# Patient Record
Sex: Male | Born: 1950 | Race: White | Hispanic: No | Marital: Single | Smoking: Current every day smoker
Health system: Southern US, Community
[De-identification: ages and names within clinical notes are randomized; demographics above are authoritative.]

## PROBLEM LIST (undated history)

## (undated) DIAGNOSIS — J45909 Unspecified asthma, uncomplicated: Secondary | ICD-10-CM

## (undated) DIAGNOSIS — C189 Malignant neoplasm of colon, unspecified: Secondary | ICD-10-CM

## (undated) DIAGNOSIS — J449 Chronic obstructive pulmonary disease, unspecified: Secondary | ICD-10-CM

## (undated) DIAGNOSIS — E785 Hyperlipidemia, unspecified: Secondary | ICD-10-CM

## (undated) HISTORY — DX: Malignant neoplasm of colon, unspecified: C18.9

---

## 2010-06-17 ENCOUNTER — Ambulatory Visit: Payer: Self-pay | Admitting: Cardiovascular Disease

## 2012-08-18 ENCOUNTER — Emergency Department: Payer: Self-pay | Admitting: Emergency Medicine

## 2012-08-18 LAB — COMPREHENSIVE METABOLIC PANEL
Albumin: 4.1 g/dL (ref 3.4–5.0)
Alkaline Phosphatase: 88 U/L (ref 50–136)
Anion Gap: 8 (ref 7–16)
BUN: 18 mg/dL (ref 7–18)
Bilirubin,Total: 0.4 mg/dL (ref 0.2–1.0)
Calcium, Total: 9 mg/dL (ref 8.5–10.1)
Chloride: 104 mmol/L (ref 98–107)
Co2: 27 mmol/L (ref 21–32)
Creatinine: 0.82 mg/dL (ref 0.60–1.30)
EGFR (African American): >60
EGFR (Non-African Amer.): >60
Glucose: 68 mg/dL (ref 65–99)
Osmolality: 278 (ref 275–301)
Potassium: 3.7 mmol/L (ref 3.5–5.1)
SGOT(AST): 36 U/L (ref 15–37)
SGPT (ALT): 51 U/L (ref 12–78)
Sodium: 139 mmol/L (ref 136–145)
Total Protein: 8 g/dL (ref 6.4–8.2)

## 2012-08-18 LAB — CBC
HCT: 45.4 % (ref 40.0–52.0)
HGB: 16.1 g/dL (ref 13.0–18.0)
MCH: 31.3 pg (ref 26.0–34.0)
MCHC: 35.5 g/dL (ref 32.0–36.0)
MCV: 88 fL (ref 80–100)
Platelet: 192 10*3/uL (ref 150–440)
RBC: 5.15 10*6/uL (ref 4.40–5.90)
RDW: 14.4 % (ref 11.5–14.5)
WBC: 7.5 10*3/uL (ref 3.8–10.6)

## 2012-08-31 ENCOUNTER — Emergency Department: Payer: Self-pay | Admitting: Emergency Medicine

## 2012-09-14 ENCOUNTER — Ambulatory Visit: Payer: Self-pay | Admitting: Neurology

## 2013-05-24 ENCOUNTER — Emergency Department: Payer: Self-pay | Admitting: Internal Medicine

## 2015-12-02 ENCOUNTER — Emergency Department
Admission: EM | Admit: 2015-12-02 | Discharge: 2015-12-02 | Disposition: A | Discharge status: Home / Self Care | Payer: Medicare Other | Attending: Emergency Medicine | Admitting: Emergency Medicine

## 2015-12-02 ENCOUNTER — Encounter: Payer: Self-pay | Admitting: Emergency Medicine

## 2015-12-02 DIAGNOSIS — Z970 Presence of artificial eye: Secondary | ICD-10-CM | POA: Insufficient documentation

## 2015-12-02 DIAGNOSIS — R51 Headache: Secondary | ICD-10-CM | POA: Diagnosis present

## 2015-12-02 DIAGNOSIS — H5441 Blindness, right eye, normal vision left eye: Secondary | ICD-10-CM | POA: Insufficient documentation

## 2015-12-02 DIAGNOSIS — F17200 Nicotine dependence, unspecified, uncomplicated: Secondary | ICD-10-CM | POA: Diagnosis not present

## 2015-12-02 DIAGNOSIS — R519 Headache, unspecified: Secondary | ICD-10-CM

## 2015-12-02 HISTORY — DX: Hyperlipidemia, unspecified: E78.5

## 2015-12-02 LAB — SEDIMENTATION RATE: Sed Rate: 5 mm/hr (ref 0–20)

## 2015-12-02 LAB — ACETAMINOPHEN LEVEL: Acetaminophen (Tylenol), Serum: <10 ug/mL — ABNORMAL LOW (ref 10–30)

## 2015-12-02 MED ORDER — SODIUM CHLORIDE 0.9 % IV BOLUS (SEPSIS)
1000.0000 mL | Freq: Once | INTRAVENOUS | Status: AC
  Administered 2015-12-02: 1000 mL via INTRAVENOUS

## 2015-12-02 MED ORDER — PROCHLORPERAZINE EDISYLATE 5 MG/ML IJ SOLN
INTRAMUSCULAR | Status: AC
  Administered 2015-12-02: 10 mg via INTRAVENOUS
  Filled 2015-12-02: qty 2

## 2015-12-02 MED ORDER — PROCHLORPERAZINE EDISYLATE 5 MG/ML IJ SOLN
10.0000 mg | Freq: Once | INTRAMUSCULAR | Status: AC
  Administered 2015-12-02: 10 mg via INTRAVENOUS

## 2015-12-02 MED ORDER — KETOROLAC TROMETHAMINE 30 MG/ML IJ SOLN
30.0000 mg | Freq: Once | INTRAMUSCULAR | Status: AC
  Administered 2015-12-02: 30 mg via INTRAVENOUS

## 2015-12-02 MED ORDER — SUMATRIPTAN SUCCINATE 25 MG PO TABS: 25.0000 mg | ORAL_TABLET | ORAL | Status: DC | PRN

## 2015-12-02 MED ORDER — KETOROLAC TROMETHAMINE 30 MG/ML IJ SOLN
INTRAMUSCULAR | Status: AC
  Administered 2015-12-02: 30 mg via INTRAVENOUS
  Filled 2015-12-02: qty 1

## 2015-12-02 NOTE — ED Provider Notes (Signed)
[facility] Emergency Department Provider Note  ____________________________________________  Time seen: Approximately 4 PM  I have reviewed the triage vital signs and the nursing notes.   HISTORY  Chief Complaint Headache    HPI Eric Simon is a 64 y.o. male with a history of hyperlipidemia who is presenting today with a headache over the past 3 months. He says that the headache is a 10 out of 10 at this time and has been persistent. He says it is frontal and radiates to the back of his head. He said that he is already been worked up previously for this and his primary care doctors and had a CAT scan which she said was "normal." He denies any weakness or numbness. 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 He says that he also has some pain when he moves his jaw on both sides. Says that he smokes infrequently and his doctor thinks he may have a history of cluster headaches or migraines. He says his mother had a history of migraines that she developed later in life. There was no mention of a history of brain cancer. No nausea or vomiting. No pain along the temples.Says that the pain is sharp.   Past Medical History  Diagnosis Date  . Hyperlipidemia     There are no active problems to display for this patient.   History reviewed. No pertinent past surgical history.  No current outpatient prescriptions on file.  Allergies Review of patient's allergies indicates no known allergies.  History reviewed. No pertinent family history.  Social History Social History  Substance Use Topics  . Smoking status: Current Every Day Smoker  . Smokeless tobacco: None  . Alcohol Use: No    Review of Systems Constitutional: No fever/chills Eyes: No visual changes. ENT: No sore throat. Cardiovascular: Denies chest pain. Respiratory: Denies  shortness of breath. Gastrointestinal: No abdominal pain.  No nausea, no vomiting.  No diarrhea.  No constipation. Genitourinary: Negative for dysuria. Musculoskeletal: Negative for back pain. Skin: Negative for rash. Neurological: Negative for focal weakness or numbness.  10-point ROS otherwise negative.  ____________________________________________   PHYSICAL EXAM:  VITAL SIGNS: ED Triage Vitals  Enc Vitals Group     BP 12/02/15 1047 147/81 mmHg     Pulse Rate 12/02/15 1047 68     Resp 12/02/15 1047 20     Temp 12/02/15 1047 97.9 F (36.6 C)     Temp Source 12/02/15 1047 Oral     SpO2 12/02/15 1047 97 %     Weight 12/02/15 1047 161 lb (73.029 kg)     Height 12/02/15 1047 5\' 7"  (1.702 m)     Head Cir --      Peak Flow --      Pain Score 12/02/15 1047 10     Pain Loc --      Pain Edu? --      Excl. in [place]? --     Constitutional: Alert and oriented. Well appearing and in no acute distress. Eyes: Conjunctivae are normal. Right eye with prosthetic but the left eye with intact pupillary reflexes as well as extraocular muscles. Head: Atraumatic. Nose: No congestion/rhinnorhea. Mouth/Throat: Mucous membranes are moist.   Neck: No stridor.  Able to range freely without any meningismus. Cardiovascular: Normal rate, regular rhythm. Grossly normal heart sounds.  Good peripheral circulation. Respiratory: Normal  respiratory effort.  No retractions. Lungs CTAB. Gastrointestinal: Soft and nontender. No distention. No abdominal bruits. No CVA tenderness. Musculoskeletal: No lower extremity tenderness nor edema.  No joint effusions. Neurologic:  Normal speech and language. No gross focal neurologic deficits are appreciated. No gait instability. No tenderness to palpation or nodularity along the distribution of the temporal arteries. Able to range his jaw fully without any pain at this time. Skin:  Skin is warm, dry and intact. No rash noted. Psychiatric: Mood and affect are normal.  Speech and behavior are normal.  ____________________________________________   LABS (all labs ordered are listed, but only abnormal results are displayed)  Labs Reviewed  ACETAMINOPHEN LEVEL - Abnormal; Notable for the following:    Acetaminophen (Tylenol), Serum <10 (*)    All other components within normal limits  SEDIMENTATION RATE   ____________________________________________  EKG   ____________________________________________  RADIOLOGY   ____________________________________________   PROCEDURES   ____________________________________________   INITIAL IMPRESSION / ASSESSMENT AND PLAN / ED COURSE  Pertinent labs & imaging results that were available during my care of the patient were reviewed by me and considered in my medical decision making (see chart for details).  ----------------------------------------- 5:26 PM on 12/02/2015 -----------------------------------------  Patient has headache now that is only a 5 out of 10 and says he is feeling much more comfortable. He says the headache is only bad when he coughs at this point. Normal labs reassuring for temporal arteritis. Tylenol was checked because the patient wasn't using a lot of fears that at home without any relief. We'll discharge with Imitrex. Patient up with his primary care doctor. Given recent CAT scan with "normal result "decided not to scan the patient secondary to radiation exposure. Also low suspicion for internal hemorrhage at this time given the duration of the symptoms. Patient was well this time. Understands plan for follow-up and is one to comply. ____________________________________________   FINAL CLINICAL IMPRESSION(S) / ED DIAGNOSES  Acute headache.    Orbie Pyo, MD 12/02/15 [phone]

## 2015-12-02 NOTE — Discharge Instructions (Signed)

## 2015-12-02 NOTE — ED Notes (Signed)
Pt to ed with c/o headache x 2 months.  Pt states he has been seen by md for same but no relief.  Pt reports taking rx for same without relief. Pt reports he is unable to sleep due to the pain.  Denies n/v.  Pt is alert and oriented at this time.

## 2015-12-02 NOTE — ED Notes (Signed)
Pt reports headaches for then last 3 months, pt denies any other symptoms, pt reports pain is worse over the last week

## 2016-02-18 ENCOUNTER — Other Ambulatory Visit: Payer: Self-pay | Admitting: Neurology

## 2016-02-18 DIAGNOSIS — R519 Headache, unspecified: Secondary | ICD-10-CM

## 2016-02-18 DIAGNOSIS — R51 Headache: Principal | ICD-10-CM

## 2016-03-10 ENCOUNTER — Ambulatory Visit
Admission: RE | Admit: 2016-03-10 | Discharge: 2016-03-10 | Disposition: A | Discharge status: Home / Self Care | Payer: Medicare Other | Source: Ambulatory Visit | Attending: Neurology | Admitting: Neurology

## 2016-03-10 DIAGNOSIS — R51 Headache: Secondary | ICD-10-CM | POA: Diagnosis present

## 2016-03-10 DIAGNOSIS — R519 Headache, unspecified: Secondary | ICD-10-CM

## 2016-03-10 DIAGNOSIS — R9082 White matter disease, unspecified: Secondary | ICD-10-CM | POA: Insufficient documentation

## 2016-03-10 MED ORDER — GADOBENATE DIMEGLUMINE 529 MG/ML IV SOLN
15.0000 mL | Freq: Once | INTRAVENOUS | Status: AC | PRN
  Administered 2016-03-10: 15 mL via INTRAVENOUS

## 2016-12-14 ENCOUNTER — Encounter: Payer: Self-pay | Admitting: Emergency Medicine

## 2016-12-14 ENCOUNTER — Emergency Department
Admission: EM | Admit: 2016-12-14 | Discharge: 2016-12-14 | Disposition: A | Discharge status: Home / Self Care | Payer: Medicare Other | Attending: Emergency Medicine | Admitting: Emergency Medicine

## 2016-12-14 DIAGNOSIS — F17200 Nicotine dependence, unspecified, uncomplicated: Secondary | ICD-10-CM | POA: Diagnosis not present

## 2016-12-14 DIAGNOSIS — J449 Chronic obstructive pulmonary disease, unspecified: Secondary | ICD-10-CM | POA: Diagnosis not present

## 2016-12-14 DIAGNOSIS — J0100 Acute maxillary sinusitis, unspecified: Secondary | ICD-10-CM | POA: Diagnosis not present

## 2016-12-14 DIAGNOSIS — J301 Allergic rhinitis due to pollen: Secondary | ICD-10-CM

## 2016-12-14 DIAGNOSIS — R51 Headache: Secondary | ICD-10-CM | POA: Diagnosis present

## 2016-12-14 HISTORY — DX: Chronic obstructive pulmonary disease, unspecified: J44.9

## 2016-12-14 HISTORY — DX: Unspecified asthma, uncomplicated: J45.909

## 2016-12-14 MED ORDER — CETIRIZINE HCL 10 MG PO TABS: 10.0000 mg | ORAL_TABLET | Freq: Every day | ORAL | 0 refills | Status: DC

## 2016-12-14 MED ORDER — AMOXICILLIN 875 MG PO TABS: 875.0000 mg | ORAL_TABLET | Freq: Two times a day (BID) | ORAL | 0 refills | Status: DC

## 2016-12-14 NOTE — ED Triage Notes (Signed)
Pt to ed with c/o headache intermittent x 1 month.  Pt states worse with cough.  Reports congestion, and facial pain.

## 2016-12-14 NOTE — ED Notes (Signed)
See triage note  Having intermittent headaches for several weeks  But now having cough,congestion and pain to face area  Afebrile on arrival

## 2016-12-14 NOTE — ED Provider Notes (Signed)
[facility] Emergency Department Provider Note  ____________________________________________  Time seen: Approximately 11:13 AM  I have reviewed the triage vital signs and the nursing notes.   HISTORY  Chief Complaint Facial Pain    HPI Eric Simon is a 66 y.o. male , NAD, presents to emergency room with one-week history of sinus pain and pressure. States he has chronic issues with his sinuses and allergic rhinitis. Is taking Flonase and Allegra daily but he states he doesn't believe it is been helping. Has had sinus headache, clear nasal drainage and pressure for approximately one month that seemed to worsen over the last week and accompanied by increased thick nasal drainage. States that the headache is not the worst headache of his life and did not have a thunderclap presentation. Denies fevers, chills or body aches. Has had no visual changes, eye discharge, ear pain or drainage nor sore throat. Denies chest pain, shortness breath, cough or chest congestion. Has had no numbness, weakness, tingling.   Past Medical History:  Diagnosis Date  . Asthma   . COPD (chronic obstructive pulmonary disease) ([place])   . Hyperlipidemia     There are no active problems to display for this patient.   History reviewed. No pertinent surgical history.  Prior to Admission medications   Medication Sig Start Date End Date Taking? Authorizing Provider  amoxicillin (AMOXIL) 875 MG tablet Take 1 tablet (875 mg total) by mouth 2 (two) times daily. 12/14/16   Jami L Hagler, PA-C  cetirizine (ZYRTEC) 10 MG tablet Take 1 tablet (10 mg total) by mouth daily. 12/14/16   Jami L Hagler, PA-C  SUMAtriptan (IMITREX) 25 MG tablet Take 1 tablet (25 mg total) by mouth every 2 (two) hours as needed for migraine. May repeat in 2 hours if headache persists or recurs. 12/02/15   Orbie Pyo, MD    Allergies Patient has no known allergies.  History reviewed. No pertinent family  history.  Social History Social History  Substance Use Topics  . Smoking status: Current Every Day Smoker  . Smokeless tobacco: Never Used  . Alcohol use No     Review of Systems  Constitutional: No fever/chills, Fatigue Eyes: No visual changes. No discharge ENT: Positive sinus pressure, nasal congestion. No sore throat, Ear pain, ear drainage. Cardiovascular: No chest pain. Respiratory: No cough, Chest congestion. No shortness of breath. No wheezing.  Gastrointestinal: No abdominal pain.  No nausea, vomiting.   Musculoskeletal: Negative for General myalgias.  Skin: Negative for rash. Neurological: Positive for sinus headaches, but no focal weakness or numbness. 10-point ROS otherwise negative.  ____________________________________________   PHYSICAL EXAM:  VITAL SIGNS: ED Triage Vitals  Enc Vitals Group     BP 12/14/16 1013 (!) 143/81     Pulse Rate 12/14/16 1013 80     Resp 12/14/16 1013 20     Temp 12/14/16 1013 97.9 F (36.6 C)     Temp Source 12/14/16 1013 Oral     SpO2 12/14/16 1013 98 %     Weight 12/14/16 1014 161 lb (73 kg)     Height --      Head Circumference --      Peak Flow --      Pain Score 12/14/16 1014 7     Pain Loc --      Pain Edu? --      Excl. in [place]? --      Constitutional: Alert and oriented. Well appearing and in no acute distress.  Eyes: Conjunctivae are normal Without icterus or injection. Head: Atraumatic. Tenderness to percussion of bilateral maxillary sinuses. ENT:      Ears: TMs visualized bilaterally with mild bulging but no serous effusion, perforation or erythema.      Nose: Moderate congestion with clear rhinorrhea.      Mouth/Throat: Mucous membranes are moist. Pharynx without erythema, swelling, exudate. Uvula is midline. Airway is patent. White postnasal drip. Neck: Supple with full range of motion. Hematological/Lymphatic/Immunilogical: No cervical lymphadenopathy. Cardiovascular: Normal rate, regular rhythm. Normal S1  and S2.  Good peripheral circulation. Respiratory: Normal respiratory effort without tachypnea or retractions. Lungs CTAB with breath sounds noted in all lung fields. No wheeze, rhonchi, rales Neurologic:  Normal speech and language. No gross focal neurologic deficits are appreciated.  Skin:  Skin is warm, dry and intact. No rash noted. Psychiatric: Mood and affect are normal. Speech and behavior are normal. Patient exhibits appropriate insight and judgement.   ____________________________________________   LABS  None ____________________________________________  EKG  None ____________________________________________  RADIOLOGY  None ____________________________________________    PROCEDURES  Procedure(s) performed: None   Procedures   Medications - No data to display   ____________________________________________   INITIAL IMPRESSION / ASSESSMENT AND PLAN / ED COURSE  Pertinent labs & imaging results that were available during my care of the patient were reviewed by me and considered in my medical decision making (see chart for details).  Clinical Course     Patient's diagnosis is consistent with Acute recurrent maxillary sinusitis due to chronic seasonal allergic rhinitis. Patient will be discharged home with prescriptions for amoxicillin and Zyrtec to take as directed. Patient is to continue Flonase as previously prescribed. He may discontinue use of Allegra while on the Zyrtec. Patient is to follow up with his primary care provider if symptoms persist past this treatment course. Patient is given ED precautions to return to the ED for any worsening or new symptoms.    ____________________________________________  FINAL CLINICAL IMPRESSION(S) / ED DIAGNOSES  Final diagnoses:  Acute non-recurrent maxillary sinusitis  Chronic seasonal allergic rhinitis due to pollen      NEW MEDICATIONS STARTED DURING THIS VISIT:  Discharge Medication List as of  12/14/2016 11:48 AM    START taking these medications   Details  amoxicillin (AMOXIL) 875 MG tablet Take 1 tablet (875 mg total) by mouth 2 (two) times daily., Starting Tue 12/14/2016, Print    cetirizine (ZYRTEC) 10 MG tablet Take 1 tablet (10 mg total) by mouth daily., Starting Tue 12/14/2016, [place], PA-C 12/14/16 1245    Lavonia Drafts, MD 12/14/16 1531

## 2018-03-18 ENCOUNTER — Emergency Department: Payer: Medicare Other

## 2018-03-18 ENCOUNTER — Encounter: Payer: Self-pay | Admitting: Emergency Medicine

## 2018-03-18 ENCOUNTER — Emergency Department
Admission: EM | Admit: 2018-03-18 | Discharge: 2018-03-18 | Disposition: A | Discharge status: Home / Self Care | Payer: Medicare Other | Attending: Emergency Medicine | Admitting: Emergency Medicine

## 2018-03-18 DIAGNOSIS — Z79899 Other long term (current) drug therapy: Secondary | ICD-10-CM | POA: Insufficient documentation

## 2018-03-18 DIAGNOSIS — Y9301 Activity, walking, marching and hiking: Secondary | ICD-10-CM | POA: Diagnosis not present

## 2018-03-18 DIAGNOSIS — J45909 Unspecified asthma, uncomplicated: Secondary | ICD-10-CM | POA: Insufficient documentation

## 2018-03-18 DIAGNOSIS — Y929 Unspecified place or not applicable: Secondary | ICD-10-CM | POA: Insufficient documentation

## 2018-03-18 DIAGNOSIS — Y998 Other external cause status: Secondary | ICD-10-CM | POA: Diagnosis not present

## 2018-03-18 DIAGNOSIS — X509XXA Other and unspecified overexertion or strenuous movements or postures, initial encounter: Secondary | ICD-10-CM | POA: Diagnosis not present

## 2018-03-18 DIAGNOSIS — S99912A Unspecified injury of left ankle, initial encounter: Secondary | ICD-10-CM | POA: Diagnosis present

## 2018-03-18 DIAGNOSIS — S93402A Sprain of unspecified ligament of left ankle, initial encounter: Secondary | ICD-10-CM | POA: Diagnosis not present

## 2018-03-18 DIAGNOSIS — F17200 Nicotine dependence, unspecified, uncomplicated: Secondary | ICD-10-CM | POA: Diagnosis not present

## 2018-03-18 MED ORDER — NAPROXEN 375 MG PO TABS: 375.0000 mg | ORAL_TABLET | Freq: Two times a day (BID) | ORAL | 0 refills | Status: DC

## 2018-03-18 NOTE — ED Triage Notes (Signed)
Pt states injury to left foot a few days ago, site with swelling and bruising, denies fall.

## 2018-03-18 NOTE — ED Notes (Addendum)
Pt to ed with c/o left foot and ankle pain.  Pt states he was standing up 2 days ago and his leg "buckled" underneath him and then he had pain.  Severe bruising and swelling noted to left foot and ankle. +pulse, movement and sensation noted in left foot.

## 2018-03-18 NOTE — ED Provider Notes (Signed)
[facility] Emergency Department Provider Note   ____________________________________________   First MD Initiated Contact with Patient 03/18/18 1032     (approximate)  I have reviewed the triage vital signs and the nursing notes.   HISTORY  Chief Complaint Ankle Injury    HPI Eric Simon is a 67 y.o. male pain left foot pain secondary to "rolling" his foot 3 days ago.  Patient state increased pain and edema.  Patient state increased discomfort with ambulation.  Patient rates pain as a 4/10.  Patient described the pain is "aching".  Patient states pain control with Tylenol.  Past Medical History:  Diagnosis Date  . Asthma   . COPD (chronic obstructive pulmonary disease) ([place])   . Hyperlipidemia     There are no active problems to display for this patient.   History reviewed. No pertinent surgical history.  Prior to Admission medications   Medication Sig Start Date End Date Taking? Authorizing Provider  amoxicillin (AMOXIL) 875 MG tablet Take 1 tablet (875 mg total) by mouth 2 (two) times daily. 12/14/16   Hagler, Jami L, PA-C  cetirizine (ZYRTEC) 10 MG tablet Take 1 tablet (10 mg total) by mouth daily. 12/14/16   Hagler, Jami L, PA-C  naproxen (NAPROSYN) 375 MG tablet Take 1 tablet (375 mg total) by mouth 2 (two) times daily with a meal. 03/18/18   Sable Feil, PA-C  SUMAtriptan (IMITREX) 25 MG tablet Take 1 tablet (25 mg total) by mouth every 2 (two) hours as needed for migraine. May repeat in 2 hours if headache persists or recurs. 12/02/15   Schaevitz, Randall An, MD    Allergies Patient has no known allergies.  No family history on file.  Social History Social History   Tobacco Use  . Smoking status: Current Every Day Smoker  . Smokeless tobacco: Never Used  Substance Use Topics  . Alcohol use: No  . Drug use: No    Review of Systems Constitutional: No fever/chills Eyes: No visual changes. ENT: No sore  throat. Cardiovascular: Denies chest pain. Respiratory: Denies shortness of breath. Gastrointestinal: No abdominal pain.  No nausea, no vomiting.  No diarrhea.  No constipation. Genitourinary: Negative for dysuria. Musculoskeletal: Left ankle foot pain Skin: Negative for rash. Neurological: Negative for headaches, focal weakness or numbness. Endocrine:Hyperlipidemia  ____________________________________________   PHYSICAL EXAM:  VITAL SIGNS: ED Triage Vitals  Enc Vitals Group     BP 03/18/18 0937 (!) 149/72     Pulse Rate 03/18/18 0937 88     Resp 03/18/18 0937 16     Temp 03/18/18 0937 98.3 F (36.8 C)     Temp Source 03/18/18 0937 Oral     SpO2 03/18/18 0937 98 %     Weight 03/18/18 0936 162 lb (73.5 kg)     Height 03/18/18 0936 5\' 7"  (1.702 m)     Head Circumference --      Peak Flow --      Pain Score 03/18/18 0936 4     Pain Loc --      Pain Edu? --      Excl. in [place]? --    Constitutional: Alert and oriented. Well appearing and in no acute distress. Cardiovascular: Normal rate, regular rhythm. Grossly normal heart sounds.  Good peripheral circulation. Respiratory: Normal respiratory effort.  No retractions. Lungs CTAB. Musculoskeletal: No obvious deformity to the left foot.  Marked edema but no erythema. Neurologic:  Normal speech and language. No gross focal neurologic deficits  are appreciated. No gait instability. Skin:  Skin is warm, dry and intact. No rash noted. Psychiatric: Mood and affect are normal. Speech and behavior are normal.  ____________________________________________   LABS (all labs ordered are listed, but only abnormal results are displayed)  Labs Reviewed - No data to display ____________________________________________  EKG   ____________________________________________  RADIOLOGY  No acute findings x-ray of the left foot and ankle.  Official radiology report(s): Dg Foot Complete Left  Result Date: 03/18/2018 CLINICAL DATA:  Fall  2 days ago with lateral and dorsal pain of left foot. Bruising and swelling. Initial encounter. EXAM: LEFT FOOT - COMPLETE 3+ VIEW COMPARISON:  None. FINDINGS: There is no evidence of fracture or dislocation. There is no evidence of arthropathy or other focal bone abnormality. Soft tissues are unremarkable. IMPRESSION: Negative. Electronically Signed   By: Aletta Edouard M.D.   On: 03/18/2018 10:59    ____________________________________________   PROCEDURES  Procedure(s) performed: None  Procedures  Critical Care performed: No  ____________________________________________   INITIAL IMPRESSION / ASSESSMENT AND PLAN / ED COURSE  As part of my medical decision making, I reviewed the following data within the electronic MEDICAL RECORD NUMBER    Left foot and ankle pain secondary to sprain.  Discussed x-ray findings with patient.  Patient given discharge care instruction.  Patient placed in a stirrup splint and advised to follow-up PCP as needed.      ____________________________________________   FINAL CLINICAL IMPRESSION(S) / ED DIAGNOSES  Final diagnoses:  Sprain of left ankle, unspecified ligament, initial encounter     ED Discharge Orders        Ordered    naproxen (NAPROSYN) 375 MG tablet  2 times daily with meals     03/18/18 1147       Note:  This document was prepared using Dragon voice recognition software and may include unintentional dictation errors.    Sable Feil, PA-C 03/18/18 [place], [place], MD 03/18/18 [phone]

## 2018-03-18 NOTE — Discharge Instructions (Addendum)
Ankle splint for 3-5 days as needed.

## 2018-10-11 ENCOUNTER — Other Ambulatory Visit: Payer: Self-pay | Admitting: Physician Assistant

## 2018-10-11 DIAGNOSIS — M5412 Radiculopathy, cervical region: Secondary | ICD-10-CM

## 2018-10-12 ENCOUNTER — Other Ambulatory Visit: Payer: Self-pay | Admitting: Physician Assistant

## 2018-10-12 DIAGNOSIS — M5412 Radiculopathy, cervical region: Secondary | ICD-10-CM

## 2018-10-27 ENCOUNTER — Encounter (INDEPENDENT_AMBULATORY_CARE_PROVIDER_SITE_OTHER): Payer: Self-pay

## 2018-10-27 ENCOUNTER — Ambulatory Visit
Admission: RE | Admit: 2018-10-27 | Discharge: 2018-10-27 | Disposition: A | Discharge status: Home / Self Care | Payer: Medicare Other | Source: Ambulatory Visit | Attending: Physician Assistant | Admitting: Physician Assistant

## 2018-10-27 DIAGNOSIS — M2578 Osteophyte, vertebrae: Secondary | ICD-10-CM | POA: Insufficient documentation

## 2018-10-27 DIAGNOSIS — M5412 Radiculopathy, cervical region: Secondary | ICD-10-CM | POA: Diagnosis present

## 2018-10-27 DIAGNOSIS — M4802 Spinal stenosis, cervical region: Secondary | ICD-10-CM | POA: Diagnosis not present

## 2018-10-27 DIAGNOSIS — M50321 Other cervical disc degeneration at C4-C5 level: Secondary | ICD-10-CM | POA: Diagnosis not present

## 2019-05-24 ENCOUNTER — Emergency Department: Payer: Medicare HMO

## 2019-05-24 ENCOUNTER — Emergency Department
Admission: EM | Admit: 2019-05-24 | Discharge: 2019-05-24 | Disposition: A | Discharge status: Home / Self Care | Payer: Medicare HMO | Attending: Emergency Medicine | Admitting: Emergency Medicine

## 2019-05-24 ENCOUNTER — Other Ambulatory Visit: Payer: Self-pay

## 2019-05-24 ENCOUNTER — Encounter: Payer: Self-pay | Admitting: Emergency Medicine

## 2019-05-24 DIAGNOSIS — R591 Generalized enlarged lymph nodes: Secondary | ICD-10-CM

## 2019-05-24 DIAGNOSIS — R0789 Other chest pain: Secondary | ICD-10-CM | POA: Insufficient documentation

## 2019-05-24 DIAGNOSIS — R0602 Shortness of breath: Secondary | ICD-10-CM | POA: Diagnosis not present

## 2019-05-24 DIAGNOSIS — J449 Chronic obstructive pulmonary disease, unspecified: Secondary | ICD-10-CM | POA: Diagnosis not present

## 2019-05-24 DIAGNOSIS — F17200 Nicotine dependence, unspecified, uncomplicated: Secondary | ICD-10-CM | POA: Diagnosis not present

## 2019-05-24 LAB — CBC
HCT: 44.2 % (ref 39.0–52.0)
Hemoglobin: 14.9 g/dL (ref 13.0–17.0)
MCH: 29.7 pg (ref 26.0–34.0)
MCHC: 33.7 g/dL (ref 30.0–36.0)
MCV: 88.2 fL (ref 80.0–100.0)
Platelets: 206 10*3/uL (ref 150–400)
RBC: 5.01 MIL/uL (ref 4.22–5.81)
RDW: 14 % (ref 11.5–15.5)
WBC: 10.3 10*3/uL (ref 4.0–10.5)
nRBC: 0 % (ref 0.0–0.2)

## 2019-05-24 LAB — BASIC METABOLIC PANEL
Anion gap: 9 (ref 5–15)
BUN: 26 mg/dL — ABNORMAL HIGH (ref 8–23)
CO2: 24 mmol/L (ref 22–32)
Calcium: 9 mg/dL (ref 8.9–10.3)
Chloride: 103 mmol/L (ref 98–111)
Creatinine, Ser: 0.9 mg/dL (ref 0.61–1.24)
GFR calc Af Amer: >60 mL/min (ref >60)
GFR calc non Af Amer: >60 mL/min (ref >60)
Glucose, Bld: 104 mg/dL — ABNORMAL HIGH (ref 70–99)
Potassium: 3.9 mmol/L (ref 3.5–5.1)
Sodium: 136 mmol/L (ref 135–145)

## 2019-05-24 LAB — FIBRIN DERIVATIVES D-DIMER (ARMC ONLY): Fibrin derivatives D-dimer (AMRC): 1165.34 ng/mL (FEU) — ABNORMAL HIGH (ref 0.00–499.00)

## 2019-05-24 LAB — TROPONIN I: Troponin I: <0.03 ng/mL (ref <0.03)

## 2019-05-24 MED ORDER — IOHEXOL 350 MG/ML SOLN
75.0000 mL | Freq: Once | INTRAVENOUS | Status: AC | PRN
  Administered 2019-05-24: 75 mL via INTRAVENOUS

## 2019-05-24 MED ORDER — CYCLOBENZAPRINE HCL 10 MG PO TABS
5.0000 mg | ORAL_TABLET | Freq: Once | ORAL | Status: AC
  Administered 2019-05-24: 5 mg via ORAL
  Filled 2019-05-24: qty 1

## 2019-05-24 MED ORDER — CYCLOBENZAPRINE HCL 5 MG PO TABS: 5.0000 mg | ORAL_TABLET | Freq: Three times a day (TID) | ORAL | 0 refills | Status: DC | PRN

## 2019-05-24 NOTE — ED Triage Notes (Signed)
Pt reports last week started with CP and it radiates into his left arm which is numb sometimes. Pt denies injuries but states the pain feels like a pulled muscle. Pt denies SOB or nausea.

## 2019-05-24 NOTE — ED Notes (Signed)
Patient transported to CT 

## 2019-05-24 NOTE — Discharge Instructions (Signed)
As we discussed it is very important that you follow up with oncology to further investigate the findings in your chest CT scan. Please seek medical attention for any high fevers, chest pain, shortness of breath, change in behavior, persistent vomiting, bloody stool or any other new or concerning symptoms.

## 2019-05-24 NOTE — ED Provider Notes (Signed)
[facility] Emergency Department Provider Note   ____________________________________________   I have reviewed the triage vital signs and the nursing notes.   HISTORY  Chief Complaint Chest Pain   History limited by: Not Limited   HPI Eric Simon is a 68 y.o. male who presents to the emergency department today because of concern for left upper chest pain. The pain started roughly 1 week ago. Has gradually been getting worse. The patient states that the pain radiates around his shoulder and into his back. He has not been able to sleep because of the pain. It is worse when he moves positions. He has had some shortness of breath. Denies any fevers.    Records reviewed. Per medical record review patient has a history of COPD, HLD.   Past Medical History:  Diagnosis Date  . Asthma   . COPD (chronic obstructive pulmonary disease) ([place])   . Hyperlipidemia     There are no active problems to display for this patient.   History reviewed. No pertinent surgical history.  Prior to Admission medications   Medication Sig Start Date End Date Taking? Authorizing Provider  amoxicillin (AMOXIL) 875 MG tablet Take 1 tablet (875 mg total) by mouth 2 (two) times daily. 12/14/16   Hagler, Jami L, PA-C  cetirizine (ZYRTEC) 10 MG tablet Take 1 tablet (10 mg total) by mouth daily. 12/14/16   Hagler, Jami L, PA-C  naproxen (NAPROSYN) 375 MG tablet Take 1 tablet (375 mg total) by mouth 2 (two) times daily with a meal. 03/18/18   Sable Feil, PA-C  SUMAtriptan (IMITREX) 25 MG tablet Take 1 tablet (25 mg total) by mouth every 2 (two) hours as needed for migraine. May repeat in 2 hours if headache persists or recurs. 12/02/15   Schaevitz, Randall An, MD    Allergies Patient has no known allergies.  No family history on file.  Social History Social History   Tobacco Use  . Smoking status: Current Every Day Smoker  . Smokeless tobacco: Never Used  Substance Use  Topics  . Alcohol use: No  . Drug use: No    Review of Systems Constitutional: No fever/chills Eyes: No visual changes. ENT: No sore throat. Cardiovascular: Positive for chest pain. Respiratory: Positive for shortness of breath. Gastrointestinal: No abdominal pain.  No nausea, no vomiting.  No diarrhea.   Genitourinary: Negative for dysuria. Musculoskeletal: Negative for back pain. Skin: Negative for rash. Neurological: Negative for headaches, focal weakness or numbness.  ____________________________________________   PHYSICAL EXAM:  VITAL SIGNS: ED Triage Vitals [05/24/19 1244]  Enc Vitals Group     BP      Pulse      Resp      Temp      Temp src      SpO2      Weight 170 lb (77.1 kg)     Height 5\' 7"  (1.702 m)     Head Circumference      Peak Flow      Pain Score 6   Constitutional: Alert and oriented.  Eyes: Conjunctivae are normal.  ENT      Head: Normocephalic and atraumatic.      Nose: No congestion/rhinnorhea.      Mouth/Throat: Mucous membranes are moist.      Neck: No stridor. Hematological/Lymphatic/Immunilogical: No cervical lymphadenopathy. Cardiovascular: Normal rate, regular rhythm.  No murmurs, rubs, or gallops.  Respiratory: Normal respiratory effort without tachypnea nor retractions. Breath sounds are clear and equal bilaterally.  No wheezes/rales/rhonchi. Gastrointestinal: Soft and non tender. No rebound. No guarding.  Genitourinary: Deferred Musculoskeletal: Normal range of motion in all extremities. No lower extremity edema. Neurologic:  Normal speech and language. No gross focal neurologic deficits are appreciated.  Skin:  Skin is warm, dry and intact. No rash noted. Psychiatric: Mood and affect are normal. Speech and behavior are normal. Patient exhibits appropriate insight and judgment.  ____________________________________________    LABS (pertinent positives/negatives)  Trop <0.03 CBC wbc 10.3, hgb 14.9, plt 206 BMP wnl except glu  104, BUN 26 D-dimer 1165 ____________________________________________   EKG  I, Nance Pear, attending physician, personally viewed and interpreted this EKG  EKG Time: 1238 Rate: 87 Rhythm: normal sinus rhythm Axis: left axis deviation Intervals: qtc 457 QRS: narrow ST changes: no st elevation Impression: abnormal ekg   ____________________________________________    RADIOLOGY  CXR Questions small left sided effusion  CT angio PE No PE. Lymphadenopathy.  ____________________________________________   PROCEDURES  Procedures  ____________________________________________   INITIAL IMPRESSION / ASSESSMENT AND PLAN / ED COURSE  Pertinent labs & imaging results that were available during my care of the patient were reviewed by me and considered in my medical decision making (see chart for details).   Patient presented to the emergency department today with left upper chest and some back pain.  Differential would be broad including pneumonia, pneumothorax, PE, dissection, ACS, musculoskeletal pain amongst other etiologies.  Patient's troponin was negative.  I would expect some elevation if the pain is signified ACS given the length of the patient's symptoms.  D-dimer was checked and this was elevated which was then followed by CT angios.  This did not show any PE but did have concerns for possible cancer.  I discussed this finding with the patient.  Discussed importance of following up with oncology.  Terms of the patient's pain do wonder if muscle skeletal cause is the issue.  Will give patient prescription for muscle relaxers.  Discussed with patient/family results of testing/physical exam, differential plan and return precautions.  ____________________________________________   FINAL CLINICAL IMPRESSION(S) / ED DIAGNOSES  Final diagnoses:  Chest wall pain  Lymphadenopathy     Note: This dictation was prepared with Dragon dictation. Any transcriptional errors  that result from this process are unintentional     Nance Pear, MD 05/24/19 2042

## 2019-05-25 ENCOUNTER — Other Ambulatory Visit: Payer: Self-pay

## 2019-05-25 ENCOUNTER — Inpatient Hospital Stay: Payer: Medicare HMO | Attending: Oncology | Admitting: Oncology

## 2019-05-25 ENCOUNTER — Encounter: Payer: Self-pay | Admitting: Oncology

## 2019-05-25 VITALS — BP 109/71 | HR 76 | Temp 98.3°F | Resp 18 | Ht 67.0 in | Wt 149.3 lb

## 2019-05-25 DIAGNOSIS — R918 Other nonspecific abnormal finding of lung field: Secondary | ICD-10-CM | POA: Diagnosis present

## 2019-05-25 DIAGNOSIS — R590 Localized enlarged lymph nodes: Secondary | ICD-10-CM

## 2019-05-25 DIAGNOSIS — R9389 Abnormal findings on diagnostic imaging of other specified body structures: Secondary | ICD-10-CM

## 2019-05-25 DIAGNOSIS — R591 Generalized enlarged lymph nodes: Secondary | ICD-10-CM

## 2019-05-25 NOTE — Progress Notes (Signed)
Pt says that for the last month and half that food does not have a good taste. He states his usual wt. For years is 160 and when he went to ER he was 150 lbs.  He went to ER because he felt he had a pulled muscle and it was hurting him in left chest and left arm. Then the staff worked him up for heart attack and PE and found enlarged lymph nodes

## 2019-05-28 ENCOUNTER — Encounter: Payer: Self-pay | Admitting: Oncology

## 2019-05-28 NOTE — Progress Notes (Signed)
Hematology/Oncology Consult note [facility] [phone] [phone]  Patient Care Team: Jodi Marble, MD as PCP - General (Internal Medicine)   Name of the patient: Eric Simon  [id number]  Nov 27, 1951    Reason for referral- Dr. Archie Balboa   Referring physician- abnormal CT chest  Date of visit: 05/28/19   History of presenting illness- Patient is a 68 yr old male with >40 pack year history of smoking. He currently smokes 0.5ppd. he presented to the ER with symptoms of left sided chest pain and left arm pain. Troponin was negative ekg was unremarkable. Ct chest showed no PE. He was incidentally noted to have mediastinal and retrocrural adenopathy and a 2.1X2.3X4.4 cm retroaortic soft tissue lesion in the posterior left chest. He has been referred for further work up  Currently patient still feels left chest wall is sore and it hurts when he moves his arm. Denies any sob on exerttion  ECOG PS- 0  Pain scale- 5   Review of systems- Review of Systems  Constitutional: Negative for chills, fever, malaise/fatigue and weight loss.  HENT: Negative for congestion, ear discharge and nosebleeds.   Eyes: Negative for blurred vision.  Respiratory: Negative for cough, hemoptysis, sputum production, shortness of breath and wheezing.   Cardiovascular: Negative for chest pain, palpitations, orthopnea and claudication.       Left chest wall soreness  Gastrointestinal: Negative for abdominal pain, blood in stool, constipation, diarrhea, heartburn, melena, nausea and vomiting.  Genitourinary: Negative for dysuria, flank pain, frequency, hematuria and urgency.  Musculoskeletal: Negative for back pain, joint pain and myalgias.  Skin: Negative for rash.  Neurological: Negative for dizziness, tingling, focal weakness, seizures, weakness and headaches.  Endo/Heme/Allergies: Does not bruise/bleed easily.  Psychiatric/Behavioral: Negative for depression  and suicidal ideas. The patient does not have insomnia.     No Known Allergies  There are no active problems to display for this patient.    Past Medical History:  Diagnosis Date  . COPD (chronic obstructive pulmonary disease) ([place])   . Hyperlipidemia      History reviewed. No pertinent surgical history.  Social History   Socioeconomic History  . Marital status: Single    Spouse name: Not on file  . Number of children: Not on file  . Years of education: Not on file  . Highest education level: Not on file  Occupational History  . Not on file  Social Needs  . Financial resource strain: Not on file  . Food insecurity    Worry: Not on file    Inability: Not on file  . Transportation needs    Medical: Not on file    Non-medical: Not on file  Tobacco Use  . Smoking status: Current Every Day Smoker    Packs/day: 0.50  . Smokeless tobacco: Never Used  . Tobacco comment: smoking 40 years  Substance and Sexual Activity  . Alcohol use: No  . Drug use: No  . Sexual activity: Not Currently  Lifestyle  . Physical activity    Days per week: Not on file    Minutes per session: Not on file  . Stress: Not on file  Relationships  . Social Herbalist on phone: Not on file    Gets together: Not on file    Attends religious service: Not on file    Active member of club or organization: Not on file    Attends meetings of clubs or organizations: Not on file  Relationship status: Not on file  . Intimate partner violence    Fear of current or ex partner: Not on file    Emotionally abused: Not on file    Physically abused: Not on file    Forced sexual activity: Not on file  Other Topics Concern  . Not on file  Social History Narrative  . Not on file     Family History  Problem Relation Age of Onset  . Brain cancer Father      Current Outpatient Medications:  .  aspirin EC 81 MG tablet, Take 81 mg by mouth daily., Disp: , Rfl:  .  cetirizine (ZYRTEC) 10 MG  tablet, Take 1 tablet (10 mg total) by mouth daily., Disp: 30 tablet, Rfl: 0 .  simvastatin (ZOCOR) 20 MG tablet, Take 20 mg by mouth daily., Disp: , Rfl:  .  cyclobenzaprine (FLEXERIL) 5 MG tablet, Take 1 tablet (5 mg total) by mouth 3 (three) times daily as needed for muscle spasms. (Patient not taking: Reported on 05/25/2019), Disp: 10 tablet, Rfl: 0 .  naproxen (NAPROSYN) 375 MG tablet, Take 1 tablet (375 mg total) by mouth 2 (two) times daily with a meal. (Patient not taking: Reported on 05/25/2019), Disp: 10 tablet, Rfl: 0   Physical exam:  Vitals:   05/25/19 1514  BP: 109/71  Pulse: 76  Resp: 18  Temp: 98.3 F (36.8 C)  TempSrc: Oral  Weight: 149 lb 4.8 oz (67.7 kg)  Height: 5\' 7"  (1.702 m)   Physical Exam HENT:     Head: Normocephalic and atraumatic.  Eyes:     Pupils: Pupils are equal, round, and reactive to light.  Neck:     Musculoskeletal: Normal range of motion.  Cardiovascular:     Rate and Rhythm: Normal rate and regular rhythm.     Heart sounds: Normal heart sounds.  Pulmonary:     Effort: Pulmonary effort is normal.     Breath sounds: Normal breath sounds.  Abdominal:     General: Bowel sounds are normal.     Palpations: Abdomen is soft.  Musculoskeletal:     Right lower leg: No edema.     Left lower leg: No edema.     Comments: thers is mild TTP over left chest wall and pain on left arm movement  Lymphadenopathy:     Comments: No palpable cervical, supraclavicular, axillary or inguinal adenopathy   Skin:    General: Skin is warm and dry.  Neurological:     Mental Status: He is alert and oriented to person, place, and time.        CMP Latest Ref Rng & Units 05/24/2019  Glucose 70 - 99 mg/dL 104(H)  BUN 8 - 23 mg/dL 26(H)  Creatinine 0.61 - 1.24 mg/dL 0.90  Sodium 135 - 145 mmol/L 136  Potassium 3.5 - 5.1 mmol/L 3.9  Chloride 98 - 111 mmol/L 103  CO2 22 - 32 mmol/L 24  Calcium 8.9 - 10.3 mg/dL 9.0  Total Protein 6.4 - 8.2 g/dL -  Total  Bilirubin 0.2 - 1.0 mg/dL -  Alkaline Phos 50 - 136 Unit/L -  AST 15 - 37 Unit/L -  ALT 12 - 78 U/L -   CBC Latest Ref Rng & Units 05/24/2019  WBC 4.0 - 10.5 K/uL 10.3  Hemoglobin 13.0 - 17.0 g/dL 14.9  Hematocrit 39.0 - 52.0 % 44.2  Platelets 150 - 400 K/uL 206    No images are attached to the encounter.  Dg  Chest 2 View  Result Date: 05/24/2019 CLINICAL DATA:  Chest pain. EXAM: CHEST - 2 VIEW COMPARISON:  None. FINDINGS: Lungs are clear without airspace disease or pulmonary edema. Lung markings are mildly coarse and could represent chronic changes. Mild blunting at the left costophrenic angle. Heart and mediastinum are within normal limits. Bone structures are unremarkable. IMPRESSION: Mild blunting at the left costophrenic angle. Findings could represent scarring versus tiny effusion. Otherwise, no acute lung findings. Electronically Signed   By: Markus Daft M.D.   On: 05/24/2019 13:55   Ct Angio Chest Pe W And/or Wo Contrast  Result Date: 05/24/2019 CLINICAL DATA:  Chest pain radiates to left arm. EXAM: CT ANGIOGRAPHY CHEST WITH CONTRAST TECHNIQUE: Multidetector CT imaging of the chest was performed using the standard protocol during bolus administration of intravenous contrast. Multiplanar CT image reconstructions and MIPs were obtained to evaluate the vascular anatomy. CONTRAST:  29mL OMNIPAQUE IOHEXOL 350 MG/ML SOLN COMPARISON:  None. FINDINGS: Cardiovascular: The heart size is normal. No substantial pericardial effusion. Coronary artery calcification is evident. Atherosclerotic calcification is noted in the wall of the thoracic aorta. No filling defect within the opacified pulmonary arteries to suggest the presence of an acute pulmonary embolus. Mediastinum/Nodes: 11 mm short axis AP window lymph node visible on 37/4. 12 mm short axis subcarinal lymph node evident. 8 mm short axis left hilar lymph node visible on 49/4. A retroaortic ill-defined soft tissue lesion is identified in the left  paraspinal chest measuring 2.1 x 2.3 x 4.4 cm (image 40/4). This is contiguous with the descending thoracic aorta. A 12 mm short axis low paraesophageal node is visible on 83/4 with small retrocrural lymph nodes associated. Lungs/Pleura: Centrilobular and paraseptal emphysema evident. Probable dependent atelectasis in the lower lungs bilaterally. Dependent collapse/consolidation noted posterior left lower lobe with tiny left pleural effusion. Upper Abdomen: Unremarkable. Musculoskeletal: No worrisome lytic or sclerotic osseous abnormality. Review of the MIP images confirms the above findings. IMPRESSION: 1. No CT evidence for acute pulmonary embolus. 2. Borderline to mild mediastinal and retrocrural lymphadenopathy associated with a 2.1 x 2.3 x 4.4 cm ill-defined retroaortic soft tissue lesion in the posterior left chest. Metastatic disease and lymphoma cannot be excluded. PET-CT likely warranted to further evaluate. 3. Dependent collapse/consolidation in the posterior left lower lobe with tiny left pleural effusion. Electronically Signed   By: Misty Stanley M.D.   On: 05/24/2019 18:58    Assessment and plan- Patient is a 68 y.o. male referred by ER for mediastinal adenopathy and retroaortic soft tissue mass concerning for malignancy  I have reviewed ct  chest images independently and discussed findings with patient. Patient noted to have mediastinal and retrocrural adenopathy as well as retroaortic soft tissue mass concerning for malignancy. Given his h/o smoking, lung cancer remains a possibility.   I will proceed with pet ct scan at this time and after the results are back, discuss with IR if CT guided biopsy would be possible from a posterior approach.   I will tentively see him back in 2 weeks time   Thank you for this kind referral and the opportunity to participate in the care of this  Patient   Visit Diagnosis 1. Lymphadenopathy   2. Abnormal CT of the chest     Dr. Randa Evens, MD, MPH  [facility] at [facility] [phone] 05/28/2019  11:43 AM

## 2019-05-30 ENCOUNTER — Ambulatory Visit: Payer: Medicare HMO

## 2019-05-31 ENCOUNTER — Telehealth: Payer: Self-pay | Admitting: *Deleted

## 2019-05-31 NOTE — Telephone Encounter (Signed)
Called patient after I got a message that the pet scan was still pending the approval from insurance and now the hospital is requiring 96 hours prior to scan if insurance has not approved the scan it has to be r/s.  The only date next week is wed., and if I put him on then in the morning would start 96 hours and the pet scan is not working on Friday so I had to [facility] at [facility] and Nothing to eat or drink 6 hours prior to scan. I have also changed the md follow up because of pet scan being r/s.  It is now 7/7 at 2;15.  I have given pt. My direct number to call me if he has questions.

## 2019-06-01 ENCOUNTER — Telehealth: Payer: Self-pay | Admitting: *Deleted

## 2019-06-01 NOTE — Telephone Encounter (Signed)
Called pt. And let him know that pet has been approved and I had it moved up if ok with pt. And he is agreeable. The appt is 6/24 at 12:30. Nothing to eat or drink for 6 hours prior . Can have water if he wants. [facility]. I have changed the appt to see md 7/2 1:45 pm. He is agreeable to the above and has instructions for pet above.

## 2019-06-04 ENCOUNTER — Ambulatory Visit: Payer: Medicare HMO

## 2019-06-05 ENCOUNTER — Inpatient Hospital Stay: Payer: Medicare HMO | Admitting: Oncology

## 2019-06-06 ENCOUNTER — Ambulatory Visit
Admission: RE | Admit: 2019-06-06 | Discharge: 2019-06-06 | Disposition: A | Discharge status: Home / Self Care | Payer: Medicare HMO | Source: Ambulatory Visit | Attending: Oncology | Admitting: Oncology

## 2019-06-06 ENCOUNTER — Other Ambulatory Visit: Payer: Self-pay

## 2019-06-06 DIAGNOSIS — R9389 Abnormal findings on diagnostic imaging of other specified body structures: Secondary | ICD-10-CM | POA: Insufficient documentation

## 2019-06-06 DIAGNOSIS — I700 Atherosclerosis of aorta: Secondary | ICD-10-CM | POA: Insufficient documentation

## 2019-06-06 DIAGNOSIS — R591 Generalized enlarged lymph nodes: Secondary | ICD-10-CM

## 2019-06-06 DIAGNOSIS — F17200 Nicotine dependence, unspecified, uncomplicated: Secondary | ICD-10-CM | POA: Diagnosis not present

## 2019-06-06 DIAGNOSIS — J439 Emphysema, unspecified: Secondary | ICD-10-CM | POA: Insufficient documentation

## 2019-06-06 LAB — GLUCOSE, CAPILLARY: Glucose-Capillary: 79 mg/dL (ref 70–99)

## 2019-06-06 MED ORDER — FLUDEOXYGLUCOSE F - 18 (FDG) INJECTION
7.7000 | Freq: Once | INTRAVENOUS | Status: AC | PRN
  Administered 2019-06-06: 8.2 via INTRAVENOUS

## 2019-06-07 ENCOUNTER — Other Ambulatory Visit: Payer: Self-pay | Admitting: *Deleted

## 2019-06-07 DIAGNOSIS — R591 Generalized enlarged lymph nodes: Secondary | ICD-10-CM

## 2019-06-07 DIAGNOSIS — R948 Abnormal results of function studies of other organs and systems: Secondary | ICD-10-CM

## 2019-06-08 ENCOUNTER — Telehealth: Payer: Self-pay | Admitting: *Deleted

## 2019-06-08 NOTE — Telephone Encounter (Signed)
Patient called reporting that he got a call from the hospital about a biopsy appointment and he is asking why no one from the office called him about a biopsy

## 2019-06-08 NOTE — Telephone Encounter (Signed)
Called patient and explained everything.

## 2019-06-11 ENCOUNTER — Other Ambulatory Visit: Payer: Self-pay | Admitting: Student

## 2019-06-11 ENCOUNTER — Ambulatory Visit: Payer: Medicare HMO

## 2019-06-12 ENCOUNTER — Ambulatory Visit
Admission: RE | Admit: 2019-06-12 | Discharge: 2019-06-12 | Disposition: A | Discharge status: Home / Self Care | Payer: Medicare HMO | Source: Ambulatory Visit | Attending: Oncology | Admitting: Oncology

## 2019-06-12 ENCOUNTER — Other Ambulatory Visit: Payer: Self-pay

## 2019-06-12 DIAGNOSIS — R948 Abnormal results of function studies of other organs and systems: Secondary | ICD-10-CM | POA: Diagnosis not present

## 2019-06-12 DIAGNOSIS — Z79899 Other long term (current) drug therapy: Secondary | ICD-10-CM | POA: Insufficient documentation

## 2019-06-12 DIAGNOSIS — Z7982 Long term (current) use of aspirin: Secondary | ICD-10-CM | POA: Diagnosis not present

## 2019-06-12 DIAGNOSIS — E785 Hyperlipidemia, unspecified: Secondary | ICD-10-CM | POA: Insufficient documentation

## 2019-06-12 DIAGNOSIS — F17210 Nicotine dependence, cigarettes, uncomplicated: Secondary | ICD-10-CM | POA: Diagnosis not present

## 2019-06-12 DIAGNOSIS — R591 Generalized enlarged lymph nodes: Secondary | ICD-10-CM | POA: Diagnosis present

## 2019-06-12 DIAGNOSIS — J449 Chronic obstructive pulmonary disease, unspecified: Secondary | ICD-10-CM | POA: Insufficient documentation

## 2019-06-12 LAB — CBC
HCT: 44.4 % (ref 39.0–52.0)
Hemoglobin: 14.8 g/dL (ref 13.0–17.0)
MCH: 29.8 pg (ref 26.0–34.0)
MCHC: 33.3 g/dL (ref 30.0–36.0)
MCV: 89.5 fL (ref 80.0–100.0)
Platelets: 275 10*3/uL (ref 150–400)
RBC: 4.96 MIL/uL (ref 4.22–5.81)
RDW: 13.5 % (ref 11.5–15.5)
WBC: 8.4 10*3/uL (ref 4.0–10.5)
nRBC: 0 % (ref 0.0–0.2)

## 2019-06-12 LAB — PROTIME-INR
INR: 0.9 (ref 0.8–1.2)
Prothrombin Time: 12.3 seconds (ref 11.4–15.2)

## 2019-06-12 MED ORDER — FENTANYL CITRATE (PF) 100 MCG/2ML IJ SOLN
INTRAMUSCULAR | Status: AC | PRN
  Administered 2019-06-12 (×2): 50 ug via INTRAVENOUS

## 2019-06-12 MED ORDER — FENTANYL CITRATE (PF) 100 MCG/2ML IJ SOLN
INTRAMUSCULAR | Status: AC
  Filled 2019-06-12: qty 2

## 2019-06-12 MED ORDER — MIDAZOLAM HCL 2 MG/2ML IJ SOLN
INTRAMUSCULAR | Status: AC | PRN
  Administered 2019-06-12: 1 mg via INTRAVENOUS
  Administered 2019-06-12: 2 mg via INTRAVENOUS

## 2019-06-12 MED ORDER — SODIUM CHLORIDE 0.9 % IV SOLN
INTRAVENOUS | Status: DC
  Administered 2019-06-12: 08:00:00 via INTRAVENOUS

## 2019-06-12 MED ORDER — MIDAZOLAM HCL 5 MG/5ML IJ SOLN
INTRAMUSCULAR | Status: AC
  Filled 2019-06-12: qty 5

## 2019-06-12 NOTE — Progress Notes (Signed)
Patient  Clinically stable post Retroperitoneal biopsy per Dr Register, tolerated well. Vitals stable. Dr Register out to speak with patient with questions answered. Denies complaints. Taking po;'s without difficulty.

## 2019-06-12 NOTE — H&P (Signed)
Chief Complaint: Patient was seen in consultation today for No chief complaint on file.  at the request of Cecil C  Referring Physician(s): Rao,Archana C  Supervising Physician: {Tom Register Patient Status: [facility] - Out-pt  History of Present Illness: Eric Simon is a 68 y.o. male in for retroperitoneal bx.  Past Medical History:  Diagnosis Date   COPD (chronic obstructive pulmonary disease) ([place])    Hyperlipidemia     History reviewed. No pertinent surgical history.  Allergies: Patient has no known allergies.  Medications: Prior to Admission medications   Medication Sig Start Date End Date Taking? Authorizing Provider  aspirin EC 81 MG tablet Take 81 mg by mouth daily.   Yes [provider]  simvastatin (ZOCOR) 20 MG tablet Take 20 mg by mouth daily.   Yes [provider]  cetirizine (ZYRTEC) 10 MG tablet Take 1 tablet (10 mg total) by mouth daily. Patient not taking: Reported on 06/12/2019 12/14/16   Hagler, Jami L, PA-C  cyclobenzaprine (FLEXERIL) 5 MG tablet Take 1 tablet (5 mg total) by mouth 3 (three) times daily as needed for muscle spasms. Patient not taking: Reported on 05/25/2019 05/24/19   Nance Pear, MD  naproxen (NAPROSYN) 375 MG tablet Take 1 tablet (375 mg total) by mouth 2 (two) times daily with a meal. Patient not taking: Reported on 05/25/2019 03/18/18   Sable Feil, PA-C     Family History  Problem Relation Age of Onset   Brain cancer Father     Social History   Socioeconomic History   Marital status: Single    Spouse name: Not on file   Number of children: Not on file   Years of education: Not on file   Highest education level: Not on file  Occupational History   Not on file  Social Needs   Financial resource strain: Not hard at all   Food insecurity    Worry: Never true    Inability: Never true   Transportation needs    Medical: No    Non-medical: No  Tobacco Use   Smoking status:  Current Every Day Smoker    Packs/day: 0.50   Smokeless tobacco: Never Used   Tobacco comment: smoking 40 years  Substance and Sexual Activity   Alcohol use: No   Drug use: No   Sexual activity: Not Currently  Lifestyle   Physical activity    Days per week: Not on file    Minutes per session: Not on file   Stress: Not at all  Relationships   Social connections    Talks on phone: More than three times a week    Gets together: Not on file    Attends religious service: Not on file    Active member of club or organization: Not on file    Attends meetings of clubs or organizations: Not on file    Relationship status: Not on file  Other Topics Concern   Not on file  Social History Narrative   Not on file      Review of Systems: A 12 point ROS discussed and pertinent positives are indicated in the HPI above.  All other systems are negative.  Review of Systems  Vital Signs: BP 136/83    Pulse 75    Temp 98 F (36.7 C) (Oral)    Ht 5\' 7"  (1.702 m)    Wt 67.6 kg    SpO2 99%    BMI 23.34 kg/m   Physical Exam  Imaging: Dg Chest 2 View  Result Date: 05/24/2019 CLINICAL DATA:  Chest pain. EXAM: CHEST - 2 VIEW COMPARISON:  None. FINDINGS: Lungs are clear without airspace disease or pulmonary edema. Lung markings are mildly coarse and could represent chronic changes. Mild blunting at the left costophrenic angle. Heart and mediastinum are within normal limits. Bone structures are unremarkable. IMPRESSION: Mild blunting at the left costophrenic angle. Findings could represent scarring versus tiny effusion. Otherwise, no acute lung findings. Electronically Signed   By: Markus Daft M.D.   On: 05/24/2019 13:55   Ct Angio Chest Pe W And/or Wo Contrast  Result Date: 05/24/2019 CLINICAL DATA:  Chest pain radiates to left arm. EXAM: CT ANGIOGRAPHY CHEST WITH CONTRAST TECHNIQUE: Multidetector CT imaging of the chest was performed using the standard protocol during bolus administration  of intravenous contrast. Multiplanar CT image reconstructions and MIPs were obtained to evaluate the vascular anatomy. CONTRAST:  18mL OMNIPAQUE IOHEXOL 350 MG/ML SOLN COMPARISON:  None. FINDINGS: Cardiovascular: The heart size is normal. No substantial pericardial effusion. Coronary artery calcification is evident. Atherosclerotic calcification is noted in the wall of the thoracic aorta. No filling defect within the opacified pulmonary arteries to suggest the presence of an acute pulmonary embolus. Mediastinum/Nodes: 11 mm short axis AP window lymph node visible on 37/4. 12 mm short axis subcarinal lymph node evident. 8 mm short axis left hilar lymph node visible on 49/4. A retroaortic ill-defined soft tissue lesion is identified in the left paraspinal chest measuring 2.1 x 2.3 x 4.4 cm (image 40/4). This is contiguous with the descending thoracic aorta. A 12 mm short axis low paraesophageal node is visible on 83/4 with small retrocrural lymph nodes associated. Lungs/Pleura: Centrilobular and paraseptal emphysema evident. Probable dependent atelectasis in the lower lungs bilaterally. Dependent collapse/consolidation noted posterior left lower lobe with tiny left pleural effusion. Upper Abdomen: Unremarkable. Musculoskeletal: No worrisome lytic or sclerotic osseous abnormality. Review of the MIP images confirms the above findings. IMPRESSION: 1. No CT evidence for acute pulmonary embolus. 2. Borderline to mild mediastinal and retrocrural lymphadenopathy associated with a 2.1 x 2.3 x 4.4 cm ill-defined retroaortic soft tissue lesion in the posterior left chest. Metastatic disease and lymphoma cannot be excluded. PET-CT likely warranted to further evaluate. 3. Dependent collapse/consolidation in the posterior left lower lobe with tiny left pleural effusion. Electronically Signed   By: Misty Stanley M.D.   On: 05/24/2019 18:58   Nm Pet Image Initial (pi) Skull Base To Thigh  Result Date: 06/06/2019 CLINICAL DATA:   Initial treatment strategy for thoracic and upper abdominal adenopathy. EXAM: NUCLEAR MEDICINE PET SKULL BASE TO THIGH TECHNIQUE: 8.2 mCi F-18 FDG was injected intravenously. Full-ring PET imaging was performed from the skull base to thigh after the radiotracer. CT data was obtained and used for attenuation correction and anatomic localization. Fasting blood glucose: 79 mg/dl COMPARISON:  Chest CT 05/24/2019 FINDINGS: Mediastinal blood pool activity: SUV max 2.2 Liver activity: SUV max 3.7 NECK: No significant abnormal hypermetabolic activity in this region. Incidental CT findings: Prosthetic right globe. Mild right common carotid artery atherosclerotic calcification. CHEST: Mildly hypermetabolic left supraclavicular, upper mediastinal prevascular, and periaortic adenopathy is identified. Left supraclavicular node 0.7 cm in short axis on image 66/3, maximum SUV 3.1 (Deauville 3). Prevascular node in the upper mediastinum measures 0.9 cm in short axis the indistinctly marginated left posterior periaortic lesion measuring 1.2 by 1.6 cm has maximum SUV of 3.4, Deauville 3. Faintly accentuated left hilar activity observed, maximum SUV 3.4, Deauville 3. Just  posterior to the esophagus as it enters the hiatus, a lower thoracic para aortic lymph node measures 1.3 cm in short axis on image 139/3, maximum SUV 6.7, Deauville 4. There is also a right retro diaphragmatic lymph node along the pleural margin measuring 0.7 cm in short axis on image 137/3 with maximum SUV 3.5, Deauville 3. Incidental CT findings: Centrilobular emphysema. ABDOMEN/PELVIS: Hypermetabolic retroperitoneal, bilateral common iliac, and left external iliac adenopathy with multiple enlarged and hypermetabolic lymph nodes observed. An index aortocaval node measuring 1.9 cm in short axis on image 173/3 has a maximum SUV of 9.4. A left common iliac node at about the level of the bifurcation measures 1.6 cm in short axis 8.3. A left external iliac node  measuring 2.0 cm in short axis on image 231/3 has a maximum SUV of 7.8. These nodes are essentially borderline between Deauville 4 and Deauville 5. Vague activity in the right lower quadrant does not have a CT correlate and probably represents a physiologic bowel activity, less likely to be some occult mesenteric nodal activity. There is subtle activity along the genu of the left adrenal gland with maximum SUV 4.6, possibly due to an adjacent small hypermetabolic lymph node but technically nonspecific. Incidental CT findings: Aortoiliac atherosclerotic vascular disease. No splenomegaly or splenic hypermetabolic activity. SKELETON: Focally accentuated activity in the left lateral fifth rib (maximum SUV 4.7) and left sixth rib (maximum SUV 4.6) associated with nondisplaced rib fractures. Incidental CT findings: none IMPRESSION: 1. Pathologic retroperitoneal, pelvic, and thoracic adenopathy favoring malignancy such as lymphoma. Thoracic adenopathy is primarily Deauville 3 activity, with the larger lymph nodes in the abdomen and pelvis along the borderline between Deauville 4 and Deauville 5 activity. 2. Low-grade activity focally in the left lateral fifth and sixth ribs associated with nondisplaced rib fractures, likely reflecting healing response rather than malignancy. 3. Aortic Atherosclerosis (ICD10-I70.0) and Emphysema (ICD10-J43.9). Electronically Signed   By: Van Clines M.D.   On: 06/06/2019 16:58    Labs:  CBC: Recent Labs    05/24/19 1246 06/12/19 0804  WBC 10.3 8.4  HGB 14.9 14.8  HCT 44.2 44.4  PLT 206 275    COAGS: No results for input(s): INR, APTT in the last 8760 hours.  BMP: Recent Labs    05/24/19 1246  NA 136  K 3.9  CL 103  CO2 24  GLUCOSE 104*  BUN 26*  CALCIUM 9.0  CREATININE 0.90  GFRNONAA >60  GFRAA >60    LIVER FUNCTION TESTS: No results for input(s): BILITOT, AST, ALT, ALKPHOS, PROT, ALBUMIN in the last 8760 hours.  TUMOR MARKERS: No results for  input(s): AFPTM, CEA, CA199, CHROMGRNA in the last 8760 hours.  Assessment and Plan:  CT Bx retroperitoneal lymph node.  Thank you for this interesting consult.  I greatly enjoyed meeting Eric Simon and look forward to participating in their care.  A copy of this report was sent to the requesting provider on this date.  Electronically Signed: Register, Melanie Crazier, MD 06/12/2019, 8:37 AM   I spent a total of  15 Minutes   in face to face in clinical consultation, greater than 50% of which was counseling/coordinating care for the pt.

## 2019-06-12 NOTE — Discharge Instructions (Signed)
Moderate Conscious Sedation, Adult, Care After °These instructions provide you with information about caring for yourself after your procedure. Your health care provider may also give you more specific instructions. Your treatment has been planned according to current medical practices, but problems sometimes occur. Call your health care provider if you have any problems or questions after your procedure. °What can I expect after the procedure? °After your procedure, it is common: °· To feel sleepy for several hours. °· To feel clumsy and have poor balance for several hours. °· To have poor judgment for several hours. °· To vomit if you eat too soon. °Follow these instructions at home: °For at least 24 hours after the procedure: ° °· Do not: °? Participate in activities where you could fall or become injured. °? Drive. °? Use heavy machinery. °? Drink alcohol. °? Take sleeping pills or medicines that cause drowsiness. °? Make important decisions or sign legal documents. °? Take care of children on your own. °· Rest. °Eating and drinking °· Follow the diet recommended by your health care provider. °· If you vomit: °? Drink water, juice, or soup when you can drink without vomiting. °? Make sure you have little or no nausea before eating solid foods. °General instructions °· Have a responsible adult stay with you until you are awake and alert. °· Take over-the-counter and prescription medicines only as told by your health care provider. °· If you smoke, do not smoke without supervision. °· Keep all follow-up visits as told by your health care provider. This is important. °Contact a health care provider if: °· You keep feeling nauseous or you keep vomiting. °· You feel light-headed. °· You develop a rash. °· You have a fever. °Get help right away if: °· You have trouble breathing. °This information is not intended to replace advice given to you by your health care provider. Make sure you discuss any questions you have  with your health care provider. °Document Released: 09/19/2013 Document Revised: 11/11/2017 Document Reviewed: 03/20/2016 °Elsevier Patient Education © 2020 [employer] °Needle Biopsy, Care After °These instructions tell you how to care for yourself after your procedure. Your doctor may also give you more specific instructions. Call your doctor if you have any problems or questions. °What can I expect after the procedure? °After the procedure, it is common to have: °· Soreness. °· Bruising. °· Mild pain. °Follow these instructions at home: ° °· Return to your normal activities as told by your doctor. Ask your doctor what activities are safe for you. °· Take over-the-counter and prescription medicines only as told by your doctor. °· Wash your hands with soap and water before you change your bandage (dressing). If you cannot use soap and water, use hand sanitizer. °· Follow instructions from your doctor about: °? How to take care of your puncture site. °? When and how to change your bandage. °? When to remove your bandage. °· Check your puncture site every day for signs of infection. Watch for: °? Redness, swelling, or pain. °? Fluid or blood.  °? Pus or a bad smell. °? Warmth. °· Do not take baths, swim, or use a hot tub until your doctor approves. Ask your doctor if you may take showers. You may only be allowed to take sponge baths. °· Keep all follow-up visits as told by your doctor. This is important. °Contact a doctor if you have: °· A fever. °· Redness, swelling, or pain at the puncture site, and it lasts longer than a few   days. °· Fluid, blood, or pus coming from the puncture site. °· Warmth coming from the puncture site. °Get help right away if: °· You have a lot of bleeding from the puncture site. °Summary °· After the procedure, it is common to have soreness, bruising, or mild pain at the puncture site. °· Check your puncture site every day for signs of infection, such as redness, swelling, or pain. °· Get  help right away if you have severe bleeding from your puncture site. °This information is not intended to replace advice given to you by your health care provider. Make sure you discuss any questions you have with your health care provider. °Document Released: 11/11/2008 Document Revised: 12/12/2017 Document Reviewed: 12/12/2017 °Elsevier Patient Education © 2020 [employer] ° °

## 2019-06-12 NOTE — Progress Notes (Signed)
Pt stable after bx.VSS Back stable.D/C instructions given.F/U with his MD.

## 2019-06-14 ENCOUNTER — Other Ambulatory Visit: Payer: Self-pay | Admitting: Pathology

## 2019-06-14 ENCOUNTER — Ambulatory Visit: Payer: Medicare HMO | Admitting: Oncology

## 2019-06-19 ENCOUNTER — Other Ambulatory Visit: Payer: Self-pay

## 2019-06-19 ENCOUNTER — Inpatient Hospital Stay: Payer: Medicare HMO | Attending: Oncology | Admitting: Oncology

## 2019-06-19 ENCOUNTER — Encounter: Payer: Self-pay | Admitting: Oncology

## 2019-06-19 ENCOUNTER — Telehealth: Payer: Self-pay | Admitting: *Deleted

## 2019-06-19 ENCOUNTER — Telehealth: Payer: Self-pay

## 2019-06-19 ENCOUNTER — Ambulatory Visit: Payer: Medicare HMO | Admitting: Oncology

## 2019-06-19 ENCOUNTER — Ambulatory Visit: Payer: Medicare HMO

## 2019-06-19 VITALS — BP 129/72 | HR 89 | Temp 97.9°F | Resp 18 | Ht 67.0 in | Wt 148.7 lb

## 2019-06-19 DIAGNOSIS — C772 Secondary and unspecified malignant neoplasm of intra-abdominal lymph nodes: Secondary | ICD-10-CM | POA: Diagnosis not present

## 2019-06-19 DIAGNOSIS — C786 Secondary malignant neoplasm of retroperitoneum and peritoneum: Secondary | ICD-10-CM | POA: Diagnosis not present

## 2019-06-19 DIAGNOSIS — Z452 Encounter for adjustment and management of vascular access device: Secondary | ICD-10-CM | POA: Insufficient documentation

## 2019-06-19 DIAGNOSIS — C189 Malignant neoplasm of colon, unspecified: Secondary | ICD-10-CM | POA: Diagnosis present

## 2019-06-19 DIAGNOSIS — Z5112 Encounter for antineoplastic immunotherapy: Secondary | ICD-10-CM | POA: Insufficient documentation

## 2019-06-19 DIAGNOSIS — Z5111 Encounter for antineoplastic chemotherapy: Secondary | ICD-10-CM | POA: Diagnosis present

## 2019-06-19 DIAGNOSIS — Z7189 Other specified counseling: Secondary | ICD-10-CM

## 2019-06-19 DIAGNOSIS — R591 Generalized enlarged lymph nodes: Secondary | ICD-10-CM

## 2019-06-19 LAB — CBC WITH DIFFERENTIAL/PLATELET
Abs Immature Granulocytes: 0.03 10*3/uL (ref 0.00–0.07)
Basophils Absolute: 0.1 10*3/uL (ref 0.0–0.1)
Basophils Relative: 1 %
Eosinophils Absolute: 0.2 10*3/uL (ref 0.0–0.5)
Eosinophils Relative: 3 %
HCT: 42.6 % (ref 39.0–52.0)
Hemoglobin: 14 g/dL (ref 13.0–17.0)
Immature Granulocytes: 0 %
Lymphocytes Relative: 24 %
Lymphs Abs: 1.9 10*3/uL (ref 0.7–4.0)
MCH: 29.1 pg (ref 26.0–34.0)
MCHC: 32.9 g/dL (ref 30.0–36.0)
MCV: 88.6 fL (ref 80.0–100.0)
Monocytes Absolute: 0.7 10*3/uL (ref 0.1–1.0)
Monocytes Relative: 9 %
Neutro Abs: 4.9 10*3/uL (ref 1.7–7.7)
Neutrophils Relative %: 63 %
Platelets: 261 10*3/uL (ref 150–400)
RBC: 4.81 MIL/uL (ref 4.22–5.81)
RDW: 13.8 % (ref 11.5–15.5)
WBC: 7.9 10*3/uL (ref 4.0–10.5)
nRBC: 0 % (ref 0.0–0.2)

## 2019-06-19 LAB — COMPREHENSIVE METABOLIC PANEL
ALT: 24 U/L (ref 0–44)
AST: 24 U/L (ref 15–41)
Albumin: 3.8 g/dL (ref 3.5–5.0)
Alkaline Phosphatase: 109 U/L (ref 38–126)
Anion gap: 7 (ref 5–15)
BUN: 24 mg/dL — ABNORMAL HIGH (ref 8–23)
CO2: 27 mmol/L (ref 22–32)
Calcium: 9 mg/dL (ref 8.9–10.3)
Chloride: 105 mmol/L (ref 98–111)
Creatinine, Ser: 0.79 mg/dL (ref 0.61–1.24)
GFR calc Af Amer: >60 mL/min (ref >60)
GFR calc non Af Amer: >60 mL/min (ref >60)
Glucose, Bld: 99 mg/dL (ref 70–99)
Potassium: 3.8 mmol/L (ref 3.5–5.1)
Sodium: 139 mmol/L (ref 135–145)
Total Bilirubin: 0.4 mg/dL (ref 0.3–1.2)
Total Protein: 7.5 g/dL (ref 6.5–8.1)

## 2019-06-19 NOTE — Telephone Encounter (Signed)
Port placement orders was faxed over to Dr Leotis Pain officetoday. Fax conformation has been received. The patient has been informed

## 2019-06-19 NOTE — Telephone Encounter (Signed)
Left vm to schedule pt for a colonoscopy.

## 2019-06-19 NOTE — Progress Notes (Signed)
No new changes noted today 

## 2019-06-20 ENCOUNTER — Telehealth (INDEPENDENT_AMBULATORY_CARE_PROVIDER_SITE_OTHER): Payer: Self-pay

## 2019-06-20 ENCOUNTER — Other Ambulatory Visit: Payer: Self-pay

## 2019-06-20 ENCOUNTER — Other Ambulatory Visit (INDEPENDENT_AMBULATORY_CARE_PROVIDER_SITE_OTHER): Payer: Self-pay | Admitting: Nurse Practitioner

## 2019-06-20 DIAGNOSIS — C189 Malignant neoplasm of colon, unspecified: Secondary | ICD-10-CM | POA: Insufficient documentation

## 2019-06-20 DIAGNOSIS — Z7189 Other specified counseling: Secondary | ICD-10-CM | POA: Insufficient documentation

## 2019-06-20 DIAGNOSIS — C772 Secondary and unspecified malignant neoplasm of intra-abdominal lymph nodes: Secondary | ICD-10-CM | POA: Insufficient documentation

## 2019-06-20 DIAGNOSIS — R591 Generalized enlarged lymph nodes: Secondary | ICD-10-CM

## 2019-06-20 LAB — CEA: CEA: 6.4 ng/mL — ABNORMAL HIGH (ref 0.0–4.7)

## 2019-06-20 MED ORDER — DEXAMETHASONE 4 MG PO TABS: 8.0000 mg | ORAL_TABLET | Freq: Every day | ORAL | 1 refills | Status: DC

## 2019-06-20 MED ORDER — SUPREP BOWEL PREP KIT 17.5-3.13-1.6 GM/177ML PO SOLN: 1.0000 | ORAL | 0 refills | Status: DC

## 2019-06-20 MED ORDER — LIDOCAINE-PRILOCAINE 2.5-2.5 % EX CREA: TOPICAL_CREAM | CUTANEOUS | 3 refills | Status: DC

## 2019-06-20 MED ORDER — PROCHLORPERAZINE MALEATE 10 MG PO TABS: 10.0000 mg | ORAL_TABLET | Freq: Four times a day (QID) | ORAL | 1 refills | Status: DC | PRN

## 2019-06-20 MED ORDER — ONDANSETRON HCL 8 MG PO TABS: 8.0000 mg | ORAL_TABLET | Freq: Two times a day (BID) | ORAL | 1 refills | Status: DC | PRN

## 2019-06-20 NOTE — Telephone Encounter (Signed)
I attempted to contact the patient earlier and a message was left for a return call. Patient returned my call and is scheduled with Dr. Lucky Cowboy for 06/25/2019 with a 6:45 am arrival time to the [facility]. Patient will have Covid testing on 06/21/2019 between 12:30-2:30pm at the [facility]. Patient understood and stated he was writing the information down.

## 2019-06-20 NOTE — Progress Notes (Signed)
Hematology/Oncology Consult note [facility]  Telephone:([phone] Fax:([phone]  Patient Care Team: Jodi Marble, MD as PCP - General (Internal Medicine)   Name of the patient: Eric Simon  [id number]  12/25/1950   Date of visit: 06/20/19  Diagnosis- metastatic colon cancer  Chief complaint/ Reason for visit- discuss biopsy results and further management  Heme/Onc history: Patient is a 68 yr old male with >40 pack year history of smoking. He currently smokes 0.5ppd. he presented to the ER with symptoms of left sided chest pain and left arm pain. Troponin was negative ekg was unremarkable. Ct chest showed no PE. He was incidentally noted to have mediastinal and retrocrural adenopathy and a 2.1X2.3X4.4 cm retroaortic soft tissue lesion in the posterior left chest. 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  Low-grade activity in the left lateral fifth and sixth ribs associated with nondisplaced fractures likely reflecting healing response problem malignancy.  Patient underwent CT-guided biopsy of the retroperitoneal lymph node pathology showed metastatic adenocarcinoma compatible with colorectal origin.  CK7 negative.  CK20 positive.  CDX 2+.  TTF-1 negative.  PSA negative.  This pattern of immunoreactivity supports the above diagnosis.  Interval history-patient states that he has lost 15 pounds of weight over the last 3 to 4 months.  Denies any blood in stool or urine.  Denies any dark melanotic stools.  Denies any pain at this time.  Reports that his last colonoscopy was over 5 years ago  ECOG PS- 1 Pain scale- 0   Review of systems- Review of Systems  Constitutional: Positive for malaise/fatigue and weight loss. Negative for chills and fever.  HENT: Negative for congestion, ear discharge and nosebleeds.   Eyes: Negative  for blurred vision.  Respiratory: Negative for cough, hemoptysis, sputum production, shortness of breath and wheezing.   Cardiovascular: Negative for chest pain, palpitations, orthopnea and claudication.  Gastrointestinal: Negative for abdominal pain, blood in stool, constipation, diarrhea, heartburn, melena, nausea and vomiting.  Genitourinary: Negative for dysuria, flank pain, frequency, hematuria and urgency.  Musculoskeletal: Negative for back pain, joint pain and myalgias.  Skin: Negative for rash.  Neurological: Negative for dizziness, tingling, focal weakness, seizures, weakness and headaches.  Endo/Heme/Allergies: Does not bruise/bleed easily.  Psychiatric/Behavioral: Negative for depression and suicidal ideas. The patient does not have insomnia.       No Known Allergies   Past Medical History:  Diagnosis Date   COPD (chronic obstructive pulmonary disease) (HCC)    Hyperlipidemia      History reviewed. No pertinent surgical history.  Social History   Socioeconomic History   Marital status: Single    Spouse name: Not on file   Number of children: Not on file   Years of education: Not on file   Highest education level: Not on file  Occupational History   Not on file  Social Needs   Financial resource strain: Not hard at all   Food insecurity    Worry: Never true    Inability: Never true   Transportation needs    Medical: No    Non-medical: No  Tobacco Use   Smoking status: Current Every Day Smoker    Packs/day: 0.50   Smokeless tobacco: Never Used   Tobacco comment: smoking 40 years  Substance and Sexual Activity   Alcohol use: No   Drug use: No   Sexual activity: Not Currently  Lifestyle   Physical activity    Days per week: Not on file    Minutes per session: Not on file   Stress: Not at all  Relationships   Social connections    Talks on phone: More than three times a week    Gets together: Not on file    Attends religious  service: Not on file    Active member of club or organization: Not on file    Attends meetings of clubs or organizations: Not on file    Relationship status: Not on file   Intimate partner violence    Fear of current or ex partner: No    Emotionally abused: No    Physically abused: No    Forced sexual activity: No  Other Topics Concern   Not on file  Social History Narrative   Not on file    Family History  Problem Relation Age of Onset   Brain cancer Father      Current Outpatient Medications:    aspirin EC 81 MG tablet, Take 81 mg by mouth daily., Disp: , Rfl:    simvastatin (ZOCOR) 20 MG tablet, Take 20 mg by mouth daily., Disp: , Rfl:    cetirizine (ZYRTEC) 10 MG tablet, Take 1 tablet (10 mg total) by mouth daily. (Patient not taking: Reported on 06/12/2019), Disp: 30 tablet, Rfl: 0   cyclobenzaprine (FLEXERIL) 5 MG tablet, Take 1 tablet (5 mg total) by mouth 3 (three) times daily as needed for muscle spasms. (Patient not taking: Reported on 05/25/2019), Disp: 10 tablet, Rfl: 0   naproxen (NAPROSYN) 375 MG tablet, Take 1 tablet (375 mg total) by mouth 2 (two) times daily with a meal. (Patient not taking: Reported on 05/25/2019), Disp: 10 tablet, Rfl: 0  Physical exam:  Vitals:   06/19/19 1302  BP: 129/72  Pulse: 89  Resp: 18  Temp: 97.9 F (36.6 C)  TempSrc: Tympanic  SpO2: 99%  Weight: 148 lb 11.2 oz (67.4 kg)  Height: 5\' 7"  (1.702 m)   Physical Exam Constitutional:      General: He is not in acute distress. HENT:     Head: Normocephalic and atraumatic.  Eyes:     Comments: Right eye is prosthetic  Neck:     Musculoskeletal: Normal range of motion.  Cardiovascular:     Rate and Rhythm: Normal rate and regular rhythm.     Heart sounds: Normal heart sounds.  Pulmonary:     Effort: Pulmonary effort is normal.     Breath sounds: Normal breath sounds.  Abdominal:     General: Bowel sounds are normal.     Palpations: Abdomen is soft.  Skin:    General:  Skin is warm and dry.  Neurological:     Mental Status: He is alert and oriented to person, place, and time.      CMP Latest Ref Rng & Units 06/19/2019  Glucose 70 - 99 mg/dL 99  BUN 8 - 23 mg/dL 24(H)  Creatinine 0.61 - 1.24 mg/dL 0.79  Sodium 135 - 145 mmol/L 139  Potassium 3.5 - 5.1 mmol/L 3.8  Chloride 98 - 111 mmol/L 105  CO2 22 - 32 mmol/L 27  Calcium 8.9 - 10.3 mg/dL 9.0  Total Protein 6.5 - 8.1 g/dL 7.5  Total Bilirubin 0.3 - 1.2 mg/dL 0.4  Alkaline Phos 38 - 126 U/L 109  AST 15 - 41 U/L 24  ALT 0 - 44 U/L 24   CBC Latest Ref Rng &  Units 06/19/2019  WBC 4.0 - 10.5 K/uL 7.9  Hemoglobin 13.0 - 17.0 g/dL 14.0  Hematocrit 39.0 - 52.0 % 42.6  Platelets 150 - 400 K/uL 261    No images are attached to the encounter.  Dg Chest 2 View  Result Date: 05/24/2019 CLINICAL DATA:  Chest pain. EXAM: CHEST - 2 VIEW COMPARISON:  None. FINDINGS: Lungs are clear without airspace disease or pulmonary edema. Lung markings are mildly coarse and could represent chronic changes. Mild blunting at the left costophrenic angle. Heart and mediastinum are within normal limits. Bone structures are unremarkable. IMPRESSION: Mild blunting at the left costophrenic angle. Findings could represent scarring versus tiny effusion. Otherwise, no acute lung findings. Electronically Signed   By: Markus Daft M.D.   On: 05/24/2019 13:55   Ct Angio Chest Pe W And/or Wo Contrast  Result Date: 05/24/2019 CLINICAL DATA:  Chest pain radiates to left arm. EXAM: CT ANGIOGRAPHY CHEST WITH CONTRAST TECHNIQUE: Multidetector CT imaging of the chest was performed using the standard protocol during bolus administration of intravenous contrast. Multiplanar CT image reconstructions and MIPs were obtained to evaluate the vascular anatomy. CONTRAST:  64mL OMNIPAQUE IOHEXOL 350 MG/ML SOLN COMPARISON:  None. FINDINGS: Cardiovascular: The heart size is normal. No substantial pericardial effusion. Coronary artery calcification is evident.  Atherosclerotic calcification is noted in the wall of the thoracic aorta. No filling defect within the opacified pulmonary arteries to suggest the presence of an acute pulmonary embolus. Mediastinum/Nodes: 11 mm short axis AP window lymph node visible on 37/4. 12 mm short axis subcarinal lymph node evident. 8 mm short axis left hilar lymph node visible on 49/4. A retroaortic ill-defined soft tissue lesion is identified in the left paraspinal chest measuring 2.1 x 2.3 x 4.4 cm (image 40/4). This is contiguous with the descending thoracic aorta. A 12 mm short axis low paraesophageal node is visible on 83/4 with small retrocrural lymph nodes associated. Lungs/Pleura: Centrilobular and paraseptal emphysema evident. Probable dependent atelectasis in the lower lungs bilaterally. Dependent collapse/consolidation noted posterior left lower lobe with tiny left pleural effusion. Upper Abdomen: Unremarkable. Musculoskeletal: No worrisome lytic or sclerotic osseous abnormality. Review of the MIP images confirms the above findings. IMPRESSION: 1. No CT evidence for acute pulmonary embolus. 2. Borderline to mild mediastinal and retrocrural lymphadenopathy associated with a 2.1 x 2.3 x 4.4 cm ill-defined retroaortic soft tissue lesion in the posterior left chest. Metastatic disease and lymphoma cannot be excluded. PET-CT likely warranted to further evaluate. 3. Dependent collapse/consolidation in the posterior left lower lobe with tiny left pleural effusion. Electronically Signed   By: Misty Stanley M.D.   On: 05/24/2019 18:58   Nm Pet Image Initial (pi) Skull Base To Thigh  Result Date: 06/06/2019 CLINICAL DATA:  Initial treatment strategy for thoracic and upper abdominal adenopathy. EXAM: NUCLEAR MEDICINE PET SKULL BASE TO THIGH TECHNIQUE: 8.2 mCi F-18 FDG was injected intravenously. Full-ring PET imaging was performed from the skull base to thigh after the radiotracer. CT data was obtained and used for attenuation  correction and anatomic localization. Fasting blood glucose: 79 mg/dl COMPARISON:  Chest CT 05/24/2019 FINDINGS: Mediastinal blood pool activity: SUV max 2.2 Liver activity: SUV max 3.7 NECK: No significant abnormal hypermetabolic activity in this region. Incidental CT findings: Prosthetic right globe. Mild right common carotid artery atherosclerotic calcification. CHEST: Mildly hypermetabolic left supraclavicular, upper mediastinal prevascular, and periaortic adenopathy is identified. Left supraclavicular node 0.7 cm in short axis on image 66/3, maximum SUV 3.1 (Deauville 3). Prevascular node in  the upper mediastinum measures 0.9 cm in short axis the indistinctly marginated left posterior periaortic lesion measuring 1.2 by 1.6 cm has maximum SUV of 3.4, Deauville 3. Faintly accentuated left hilar activity observed, maximum SUV 3.4, Deauville 3. Just posterior to the esophagus as it enters the hiatus, a lower thoracic para aortic lymph node measures 1.3 cm in short axis on image 139/3, maximum SUV 6.7, Deauville 4. There is also a right retro diaphragmatic lymph node along the pleural margin measuring 0.7 cm in short axis on image 137/3 with maximum SUV 3.5, Deauville 3. Incidental CT findings: Centrilobular emphysema. ABDOMEN/PELVIS: Hypermetabolic retroperitoneal, bilateral common iliac, and left external iliac adenopathy with multiple enlarged and hypermetabolic lymph nodes observed. An index aortocaval node measuring 1.9 cm in short axis on image 173/3 has a maximum SUV of 9.4. A left common iliac node at about the level of the bifurcation measures 1.6 cm in short axis 8.3. A left external iliac node measuring 2.0 cm in short axis on image 231/3 has a maximum SUV of 7.8. These nodes are essentially borderline between Deauville 4 and Deauville 5. Vague activity in the right lower quadrant does not have a CT correlate and probably represents a physiologic bowel activity, less likely to be some occult mesenteric  nodal activity. There is subtle activity along the genu of the left adrenal gland with maximum SUV 4.6, possibly due to an adjacent small hypermetabolic lymph node but technically nonspecific. Incidental CT findings: Aortoiliac atherosclerotic vascular disease. No splenomegaly or splenic hypermetabolic activity. SKELETON: Focally accentuated activity in the left lateral fifth rib (maximum SUV 4.7) and left sixth rib (maximum SUV 4.6) associated with nondisplaced rib fractures. Incidental CT findings: none IMPRESSION: 1. Pathologic retroperitoneal, pelvic, and thoracic adenopathy favoring malignancy such as lymphoma. Thoracic adenopathy is primarily Deauville 3 activity, with the larger lymph nodes in the abdomen and pelvis along the borderline between Deauville 4 and Deauville 5 activity. 2. Low-grade activity focally in the left lateral fifth and sixth ribs associated with nondisplaced rib fractures, likely reflecting healing response rather than malignancy. 3. Aortic Atherosclerosis (ICD10-I70.0) and Emphysema (ICD10-J43.9). Electronically Signed   By: Van Clines M.D.   On: 06/06/2019 16:58   Ct Biopsy  Result Date: 06/12/2019 INDICATION: Retroperitoneal lymphadenopathy. EXAM: CT-DIRECTED RETROPERITONEAL LYMPH NODE BIOPSY MEDICATIONS: IV conscious sedation as below.  1% local lidocaine. ANESTHESIA/SEDATION: Moderate (conscious) sedation was employed during this procedure. A total of Versed 3 mg and Fentanyl 100 mcg was administered intravenously. Moderate Sedation Time: 20 minutes. The patient's level of consciousness and vital signs were monitored continuously by radiology nursing throughout the procedure under my direct supervision. FLUOROSCOPY TIME:  None. COMPLICATIONS: None immediate. PROCEDURE: After discussing the risks and benefits of this procedure the patient informed consent was obtained. Back was sterilely prepped and draped. Following local anesthesia 1% lidocaine and IV conscious sedation  18 gauge core biopsy needle system was advanced into a retroperitoneal lymph node and 2 core samples obtained. Samples sent to pathology in saline and formalin. Post biopsy CT revealed no complications. IMPRESSION: Successful CT-directed core biopsies of retroperitoneal left noted. Electronically Signed   By: Marcello Moores  Register   On: 06/12/2019 11:35     Assessment and plan- Patient is a 68 y.o. male with incidentally found diffuse intra-abdominal and intrathoracic adenopathy.  Biopsy consistent with colon cancer  1. Patient found to have supraclavicular as well as mediastinal adenopathy along with bulky retroperitoneal common iliac and external iliac adenopathy.  I have reviewed PET/CT scan images  independently and discussed findings with the patient.  Retroperitoneal biopsy was consistent with metastatic adenocarcinoma of colorectal origin given it was positive for CK20 and CDX2.  Patient's last colonoscopy was about 5 years ago and we did [facility] to [facility] to [facility] out who had done his colonoscopy and it appears that the provider was currently not working with that practice anymore.  I will therefore refer the patient to [facility] to get an urgent colonoscopy to see if there is a primary lesion that would confirm the above clinical diagnosis.  I discussed with the patient that given that he has metastatic adenopathy has stage IV disease and therefore there would be no role for primary surgery at this time.  Radiation treatment is also not indicated.  I would recommend palliative chemotherapy with FOLFOX and Avastin every 2 weeks until progression or toxicity.  I may consider dropping oxaliplatin if he develops peripheral neuropathy after 3 to 6 months.  Discussed risks and benefits of chemotherapy including all but not limited to nausea, vomiting, low blood counts, risk of infections and hospitalization.  Risk of peripheral neuropathy associated with oxaliplatin.  Risk of  thromboembolism, leg swelling and proteinuria associated with a Avastin.  Patient understands and agrees to proceed as planned.  Treatment will be given with a palliative intent.  Patient will need chemotherapy teach as well as port placement for chemotherapy.  I will tentatively see him back in 2 weeks time to start his first cycle of FOLFOX Avastin chemotherapy.  I will check CBC with differential, CMP and CEA today  Cancer Staging 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 [facility]) Staging form: Colon and Rectum, AJCC 8th Edition - Clinical stage from 06/19/2019: Stage Unknown (cTX, cNX, pM1) - Signed by Sindy Guadeloupe, MD on 06/20/2019      Visit Diagnosis 1. Lymphadenopathy   2. 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 ([place])   3. Goals of care, counseling/discussion      Dr. Randa Evens, MD, MPH [facility] [phone] 06/20/2019 8:24 AM

## 2019-06-20 NOTE — Progress Notes (Signed)
START ON PATHWAY REGIMEN - Colorectal     A cycle is every 14 days:     Bevacizumab-xxxx      Oxaliplatin      Leucovorin      Fluorouracil      Fluorouracil   **Always confirm dose/schedule in your pharmacy ordering system**  Patient Characteristics: Distant Metastases, Nonsurgical Candidate, KRAS/NRAS Mutation Positive/Unknown (BRAF V600 Wild-Type/Unknown), Standard Cytotoxic Therapy, First Line Standard Cytotoxic Therapy, Bevacizumab Eligible, PS = 0,1 Tumor Location: Colon Therapeutic Status: Distant Metastases Microsatellite/Mismatch Repair Status: Unknown BRAF Mutation Status: Awaiting Test Results KRAS/NRAS Mutation Status: Awaiting Test Results Standard Cytotoxic Line of Therapy: First Line Standard Cytotoxic Therapy ECOG Performance Status: 1 Bevacizumab Eligibility: Eligible Intent of Therapy: Non-Curative / Palliative Intent, Discussed with Patient 

## 2019-06-21 ENCOUNTER — Other Ambulatory Visit: Payer: Medicare HMO

## 2019-06-21 ENCOUNTER — Other Ambulatory Visit: Payer: Self-pay

## 2019-06-21 ENCOUNTER — Other Ambulatory Visit
Admission: RE | Admit: 2019-06-21 | Discharge: 2019-06-21 | Disposition: A | Discharge status: Home / Self Care | Payer: Medicare HMO | Source: Ambulatory Visit | Attending: Vascular Surgery | Admitting: Vascular Surgery

## 2019-06-21 DIAGNOSIS — Z01812 Encounter for preprocedural laboratory examination: Secondary | ICD-10-CM | POA: Insufficient documentation

## 2019-06-21 DIAGNOSIS — Z1159 Encounter for screening for other viral diseases: Secondary | ICD-10-CM | POA: Diagnosis not present

## 2019-06-21 NOTE — Progress Notes (Addendum)
Tumor Board Documentation  Eric Simon was presented by Dr [name] at our Tumor Board on 06/21/2019, which included representatives from medical oncology, radiation oncology, surgical, radiology, pathology, navigation, internal medicine, palliative care, research.  Eric Simon currently presents as a new patient, for new positive pathology, for discussion, for [place] with history of the following treatments: active survellience, surgical intervention(s).  Additionally, we reviewed previous medical and familial history, history of present illness, and recent lab results along with all available histopathologic and imaging studies. The tumor board considered available treatment options and made the following recommendations: Palliative chemotherapy, colonoscopy    The following procedures/referrals were also placed: No orders of the defined types were placed in this encounter.   Clinical Trial Status: not discussed   Staging used: AJCC Stage Group  AJCC Staging: T: x N: x M: 1 Group: Stage 4 Metastatic Adenocarcinoma colon   National site-specific guidelines NCCN were discussed with respect to the case.  Tumor board is a meeting of clinicians from various specialty areas who evaluate and discuss patients for whom a multidisciplinary approach is being considered. Final determinations in the plan of care are those of the provider(s). The responsibility for follow up of recommendations given during tumor board is that of the provider.   Today's extended care, comprehensive team conference, Eric Simon was not present for the discussion and was not examined.   Multidisciplinary Tumor Board is a multidisciplinary case peer review process.  Decisions discussed in the Multidisciplinary Tumor Board reflect the opinions of the specialists present at the conference without having examined the patient.  Ultimately, treatment and diagnostic decisions rest with the primary provider(s) and the patient.

## 2019-06-22 ENCOUNTER — Telehealth: Payer: Self-pay | Admitting: *Deleted

## 2019-06-22 LAB — SARS CORONAVIRUS 2 (TAT 6-24 HRS): SARS Coronavirus 2: NEGATIVE

## 2019-06-22 NOTE — Telephone Encounter (Addendum)
Patient called reporting that he is having severe back pain and that he is unable to keep any food down for past 2 days. Please [name]

## 2019-06-22 NOTE — Telephone Encounter (Signed)
Call returned to patient and I left message on his voice mail that he should go to the ER and to call back for any problems

## 2019-06-22 NOTE — Telephone Encounter (Signed)
Spoke with patient and informed him of chemo class appt at [facility] on wed July 15th at the cancer center.  Pt verbalized understanding.

## 2019-06-22 NOTE — Telephone Encounter (Signed)
He should go to ER

## 2019-06-24 MED ORDER — CEFAZOLIN SODIUM-DEXTROSE 2-4 GM/100ML-% IV SOLN
2.0000 g | Freq: Once | INTRAVENOUS | Status: AC
  Administered 2019-06-25: 2 g via INTRAVENOUS

## 2019-06-25 ENCOUNTER — Other Ambulatory Visit: Payer: Self-pay

## 2019-06-25 ENCOUNTER — Ambulatory Visit
Admission: RE | Admit: 2019-06-25 | Discharge: 2019-06-25 | Disposition: A | Discharge status: Home / Self Care | Payer: Medicare HMO | Attending: Vascular Surgery | Admitting: Vascular Surgery

## 2019-06-25 ENCOUNTER — Encounter
Admission: RE | Disposition: A | Discharge status: Home / Self Care | Payer: Self-pay | Source: Home / Self Care | Attending: Vascular Surgery

## 2019-06-25 DIAGNOSIS — Z7982 Long term (current) use of aspirin: Secondary | ICD-10-CM | POA: Diagnosis not present

## 2019-06-25 DIAGNOSIS — Z79899 Other long term (current) drug therapy: Secondary | ICD-10-CM | POA: Insufficient documentation

## 2019-06-25 DIAGNOSIS — C19 Malignant neoplasm of rectosigmoid junction: Secondary | ICD-10-CM | POA: Insufficient documentation

## 2019-06-25 DIAGNOSIS — F17210 Nicotine dependence, cigarettes, uncomplicated: Secondary | ICD-10-CM | POA: Diagnosis not present

## 2019-06-25 DIAGNOSIS — R591 Generalized enlarged lymph nodes: Secondary | ICD-10-CM | POA: Diagnosis not present

## 2019-06-25 DIAGNOSIS — E785 Hyperlipidemia, unspecified: Secondary | ICD-10-CM | POA: Diagnosis not present

## 2019-06-25 DIAGNOSIS — J449 Chronic obstructive pulmonary disease, unspecified: Secondary | ICD-10-CM | POA: Diagnosis not present

## 2019-06-25 HISTORY — PX: PORTA CATH INSERTION: CATH118285

## 2019-06-25 SURGERY — PORTA CATH INSERTION
Anesthesia: Moderate Sedation

## 2019-06-25 MED ORDER — HEPARIN (PORCINE) IN NACL 1000-0.9 UT/500ML-% IV SOLN
INTRAVENOUS | Status: AC
  Filled 2019-06-25: qty 500

## 2019-06-25 MED ORDER — CEFAZOLIN SODIUM-DEXTROSE 2-4 GM/100ML-% IV SOLN
INTRAVENOUS | Status: AC
  Administered 2019-06-25: 2 g via INTRAVENOUS
  Filled 2019-06-25: qty 100

## 2019-06-25 MED ORDER — MIDAZOLAM HCL 2 MG/2ML IJ SOLN
INTRAMUSCULAR | Status: DC | PRN
  Administered 2019-06-25: 2 mg via INTRAVENOUS

## 2019-06-25 MED ORDER — METHYLPREDNISOLONE SODIUM SUCC 125 MG IJ SOLR: 125.0000 mg | Freq: Once | INTRAMUSCULAR | Status: DC | PRN

## 2019-06-25 MED ORDER — MIDAZOLAM HCL 2 MG/ML PO SYRP: 8.0000 mg | ORAL_SOLUTION | Freq: Once | ORAL | Status: DC | PRN

## 2019-06-25 MED ORDER — ONDANSETRON HCL 4 MG/2ML IJ SOLN: 4.0000 mg | Freq: Four times a day (QID) | INTRAMUSCULAR | Status: DC | PRN

## 2019-06-25 MED ORDER — FAMOTIDINE 20 MG PO TABS: 40.0000 mg | ORAL_TABLET | Freq: Once | ORAL | Status: DC | PRN

## 2019-06-25 MED ORDER — FENTANYL CITRATE (PF) 100 MCG/2ML IJ SOLN
INTRAMUSCULAR | Status: DC | PRN
  Administered 2019-06-25: 50 ug via INTRAVENOUS

## 2019-06-25 MED ORDER — MIDAZOLAM HCL 5 MG/5ML IJ SOLN
INTRAMUSCULAR | Status: AC
  Filled 2019-06-25: qty 5

## 2019-06-25 MED ORDER — SODIUM CHLORIDE 0.9 % IV SOLN
Freq: Once | INTRAVENOUS | Status: AC
  Administered 2019-06-25: 09:00:00
  Filled 2019-06-25: qty 80

## 2019-06-25 MED ORDER — DIPHENHYDRAMINE HCL 50 MG/ML IJ SOLN: 50.0000 mg | Freq: Once | INTRAMUSCULAR | Status: DC | PRN

## 2019-06-25 MED ORDER — HYDROMORPHONE HCL 1 MG/ML IJ SOLN: 1.0000 mg | Freq: Once | INTRAMUSCULAR | Status: DC | PRN

## 2019-06-25 MED ORDER — LIDOCAINE-EPINEPHRINE (PF) 1 %-1:200000 IJ SOLN
INTRAMUSCULAR | Status: AC
  Filled 2019-06-25: qty 30

## 2019-06-25 MED ORDER — SODIUM CHLORIDE 0.9 % IV SOLN
INTRAVENOUS | Status: DC
  Administered 2019-06-25: 08:00:00 via INTRAVENOUS

## 2019-06-25 MED ORDER — FENTANYL CITRATE (PF) 100 MCG/2ML IJ SOLN
INTRAMUSCULAR | Status: AC
  Filled 2019-06-25: qty 2

## 2019-06-25 SURGICAL SUPPLY — 9 items
DERMABOND ADVANCED (GAUZE/BANDAGES/DRESSINGS) ×1
DERMABOND ADVANCED .7 DNX12 (GAUZE/BANDAGES/DRESSINGS) ×1 IMPLANT
KIT PORT POWER 8FR ISP CVUE (Port) ×2 IMPLANT
PACK ANGIOGRAPHY (CUSTOM PROCEDURE TRAY) ×2 IMPLANT
PAD GROUND ADULT SPLIT (MISCELLANEOUS) ×2 IMPLANT
PENCIL ELECTRO HAND CTR (MISCELLANEOUS) ×2 IMPLANT
SUT MNCRL AB 4-0 PS2 18 (SUTURE) ×2 IMPLANT
SUT PROLENE 0 CT 1 30 (SUTURE) ×2 IMPLANT
SUT VICRYL+ 3-0 36IN CT-1 (SUTURE) ×2 IMPLANT

## 2019-06-25 NOTE — Op Note (Signed)
      [facility]       Operative Note  Date: 06/25/2019  Preoperative diagnosis:  1.  Colorectal cancer  Postoperative diagnosis:  Same as above  Procedures: #1. Ultrasound guidance for vascular access to the right internal jugular vein. #2. Fluoroscopic guidance for placement of catheter. #3. Placement of CT compatible Port-A-Cath, right internal jugular vein.  Surgeon: Leotis Pain, MD.   Anesthesia: Local with moderate conscious sedation for approximately 20 minutes using 2 mg of Versed and 50 Mcg of Fentanyl  Fluoroscopy time: less than 1 minute  Contrast used: 0  Estimated blood loss: 3 cc  Indication for the procedure:  The patient is a 68 y.o.male with colorectal cancer.  The patient needs a Port-A-Cath for durable venous access, chemotherapy, lab draws, and CT scans. We are asked to place this. Risks and benefits were discussed and informed consent was obtained.  Description of procedure: The patient was brought to the vascular and interventional radiology suite.  Moderate conscious sedation was administered throughout the procedure during a face to face encounter with the patient with my supervision of the RN administering medicines and monitoring the patient's vital signs, pulse oximetry, telemetry and mental status throughout from the start of the procedure until the patient was taken to the recovery room. The right neck chest and shoulder were sterilely prepped and draped, and a sterile surgical field was created. Ultrasound was used to help visualize a patent right internal jugular vein. This was then accessed under direct ultrasound guidance without difficulty with the Seldinger needle and a permanent image was recorded. A J-wire was placed. After skin nick and dilatation, the peel-away sheath was then placed over the wire. I then anesthetized an area under the clavicle approximately 1-2 fingerbreadths. A transverse incision was created and an inferior  pocket was created with electrocautery and blunt dissection. The port was then brought onto the field, placed into the pocket and secured to the chest wall with 2 Prolene sutures. The catheter was connected to the port and tunneled from the subclavicular incision to the access site. Fluoroscopic guidance was then used to cut the catheter to an appropriate length. The catheter was then placed through the peel-away sheath and the peel-away sheath was removed. The catheter tip was parked in excellent location under fluorocoscopic guidance in the SVC just above the right atrium. The pocket was then irrigated with antibiotic impregnated saline and the wound was closed with a running 3-0 Vicryl and a 4-0 Monocryl. The access incision was closed with a single 4-0 Monocryl. The Huber needle was used to withdraw blood and flush the port with heparinized saline. Dermabond was then placed as a dressing. The patient tolerated the procedure well and was taken to the recovery room in stable condition.   Leotis Pain 06/25/2019 8:45 AM   This note was created with Dragon Medical transcription system. Any errors in dictation are purely unintentional.

## 2019-06-25 NOTE — H&P (Signed)
[facility] History & Physical Update  The patient was interviewed and re-examined.  The patient's previous History and Physical has been reviewed and is unchanged.  There is no change in the plan of care. We plan to proceed with the scheduled procedure.  Leotis Pain, MD  06/25/2019, 8:03 AM

## 2019-06-26 ENCOUNTER — Ambulatory Visit: Payer: Medicare HMO | Admitting: Certified Registered"

## 2019-06-26 ENCOUNTER — Ambulatory Visit
Admission: RE | Admit: 2019-06-26 | Discharge: 2019-06-26 | Disposition: A | Discharge status: Home / Self Care | Payer: Medicare HMO | Attending: Gastroenterology | Admitting: Gastroenterology

## 2019-06-26 ENCOUNTER — Other Ambulatory Visit: Payer: Self-pay

## 2019-06-26 ENCOUNTER — Encounter: Payer: Self-pay | Admitting: Anesthesiology

## 2019-06-26 ENCOUNTER — Encounter
Admission: RE | Disposition: A | Discharge status: Home / Self Care | Payer: Self-pay | Source: Home / Self Care | Attending: Gastroenterology

## 2019-06-26 DIAGNOSIS — D122 Benign neoplasm of ascending colon: Secondary | ICD-10-CM | POA: Insufficient documentation

## 2019-06-26 DIAGNOSIS — Z7982 Long term (current) use of aspirin: Secondary | ICD-10-CM | POA: Insufficient documentation

## 2019-06-26 DIAGNOSIS — J449 Chronic obstructive pulmonary disease, unspecified: Secondary | ICD-10-CM | POA: Insufficient documentation

## 2019-06-26 DIAGNOSIS — C187 Malignant neoplasm of sigmoid colon: Secondary | ICD-10-CM | POA: Diagnosis not present

## 2019-06-26 DIAGNOSIS — F17210 Nicotine dependence, cigarettes, uncomplicated: Secondary | ICD-10-CM | POA: Diagnosis not present

## 2019-06-26 DIAGNOSIS — K5730 Diverticulosis of large intestine without perforation or abscess without bleeding: Secondary | ICD-10-CM | POA: Diagnosis not present

## 2019-06-26 DIAGNOSIS — D490 Neoplasm of unspecified behavior of digestive system: Secondary | ICD-10-CM

## 2019-06-26 DIAGNOSIS — Z1211 Encounter for screening for malignant neoplasm of colon: Secondary | ICD-10-CM | POA: Insufficient documentation

## 2019-06-26 DIAGNOSIS — Z79899 Other long term (current) drug therapy: Secondary | ICD-10-CM | POA: Insufficient documentation

## 2019-06-26 DIAGNOSIS — R591 Generalized enlarged lymph nodes: Secondary | ICD-10-CM

## 2019-06-26 DIAGNOSIS — E785 Hyperlipidemia, unspecified: Secondary | ICD-10-CM | POA: Insufficient documentation

## 2019-06-26 HISTORY — PX: COLONOSCOPY WITH PROPOFOL: SHX5780

## 2019-06-26 SURGERY — COLONOSCOPY WITH PROPOFOL
Anesthesia: General

## 2019-06-26 MED ORDER — PROPOFOL 10 MG/ML IV BOLUS
INTRAVENOUS | Status: DC | PRN
  Administered 2019-06-26 (×3): 20 mg via INTRAVENOUS
  Administered 2019-06-26 (×2): 50 mg via INTRAVENOUS
  Administered 2019-06-26: 20 mg via INTRAVENOUS
  Administered 2019-06-26: 100 mg via INTRAVENOUS
  Administered 2019-06-26: 20 mg via INTRAVENOUS

## 2019-06-26 MED ORDER — SODIUM CHLORIDE 0.9 % IV SOLN
INTRAVENOUS | Status: DC
  Administered 2019-06-26: 1000 mL via INTRAVENOUS

## 2019-06-26 MED ORDER — PHENYLEPHRINE HCL (PRESSORS) 10 MG/ML IV SOLN
INTRAVENOUS | Status: DC | PRN
  Administered 2019-06-26 (×3): 100 ug via INTRAVENOUS

## 2019-06-26 NOTE — H&P (Signed)
Eric Bellows, MD [address] [place], [place], [place], [address] [address], [place], [place], [place], [address] Phone: [phone]  Fax: [phone]  Primary Care Physician:  Jodi Marble, MD   Pre-Procedure History & Physical: HPI:  Eric Simon is a 68 y.o. male is here for an colonoscopy.   Past Medical History:  Diagnosis Date  . COPD (chronic obstructive pulmonary disease) ([place])   . Hyperlipidemia     Past Surgical History:  Procedure Laterality Date  . PORTA CATH INSERTION N/A 06/25/2019   Procedure: PORTA CATH INSERTION;  Surgeon: Algernon Huxley, MD;  Location: [facility];  Service: Cardiovascular;  Laterality: N/A;    Prior to Admission medications   Medication Sig Start Date End Date Taking? Authorizing Provider  aspirin EC 81 MG tablet Take 81 mg by mouth daily.   Yes [provider]  cetirizine (ZYRTEC) 10 MG tablet Take 1 tablet (10 mg total) by mouth daily. 12/14/16  Yes Hagler, Jami L, PA-C  cyclobenzaprine (FLEXERIL) 5 MG tablet Take 1 tablet (5 mg total) by mouth 3 (three) times daily as needed for muscle spasms. 05/24/19  Yes Nance Pear, MD  dexamethasone (DECADRON) 4 MG tablet Take 2 tablets (8 mg total) by mouth daily. Start the day after chemotherapy for 2 days. Take with food. 06/20/19  Yes Sindy Guadeloupe, MD  lidocaine-prilocaine (EMLA) cream Apply to affected area once 06/20/19  Yes Sindy Guadeloupe, MD  Na Sulfate-K Sulfate-Mg Sulf (SUPREP BOWEL PREP KIT) 17.5-3.13-1.6 GM/177ML SOLN Take 1 kit by mouth as directed. 06/20/19  Yes Eric Bellows, MD  naproxen (NAPROSYN) 375 MG tablet Take 1 tablet (375 mg total) by mouth 2 (two) times daily with a meal. 03/18/18  Yes Sable Feil, PA-C  ondansetron (ZOFRAN) 8 MG tablet Take 1 tablet (8 mg total) by mouth 2 (two) times daily as needed for refractory nausea / vomiting. Start on day 3 after chemotherapy. 06/20/19  Yes Sindy Guadeloupe, MD  prochlorperazine (COMPAZINE) 10 MG tablet  Take 1 tablet (10 mg total) by mouth every 6 (six) hours as needed (Nausea or vomiting). 06/20/19  Yes Sindy Guadeloupe, MD  simvastatin (ZOCOR) 20 MG tablet Take 20 mg by mouth daily.   Yes [provider]    Allergies as of 06/20/2019  . (No Known Allergies)    Family History  Problem Relation Age of Onset  . Brain cancer Father     Social History   Socioeconomic History  . Marital status: Single    Spouse name: Not on file  . Number of children: Not on file  . Years of education: Not on file  . Highest education level: Not on file  Occupational History  . Not on file  Social Needs  . Financial resource strain: Not hard at all  . Food insecurity    Worry: Never true    Inability: Never true  . Transportation needs    Medical: No    Non-medical: No  Tobacco Use  . Smoking status: Current Every Day Smoker    Packs/day: 0.50  . Smokeless tobacco: Never Used  . Tobacco comment: smoking 40 years  Substance and Sexual Activity  . Alcohol use: No  . Drug use: No  . Sexual activity: Not Currently  Lifestyle  . Physical activity    Days per week: Not on file    Minutes per session: Not on file  . Stress: Not at  all  Relationships  . Social connections    Talks on phone: More than three times a week    Gets together: Not on file    Attends religious service: Not on file    Active member of club or organization: Not on file    Attends meetings of clubs or organizations: Not on file    Relationship status: Not on file  . Intimate partner violence    Fear of current or ex partner: No    Emotionally abused: No    Physically abused: No    Forced sexual activity: No  Other Topics Concern  . Not on file  Social History Narrative  . Not on file    Review of Systems: See HPI, otherwise negative ROS  Physical Exam: BP 118/67   Pulse 89   Temp 97.9 F (36.6 C) (Tympanic)   Resp 20   Ht '5\' 7"'  (1.702 m)   Wt 65.8 kg   SpO2 100%   BMI 22.71 kg/m  General:    Alert,  pleasant and cooperative in NAD Head:  Normocephalic and atraumatic. Neck:  Supple; no masses or thyromegaly. Lungs:  Clear throughout to auscultation, normal respiratory effort.    Heart:  +S1, +S2, Regular rate and rhythm, No edema. Abdomen:  Soft, nontender and nondistended. Normal bowel sounds, without guarding, and without rebound.   Neurologic:  Alert and  oriented x4;  grossly normal neurologically.  Impression/Plan: ALDEAN PIPE is here for an colonoscopy to be performed for Screening colonoscopy average risk   Risks, benefits, limitations, and alternatives regarding  colonoscopy have been reviewed with the patient.  Questions have been answered.  All parties agreeable.   Eric Bellows, MD  06/26/2019, 8:40 AM

## 2019-06-26 NOTE — Patient Instructions (Signed)
Bevacizumab injection What is this medicine? BEVACIZUMAB (be va SIZ yoo mab) is a monoclonal antibody. It is used to treat many types of cancer. This medicine may be used for other purposes; ask your health care provider or pharmacist if you have questions. COMMON BRAND NAME(S): Avastin, MVASI, Zirabev What should I tell my health care provider before I take this medicine? They need to know if you have any of these conditions:  diabetes  heart disease  high blood pressure  history of coughing up blood  prior anthracycline chemotherapy (e.g., doxorubicin, daunorubicin, epirubicin)  recent or ongoing radiation therapy  recent or planning to have surgery  stroke  an unusual or allergic reaction to bevacizumab, hamster proteins, mouse proteins, other medicines, foods, dyes, or preservatives  pregnant or trying to get pregnant  breast-feeding How should I use this medicine? This medicine is for infusion into a vein. It is given by a health care professional in a hospital or clinic setting. Talk to your pediatrician regarding the use of this medicine in children. Special care may be needed. Overdosage: If you think you have taken too much of this medicine contact a poison control center or emergency room at once. NOTE: This medicine is only for you. Do not share this medicine with others. What if I miss a dose? It is important not to miss your dose. Call your doctor or health care professional if you are unable to keep an appointment. What may interact with this medicine? Interactions are not expected. This list may not describe all possible interactions. Give your health care provider a list of all the medicines, herbs, non-prescription drugs, or dietary supplements you use. Also tell them if you smoke, drink alcohol, or use illegal drugs. Some items may interact with your medicine. What should I watch for while using this medicine? Your condition will be monitored carefully while  you are receiving this medicine. You will need important blood work and urine testing done while you are taking this medicine. This medicine may increase your risk to bruise or bleed. Call your doctor or health care professional if you notice any unusual bleeding. This medicine should be started at least 28 days following major surgery and the site of the surgery should be totally healed. Check with your doctor before scheduling dental work or surgery while you are receiving this treatment. Talk to your doctor if you have recently had surgery or if you have a wound that has not healed. Do not become pregnant while taking this medicine or for 6 months after stopping it. Women should inform their doctor if they wish to become pregnant or think they might be pregnant. There is a potential for serious side effects to an unborn child. Talk to your health care professional or pharmacist for more information. Do not breast-feed an infant while taking this medicine and for 6 months after the last dose. This medicine has caused ovarian failure in some women. This medicine may interfere with the ability to have a child. You should talk to your doctor or health care professional if you are concerned about your fertility. What side effects may I notice from receiving this medicine? Side effects that you should report to your doctor or health care professional as soon as possible:  allergic reactions like skin rash, itching or hives, swelling of the face, lips, or tongue  chest pain or chest tightness  chills  coughing up blood  high fever  seizures  severe constipation  signs and symptoms   of bleeding such as bloody or black, tarry stools; red or dark-brown urine; spitting up blood or brown material that looks like coffee grounds; red spots on the skin; unusual bruising or bleeding from the eye, gums, or nose  signs and symptoms of a blood clot such as breathing problems; chest pain; severe, sudden  headache; pain, swelling, warmth in the leg  signs and symptoms of a stroke like changes in vision; confusion; trouble speaking or understanding; severe headaches; sudden numbness or weakness of the face, arm or leg; trouble walking; dizziness; loss of balance or coordination  stomach pain  sweating  swelling of legs or ankles  vomiting  weight gain Side effects that usually do not require medical attention (report to your doctor or health care professional if they continue or are bothersome):  back pain  changes in taste  decreased appetite  dry skin  nausea  tiredness This list may not describe all possible side effects. Call your doctor for medical advice about side effects. You may report side effects to FDA at 1-800-FDA-1088. Where should I keep my medicine? This drug is given in a hospital or clinic and will not be stored at home. NOTE: This sheet is a summary. It may not cover all possible information. If you have questions about this medicine, talk to your doctor, pharmacist, or health care provider.  2020 Elsevier/Gold Standard (2016-11-26 14:33:29) Oxaliplatin Injection What is this medicine? OXALIPLATIN (ox AL i PLA tin) is a chemotherapy drug. It targets fast dividing cells, like cancer cells, and causes these cells to die. This medicine is used to treat cancers of the colon and rectum, and many other cancers. This medicine may be used for other purposes; ask your health care provider or pharmacist if you have questions. COMMON BRAND NAME(S): Eloxatin What should I tell my health care provider before I take this medicine? They need to know if you have any of these conditions:  kidney disease  an unusual or allergic reaction to oxaliplatin, other chemotherapy, other medicines, foods, dyes, or preservatives  pregnant or trying to get pregnant  breast-feeding How should I use this medicine? This drug is given as an infusion into a vein. It is administered in a  hospital or clinic by a specially trained health care professional. Talk to your pediatrician regarding the use of this medicine in children. Special care may be needed. Overdosage: If you think you have taken too much of this medicine contact a poison control center or emergency room at once. NOTE: This medicine is only for you. Do not share this medicine with others. What if I miss a dose? It is important not to miss a dose. Call your doctor or health care professional if you are unable to keep an appointment. What may interact with this medicine?  medicines to increase blood counts like filgrastim, pegfilgrastim, sargramostim  probenecid  some antibiotics like amikacin, gentamicin, neomycin, polymyxin B, streptomycin, tobramycin  zalcitabine Talk to your doctor or health care professional before taking any of these medicines:  acetaminophen  aspirin  ibuprofen  ketoprofen  naproxen This list may not describe all possible interactions. Give your health care provider a list of all the medicines, herbs, non-prescription drugs, or dietary supplements you use. Also tell them if you smoke, drink alcohol, or use illegal drugs. Some items may interact with your medicine. What should I watch for while using this medicine? Your condition will be monitored carefully while you are receiving this medicine. You will need important  blood work done while you are taking this medicine. This medicine can make you more sensitive to cold. Do not drink cold drinks or use ice. Cover exposed skin before coming in contact with cold temperatures or cold objects. When out in cold weather wear warm clothing and cover your mouth and nose to warm the air that goes into your lungs. Tell your doctor if you get sensitive to the cold. This drug may make you feel generally unwell. This is not uncommon, as chemotherapy can affect healthy cells as well as cancer cells. Report any side effects. Continue your course of  treatment even though you feel ill unless your doctor tells you to stop. In some cases, you may be given additional medicines to help with side effects. Follow all directions for their use. Call your doctor or health care professional for advice if you get a fever, chills or sore throat, or other symptoms of a cold or flu. Do not treat yourself. This drug decreases your body's ability to fight infections. Try to avoid being around people who are sick. This medicine may increase your risk to bruise or bleed. Call your doctor or health care professional if you notice any unusual bleeding. Be careful brushing and flossing your teeth or using a toothpick because you may get an infection or bleed more easily. If you have any dental work done, tell your dentist you are receiving this medicine. Avoid taking products that contain aspirin, acetaminophen, ibuprofen, naproxen, or ketoprofen unless instructed by your doctor. These medicines may hide a fever. Do not become pregnant while taking this medicine. Women should inform their doctor if they wish to become pregnant or think they might be pregnant. There is a potential for serious side effects to an unborn child. Talk to your health care professional or pharmacist for more information. Do not breast-feed an infant while taking this medicine. Call your doctor or health care professional if you get diarrhea. Do not treat yourself. What side effects may I notice from receiving this medicine? Side effects that you should report to your doctor or health care professional as soon as possible:  allergic reactions like skin rash, itching or hives, swelling of the face, lips, or tongue  low blood counts - This drug may decrease the number of white blood cells, red blood cells and platelets. You may be at increased risk for infections and bleeding.  signs of infection - fever or chills, cough, sore throat, pain or difficulty passing urine  signs of decreased  platelets or bleeding - bruising, pinpoint red spots on the skin, black, tarry stools, nosebleeds  signs of decreased red blood cells - unusually weak or tired, fainting spells, lightheadedness  breathing problems  chest pain, pressure  cough  diarrhea  jaw tightness  mouth sores  nausea and vomiting  pain, swelling, redness or irritation at the injection site  pain, tingling, numbness in the hands or feet  problems with balance, talking, walking  redness, blistering, peeling or loosening of the skin, including inside the mouth  trouble passing urine or change in the amount of urine Side effects that usually do not require medical attention (report to your doctor or health care professional if they continue or are bothersome):  changes in vision  constipation  hair loss  loss of appetite  metallic taste in the mouth or changes in taste  stomach pain This list may not describe all possible side effects. Call your doctor for medical advice about side effects. You  may report side effects to FDA at 1-800-FDA-1088. Where should I keep my medicine? This drug is given in a hospital or clinic and will not be stored at home. NOTE: This sheet is a summary. It may not cover all possible information. If you have questions about this medicine, talk to your doctor, pharmacist, or health care provider.  2020 Elsevier/Gold Standard (2008-06-25 17:22:47) Fluorouracil, 5-FU injection What is this medicine? FLUOROURACIL, 5-FU (flure oh YOOR a sil) is a chemotherapy drug. It slows the growth of cancer cells. This medicine is used to treat many types of cancer like breast cancer, colon or rectal cancer, pancreatic cancer, and stomach cancer. This medicine may be used for other purposes; ask your health care provider or pharmacist if you have questions. COMMON BRAND NAME(S): Adrucil What should I tell my health care provider before I take this medicine? They need to know if you have any  of these conditions:  blood disorders  dihydropyrimidine dehydrogenase (DPD) deficiency  infection (especially a virus infection such as chickenpox, cold sores, or herpes)  kidney disease  liver disease  malnourished, poor nutrition  recent or ongoing radiation therapy  an unusual or allergic reaction to fluorouracil, other chemotherapy, other medicines, foods, dyes, or preservatives  pregnant or trying to get pregnant  breast-feeding How should I use this medicine? This drug is given as an infusion or injection into a vein. It is administered in a hospital or clinic by a specially trained health care professional. Talk to your pediatrician regarding the use of this medicine in children. Special care may be needed. Overdosage: If you think you have taken too much of this medicine contact a poison control center or emergency room at once. NOTE: This medicine is only for you. Do not share this medicine with others. What if I miss a dose? It is important not to miss your dose. Call your doctor or health care professional if you are unable to keep an appointment. What may interact with this medicine?  allopurinol  cimetidine  dapsone  digoxin  hydroxyurea  leucovorin  levamisole  medicines for seizures like ethotoin, fosphenytoin, phenytoin  medicines to increase blood counts like filgrastim, pegfilgrastim, sargramostim  medicines that treat or prevent blood clots like warfarin, enoxaparin, and dalteparin  methotrexate  metronidazole  pyrimethamine  some other chemotherapy drugs like busulfan, cisplatin, estramustine, vinblastine  trimethoprim  trimetrexate  vaccines Talk to your doctor or health care professional before taking any of these medicines:  acetaminophen  aspirin  ibuprofen  ketoprofen  naproxen This list may not describe all possible interactions. Give your health care provider a list of all the medicines, herbs, non-prescription  drugs, or dietary supplements you use. Also tell them if you smoke, drink alcohol, or use illegal drugs. Some items may interact with your medicine. What should I watch for while using this medicine? Visit your doctor for checks on your progress. This drug may make you feel generally unwell. This is not uncommon, as chemotherapy can affect healthy cells as well as cancer cells. Report any side effects. Continue your course of treatment even though you feel ill unless your doctor tells you to stop. In some cases, you may be given additional medicines to help with side effects. Follow all directions for their use. Call your doctor or health care professional for advice if you get a fever, chills or sore throat, or other symptoms of a cold or flu. Do not treat yourself. This drug decreases your body's ability to fight  infections. Try to avoid being around people who are sick. This medicine may increase your risk to bruise or bleed. Call your doctor or health care professional if you notice any unusual bleeding. Be careful brushing and flossing your teeth or using a toothpick because you may get an infection or bleed more easily. If you have any dental work done, tell your dentist you are receiving this medicine. Avoid taking products that contain aspirin, acetaminophen, ibuprofen, naproxen, or ketoprofen unless instructed by your doctor. These medicines may hide a fever. Do not become pregnant while taking this medicine. Women should inform their doctor if they wish to become pregnant or think they might be pregnant. There is a potential for serious side effects to an unborn child. Talk to your health care professional or pharmacist for more information. Do not breast-feed an infant while taking this medicine. Men should inform their doctor if they wish to father a child. This medicine may lower sperm counts. Do not treat diarrhea with over the counter products. Contact your doctor if you have diarrhea that  lasts more than 2 days or if it is severe and watery. This medicine can make you more sensitive to the sun. Keep out of the sun. If you cannot avoid being in the sun, wear protective clothing and use sunscreen. Do not use sun lamps or tanning beds/booths. What side effects may I notice from receiving this medicine? Side effects that you should report to your doctor or health care professional as soon as possible:  allergic reactions like skin rash, itching or hives, swelling of the face, lips, or tongue  low blood counts - this medicine may decrease the number of white blood cells, red blood cells and platelets. You may be at increased risk for infections and bleeding.  signs of infection - fever or chills, cough, sore throat, pain or difficulty passing urine  signs of decreased platelets or bleeding - bruising, pinpoint red spots on the skin, black, tarry stools, blood in the urine  signs of decreased red blood cells - unusually weak or tired, fainting spells, lightheadedness  breathing problems  changes in vision  chest pain  mouth sores  nausea and vomiting  pain, swelling, redness at site where injected  pain, tingling, numbness in the hands or feet  redness, swelling, or sores on hands or feet  stomach pain  unusual bleeding Side effects that usually do not require medical attention (report to your doctor or health care professional if they continue or are bothersome):  changes in finger or toe nails  diarrhea  dry or itchy skin  hair loss  headache  loss of appetite  sensitivity of eyes to the light  stomach upset  unusually teary eyes This list may not describe all possible side effects. Call your doctor for medical advice about side effects. You may report side effects to FDA at 1-800-FDA-1088. Where should I keep my medicine? This drug is given in a hospital or clinic and will not be stored at home. NOTE: This sheet is a summary. It may not cover all  possible information. If you have questions about this medicine, talk to your doctor, pharmacist, or health care provider.  2020 Elsevier/Gold Standard (2008-04-03 13:53:16) Leucovorin injection What is this medicine? LEUCOVORIN (loo koe VOR in) is used to prevent or treat the harmful effects of some medicines. This medicine is used to treat anemia caused by a low amount of folic acid in the body. It is also used with 5-fluorouracil (  5-FU) to treat colon cancer. This medicine may be used for other purposes; ask your health care provider or pharmacist if you have questions. What should I tell my health care provider before I take this medicine? They need to know if you have any of these conditions:  anemia from low levels of vitamin B-12 in the blood  an unusual or allergic reaction to leucovorin, folic acid, other medicines, foods, dyes, or preservatives  pregnant or trying to get pregnant  breast-feeding How should I use this medicine? This medicine is for injection into a muscle or into a vein. It is given by a health care professional in a hospital or clinic setting. Talk to your pediatrician regarding the use of this medicine in children. Special care may be needed. Overdosage: If you think you have taken too much of this medicine contact a poison control center or emergency room at once. NOTE: This medicine is only for you. Do not share this medicine with others. What if I miss a dose? This does not apply. What may interact with this medicine?  capecitabine  fluorouracil  phenobarbital  phenytoin  primidone  trimethoprim-sulfamethoxazole This list may not describe all possible interactions. Give your health care provider a list of all the medicines, herbs, non-prescription drugs, or dietary supplements you use. Also tell them if you smoke, drink alcohol, or use illegal drugs. Some items may interact with your medicine. What should I watch for while using this  medicine? Your condition will be monitored carefully while you are receiving this medicine. This medicine may increase the side effects of 5-fluorouracil, 5-FU. Tell your doctor or health care professional if you have diarrhea or mouth sores that do not get better or that get worse. What side effects may I notice from receiving this medicine? Side effects that you should report to your doctor or health care professional as soon as possible:  allergic reactions like skin rash, itching or hives, swelling of the face, lips, or tongue  breathing problems  fever, infection  mouth sores  unusual bleeding or bruising  unusually weak or tired Side effects that usually do not require medical attention (report to your doctor or health care professional if they continue or are bothersome):  constipation or diarrhea  loss of appetite  nausea, vomiting This list may not describe all possible side effects. Call your doctor for medical advice about side effects. You may report side effects to FDA at 1-800-FDA-1088. Where should I keep my medicine? This drug is given in a hospital or clinic and will not be stored at home. NOTE: This sheet is a summary. It may not cover all possible information. If you have questions about this medicine, talk to your doctor, pharmacist, or health care provider.  2020 Elsevier/Gold Standard (2008-06-04 16:50:29)

## 2019-06-26 NOTE — Op Note (Signed)
[facility] Gastroenterology Patient Name: Margarita Bobrowski Procedure Date: 06/26/2019 8:46 AM MRN: [id number] Account #: [id number] Date of Birth: November 04, 1951 Admit Type: Outpatient Age: 68 Room: [facility] ENDO ROOM 1 Gender: Male Note Status: Finalized Procedure:            Colonoscopy Indications:          Screening for colorectal malignant neoplasm Providers:            [name] MD, MD Referring MD:         Venetia Maxon. Elijio Miles, MD (Referring MD) Medicines:            Monitored Anesthesia Care Complications:        No immediate complications. Procedure:            Pre-Anesthesia Assessment:                       - Prior to the procedure, a History and Physical was                        performed, and patient medications, allergies and                        sensitivities were reviewed. The patient's tolerance of                        previous anesthesia was reviewed.                       - The risks and benefits of the procedure and the                        sedation options and risks were discussed with the                        patient. All questions were answered and informed                        consent was obtained.                       - ASA Grade Assessment: III - A patient with severe                        systemic disease.                       After obtaining informed consent, the colonoscope was                        passed under direct vision. Throughout the procedure,                        the patient's blood pressure, pulse, and oxygen                        saturations were monitored continuously. The                        Colonoscope was introduced through the anus and  advanced to the the cecum, identified by the                        appendiceal orifice, IC valve and transillumination.                        The colonoscopy was performed with ease. The patient                        tolerated the procedure well.  The quality of the bowel                        preparation was fair. Findings:      The perianal and digital rectal examinations were normal.      Two sessile polyps were found in the ascending colon. The polyps were 5       to 8 mm in size. These polyps were removed with a cold snare. Resection       and retrieval were complete.      Multiple small-mouthed diverticula were found in the sigmoid colon.      A sessile non-obstructing medium-sized mass was found in the distal       sigmoid colon and at 20 cm proximal to the anus. The mass was partially       circumferential (involving one-third of the lumen circumference). The       mass measured two cm in length. In addition, its diameter measured       twenty-five mm. No bleeding was present. This was biopsied with a cold       forceps for histology.      The exam was otherwise without abnormality. Impression:           - Preparation of the colon was fair.                       - Two 5 to 8 mm polyps in the ascending colon, removed                        with a cold snare. Resected and retrieved.                       - Diverticulosis in the sigmoid colon.                       - Rule out malignancy, tumor in the distal sigmoid                        colon and at 20 cm proximal to the anus. Biopsied.                       - The examination was otherwise normal. Recommendation:       - Discharge patient to home (with escort).                       - Resume previous diet.                       - Continue present medications.                       - Await pathology  results. Procedure Code(s):    --- Professional ---                       [id number], Colonoscopy, flexible; with removal of tumor(s),                        polyp(s), or other lesion(s) by snare technique                       45380, 15, Colonoscopy, flexible; with biopsy, single                        or multiple Diagnosis Code(s):    --- Professional ---                        Z12.11, Encounter for screening for malignant neoplasm                        of colon                       K63.5, Polyp of colon                       D49.0, Neoplasm of unspecified behavior of digestive                        system                       K57.30, Diverticulosis of large intestine without                        perforation or abscess without bleeding CPT copyright 2019 [employer]. All rights reserved. The codes documented in this report are preliminary and upon coder review may  be revised to meet current compliance requirements. [name], MD [name] MD, MD 06/26/2019 9:19:26 AM This report has been signed electronically. Number of Addenda: 0 Note Initiated On: 06/26/2019 8:46 AM Scope Withdrawal Time: 0 hours 18 minutes 7 seconds  Total Procedure Duration: 0 hours 24 minutes 6 seconds  Estimated Blood Loss: Estimated blood loss: none.      [facility]

## 2019-06-26 NOTE — Anesthesia Post-op Follow-up Note (Signed)
Anesthesia QCDR form completed.        

## 2019-06-26 NOTE — Transfer of Care (Signed)
Immediate Anesthesia Transfer of Care Note  Patient: Eric Simon  Procedure(s) Performed: COLONOSCOPY WITH PROPOFOL (N/A )  Patient Location: Endoscopy Unit  Anesthesia Type:General  Level of Consciousness: awake, alert , oriented and patient cooperative  Airway & Oxygen Therapy: Patient Spontanous Breathing  Post-op Assessment: Report given to RN and Post -op Vital signs reviewed and stable  Post vital signs: Reviewed and stable  Last Vitals:  Vitals Value Taken Time  BP 105/54 06/26/19 0920  Temp    Pulse 78 06/26/19 0921  Resp 26 06/26/19 0921  SpO2 100 % 06/26/19 0921  Vitals shown include unvalidated device data.  Last Pain:  Vitals:   06/26/19 0748  TempSrc: Tympanic  PainSc: 5          Complications: No apparent anesthesia complications

## 2019-06-26 NOTE — Anesthesia Preprocedure Evaluation (Signed)
Anesthesia Evaluation  Patient identified by MRN, date of birth, ID band Patient awake    Reviewed: Allergy & Precautions, NPO status , Patient's Chart, lab work & pertinent test results  Airway Mallampati: II  TM Distance: >3 FB     Dental   Pulmonary COPD, Current Smoker,    Pulmonary exam normal        Cardiovascular negative cardio ROS Normal cardiovascular exam     Neuro/Psych negative neurological ROS  negative psych ROS   GI/Hepatic Neg liver ROS,   Endo/Other  negative endocrine ROS  Renal/GU negative Renal ROS  negative genitourinary   Musculoskeletal negative musculoskeletal ROS (+)   Abdominal Normal abdominal exam  (+)   Peds negative pediatric ROS (+)  Hematology negative hematology ROS (+)   Anesthesia Other Findings Past Medical History: No date: COPD (chronic obstructive pulmonary disease) (HCC) No date: Hyperlipidemia  Reproductive/Obstetrics                             Anesthesia Physical Anesthesia Plan  ASA: III  Anesthesia Plan: General   Post-op Pain Management:    Induction: Intravenous  PONV Risk Score and Plan:   Airway Management Planned: Nasal Cannula  Additional Equipment:   Intra-op Plan:   Post-operative Plan:   Informed Consent: I have reviewed the patients History and Physical, chart, labs and discussed the procedure including the risks, benefits and alternatives for the proposed anesthesia with the patient or authorized representative who has indicated his/her understanding and acceptance.     Dental advisory given  Plan Discussed with: CRNA and Surgeon  Anesthesia Plan Comments:         Anesthesia Quick Evaluation

## 2019-06-27 ENCOUNTER — Other Ambulatory Visit: Payer: Self-pay

## 2019-06-27 ENCOUNTER — Encounter: Payer: Self-pay | Admitting: Gastroenterology

## 2019-06-27 ENCOUNTER — Inpatient Hospital Stay: Payer: Medicare HMO

## 2019-06-27 ENCOUNTER — Encounter: Payer: Self-pay | Admitting: Oncology

## 2019-06-27 DIAGNOSIS — C189 Malignant neoplasm of colon, unspecified: Secondary | ICD-10-CM

## 2019-06-27 DIAGNOSIS — C772 Secondary and unspecified malignant neoplasm of intra-abdominal lymph nodes: Secondary | ICD-10-CM

## 2019-06-27 LAB — SURGICAL PATHOLOGY

## 2019-06-27 NOTE — Research (Signed)
Consent for [employer] [facility] [employer] protocol briefly reviewed with patient via telephone today. He is interested in participating in the study. Copy of the consent / hipaa, clinical trial brochure and research RN business cards mailed to his home for review prior to office visit on Monday. He is agreeable to having blood drawn with his labs prior to starting his treatment. Yolande Jolly, RN, MHA, OCN will see him to sign consent on Monday and give him his gift card for his participation. The patient knows to call our office for any questions he may have.

## 2019-06-27 NOTE — Anesthesia Postprocedure Evaluation (Signed)
Anesthesia Post Note  Patient: Eric Simon  Procedure(s) Performed: COLONOSCOPY WITH PROPOFOL (N/A )  Patient location during evaluation: Endoscopy Anesthesia Type: General Level of consciousness: awake and alert and oriented Pain management: pain level controlled Vital Signs Assessment: post-procedure vital signs reviewed and stable Respiratory status: spontaneous breathing Cardiovascular status: blood pressure returned to baseline Anesthetic complications: no     Last Vitals:  Vitals:   06/26/19 0940 06/26/19 0950  BP: (!) 141/84 (!) 146/86  Pulse: 75 80  Resp: 19 (!) 22  Temp:    SpO2: 100% 98%    Last Pain:  Vitals:   06/26/19 0748  TempSrc: Tympanic  PainSc: 5                  Nicoli Nardozzi

## 2019-06-28 ENCOUNTER — Encounter: Payer: Self-pay | Admitting: Oncology

## 2019-06-28 LAB — SURGICAL PATHOLOGY

## 2019-06-29 ENCOUNTER — Encounter: Payer: Self-pay | Admitting: Oncology

## 2019-07-02 ENCOUNTER — Inpatient Hospital Stay: Payer: Medicare HMO

## 2019-07-02 ENCOUNTER — Other Ambulatory Visit: Payer: Self-pay

## 2019-07-02 ENCOUNTER — Encounter: Payer: Self-pay | Admitting: *Deleted

## 2019-07-02 ENCOUNTER — Inpatient Hospital Stay (HOSPITAL_BASED_OUTPATIENT_CLINIC_OR_DEPARTMENT_OTHER): Payer: Medicare HMO | Admitting: Oncology

## 2019-07-02 VITALS — BP 109/66 | HR 94 | Temp 99.3°F | Resp 16 | Wt 145.2 lb

## 2019-07-02 DIAGNOSIS — C786 Secondary malignant neoplasm of retroperitoneum and peritoneum: Secondary | ICD-10-CM

## 2019-07-02 DIAGNOSIS — C772 Secondary and unspecified malignant neoplasm of intra-abdominal lymph nodes: Secondary | ICD-10-CM

## 2019-07-02 DIAGNOSIS — C189 Malignant neoplasm of colon, unspecified: Secondary | ICD-10-CM

## 2019-07-02 DIAGNOSIS — Z5112 Encounter for antineoplastic immunotherapy: Secondary | ICD-10-CM | POA: Diagnosis not present

## 2019-07-02 DIAGNOSIS — Z5111 Encounter for antineoplastic chemotherapy: Secondary | ICD-10-CM

## 2019-07-02 DIAGNOSIS — Z95828 Presence of other vascular implants and grafts: Secondary | ICD-10-CM

## 2019-07-02 LAB — CBC WITH DIFFERENTIAL/PLATELET
Abs Immature Granulocytes: 0.09 10*3/uL — ABNORMAL HIGH (ref 0.00–0.07)
Basophils Absolute: 0.1 10*3/uL (ref 0.0–0.1)
Basophils Relative: 1 %
Eosinophils Absolute: 0.2 10*3/uL (ref 0.0–0.5)
Eosinophils Relative: 2 %
HCT: 42.5 % (ref 39.0–52.0)
Hemoglobin: 14.4 g/dL (ref 13.0–17.0)
Immature Granulocytes: 1 %
Lymphocytes Relative: 18 %
Lymphs Abs: 1.9 10*3/uL (ref 0.7–4.0)
MCH: 29.6 pg (ref 26.0–34.0)
MCHC: 33.9 g/dL (ref 30.0–36.0)
MCV: 87.4 fL (ref 80.0–100.0)
Monocytes Absolute: 1 10*3/uL (ref 0.1–1.0)
Monocytes Relative: 10 %
Neutro Abs: 7.4 10*3/uL (ref 1.7–7.7)
Neutrophils Relative %: 68 %
Platelets: 351 10*3/uL (ref 150–400)
RBC: 4.86 MIL/uL (ref 4.22–5.81)
RDW: 13.5 % (ref 11.5–15.5)
WBC: 10.6 10*3/uL — ABNORMAL HIGH (ref 4.0–10.5)
nRBC: 0 % (ref 0.0–0.2)

## 2019-07-02 LAB — COMPREHENSIVE METABOLIC PANEL
ALT: 58 U/L — ABNORMAL HIGH (ref 0–44)
AST: 37 U/L (ref 15–41)
Albumin: 3.4 g/dL — ABNORMAL LOW (ref 3.5–5.0)
Alkaline Phosphatase: 104 U/L (ref 38–126)
Anion gap: 11 (ref 5–15)
BUN: 18 mg/dL (ref 8–23)
CO2: 24 mmol/L (ref 22–32)
Calcium: 9.3 mg/dL (ref 8.9–10.3)
Chloride: 102 mmol/L (ref 98–111)
Creatinine, Ser: 0.74 mg/dL (ref 0.61–1.24)
GFR calc Af Amer: >60 mL/min (ref >60)
GFR calc non Af Amer: >60 mL/min (ref >60)
Glucose, Bld: 166 mg/dL — ABNORMAL HIGH (ref 70–99)
Potassium: 3.7 mmol/L (ref 3.5–5.1)
Sodium: 137 mmol/L (ref 135–145)
Total Bilirubin: 0.5 mg/dL (ref 0.3–1.2)
Total Protein: 7.4 g/dL (ref 6.5–8.1)

## 2019-07-02 LAB — PROTEIN, URINE, RANDOM: Total Protein, Urine: 17 mg/dL

## 2019-07-02 MED ORDER — OXALIPLATIN CHEMO INJECTION 100 MG/20ML
85.0000 mg/m2 | Freq: Once | INTRAVENOUS | Status: AC
  Administered 2019-07-02: 150 mg via INTRAVENOUS
  Filled 2019-07-02: qty 20

## 2019-07-02 MED ORDER — SODIUM CHLORIDE 0.9 % IV SOLN
10.0000 mg | Freq: Once | INTRAVENOUS | Status: DC
  Filled 2019-07-02: qty 1

## 2019-07-02 MED ORDER — DEXAMETHASONE SODIUM PHOSPHATE 10 MG/ML IJ SOLN
10.0000 mg | Freq: Once | INTRAMUSCULAR | Status: AC
  Administered 2019-07-02: 10 mg via INTRAVENOUS
  Filled 2019-07-02: qty 1

## 2019-07-02 MED ORDER — FLUOROURACIL CHEMO INJECTION 2.5 GM/50ML
400.0000 mg/m2 | Freq: Once | INTRAVENOUS | Status: AC
  Administered 2019-07-02: 700 mg via INTRAVENOUS
  Filled 2019-07-02: qty 14

## 2019-07-02 MED ORDER — SODIUM CHLORIDE 0.9 % IV SOLN
2400.0000 mg/m2 | INTRAVENOUS | Status: DC
  Administered 2019-07-02: 4300 mg via INTRAVENOUS
  Filled 2019-07-02: qty 86

## 2019-07-02 MED ORDER — LEUCOVORIN CALCIUM INJECTION 350 MG
391.0000 mg/m2 | Freq: Once | INTRAVENOUS | Status: AC
  Administered 2019-07-02: 700 mg via INTRAVENOUS
  Filled 2019-07-02: qty 10

## 2019-07-02 MED ORDER — DEXTROSE 5 % IV SOLN
Freq: Once | INTRAVENOUS | Status: AC
  Administered 2019-07-02: 13:00:00 via INTRAVENOUS
  Filled 2019-07-02: qty 250

## 2019-07-02 MED ORDER — PALONOSETRON HCL INJECTION 0.25 MG/5ML
0.2500 mg | Freq: Once | INTRAVENOUS | Status: AC
  Administered 2019-07-02: 0.25 mg via INTRAVENOUS
  Filled 2019-07-02: qty 5

## 2019-07-02 MED ORDER — SODIUM CHLORIDE 0.9% FLUSH
10.0000 mL | Freq: Once | INTRAVENOUS | Status: AC
  Administered 2019-07-02: 10 mL via INTRAVENOUS
  Filled 2019-07-02: qty 10

## 2019-07-02 MED ORDER — SODIUM CHLORIDE 0.9 % IV SOLN
5.0000 mg/kg | Freq: Once | INTRAVENOUS | Status: AC
  Administered 2019-07-02: 350 mg via INTRAVENOUS
  Filled 2019-07-02: qty 14

## 2019-07-02 NOTE — Research (Signed)
[name] presented to clinic this morning for his first chemotherapy infusion with [employer] for his newly diagnosed Colon cancer. He has been mailed a copy of the ICF for the [facility] study and has previously agreed to participate. Asked patient if he has any questions about the consent form and he denies having any further questions. He is aware that participation is strictly voluntary and he is free to withdraw at any time. He also understands that we will collect 3 additional tubes of blood along with his regular labs this morning for the study, and that he will receive a $50 gift card for participating. Informed consent form approval date 06/30/2019 originally signed by patient. Discovered later that this version had expired and patient was asked to re-consent for the specimen collection under to current version approved on 06/05/2019. Correlating HIPAA authorization form approved 06/05/2019 also signed by patient and he was given copies of all consents signed. Patient had study labs collected by Tamala Fothergill, RN in the lab and patient was provided a $50 [employer] gift card, for which he signed as having received. Patient was thanked for participating in research. He was also provided a copy of the signature sheet stating that he received the gift card. Yolande Jolly, BSN, MHA, OCN 07/02/2019  9:20 AM   [facility] called and they have picked up lab specimens and will transport to [facility]. Yolande Jolly, BSN, MHA, OCN 07/02/2019 11:30 AM

## 2019-07-02 NOTE — Progress Notes (Signed)
Spoke with Dr. [name], she is ok with continuing prior to having the urine protein results back. Will verify.  Larene Beach, PharmD

## 2019-07-03 ENCOUNTER — Telehealth: Payer: Self-pay

## 2019-07-03 ENCOUNTER — Encounter: Payer: Self-pay | Admitting: Oncology

## 2019-07-03 NOTE — Progress Notes (Signed)
Hematology/Oncology Consult note [facility]  [phone] [phone]  Patient Care Team: Jodi Marble, MD as PCP - General (Internal Medicine)   Name of the patient: Eric Simon  [id number]  12/02/1951   Date of visit: 07/03/19  Diagnosis-metastatic colon cancer with lymph node metastases K-ras/BRAF wild-type  Chief complaint/ Reason for visit-on treatment assessment prior to cycle 1 of FOLFOX Avastin chemotherapy  Heme/Onc history: Patient is a 68 yr old male with >40 pack year history of smoking. He currently smokes 0.5ppd. he presented to the ER with symptoms of left sided chest pain and left arm pain. Troponin was negative ekg was unremarkable. Ct chest showed no PE. He was incidentally noted to have mediastinal and retrocrural adenopathy and a 2.1X2.3X4.4 cm retroaortic soft tissue lesion in the posterior left chest. 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  Low-grade activity in the left lateral fifth and sixth ribs associated with nondisplaced fractures likely reflecting healing response problem malignancy.  Patient underwent CT-guided biopsy of the retroperitoneal lymph node pathology showed metastatic adenocarcinoma compatible with colorectal origin.  CK7 negative.  CK20 positive.  CDX 2+.  TTF-1 negative.  PSA negative.  This pattern of immunoreactivity supports the above diagnosis. Patient underwent colonoscopy which showed sigmoid mass that was consistent with adenocarcinoma.  Rast panel testing showed that he was wild-type for both K-ras and BRAF  Interval history-currently he reports feeling well overall.  He does have some mild fatigue.  He did have some weight loss but feels that his appetite is getting better.  ECOG PS- 1 Pain scale- 0   Review of systems- Review of Systems  Constitutional:  Positive for malaise/fatigue. Negative for chills, fever and weight loss.  HENT: Negative for congestion, ear discharge and nosebleeds.   Eyes: Negative for blurred vision.  Respiratory: Negative for cough, hemoptysis, sputum production, shortness of breath and wheezing.   Cardiovascular: Negative for chest pain, palpitations, orthopnea and claudication.  Gastrointestinal: Negative for abdominal pain, blood in stool, constipation, diarrhea, heartburn, melena, nausea and vomiting.  Genitourinary: Negative for dysuria, flank pain, frequency, hematuria and urgency.  Musculoskeletal: Negative for back pain, joint pain and myalgias.  Skin: Negative for rash.  Neurological: Negative for dizziness, tingling, focal weakness, seizures, weakness and headaches.  Endo/Heme/Allergies: Does not bruise/bleed easily.  Psychiatric/Behavioral: Negative for depression and suicidal ideas. The patient does not have insomnia.       No Known Allergies   Past Medical History:  Diagnosis Date   COPD (chronic obstructive pulmonary disease) ([place])    Hyperlipidemia      Past Surgical History:  Procedure Laterality Date   COLONOSCOPY WITH PROPOFOL N/A 06/26/2019   Procedure: COLONOSCOPY WITH PROPOFOL;  Surgeon: [name], MD;  Location: [facility] ENDOSCOPY;  Service: Gastroenterology;  Laterality: N/A;   PORTA CATH INSERTION N/A 06/25/2019   Procedure: PORTA CATH INSERTION;  Surgeon: Algernon Huxley, MD;  Location: [facility];  Service: Cardiovascular;  Laterality: N/A;    Social History   Socioeconomic History   Marital status: Single    Spouse name: Not on file   Number of children: Not on file   Years of education: Not on file   Highest education level: Not on file  Occupational History   Not on file  Social Needs   Financial resource strain: Not hard at all   Food insecurity  Worry: Never true    Inability: Never true   Transportation needs    Medical: No    Non-medical: No    Tobacco Use   Smoking status: Current Every Day Smoker    Packs/day: 0.50   Smokeless tobacco: Never Used   Tobacco comment: smoking 40 years  Substance and Sexual Activity   Alcohol use: No   Drug use: No   Sexual activity: Not Currently  Lifestyle   Physical activity    Days per week: Not on file    Minutes per session: Not on file   Stress: Not at all  Relationships   Social connections    Talks on phone: More than three times a week    Gets together: Not on file    Attends religious service: Not on file    Active member of club or organization: Not on file    Attends meetings of clubs or organizations: Not on file    Relationship status: Not on file   Intimate partner violence    Fear of current or ex partner: No    Emotionally abused: No    Physically abused: No    Forced sexual activity: No  Other Topics Concern   Not on file  Social History Narrative   Not on file    Family History  Problem Relation Age of Onset   Brain cancer Father      Current Outpatient Medications:    aspirin EC 81 MG tablet, Take 81 mg by mouth daily., Disp: , Rfl:    Budesonide-Formoterol Fumarate (SYMBICORT IN), Inhale into the lungs., Disp: , Rfl:    Butalbital-Acetaminophen (BUTALBITAL-APAP PO), Take by mouth., Disp: , Rfl:    cetirizine (ZYRTEC) 10 MG tablet, Take 1 tablet (10 mg total) by mouth daily., Disp: 30 tablet, Rfl: 0   dexamethasone (DECADRON) 4 MG tablet, Take 2 tablets (8 mg total) by mouth daily. Start the day after chemotherapy for 2 days. Take with food., Disp: 30 tablet, Rfl: 1   fluticasone (VERAMYST) 27.5 MCG/SPRAY nasal spray, Place 2 sprays into the nose daily., Disp: , Rfl:    lidocaine-prilocaine (EMLA) cream, Apply to affected area once, Disp: 30 g, Rfl: 3   methylPREDNISolone (MEDROL DOSEPAK) 4 MG TBPK tablet, FOLLOW DIRECTIONS ON PACKAGED AS DIRECTED PER MD, Disp: , Rfl:    montelukast (SINGULAIR) 10 MG tablet, Take 10 mg by mouth at  bedtime., Disp: , Rfl:    ondansetron (ZOFRAN) 8 MG tablet, Take 1 tablet (8 mg total) by mouth 2 (two) times daily as needed for refractory nausea / vomiting. Start on day 3 after chemotherapy., Disp: 30 tablet, Rfl: 1   pantoprazole (PROTONIX) 40 MG tablet, Take 40 mg by mouth daily., Disp: , Rfl:    prochlorperazine (COMPAZINE) 10 MG tablet, Take 1 tablet (10 mg total) by mouth every 6 (six) hours as needed (Nausea or vomiting)., Disp: 30 tablet, Rfl: 1   simvastatin (ZOCOR) 20 MG tablet, Take 20 mg by mouth daily., Disp: , Rfl:    tamsulosin (FLOMAX) 0.4 MG CAPS capsule, Take 0.4 mg by mouth., Disp: , Rfl:    cyclobenzaprine (FLEXERIL) 5 MG tablet, Take 1 tablet (5 mg total) by mouth 3 (three) times daily as needed for muscle spasms. (Patient not taking: Reported on 07/02/2019), Disp: 10 tablet, Rfl: 0   Na Sulfate-K Sulfate-Mg Sulf (SUPREP BOWEL PREP KIT) 17.5-3.13-1.6 GM/177ML SOLN, Take 1 kit by mouth as directed. (Patient not taking: Reported on 07/02/2019), Disp: 177 mL,  Rfl: 0   naproxen (NAPROSYN) 375 MG tablet, Take 1 tablet (375 mg total) by mouth 2 (two) times daily with a meal. (Patient not taking: Reported on 07/02/2019), Disp: 10 tablet, Rfl: 0   traMADol-acetaminophen (ULTRACET) 37.5-325 MG tablet, Take 1 tablet by mouth every 6 (six) hours., Disp: , Rfl:   Physical exam:  Vitals:   07/02/19 0947  BP: 109/66  Pulse: 94  Resp: 16  Temp: 99.3 F (37.4 C)  TempSrc: Tympanic  Weight: 145 lb 3.2 oz (65.9 kg)   Physical Exam Constitutional:      Comments: Thin man in no acute distress  HENT:     Head: Normocephalic and atraumatic.  Eyes:     Pupils: Pupils are equal, round, and reactive to light.  Neck:     Musculoskeletal: Normal range of motion.  Cardiovascular:     Rate and Rhythm: Normal rate and regular rhythm.     Heart sounds: Normal heart sounds.  Pulmonary:     Effort: Pulmonary effort is normal.     Breath sounds: Normal breath sounds.  Abdominal:      General: Bowel sounds are normal.     Palpations: Abdomen is soft.  Skin:    General: Skin is warm and dry.  Neurological:     Mental Status: He is alert and oriented to person, place, and time.      CMP Latest Ref Rng & Units 07/02/2019  Glucose 70 - 99 mg/dL 166(H)  BUN 8 - 23 mg/dL 18  Creatinine 0.61 - 1.24 mg/dL 0.74  Sodium 135 - 145 mmol/L 137  Potassium 3.5 - 5.1 mmol/L 3.7  Chloride 98 - 111 mmol/L 102  CO2 22 - 32 mmol/L 24  Calcium 8.9 - 10.3 mg/dL 9.3  Total Protein 6.5 - 8.1 g/dL 7.4  Total Bilirubin 0.3 - 1.2 mg/dL 0.5  Alkaline Phos 38 - 126 U/L 104  AST 15 - 41 U/L 37  ALT 0 - 44 U/L 58(H)   CBC Latest Ref Rng & Units 07/02/2019  WBC 4.0 - 10.5 K/uL 10.6(H)  Hemoglobin 13.0 - 17.0 g/dL 14.4  Hematocrit 39.0 - 52.0 % 42.5  Platelets 150 - 400 K/uL 351    No images are attached to the encounter.  Nm Pet Image Initial (pi) Skull Base To Thigh  Result Date: 06/06/2019 CLINICAL DATA:  Initial treatment strategy for thoracic and upper abdominal adenopathy. EXAM: NUCLEAR MEDICINE PET SKULL BASE TO THIGH TECHNIQUE: 8.2 mCi F-18 FDG was injected intravenously. Full-ring PET imaging was performed from the skull base to thigh after the radiotracer. CT data was obtained and used for attenuation correction and anatomic localization. Fasting blood glucose: 79 mg/dl COMPARISON:  Chest CT 05/24/2019 FINDINGS: Mediastinal blood pool activity: SUV max 2.2 Liver activity: SUV max 3.7 NECK: No significant abnormal hypermetabolic activity in this region. Incidental CT findings: Prosthetic right globe. Mild right common carotid artery atherosclerotic calcification. CHEST: Mildly hypermetabolic left supraclavicular, upper mediastinal prevascular, and periaortic adenopathy is identified. Left supraclavicular node 0.7 cm in short axis on image 66/3, maximum SUV 3.1 (Deauville 3). Prevascular node in the upper mediastinum measures 0.9 cm in short axis the indistinctly marginated left posterior  periaortic lesion measuring 1.2 by 1.6 cm has maximum SUV of 3.4, Deauville 3. Faintly accentuated left hilar activity observed, maximum SUV 3.4, Deauville 3. Just posterior to the esophagus as it enters the hiatus, a lower thoracic para aortic lymph node measures 1.3 cm in short axis on image 139/3, maximum  SUV 6.7, Deauville 4. There is also a right retro diaphragmatic lymph node along the pleural margin measuring 0.7 cm in short axis on image 137/3 with maximum SUV 3.5, Deauville 3. Incidental CT findings: Centrilobular emphysema. ABDOMEN/PELVIS: Hypermetabolic retroperitoneal, bilateral common iliac, and left external iliac adenopathy with multiple enlarged and hypermetabolic lymph nodes observed. An index aortocaval node measuring 1.9 cm in short axis on image 173/3 has a maximum SUV of 9.4. A left common iliac node at about the level of the bifurcation measures 1.6 cm in short axis 8.3. A left external iliac node measuring 2.0 cm in short axis on image 231/3 has a maximum SUV of 7.8. These nodes are essentially borderline between Deauville 4 and Deauville 5. Vague activity in the right lower quadrant does not have a CT correlate and probably represents a physiologic bowel activity, less likely to be some occult mesenteric nodal activity. There is subtle activity along the genu of the left adrenal gland with maximum SUV 4.6, possibly due to an adjacent small hypermetabolic lymph node but technically nonspecific. Incidental CT findings: Aortoiliac atherosclerotic vascular disease. No splenomegaly or splenic hypermetabolic activity. SKELETON: Focally accentuated activity in the left lateral fifth rib (maximum SUV 4.7) and left sixth rib (maximum SUV 4.6) associated with nondisplaced rib fractures. Incidental CT findings: none IMPRESSION: 1. Pathologic retroperitoneal, pelvic, and thoracic adenopathy favoring malignancy such as lymphoma. Thoracic adenopathy is primarily Deauville 3 activity, with the larger lymph  nodes in the abdomen and pelvis along the borderline between Deauville 4 and Deauville 5 activity. 2. Low-grade activity focally in the left lateral fifth and sixth ribs associated with nondisplaced rib fractures, likely reflecting healing response rather than malignancy. 3. Aortic Atherosclerosis (ICD10-I70.0) and Emphysema (ICD10-J43.9). Electronically Signed   By: Van Clines M.D.   On: 06/06/2019 16:58   Ct Biopsy  Result Date: 06/12/2019 INDICATION: Retroperitoneal lymphadenopathy. EXAM: CT-DIRECTED RETROPERITONEAL LYMPH NODE BIOPSY MEDICATIONS: IV conscious sedation as below.  1% local lidocaine. ANESTHESIA/SEDATION: Moderate (conscious) sedation was employed during this procedure. A total of Versed 3 mg and Fentanyl 100 mcg was administered intravenously. Moderate Sedation Time: 20 minutes. The patient's level of consciousness and vital signs were monitored continuously by radiology nursing throughout the procedure under my direct supervision. FLUOROSCOPY TIME:  None. COMPLICATIONS: None immediate. PROCEDURE: After discussing the risks and benefits of this procedure the patient informed consent was obtained. Back was sterilely prepped and draped. Following local anesthesia 1% lidocaine and IV conscious sedation 18 gauge core biopsy needle system was advanced into a retroperitoneal lymph node and 2 core samples obtained. Samples sent to pathology in saline and formalin. Post biopsy CT revealed no complications. IMPRESSION: Successful CT-directed core biopsies of retroperitoneal left noted. Electronically Signed   By: [place]   On: 06/12/2019 11:35     Assessment and plan- Patient is a 68 y.o. male with metastatic colon cancer TX NX M1 with generalized lymphadenopathy K-ras/beta wild type.  From treatment assessment prior to cycle 1 of FOLFOX Avastin chemotherapy  Counts okay to proceed with cycle 1 of FOLFOX Avastin chemotherapy today.  His blood pressure is normal and urine protein  is trace.  He will come back on day 3 for pump disconnect.  I will see him back in 2 weeks time with CBC with differential, CMP for cycle 2.  Plan is to continue chemotherapy until progression or toxicity.  Scans to be repeated in 3 months.  Patient knows to call us if he has any questions over the  next 2 weeks.  He does have PRN nausea medications to take at home.   Visit Diagnosis 1. 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 ([place])   2. Encounter for antineoplastic chemotherapy   3. Encounter for monoclonal antibody treatment for malignancy      Dr. Randa Evens, MD, MPH [facility] at [facility] [phone] 07/03/2019 8:09 AM

## 2019-07-03 NOTE — Telephone Encounter (Signed)
Telephone call made to patient for follow up after first chemotherapy.  Patient had first chemo yesterday and states he is having no problems today.  Did report some nausea earlier but encouraged patient to take anti-nausea meds if needed.  Patient verbalized he would take it if he felt nauseated again.  Patient states he will return tomorrow to have pump removed.

## 2019-07-04 ENCOUNTER — Inpatient Hospital Stay: Payer: Medicare HMO

## 2019-07-04 ENCOUNTER — Other Ambulatory Visit: Payer: Self-pay

## 2019-07-04 DIAGNOSIS — C772 Secondary and unspecified malignant neoplasm of intra-abdominal lymph nodes: Secondary | ICD-10-CM

## 2019-07-04 DIAGNOSIS — C189 Malignant neoplasm of colon, unspecified: Secondary | ICD-10-CM

## 2019-07-04 DIAGNOSIS — Z5112 Encounter for antineoplastic immunotherapy: Secondary | ICD-10-CM | POA: Diagnosis not present

## 2019-07-04 MED ORDER — SODIUM CHLORIDE 0.9% FLUSH
10.0000 mL | INTRAVENOUS | Status: DC | PRN
  Administered 2019-07-04: 10 mL via INTRAVENOUS
  Filled 2019-07-04: qty 10

## 2019-07-04 MED ORDER — HEPARIN SOD (PORK) LOCK FLUSH 100 UNIT/ML IV SOLN
INTRAVENOUS | Status: AC
  Filled 2019-07-04: qty 5

## 2019-07-04 MED ORDER — HEPARIN SOD (PORK) LOCK FLUSH 100 UNIT/ML IV SOLN
500.0000 [IU] | Freq: Once | INTRAVENOUS | Status: AC
  Administered 2019-07-04: 500 [IU] via INTRAVENOUS

## 2019-07-06 ENCOUNTER — Telehealth: Payer: Self-pay | Admitting: Internal Medicine

## 2019-07-06 NOTE — Telephone Encounter (Signed)
Received a call from the patient- 7/22 evening that after he had his pump taken off noted to have some chest discomfort/dyspepsia.  Recommend going the emergency room.  Patient plans to take PPI/Tums-if not improved then go to the ER.

## 2019-07-08 ENCOUNTER — Encounter: Payer: Self-pay | Admitting: Gastroenterology

## 2019-07-16 ENCOUNTER — Encounter: Payer: Self-pay | Admitting: Nurse Practitioner

## 2019-07-16 ENCOUNTER — Inpatient Hospital Stay (HOSPITAL_BASED_OUTPATIENT_CLINIC_OR_DEPARTMENT_OTHER): Payer: Medicare HMO | Admitting: Nurse Practitioner

## 2019-07-16 ENCOUNTER — Inpatient Hospital Stay: Payer: Medicare HMO | Admitting: Oncology

## 2019-07-16 ENCOUNTER — Ambulatory Visit: Payer: Medicare HMO | Admitting: Oncology

## 2019-07-16 ENCOUNTER — Other Ambulatory Visit: Payer: Medicare HMO

## 2019-07-16 ENCOUNTER — Encounter: Payer: Medicare HMO | Admitting: Hospice and Palliative Medicine

## 2019-07-16 ENCOUNTER — Ambulatory Visit: Payer: Medicare HMO

## 2019-07-16 ENCOUNTER — Inpatient Hospital Stay (HOSPITAL_BASED_OUTPATIENT_CLINIC_OR_DEPARTMENT_OTHER): Payer: Medicare HMO | Admitting: Oncology

## 2019-07-16 ENCOUNTER — Other Ambulatory Visit: Payer: Self-pay | Admitting: Oncology

## 2019-07-16 ENCOUNTER — Inpatient Hospital Stay: Payer: Medicare HMO

## 2019-07-16 ENCOUNTER — Inpatient Hospital Stay (HOSPITAL_BASED_OUTPATIENT_CLINIC_OR_DEPARTMENT_OTHER): Payer: Medicare HMO | Admitting: Hospice and Palliative Medicine

## 2019-07-16 ENCOUNTER — Other Ambulatory Visit: Payer: Self-pay

## 2019-07-16 ENCOUNTER — Inpatient Hospital Stay: Payer: Medicare HMO | Attending: Oncology

## 2019-07-16 VITALS — BP 146/77 | HR 67 | Temp 97.7°F | Resp 18 | Wt 147.6 lb

## 2019-07-16 DIAGNOSIS — C772 Secondary and unspecified malignant neoplasm of intra-abdominal lymph nodes: Secondary | ICD-10-CM

## 2019-07-16 DIAGNOSIS — Z515 Encounter for palliative care: Secondary | ICD-10-CM | POA: Diagnosis not present

## 2019-07-16 DIAGNOSIS — C189 Malignant neoplasm of colon, unspecified: Secondary | ICD-10-CM

## 2019-07-16 DIAGNOSIS — Z5111 Encounter for antineoplastic chemotherapy: Secondary | ICD-10-CM | POA: Diagnosis present

## 2019-07-16 DIAGNOSIS — C187 Malignant neoplasm of sigmoid colon: Secondary | ICD-10-CM | POA: Diagnosis present

## 2019-07-16 DIAGNOSIS — K5900 Constipation, unspecified: Secondary | ICD-10-CM | POA: Diagnosis not present

## 2019-07-16 DIAGNOSIS — R070 Pain in throat: Secondary | ICD-10-CM | POA: Diagnosis not present

## 2019-07-16 DIAGNOSIS — Z452 Encounter for adjustment and management of vascular access device: Secondary | ICD-10-CM | POA: Insufficient documentation

## 2019-07-16 DIAGNOSIS — K1231 Oral mucositis (ulcerative) due to antineoplastic therapy: Secondary | ICD-10-CM | POA: Insufficient documentation

## 2019-07-16 DIAGNOSIS — G629 Polyneuropathy, unspecified: Secondary | ICD-10-CM | POA: Diagnosis not present

## 2019-07-16 DIAGNOSIS — R0981 Nasal congestion: Secondary | ICD-10-CM | POA: Insufficient documentation

## 2019-07-16 DIAGNOSIS — Z5112 Encounter for antineoplastic immunotherapy: Secondary | ICD-10-CM | POA: Diagnosis present

## 2019-07-16 DIAGNOSIS — E785 Hyperlipidemia, unspecified: Secondary | ICD-10-CM | POA: Diagnosis not present

## 2019-07-16 DIAGNOSIS — E876 Hypokalemia: Secondary | ICD-10-CM | POA: Insufficient documentation

## 2019-07-16 DIAGNOSIS — J449 Chronic obstructive pulmonary disease, unspecified: Secondary | ICD-10-CM | POA: Insufficient documentation

## 2019-07-16 LAB — CBC WITH DIFFERENTIAL/PLATELET
Abs Immature Granulocytes: 0.02 10*3/uL (ref 0.00–0.07)
Basophils Absolute: 0.1 10*3/uL (ref 0.0–0.1)
Basophils Relative: 1 %
Eosinophils Absolute: 0.5 10*3/uL (ref 0.0–0.5)
Eosinophils Relative: 8 %
HCT: 37.8 % — ABNORMAL LOW (ref 39.0–52.0)
Hemoglobin: 12.9 g/dL — ABNORMAL LOW (ref 13.0–17.0)
Immature Granulocytes: 0 %
Lymphocytes Relative: 29 %
Lymphs Abs: 1.9 10*3/uL (ref 0.7–4.0)
MCH: 29.7 pg (ref 26.0–34.0)
MCHC: 34.1 g/dL (ref 30.0–36.0)
MCV: 87.1 fL (ref 80.0–100.0)
Monocytes Absolute: 0.6 10*3/uL (ref 0.1–1.0)
Monocytes Relative: 9 %
Neutro Abs: 3.4 10*3/uL (ref 1.7–7.7)
Neutrophils Relative %: 53 %
Platelets: 163 10*3/uL (ref 150–400)
RBC: 4.34 MIL/uL (ref 4.22–5.81)
RDW: 14.2 % (ref 11.5–15.5)
WBC: 6.4 10*3/uL (ref 4.0–10.5)
nRBC: 0 % (ref 0.0–0.2)

## 2019-07-16 LAB — COMPREHENSIVE METABOLIC PANEL
ALT: 38 U/L (ref 0–44)
AST: 33 U/L (ref 15–41)
Albumin: 3.5 g/dL (ref 3.5–5.0)
Alkaline Phosphatase: 104 U/L (ref 38–126)
Anion gap: 8 (ref 5–15)
BUN: 21 mg/dL (ref 8–23)
CO2: 22 mmol/L (ref 22–32)
Calcium: 8.8 mg/dL — ABNORMAL LOW (ref 8.9–10.3)
Chloride: 107 mmol/L (ref 98–111)
Creatinine, Ser: 0.77 mg/dL (ref 0.61–1.24)
GFR calc Af Amer: >60 mL/min (ref >60)
GFR calc non Af Amer: >60 mL/min (ref >60)
Glucose, Bld: 119 mg/dL — ABNORMAL HIGH (ref 70–99)
Potassium: 4 mmol/L (ref 3.5–5.1)
Sodium: 137 mmol/L (ref 135–145)
Total Bilirubin: 0.6 mg/dL (ref 0.3–1.2)
Total Protein: 6.5 g/dL (ref 6.5–8.1)

## 2019-07-16 LAB — PROTEIN, URINE, RANDOM: Total Protein, Urine: 8 mg/dL

## 2019-07-16 MED ORDER — DEXTROSE 5 % IV SOLN
Freq: Once | INTRAVENOUS | Status: AC
  Administered 2019-07-16: 11:00:00 via INTRAVENOUS
  Filled 2019-07-16: qty 250

## 2019-07-16 MED ORDER — PALONOSETRON HCL INJECTION 0.25 MG/5ML
0.2500 mg | Freq: Once | INTRAVENOUS | Status: AC
  Administered 2019-07-16: 0.25 mg via INTRAVENOUS
  Filled 2019-07-16: qty 5

## 2019-07-16 MED ORDER — SODIUM CHLORIDE 0.9 % IV SOLN
5.0000 mg/kg | Freq: Once | INTRAVENOUS | Status: AC
  Administered 2019-07-16: 350 mg via INTRAVENOUS
  Filled 2019-07-16: qty 14

## 2019-07-16 MED ORDER — SODIUM CHLORIDE 0.9% FLUSH
10.0000 mL | Freq: Once | INTRAVENOUS | Status: AC
  Administered 2019-07-16: 10 mL via INTRAVENOUS
  Filled 2019-07-16: qty 10

## 2019-07-16 MED ORDER — OXALIPLATIN CHEMO INJECTION 100 MG/20ML
85.0000 mg/m2 | Freq: Once | INTRAVENOUS | Status: AC
  Administered 2019-07-16: 150 mg via INTRAVENOUS
  Filled 2019-07-16: qty 20

## 2019-07-16 MED ORDER — DEXAMETHASONE SODIUM PHOSPHATE 10 MG/ML IJ SOLN
10.0000 mg | Freq: Once | INTRAMUSCULAR | Status: AC
  Administered 2019-07-16: 10 mg via INTRAVENOUS
  Filled 2019-07-16: qty 1

## 2019-07-16 MED ORDER — FLUOROURACIL CHEMO INJECTION 2.5 GM/50ML
400.0000 mg/m2 | Freq: Once | INTRAVENOUS | Status: AC
  Administered 2019-07-16: 700 mg via INTRAVENOUS
  Filled 2019-07-16: qty 14

## 2019-07-16 MED ORDER — SODIUM CHLORIDE 0.9 % IV SOLN
2400.0000 mg/m2 | INTRAVENOUS | Status: DC
  Administered 2019-07-16: 4300 mg via INTRAVENOUS
  Filled 2019-07-16: qty 86

## 2019-07-16 MED ORDER — LEUCOVORIN CALCIUM INJECTION 350 MG
700.0000 mg | Freq: Once | INTRAVENOUS | Status: AC
  Administered 2019-07-16: 700 mg via INTRAVENOUS
  Filled 2019-07-16: qty 35

## 2019-07-16 MED ORDER — LEUCOVORIN CALCIUM INJECTION 350 MG: 400.0000 mg/m2 | Freq: Once | INTRAVENOUS | Status: DC

## 2019-07-16 NOTE — Progress Notes (Signed)
Hematology/Oncology Progress Note [facility]  Telephone:([phone] [phone]  Patient Care Team: Jodi Marble, MD as PCP - General (Internal Medicine)   Name of the patient: Eric Simon  [id number]  12-22-1950   Date of visit: 07/16/19  Diagnosis-metastatic colon cancer with lymph node metastases K-ras/BRAF wild-type  Chief complaint/ Reason for visit-on treatment assessment prior to cycle 2 of FOLFOX Avastin chemotherapy  Heme/Onc history: Patient is a 68 yr old male with >40 pack year history of smoking. He currently smokes 0.5ppd. he presented to the ER with symptoms of left sided chest pain and left arm pain. Troponin was negative ekg was unremarkable. Ct chest showed no PE. He was incidentally noted to have mediastinal and retrocrural adenopathy and a 2.1X2.3X4.4 cm retroaortic soft tissue lesion in the posterior left chest. 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  Low-grade activity in the left lateral fifth and sixth ribs associated with nondisplaced fractures likely reflecting healing response problem malignancy.  Patient underwent CT-guided biopsy of the retroperitoneal lymph node pathology showed metastatic adenocarcinoma compatible with colorectal origin.  CK7 negative.  CK20 positive.  CDX 2+.  TTF-1 negative.  PSA negative.  This pattern of immunoreactivity supports the above diagnosis. Patient underwent colonoscopy which showed sigmoid mass that was consistent with adenocarcinoma.  Rast panel testing showed that he was wild-type for both K-ras and BRAF  Interval history- He reports feeling well today and tolerated cycle 1 without significant side effects. His appetite is good and he has gained some weight. He denies any nausea or diarrhea. He did not have to take any anti-emetics and continues to be active. He  denies specific complaints today.   ECOG PS- 0 Pain scale- 0   Review of systems- Review of Systems  Constitutional: Positive for malaise/fatigue. Negative for chills, fever and weight loss.  HENT: Negative for congestion, ear discharge and nosebleeds.   Eyes: Negative for blurred vision.  Respiratory: Negative for cough, hemoptysis, sputum production, shortness of breath and wheezing.   Cardiovascular: Negative for chest pain, palpitations, orthopnea and claudication.  Gastrointestinal: Negative for abdominal pain, blood in stool, constipation, diarrhea, heartburn, melena, nausea and vomiting.  Genitourinary: Negative for dysuria, flank pain, frequency, hematuria and urgency.  Musculoskeletal: Negative for back pain, joint pain and myalgias.  Skin: Negative for rash.  Neurological: Negative for dizziness, tingling, focal weakness, seizures, weakness and headaches.  Endo/Heme/Allergies: Does not bruise/bleed easily.  Psychiatric/Behavioral: Negative for depression and suicidal ideas. The patient does not have insomnia.      No Known Allergies   Past Medical History:  Diagnosis Date  . COPD (chronic obstructive pulmonary disease) ([place])   . Hyperlipidemia     Past Surgical History:  Procedure Laterality Date  . COLONOSCOPY WITH PROPOFOL N/A 06/26/2019   Procedure: COLONOSCOPY WITH PROPOFOL;  Surgeon: [name], MD;  Location: [facility] ENDOSCOPY;  Service: Gastroenterology;  Laterality: N/A;  . PORTA CATH INSERTION N/A 06/25/2019   Procedure: PORTA CATH INSERTION;  Surgeon: Algernon Huxley, MD;  Location: [place] [facility] LAB;  Service: Cardiovascular;  Laterality: N/A;    Social History   Socioeconomic History  . Marital status: Single    Spouse name: Not on file  . Number of children: Not on file  . Years of education: Not on file  . Highest education level: Not on file  Occupational History  . Not on file  Social Needs  . Financial resource strain: Not hard at all  . Food  insecurity    Worry: Never true    Inability: Never true  . Transportation needs    Medical: No    Non-medical: No  Tobacco Use  . Smoking status: Current Every Day Smoker    Packs/day: 0.50  . Smokeless tobacco: Never Used  . Tobacco comment: smoking 40 years  Substance and Sexual Activity  . Alcohol use: No  . Drug use: No  . Sexual activity: Not Currently  Lifestyle  . Physical activity    Days per week: Not on file    Minutes per session: Not on file  . Stress: Not at all  Relationships  . Social connections    Talks on phone: More than three times a week    Gets together: Not on file    Attends religious service: Not on file    Active member of club or organization: Not on file    Attends meetings of clubs or organizations: Not on file    Relationship status: Not on file  . Intimate partner violence    Fear of current or ex partner: No    Emotionally abused: No    Physically abused: No    Forced sexual activity: No  Other Topics Concern  . Not on file  Social History Narrative  . Not on file    Family History  Problem Relation Age of Onset  . Brain cancer Father     Current Outpatient Medications:  .  aspirin EC 81 MG tablet, Take 81 mg by mouth daily., Disp: , Rfl:  .  Budesonide-Formoterol Fumarate (SYMBICORT IN), Inhale into the lungs., Disp: , Rfl:  .  Butalbital-Acetaminophen (BUTALBITAL-APAP PO), Take by mouth., Disp: , Rfl:  .  cetirizine (ZYRTEC) 10 MG tablet, Take 1 tablet (10 mg total) by mouth daily., Disp: 30 tablet, Rfl: 0 .  cyclobenzaprine (FLEXERIL) 5 MG tablet, Take 1 tablet (5 mg total) by mouth 3 (three) times daily as needed for muscle spasms., Disp: 10 tablet, Rfl: 0 .  dexamethasone (DECADRON) 4 MG tablet, Take 2 tablets (8 mg total) by mouth daily. Start the day after chemotherapy for 2 days. Take with food., Disp: 30 tablet, Rfl: 1 .  fluticasone (VERAMYST) 27.5 MCG/SPRAY nasal spray, Place 2 sprays into the nose daily., Disp: , Rfl:  .   lidocaine-prilocaine (EMLA) cream, Apply to affected area once, Disp: 30 g, Rfl: 3 .  montelukast (SINGULAIR) 10 MG tablet, Take 10 mg by mouth at bedtime., Disp: , Rfl:  .  naproxen (NAPROSYN) 375 MG tablet, Take 1 tablet (375 mg total) by mouth 2 (two) times daily with a meal., Disp: 10 tablet, Rfl: 0 .  ondansetron (ZOFRAN) 8 MG tablet, Take 1 tablet (8 mg total) by mouth 2 (two) times daily as needed for refractory nausea / vomiting. Start on day 3 after chemotherapy., Disp: 30 tablet, Rfl: 1 .  pantoprazole (PROTONIX) 40 MG tablet, Take 40 mg by mouth daily., Disp: , Rfl:  .  prochlorperazine (COMPAZINE) 10 MG tablet, Take 1 tablet (10 mg total) by mouth every 6 (six) hours as needed (Nausea or vomiting)., Disp: 30 tablet, Rfl: 1 .  simvastatin (ZOCOR) 20 MG tablet, Take 20 mg by mouth daily., Disp: , Rfl:  .  tamsulosin (FLOMAX) 0.4 MG CAPS capsule, Take 0.4 mg by mouth., Disp: , Rfl:  .  traMADol-acetaminophen (ULTRACET) 37.5-325 MG tablet, Take 1 tablet by mouth every  6 (six) hours., Disp: , Rfl:  No current facility-administered medications for this visit.   Facility-Administered Medications Ordered in Other Visits:  .  fluorouracil (ADRUCIL) 4,300 mg in sodium chloride 0.9 % 64 mL chemo infusion, 2,400 mg/m2 (Treatment Plan Recorded), Intravenous, 1 day or 1 dose, Finnegan, Kathlene November, MD .  fluorouracil (ADRUCIL) chemo injection 700 mg, 400 mg/m2 (Treatment Plan Recorded), Intravenous, Once, Lloyd Huger, MD .  leucovorin 700 mg in dextrose 5 % 250 mL infusion, 700 mg, Intravenous, Once, Sindy Guadeloupe, MD, Last Rate: 143 mL/hr at 07/16/19 1125, 700 mg at 07/16/19 1125 .  oxaliplatin (ELOXATIN) 150 mg in dextrose 5 % 500 mL chemo infusion, 85 mg/m2 (Treatment Plan Recorded), Intravenous, Once, Lloyd Huger, MD, Last Rate: 265 mL/hr at 07/16/19 1126, 150 mg at 07/16/19 1126  Physical exam:  Vitals:   07/16/19 0941  BP: (!) 146/77  Pulse: 67  Resp: 18  Temp: 97.7 F  (36.5 C)  TempSrc: Tympanic  Weight: 147 lb 9.6 oz (67 kg)   Physical Exam Constitutional:      Comments: Thin man in no acute distress  HENT:     Head: Normocephalic and atraumatic.  Eyes:     Pupils: Pupils are equal, round, and reactive to light.  Neck:     Musculoskeletal: Normal range of motion.  Cardiovascular:     Rate and Rhythm: Normal rate and regular rhythm.     Heart sounds: Normal heart sounds.  Pulmonary:     Effort: Pulmonary effort is normal.     Breath sounds: Normal breath sounds.  Abdominal:     General: Bowel sounds are normal.     Palpations: Abdomen is soft.  Skin:    General: Skin is warm and dry.  Neurological:     Mental Status: He is alert and oriented to person, place, and time.      CMP Latest Ref Rng & Units 07/16/2019  Glucose 70 - 99 mg/dL 119(H)  BUN 8 - 23 mg/dL 21  Creatinine 0.61 - 1.24 mg/dL 0.77  Sodium 135 - 145 mmol/L 137  Potassium 3.5 - 5.1 mmol/L 4.0  Chloride 98 - 111 mmol/L 107  CO2 22 - 32 mmol/L 22  Calcium 8.9 - 10.3 mg/dL 8.8(L)  Total Protein 6.5 - 8.1 g/dL 6.5  Total Bilirubin 0.3 - 1.2 mg/dL 0.6  Alkaline Phos 38 - 126 U/L 104  AST 15 - 41 U/L 33  ALT 0 - 44 U/L 38   CBC Latest Ref Rng & Units 07/16/2019  WBC 4.0 - 10.5 K/uL 6.4  Hemoglobin 13.0 - 17.0 g/dL 12.9(L)  Hematocrit 39.0 - 52.0 % 37.8(L)  Platelets 150 - 400 K/uL 163    No images are attached to the encounter.  No results found.   Assessment and plan- Patient is a 68 y.o. male with metastatic colon cancer TX NX M1 with generalized lymphadenopathy, K-ras/beta wild type, who returns to clinic for treatment assessment prior to cycle 2 of FOLFOX Avastin chemotherapy.   He is tolerating treatment well. Blood pressure slightly elevated but normal. Trace urine protein. Labs reviewed and counts acceptable to proceed with cycle 2 of FOLFOX Avastin chemotherapy today. He will return to clinic to have pump removed on day 3. Treatment are given with palliative  intent and plan is to continue chemotherapy until progression or toxicity which he understands. We again discussed that Dr. [name] would plan to repeat scan in 3 months or roughly early October 2020  to evaluate. Continue prn anti-emetics as needed. Encouraged good nutrition and activity. He will call us if he has any questions or concerns but is in agreement with plan today.   He will meet with [facility] today and initiate care with outpatient [facility] today as well.   RTC to clinic for labs (cbc with differential, CMP, urine protein) and evaluation prior to cycle 3 of FOLFOX Avastin chemotherapy with Dr. [name].   Visit Diagnosis 1. 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 ([place])   2. Encounter for antineoplastic chemotherapy     Beckey Rutter, DNP, AGNP-C [place] at [facility] [phone] (work cell) [phone] (office)  CC: Dr. [name]

## 2019-07-16 NOTE — Progress Notes (Signed)
Pt here for follow up and treatment. Offers no complaints. States is feeling well.

## 2019-07-16 NOTE — Progress Notes (Signed)
Appointment rescheduled.

## 2019-07-16 NOTE — Progress Notes (Signed)
[name]  [phone] [phone]   Name: Eric Simon Date: 07/16/2019 MRN: [id number]  DOB: September 25, 1951  Patient Care Team: Jodi Marble, MD as PCP - General (Internal Medicine)    REASON FOR CONSULTATION: Palliative Care consult requested for this 68 y.o. 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  Patient initiated treatment with FOLFOX Avastin chemotherapy.  He was referred to palliative care to discuss goals and manage ongoing symptoms.   SOCIAL HISTORY:     reports that he has been smoking. He has been smoking about 0.50 packs per day. He has never used smokeless tobacco. He reports that he does not drink alcohol or use drugs.   Patient is divorced.  He has no children.  He lives at home and rents a room to a friend.  Patient is originally from [place] but has lived here for 30 years.  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  ADVANCE DIRECTIVES:  Living will but not on file  CODE STATUS:   PAST MEDICAL HISTORY: Past Medical History:  Diagnosis Date  . COPD (chronic obstructive pulmonary disease) ([place])   . Hyperlipidemia     PAST SURGICAL HISTORY:  Past Surgical History:  Procedure Laterality Date  . COLONOSCOPY WITH PROPOFOL N/A 06/26/2019   Procedure: COLONOSCOPY WITH PROPOFOL;  Surgeon: [name], MD;  Location: [facility] ENDOSCOPY;  Service: Gastroenterology;  Laterality: N/A;  . PORTA CATH INSERTION N/A 06/25/2019   Procedure: PORTA CATH INSERTION;  Surgeon: Algernon Huxley, MD;  Location: [place] LAB;  Service: Cardiovascular;  Laterality: N/A;    HEMATOLOGY/ONCOLOGY HISTORY:  Oncology History  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 ([place])  06/19/2019 Cancer Staging   Staging form: Colon and Rectum,  AJCC 8th Edition - Clinical stage from 06/19/2019: Stage Unknown (cTX, cNX, pM1) - Signed by Sindy Guadeloupe, MD on 06/20/2019   06/20/2019 Initial Diagnosis   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 ([place])   07/02/2019 -  Chemotherapy   The patient had palonosetron (ALOXI) injection 0.25 mg, 0.25 mg, Intravenous,  Once, 2 of 4 cycles Administration: 0.25 mg (07/02/2019) bevacizumab (AVASTIN) 350 mg in sodium chloride 0.9 % 100 mL chemo infusion, 5 mg/kg = 350 mg, Intravenous,  Once, 2 of 4 cycles Administration: 350 mg (07/02/2019) leucovorin 700 mg in dextrose 5 % 250 mL infusion, 391 mg/m2 = 716 mg, Intravenous,  Once, 2 of 4 cycles Administration: 700 mg (07/02/2019) oxaliplatin (ELOXATIN) 150 mg in dextrose 5 % 500 mL chemo infusion, 85 mg/m2 = 150 mg, Intravenous,  Once, 2 of 4 cycles Administration: 150 mg (07/02/2019) fluorouracil (ADRUCIL) chemo injection 700 mg, 400 mg/m2 = 700 mg, Intravenous,  Once, 2 of 4 cycles Administration: 700 mg (07/02/2019) fluorouracil (ADRUCIL) 4,300 mg in sodium chloride 0.9 % 64 mL chemo infusion, 2,400 mg/m2 = 4,300 mg, Intravenous, 1 Day/Dose, 2 of 4 cycles Administration: 4,300 mg (07/02/2019)  for chemotherapy treatment.      ALLERGIES:  has No Known Allergies.  MEDICATIONS:  Current Outpatient Medications  Medication Sig Dispense Refill  . aspirin EC 81 MG tablet Take 81 mg by mouth daily.    . Budesonide-Formoterol Fumarate (SYMBICORT IN) Inhale into the lungs.    . Butalbital-Acetaminophen (BUTALBITAL-APAP PO) Take by mouth.    . cetirizine (ZYRTEC) 10 MG tablet Take 1 tablet (  10 mg total) by mouth daily. 30 tablet 0  . cyclobenzaprine (FLEXERIL) 5 MG tablet Take 1 tablet (5 mg total) by mouth 3 (three) times daily as needed for muscle spasms. 10 tablet 0  . dexamethasone (DECADRON) 4 MG tablet Take 2 tablets (8 mg total) by mouth daily. Start the day after chemotherapy for 2 days. Take with food. 30 tablet 1  . fluticasone (VERAMYST)  27.5 MCG/SPRAY nasal spray Place 2 sprays into the nose daily.    [name] lidocaine-prilocaine (EMLA) cream Apply to affected area once 30 g 3  . montelukast (SINGULAIR) 10 MG tablet Take 10 mg by mouth at bedtime.    . naproxen (NAPROSYN) 375 MG tablet Take 1 tablet (375 mg total) by mouth 2 (two) times daily with a meal. 10 tablet 0  . ondansetron (ZOFRAN) 8 MG tablet Take 1 tablet (8 mg total) by mouth 2 (two) times daily as needed for refractory nausea / vomiting. Start on day 3 after chemotherapy. 30 tablet 1  . pantoprazole (PROTONIX) 40 MG tablet Take 40 mg by mouth daily.    . prochlorperazine (COMPAZINE) 10 MG tablet Take 1 tablet (10 mg total) by mouth every 6 (six) hours as needed (Nausea or vomiting). 30 tablet 1  . simvastatin (ZOCOR) 20 MG tablet Take 20 mg by mouth daily.    . tamsulosin (FLOMAX) 0.4 MG CAPS capsule Take 0.4 mg by mouth.    . traMADol-acetaminophen (ULTRACET) 37.5-325 MG tablet Take 1 tablet by mouth every 6 (six) hours.     No current facility-administered medications for this visit.    Facility-Administered Medications Ordered in Other Visits  Medication Dose Route Frequency Provider Last Rate Last Dose  . fluorouracil (ADRUCIL) 4,300 mg in sodium chloride 0.9 % 64 mL chemo infusion  2,400 mg/m2 (Treatment Plan Recorded) Intravenous 1 day or 1 dose Lloyd Huger, MD      . fluorouracil (ADRUCIL) chemo injection 700 mg  400 mg/m2 (Treatment Plan Recorded) Intravenous Once Lloyd Huger, MD      . leucovorin 700 mg in dextrose 5 % 250 mL infusion  700 mg Intravenous Once Sindy Guadeloupe, MD 143 mL/hr at 07/16/19 1125 700 mg at 07/16/19 1125  . oxaliplatin (ELOXATIN) 150 mg in dextrose 5 % 500 mL chemo infusion  85 mg/m2 (Treatment Plan Recorded) Intravenous Once Lloyd Huger, MD 265 mL/hr at 07/16/19 1126 150 mg at 07/16/19 1126    VITAL SIGNS: There were no vitals taken for this visit. There were no vitals filed for this visit.  Estimated body mass  index is 23.12 kg/m as calculated from the following:   Height as of 06/26/19: '5\' 7"'  (1.702 m).   Weight as of an earlier encounter on 07/16/19: 147 lb 9.6 oz (67 kg).  LABS: CBC:    Component Value Date/Time   WBC 6.4 07/16/2019 0913   HGB 12.9 (L) 07/16/2019 0913   HGB 16.1 08/18/2012 1321   HCT 37.8 (L) 07/16/2019 0913   HCT 45.4 08/18/2012 1321   PLT 163 07/16/2019 0913   PLT 192 08/18/2012 1321   MCV 87.1 07/16/2019 0913   MCV 88 08/18/2012 1321   NEUTROABS 3.4 07/16/2019 0913   LYMPHSABS 1.9 07/16/2019 0913   MONOABS 0.6 07/16/2019 0913   EOSABS 0.5 07/16/2019 0913   BASOSABS 0.1 07/16/2019 0913   Comprehensive Metabolic Panel:    Component Value Date/Time   NA 137 07/16/2019 0913   NA 139 08/18/2012 1321   K 4.0  07/16/2019 0913   K 3.7 08/18/2012 1321   CL 107 07/16/2019 0913   CL 104 08/18/2012 1321   CO2 22 07/16/2019 0913   CO2 27 08/18/2012 1321   BUN 21 07/16/2019 0913   BUN 18 08/18/2012 1321   CREATININE 0.77 07/16/2019 0913   CREATININE 0.82 08/18/2012 1321   GLUCOSE 119 (H) 07/16/2019 0913   GLUCOSE 68 08/18/2012 1321   CALCIUM 8.8 (L) 07/16/2019 0913   CALCIUM 9.0 08/18/2012 1321   AST 33 07/16/2019 0913   AST 36 08/18/2012 1321   ALT 38 07/16/2019 0913   ALT 51 08/18/2012 1321   ALKPHOS 104 07/16/2019 0913   ALKPHOS 88 08/18/2012 1321   BILITOT 0.6 07/16/2019 0913   BILITOT 0.4 08/18/2012 1321   PROT 6.5 07/16/2019 0913   PROT 8.0 08/18/2012 1321   ALBUMIN 3.5 07/16/2019 0913   ALBUMIN 4.1 08/18/2012 1321    RADIOGRAPHIC STUDIES: No results found.  PERFORMANCE STATUS (ECOG) : 0 - Asymptomatic  Review of Systems Unless otherwise noted, a complete review of systems is negative.  Physical Exam General: NAD, frail appearing, thin Pulmonary: Unlabored Extremities: no edema Skin: no rashes Neurological: Weakness but otherwise nonfocal  IMPRESSION: I met with patient today while he was receiving treatment in the infusion area.   Introduced palliative care services and attempted to establish therapeutic rapport.  Patient reports doing well.  He denies any distressing symptoms or concerns today.  Patient says he is eating well.  He says he is functionally independent with all of his care and still drives.  Patient says he is coping well.  Patient says that he knows cancer is unpredictable and he hopes that treatment will be effective at prolonging his life but recognizes the treatment is with palliative intent.  Patient says that he has several friends that he can rely on for assistance if needed.  He has completed a living will but it does not appear that we have it on file.  We discussed establishing a healthcare power of attorney but he wants time to think about it.  Also reviewed with him a MOST Form, which he took home to think about.  PLAN: -Continue current scope of treatment -Patient has completed living will but not HC POA -Discussed MOST Form and will complete during future visit -RTC 3 to 4 weeks   Patient expressed understanding and was in agreement with this plan. He also understands that He can call the clinic at any time with any questions, concerns, or complaints.     Time Total: 15 minutes  Visit consisted of counseling and education dealing with the complex and emotionally intense issues of symptom management and palliative care in the setting of serious and potentially life-threatening illness.Greater than 50%  of this time was spent counseling and coordinating care related to the above assessment and plan.  Signed by: Altha Harm, PhD, NP-C [phone] (Work Cell)

## 2019-07-18 ENCOUNTER — Inpatient Hospital Stay (HOSPITAL_BASED_OUTPATIENT_CLINIC_OR_DEPARTMENT_OTHER): Payer: Medicare HMO | Admitting: Oncology

## 2019-07-18 ENCOUNTER — Inpatient Hospital Stay: Payer: Medicare HMO

## 2019-07-18 ENCOUNTER — Other Ambulatory Visit: Payer: Self-pay

## 2019-07-18 DIAGNOSIS — C772 Secondary and unspecified malignant neoplasm of intra-abdominal lymph nodes: Secondary | ICD-10-CM

## 2019-07-18 DIAGNOSIS — J449 Chronic obstructive pulmonary disease, unspecified: Secondary | ICD-10-CM | POA: Diagnosis not present

## 2019-07-18 DIAGNOSIS — C189 Malignant neoplasm of colon, unspecified: Secondary | ICD-10-CM

## 2019-07-18 DIAGNOSIS — Z5112 Encounter for antineoplastic immunotherapy: Secondary | ICD-10-CM | POA: Diagnosis not present

## 2019-07-18 MED ORDER — HEPARIN SOD (PORK) LOCK FLUSH 100 UNIT/ML IV SOLN
500.0000 [IU] | Freq: Once | INTRAVENOUS | Status: AC | PRN
  Administered 2019-07-18: 500 [IU]

## 2019-07-18 MED ORDER — HEPARIN SOD (PORK) LOCK FLUSH 100 UNIT/ML IV SOLN
INTRAVENOUS | Status: AC
  Filled 2019-07-18: qty 5

## 2019-07-18 MED ORDER — SODIUM CHLORIDE 0.9% FLUSH
10.0000 mL | INTRAVENOUS | Status: DC | PRN
  Administered 2019-07-18: 10 mL
  Filled 2019-07-18: qty 10

## 2019-07-18 NOTE — Progress Notes (Signed)
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 ([place])  06/19/2019 Cancer Staging   Staging form: Colon and Rectum, AJCC 8th Edition - Clinical stage from 06/19/2019: Stage Unknown (cTX, cNX, pM1) - Signed by Sindy Guadeloupe, MD on 06/20/2019   06/20/2019 Initial Diagnosis   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 ([place])   07/02/2019 -  Chemotherapy   The patient had palonosetron (ALOXI) injection 0.25 mg, 0.25 mg, Intravenous,  Once, 2 of 4 cycles Administration: 0.25 mg (07/02/2019), 0.25 mg (07/16/2019) bevacizumab (AVASTIN) 350 mg in sodium chloride 0.9 % 100 mL chemo infusion, 5 mg/kg = 350 mg, Intravenous,  Once, 2 of 4 cycles Administration: 350 mg (07/02/2019), 350 mg (07/16/2019) leucovorin 700 mg in dextrose 5 % 250 mL infusion, 391 mg/m2 = 716 mg, Intravenous,  Once, 2 of 4 cycles Administration: 700 mg (07/02/2019) oxaliplatin (ELOXATIN) 150 mg in dextrose 5 % 500 mL chemo infusion, 85 mg/m2 = 150 mg, Intravenous,  Once, 2 of 4 cycles Administration: 150 mg (07/02/2019), 150 mg (07/16/2019) fluorouracil (ADRUCIL) chemo injection 700 mg, 400 mg/m2 = 700 mg, Intravenous,  Once, 2 of 4 cycles Administration: 700 mg (07/02/2019), 700 mg (07/16/2019) fluorouracil (ADRUCIL) 4,300 mg in sodium chloride 0.9 % 64 mL chemo infusion, 2,400 mg/m2 = 4,300 mg, Intravenous, 1 Day/Dose, 2 of 4 cycles Administration: 4,300 mg (07/02/2019), 4,300 mg (07/16/2019)  for chemotherapy  treatment.      Interval history-Mr. Muscarella is a 68 year old male who presents to chemo care clinic today for initial meeting in preparation for starting chemotherapy. I introduced the chemo care clinic and we discussed that the role of the clinic is to assist those who are at an increased risk of emergency room visits and/or complications during the course of chemotherapy treatment. We discussed that the increased risk takes into account factors such as age, performance status, and co-morbidities. We also discussed that for some, this might include barriers to care such as not having a primary care provider, lack of insurance/transportation, or not being able to afford medications. We discussed that the goal of the program is to help prevent unplanned ER visits and help reduce complications during chemotherapy. We do this by discussing specific risk factors to each individual and identifying ways that we can help improve these risk factors and reduce barriers to care.   ECOG FS:1 - Symptomatic but completely ambulatory  Review of systems- Review of Systems  Constitutional: Positive for malaise/fatigue. Negative for chills, fever and weight loss.  HENT: Negative for congestion, ear pain and tinnitus.   Eyes: Negative.  Negative for blurred vision and double vision.  Respiratory: Negative.  Negative for cough, sputum production and shortness of breath.   Cardiovascular: Negative.  Negative for chest pain, palpitations and leg swelling.  Gastrointestinal: Negative.  Negative for abdominal pain, constipation, diarrhea, nausea and vomiting.  Genitourinary: Negative for dysuria, frequency and urgency.  Musculoskeletal: Negative for back pain and falls.  Skin: Negative.  Negative for rash.  Neurological: Negative.  Negative for weakness and headaches.  Endo/Heme/Allergies: Negative.  Does not bruise/bleed easily.  Psychiatric/Behavioral: Negative.  Negative for depression. The patient is not  nervous/anxious and does not have insomnia.      Current treatment- s/p cycle 2 FOLFOX Avastin chemotherapy.  Last given on 07/16/2019  No Known Allergies  Past Medical History:  Diagnosis Date  . COPD (chronic obstructive pulmonary disease) ([place])   . Hyperlipidemia     Past Surgical History:  Procedure Laterality Date  . COLONOSCOPY WITH PROPOFOL N/A 06/26/2019   Procedure: COLONOSCOPY WITH PROPOFOL;  Surgeon: [name], MD;  Location[facility] ENDOSCOPY;  Service: Gastroenterology;  Laterality: N/A;  . PORTA CATH INSERTION N/A 06/25/2019   Procedure: PORTA CATH INSERTION;  Surgeon: Algernon Huxley, MD;  Location: [place] CV LAB;  Service: Cardiovascular;  Laterality: N/A;    Social History   Socioeconomic History  . Marital status: Single    Spouse name: Not on file  . Number of children: Not on file  . Years of education: Not on file  . Highest education level: Not on file  Occupational History  . Not on file  Social Needs  . Financial resource strain: Not hard at all  . Food insecurity    Worry: Never true    Inability: Never true  . Transportation needs    Medical: No    Non-medical: No  Tobacco Use  . Smoking status: Current Every Day Smoker    Packs/day: 0.50  . Smokeless tobacco: Never Used  . Tobacco comment: smoking 40 years  Substance and Sexual Activity  . Alcohol use: No  . Drug use: No  . Sexual activity: Not Currently  Lifestyle  . Physical activity    Days per week: Not on file    Minutes per session: Not on file  . Stress: Not at all  Relationships  . Social connections    Talks on phone: More than three times a week    Gets together: Not on file    Attends religious service: Not on file    Active member of club or organization: Not on file    Attends meetings of clubs or organizations: Not on file    Relationship status: Not on file  . Intimate partner violence    Fear of current or ex partner: No    Emotionally abused: No    Physically  abused: No    Forced sexual activity: No  Other Topics Concern  . Not on file  Social History Narrative  . Not on file    Family History  Problem Relation Age of Onset  . Brain cancer Father      Current Outpatient Medications:  .  aspirin EC 81 MG tablet, Take 81 mg by mouth daily., Disp: , Rfl:  .  Budesonide-Formoterol Fumarate (SYMBICORT IN), Inhale into the lungs., Disp: , Rfl:  .  Butalbital-Acetaminophen (BUTALBITAL-APAP PO), Take by mouth., Disp: , Rfl:  .  cetirizine (ZYRTEC) 10 MG tablet, Take 1 tablet (10 mg total) by mouth daily., Disp: 30 tablet, Rfl: 0 .  cyclobenzaprine (FLEXERIL) 5 MG tablet, Take 1 tablet (5 mg total) by mouth 3 (three) times daily as needed for muscle spasms., Disp: 10 tablet, Rfl: 0 .  dexamethasone (DECADRON) 4 MG tablet, Take 2 tablets (8 mg total) by mouth daily. Start the day after chemotherapy for 2 days. Take with food., Disp: 30 tablet, Rfl: 1 .  fluticasone (VERAMYST) 27.5 MCG/SPRAY nasal spray, Place 2 sprays into the nose daily., Disp: , Rfl:  .  lidocaine-prilocaine (EMLA) cream, Apply to affected area once, Disp: 30 g, Rfl: 3 .  montelukast (SINGULAIR) 10 MG tablet, Take 10 mg by mouth at bedtime., Disp: , Rfl:  .  naproxen (NAPROSYN) 375 MG tablet, Take 1 tablet (375 mg total) by mouth 2 (two) times daily with a meal., Disp: 10 tablet, Rfl: 0 .  ondansetron (ZOFRAN) 8 MG tablet, Take 1 tablet (8 mg total) by mouth 2 (two) times daily as needed for refractory nausea / vomiting. Start on day 3 after chemotherapy., Disp: 30 tablet, Rfl: 1 .  pantoprazole (PROTONIX) 40 MG tablet, Take 40 mg by mouth daily., Disp: , Rfl:  .  prochlorperazine (COMPAZINE) 10 MG tablet, Take 1 tablet (10 mg total) by mouth every 6 (six) hours as needed (Nausea or vomiting)., Disp: 30 tablet, Rfl: 1 .  simvastatin (ZOCOR) 20 MG tablet, Take 20 mg by mouth daily., Disp: , Rfl:  .  tamsulosin (FLOMAX) 0.4 MG CAPS capsule, Take 0.4 mg by mouth., Disp: , Rfl:  .   traMADol-acetaminophen (ULTRACET) 37.5-325 MG tablet, Take 1 tablet by mouth every 6 (six) hours., Disp: , Rfl:   Current Facility-Administered Medications:  .  sodium chloride flush (NS) 0.9 % injection 10 mL, 10 mL, Intracatheter, PRN, Lloyd Huger, MD, 10 mL at 07/18/19 1326  Physical exam: There were no vitals filed for this visit. Physical Exam Constitutional:      Appearance: Normal appearance.  HENT:     Head: Normocephalic and atraumatic.  Eyes:     Pupils: Pupils are equal, round, and reactive to light.  Neck:     Musculoskeletal: Normal range of motion.  Cardiovascular:     Rate and Rhythm: Normal rate and regular rhythm.     Heart sounds: Normal heart sounds. No murmur.  Pulmonary:     Effort: Pulmonary effort is normal.     Breath sounds: Normal breath sounds. No wheezing.  Abdominal:     General: Bowel sounds are normal. There is no distension.     Palpations: Abdomen is soft.     Tenderness: There is no abdominal tenderness.  Musculoskeletal: Normal range of motion.  Skin:    General: Skin is warm and dry.     Findings: No rash.  Neurological:     Mental Status: He is alert and oriented to person, place, and time.  Psychiatric:        Judgment: Judgment normal.      CMP Latest Ref Rng & Units 07/16/2019  Glucose 70 - 99 mg/dL 119(H)  BUN 8 - 23 mg/dL 21  Creatinine 0.61 - 1.24 mg/dL 0.77  Sodium 135 - 145 mmol/L 137  Potassium 3.5 - 5.1 mmol/L 4.0  Chloride 98 - 111 mmol/L 107  CO2 22 - 32 mmol/L 22  Calcium 8.9 - 10.3 mg/dL 8.8(L)  Total Protein 6.5 - 8.1 g/dL 6.5  Total Bilirubin 0.3 - 1.2 mg/dL 0.6  Alkaline Phos 38 - 126 U/L 104  AST 15 - 41 U/L 33  ALT 0 - 44 U/L 38   CBC Latest Ref Rng & Units 07/16/2019  WBC 4.0 - 10.5 K/uL 6.4  Hemoglobin 13.0 - 17.0 g/dL 12.9(L)  Hematocrit 39.0 - 52.0 % 37.8(L)  Platelets 150 - 400 K/uL 163    No images are attached to the encounter.  No results found.   Assessment and plan- Patient is a 68  y.o. male who presents to [facility] for initial meeting in preparation for starting chemotherapy  for the treatment of metastatic colon cancer. S/p cycle 2 FOLFOX Avastin.  Scheduled for pump removal today.   1. Cancer-metastatic colon cancer.  He initially presented to the emergency room with symptoms of left-sided chest and arm pain.  Troponin was negative EKG was unremarkable.  CT chest showed no PE.  Found to have mediastinal and retrocrural adenopathy.  PET scan showed retroperitoneal pelvic and thoracic adenopathy favoring lymphoma.  CT-guided biopsy revealed metastatic adenocarcinoma compatible with colorectal origin.  Underwent colonoscopy which showed sigmoid mass was consistent with adenocarcinoma.  2. Chemo Care Clinic/High Risk for ER/Hospitalization during chemotherapy- We discussed the role of the chemo care clinic and identified patient specific risk factors. I discussed that patient was identified as high risk primarily based on: Comorbidities and stage of cancer.  Patient lives at home with a roommate.  He drives his own car.  He is not married.  He does not have any children.  He has siblings that do not live close by.  He has friends that visit often and bring him food.  He has been evaluated in the emergency room on several occasions for myriad of complaints.  He has a PCP on file; Dr. Elijio Miles.  After reviewing medical records, it appears he was seen by Dr. Deloria Lair at [facility] for chest discomfort where he was sent to the emergency room. He has [employer].  His past medical history consist of asthma, COPD and hyperlipidemia.   3. Social Determinants of Health- we discussed that social determinants of health may have significant impacts on health and outcomes for cancer patients.  Today we discussed specific social determinants of performance status, alcohol use, depression, financial needs, food insecurity, housing, interpersonal violence, social connections,  stress, tobacco use, and transportation.  After lengthy discussion the following were identified as areas of need: Possible financial insecurity and food insecurity.  Patient does not work and is on a fixed income.  At this time he does not require any DME.  He feels fatigued but is still able to perform all ADLs.  He denies any symptoms of depression.  He cooks at home with his roommate.  Denies any interpersonal violence or alcohol use at home.  He may need future need help obtaining fresh fruits and vegetables.  I will get him in contact with [facility].  He denied concerns with health and affording rent/mortgage.  He does have a roommate who helps him.  He denies lack of social connections because he has many friends that call and check on him and stop by often.  He denies transportation issues and is able to drive himself to and from clinic.  He denied any transportation needs at this time.   4. Co-morbidities Complicating Care: Has history of COPD, hyperlipidemia and asthma.  He denies any exacerbations.  He is managed with current medications.  He is not need any additional medications at this time.  I do not see a pulmonologist on file.  5. Palliative Care- based on stage of cancer and/or identified needs today, I will refer patient to palliative care for goals of care and advanced care planning.  We also discussed the role of the Symptom Management Clinic at [facility] for acute issues and methods of contacting clinic/provider. He denies needing specific assistance at this time and He will be followed by , RN (Nurse Navigator).   Disposition: RTC as scheduled on 07/30/2019 for labs and assessment prior to cycle 3. Referral placed for pulmonology.  Visit Diagnosis  1. 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 He also understands that He can call clinic at any time with any questions, concerns, or complaints.    A total of (25) minutes of face-to-face time was spent with this patient with greater than 50% of that time in counseling and care-coordination.  Rulon Abide, NP, AGNP-C [place] [facility] [phone] (work cell) [phone] (office)  CC: Dr. [name]

## 2019-07-23 ENCOUNTER — Telehealth: Payer: Self-pay | Admitting: *Deleted

## 2019-07-23 ENCOUNTER — Other Ambulatory Visit: Payer: Self-pay

## 2019-07-23 ENCOUNTER — Inpatient Hospital Stay (HOSPITAL_BASED_OUTPATIENT_CLINIC_OR_DEPARTMENT_OTHER): Payer: Medicare HMO | Admitting: Nurse Practitioner

## 2019-07-23 ENCOUNTER — Encounter: Payer: Self-pay | Admitting: Nurse Practitioner

## 2019-07-23 VITALS — BP 133/78 | HR 88 | Temp 98.9°F | Resp 18 | Wt 144.0 lb

## 2019-07-23 DIAGNOSIS — C772 Secondary and unspecified malignant neoplasm of intra-abdominal lymph nodes: Secondary | ICD-10-CM

## 2019-07-23 DIAGNOSIS — C189 Malignant neoplasm of colon, unspecified: Secondary | ICD-10-CM

## 2019-07-23 DIAGNOSIS — K1231 Oral mucositis (ulcerative) due to antineoplastic therapy: Secondary | ICD-10-CM

## 2019-07-23 MED ORDER — DEXAMETHASONE 4 MG PO TABS: 8.0000 mg | ORAL_TABLET | Freq: Every day | ORAL | 1 refills | Status: DC

## 2019-07-23 MED ORDER — MAGIC MOUTHWASH W/LIDOCAINE: 5.0000 mL | Freq: Four times a day (QID) | ORAL | 3 refills | Status: DC | PRN

## 2019-07-23 NOTE — Telephone Encounter (Signed)
I will forward to Dr. [name]. We could see him in Symptom Management Clinic if she'd like or if she wants to see him.

## 2019-07-23 NOTE — Telephone Encounter (Signed)
Patient called back and wanted to be seen today and accepted appointment for 245

## 2019-07-23 NOTE — Progress Notes (Signed)
Symptom Management [place]  Telephone:(336) [phone] Fax:([phone]  Patient Care Team: Jodi Marble, MD as PCP - General (Internal Medicine) Clent Jacks, RN as Oncology Nurse Navigator   Name of the patient: Eric Simon  [id number]  [birth year]   Date of visit: 07/23/19  Diagnosis-metastatic colon cancer with lymph node metastases  Chief complaint/ Reason for visit-mouth sores and painful swallowing  Heme/Onc history:  Oncology History  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 ([place])  06/19/2019 Cancer Staging   Staging form: Colon and Rectum, AJCC 8th Edition - Clinical stage from 06/19/2019: Stage Unknown (cTX, cNX, pM1) - Signed by Sindy Guadeloupe, MD on 06/20/2019   06/20/2019 Initial Diagnosis   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 ([place])   07/02/2019 -  Chemotherapy   The patient had palonosetron (ALOXI) injection 0.25 mg, 0.25 mg, Intravenous,  Once, 2 of 4 cycles Administration: 0.25 mg (07/02/2019), 0.25 mg (07/16/2019) bevacizumab (AVASTIN) 350 mg in sodium chloride 0.9 % 100 mL chemo infusion, 5 mg/kg = 350 mg, Intravenous,  Once, 2 of 4 cycles Administration: 350 mg (07/02/2019), 350 mg (07/16/2019) leucovorin 700 mg in dextrose 5 % 250 mL infusion, 391 mg/m2 = 716 mg, Intravenous,  Once, 2 of 4 cycles Administration: 700 mg (07/02/2019) oxaliplatin (ELOXATIN) 150 mg in dextrose 5 % 500 mL chemo infusion, 85 mg/m2 = 150 mg, Intravenous,  Once, 2 of 4 cycles Administration: 150 mg (07/02/2019), 150 mg (07/16/2019) fluorouracil (ADRUCIL) chemo injection 700 mg, 400 mg/m2 = 700 mg, Intravenous,  Once, 2 of 4 cycles Administration: 700 mg (07/02/2019), 700 mg (07/16/2019) fluorouracil (ADRUCIL) 4,300 mg in sodium chloride 0.9 % 64 mL chemo infusion, 2,400 mg/m2 = 4,300 mg, Intravenous, 1 Day/Dose, 2 of 4 cycles Administration: 4,300 mg (07/02/2019), 4,300 mg (07/16/2019)  for chemotherapy treatment.       Interval history-Eric Simon, 68 year old male with above history of metastatic colon cancer, currently s/p cycle 2 of FOLFOX Avastin chemotherapy, presents to symptom management clinic for mouth and throat pain.  Symptoms started after his last chemotherapy and have persisted since that time.  He localizes to right upper and lower jaw.  Feels his face is swollen.  Describes as painful.  Nothing seems to make better or worse.  Eating and drinking okay.  He wears upper dentures and has not had to stop wearing due to pain.  He has not sought treatment for the symptoms previously.  He also says he has not been taking Decadron after his treatments and says he did not receive this medication.  ECOG FS:1 - Symptomatic but completely ambulatory  Review of systems- Review of Systems  Constitutional: Negative for chills, fever, malaise/fatigue and weight loss.  HENT: Positive for sore throat. Negative for congestion, ear pain, hearing loss, nosebleeds, sinus pain and tinnitus.   Respiratory: Negative for cough, shortness of breath and wheezing.   Cardiovascular: Negative for chest pain, palpitations and leg swelling.  Gastrointestinal: Negative for abdominal pain, diarrhea and nausea.  Genitourinary: Negative for dysuria and urgency.  Musculoskeletal: Negative for back pain, falls, joint pain and myalgias.  Skin: Negative for itching and rash.  Neurological: Negative for speech change and weakness.  Endo/Heme/Allergies: Negative for environmental allergies. Does not bruise/bleed easily.  Psychiatric/Behavioral: Negative for depression. The patient is not nervous/anxious.      Current treatment-FOLFOX Avastin  No Known Allergies  Past Medical History:  Diagnosis Date  . COPD (chronic obstructive pulmonary disease) ([place])   .  Hyperlipidemia     Past Surgical History:  Procedure Laterality Date  . COLONOSCOPY WITH PROPOFOL N/A 06/26/2019   Procedure: COLONOSCOPY WITH PROPOFOL;  Surgeon: [name], MD;  Location: [facility] ENDOSCOPY;  Service: Gastroenterology;  Laterality: N/A;  . PORTA CATH INSERTION N/A 06/25/2019   Procedure: PORTA CATH INSERTION;  Surgeon: Algernon Huxley, MD;  Location: [place] CV LAB;  Service: Cardiovascular;  Laterality: N/A;    Social History   Socioeconomic History  . Marital status: Single    Spouse name: Not on file  . Number of children: Not on file  . Years of education: Not on file  . Highest education level: Not on file  Occupational History  . Not on file  Social Needs  . Financial resource strain: Not hard at all  . Food insecurity    Worry: Never true    Inability: Never true  . Transportation needs    Medical: No    Non-medical: No  Tobacco Use  . Smoking status: Current Every Day Smoker    Packs/day: 0.25    Types: Cigarettes  . Smokeless tobacco: Never Used  . Tobacco comment: smoking 40 years  Substance and Sexual Activity  . Alcohol use: No  . Drug use: No  . Sexual activity: Not Currently  Lifestyle  . Physical activity    Days per week: Not on file    Minutes per session: Not on file  . Stress: Not at all  Relationships  . Social connections    Talks on phone: More than three times a week    Gets together: Not on file    Attends religious service: Not on file    Active member of club or organization: Not on file    Attends meetings of clubs or organizations: Not on file    Relationship status: Not on file  . Intimate partner violence    Fear of current or ex partner: No    Emotionally abused: No    Physically abused: No    Forced sexual activity: No  Other Topics Concern  . Not on file  Social History Narrative  . Not on file    Family History  Problem Relation Age of Onset  . Brain cancer Father      Current Outpatient Medications:  .  aspirin EC 81 MG tablet, Take 81 mg by mouth daily., Disp: , Rfl:  .  Budesonide-Formoterol Fumarate (SYMBICORT IN), Inhale into the lungs., Disp: , Rfl:  .   cetirizine (ZYRTEC) 10 MG tablet, Take 1 tablet (10 mg total) by mouth daily., Disp: 30 tablet, Rfl: 0 .  fluticasone (VERAMYST) 27.5 MCG/SPRAY nasal spray, Place 2 sprays into the nose daily., Disp: , Rfl:  .  lidocaine-prilocaine (EMLA) cream, Apply to affected area once, Disp: 30 g, Rfl: 3 .  ondansetron (ZOFRAN) 8 MG tablet, Take 1 tablet (8 mg total) by mouth 2 (two) times daily as needed for refractory nausea / vomiting. Start on day 3 after chemotherapy., Disp: 30 tablet, Rfl: 1 .  simvastatin (ZOCOR) 20 MG tablet, Take 20 mg by mouth daily., Disp: , Rfl:  .  Butalbital-Acetaminophen (BUTALBITAL-APAP PO), Take by mouth., Disp: , Rfl:  .  cyclobenzaprine (FLEXERIL) 5 MG tablet, Take 1 tablet (5 mg total) by mouth 3 (three) times daily as needed for muscle spasms. (Patient not taking: Reported on 07/23/2019), Disp: 10 tablet, Rfl: 0 .  dexamethasone (DECADRON) 4 MG tablet, Take 2 tablets (8 mg total) by mouth  daily. Start the day after chemotherapy for 2 days. Take with food. (Patient not taking: Reported on 07/23/2019), Disp: 30 tablet, Rfl: 1 .  magic mouthwash w/lidocaine SOLN, Take 5-10 mLs by mouth 4 (four) times daily as needed for mouth pain., Disp: 480 mL, Rfl: 3 .  montelukast (SINGULAIR) 10 MG tablet, Take 10 mg by mouth at bedtime., Disp: , Rfl:  .  naproxen (NAPROSYN) 375 MG tablet, Take 1 tablet (375 mg total) by mouth 2 (two) times daily with a meal. (Patient not taking: Reported on 07/23/2019), Disp: 10 tablet, Rfl: 0 .  pantoprazole (PROTONIX) 40 MG tablet, Take 40 mg by mouth daily., Disp: , Rfl:  .  prochlorperazine (COMPAZINE) 10 MG tablet, Take 1 tablet (10 mg total) by mouth every 6 (six) hours as needed (Nausea or vomiting). (Patient not taking: Reported on 07/23/2019), Disp: 30 tablet, Rfl: 1 .  tamsulosin (FLOMAX) 0.4 MG CAPS capsule, Take 0.4 mg by mouth., Disp: , Rfl:  .  traMADol-acetaminophen (ULTRACET) 37.5-325 MG tablet, Take 1 tablet by mouth every 6 (six) hours., Disp:  , Rfl:   Physical exam:  Vitals:   07/23/19 1502 07/23/19 1505  BP: 133/78 133/78  Pulse: 88   Resp: 18   Temp: 98.9 F (37.2 C)   TempSrc: Tympanic   Weight: 144 lb (65.3 kg)    Physical Exam Constitutional:      General: He is not in acute distress.    Comments: Thin built, unaccompanied  HENT:     Head: Normocephalic and atraumatic.     Right Ear: No drainage, swelling or tenderness.     Left Ear: No drainage, swelling or tenderness.     Nose: No congestion or rhinorrhea.     Mouth/Throat:     Mouth: Mucous membranes are moist. Oral lesions present.     Pharynx: Posterior oropharyngeal erythema present. No oropharyngeal exudate.     Tonsils: No tonsillar exudate.     Comments: Edentulous; upper dentures in place- removed for exam Eyes:     Conjunctiva/sclera: Conjunctivae normal.  Neck:     Musculoskeletal: Neck supple.  Cardiovascular:     Rate and Rhythm: Normal rate and regular rhythm.  Pulmonary:     Effort: Pulmonary effort is normal.     Breath sounds: Normal breath sounds.  Skin:    General: Skin is warm and dry.  Neurological:     Mental Status: He is alert and oriented to person, place, and time.  Psychiatric:        Mood and Affect: Mood normal.        Behavior: Behavior normal.      CMP Latest Ref Rng & Units 07/16/2019  Glucose 70 - 99 mg/dL 119(H)  BUN 8 - 23 mg/dL 21  Creatinine 0.61 - 1.24 mg/dL 0.77  Sodium 135 - 145 mmol/L 137  Potassium 3.5 - 5.1 mmol/L 4.0  Chloride 98 - 111 mmol/L 107  CO2 22 - 32 mmol/L 22  Calcium 8.9 - 10.3 mg/dL 8.8(L)  Total Protein 6.5 - 8.1 g/dL 6.5  Total Bilirubin 0.3 - 1.2 mg/dL 0.6  Alkaline Phos 38 - 126 U/L 104  AST 15 - 41 U/L 33  ALT 0 - 44 U/L 38   CBC Latest Ref Rng & Units 07/16/2019  WBC 4.0 - 10.5 K/uL 6.4  Hemoglobin 13.0 - 17.0 g/dL 12.9(L)  Hematocrit 39.0 - 52.0 % 37.8(L)  Platelets 150 - 400 K/uL 163    No images are attached to  the encounter.  No results found.  Assessment and plan-  Patient is a 68 y.o. male diagnosed with metastatic colon cancer currently s/p cycle 2 of FOLFOX Avastin chemotherapy who presents to symptom management clinic for mucositis.  1. Chemotherapy induced mucositis-grade 1-2 with secondary oral candidiasis.  Encouraged good prophylactic oral care including removal of dentures, and frequent cleanses. Start magic mouth wash 5-69ml swish and swallow four times a day as needed. If symptoms refractory consider fluconazole. Encourage oral rinses of salt and baking soda every 4 hours, frequent brushing with soft to pressure from swab.   2.  Metastatic colon cancer-s/p cycle 2 FOLFOX Avastin chemotherapy. He did not receive decadron prescription previously. Will re-send today.   Patient advised to notify the clinic if there is no improvement in symptoms or if symptoms worsen in next 3-4 days.  Otherwise, follow-up with Dr. [name] as scheduled.   Visit Diagnosis 1. Mucositis due to chemotherapy   2. 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 [facility])     Patient expressed understanding and was in agreement with this plan. He also understands that He can call clinic at any time with any questions, concerns, or complaints.   Thank you for allowing me to participate in the care of this very pleasant patient.   Beckey Rutter, DNP, AGNP-C [place] at [facility] [phone] (work cell) [phone] (office)  CC: Dr. [name]

## 2019-07-23 NOTE — Telephone Encounter (Signed)
Left message on voice mail regarding scheduling an appointment to be seen today by NP or tomorrow by Dr [name]. I will await his return call

## 2019-07-23 NOTE — Telephone Encounter (Signed)
He can be seen in [facility] today or I can see him tomorrow

## 2019-07-23 NOTE — Telephone Encounter (Signed)
Patient called reporting that since his chemotherapy treatment his mouth is sore and his jaw hurts. Please advise

## 2019-07-26 ENCOUNTER — Telehealth: Payer: Self-pay | Admitting: *Deleted

## 2019-07-26 NOTE — Telephone Encounter (Signed)
Patient called reporting that his mouth is still sore and painful. Themouthwash is not helping to eat.

## 2019-07-27 MED ORDER — NYSTATIN 100000 UNIT/ML MT SUSP: 5.0000 mL | Freq: Three times a day (TID) | OROMUCOSAL | 0 refills | Status: DC

## 2019-07-27 NOTE — Telephone Encounter (Signed)
Left message on patient voice mail that prescription was sent to [facility] and to swish and spit medicine three times a day and she will follow up with him at his appointment Monday

## 2019-07-27 NOTE — Telephone Encounter (Signed)
Do you think it is thrush/ do you want to start with nystatin? I will see him on Monday and see if nystatin is helping or not

## 2019-07-27 NOTE — Telephone Encounter (Signed)
Ok go ahead and start nystatin swish and spit TID and I will follow up on Monday.

## 2019-07-30 ENCOUNTER — Inpatient Hospital Stay: Payer: Medicare HMO

## 2019-07-30 ENCOUNTER — Other Ambulatory Visit: Payer: Self-pay

## 2019-07-30 ENCOUNTER — Inpatient Hospital Stay (HOSPITAL_BASED_OUTPATIENT_CLINIC_OR_DEPARTMENT_OTHER): Payer: Medicare HMO | Admitting: Oncology

## 2019-07-30 ENCOUNTER — Encounter: Payer: Self-pay | Admitting: Oncology

## 2019-07-30 VITALS — BP 134/75 | HR 80 | Temp 98.7°F | Resp 16 | Ht 67.0 in | Wt 144.3 lb

## 2019-07-30 DIAGNOSIS — C772 Secondary and unspecified malignant neoplasm of intra-abdominal lymph nodes: Secondary | ICD-10-CM

## 2019-07-30 DIAGNOSIS — E876 Hypokalemia: Secondary | ICD-10-CM

## 2019-07-30 DIAGNOSIS — Z5112 Encounter for antineoplastic immunotherapy: Secondary | ICD-10-CM | POA: Diagnosis not present

## 2019-07-30 DIAGNOSIS — C189 Malignant neoplasm of colon, unspecified: Secondary | ICD-10-CM

## 2019-07-30 DIAGNOSIS — Z5111 Encounter for antineoplastic chemotherapy: Secondary | ICD-10-CM | POA: Diagnosis not present

## 2019-07-30 LAB — COMPREHENSIVE METABOLIC PANEL
ALT: 42 U/L (ref 0–44)
AST: 32 U/L (ref 15–41)
Albumin: 3.4 g/dL — ABNORMAL LOW (ref 3.5–5.0)
Alkaline Phosphatase: 115 U/L (ref 38–126)
Anion gap: 9 (ref 5–15)
BUN: 22 mg/dL (ref 8–23)
CO2: 24 mmol/L (ref 22–32)
Calcium: 8.9 mg/dL (ref 8.9–10.3)
Chloride: 106 mmol/L (ref 98–111)
Creatinine, Ser: 0.72 mg/dL (ref 0.61–1.24)
GFR calc Af Amer: >60 mL/min (ref >60)
GFR calc non Af Amer: >60 mL/min (ref >60)
Glucose, Bld: 129 mg/dL — ABNORMAL HIGH (ref 70–99)
Potassium: 3.4 mmol/L — ABNORMAL LOW (ref 3.5–5.1)
Sodium: 139 mmol/L (ref 135–145)
Total Bilirubin: 0.6 mg/dL (ref 0.3–1.2)
Total Protein: 6.6 g/dL (ref 6.5–8.1)

## 2019-07-30 LAB — CBC WITH DIFFERENTIAL/PLATELET
Abs Immature Granulocytes: 0.04 10*3/uL (ref 0.00–0.07)
Basophils Absolute: 0.1 10*3/uL (ref 0.0–0.1)
Basophils Relative: 1 %
Eosinophils Absolute: 0.1 10*3/uL (ref 0.0–0.5)
Eosinophils Relative: 2 %
HCT: 40.1 % (ref 39.0–52.0)
Hemoglobin: 13.7 g/dL (ref 13.0–17.0)
Immature Granulocytes: 1 %
Lymphocytes Relative: 31 %
Lymphs Abs: 2.1 10*3/uL (ref 0.7–4.0)
MCH: 29.8 pg (ref 26.0–34.0)
MCHC: 34.2 g/dL (ref 30.0–36.0)
MCV: 87.2 fL (ref 80.0–100.0)
Monocytes Absolute: 0.8 10*3/uL (ref 0.1–1.0)
Monocytes Relative: 11 %
Neutro Abs: 3.7 10*3/uL (ref 1.7–7.7)
Neutrophils Relative %: 54 %
Platelets: 162 10*3/uL (ref 150–400)
RBC: 4.6 MIL/uL (ref 4.22–5.81)
RDW: 15.6 % — ABNORMAL HIGH (ref 11.5–15.5)
WBC: 6.7 10*3/uL (ref 4.0–10.5)
nRBC: 0 % (ref 0.0–0.2)

## 2019-07-30 LAB — PROTEIN, URINE, RANDOM: Total Protein, Urine: 17 mg/dL

## 2019-07-30 MED ORDER — POTASSIUM CHLORIDE CRYS ER 20 MEQ PO TBCR: 20.0000 meq | EXTENDED_RELEASE_TABLET | Freq: Every day | ORAL | 0 refills | Status: DC

## 2019-07-30 MED ORDER — SODIUM CHLORIDE 0.9 % IV SOLN
5.0000 mg/kg | Freq: Once | INTRAVENOUS | Status: AC
  Administered 2019-07-30: 350 mg via INTRAVENOUS
  Filled 2019-07-30: qty 14

## 2019-07-30 MED ORDER — DEXAMETHASONE SODIUM PHOSPHATE 10 MG/ML IJ SOLN
10.0000 mg | Freq: Once | INTRAMUSCULAR | Status: AC
  Administered 2019-07-30: 10 mg via INTRAVENOUS
  Filled 2019-07-30: qty 1

## 2019-07-30 MED ORDER — LEUCOVORIN CALCIUM INJECTION 350 MG
700.0000 mg | Freq: Once | INTRAVENOUS | Status: AC
  Administered 2019-07-30: 700 mg via INTRAVENOUS
  Filled 2019-07-30: qty 10

## 2019-07-30 MED ORDER — DEXTROSE 5 % IV SOLN
Freq: Once | INTRAVENOUS | Status: AC
  Administered 2019-07-30: 11:00:00 via INTRAVENOUS
  Filled 2019-07-30: qty 250

## 2019-07-30 MED ORDER — OXALIPLATIN CHEMO INJECTION 100 MG/20ML
65.0000 mg/m2 | Freq: Once | INTRAVENOUS | Status: AC
  Administered 2019-07-30: 115 mg via INTRAVENOUS
  Filled 2019-07-30: qty 20

## 2019-07-30 MED ORDER — PALONOSETRON HCL INJECTION 0.25 MG/5ML
0.2500 mg | Freq: Once | INTRAVENOUS | Status: AC
  Administered 2019-07-30: 0.25 mg via INTRAVENOUS
  Filled 2019-07-30: qty 5

## 2019-07-30 MED ORDER — FLUOROURACIL CHEMO INJECTION 2.5 GM/50ML
400.0000 mg/m2 | Freq: Once | INTRAVENOUS | Status: AC
  Administered 2019-07-30: 700 mg via INTRAVENOUS
  Filled 2019-07-30: qty 14

## 2019-07-30 MED ORDER — SODIUM CHLORIDE 0.9 % IV SOLN
2400.0000 mg/m2 | INTRAVENOUS | Status: DC
  Administered 2019-07-30: 4300 mg via INTRAVENOUS
  Filled 2019-07-30: qty 86

## 2019-07-30 NOTE — Progress Notes (Signed)
Hematology/Oncology Consult note [facility]  Telephone:([phone] Fax:([phone]  Patient Care Team: Jodi Marble, MD as PCP - General (Internal Medicine) Clent Jacks, RN as Oncology Nurse Navigator   Name of the patient: Eric Simon  [id number]  07/07/1951   Date of visit: 07/30/19  Diagnosis- metastatic colon cancer with lymph node metastases K-ras/BRAF wild-type   Chief complaint/ Reason for visit-on treatment assessment prior to cycle 3 of FOLFOX Avastin chemotherapy  Heme/Onc history: Patient is a 68 yr old male with >40 pack year history of smoking. He currently smokes 0.5ppd. he presented to the ER with symptoms of left sided chest pain and left arm pain. Troponin was negative ekg was unremarkable. Ct chest showed no PE. He was incidentally noted to have mediastinal and retrocrural adenopathy and a 2.1X2.3X4.4 cm retroaortic soft tissue lesion in the posterior left chest. 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 Low-grade activity in the left lateral fifth and sixth ribs associated with nondisplaced fractures likely reflecting healing response problem malignancy.  Patient underwent CT-guided biopsy of the retroperitoneal lymph node pathology showed metastatic adenocarcinoma compatible with colorectal origin. CK7 negative. CK20 positive. CDX 2+. TTF-1 negative. PSA negative. This pattern of immunoreactivity supports the above diagnosis. Patient underwent colonoscopy which showed sigmoid mass that was consistent with adenocarcinoma.  Rast panel testing showed that he was wild-type for both K-ras and BRAF   Interval history-patient was seen in symptom management clinic for symptoms of mild pain and inability to open his mouth completely after chemotherapy.  He was initially given Magic mouthwash and later  nystatin was added.  Today patient states that he is able to open his mouth much better.  He continues to have some discomfort over his maxilla.  He is able to eat and denies any mouth sores  ECOG PS- 0 Pain scale- 0 Opioid associated constipation- no  Review of systems- Review of Systems  Constitutional: Negative for chills, fever, malaise/fatigue and weight loss.  HENT: Negative for congestion, ear discharge and nosebleeds.        Pain over right posterior maxilla  Eyes: Negative for blurred vision.  Respiratory: Negative for cough, hemoptysis, sputum production, shortness of breath and wheezing.   Cardiovascular: Negative for chest pain, palpitations, orthopnea and claudication.  Gastrointestinal: Negative for abdominal pain, blood in stool, constipation, diarrhea, heartburn, melena, nausea and vomiting.  Genitourinary: Negative for dysuria, flank pain, frequency, hematuria and urgency.  Musculoskeletal: Negative for back pain, joint pain and myalgias.  Skin: Negative for rash.  Neurological: Negative for dizziness, tingling, focal weakness, seizures, weakness and headaches.  Endo/Heme/Allergies: Does not bruise/bleed easily.  Psychiatric/Behavioral: Negative for depression and suicidal ideas. The patient does not have insomnia.        No Known Allergies   Past Medical History:  Diagnosis Date  . Colon cancer ([place])   . COPD (chronic obstructive pulmonary disease) ([place])   . Hyperlipidemia      Past Surgical History:  Procedure Laterality Date  . COLONOSCOPY WITH PROPOFOL N/A 06/26/2019   Procedure: COLONOSCOPY WITH PROPOFOL;  Surgeon: [name], MD;  Location: [facility] ENDOSCOPY;  Service: Gastroenterology;  Laterality: N/A;  . PORTA CATH INSERTION N/A 06/25/2019   Procedure: PORTA CATH INSERTION;  Surgeon: Algernon Huxley, MD;  Location: [place] CV LAB;  Service: Cardiovascular;  Laterality: N/A;    Social History   Socioeconomic History  .  Marital status: Single     Spouse name: Not on file  . Number of children: Not on file  . Years of education: Not on file  . Highest education level: Not on file  Occupational History  . Not on file  Social Needs  . Financial resource strain: Not hard at all  . Food insecurity    Worry: Never true    Inability: Never true  . Transportation needs    Medical: No    Non-medical: No  Tobacco Use  . Smoking status: Current Every Day Smoker    Packs/day: 0.25    Types: Cigarettes  . Smokeless tobacco: Never Used  . Tobacco comment: smoking 40 years  Substance and Sexual Activity  . Alcohol use: No  . Drug use: No  . Sexual activity: Not Currently  Lifestyle  . Physical activity    Days per week: Not on file    Minutes per session: Not on file  . Stress: Not at all  Relationships  . Social connections    Talks on phone: More than three times a week    Gets together: Not on file    Attends religious service: Not on file    Active member of club or organization: Not on file    Attends meetings of clubs or organizations: Not on file    Relationship status: Not on file  . Intimate partner violence    Fear of current or ex partner: No    Emotionally abused: No    Physically abused: No    Forced sexual activity: No  Other Topics Concern  . Not on file  Social History Narrative  . Not on file    Family History  Problem Relation Age of Onset  . Brain cancer Father      Current Outpatient Medications:  .  aspirin EC 81 MG tablet, Take 81 mg by mouth daily., Disp: , Rfl:  .  cetirizine (ZYRTEC) 10 MG tablet, Take 1 tablet (10 mg total) by mouth daily. (Patient taking differently: Take 10 mg by mouth daily as needed. ), Disp: 30 tablet, Rfl: 0 .  dexamethasone (DECADRON) 4 MG tablet, Take 2 tablets (8 mg total) by mouth daily. Start the day after chemotherapy for 2 days. Take with food., Disp: 30 tablet, Rfl: 1 .  fluticasone (VERAMYST) 27.5 MCG/SPRAY nasal spray, Place 2 sprays into the nose daily.,  Disp: , Rfl:  .  lidocaine-prilocaine (EMLA) cream, Apply to affected area once, Disp: 30 g, Rfl: 3 .  nystatin (MYCOSTATIN) 100000 UNIT/ML suspension, Take 5 mLs (500,000 Units total) by mouth 3 (three) times daily. Swish and Spit, Disp: 60 mL, Rfl: 0 .  pantoprazole (PROTONIX) 40 MG tablet, Take 40 mg by mouth daily., Disp: , Rfl:  .  simvastatin (ZOCOR) 20 MG tablet, Take 20 mg by mouth daily., Disp: , Rfl:  .  Budesonide-Formoterol Fumarate (SYMBICORT IN), Inhale 1 puff into the lungs daily as needed. , Disp: , Rfl:  .  cyclobenzaprine (FLEXERIL) 5 MG tablet, Take 1 tablet (5 mg total) by mouth 3 (three) times daily as needed for muscle spasms. (Patient not taking: Reported on 07/23/2019), Disp: 10 tablet, Rfl: 0 .  magic mouthwash w/lidocaine SOLN, Take 5-10 mLs by mouth 4 (four) times daily as needed for mouth pain. (Patient not taking: Reported on 07/30/2019), Disp: 480 mL, Rfl: 3 .  montelukast (SINGULAIR) 10 MG tablet, Take 10 mg by mouth at bedtime., Disp: , Rfl:  .  ondansetron (  ZOFRAN) 8 MG tablet, Take 1 tablet (8 mg total) by mouth 2 (two) times daily as needed for refractory nausea / vomiting. Start on day 3 after chemotherapy. (Patient not taking: Reported on 07/30/2019), Disp: 30 tablet, Rfl: 1 .  prochlorperazine (COMPAZINE) 10 MG tablet, Take 1 tablet (10 mg total) by mouth every 6 (six) hours as needed (Nausea or vomiting). (Patient not taking: Reported on 07/23/2019), Disp: 30 tablet, Rfl: 1 .  tamsulosin (FLOMAX) 0.4 MG CAPS capsule, Take 0.4 mg by mouth., Disp: , Rfl:  .  traMADol-acetaminophen (ULTRACET) 37.5-325 MG tablet, Take 1 tablet by mouth every 6 (six) hours., Disp: , Rfl:   Physical exam:  Vitals:   07/30/19 0900  BP: 134/75  Pulse: 80  Resp: 16  Temp: 98.7 F (37.1 C)  TempSrc: Tympanic  Weight: 144 lb 4.8 oz (65.5 kg)  Height: '5\' 7"'  (1.702 m)   Physical Exam HENT:     Head: Normocephalic and atraumatic.     Mouth/Throat:     Mouth: Mucous membranes are  moist.     Pharynx: Oropharynx is clear.     Comments: No dental caries/ abscess in the area of concern Eyes:     Pupils: Pupils are equal, round, and reactive to light.  Neck:     Musculoskeletal: Normal range of motion.  Cardiovascular:     Rate and Rhythm: Normal rate and regular rhythm.     Heart sounds: Normal heart sounds.  Pulmonary:     Effort: Pulmonary effort is normal.     Breath sounds: Normal breath sounds.  Abdominal:     General: Bowel sounds are normal.     Palpations: Abdomen is soft.  Skin:    General: Skin is warm and dry.  Neurological:     Mental Status: He is alert and oriented to person, place, and time.      CMP Latest Ref Rng & Units 07/30/2019  Glucose 70 - 99 mg/dL 129(H)  BUN 8 - 23 mg/dL 22  Creatinine 0.61 - 1.24 mg/dL 0.72  Sodium 135 - 145 mmol/L 139  Potassium 3.5 - 5.1 mmol/L 3.4(L)  Chloride 98 - 111 mmol/L 106  CO2 22 - 32 mmol/L 24  Calcium 8.9 - 10.3 mg/dL 8.9  Total Protein 6.5 - 8.1 g/dL 6.6  Total Bilirubin 0.3 - 1.2 mg/dL 0.6  Alkaline Phos 38 - 126 U/L 115  AST 15 - 41 U/L 32  ALT 0 - 44 U/L 42   CBC Latest Ref Rng & Units 07/30/2019  WBC 4.0 - 10.5 K/uL 6.7  Hemoglobin 13.0 - 17.0 g/dL 13.7  Hematocrit 39.0 - 52.0 % 40.1  Platelets 150 - 400 K/uL 162     Assessment and plan- Patient is a 68 y.o. male with metastatic colon cancer TX NX M1 with generalized lymphadenopathy K-ras/beta wild type.    He is here for on treatment assessment prior to cycle 3 of FOLFOX Avastin chemotherapy  Counts okay to proceed with cycle 3 of FOLFOX Avastin chemotherapy today.  Given that he needs FOLFOX Avastin chemotherapy until progression or toxicity, I will reduce his oxaliplatin dose to 65 milligrams per metered square to lower neuropathy complications especially given his age.  I will see him back in 2 weeks time for cycle 4 of FOLFOX Avastin chemotherapy.  Plan to repeat scans after 6 treatments.  Urine protein is currently pending and his  blood pressure is well controlled  Mucositis: Currently I do not see any evidence of  mucositis or thrush.  I have asked him to stop his nystatin at this time.  He can continue PRN Magic mouthwash  Hypokalemia: We will renew his oral potassium for 2 more weeks.   Visit Diagnosis 1. 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 ([place])   2. Encounter for antineoplastic chemotherapy   3. Encounter for monoclonal antibody treatment for malignancy   4. Hypokalemia      Dr. Randa Evens, MD, MPH [facility] [phone] 07/30/2019 10:16 AM

## 2019-07-30 NOTE — Progress Notes (Signed)
Pt doing well, he still feels a little sore in his mouth-like tender but not any actual sores he cna feel in his mouth. He still eats spicy food because of his culture.

## 2019-08-01 ENCOUNTER — Other Ambulatory Visit: Payer: Self-pay

## 2019-08-01 ENCOUNTER — Inpatient Hospital Stay: Payer: Medicare HMO

## 2019-08-01 VITALS — BP 143/77 | HR 69 | Temp 98.0°F | Resp 18

## 2019-08-01 DIAGNOSIS — C189 Malignant neoplasm of colon, unspecified: Secondary | ICD-10-CM

## 2019-08-01 DIAGNOSIS — C772 Secondary and unspecified malignant neoplasm of intra-abdominal lymph nodes: Secondary | ICD-10-CM

## 2019-08-01 DIAGNOSIS — Z5112 Encounter for antineoplastic immunotherapy: Secondary | ICD-10-CM | POA: Diagnosis not present

## 2019-08-01 MED ORDER — SODIUM CHLORIDE 0.9% FLUSH
10.0000 mL | INTRAVENOUS | Status: DC | PRN
  Administered 2019-08-01: 10 mL
  Filled 2019-08-01: qty 10

## 2019-08-01 MED ORDER — HEPARIN SOD (PORK) LOCK FLUSH 100 UNIT/ML IV SOLN
500.0000 [IU] | Freq: Once | INTRAVENOUS | Status: AC | PRN
  Administered 2019-08-01: 500 [IU]
  Filled 2019-08-01: qty 5

## 2019-08-07 ENCOUNTER — Telehealth: Payer: Self-pay | Admitting: Pulmonary Disease

## 2019-08-07 NOTE — Telephone Encounter (Signed)

## 2019-08-08 ENCOUNTER — Ambulatory Visit: Payer: Medicare HMO | Admitting: Pulmonary Disease

## 2019-08-08 ENCOUNTER — Other Ambulatory Visit: Payer: Self-pay

## 2019-08-08 ENCOUNTER — Encounter: Payer: Self-pay | Admitting: Pulmonary Disease

## 2019-08-08 VITALS — BP 122/74 | HR 99 | Temp 97.5°F | Ht 67.0 in | Wt 141.6 lb

## 2019-08-08 DIAGNOSIS — R0602 Shortness of breath: Secondary | ICD-10-CM

## 2019-08-08 NOTE — Patient Instructions (Signed)
1.  We will schedule breathing tests.  2.  We will see him in follow-up in 6 to 8 weeks time.

## 2019-08-13 ENCOUNTER — Other Ambulatory Visit: Payer: Self-pay

## 2019-08-13 ENCOUNTER — Encounter: Payer: Self-pay | Admitting: Oncology

## 2019-08-13 ENCOUNTER — Inpatient Hospital Stay (HOSPITAL_BASED_OUTPATIENT_CLINIC_OR_DEPARTMENT_OTHER): Payer: Medicare HMO | Admitting: Hospice and Palliative Medicine

## 2019-08-13 ENCOUNTER — Inpatient Hospital Stay (HOSPITAL_BASED_OUTPATIENT_CLINIC_OR_DEPARTMENT_OTHER): Payer: Medicare HMO | Admitting: Oncology

## 2019-08-13 ENCOUNTER — Inpatient Hospital Stay: Payer: Medicare HMO

## 2019-08-13 VITALS — BP 138/90 | HR 78 | Temp 98.8°F | Ht 67.0 in | Wt 144.0 lb

## 2019-08-13 DIAGNOSIS — C772 Secondary and unspecified malignant neoplasm of intra-abdominal lymph nodes: Secondary | ICD-10-CM

## 2019-08-13 DIAGNOSIS — C189 Malignant neoplasm of colon, unspecified: Secondary | ICD-10-CM

## 2019-08-13 DIAGNOSIS — Z5111 Encounter for antineoplastic chemotherapy: Secondary | ICD-10-CM

## 2019-08-13 DIAGNOSIS — Z515 Encounter for palliative care: Secondary | ICD-10-CM | POA: Diagnosis not present

## 2019-08-13 DIAGNOSIS — Z5112 Encounter for antineoplastic immunotherapy: Secondary | ICD-10-CM | POA: Diagnosis not present

## 2019-08-13 LAB — CBC WITH DIFFERENTIAL/PLATELET
Abs Immature Granulocytes: 0.02 K/uL (ref 0.00–0.07)
Basophils Absolute: 0.1 K/uL (ref 0.0–0.1)
Basophils Relative: 1 %
Eosinophils Absolute: 0.1 K/uL (ref 0.0–0.5)
Eosinophils Relative: 2 %
HCT: 40.1 % (ref 39.0–52.0)
Hemoglobin: 13.7 g/dL (ref 13.0–17.0)
Immature Granulocytes: 0 %
Lymphocytes Relative: 35 %
Lymphs Abs: 2.2 K/uL (ref 0.7–4.0)
MCH: 30.1 pg (ref 26.0–34.0)
MCHC: 34.2 g/dL (ref 30.0–36.0)
MCV: 88.1 fL (ref 80.0–100.0)
Monocytes Absolute: 0.7 K/uL (ref 0.1–1.0)
Monocytes Relative: 11 %
Neutro Abs: 3.2 K/uL (ref 1.7–7.7)
Neutrophils Relative %: 51 %
Platelets: 153 K/uL (ref 150–400)
RBC: 4.55 MIL/uL (ref 4.22–5.81)
RDW: 16.5 % — ABNORMAL HIGH (ref 11.5–15.5)
WBC: 6.2 K/uL (ref 4.0–10.5)
nRBC: 0 % (ref 0.0–0.2)

## 2019-08-13 LAB — COMPREHENSIVE METABOLIC PANEL
ALT: 32 U/L (ref 0–44)
AST: 29 U/L (ref 15–41)
Albumin: 3.5 g/dL (ref 3.5–5.0)
Alkaline Phosphatase: 111 U/L (ref 38–126)
Anion gap: 7 (ref 5–15)
BUN: 31 mg/dL — ABNORMAL HIGH (ref 8–23)
CO2: 25 mmol/L (ref 22–32)
Calcium: 9.3 mg/dL (ref 8.9–10.3)
Chloride: 107 mmol/L (ref 98–111)
Creatinine, Ser: 1.08 mg/dL (ref 0.61–1.24)
GFR calc Af Amer: >60 mL/min (ref >60)
GFR calc non Af Amer: >60 mL/min (ref >60)
Glucose, Bld: 107 mg/dL — ABNORMAL HIGH (ref 70–99)
Potassium: 4 mmol/L (ref 3.5–5.1)
Sodium: 139 mmol/L (ref 135–145)
Total Bilirubin: 0.5 mg/dL (ref 0.3–1.2)
Total Protein: 7.1 g/dL (ref 6.5–8.1)

## 2019-08-13 LAB — PROTEIN, URINE, RANDOM: Total Protein, Urine: 13 mg/dL

## 2019-08-13 MED ORDER — DEXAMETHASONE SODIUM PHOSPHATE 10 MG/ML IJ SOLN
10.0000 mg | Freq: Once | INTRAMUSCULAR | Status: AC
  Administered 2019-08-13: 10 mg via INTRAVENOUS
  Filled 2019-08-13: qty 1

## 2019-08-13 MED ORDER — LEUCOVORIN CALCIUM INJECTION 350 MG
700.0000 mg | Freq: Once | INTRAVENOUS | Status: AC
  Administered 2019-08-13: 700 mg via INTRAVENOUS
  Filled 2019-08-13: qty 25

## 2019-08-13 MED ORDER — POTASSIUM CHLORIDE CRYS ER 20 MEQ PO TBCR: 20.0000 meq | EXTENDED_RELEASE_TABLET | Freq: Every day | ORAL | 0 refills | Status: DC

## 2019-08-13 MED ORDER — SODIUM CHLORIDE 0.9 % IV SOLN
2400.0000 mg/m2 | INTRAVENOUS | Status: DC
  Administered 2019-08-13: 4300 mg via INTRAVENOUS
  Filled 2019-08-13: qty 86

## 2019-08-13 MED ORDER — FLUOROURACIL CHEMO INJECTION 2.5 GM/50ML
400.0000 mg/m2 | Freq: Once | INTRAVENOUS | Status: AC
  Administered 2019-08-13: 700 mg via INTRAVENOUS
  Filled 2019-08-13: qty 14

## 2019-08-13 MED ORDER — OXALIPLATIN CHEMO INJECTION 100 MG/20ML
65.0000 mg/m2 | Freq: Once | INTRAVENOUS | Status: AC
  Administered 2019-08-13: 115 mg via INTRAVENOUS
  Filled 2019-08-13: qty 20

## 2019-08-13 MED ORDER — SODIUM CHLORIDE 0.9 % IV SOLN
INTRAVENOUS | Status: DC
  Administered 2019-08-13: 10:00:00 via INTRAVENOUS
  Filled 2019-08-13: qty 250

## 2019-08-13 MED ORDER — DEXTROSE 5 % IV SOLN
Freq: Once | INTRAVENOUS | Status: AC
  Administered 2019-08-13: 11:00:00 via INTRAVENOUS
  Filled 2019-08-13: qty 250

## 2019-08-13 MED ORDER — SODIUM CHLORIDE 0.9% FLUSH
10.0000 mL | INTRAVENOUS | Status: DC | PRN
  Administered 2019-08-13: 10 mL via INTRAVENOUS
  Filled 2019-08-13: qty 10

## 2019-08-13 MED ORDER — PALONOSETRON HCL INJECTION 0.25 MG/5ML
0.2500 mg | Freq: Once | INTRAVENOUS | Status: AC
  Administered 2019-08-13: 0.25 mg via INTRAVENOUS
  Filled 2019-08-13: qty 5

## 2019-08-13 MED ORDER — HEPARIN SOD (PORK) LOCK FLUSH 100 UNIT/ML IV SOLN: 500.0000 [IU] | Freq: Once | INTRAVENOUS | Status: DC

## 2019-08-13 MED ORDER — SODIUM CHLORIDE 0.9 % IV SOLN
5.0000 mg/kg | Freq: Once | INTRAVENOUS | Status: AC
  Administered 2019-08-13: 350 mg via INTRAVENOUS
  Filled 2019-08-13: qty 14

## 2019-08-13 NOTE — Progress Notes (Signed)
[place]  [phone] [phone]   Name: Eric Simon Date: 08/13/2019 MRN: [id number]  DOB: 05-27-1951  Patient Care Team: Jodi Marble, MD as PCP - General (Internal Medicine) Clent Jacks, RN as Oncology Nurse Navigator    REASON FOR CONSULTATION: Palliative Care consult requested for this 68 y.o. 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  Patient initiated treatment with FOLFOX Avastin chemotherapy.  He was referred to palliative care to discuss goals and manage ongoing symptoms.   SOCIAL HISTORY:     reports that he has been smoking cigarettes. He has a 20.00 pack-year smoking history. He has never used smokeless tobacco. He reports that he does not drink alcohol or use drugs.   Patient is divorced.  He has no children.  He lives at home and rents a room to a friend.  Patient is originally from [place] but has lived here for 30 years.  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  ADVANCE DIRECTIVES:  Living will but not on file  CODE STATUS:   PAST MEDICAL HISTORY: Past Medical History:  Diagnosis Date  . Colon cancer ([place])   . COPD (chronic obstructive pulmonary disease) ([place])   . Hyperlipidemia     PAST SURGICAL HISTORY:  Past Surgical History:  Procedure Laterality Date  . COLONOSCOPY WITH PROPOFOL N/A 06/26/2019   Procedure: COLONOSCOPY WITH PROPOFOL;  Surgeon: [name], MD;  [facility] ENDOSCOPY;  Service: Gastroenterology;  Laterality: N/A;  . PORTA CATH INSERTION N/A 06/25/2019   Procedure: PORTA CATH INSERTION;  Surgeon: Algernon Huxley, MD;  Location: [place] [facility] LAB;  Service: Cardiovascular;  Laterality: N/A;    HEMATOLOGY/ONCOLOGY HISTORY:  Oncology History  Colon cancer metastasized to  intra-abdominal lymph node ([place])  06/19/2019 Cancer Staging   Staging form: Colon and Rectum, AJCC 8th Edition - Clinical stage from 06/19/2019: Stage Unknown (cTX, cNX, pM1) - Signed by Sindy Guadeloupe, MD on 06/20/2019   06/20/2019 Initial Diagnosis   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 ([place])   07/02/2019 -  Chemotherapy   The patient had palonosetron (ALOXI) injection 0.25 mg, 0.25 mg, Intravenous,  Once, 4 of 5 cycles Administration: 0.25 mg (07/02/2019), 0.25 mg (07/16/2019), 0.25 mg (07/30/2019) bevacizumab (AVASTIN) 350 mg in sodium chloride 0.9 % 100 mL chemo infusion, 5 mg/kg = 350 mg, Intravenous,  Once, 4 of 5 cycles Administration: 350 mg (07/02/2019), 350 mg (07/16/2019), 350 mg (07/30/2019) leucovorin 700 mg in dextrose 5 % 250 mL infusion, 391 mg/m2 = 716 mg, Intravenous,  Once, 4 of 5 cycles Administration: 700 mg (07/02/2019), 700 mg (07/30/2019) oxaliplatin (ELOXATIN) 150 mg in dextrose 5 % 500 mL chemo infusion, 85 mg/m2 = 150 mg, Intravenous,  Once, 4 of 5 cycles Dose modification: 65 mg/m2 (original dose 85 mg/m2, Cycle 3, Reason: Other (see comments), Comment: patient age and tolerance) Administration: 150 mg (07/02/2019), 150 mg (07/16/2019), 115 mg (07/30/2019) fluorouracil (ADRUCIL) chemo injection 700 mg, 400 mg/m2 = 700 mg, Intravenous,  Once, 4 of 5 cycles Administration: 700 mg (07/02/2019), 700 mg (07/16/2019), 700 mg (07/30/2019) fluorouracil (ADRUCIL) 4,300 mg in sodium chloride 0.9 % 64 mL chemo infusion, 2,400 mg/m2 = 4,300 mg, Intravenous, 1 Day/Dose, 4 of 5 cycles Administration: 4,300 mg (07/02/2019), 4,300 mg (07/16/2019), 4,300 mg (07/30/2019)  for chemotherapy treatment.  ALLERGIES:  has No Known Allergies.  MEDICATIONS:  Current Outpatient Medications  Medication Sig Dispense Refill  . aspirin EC 81 MG tablet Take 81 mg by mouth daily.    . cetirizine (ZYRTEC) 10 MG tablet Take 1 tablet (10 mg total) by mouth daily. (Patient taking differently: Take 10  mg by mouth daily as needed. ) 30 tablet 0  . dexamethasone (DECADRON) 4 MG tablet Take 2 tablets (8 mg total) by mouth daily. Start the day after chemotherapy for 2 days. Take with food. 30 tablet 1  . fluticasone (VERAMYST) 27.5 MCG/SPRAY nasal spray Place 2 sprays into the nose daily.    [name] lidocaine-prilocaine (EMLA) cream Apply to affected area once 30 g 3  . magic mouthwash w/lidocaine SOLN Take 5-10 mLs by mouth 4 (four) times daily as needed for mouth pain. 480 mL 3  . montelukast (SINGULAIR) 10 MG tablet Take 10 mg by mouth at bedtime.    [name] nystatin (MYCOSTATIN) 100000 UNIT/ML suspension Take 5 mLs (500,000 Units total) by mouth 3 (three) times daily. Swish and Spit 60 mL 0  . ondansetron (ZOFRAN) 8 MG tablet Take 1 tablet (8 mg total) by mouth 2 (two) times daily as needed for refractory nausea / vomiting. Start on day 3 after chemotherapy. 30 tablet 1  . pantoprazole (PROTONIX) 40 MG tablet Take 40 mg by mouth daily.    . potassium chloride SA (K-DUR) 20 MEQ tablet Take 1 tablet (20 mEq total) by mouth daily. 14 tablet 0  . prochlorperazine (COMPAZINE) 10 MG tablet Take 1 tablet (10 mg total) by mouth every 6 (six) hours as needed (Nausea or vomiting). 30 tablet 1  . simvastatin (ZOCOR) 20 MG tablet Take 20 mg by mouth daily.    . tamsulosin (FLOMAX) 0.4 MG CAPS capsule Take 0.4 mg by mouth.    . traMADol-acetaminophen (ULTRACET) 37.5-325 MG tablet Take 1 tablet by mouth every 6 (six) hours.     No current facility-administered medications for this visit.    Facility-Administered Medications Ordered in Other Visits  Medication Dose Route Frequency Provider Last Rate Last Dose  . 0.9 %  sodium chloride infusion   Intravenous Continuous Sindy Guadeloupe, MD 10 mL/hr at 08/13/19 [phone]    . fluorouracil (ADRUCIL) 4,300 mg in sodium chloride 0.9 % 64 mL chemo infusion  2,400 mg/m2 (Treatment Plan Recorded) Intravenous 1 day or 1 dose Sindy Guadeloupe, MD      . fluorouracil (ADRUCIL) chemo  injection 700 mg  400 mg/m2 (Treatment Plan Recorded) Intravenous Once Sindy Guadeloupe, MD      . heparin lock flush 100 unit/mL  500 Units Intravenous Once Sindy Guadeloupe, MD      . leucovorin 700 mg in dextrose 5 % 250 mL infusion  700 mg Intravenous Once Sindy Guadeloupe, MD 143 mL/hr at 08/13/19 1058 700 mg at 08/13/19 1058  . oxaliplatin (ELOXATIN) 115 mg in dextrose 5 % 500 mL chemo infusion  65 mg/m2 (Treatment Plan Recorded) Intravenous Once Sindy Guadeloupe, MD 262 mL/hr at 08/13/19 1100 115 mg at 08/13/19 1100  . sodium chloride flush (NS) 0.9 % injection 10 mL  10 mL Intravenous PRN Sindy Guadeloupe, MD   10 mL at 08/13/19 0841    VITAL SIGNS: There were no vitals taken for this visit. There were no vitals filed for this visit.  Estimated body mass index is 22.55 kg/m as calculated from the following:   Height as of an earlier  encounter on 08/13/19: 5\' 7"  (1.702 m).   Weight as of an earlier encounter on 08/13/19: 144 lb (65.3 kg).  LABS: CBC:    Component Value Date/Time   WBC 6.2 08/13/2019 0841   HGB 13.7 08/13/2019 0841   HGB 16.1 08/18/2012 1321   HCT 40.1 08/13/2019 0841   HCT 45.4 08/18/2012 1321   PLT 153 08/13/2019 0841   PLT 192 08/18/2012 1321   MCV 88.1 08/13/2019 0841   MCV 88 08/18/2012 1321   NEUTROABS 3.2 08/13/2019 0841   LYMPHSABS 2.2 08/13/2019 0841   MONOABS 0.7 08/13/2019 0841   EOSABS 0.1 08/13/2019 0841   BASOSABS 0.1 08/13/2019 0841   Comprehensive Metabolic Panel:    Component Value Date/Time   NA 139 08/13/2019 0841   NA 139 08/18/2012 1321   K 4.0 08/13/2019 0841   K 3.7 08/18/2012 1321   CL 107 08/13/2019 0841   CL 104 08/18/2012 1321   CO2 25 08/13/2019 0841   CO2 27 08/18/2012 1321   BUN 31 (H) 08/13/2019 0841   BUN 18 08/18/2012 1321   CREATININE 1.08 08/13/2019 0841   CREATININE 0.82 08/18/2012 1321   GLUCOSE 107 (H) 08/13/2019 0841   GLUCOSE 68 08/18/2012 1321   CALCIUM 9.3 08/13/2019 0841   CALCIUM 9.0 08/18/2012 1321   AST 29  08/13/2019 0841   AST 36 08/18/2012 1321   ALT 32 08/13/2019 0841   ALT 51 08/18/2012 1321   ALKPHOS 111 08/13/2019 0841   ALKPHOS 88 08/18/2012 1321   BILITOT 0.5 08/13/2019 0841   BILITOT 0.4 08/18/2012 1321   PROT 7.1 08/13/2019 0841   PROT 8.0 08/18/2012 1321   ALBUMIN 3.5 08/13/2019 0841   ALBUMIN 4.1 08/18/2012 1321    RADIOGRAPHIC STUDIES: No results found.  PERFORMANCE STATUS (ECOG) : 0 - Asymptomatic  Review of Systems Unless otherwise noted, a complete review of systems is negative.  Physical Exam General: NAD, frail appearing, thin Pulmonary: Unlabored Extremities: no edema Skin: no rashes Neurological: Weakness but otherwise nonfocal  IMPRESSION: Routine follow-up visit today.  Patient was seen in the infusion area.  Patient reports that he is doing well and denies any significant changes or concerns.  He denies any distressing symptoms.  He does have some fatigue but says that it is not impacting his ability to engage in activities he finds enjoyable.  Patient reports that he is coping well with his cancer.  He denies depression, anxiety, or insomnia.  He says he is trying to be optimistic.  He says that he is eating well.  His weight appears stable 144 pounds.  We again discussed advance care planning.  Patient says that he completed ACP documents but they do not appear yet to be on file.  He says that he appointed a friend, Eilene Ghazi, to be his healthcare power of attorney.  We again discussed completing a MOST Form but patient felt that those decisions were not necessary at this time.  He says that he would want to defer to doctors and his [facility] POA to make those decisions but I explained that it is always better for patients to have autonomy in their decision making.    PLAN: -Continue current scope of treatment -ACP documents to be brought in to be filed -Discussed MOST Form and will complete during future visit -RTC 3 to 4 weeks   Patient  expressed understanding and was in agreement with this plan. He also understands that He can call the clinic at any  time with any questions, concerns, or complaints.     Time Total: 15 minutes  Visit consisted of counseling and education dealing with the complex and emotionally intense issues of symptom management and palliative care in the setting of serious and potentially life-threatening illness.Greater than 50%  of this time was spent counseling and coordinating care related to the above assessment and plan.  Signed by: Altha Harm, PhD, NP-C [phone] (Work Cell)

## 2019-08-13 NOTE — Progress Notes (Signed)
Patient stated that he had been doing well. 

## 2019-08-13 NOTE — Progress Notes (Signed)
Hematology/Oncology Consult note [facility]  [phone] [phone]  Patient Care Team: Jodi Marble, MD as PCP - General (Internal Medicine) Clent Jacks, RN as Oncology Nurse Navigator   Name of the patient: Eric Simon  [id number]  1951/10/26   Date of visit: 08/13/19  Diagnosis- metastatic colon cancer with lymph node metastases K-ras/BRAF wild-type  Chief complaint/ Reason for visit-on treatment assessment prior to cycle 4 of FOLFOX Avastin chemotherapy  Heme/Onc history: Patient is a 68 yr old male with >40 pack year history of smoking. He currently smokes 0.5ppd. he presented to the ER with symptoms of left sided chest pain and left arm pain. Troponin was negative ekg was unremarkable. Ct chest showed no PE. He was incidentally noted to have mediastinal and retrocrural adenopathy and a 2.1X2.3X4.4 cm retroaortic soft tissue lesion in the posterior left chest. 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 Low-grade activity in the left lateral fifth and sixth ribs associated with nondisplaced fractures likely reflecting healing response problem malignancy.  Patient underwent CT-guided biopsy of the retroperitoneal lymph node pathology showed metastatic adenocarcinoma compatible with colorectal origin. CK7 negative. CK20 positive. CDX 2+. TTF-1 negative. PSA negative. This pattern of immunoreactivity supports the above diagnosis. Patient underwent colonoscopy which showed sigmoid mass that was consistent with adenocarcinoma. Rast panel testing showed that he was wild-type for both K-ras and BRAF   Interval history-reports having nasal congestion as well as congestion in his years.  He has been taking montelukast and is at present but it has not been helping him much.  Denies any nausea vomiting  fatigue or other complaints  ECOG PS- 1 Pain scale- 0   Review of systems- Review of Systems  Constitutional: Negative for chills, fever, malaise/fatigue and weight loss.  HENT: Positive for congestion. Negative for ear discharge and nosebleeds.   Eyes: Negative for blurred vision.  Respiratory: Negative for cough, hemoptysis, sputum production, shortness of breath and wheezing.   Cardiovascular: Negative for chest pain, palpitations, orthopnea and claudication.  Gastrointestinal: Negative for abdominal pain, blood in stool, constipation, diarrhea, heartburn, melena, nausea and vomiting.  Genitourinary: Negative for dysuria, flank pain, frequency, hematuria and urgency.  Musculoskeletal: Negative for back pain, joint pain and myalgias.  Skin: Negative for rash.  Neurological: Negative for dizziness, tingling, focal weakness, seizures, weakness and headaches.  Endo/Heme/Allergies: Does not bruise/bleed easily.  Psychiatric/Behavioral: Negative for depression and suicidal ideas. The patient does not have insomnia.       No Known Allergies   Past Medical History:  Diagnosis Date  . Colon cancer ([place])   . COPD (chronic obstructive pulmonary disease) ([place])   . Hyperlipidemia      Past Surgical History:  Procedure Laterality Date  . COLONOSCOPY WITH PROPOFOL N/A 06/26/2019   Procedure: COLONOSCOPY WITH PROPOFOL;  Surgeon: [name], MD;  Location: [facility] ENDOSCOPY;  Service: Gastroenterology;  Laterality: N/A;  . PORTA CATH INSERTION N/A 06/25/2019   Procedure: PORTA CATH INSERTION;  Surgeon: Algernon Huxley, MD;  Location: [place] [facility] LAB;  Service: Cardiovascular;  Laterality: N/A;    Social History   Socioeconomic History  . Marital status: Single    Spouse name: Not on file  . Number of children: Not on file  . Years of education: Not on file  . Highest education level: Not on file  Occupational History  . Not on file  Social Needs  . Financial resource strain: Not  hard at all  . Food insecurity    Worry: Never true    Inability: Never true  . Transportation needs    Medical: No    Non-medical: No  Tobacco Use  . Smoking status: Current Every Day Smoker    Packs/day: 0.50    Years: 40.00    Pack years: 20.00    Types: Cigarettes  . Smokeless tobacco: Never Used  . Tobacco comment: smoking 40 years  Substance and Sexual Activity  . Alcohol use: No  . Drug use: No  . Sexual activity: Not Currently  Lifestyle  . Physical activity    Days per week: Not on file    Minutes per session: Not on file  . Stress: Not at all  Relationships  . Social connections    Talks on phone: More than three times a week    Gets together: Not on file    Attends religious service: Not on file    Active member of club or organization: Not on file    Attends meetings of clubs or organizations: Not on file    Relationship status: Not on file  . Intimate partner violence    Fear of current or ex partner: No    Emotionally abused: No    Physically abused: No    Forced sexual activity: No  Other Topics Concern  . Not on file  Social History Narrative  . Not on file    Family History  Problem Relation Age of Onset  . Brain cancer Father      Current Outpatient Medications:  .  aspirin EC 81 MG tablet, Take 81 mg by mouth daily., Disp: , Rfl:  .  cetirizine (ZYRTEC) 10 MG tablet, Take 1 tablet (10 mg total) by mouth daily. (Patient taking differently: Take 10 mg by mouth daily as needed. ), Disp: 30 tablet, Rfl: 0 .  dexamethasone (DECADRON) 4 MG tablet, Take 2 tablets (8 mg total) by mouth daily. Start the day after chemotherapy for 2 days. Take with food., Disp: 30 tablet, Rfl: 1 .  fluticasone (VERAMYST) 27.5 MCG/SPRAY nasal spray, Place 2 sprays into the nose daily., Disp: , Rfl:  .  lidocaine-prilocaine (EMLA) cream, Apply to affected area once, Disp: 30 g, Rfl: 3 .  magic mouthwash w/lidocaine SOLN, Take 5-10 mLs by mouth 4 (four) times daily as  needed for mouth pain., Disp: 480 mL, Rfl: 3 .  montelukast (SINGULAIR) 10 MG tablet, Take 10 mg by mouth at bedtime., Disp: , Rfl:  .  nystatin (MYCOSTATIN) 100000 UNIT/ML suspension, Take 5 mLs (500,000 Units total) by mouth 3 (three) times daily. Swish and Spit, Disp: 60 mL, Rfl: 0 .  ondansetron (ZOFRAN) 8 MG tablet, Take 1 tablet (8 mg total) by mouth 2 (two) times daily as needed for refractory nausea / vomiting. Start on day 3 after chemotherapy., Disp: 30 tablet, Rfl: 1 .  pantoprazole (PROTONIX) 40 MG tablet, Take 40 mg by mouth daily., Disp: , Rfl:  .  potassium chloride SA (K-DUR) 20 MEQ tablet, Take 1 tablet (20 mEq total) by mouth daily., Disp: 14 tablet, Rfl: 0 .  prochlorperazine (COMPAZINE) 10 MG tablet, Take 1 tablet (10 mg total) by mouth every 6 (six) hours as needed (Nausea or vomiting)., Disp: 30 tablet, Rfl: 1 .  simvastatin (ZOCOR) 20 MG tablet, Take 20 mg by mouth daily., Disp: , Rfl:  .  tamsulosin (FLOMAX) 0.4 MG CAPS capsule,  Take 0.4 mg by mouth., Disp: , Rfl:  .  traMADol-acetaminophen (ULTRACET) 37.5-325 MG tablet, Take 1 tablet by mouth every 6 (six) hours., Disp: , Rfl:  No current facility-administered medications for this visit.   Facility-Administered Medications Ordered in Other Visits:  .  0.9 %  sodium chloride infusion, , Intravenous, Continuous, Sindy Guadeloupe, MD, Last Rate: 10 mL/hr at 08/13/19 [phone] .  bevacizumab (AVASTIN) 350 mg in sodium chloride 0.9 % 100 mL chemo infusion, 5 mg/kg (Treatment Plan Recorded), Intravenous, Once, Sindy Guadeloupe, MD .  dexamethasone (DECADRON) injection 10 mg, 10 mg, Intravenous, Once, Sindy Guadeloupe, MD .  dextrose 5 % solution, , Intravenous, Once, Sindy Guadeloupe, MD .  fluorouracil (ADRUCIL) 4,300 mg in sodium chloride 0.9 % 64 mL chemo infusion, 2,400 mg/m2 (Treatment Plan Recorded), Intravenous, 1 day or 1 dose, Sindy Guadeloupe, MD .  fluorouracil (ADRUCIL) chemo injection 700 mg, 400 mg/m2 (Treatment Plan Recorded),  Intravenous, Once, Sindy Guadeloupe, MD .  heparin lock flush 100 unit/mL, 500 Units, Intravenous, Once, Sindy Guadeloupe, MD .  leucovorin 700 mg in dextrose 5 % 250 mL infusion, 700 mg, Intravenous, Once, Sindy Guadeloupe, MD .  oxaliplatin (ELOXATIN) 115 mg in dextrose 5 % 500 mL chemo infusion, 65 mg/m2 (Treatment Plan Recorded), Intravenous, Once, Sindy Guadeloupe, MD .  palonosetron (ALOXI) injection 0.25 mg, 0.25 mg, Intravenous, Once, Sindy Guadeloupe, MD .  sodium chloride flush (NS) 0.9 % injection 10 mL, 10 mL, Intravenous, PRN, Sindy Guadeloupe, MD, 10 mL at 08/13/19 0841  Physical exam:  Vitals:   08/13/19 0853  BP: 138/90  Pulse: 78  Temp: 98.8 F (37.1 C)  TempSrc: Tympanic  Weight: 144 lb (65.3 kg)  Height: '5\' 7"'  (1.702 m)   Physical Exam Constitutional:      General: He is not in acute distress. HENT:     Head: Normocephalic and atraumatic.     Mouth/Throat:     Mouth: Mucous membranes are moist.     Pharynx: Oropharynx is clear.  Eyes:     Pupils: Pupils are equal, round, and reactive to light.  Neck:     Musculoskeletal: Normal range of motion.  Cardiovascular:     Rate and Rhythm: Normal rate and regular rhythm.     Heart sounds: Normal heart sounds.  Pulmonary:     Effort: Pulmonary effort is normal.     Breath sounds: Normal breath sounds.  Abdominal:     General: Bowel sounds are normal.     Palpations: Abdomen is soft.  Skin:    General: Skin is warm and dry.  Neurological:     Mental Status: He is alert and oriented to person, place, and time.      CMP Latest Ref Rng & Units 08/13/2019  Glucose 70 - 99 mg/dL 107(H)  BUN 8 - 23 mg/dL 31(H)  Creatinine 0.61 - 1.24 mg/dL 1.08  Sodium 135 - 145 mmol/L 139  Potassium 3.5 - 5.1 mmol/L 4.0  Chloride 98 - 111 mmol/L 107  CO2 22 - 32 mmol/L 25  Calcium 8.9 - 10.3 mg/dL 9.3  Total Protein 6.5 - 8.1 g/dL 7.1  Total Bilirubin 0.3 - 1.2 mg/dL 0.5  Alkaline Phos 38 - 126 U/L 111  AST 15 - 41 U/L 29  ALT 0 - 44  U/L 32   CBC Latest Ref Rng & Units 08/13/2019  WBC 4.0 - 10.5 K/uL 6.2  Hemoglobin 13.0 - 17.0 g/dL  13.7  Hematocrit 39.0 - 52.0 % 40.1  Platelets 150 - 400 K/uL 153      Assessment and plan- Patient is a 68 y.o. male with metastatic colon cancer TX NX M1 with generalized lymphadenopathy K-ras/beta wild type.  He is here for an treatment assessment prior to cycle 4 of FOLFOX Avastin chemotherapy  Constipated proceed with cycle 4 of FOLFOX Avastin chemotherapy today.  His blood pressure is stable.  Urine protein is currently pending.  I will see him back in 2 weeks time with CBC with differential, CMP for cycle 5.  Plan to get interim scans after 6 treatments.  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  Nasal congestion: Continue cetrizine and montelukast.  If symptoms persist I will consider giving him antibiotic   Visit Diagnosis 1. Encounter for antineoplastic chemotherapy   2. 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 ([place])      Dr. Randa Evens, MD, MPH [facility] [phone] 08/13/2019 9:42 AM

## 2019-08-14 LAB — CEA: CEA: 4.9 ng/mL — ABNORMAL HIGH (ref 0.0–4.7)

## 2019-08-15 ENCOUNTER — Inpatient Hospital Stay: Payer: Medicare HMO | Attending: Oncology

## 2019-08-15 ENCOUNTER — Other Ambulatory Visit: Payer: Self-pay

## 2019-08-15 DIAGNOSIS — F17210 Nicotine dependence, cigarettes, uncomplicated: Secondary | ICD-10-CM | POA: Insufficient documentation

## 2019-08-15 DIAGNOSIS — C778 Secondary and unspecified malignant neoplasm of lymph nodes of multiple regions: Secondary | ICD-10-CM | POA: Insufficient documentation

## 2019-08-15 DIAGNOSIS — C189 Malignant neoplasm of colon, unspecified: Secondary | ICD-10-CM | POA: Diagnosis present

## 2019-08-15 DIAGNOSIS — Z5112 Encounter for antineoplastic immunotherapy: Secondary | ICD-10-CM | POA: Diagnosis present

## 2019-08-15 DIAGNOSIS — Z452 Encounter for adjustment and management of vascular access device: Secondary | ICD-10-CM | POA: Diagnosis not present

## 2019-08-15 DIAGNOSIS — C772 Secondary and unspecified malignant neoplasm of intra-abdominal lymph nodes: Secondary | ICD-10-CM

## 2019-08-15 DIAGNOSIS — Z5111 Encounter for antineoplastic chemotherapy: Secondary | ICD-10-CM | POA: Diagnosis present

## 2019-08-15 MED ORDER — HEPARIN SOD (PORK) LOCK FLUSH 100 UNIT/ML IV SOLN
500.0000 [IU] | Freq: Once | INTRAVENOUS | Status: AC | PRN
  Administered 2019-08-15: 500 [IU]

## 2019-08-15 MED ORDER — HEPARIN SOD (PORK) LOCK FLUSH 100 UNIT/ML IV SOLN
INTRAVENOUS | Status: AC
  Filled 2019-08-15: qty 5

## 2019-08-15 MED ORDER — SODIUM CHLORIDE 0.9% FLUSH
10.0000 mL | INTRAVENOUS | Status: DC | PRN
  Administered 2019-08-15: 10 mL
  Filled 2019-08-15: qty 10

## 2019-08-23 ENCOUNTER — Telehealth: Payer: Self-pay | Admitting: *Deleted

## 2019-08-23 NOTE — Telephone Encounter (Signed)
Call returned to patient and he was advised that he can take sucralfate by leaving a message voice mail

## 2019-08-23 NOTE — Telephone Encounter (Signed)
It would not interfere with chemo. He can take it for heartburn if it helps him

## 2019-08-23 NOTE — Telephone Encounter (Signed)
Patient called reporting that his doc ordered Sucralfate and he is asking if it alright to take this with his chemotherapy. Please advise

## 2019-08-24 NOTE — Progress Notes (Signed)
Called patient no answer left message  

## 2019-08-27 ENCOUNTER — Inpatient Hospital Stay: Payer: Medicare HMO

## 2019-08-27 ENCOUNTER — Encounter: Payer: Self-pay | Admitting: Oncology

## 2019-08-27 ENCOUNTER — Inpatient Hospital Stay (HOSPITAL_BASED_OUTPATIENT_CLINIC_OR_DEPARTMENT_OTHER): Payer: Medicare HMO | Admitting: Oncology

## 2019-08-27 ENCOUNTER — Inpatient Hospital Stay (HOSPITAL_BASED_OUTPATIENT_CLINIC_OR_DEPARTMENT_OTHER): Payer: Medicare HMO | Admitting: Hospice and Palliative Medicine

## 2019-08-27 ENCOUNTER — Other Ambulatory Visit: Payer: Self-pay

## 2019-08-27 VITALS — BP 125/71 | HR 80 | Temp 97.9°F | Resp 16 | Ht 67.0 in | Wt 144.1 lb

## 2019-08-27 DIAGNOSIS — C772 Secondary and unspecified malignant neoplasm of intra-abdominal lymph nodes: Secondary | ICD-10-CM

## 2019-08-27 DIAGNOSIS — C189 Malignant neoplasm of colon, unspecified: Secondary | ICD-10-CM | POA: Diagnosis not present

## 2019-08-27 DIAGNOSIS — Z515 Encounter for palliative care: Secondary | ICD-10-CM

## 2019-08-27 DIAGNOSIS — Z5111 Encounter for antineoplastic chemotherapy: Secondary | ICD-10-CM | POA: Diagnosis not present

## 2019-08-27 DIAGNOSIS — Z5112 Encounter for antineoplastic immunotherapy: Secondary | ICD-10-CM | POA: Diagnosis not present

## 2019-08-27 DIAGNOSIS — Z95828 Presence of other vascular implants and grafts: Secondary | ICD-10-CM

## 2019-08-27 LAB — CBC WITH DIFFERENTIAL/PLATELET
Abs Immature Granulocytes: 0.02 10*3/uL (ref 0.00–0.07)
Basophils Absolute: 0 10*3/uL (ref 0.0–0.1)
Basophils Relative: 1 %
Eosinophils Absolute: 0.1 10*3/uL (ref 0.0–0.5)
Eosinophils Relative: 2 %
HCT: 38.6 % — ABNORMAL LOW (ref 39.0–52.0)
Hemoglobin: 13.1 g/dL (ref 13.0–17.0)
Immature Granulocytes: 0 %
Lymphocytes Relative: 29 %
Lymphs Abs: 1.9 10*3/uL (ref 0.7–4.0)
MCH: 30.3 pg (ref 26.0–34.0)
MCHC: 33.9 g/dL (ref 30.0–36.0)
MCV: 89.1 fL (ref 80.0–100.0)
Monocytes Absolute: 0.8 10*3/uL (ref 0.1–1.0)
Monocytes Relative: 13 %
Neutro Abs: 3.6 10*3/uL (ref 1.7–7.7)
Neutrophils Relative %: 55 %
Platelets: 153 10*3/uL (ref 150–400)
RBC: 4.33 MIL/uL (ref 4.22–5.81)
RDW: 17.5 % — ABNORMAL HIGH (ref 11.5–15.5)
WBC: 6.4 10*3/uL (ref 4.0–10.5)
nRBC: 0 % (ref 0.0–0.2)

## 2019-08-27 LAB — COMPREHENSIVE METABOLIC PANEL
ALT: 31 U/L (ref 0–44)
AST: 34 U/L (ref 15–41)
Albumin: 3.5 g/dL (ref 3.5–5.0)
Alkaline Phosphatase: 104 U/L (ref 38–126)
Anion gap: 8 (ref 5–15)
BUN: 20 mg/dL (ref 8–23)
CO2: 23 mmol/L (ref 22–32)
Calcium: 9.1 mg/dL (ref 8.9–10.3)
Chloride: 105 mmol/L (ref 98–111)
Creatinine, Ser: 0.76 mg/dL (ref 0.61–1.24)
GFR calc Af Amer: >60 mL/min (ref >60)
GFR calc non Af Amer: >60 mL/min (ref >60)
Glucose, Bld: 100 mg/dL — ABNORMAL HIGH (ref 70–99)
Potassium: 3.7 mmol/L (ref 3.5–5.1)
Sodium: 136 mmol/L (ref 135–145)
Total Bilirubin: 0.7 mg/dL (ref 0.3–1.2)
Total Protein: 6.9 g/dL (ref 6.5–8.1)

## 2019-08-27 LAB — PROTEIN, URINE, RANDOM: Total Protein, Urine: 8 mg/dL

## 2019-08-27 MED ORDER — PALONOSETRON HCL INJECTION 0.25 MG/5ML
0.2500 mg | Freq: Once | INTRAVENOUS | Status: AC
  Administered 2019-08-27: 0.25 mg via INTRAVENOUS

## 2019-08-27 MED ORDER — SODIUM CHLORIDE 0.9 % IV SOLN
Freq: Once | INTRAVENOUS | Status: AC
  Administered 2019-08-27: 10:00:00 via INTRAVENOUS
  Filled 2019-08-27: qty 250

## 2019-08-27 MED ORDER — LEUCOVORIN CALCIUM INJECTION 350 MG
700.0000 mg | Freq: Once | INTRAVENOUS | Status: AC
  Administered 2019-08-27: 700 mg via INTRAVENOUS
  Filled 2019-08-27: qty 25

## 2019-08-27 MED ORDER — HEPARIN SOD (PORK) LOCK FLUSH 100 UNIT/ML IV SOLN: 500.0000 [IU] | Freq: Once | INTRAVENOUS | Status: DC | PRN

## 2019-08-27 MED ORDER — FLUOROURACIL CHEMO INJECTION 2.5 GM/50ML
400.0000 mg/m2 | Freq: Once | INTRAVENOUS | Status: AC
  Administered 2019-08-27: 700 mg via INTRAVENOUS
  Filled 2019-08-27: qty 14

## 2019-08-27 MED ORDER — DEXAMETHASONE SODIUM PHOSPHATE 10 MG/ML IJ SOLN
10.0000 mg | Freq: Once | INTRAMUSCULAR | Status: AC
  Administered 2019-08-27: 10 mg via INTRAVENOUS

## 2019-08-27 MED ORDER — DEXAMETHASONE SODIUM PHOSPHATE 10 MG/ML IJ SOLN
INTRAMUSCULAR | Status: AC
  Filled 2019-08-27: qty 1

## 2019-08-27 MED ORDER — DEXTROSE 5 % IV SOLN
Freq: Once | INTRAVENOUS | Status: AC
  Administered 2019-08-27: 11:00:00 via INTRAVENOUS
  Filled 2019-08-27: qty 250

## 2019-08-27 MED ORDER — SODIUM CHLORIDE 0.9 % IV SOLN
2400.0000 mg/m2 | INTRAVENOUS | Status: DC
  Administered 2019-08-27: 4300 mg via INTRAVENOUS
  Filled 2019-08-27: qty 86

## 2019-08-27 MED ORDER — OXALIPLATIN CHEMO INJECTION 100 MG/20ML
65.0000 mg/m2 | Freq: Once | INTRAVENOUS | Status: AC
  Administered 2019-08-27: 115 mg via INTRAVENOUS
  Filled 2019-08-27: qty 20

## 2019-08-27 MED ORDER — SODIUM CHLORIDE 0.9 % IV SOLN
5.0000 mg/kg | Freq: Once | INTRAVENOUS | Status: AC
  Administered 2019-08-27: 350 mg via INTRAVENOUS
  Filled 2019-08-27: qty 14

## 2019-08-27 MED ORDER — SODIUM CHLORIDE 0.9% FLUSH
10.0000 mL | Freq: Once | INTRAVENOUS | Status: AC
  Administered 2019-08-27: 10 mL via INTRAVENOUS
  Filled 2019-08-27: qty 10

## 2019-08-27 NOTE — Progress Notes (Signed)
Hematology/Oncology Consult note [facility]  [phone] [phone]  Patient Care Team: Jodi Marble, MD as PCP - General (Internal Medicine) Clent Jacks, RN as Oncology Nurse Navigator   Name of the patient: Eric Simon  [id number]  [birth year]   Date of visit: 08/27/19  Diagnosis- metastatic colon cancer with lymph node metastases K-ras/BRAF wild-type  Chief complaint/ Reason for visit-on treatment assessment prior to cycle 5 of FOLFOX Avastin chemotherapy  Heme/Onc history: Patient is a 68 yr old male with >40 pack year history of smoking. He currently smokes 0.5ppd. he presented to the ER with symptoms of left sided chest pain and left arm pain. Troponin was negative ekg was unremarkable. Ct chest showed no PE. He was incidentally noted to have mediastinal and retrocrural adenopathy and a 2.1X2.3X4.4 cm retroaortic soft tissue lesion in the posterior left chest. 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 Low-grade activity in the left lateral fifth and sixth ribs associated with nondisplaced fractures likely reflecting healing response problem malignancy.  Patient underwent CT-guided biopsy of the retroperitoneal lymph node pathology showed metastatic adenocarcinoma compatible with colorectal origin. CK7 negative. CK20 positive. CDX 2+. TTF-1 negative. PSA negative. This pattern of immunoreactivity supports the above diagnosis. Patient underwent colonoscopy which showed sigmoid mass that was consistent with adenocarcinoma. Rast panel testing showed that he was wild-type for both K-ras and BRAF  Interval history-he still has symptoms of nasal congestion and has been trying some new medications prescribed by his primary care doctor.  Denies other complaints at this time   ECOG PS- 1 Pain scale- 0    Review of systems- Review of Systems  Constitutional: Negative for chills, fever, malaise/fatigue and weight loss.  HENT: Negative for congestion, ear discharge and nosebleeds.   Eyes: Negative for blurred vision.  Respiratory: Negative for cough, hemoptysis, sputum production, shortness of breath and wheezing.   Cardiovascular: Negative for chest pain, palpitations, orthopnea and claudication.  Gastrointestinal: Negative for abdominal pain, blood in stool, constipation, diarrhea, heartburn, melena, nausea and vomiting.  Genitourinary: Negative for dysuria, flank pain, frequency, hematuria and urgency.  Musculoskeletal: Negative for back pain, joint pain and myalgias.  Skin: Negative for rash.  Neurological: Negative for dizziness, tingling, focal weakness, seizures, weakness and headaches.  Endo/Heme/Allergies: Does not bruise/bleed easily.  Psychiatric/Behavioral: Negative for depression and suicidal ideas. The patient does not have insomnia.       No Known Allergies   Past Medical History:  Diagnosis Date  . Colon cancer ([place])   . COPD (chronic obstructive pulmonary disease) ([place])   . Hyperlipidemia      Past Surgical History:  Procedure Laterality Date  . COLONOSCOPY WITH PROPOFOL N/A 06/26/2019   Procedure: COLONOSCOPY WITH PROPOFOL;  Surgeon: [name], MD;  Location: [facility] ENDOSCOPY;  Service: Gastroenterology;  Laterality: N/A;  . PORTA CATH INSERTION N/A 06/25/2019   Procedure: PORTA CATH INSERTION;  Surgeon: Algernon Huxley, MD;  Location: [place] CV LAB;  Service: Cardiovascular;  Laterality: N/A;    Social History   Socioeconomic History  . Marital status: Single    Spouse name: Not on file  . Number of children: Not on file  . Years of education: Not on file  . Highest education level: Not on file  Occupational History  . Not on file  Social Needs  . Financial resource strain: Not hard at all  .  Food insecurity    Worry: Never true    Inability:  Never true  . Transportation needs    Medical: No    Non-medical: No  Tobacco Use  . Smoking status: Current Every Day Smoker    Packs/day: 0.50    Years: 40.00    Pack years: 20.00    Types: Cigarettes  . Smokeless tobacco: Never Used  . Tobacco comment: smoking 40 years  Substance and Sexual Activity  . Alcohol use: No  . Drug use: No  . Sexual activity: Not Currently  Lifestyle  . Physical activity    Days per week: Not on file    Minutes per session: Not on file  . Stress: Not at all  Relationships  . Social connections    Talks on phone: More than three times a week    Gets together: Not on file    Attends religious service: Not on file    Active member of club or organization: Not on file    Attends meetings of clubs or organizations: Not on file    Relationship status: Not on file  . Intimate partner violence    Fear of current or ex partner: No    Emotionally abused: No    Physically abused: No    Forced sexual activity: No  Other Topics Concern  . Not on file  Social History Narrative  . Not on file    Family History  Problem Relation Age of Onset  . Brain cancer Father      Current Outpatient Medications:  .  fluticasone (VERAMYST) 27.5 MCG/SPRAY nasal spray, Place 2 sprays into the nose daily., Disp: , Rfl:  .  lidocaine-prilocaine (EMLA) cream, Apply to affected area once, Disp: 30 g, Rfl: 3 .  magic mouthwash w/lidocaine SOLN, Take 5-10 mLs by mouth 4 (four) times daily as needed for mouth pain., Disp: 480 mL, Rfl: 3 .  montelukast (SINGULAIR) 10 MG tablet, Take 10 mg by mouth at bedtime., Disp: , Rfl:  .  nystatin (MYCOSTATIN) 100000 UNIT/ML suspension, Take 5 mLs (500,000 Units total) by mouth 3 (three) times daily. Swish and Spit, Disp: 60 mL, Rfl: 0 .  ondansetron (ZOFRAN) 8 MG tablet, Take 1 tablet (8 mg total) by mouth 2 (two) times daily as needed for refractory nausea / vomiting. Start on day 3 after chemotherapy., Disp: 30 tablet, Rfl: 1 .   pantoprazole (PROTONIX) 40 MG tablet, Take 40 mg by mouth daily., Disp: , Rfl:  .  potassium chloride SA (K-DUR) 20 MEQ tablet, Take 1 tablet (20 mEq total) by mouth daily., Disp: 14 tablet, Rfl: 0 .  prochlorperazine (COMPAZINE) 10 MG tablet, Take 1 tablet (10 mg total) by mouth every 6 (six) hours as needed (Nausea or vomiting)., Disp: 30 tablet, Rfl: 1 .  simvastatin (ZOCOR) 20 MG tablet, Take 20 mg by mouth daily., Disp: , Rfl:  .  tamsulosin (FLOMAX) 0.4 MG CAPS capsule, Take 0.4 mg by mouth., Disp: , Rfl:  .  traMADol-acetaminophen (ULTRACET) 37.5-325 MG tablet, Take 1 tablet by mouth every 6 (six) hours., Disp: , Rfl:  .  aspirin EC 81 MG tablet, Take 81 mg by mouth daily., Disp: , Rfl:  .  cetirizine (ZYRTEC) 10 MG tablet, Take 1 tablet (10 mg total) by mouth daily. (Patient taking differently: Take 10 mg by mouth daily as needed. ), Disp: 30 tablet, Rfl: 0 .  dexamethasone (DECADRON) 4 MG tablet, Take 2 tablets (8 mg total) by mouth daily.  Start the day after chemotherapy for 2 days. Take with food., Disp: 30 tablet, Rfl: 1 No current facility-administered medications for this visit.   Facility-Administered Medications Ordered in Other Visits:  .  fluorouracil (ADRUCIL) 4,300 mg in sodium chloride 0.9 % 64 mL chemo infusion, 2,400 mg/m2 (Treatment Plan Recorded), Intravenous, 1 day or 1 dose, Sindy Guadeloupe, MD, 4,300 mg at 08/27/19 1345 .  heparin lock flush 100 unit/mL, 500 Units, Intracatheter, Once PRN, Sindy Guadeloupe, MD .  sodium chloride flush (NS) 0.9 % injection 10 mL, 10 mL, Intracatheter, PRN, Sindy Guadeloupe, MD, 10 mL at 08/15/19 1405  Physical exam:  Vitals:   08/27/19 0903  BP: 125/71  Pulse: 80  Resp: 16  Temp: 97.9 F (36.6 C)  TempSrc: Tympanic  Weight: 144 lb 1.6 oz (65.4 kg)  Height: _0  (1.702 m)   Physical Exam Constitutional:      General: He is not in acute distress. HENT:     Head: Normocephalic and atraumatic.  Eyes:     Pupils: Pupils are equal,  round, and reactive to light.  Neck:     Musculoskeletal: Normal range of motion.  Cardiovascular:     Rate and Rhythm: Normal rate and regular rhythm.     Heart sounds: Normal heart sounds.  Pulmonary:     Effort: Pulmonary effort is normal.     Breath sounds: Normal breath sounds.  Abdominal:     General: Bowel sounds are normal.     Palpations: Abdomen is soft.  Skin:    General: Skin is warm and dry.  Neurological:     Mental Status: He is alert and oriented to person, place, and time.      CMP Latest Ref Rng & Units 08/27/2019  Glucose 70 - 99 mg/dL 100(H)  BUN 8 - 23 mg/dL 20  Creatinine 0.61 - 1.24 mg/dL 0.76  Sodium 135 - 145 mmol/L 136  Potassium 3.5 - 5.1 mmol/L 3.7  Chloride 98 - 111 mmol/L 105  CO2 22 - 32 mmol/L 23  Calcium 8.9 - 10.3 mg/dL 9.1  Total Protein 6.5 - 8.1 g/dL 6.9  Total Bilirubin 0.3 - 1.2 mg/dL 0.7  Alkaline Phos 38 - 126 U/L 104  AST 15 - 41 U/L 34  ALT 0 - 44 U/L 31   CBC Latest Ref Rng & Units 08/27/2019  WBC 4.0 - 10.5 K/uL 6.4  Hemoglobin 13.0 - 17.0 g/dL 13.1  Hematocrit 39.0 - 52.0 % 38.6(L)  Platelets 150 - 400 K/uL 153      Assessment and plan- Patient is a 68 y.o. male with metastatic colon cancer TX NX M1 with generalized lymphadenopathy K-ras/beta wild type.   He is here for on treatment assessment prior to cycle 5 of FOLFOX Avastin chemotherapy  Counts are okay to proceed with cycle 5 of FOLFOX Avastin chemotherapy today.  His blood pressure is under good control and urine protein is trace.  I will see him back in 2 weeks time with CBC with differential, CMP for cycle 6.  I will plan to obtain scans after 6 cycles.  Plan is to continue FOLFOX Avastin chemotherapy until progression or toxicity   Visit Diagnosis 1. 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 ([place])   2. Encounter for antineoplastic chemotherapy      Dr. Randa Evens, MD, MPH [facility] [phone] 08/27/2019 4:30 PM

## 2019-08-27 NOTE — Progress Notes (Signed)
Pt doing well, no c/o. 

## 2019-08-27 NOTE — Progress Notes (Signed)
[place]  [phone] [phone]   Name: Eric Simon Date: 08/27/2019 MRN: [id number]  DOB: Jun 20, 1951  Patient Care Team: Jodi Marble, MD as PCP - General (Internal Medicine) Clent Jacks, RN as Oncology Nurse Navigator    REASON FOR CONSULTATION: Palliative Care consult requested for this 68 y.o. 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  Patient initiated treatment with FOLFOX Avastin chemotherapy.  He was referred to palliative care to discuss goals and manage ongoing symptoms.   SOCIAL HISTORY:     reports that he has been smoking cigarettes. He has a 20.00 pack-year smoking history. He has never used smokeless tobacco. He reports that he does not drink alcohol or use drugs.   Patient is divorced.  He has no children.  He lives at home and rents a room to a friend.  Patient is originally from [place] but has lived here for 30 years.  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  ADVANCE DIRECTIVES:  Living will but not on file  CODE STATUS:   PAST MEDICAL HISTORY: Past Medical History:  Diagnosis Date  . Colon cancer ([place])   . COPD (chronic obstructive pulmonary disease) ([place])   . Hyperlipidemia     PAST SURGICAL HISTORY:  Past Surgical History:  Procedure Laterality Date  . COLONOSCOPY WITH PROPOFOL N/A 06/26/2019   Procedure: COLONOSCOPY WITH PROPOFOL;  Surgeon: [name], MD;  [facility] ENDOSCOPY;  Service: Gastroenterology;  Laterality: N/A;  . PORTA CATH INSERTION N/A 06/25/2019   Procedure: PORTA CATH INSERTION;  Surgeon: Algernon Huxley, MD;  Location: [place] CV LAB;  Service: Cardiovascular;  Laterality: N/A;    HEMATOLOGY/ONCOLOGY HISTORY:  Oncology History  Colon cancer metastasized to  intra-abdominal lymph node ([place])  06/19/2019 Cancer Staging   Staging form: Colon and Rectum, AJCC 8th Edition - Clinical stage from 06/19/2019: Stage Unknown (cTX, cNX, pM1) - Signed by Sindy Guadeloupe, MD on 06/20/2019   06/20/2019 Initial Diagnosis   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 ([place])   07/02/2019 -  Chemotherapy   The patient had palonosetron (ALOXI) injection 0.25 mg, 0.25 mg, Intravenous,  Once, 5 of 5 cycles Administration: 0.25 mg (07/02/2019), 0.25 mg (07/16/2019), 0.25 mg (07/30/2019), 0.25 mg (08/13/2019) bevacizumab (AVASTIN) 350 mg in sodium chloride 0.9 % 100 mL chemo infusion, 5 mg/kg = 350 mg, Intravenous,  Once, 5 of 5 cycles Administration: 350 mg (07/02/2019), 350 mg (07/16/2019), 350 mg (07/30/2019), 350 mg (08/13/2019) leucovorin 700 mg in dextrose 5 % 250 mL infusion, 391 mg/m2 = 716 mg, Intravenous,  Once, 5 of 5 cycles Administration: 700 mg (07/02/2019), 700 mg (07/30/2019), 700 mg (08/13/2019) oxaliplatin (ELOXATIN) 150 mg in dextrose 5 % 500 mL chemo infusion, 85 mg/m2 = 150 mg, Intravenous,  Once, 5 of 5 cycles Dose modification: 65 mg/m2 (original dose 85 mg/m2, Cycle 3, Reason: Other (see comments), Comment: patient age and tolerance) Administration: 150 mg (07/02/2019), 150 mg (07/16/2019), 115 mg (07/30/2019), 115 mg (08/13/2019) fluorouracil (ADRUCIL) chemo injection 700 mg, 400 mg/m2 = 700 mg, Intravenous,  Once, 5 of 5 cycles Administration: 700 mg (07/02/2019), 700 mg (07/16/2019), 700 mg (07/30/2019), 700 mg (08/13/2019) fluorouracil (ADRUCIL) 4,300 mg in sodium chloride 0.9 % 64 mL chemo infusion, 2,400 mg/m2 = 4,300 mg, Intravenous, 1 Day/Dose, 5 of 5 cycles Administration: 4,300 mg (  07/02/2019), 4,300 mg (07/16/2019), 4,300 mg (07/30/2019), 4,300 mg (08/13/2019)  for chemotherapy treatment.      ALLERGIES:  has No Known Allergies.  MEDICATIONS:  Current Outpatient Medications  Medication Sig Dispense Refill  . aspirin EC 81 MG tablet Take 81 mg by mouth  daily.    . cetirizine (ZYRTEC) 10 MG tablet Take 1 tablet (10 mg total) by mouth daily. (Patient taking differently: Take 10 mg by mouth daily as needed. ) 30 tablet 0  . dexamethasone (DECADRON) 4 MG tablet Take 2 tablets (8 mg total) by mouth daily. Start the day after chemotherapy for 2 days. Take with food. 30 tablet 1  . fluticasone (VERAMYST) 27.5 MCG/SPRAY nasal spray Place 2 sprays into the nose daily.    [name] lidocaine-prilocaine (EMLA) cream Apply to affected area once 30 g 3  . magic mouthwash w/lidocaine SOLN Take 5-10 mLs by mouth 4 (four) times daily as needed for mouth pain. 480 mL 3  . montelukast (SINGULAIR) 10 MG tablet Take 10 mg by mouth at bedtime.    [name] nystatin (MYCOSTATIN) 100000 UNIT/ML suspension Take 5 mLs (500,000 Units total) by mouth 3 (three) times daily. Swish and Spit 60 mL 0  . ondansetron (ZOFRAN) 8 MG tablet Take 1 tablet (8 mg total) by mouth 2 (two) times daily as needed for refractory nausea / vomiting. Start on day 3 after chemotherapy. 30 tablet 1  . pantoprazole (PROTONIX) 40 MG tablet Take 40 mg by mouth daily.    . potassium chloride SA (K-DUR) 20 MEQ tablet Take 1 tablet (20 mEq total) by mouth daily. 14 tablet 0  . prochlorperazine (COMPAZINE) 10 MG tablet Take 1 tablet (10 mg total) by mouth every 6 (six) hours as needed (Nausea or vomiting). 30 tablet 1  . simvastatin (ZOCOR) 20 MG tablet Take 20 mg by mouth daily.    . tamsulosin (FLOMAX) 0.4 MG CAPS capsule Take 0.4 mg by mouth.    . traMADol-acetaminophen (ULTRACET) 37.5-325 MG tablet Take 1 tablet by mouth every 6 (six) hours.     Current Facility-Administered Medications  Medication Dose Route Frequency Provider Last Rate Last Dose  . fluorouracil (ADRUCIL) 4,300 mg in sodium chloride 0.9 % 64 mL chemo infusion  2,400 mg/m2 (Treatment Plan Recorded) Intravenous 1 day or 1 dose Sindy Guadeloupe, MD      . fluorouracil (ADRUCIL) chemo injection 700 mg  400 mg/m2 (Treatment Plan Recorded) Intravenous  Once Sindy Guadeloupe, MD      . heparin lock flush 100 unit/mL  500 Units Intracatheter Once PRN Sindy Guadeloupe, MD       Facility-Administered Medications Ordered in Other Visits  Medication Dose Route Frequency Provider Last Rate Last Dose  . sodium chloride flush (NS) 0.9 % injection 10 mL  10 mL Intracatheter PRN Sindy Guadeloupe, MD   10 mL at 08/15/19 1405    VITAL SIGNS: There were no vitals taken for this visit. There were no vitals filed for this visit.  Estimated body mass index is 22.57 kg/m as calculated from the following:   Height as of an earlier encounter on 08/27/19: 5\' 7"  (1.702 m).   Weight as of an earlier encounter on 08/27/19: 144 lb 1.6 oz (65.4 kg).  LABS: CBC:    Component Value Date/Time   WBC 6.4 08/27/2019 0840   HGB 13.1 08/27/2019 0840   HGB 16.1 08/18/2012 1321   HCT 38.6 (L) 08/27/2019 0840   HCT 45.4 08/18/2012 1321  PLT 153 08/27/2019 0840   PLT 192 08/18/2012 1321   MCV 89.1 08/27/2019 0840   MCV 88 08/18/2012 1321   NEUTROABS 3.6 08/27/2019 0840   LYMPHSABS 1.9 08/27/2019 0840   MONOABS 0.8 08/27/2019 0840   EOSABS 0.1 08/27/2019 0840   BASOSABS 0.0 08/27/2019 0840   Comprehensive Metabolic Panel:    Component Value Date/Time   NA 136 08/27/2019 0840   NA 139 08/18/2012 1321   K 3.7 08/27/2019 0840   K 3.7 08/18/2012 1321   CL 105 08/27/2019 0840   CL 104 08/18/2012 1321   CO2 23 08/27/2019 0840   CO2 27 08/18/2012 1321   BUN 20 08/27/2019 0840   BUN 18 08/18/2012 1321   CREATININE 0.76 08/27/2019 0840   CREATININE 0.82 08/18/2012 1321   GLUCOSE 100 (H) 08/27/2019 0840   GLUCOSE 68 08/18/2012 1321   CALCIUM 9.1 08/27/2019 0840   CALCIUM 9.0 08/18/2012 1321   AST 34 08/27/2019 0840   AST 36 08/18/2012 1321   ALT 31 08/27/2019 0840   ALT 51 08/18/2012 1321   ALKPHOS 104 08/27/2019 0840   ALKPHOS 88 08/18/2012 1321   BILITOT 0.7 08/27/2019 0840   BILITOT 0.4 08/18/2012 1321   PROT 6.9 08/27/2019 0840   PROT 8.0 08/18/2012  1321   ALBUMIN 3.5 08/27/2019 0840   ALBUMIN 4.1 08/18/2012 1321    RADIOGRAPHIC STUDIES: No results found.  PERFORMANCE STATUS (ECOG) : 0 - Asymptomatic  Review of Systems Unless otherwise noted, a complete review of systems is negative.  Physical Exam General: NAD, frail appearing, thin Pulmonary: Unlabored Extremities: no edema Skin: no rashes Neurological: Weakness but otherwise nonfocal  IMPRESSION: Routine follow-up visit today.  Patient was seen in the infusion area.  Patient reports doing reasonably well without any significant changes or concerns.  He denies any distressing symptoms today.  He reports adequate oral intake intake.  Weight appears stable at 144 pounds.  We discussed his emotional coping and he reports doing well.  He denies depression or anxiety.  Denies insomnia.  I have previously discussed ACP documents and MOST Form with patient.  Will readdress during a future visit.   PLAN: -Continue current scope of treatment -ACP documents to be brought in to be filed -Discussed MOST Form and will complete during future visit -RTC 4 weeks   Patient expressed understanding and was in agreement with this plan. He also understands that He can call the clinic at any time with any questions, concerns, or complaints.     Time Total: 15 minutes  Visit consisted of counseling and education dealing with the complex and emotionally intense issues of symptom management and palliative care in the setting of serious and potentially life-threatening illness.Greater than 50%  of this time was spent counseling and coordinating care related to the above assessment and plan.  Signed by: Altha Harm, PhD, NP-C [phone] (Work Cell)

## 2019-08-29 ENCOUNTER — Inpatient Hospital Stay: Payer: Medicare HMO

## 2019-08-29 ENCOUNTER — Other Ambulatory Visit: Payer: Self-pay

## 2019-08-29 DIAGNOSIS — C189 Malignant neoplasm of colon, unspecified: Secondary | ICD-10-CM

## 2019-08-29 DIAGNOSIS — Z5112 Encounter for antineoplastic immunotherapy: Secondary | ICD-10-CM | POA: Diagnosis not present

## 2019-08-29 DIAGNOSIS — C772 Secondary and unspecified malignant neoplasm of intra-abdominal lymph nodes: Secondary | ICD-10-CM

## 2019-08-29 MED ORDER — SODIUM CHLORIDE 0.9% FLUSH
10.0000 mL | INTRAVENOUS | Status: DC | PRN
  Administered 2019-08-29: 10 mL
  Filled 2019-08-29: qty 10

## 2019-08-29 MED ORDER — HEPARIN SOD (PORK) LOCK FLUSH 100 UNIT/ML IV SOLN
500.0000 [IU] | Freq: Once | INTRAVENOUS | Status: AC | PRN
  Administered 2019-08-29: 500 [IU]

## 2019-08-29 MED ORDER — HEPARIN SOD (PORK) LOCK FLUSH 100 UNIT/ML IV SOLN
INTRAVENOUS | Status: AC
  Filled 2019-08-29: qty 5

## 2019-09-03 ENCOUNTER — Other Ambulatory Visit: Payer: Self-pay | Admitting: Oncology

## 2019-09-10 ENCOUNTER — Inpatient Hospital Stay: Payer: Medicare HMO

## 2019-09-10 ENCOUNTER — Encounter: Payer: Self-pay | Admitting: Oncology

## 2019-09-10 ENCOUNTER — Other Ambulatory Visit: Payer: Self-pay

## 2019-09-10 ENCOUNTER — Inpatient Hospital Stay (HOSPITAL_BASED_OUTPATIENT_CLINIC_OR_DEPARTMENT_OTHER): Payer: Medicare HMO | Admitting: Oncology

## 2019-09-10 VITALS — BP 141/74 | HR 66 | Temp 97.9°F | Resp 16 | Ht 67.0 in | Wt 145.0 lb

## 2019-09-10 DIAGNOSIS — C772 Secondary and unspecified malignant neoplasm of intra-abdominal lymph nodes: Secondary | ICD-10-CM | POA: Diagnosis not present

## 2019-09-10 DIAGNOSIS — C189 Malignant neoplasm of colon, unspecified: Secondary | ICD-10-CM

## 2019-09-10 DIAGNOSIS — Z5112 Encounter for antineoplastic immunotherapy: Secondary | ICD-10-CM | POA: Diagnosis not present

## 2019-09-10 DIAGNOSIS — Z5111 Encounter for antineoplastic chemotherapy: Secondary | ICD-10-CM

## 2019-09-10 DIAGNOSIS — Z95828 Presence of other vascular implants and grafts: Secondary | ICD-10-CM

## 2019-09-10 LAB — COMPREHENSIVE METABOLIC PANEL
ALT: 27 U/L (ref 0–44)
AST: 30 U/L (ref 15–41)
Albumin: 3.4 g/dL — ABNORMAL LOW (ref 3.5–5.0)
Alkaline Phosphatase: 113 U/L (ref 38–126)
Anion gap: 10 (ref 5–15)
BUN: 21 mg/dL (ref 8–23)
CO2: 21 mmol/L — ABNORMAL LOW (ref 22–32)
Calcium: 8.6 mg/dL — ABNORMAL LOW (ref 8.9–10.3)
Chloride: 105 mmol/L (ref 98–111)
Creatinine, Ser: 0.75 mg/dL (ref 0.61–1.24)
GFR calc Af Amer: >60 mL/min (ref >60)
GFR calc non Af Amer: >60 mL/min (ref >60)
Glucose, Bld: 95 mg/dL (ref 70–99)
Potassium: 3.9 mmol/L (ref 3.5–5.1)
Sodium: 136 mmol/L (ref 135–145)
Total Bilirubin: 0.5 mg/dL (ref 0.3–1.2)
Total Protein: 6.9 g/dL (ref 6.5–8.1)

## 2019-09-10 LAB — CBC WITH DIFFERENTIAL/PLATELET
Abs Immature Granulocytes: 0.02 10*3/uL (ref 0.00–0.07)
Basophils Absolute: 0 10*3/uL (ref 0.0–0.1)
Basophils Relative: 1 %
Eosinophils Absolute: 0.1 10*3/uL (ref 0.0–0.5)
Eosinophils Relative: 2 %
HCT: 36.9 % — ABNORMAL LOW (ref 39.0–52.0)
Hemoglobin: 12.4 g/dL — ABNORMAL LOW (ref 13.0–17.0)
Immature Granulocytes: 0 %
Lymphocytes Relative: 34 %
Lymphs Abs: 1.8 10*3/uL (ref 0.7–4.0)
MCH: 30.8 pg (ref 26.0–34.0)
MCHC: 33.6 g/dL (ref 30.0–36.0)
MCV: 91.6 fL (ref 80.0–100.0)
Monocytes Absolute: 0.8 10*3/uL (ref 0.1–1.0)
Monocytes Relative: 14 %
Neutro Abs: 2.7 10*3/uL (ref 1.7–7.7)
Neutrophils Relative %: 49 %
Platelets: 141 10*3/uL — ABNORMAL LOW (ref 150–400)
RBC: 4.03 MIL/uL — ABNORMAL LOW (ref 4.22–5.81)
RDW: 17.6 % — ABNORMAL HIGH (ref 11.5–15.5)
WBC: 5.4 10*3/uL (ref 4.0–10.5)
nRBC: 0 % (ref 0.0–0.2)

## 2019-09-10 LAB — PROTEIN, URINE, RANDOM: Total Protein, Urine: 21 mg/dL

## 2019-09-10 MED ORDER — SODIUM CHLORIDE 0.9 % IV SOLN
2400.0000 mg/m2 | INTRAVENOUS | Status: DC
  Administered 2019-09-10: 4300 mg via INTRAVENOUS
  Filled 2019-09-10: qty 86

## 2019-09-10 MED ORDER — LEUCOVORIN CALCIUM INJECTION 350 MG
700.0000 mg | Freq: Once | INTRAVENOUS | Status: AC
  Administered 2019-09-10: 700 mg via INTRAVENOUS
  Filled 2019-09-10: qty 35

## 2019-09-10 MED ORDER — SODIUM CHLORIDE 0.9 % IV SOLN
Freq: Once | INTRAVENOUS | Status: AC
  Administered 2019-09-10: 11:00:00 via INTRAVENOUS
  Filled 2019-09-10: qty 250

## 2019-09-10 MED ORDER — DEXTROSE 5 % IV SOLN
Freq: Once | INTRAVENOUS | Status: AC
  Administered 2019-09-10: 12:00:00 via INTRAVENOUS
  Filled 2019-09-10: qty 250

## 2019-09-10 MED ORDER — SODIUM CHLORIDE 0.9 % IV SOLN
5.0000 mg/kg | Freq: Once | INTRAVENOUS | Status: AC
  Administered 2019-09-10: 350 mg via INTRAVENOUS
  Filled 2019-09-10: qty 14

## 2019-09-10 MED ORDER — DEXAMETHASONE SODIUM PHOSPHATE 10 MG/ML IJ SOLN
10.0000 mg | Freq: Once | INTRAMUSCULAR | Status: AC
  Administered 2019-09-10: 10 mg via INTRAVENOUS
  Filled 2019-09-10: qty 1

## 2019-09-10 MED ORDER — PALONOSETRON HCL INJECTION 0.25 MG/5ML
0.2500 mg | Freq: Once | INTRAVENOUS | Status: AC
  Administered 2019-09-10: 0.25 mg via INTRAVENOUS
  Filled 2019-09-10: qty 5

## 2019-09-10 MED ORDER — SODIUM CHLORIDE 0.9% FLUSH
10.0000 mL | Freq: Once | INTRAVENOUS | Status: AC
  Administered 2019-09-10: 10 mL via INTRAVENOUS
  Filled 2019-09-10: qty 10

## 2019-09-10 MED ORDER — FLUOROURACIL CHEMO INJECTION 2.5 GM/50ML
400.0000 mg/m2 | Freq: Once | INTRAVENOUS | Status: AC
  Administered 2019-09-10: 700 mg via INTRAVENOUS
  Filled 2019-09-10: qty 14

## 2019-09-10 MED ORDER — OXALIPLATIN CHEMO INJECTION 100 MG/20ML
65.0000 mg/m2 | Freq: Once | INTRAVENOUS | Status: AC
  Administered 2019-09-10: 115 mg via INTRAVENOUS
  Filled 2019-09-10: qty 20

## 2019-09-10 NOTE — Progress Notes (Signed)
Pt having some constipation. Rec: taking colace over the counter to help with BM.

## 2019-09-10 NOTE — Progress Notes (Signed)
Today's total protein, urine result is still pending. Total protein, urine result: 8 on 08/27/2019. Also, today's Potassium and Sodium results pending. MD, Dr. [name], notified, and aware. Per MD order: Total protein, urine result from 08/27/2019 can be referenced for today's treatment. Proceed with scheduled MVASI, FOLFOX treatment at this time.

## 2019-09-10 NOTE — Progress Notes (Signed)
Hematology/Oncology Consult note [facility]  [phone] [phone]  Patient Care Team: Jodi Marble, MD as PCP - General (Internal Medicine) Clent Jacks, RN as Oncology Nurse Navigator   Name of the patient: Eric Simon  [id number]  [birth year]   Date of visit: 09/10/19  Diagnosis- metastatic colon cancer with lymph node metastases K-ras/BRAF wild-type  Chief complaint/ Reason for visit-on treatment assessment prior to cycle 6 of FOLFOX Avastin chemotherapy  Heme/Onc history: Patient is a 68 yr old male with >40 pack year history of smoking. He currently smokes 0.5ppd. he presented to the ER with symptoms of left sided chest pain and left arm pain. Troponin was negative ekg was unremarkable. Ct chest showed no PE. He was incidentally noted to have mediastinal and retrocrural adenopathy and a 2.1X2.3X4.4 cm retroaortic soft tissue lesion in the posterior left chest. 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 Low-grade activity in the left lateral fifth and sixth ribs associated with nondisplaced fractures likely reflecting healing response problem malignancy.  Patient underwent CT-guided biopsy of the retroperitoneal lymph node pathology showed metastatic adenocarcinoma compatible with colorectal origin. CK7 negative. CK20 positive. CDX 2+. TTF-1 negative. PSA negative. This pattern of immunoreactivity supports the above diagnosis. Patient underwent colonoscopy which showed sigmoid mass that was consistent with adenocarcinoma. Rast panel testing showed that he was wild-type for both K-ras and BRAF  Interval history-patient has symptoms of chronic nasal congestion for which she has tried multiple over-the-counter remedies which have not helped him significantly.  Also reports intermittent tingling  numbness over his fingertips which last for a few days after chemotherapy and have resolved  ECOG PS- 1 Pain scale- 0   Review of systems- Review of Systems  Constitutional: Negative for chills, fever, malaise/fatigue and weight loss.  HENT: Negative for congestion, ear discharge and nosebleeds.   Eyes: Negative for blurred vision.  Respiratory: Negative for cough, hemoptysis, sputum production, shortness of breath and wheezing.   Cardiovascular: Negative for chest pain, palpitations, orthopnea and claudication.  Gastrointestinal: Negative for abdominal pain, blood in stool, constipation, diarrhea, heartburn, melena, nausea and vomiting.  Genitourinary: Negative for dysuria, flank pain, frequency, hematuria and urgency.  Musculoskeletal: Negative for back pain, joint pain and myalgias.  Skin: Negative for rash.  Neurological: Negative for dizziness, tingling, focal weakness, seizures, weakness and headaches.  Endo/Heme/Allergies: Does not bruise/bleed easily.  Psychiatric/Behavioral: Negative for depression and suicidal ideas. The patient does not have insomnia.       No Known Allergies   Past Medical History:  Diagnosis Date   Colon cancer ([place])    COPD (chronic obstructive pulmonary disease) ([place])    Hyperlipidemia      Past Surgical History:  Procedure Laterality Date   COLONOSCOPY WITH PROPOFOL N/A 06/26/2019   Procedure: COLONOSCOPY WITH PROPOFOL;  Surgeon: [name], MD;  Location: [facility] ENDOSCOPY;  Service: Gastroenterology;  Laterality: N/A;   PORTA CATH INSERTION N/A 06/25/2019   Procedure: PORTA CATH INSERTION;  Surgeon: Algernon Huxley, MD;  Location: [facility];  Service: Cardiovascular;  Laterality: N/A;    Social History   Socioeconomic History   Marital status: Single    Spouse name: Not on file   Number of children: Not on file   Years of education: Not on file   Highest education level: Not on file  Occupational History   Not on file  Social Designer, fashion/clothing strain: Not hard at all   Food insecurity    Worry: Never true    Inability: Never true   Transportation needs    Medical: No    Non-medical: No  Tobacco Use   Smoking status: Current Every Day Smoker    Packs/day: 0.50    Years: 40.00    Pack years: 20.00    Types: Cigarettes   Smokeless tobacco: Never Used   Tobacco comment: smoking 40 years  Substance and Sexual Activity   Alcohol use: No   Drug use: No   Sexual activity: Not Currently  Lifestyle   Physical activity    Days per week: Not on file    Minutes per session: Not on file   Stress: Not at all  Relationships   Social connections    Talks on phone: More than three times a week    Gets together: Not on file    Attends religious service: Not on file    Active member of club or organization: Not on file    Attends meetings of clubs or organizations: Not on file    Relationship status: Not on file   Intimate partner violence    Fear of current or ex partner: No    Emotionally abused: No    Physically abused: No    Forced sexual activity: No  Other Topics Concern   Not on file  Social History Narrative   Not on file    Family History  Problem Relation Age of Onset   Brain cancer Father      Current Outpatient Medications:    aspirin EC 81 MG tablet, Take 81 mg by mouth daily., Disp: , Rfl:    cetirizine (ZYRTEC) 10 MG tablet, Take 1 tablet (10 mg total) by mouth daily. (Patient taking differently: Take 10 mg by mouth daily as needed. ), Disp: 30 tablet, Rfl: 0   dexamethasone (DECADRON) 4 MG tablet, Take 2 tablets (8 mg total) by mouth daily. Start the day after chemotherapy for 2 days. Take with food., Disp: 30 tablet, Rfl: 1   fluticasone (VERAMYST) 27.5 MCG/SPRAY nasal spray, Place 2 sprays into the nose daily., Disp: , Rfl:    lidocaine-prilocaine (EMLA) cream, Apply to affected area once, Disp: 30 g, Rfl: 3   magic mouthwash w/lidocaine SOLN,  Take 5-10 mLs by mouth 4 (four) times daily as needed for mouth pain., Disp: 480 mL, Rfl: 3   montelukast (SINGULAIR) 10 MG tablet, Take 10 mg by mouth at bedtime., Disp: , Rfl:    nystatin (MYCOSTATIN) 100000 UNIT/ML suspension, Take 5 mLs (500,000 Units total) by mouth 3 (three) times daily. Swish and Spit, Disp: 60 mL, Rfl: 0   ondansetron (ZOFRAN) 8 MG tablet, Take 1 tablet (8 mg total) by mouth 2 (two) times daily as needed for refractory nausea / vomiting. Start on day 3 after chemotherapy., Disp: 30 tablet, Rfl: 1   pantoprazole (PROTONIX) 40 MG tablet, Take 40 mg by mouth daily., Disp: , Rfl:    potassium chloride SA (K-DUR) 20 MEQ tablet, Take 1 tablet (20 mEq total) by mouth daily., Disp: 14 tablet, Rfl: 0   prochlorperazine (COMPAZINE) 10 MG tablet, Take 1 tablet (10 mg total) by mouth every 6 (six) hours as needed (Nausea or vomiting)., Disp: 30 tablet, Rfl: 1   simvastatin (ZOCOR) 20 MG tablet, Take 20 mg by mouth daily., Disp: , Rfl:    tamsulosin (FLOMAX) 0.4 MG CAPS capsule, Take  0.4 mg by mouth., Disp: , Rfl:    traMADol-acetaminophen (ULTRACET) 37.5-325 MG tablet, Take 1 tablet by mouth every 6 (six) hours., Disp: , Rfl:  No current facility-administered medications for this visit.   Facility-Administered Medications Ordered in Other Visits:    sodium chloride flush (NS) 0.9 % injection 10 mL, 10 mL, Intracatheter, PRN, Sindy Guadeloupe, MD, 10 mL at 08/15/19 1405  Physical exam:  Vitals:   09/10/19 0940  BP: (!) 141/74  Pulse: 66  Resp: 16  Temp: 97.9 F (36.6 C)  TempSrc: Tympanic  Weight: 145 lb (65.8 kg)  Height: '5\' 7"'  (1.702 m)   Physical Exam HENT:     Head: Normocephalic and atraumatic.  Eyes:     Pupils: Pupils are equal, round, and reactive to light.  Neck:     Musculoskeletal: Normal range of motion.  Cardiovascular:     Rate and Rhythm: Normal rate and regular rhythm.     Heart sounds: Normal heart sounds.  Pulmonary:     Effort: Pulmonary  effort is normal.     Breath sounds: Normal breath sounds.  Abdominal:     General: Bowel sounds are normal.     Palpations: Abdomen is soft.  Skin:    General: Skin is warm and dry.  Neurological:     Mental Status: He is alert and oriented to person, place, and time.      CMP Latest Ref Rng & Units 08/27/2019  Glucose 70 - 99 mg/dL 100(H)  BUN 8 - 23 mg/dL 20  Creatinine 0.61 - 1.24 mg/dL 0.76  Sodium 135 - 145 mmol/L 136  Potassium 3.5 - 5.1 mmol/L 3.7  Chloride 98 - 111 mmol/L 105  CO2 22 - 32 mmol/L 23  Calcium 8.9 - 10.3 mg/dL 9.1  Total Protein 6.5 - 8.1 g/dL 6.9  Total Bilirubin 0.3 - 1.2 mg/dL 0.7  Alkaline Phos 38 - 126 U/L 104  AST 15 - 41 U/L 34  ALT 0 - 44 U/L 31   CBC Latest Ref Rng & Units 08/27/2019  WBC 4.0 - 10.5 K/uL 6.4  Hemoglobin 13.0 - 17.0 g/dL 13.1  Hematocrit 39.0 - 52.0 % 38.6(L)  Platelets 150 - 400 K/uL 153     Assessment and plan- Patient is a 68 y.o. male with metastatic colon cancer TX NX M1 with generalized lymphadenopathy K-ras/beta wild type.  He is here for on treatment assessment prior to cycle 6 of FOLFOX Avastin chemotherapy  Counts okay to proceed with cycle 6 of FOLFOX and Mvasi chemotherapy today.  He is tolerating chemotherapy well without any significant side effects.  No significant peripheral neuropathy.  Blood pressure is stable and urine protein remains trace to continue with the Avastin.  I will see him back in 2 weeks time with CBC with differential, CMP for cycle 7.  He will need CT chest abdomen pelvis with contrast prior   Visit Diagnosis 1. 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 ([place])   2. Encounter for antineoplastic chemotherapy      Dr. Randa Evens, MD, MPH [facility] at [facility] [phone] 09/10/2019 10:10 AM

## 2019-09-11 LAB — CEA: CEA: 4.6 ng/mL (ref 0.0–4.7)

## 2019-09-12 ENCOUNTER — Inpatient Hospital Stay: Payer: Medicare HMO

## 2019-09-12 ENCOUNTER — Other Ambulatory Visit: Payer: Self-pay

## 2019-09-12 VITALS — BP 129/77 | HR 78 | Temp 98.8°F | Resp 17

## 2019-09-12 DIAGNOSIS — C772 Secondary and unspecified malignant neoplasm of intra-abdominal lymph nodes: Secondary | ICD-10-CM

## 2019-09-12 DIAGNOSIS — Z5112 Encounter for antineoplastic immunotherapy: Secondary | ICD-10-CM | POA: Diagnosis not present

## 2019-09-12 DIAGNOSIS — C189 Malignant neoplasm of colon, unspecified: Secondary | ICD-10-CM

## 2019-09-12 MED ORDER — HEPARIN SOD (PORK) LOCK FLUSH 100 UNIT/ML IV SOLN
500.0000 [IU] | Freq: Once | INTRAVENOUS | Status: AC | PRN
  Administered 2019-09-12: 500 [IU]
  Filled 2019-09-12: qty 5

## 2019-09-17 ENCOUNTER — Ambulatory Visit
Admission: RE | Admit: 2019-09-17 | Discharge: 2019-09-17 | Disposition: A | Discharge status: Home / Self Care | Payer: Medicare HMO | Source: Ambulatory Visit | Attending: Oncology | Admitting: Oncology

## 2019-09-17 ENCOUNTER — Other Ambulatory Visit: Payer: Self-pay

## 2019-09-17 DIAGNOSIS — C772 Secondary and unspecified malignant neoplasm of intra-abdominal lymph nodes: Secondary | ICD-10-CM | POA: Insufficient documentation

## 2019-09-17 DIAGNOSIS — C189 Malignant neoplasm of colon, unspecified: Secondary | ICD-10-CM | POA: Diagnosis present

## 2019-09-17 MED ORDER — IOHEXOL 300 MG/ML  SOLN
100.0000 mL | Freq: Once | INTRAMUSCULAR | Status: AC | PRN
  Administered 2019-09-17: 09:00:00 100 mL via INTRAVENOUS

## 2019-09-24 ENCOUNTER — Inpatient Hospital Stay: Payer: Medicare HMO

## 2019-09-24 ENCOUNTER — Inpatient Hospital Stay: Payer: Medicare HMO | Attending: Hospice and Palliative Medicine | Admitting: Hospice and Palliative Medicine

## 2019-09-24 ENCOUNTER — Other Ambulatory Visit: Payer: Self-pay

## 2019-09-24 ENCOUNTER — Encounter: Payer: Self-pay | Admitting: Oncology

## 2019-09-24 ENCOUNTER — Inpatient Hospital Stay (HOSPITAL_BASED_OUTPATIENT_CLINIC_OR_DEPARTMENT_OTHER): Payer: Medicare HMO | Admitting: Oncology

## 2019-09-24 VITALS — BP 161/79 | HR 79 | Temp 97.8°F | Ht 67.0 in | Wt 145.0 lb

## 2019-09-24 DIAGNOSIS — Z515 Encounter for palliative care: Secondary | ICD-10-CM | POA: Diagnosis not present

## 2019-09-24 DIAGNOSIS — C786 Secondary malignant neoplasm of retroperitoneum and peritoneum: Secondary | ICD-10-CM | POA: Insufficient documentation

## 2019-09-24 DIAGNOSIS — Z79899 Other long term (current) drug therapy: Secondary | ICD-10-CM | POA: Insufficient documentation

## 2019-09-24 DIAGNOSIS — C772 Secondary and unspecified malignant neoplasm of intra-abdominal lymph nodes: Secondary | ICD-10-CM

## 2019-09-24 DIAGNOSIS — Z5111 Encounter for antineoplastic chemotherapy: Secondary | ICD-10-CM | POA: Diagnosis not present

## 2019-09-24 DIAGNOSIS — C189 Malignant neoplasm of colon, unspecified: Secondary | ICD-10-CM

## 2019-09-24 DIAGNOSIS — Z95828 Presence of other vascular implants and grafts: Secondary | ICD-10-CM

## 2019-09-24 DIAGNOSIS — Z7189 Other specified counseling: Secondary | ICD-10-CM

## 2019-09-24 LAB — COMPREHENSIVE METABOLIC PANEL
ALT: 36 U/L (ref 0–44)
AST: 40 U/L (ref 15–41)
Albumin: 3.5 g/dL (ref 3.5–5.0)
Alkaline Phosphatase: 100 U/L (ref 38–126)
Anion gap: 7 (ref 5–15)
BUN: 28 mg/dL — ABNORMAL HIGH (ref 8–23)
CO2: 22 mmol/L (ref 22–32)
Calcium: 8.9 mg/dL (ref 8.9–10.3)
Chloride: 106 mmol/L (ref 98–111)
Creatinine, Ser: 0.63 mg/dL (ref 0.61–1.24)
GFR calc Af Amer: >60 mL/min (ref >60)
GFR calc non Af Amer: >60 mL/min (ref >60)
Glucose, Bld: 97 mg/dL (ref 70–99)
Potassium: 3.7 mmol/L (ref 3.5–5.1)
Sodium: 135 mmol/L (ref 135–145)
Total Bilirubin: 0.6 mg/dL (ref 0.3–1.2)
Total Protein: 6.9 g/dL (ref 6.5–8.1)

## 2019-09-24 LAB — CBC WITH DIFFERENTIAL/PLATELET
Abs Immature Granulocytes: 0.01 10*3/uL (ref 0.00–0.07)
Basophils Absolute: 0 10*3/uL (ref 0.0–0.1)
Basophils Relative: 1 %
Eosinophils Absolute: 0.1 10*3/uL (ref 0.0–0.5)
Eosinophils Relative: 2 %
HCT: 37.3 % — ABNORMAL LOW (ref 39.0–52.0)
Hemoglobin: 12.6 g/dL — ABNORMAL LOW (ref 13.0–17.0)
Immature Granulocytes: 0 %
Lymphocytes Relative: 33 %
Lymphs Abs: 1.9 10*3/uL (ref 0.7–4.0)
MCH: 31.4 pg (ref 26.0–34.0)
MCHC: 33.8 g/dL (ref 30.0–36.0)
MCV: 93 fL (ref 80.0–100.0)
Monocytes Absolute: 0.8 10*3/uL (ref 0.1–1.0)
Monocytes Relative: 14 %
Neutro Abs: 2.8 10*3/uL (ref 1.7–7.7)
Neutrophils Relative %: 50 %
Platelets: 154 10*3/uL (ref 150–400)
RBC: 4.01 MIL/uL — ABNORMAL LOW (ref 4.22–5.81)
RDW: 18.1 % — ABNORMAL HIGH (ref 11.5–15.5)
WBC: 5.7 10*3/uL (ref 4.0–10.5)
nRBC: 0 % (ref 0.0–0.2)

## 2019-09-24 LAB — PROTEIN, URINE, RANDOM: Total Protein, Urine: 25 mg/dL

## 2019-09-24 MED ORDER — SODIUM CHLORIDE 0.9 % IV SOLN
5.0000 mg/kg | Freq: Once | INTRAVENOUS | Status: DC
  Filled 2019-09-24: qty 14

## 2019-09-24 MED ORDER — DEXTROSE 5 % IV SOLN
Freq: Once | INTRAVENOUS | Status: AC
  Administered 2019-09-24: 10:00:00 via INTRAVENOUS
  Filled 2019-09-24: qty 250

## 2019-09-24 MED ORDER — FLUOROURACIL CHEMO INJECTION 2.5 GM/50ML
400.0000 mg/m2 | Freq: Once | INTRAVENOUS | Status: AC
  Administered 2019-09-24: 700 mg via INTRAVENOUS
  Filled 2019-09-24: qty 14

## 2019-09-24 MED ORDER — SODIUM CHLORIDE 0.9 % IV SOLN
2400.0000 mg/m2 | INTRAVENOUS | Status: DC
  Administered 2019-09-24: 4300 mg via INTRAVENOUS
  Filled 2019-09-24: qty 86

## 2019-09-24 MED ORDER — SODIUM CHLORIDE 0.9% FLUSH
10.0000 mL | Freq: Once | INTRAVENOUS | Status: AC
  Administered 2019-09-24: 09:00:00 10 mL via INTRAVENOUS
  Filled 2019-09-24: qty 10

## 2019-09-24 MED ORDER — OXALIPLATIN CHEMO INJECTION 100 MG/20ML
65.0000 mg/m2 | Freq: Once | INTRAVENOUS | Status: AC
  Administered 2019-09-24: 11:00:00 115 mg via INTRAVENOUS
  Filled 2019-09-24: qty 20

## 2019-09-24 MED ORDER — DEXAMETHASONE SODIUM PHOSPHATE 10 MG/ML IJ SOLN
10.0000 mg | Freq: Once | INTRAMUSCULAR | Status: AC
  Administered 2019-09-24: 10 mg via INTRAVENOUS
  Filled 2019-09-24: qty 1

## 2019-09-24 MED ORDER — LEUCOVORIN CALCIUM INJECTION 350 MG
700.0000 mg | Freq: Once | INTRAVENOUS | Status: AC
  Administered 2019-09-24: 700 mg via INTRAVENOUS
  Filled 2019-09-24: qty 25

## 2019-09-24 MED ORDER — PALONOSETRON HCL INJECTION 0.25 MG/5ML
0.2500 mg | Freq: Once | INTRAVENOUS | Status: AC
  Administered 2019-09-24: 0.25 mg via INTRAVENOUS
  Filled 2019-09-24: qty 5

## 2019-09-24 NOTE — Progress Notes (Signed)
[place]  [phone] [phone]   Name: Eric Simon Date: 09/24/2019 MRN: [id number]  DOB: 11-13-1951  Patient Care Team: Jodi Marble, MD as PCP - General (Internal Medicine) Clent Jacks, RN as Oncology Nurse Navigator    REASON FOR CONSULTATION: Palliative Care consult requested for this 68 y.o. 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  Patient initiated treatment with FOLFOX Avastin chemotherapy.  He was referred to palliative care to discuss goals and manage ongoing symptoms.   SOCIAL HISTORY:     reports that he has been smoking cigarettes. He has a 20.00 pack-year smoking history. He has never used smokeless tobacco. He reports that he does not drink alcohol or use drugs.   Patient is divorced.  He has no children.  He lives at home and rents a room to a friend.  Patient is originally from [place] but has lived here for 30 years.  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  ADVANCE DIRECTIVES:  Living will but not on file  CODE STATUS: DNR/DNI (MOST Form completed on 09/24/2019)  PAST MEDICAL HISTORY: Past Medical History:  Diagnosis Date   Colon cancer ([place])    COPD (chronic obstructive pulmonary disease) ([place])    Hyperlipidemia     PAST SURGICAL HISTORY:  Past Surgical History:  Procedure Laterality Date   COLONOSCOPY WITH PROPOFOL N/A 06/26/2019   Procedure: COLONOSCOPY WITH PROPOFOL;  Surgeon: [name], MD;  Location: [facility] ENDOSCOPY;  Service: Gastroenterology;  Laterality: N/A;   PORTA CATH INSERTION N/A 06/25/2019   Procedure: PORTA CATH INSERTION;  Surgeon: Algernon Huxley, MD;  Location: [facility];  Service: Cardiovascular;  Laterality: N/A;    HEMATOLOGY/ONCOLOGY HISTORY:  Oncology  History  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 ([place])  06/19/2019 Cancer Staging   Staging form: Colon and Rectum, AJCC 8th Edition - Clinical stage from 06/19/2019: Stage Unknown (cTX, cNX, pM1) - Signed by Sindy Guadeloupe, MD on 06/20/2019   06/20/2019 Initial Diagnosis   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 ([place])   07/02/2019 -  Chemotherapy   The patient had palonosetron (ALOXI) injection 0.25 mg, 0.25 mg, Intravenous,  Once, 7 of 8 cycles Administration: 0.25 mg (07/02/2019), 0.25 mg (07/16/2019), 0.25 mg (07/30/2019), 0.25 mg (08/13/2019), 0.25 mg (08/27/2019), 0.25 mg (09/10/2019) bevacizumab (AVASTIN) 350 mg in sodium chloride 0.9 % 100 mL chemo infusion, 5 mg/kg = 350 mg, Intravenous,  Once, 5 of 5 cycles Administration: 350 mg (07/02/2019), 350 mg (07/16/2019), 350 mg (07/30/2019), 350 mg (08/13/2019), 350 mg (08/27/2019) leucovorin 700 mg in dextrose 5 % 250 mL infusion, 391 mg/m2 = 716 mg, Intravenous,  Once, 7 of 8 cycles Administration: 700 mg (07/02/2019), 700 mg (07/30/2019), 700 mg (08/13/2019), 700 mg (08/27/2019), 700 mg (09/10/2019) oxaliplatin (ELOXATIN) 150 mg in dextrose 5 % 500 mL chemo infusion, 85 mg/m2 = 150 mg, Intravenous,  Once, 7 of 8 cycles Dose modification: 65 mg/m2 (original dose 85 mg/m2, Cycle 3, Reason: Other (see comments), Comment: patient age and tolerance) Administration: 150 mg (07/02/2019), 150 mg (07/16/2019), 115 mg (07/30/2019), 115 mg (08/13/2019), 115 mg (08/27/2019), 115 mg (09/10/2019) fluorouracil (ADRUCIL) chemo injection 700 mg, 400 mg/m2 = 700 mg, Intravenous,  Once, 7 of 8 cycles Administration: 700 mg (07/02/2019), 700 mg (07/16/2019), 700 mg (07/30/2019), 700 mg (08/13/2019), 700 mg (  08/27/2019), 700 mg (09/10/2019) fluorouracil (ADRUCIL) 4,300 mg in sodium chloride 0.9 % 64 mL chemo infusion, 2,400 mg/m2 = 4,300 mg, Intravenous, 1 Day/Dose, 7 of 8 cycles Administration: 4,300 mg (07/02/2019), 4,300 mg (07/16/2019), 4,300 mg (07/30/2019), 4,300 mg  (08/13/2019), 4,300 mg (08/27/2019), 4,300 mg (09/10/2019) bevacizumab-awwb (MVASI) 350 mg in sodium chloride 0.9 % 100 mL chemo infusion, 5 mg/kg = 350 mg (100 % of original dose 5 mg/kg), Intravenous,  Once, 2 of 3 cycles Dose modification: 5 mg/kg (original dose 5 mg/kg, Cycle 6) Administration: 350 mg (09/10/2019)  for chemotherapy treatment.      ALLERGIES:  has No Known Allergies.  MEDICATIONS:  Current Outpatient Medications  Medication Sig Dispense Refill   aspirin EC 81 MG tablet Take 81 mg by mouth daily.     azelastine (ASTELIN) 0.1 % nasal spray Place 1 spray into both nostrils 1 day or 1 dose.     cetirizine (ZYRTEC) 10 MG tablet Take 1 tablet (10 mg total) by mouth daily. (Patient taking differently: Take 10 mg by mouth daily as needed. ) 30 tablet 0   dexamethasone (DECADRON) 4 MG tablet Take 2 tablets (8 mg total) by mouth daily. Start the day after chemotherapy for 2 days. Take with food. 30 tablet 1   fluticasone (VERAMYST) 27.5 MCG/SPRAY nasal spray Place 2 sprays into the nose daily.     lidocaine-prilocaine (EMLA) cream Apply to affected area once 30 g 3   magic mouthwash w/lidocaine SOLN Take 5-10 mLs by mouth 4 (four) times daily as needed for mouth pain. 480 mL 3   montelukast (SINGULAIR) 10 MG tablet Take 10 mg by mouth at bedtime.     nicotine (NICODERM CQ - DOSED IN MG/24 HOURS) 21 mg/24hr patch Place 1 patch onto the skin 1 day or 1 dose.     nystatin (MYCOSTATIN) 100000 UNIT/ML suspension Take 5 mLs (500,000 Units total) by mouth 3 (three) times daily. Swish and Spit 60 mL 0   ondansetron (ZOFRAN) 8 MG tablet Take 1 tablet (8 mg total) by mouth 2 (two) times daily as needed for refractory nausea / vomiting. Start on day 3 after chemotherapy. 30 tablet 1   pantoprazole (PROTONIX) 40 MG tablet Take 40 mg by mouth daily.     potassium chloride SA (K-DUR) 20 MEQ tablet Take 1 tablet (20 mEq total) by mouth daily. 14 tablet 0   prochlorperazine (COMPAZINE)  10 MG tablet Take 1 tablet (10 mg total) by mouth every 6 (six) hours as needed (Nausea or vomiting). 30 tablet 1   simvastatin (ZOCOR) 20 MG tablet Take 20 mg by mouth daily.     sucralfate (CARAFATE) 1 g tablet Take 1 g by mouth 4 (four) times daily.     tamsulosin (FLOMAX) 0.4 MG CAPS capsule Take 0.4 mg by mouth.     traMADol-acetaminophen (ULTRACET) 37.5-325 MG tablet Take 1 tablet by mouth every 6 (six) hours.     No current facility-administered medications for this visit.    Facility-Administered Medications Ordered in Other Visits  Medication Dose Route Frequency Provider Last Rate Last Dose   bevacizumab-awwb (MVASI) 350 mg in sodium chloride 0.9 % 100 mL chemo infusion  5 mg/kg (Treatment Plan Recorded) Intravenous Once Sindy Guadeloupe, MD       fluorouracil (ADRUCIL) 4,300 mg in sodium chloride 0.9 % 64 mL chemo infusion  2,400 mg/m2 (Treatment Plan Recorded) Intravenous 1 day or 1 dose Sindy Guadeloupe, MD       fluorouracil (  ADRUCIL) chemo injection 700 mg  400 mg/m2 (Treatment Plan Recorded) Intravenous Once Sindy Guadeloupe, MD       sodium chloride flush (NS) 0.9 % injection 10 mL  10 mL Intracatheter PRN Sindy Guadeloupe, MD   10 mL at 08/15/19 1405    VITAL SIGNS: There were no vitals taken for this visit. There were no vitals filed for this visit.  Estimated body mass index is 22.71 kg/m as calculated from the following:   Height as of an earlier encounter on 09/24/19: 5\' 7"  (1.702 m).   Weight as of an earlier encounter on 09/24/19: 145 lb (65.8 kg).  LABS: CBC:    Component Value Date/Time   WBC 5.7 09/24/2019 0853   HGB 12.6 (L) 09/24/2019 0853   HGB 16.1 08/18/2012 1321   HCT 37.3 (L) 09/24/2019 0853   HCT 45.4 08/18/2012 1321   PLT 154 09/24/2019 0853   PLT 192 08/18/2012 1321   MCV 93.0 09/24/2019 0853   MCV 88 08/18/2012 1321   NEUTROABS 2.8 09/24/2019 0853   LYMPHSABS 1.9 09/24/2019 0853   MONOABS 0.8 09/24/2019 0853   EOSABS 0.1 09/24/2019 0853    BASOSABS 0.0 09/24/2019 0853   Comprehensive Metabolic Panel:    Component Value Date/Time   Eric 135 09/24/2019 0853   Eric 139 08/18/2012 1321   K 3.7 09/24/2019 0853   K 3.7 08/18/2012 1321   CL 106 09/24/2019 0853   CL 104 08/18/2012 1321   CO2 22 09/24/2019 0853   CO2 27 08/18/2012 1321   BUN 28 (H) 09/24/2019 0853   BUN 18 08/18/2012 1321   CREATININE 0.63 09/24/2019 0853   CREATININE 0.82 08/18/2012 1321   GLUCOSE 97 09/24/2019 0853   GLUCOSE 68 08/18/2012 1321   CALCIUM 8.9 09/24/2019 0853   CALCIUM 9.0 08/18/2012 1321   AST 40 09/24/2019 0853   AST 36 08/18/2012 1321   ALT 36 09/24/2019 0853   ALT 51 08/18/2012 1321   ALKPHOS 100 09/24/2019 0853   ALKPHOS 88 08/18/2012 1321   BILITOT 0.6 09/24/2019 0853   BILITOT 0.4 08/18/2012 1321   PROT 6.9 09/24/2019 0853   PROT 8.0 08/18/2012 1321   ALBUMIN 3.5 09/24/2019 0853   ALBUMIN 4.1 08/18/2012 1321    RADIOGRAPHIC STUDIES: Ct Chest W Contrast  Result Date: 09/17/2019 CLINICAL DATA:  Restaging colon cancer, diagnosed 4 months ago EXAM: CT CHEST, ABDOMEN, AND PELVIS WITH CONTRAST TECHNIQUE: Multidetector CT imaging of the chest, abdomen and pelvis was performed following the standard protocol during bolus administration of intravenous contrast. CONTRAST:  142mL OMNIPAQUE IOHEXOL 300 MG/ML  SOLN COMPARISON:  PET-CT dated 06/06/2019.  CTA chest dated 05/24/2019. FINDINGS: CT CHEST FINDINGS Cardiovascular: The heart is normal in size. No pericardial effusion. No evidence of thoracic aortic aneurysm. Atherosclerotic calcifications of the aortic arch. Mild coronary atherosclerosis of the LAD. Right chest port terminates the cavoatrial junction. Mediastinum/Nodes: 9 mm short axis subcarinal node, previously 12 mm. No suspicious mediastinal lymphadenopathy. Visualized thyroid is unremarkable. Lungs/Pleura: Mild to moderate centrilobular and paraseptal emphysematous changes, upper lung predominant. Dependent atelectasis in the bilateral  lower lobes. No focal consolidation. No pleural effusion or pneumothorax. Musculoskeletal: Old left lateral 5th and 6th rib fracture deformities. Thoracic spine is within normal limits. CT ABDOMEN PELVIS FINDINGS Hepatobiliary: Liver is within normal limits. Gallbladder is unremarkable. No intrahepatic or extrahepatic ductal dilatation. Pancreas: Within normal limits. Spleen: Spleen is normal in size. Adrenals/Urinary Tract: Adrenal glands are within normal limits. Kidneys are within normal  limits.  No hydronephrosis. Bladder is within normal limits. Stomach/Bowel: Stomach is within normal limits. No evidence of bowel obstruction. Normal appendix (series 2/image 71). No colonic mass is seen in this patient with known colon cancer. Vascular/Lymphatic: No evidence of abdominal aortic aneurysm. Atherosclerotic calcifications of the abdominal aorta and branch vessels. Small abdominopelvic nodes, improved from PET, including: --6 mm short axis right retrocrural node (series 2/image 51), previously 12 mm --8 mm short axis aortocaval node (series 2/image 31), previously 20 mm --9 mm short axis left common iliac node (series 2/image 86), previously 16 mm --10 mm short axis left pelvic sidewall node (series 2/image 105), previously 19 mm Reproductive: Mild prostatomegaly. Other: No abdominopelvic ascites. Musculoskeletal: Mild degenerative changes of the lower lumbar spine. IMPRESSION: Improving lymphadenopathy in the chest, abdomen, and pelvis, as above. No colonic mass is seen in this patient with known colon cancer. Additional ancillary findings as above. Electronically Signed   By: Julian Hy M.D.   On: 09/17/2019 09:37   Ct Abdomen Pelvis W Contrast  Result Date: 09/17/2019 CLINICAL DATA:  Restaging colon cancer, diagnosed 4 months ago EXAM: CT CHEST, ABDOMEN, AND PELVIS WITH CONTRAST TECHNIQUE: Multidetector CT imaging of the chest, abdomen and pelvis was performed following the standard protocol during  bolus administration of intravenous contrast. CONTRAST:  176mL OMNIPAQUE IOHEXOL 300 MG/ML  SOLN COMPARISON:  PET-CT dated 06/06/2019.  CTA chest dated 05/24/2019. FINDINGS: CT CHEST FINDINGS Cardiovascular: The heart is normal in size. No pericardial effusion. No evidence of thoracic aortic aneurysm. Atherosclerotic calcifications of the aortic arch. Mild coronary atherosclerosis of the LAD. Right chest port terminates the cavoatrial junction. Mediastinum/Nodes: 9 mm short axis subcarinal node, previously 12 mm. No suspicious mediastinal lymphadenopathy. Visualized thyroid is unremarkable. Lungs/Pleura: Mild to moderate centrilobular and paraseptal emphysematous changes, upper lung predominant. Dependent atelectasis in the bilateral lower lobes. No focal consolidation. No pleural effusion or pneumothorax. Musculoskeletal: Old left lateral 5th and 6th rib fracture deformities. Thoracic spine is within normal limits. CT ABDOMEN PELVIS FINDINGS Hepatobiliary: Liver is within normal limits. Gallbladder is unremarkable. No intrahepatic or extrahepatic ductal dilatation. Pancreas: Within normal limits. Spleen: Spleen is normal in size. Adrenals/Urinary Tract: Adrenal glands are within normal limits. Kidneys are within normal limits.  No hydronephrosis. Bladder is within normal limits. Stomach/Bowel: Stomach is within normal limits. No evidence of bowel obstruction. Normal appendix (series 2/image 71). No colonic mass is seen in this patient with known colon cancer. Vascular/Lymphatic: No evidence of abdominal aortic aneurysm. Atherosclerotic calcifications of the abdominal aorta and branch vessels. Small abdominopelvic nodes, improved from PET, including: --6 mm short axis right retrocrural node (series 2/image 51), previously 12 mm --8 mm short axis aortocaval node (series 2/image 31), previously 20 mm --9 mm short axis left common iliac node (series 2/image 86), previously 16 mm --10 mm short axis left pelvic sidewall  node (series 2/image 105), previously 19 mm Reproductive: Mild prostatomegaly. Other: No abdominopelvic ascites. Musculoskeletal: Mild degenerative changes of the lower lumbar spine. IMPRESSION: Improving lymphadenopathy in the chest, abdomen, and pelvis, as above. No colonic mass is seen in this patient with known colon cancer. Additional ancillary findings as above. Electronically Signed   By: Julian Hy M.D.   On: 09/17/2019 09:37    PERFORMANCE STATUS (ECOG) : 0 - Asymptomatic  Review of Systems Unless otherwise noted, a complete review of systems is negative.  Physical Exam General: NAD, frail appearing, thin Pulmonary: Unlabored Extremities: no edema Skin: no rashes Neurological: Weakness but otherwise nonfocal  IMPRESSION: Routine follow-up visit today.  Patient was seen in the infusion area.  Patient reports doing well.  He denies any acute changes or concerns.  He is asymptomatic today.  He denies any recent distressing symptoms.  He reports good oral intake.  Weight appears stable 145 pounds.  Feels that he is coping well emotionally with his illness.  He feels he is also tolerating treatment without difficulty.  We did discuss CODE STATUS.  He says that he would not want to be resuscitated nor have his life prolonged artificially on machines.  He would want to be a DNR/DNI.  I completed a MOST form today. The patient outlined their wishes for the following treatment decisions:  Cardiopulmonary Resuscitation: Do Not Attempt Resuscitation (DNR/No CPR)  Medical Interventions: Limited Additional Interventions: Use medical treatment, IV fluids and cardiac monitoring as indicated, DO NOT USE intubation or mechanical ventilation. May consider use of less invasive airway support such as BiPAP or CPAP. Also provide comfort measures. Transfer to the hospital if indicated. Avoid intensive care.   Antibiotics: Antibiotics if indicated  IV Fluids: IV fluids if indicated  Feeding  Tube: No feeding tube    PLAN: -Continue current scope of treatment -DNR/DNI -MOST Form completed -RTC 4 weeks   Patient expressed understanding and was in agreement with this plan. He also understands that He can call the clinic at any time with any questions, concerns, or complaints.     Time Total: 15 minutes  Visit consisted of counseling and education dealing with the complex and emotionally intense issues of symptom management and palliative care in the setting of serious and potentially life-threatening illness.Greater than 50%  of this time was spent counseling and coordinating care related to the above assessment and plan.  Signed by: Altha Harm, PhD, NP-C [phone] (Work Cell)

## 2019-09-24 NOTE — Progress Notes (Signed)
Hematology/Oncology Consult note [facility]  Telephone:([phone] [phone]  Patient Care Team: Jodi Marble, MD as PCP - General (Internal Medicine) Clent Jacks, RN as Oncology Nurse Navigator   Name of the patient: Eric Simon  [id number]  January 31, 1951   Date of visit: 09/24/19  Diagnosis- metastatic colon cancer with lymph node metastases K-ras/BRAF wild-type  Chief complaint/ Reason for visit- on treatment assessment prior to cycle 7 of FOLFOX avastin chemotherapy  Heme/Onc history: Patient is a 68 yr old male with >40 pack year history of smoking. He currently smokes 0.5ppd. he presented to the ER with symptoms of left sided chest pain and left arm pain. Troponin was negative ekg was unremarkable. Ct chest showed no PE. He was incidentally noted to have mediastinal and retrocrural adenopathy and a 2.1X2.3X4.4 cm retroaortic soft tissue lesion in the posterior left chest. 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 Low-grade activity in the left lateral fifth and sixth ribs associated with nondisplaced fractures likely reflecting healing response problem malignancy.  Patient underwent CT-guided biopsy of the retroperitoneal lymph node pathology showed metastatic adenocarcinoma compatible with colorectal origin. CK7 negative. CK20 positive. CDX 2+. TTF-1 negative. PSA negative. This pattern of immunoreactivity supports the above diagnosis. Patient underwent colonoscopy which showed sigmoid mass that was consistent with adenocarcinoma. Rast panel testing showed that he was wild-type for both K-ras and BRAF   Interval history- he reports having intermittent tingling numbness in his extremities. Overall stable since last visit. Has chronic nasal congestion  ECOG PS- 1 Pain scale- 0   Review of systems-  Review of Systems  Constitutional: Negative for chills, fever, malaise/fatigue and weight loss.  HENT: Negative for congestion, ear discharge and nosebleeds.   Eyes: Negative for blurred vision.  Respiratory: Negative for cough, hemoptysis, sputum production, shortness of breath and wheezing.   Cardiovascular: Negative for chest pain, palpitations, orthopnea and claudication.  Gastrointestinal: Negative for abdominal pain, blood in stool, constipation, diarrhea, heartburn, melena, nausea and vomiting.  Genitourinary: Negative for dysuria, flank pain, frequency, hematuria and urgency.  Musculoskeletal: Negative for back pain, joint pain and myalgias.  Skin: Negative for rash.  Neurological: Negative for dizziness, tingling, focal weakness, seizures, weakness and headaches.  Endo/Heme/Allergies: Does not bruise/bleed easily.  Psychiatric/Behavioral: Negative for depression and suicidal ideas. The patient does not have insomnia.       No Known Allergies   Past Medical History:  Diagnosis Date   Colon cancer ([place])    COPD (chronic obstructive pulmonary disease) ([place])    Hyperlipidemia      Past Surgical History:  Procedure Laterality Date   COLONOSCOPY WITH PROPOFOL N/A 06/26/2019   Procedure: COLONOSCOPY WITH PROPOFOL;  Surgeon: [name], MD;  Location[facility] ENDOSCOPY;  Service: Gastroenterology;  Laterality: N/A;   PORTA CATH INSERTION N/A 06/25/2019   Procedure: PORTA CATH INSERTION;  Surgeon: Algernon Huxley, MD;  Location: [place] CV LAB;  Service: Cardiovascular;  Laterality: N/A;    Social History   Socioeconomic History   Marital status: Single    Spouse name: Not on file   Number of children: Not on file   Years of education: Not on file   Highest education level: Not on file  Occupational History   Not on file  Social Needs   Financial resource strain: Not hard at all   Food insecurity    Worry:  Never true    Inability: Never true    Transportation needs    Medical: No    Non-medical: No  Tobacco Use   Smoking status: Current Every Day Smoker    Packs/day: 0.50    Years: 40.00    Pack years: 20.00    Types: Cigarettes   Smokeless tobacco: Never Used   Tobacco comment: smoking 40 years  Substance and Sexual Activity   Alcohol use: No   Drug use: No   Sexual activity: Not Currently  Lifestyle   Physical activity    Days per week: Not on file    Minutes per session: Not on file   Stress: Not at all  Relationships   Social connections    Talks on phone: More than three times a week    Gets together: Not on file    Attends religious service: Not on file    Active member of club or organization: Not on file    Attends meetings of clubs or organizations: Not on file    Relationship status: Not on file   Intimate partner violence    Fear of current or ex partner: No    Emotionally abused: No    Physically abused: No    Forced sexual activity: No  Other Topics Concern   Not on file  Social History Narrative   Not on file    Family History  Problem Relation Age of Onset   Brain cancer Father      Current Outpatient Medications:    aspirin EC 81 MG tablet, Take 81 mg by mouth daily., Disp: , Rfl:    cetirizine (ZYRTEC) 10 MG tablet, Take 1 tablet (10 mg total) by mouth daily. (Patient taking differently: Take 10 mg by mouth daily as needed. ), Disp: 30 tablet, Rfl: 0   dexamethasone (DECADRON) 4 MG tablet, Take 2 tablets (8 mg total) by mouth daily. Start the day after chemotherapy for 2 days. Take with food., Disp: 30 tablet, Rfl: 1   fluticasone (VERAMYST) 27.5 MCG/SPRAY nasal spray, Place 2 sprays into the nose daily., Disp: , Rfl:    lidocaine-prilocaine (EMLA) cream, Apply to affected area once, Disp: 30 g, Rfl: 3   magic mouthwash w/lidocaine SOLN, Take 5-10 mLs by mouth 4 (four) times daily as needed for mouth pain., Disp: 480 mL, Rfl: 3   montelukast (SINGULAIR) 10 MG tablet,  Take 10 mg by mouth at bedtime., Disp: , Rfl:    nystatin (MYCOSTATIN) 100000 UNIT/ML suspension, Take 5 mLs (500,000 Units total) by mouth 3 (three) times daily. Swish and Spit, Disp: 60 mL, Rfl: 0   ondansetron (ZOFRAN) 8 MG tablet, Take 1 tablet (8 mg total) by mouth 2 (two) times daily as needed for refractory nausea / vomiting. Start on day 3 after chemotherapy., Disp: 30 tablet, Rfl: 1   pantoprazole (PROTONIX) 40 MG tablet, Take 40 mg by mouth daily., Disp: , Rfl:    potassium chloride SA (K-DUR) 20 MEQ tablet, Take 1 tablet (20 mEq total) by mouth daily., Disp: 14 tablet, Rfl: 0   prochlorperazine (COMPAZINE) 10 MG tablet, Take 1 tablet (10 mg total) by mouth every 6 (six) hours as needed (Nausea or vomiting)., Disp: 30 tablet, Rfl: 1   simvastatin (ZOCOR) 20 MG tablet, Take 20 mg by mouth daily., Disp: , Rfl:    tamsulosin (FLOMAX) 0.4 MG CAPS capsule, Take 0.4 mg by mouth., Disp: , Rfl:    traMADol-acetaminophen (ULTRACET) 37.5-325 MG tablet, Take 1 tablet  by mouth every 6 (six) hours., Disp: , Rfl:  No current facility-administered medications for this visit.   Facility-Administered Medications Ordered in Other Visits:    sodium chloride flush (NS) 0.9 % injection 10 mL, 10 mL, Intracatheter, PRN, Sindy Guadeloupe, MD, 10 mL at 08/15/19 1405  Physical exam:  Vitals:   09/24/19 0930  BP: (!) 161/79  Pulse: 79  Temp: 97.8 F (36.6 C)  TempSrc: Tympanic  Weight: 145 lb (65.8 kg)  Height: _0  (1.702 m)   Physical Exam HENT:     Head: Normocephalic and atraumatic.  Eyes:     Pupils: Pupils are equal, round, and reactive to light.  Neck:     Musculoskeletal: Normal range of motion.  Cardiovascular:     Rate and Rhythm: Normal rate and regular rhythm.     Heart sounds: Normal heart sounds.  Pulmonary:     Effort: Pulmonary effort is normal.     Breath sounds: Normal breath sounds.  Abdominal:     General: Bowel sounds are normal.     Palpations: Abdomen is soft.    Skin:    General: Skin is warm and dry.  Neurological:     Mental Status: He is alert and oriented to person, place, and time.      CMP Latest Ref Rng & Units 09/10/2019  Glucose 70 - 99 mg/dL 95  BUN 8 - 23 mg/dL 21  Creatinine 0.61 - 1.24 mg/dL 0.75  Sodium 135 - 145 mmol/L 136  Potassium 3.5 - 5.1 mmol/L 3.9  Chloride 98 - 111 mmol/L 105  CO2 22 - 32 mmol/L 21(L)  Calcium 8.9 - 10.3 mg/dL 8.6(L)  Total Protein 6.5 - 8.1 g/dL 6.9  Total Bilirubin 0.3 - 1.2 mg/dL 0.5  Alkaline Phos 38 - 126 U/L 113  AST 15 - 41 U/L 30  ALT 0 - 44 U/L 27   CBC Latest Ref Rng & Units 09/10/2019  WBC 4.0 - 10.5 K/uL 5.4  Hemoglobin 13.0 - 17.0 g/dL 12.4(L)  Hematocrit 39.0 - 52.0 % 36.9(L)  Platelets 150 - 400 K/uL 141(L)    No images are attached to the encounter.  Ct Chest W Contrast  Result Date: 09/17/2019 CLINICAL DATA:  Restaging colon cancer, diagnosed 4 months ago EXAM: CT CHEST, ABDOMEN, AND PELVIS WITH CONTRAST TECHNIQUE: Multidetector CT imaging of the chest, abdomen and pelvis was performed following the standard protocol during bolus administration of intravenous contrast. CONTRAST:  194m OMNIPAQUE IOHEXOL 300 MG/ML  SOLN COMPARISON:  PET-CT dated 06/06/2019.  CTA chest dated 05/24/2019. FINDINGS: CT CHEST FINDINGS Cardiovascular: The heart is normal in size. No pericardial effusion. No evidence of thoracic aortic aneurysm. Atherosclerotic calcifications of the aortic arch. Mild coronary atherosclerosis of the LAD. Right chest port terminates the cavoatrial junction. Mediastinum/Nodes: 9 mm short axis subcarinal node, previously 12 mm. No suspicious mediastinal lymphadenopathy. Visualized thyroid is unremarkable. Lungs/Pleura: Mild to moderate centrilobular and paraseptal emphysematous changes, upper lung predominant. Dependent atelectasis in the bilateral lower lobes. No focal consolidation. No pleural effusion or pneumothorax. Musculoskeletal: Old left lateral 5th and 6th rib fracture  deformities. Thoracic spine is within normal limits. CT ABDOMEN PELVIS FINDINGS Hepatobiliary: Liver is within normal limits. Gallbladder is unremarkable. No intrahepatic or extrahepatic ductal dilatation. Pancreas: Within normal limits. Spleen: Spleen is normal in size. Adrenals/Urinary Tract: Adrenal glands are within normal limits. Kidneys are within normal limits.  No hydronephrosis. Bladder is within normal limits. Stomach/Bowel: Stomach is within normal limits. No evidence of  bowel obstruction. Normal appendix (series 2/image 71). No colonic mass is seen in this patient with known colon cancer. Vascular/Lymphatic: No evidence of abdominal aortic aneurysm. Atherosclerotic calcifications of the abdominal aorta and branch vessels. Small abdominopelvic nodes, improved from PET, including: --6 mm short axis right retrocrural node (series 2/image 51), previously 12 mm --8 mm short axis aortocaval node (series 2/image 31), previously 20 mm --9 mm short axis left common iliac node (series 2/image 86), previously 16 mm --10 mm short axis left pelvic sidewall node (series 2/image 105), previously 19 mm Reproductive: Mild prostatomegaly. Other: No abdominopelvic ascites. Musculoskeletal: Mild degenerative changes of the lower lumbar spine. IMPRESSION: Improving lymphadenopathy in the chest, abdomen, and pelvis, as above. No colonic mass is seen in this patient with known colon cancer. Additional ancillary findings as above. Electronically Signed   By: Julian Hy M.D.   On: 09/17/2019 09:37   Ct Abdomen Pelvis W Contrast  Result Date: 09/17/2019 CLINICAL DATA:  Restaging colon cancer, diagnosed 4 months ago EXAM: CT CHEST, ABDOMEN, AND PELVIS WITH CONTRAST TECHNIQUE: Multidetector CT imaging of the chest, abdomen and pelvis was performed following the standard protocol during bolus administration of intravenous contrast. CONTRAST:  131m OMNIPAQUE IOHEXOL 300 MG/ML  SOLN COMPARISON:  PET-CT dated 06/06/2019.   CTA chest dated 05/24/2019. FINDINGS: CT CHEST FINDINGS Cardiovascular: The heart is normal in size. No pericardial effusion. No evidence of thoracic aortic aneurysm. Atherosclerotic calcifications of the aortic arch. Mild coronary atherosclerosis of the LAD. Right chest port terminates the cavoatrial junction. Mediastinum/Nodes: 9 mm short axis subcarinal node, previously 12 mm. No suspicious mediastinal lymphadenopathy. Visualized thyroid is unremarkable. Lungs/Pleura: Mild to moderate centrilobular and paraseptal emphysematous changes, upper lung predominant. Dependent atelectasis in the bilateral lower lobes. No focal consolidation. No pleural effusion or pneumothorax. Musculoskeletal: Old left lateral 5th and 6th rib fracture deformities. Thoracic spine is within normal limits. CT ABDOMEN PELVIS FINDINGS Hepatobiliary: Liver is within normal limits. Gallbladder is unremarkable. No intrahepatic or extrahepatic ductal dilatation. Pancreas: Within normal limits. Spleen: Spleen is normal in size. Adrenals/Urinary Tract: Adrenal glands are within normal limits. Kidneys are within normal limits.  No hydronephrosis. Bladder is within normal limits. Stomach/Bowel: Stomach is within normal limits. No evidence of bowel obstruction. Normal appendix (series 2/image 71). No colonic mass is seen in this patient with known colon cancer. Vascular/Lymphatic: No evidence of abdominal aortic aneurysm. Atherosclerotic calcifications of the abdominal aorta and branch vessels. Small abdominopelvic nodes, improved from PET, including: --6 mm short axis right retrocrural node (series 2/image 51), previously 12 mm --8 mm short axis aortocaval node (series 2/image 31), previously 20 mm --9 mm short axis left common iliac node (series 2/image 86), previously 16 mm --10 mm short axis left pelvic sidewall node (series 2/image 105), previously 19 mm Reproductive: Mild prostatomegaly. Other: No abdominopelvic ascites. Musculoskeletal: Mild  degenerative changes of the lower lumbar spine. IMPRESSION: Improving lymphadenopathy in the chest, abdomen, and pelvis, as above. No colonic mass is seen in this patient with known colon cancer. Additional ancillary findings as above. Electronically Signed   By: SJulian HyM.D.   On: 09/17/2019 09:37     Assessment and plan- Patient is a 68y.o. male with metastatic colon cancer TX NX M1 with generalized lymphadenopathy K-ras/beta wild type. He is here for on treatment assessment prior to cycle 7 of FOLFOX avastin chemotherapy.   Counts are ok to proceed with cycle 7 of FOLFOX chemotherapy today with pump d/c on day 3.  His BP is elevated at 161/79 today. I will touch base with Dr. Elijio Miles if he can be started on anti hypertensive meds since mvasi can cause HTN. If his repeat BP is <140/90- he can receive mvasi today.   I have reviewed ct chest abdomen pelvis images independently and discussed findings with the patient. Overall there has been a decrease in the size of lymphadenopathy which is his main site of disease. He will need chemo until progression or toxicity.  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 Will have a low threshold to stop oxaliplatin if it continues to worsen.   I will see him back in 2 weeks with cbc cmp cea and urine protein for cycle 8 of folfox mvasi chemotherapy   Visit Diagnosis 1. 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 ([place])   2. Encounter for antineoplastic chemotherapy      Dr. Randa Evens, MD, MPH [facility] [phone] 09/24/2019 12:14 PM

## 2019-09-24 NOTE — Progress Notes (Signed)
Per Dr [name], infuse Folfox first and if BP parameters are WDL, proceed with Mvasi. Will continue with tx and reassess BP after Folfox. After Folfox, BP 172/89, per Dr [name] hold Mvasi today, pharmacy and pt notified.

## 2019-09-24 NOTE — Progress Notes (Signed)
Patient stated that he had been doing well with no complaints. 

## 2019-09-26 ENCOUNTER — Inpatient Hospital Stay: Payer: Medicare HMO

## 2019-09-26 ENCOUNTER — Other Ambulatory Visit: Payer: Self-pay

## 2019-09-26 DIAGNOSIS — C801 Malignant (primary) neoplasm, unspecified: Secondary | ICD-10-CM

## 2019-09-26 DIAGNOSIS — Z5111 Encounter for antineoplastic chemotherapy: Secondary | ICD-10-CM | POA: Diagnosis not present

## 2019-09-26 MED ORDER — HEPARIN SOD (PORK) LOCK FLUSH 100 UNIT/ML IV SOLN
500.0000 [IU] | Freq: Once | INTRAVENOUS | Status: AC
  Administered 2019-09-26: 14:00:00 500 [IU] via INTRAVENOUS

## 2019-09-26 MED ORDER — SODIUM CHLORIDE 0.9% FLUSH
10.0000 mL | INTRAVENOUS | Status: DC | PRN
  Administered 2019-09-26: 10 mL via INTRAVENOUS
  Filled 2019-09-26: qty 10

## 2019-09-26 MED ORDER — HEPARIN SOD (PORK) LOCK FLUSH 100 UNIT/ML IV SOLN
INTRAVENOUS | Status: AC
  Filled 2019-09-26: qty 5

## 2019-10-03 ENCOUNTER — Ambulatory Visit: Payer: Medicare HMO

## 2019-10-08 ENCOUNTER — Ambulatory Visit: Payer: Medicare HMO | Admitting: Oncology

## 2019-10-08 ENCOUNTER — Ambulatory Visit: Payer: Medicare HMO

## 2019-10-08 ENCOUNTER — Other Ambulatory Visit: Payer: Medicare HMO

## 2019-10-12 ENCOUNTER — Encounter: Payer: Self-pay | Admitting: Oncology

## 2019-10-12 ENCOUNTER — Telehealth: Payer: Self-pay | Admitting: Pulmonary Disease

## 2019-10-12 ENCOUNTER — Other Ambulatory Visit: Payer: Self-pay

## 2019-10-12 NOTE — Progress Notes (Signed)
Patient stated that he had been doing well. Patient wanted to know if he could have surgery now that his cancer had shrunk.

## 2019-10-12 NOTE — Telephone Encounter (Signed)
Spoke to pt, who stated that he will not be able to do PFT on 10/16/2019, as he will be having chemo on 10/15/2019. Pt would like to r/s.   Rhonda, please advise. Thanks

## 2019-10-12 NOTE — Telephone Encounter (Signed)
Spoke with patient and he has been R/S for PFT on Thurs 11/22/2019 to arrive at 2:00 to [facility].  Appointment information and instructions mailed to patient.  Pt is aware of appointment. Nothing else needed at this time. Rhonda J Cobb

## 2019-10-15 ENCOUNTER — Inpatient Hospital Stay: Payer: Medicare HMO | Attending: Oncology

## 2019-10-15 ENCOUNTER — Other Ambulatory Visit: Payer: Self-pay

## 2019-10-15 ENCOUNTER — Inpatient Hospital Stay (HOSPITAL_BASED_OUTPATIENT_CLINIC_OR_DEPARTMENT_OTHER): Payer: Medicare HMO | Admitting: Oncology

## 2019-10-15 ENCOUNTER — Inpatient Hospital Stay: Payer: Medicare HMO

## 2019-10-15 VITALS — BP 129/83 | HR 86 | Temp 98.9°F | Resp 18 | Wt 143.9 lb

## 2019-10-15 VITALS — BP 124/64 | HR 66

## 2019-10-15 DIAGNOSIS — C772 Secondary and unspecified malignant neoplasm of intra-abdominal lymph nodes: Secondary | ICD-10-CM

## 2019-10-15 DIAGNOSIS — C189 Malignant neoplasm of colon, unspecified: Secondary | ICD-10-CM | POA: Insufficient documentation

## 2019-10-15 DIAGNOSIS — G620 Drug-induced polyneuropathy: Secondary | ICD-10-CM | POA: Diagnosis not present

## 2019-10-15 DIAGNOSIS — E876 Hypokalemia: Secondary | ICD-10-CM | POA: Insufficient documentation

## 2019-10-15 DIAGNOSIS — Z5111 Encounter for antineoplastic chemotherapy: Secondary | ICD-10-CM | POA: Insufficient documentation

## 2019-10-15 DIAGNOSIS — Z452 Encounter for adjustment and management of vascular access device: Secondary | ICD-10-CM | POA: Insufficient documentation

## 2019-10-15 DIAGNOSIS — Z5112 Encounter for antineoplastic immunotherapy: Secondary | ICD-10-CM | POA: Insufficient documentation

## 2019-10-15 DIAGNOSIS — F17210 Nicotine dependence, cigarettes, uncomplicated: Secondary | ICD-10-CM | POA: Insufficient documentation

## 2019-10-15 DIAGNOSIS — C786 Secondary malignant neoplasm of retroperitoneum and peritoneum: Secondary | ICD-10-CM | POA: Diagnosis not present

## 2019-10-15 DIAGNOSIS — D6959 Other secondary thrombocytopenia: Secondary | ICD-10-CM | POA: Insufficient documentation

## 2019-10-15 DIAGNOSIS — T451X5A Adverse effect of antineoplastic and immunosuppressive drugs, initial encounter: Secondary | ICD-10-CM

## 2019-10-15 LAB — COMPREHENSIVE METABOLIC PANEL
ALT: 39 U/L (ref 0–44)
AST: 42 U/L — ABNORMAL HIGH (ref 15–41)
Albumin: 3.6 g/dL (ref 3.5–5.0)
Alkaline Phosphatase: 113 U/L (ref 38–126)
Anion gap: 10 (ref 5–15)
BUN: 26 mg/dL — ABNORMAL HIGH (ref 8–23)
CO2: 22 mmol/L (ref 22–32)
Calcium: 8.9 mg/dL (ref 8.9–10.3)
Chloride: 106 mmol/L (ref 98–111)
Creatinine, Ser: 0.96 mg/dL (ref 0.61–1.24)
GFR calc Af Amer: >60 mL/min (ref >60)
GFR calc non Af Amer: >60 mL/min (ref >60)
Glucose, Bld: 95 mg/dL (ref 70–99)
Potassium: 3.9 mmol/L (ref 3.5–5.1)
Sodium: 138 mmol/L (ref 135–145)
Total Bilirubin: 0.6 mg/dL (ref 0.3–1.2)
Total Protein: 7 g/dL (ref 6.5–8.1)

## 2019-10-15 LAB — CBC WITH DIFFERENTIAL/PLATELET
Abs Immature Granulocytes: 0.02 10*3/uL (ref 0.00–0.07)
Basophils Absolute: 0 10*3/uL (ref 0.0–0.1)
Basophils Relative: 1 %
Eosinophils Absolute: 0.1 10*3/uL (ref 0.0–0.5)
Eosinophils Relative: 2 %
HCT: 40.3 % (ref 39.0–52.0)
Hemoglobin: 13.6 g/dL (ref 13.0–17.0)
Immature Granulocytes: 0 %
Lymphocytes Relative: 39 %
Lymphs Abs: 2.1 10*3/uL (ref 0.7–4.0)
MCH: 32.2 pg (ref 26.0–34.0)
MCHC: 33.7 g/dL (ref 30.0–36.0)
MCV: 95.5 fL (ref 80.0–100.0)
Monocytes Absolute: 0.9 10*3/uL (ref 0.1–1.0)
Monocytes Relative: 18 %
Neutro Abs: 2.1 10*3/uL (ref 1.7–7.7)
Neutrophils Relative %: 40 %
Platelets: 179 10*3/uL (ref 150–400)
RBC: 4.22 MIL/uL (ref 4.22–5.81)
RDW: 16.7 % — ABNORMAL HIGH (ref 11.5–15.5)
WBC: 5.2 10*3/uL (ref 4.0–10.5)
nRBC: 0 % (ref 0.0–0.2)

## 2019-10-15 LAB — PROTEIN, URINE, RANDOM: Total Protein, Urine: 22 mg/dL

## 2019-10-15 MED ORDER — PALONOSETRON HCL INJECTION 0.25 MG/5ML
0.2500 mg | Freq: Once | INTRAVENOUS | Status: AC
  Administered 2019-10-15: 11:00:00 0.25 mg via INTRAVENOUS
  Filled 2019-10-15: qty 5

## 2019-10-15 MED ORDER — SODIUM CHLORIDE 0.9 % IV SOLN
2400.0000 mg/m2 | INTRAVENOUS | Status: DC
  Administered 2019-10-15: 4300 mg via INTRAVENOUS
  Filled 2019-10-15: qty 86

## 2019-10-15 MED ORDER — DEXAMETHASONE SODIUM PHOSPHATE 10 MG/ML IJ SOLN
10.0000 mg | Freq: Once | INTRAMUSCULAR | Status: AC
  Administered 2019-10-15: 10 mg via INTRAVENOUS
  Filled 2019-10-15: qty 1

## 2019-10-15 MED ORDER — SODIUM CHLORIDE 0.9 % IV SOLN
5.0000 mg/kg | Freq: Once | INTRAVENOUS | Status: AC
  Administered 2019-10-15: 350 mg via INTRAVENOUS
  Filled 2019-10-15: qty 14

## 2019-10-15 MED ORDER — DEXTROSE 5 % IV SOLN
Freq: Once | INTRAVENOUS | Status: AC
  Administered 2019-10-15: 12:00:00 via INTRAVENOUS
  Filled 2019-10-15: qty 250

## 2019-10-15 MED ORDER — OXALIPLATIN CHEMO INJECTION 100 MG/20ML
65.0000 mg/m2 | Freq: Once | INTRAVENOUS | Status: AC
  Administered 2019-10-15: 115 mg via INTRAVENOUS
  Filled 2019-10-15: qty 20

## 2019-10-15 MED ORDER — LEUCOVORIN CALCIUM INJECTION 350 MG
700.0000 mg | Freq: Once | INTRAVENOUS | Status: AC
  Administered 2019-10-15: 700 mg via INTRAVENOUS
  Filled 2019-10-15: qty 35

## 2019-10-15 MED ORDER — FLUOROURACIL CHEMO INJECTION 2.5 GM/50ML
400.0000 mg/m2 | Freq: Once | INTRAVENOUS | Status: AC
  Administered 2019-10-15: 700 mg via INTRAVENOUS
  Filled 2019-10-15: qty 14

## 2019-10-15 MED ORDER — SODIUM CHLORIDE 0.9 % IV SOLN
Freq: Once | INTRAVENOUS | Status: AC
  Administered 2019-10-15: 11:00:00 via INTRAVENOUS
  Filled 2019-10-15: qty 250

## 2019-10-16 ENCOUNTER — Ambulatory Visit: Payer: Medicare HMO

## 2019-10-16 LAB — CEA: CEA: 5 ng/mL — ABNORMAL HIGH (ref 0.0–4.7)

## 2019-10-16 NOTE — Progress Notes (Signed)
Hematology/Oncology Consult note [facility]  Telephone:([phone] [phone]  Patient Care Team: Jodi Marble, MD as PCP - General (Internal Medicine) Clent Jacks, RN as Oncology Nurse Navigator   Name of the patient: Eric Simon  [id number]  1951-10-19   Date of visit: 10/16/19  Diagnosis- metastatic colon cancer with lymph node metastases K-ras/BRAF wild-type   Chief complaint/ Reason for visit-on treatment assessment prior to cycle 8 of FOLFOX Avastin chemotherapy  Heme/Onc history:  Patient is a 68 yr old male with >40 pack year history of smoking. He currently smokes 0.5ppd. he presented to the ER with symptoms of left sided chest pain and left arm pain. Troponin was negative ekg was unremarkable. Ct chest showed no PE. He was incidentally noted to have mediastinal and retrocrural adenopathy and a 2.1X2.3X4.4 cm retroaortic soft tissue lesion in the posterior left chest. 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 Low-grade activity in the left lateral fifth and sixth ribs associated with nondisplaced fractures likely reflecting healing response problem malignancy.  Patient underwent CT-guided biopsy of the retroperitoneal lymph node pathology showed metastatic adenocarcinoma compatible with colorectal origin. CK7 negative. CK20 positive. CDX 2+. TTF-1 negative. PSA negative. This pattern of immunoreactivity supports the above diagnosis. Patient underwent colonoscopy which showed sigmoid mass that was consistent with adenocarcinoma. Rast panel testing showed that he was wild-type for both K-ras and BRAF   Interval history-reports chronic nasal congestion but otherwise denies other complaints.  Energy levels are good and he denies any tingling numbness in his extremities.  ECOG PS- 1 Pain scale-  0 Opioid associated constipation- no  Review of systems- Review of Systems  Constitutional: Negative for chills, fever, malaise/fatigue and weight loss.  HENT: Positive for congestion. Negative for ear discharge and nosebleeds.   Eyes: Negative for blurred vision.  Respiratory: Negative for cough, hemoptysis, sputum production, shortness of breath and wheezing.   Cardiovascular: Negative for chest pain, palpitations, orthopnea and claudication.  Gastrointestinal: Negative for abdominal pain, blood in stool, constipation, diarrhea, heartburn, melena, nausea and vomiting.  Genitourinary: Negative for dysuria, flank pain, frequency, hematuria and urgency.  Musculoskeletal: Negative for back pain, joint pain and myalgias.  Skin: Negative for rash.  Neurological: Negative for dizziness, tingling, focal weakness, seizures, weakness and headaches.  Endo/Heme/Allergies: Does not bruise/bleed easily.  Psychiatric/Behavioral: Negative for depression and suicidal ideas. The patient does not have insomnia.       No Known Allergies   Past Medical History:  Diagnosis Date   Colon cancer ([place])    COPD (chronic obstructive pulmonary disease) ([place])    Hyperlipidemia      Past Surgical History:  Procedure Laterality Date   COLONOSCOPY WITH PROPOFOL N/A 06/26/2019   Procedure: COLONOSCOPY WITH PROPOFOL;  Surgeon: [name], MD;  Location: [facility] ENDOSCOPY;  Service: Gastroenterology;  Laterality: N/A;   PORTA CATH INSERTION N/A 06/25/2019   Procedure: PORTA CATH INSERTION;  Surgeon: Algernon Huxley, MD;  Location: [facility];  Service: Cardiovascular;  Laterality: N/A;    Social History   Socioeconomic History   Marital status: Single    Spouse name: Not on file   Number of children: Not on file   Years of education: Not on file   Highest education level: Not on file  Occupational History   Not on file  Social Needs   Financial resource strain: Not hard  at all   Food  insecurity    Worry: Never true    Inability: Never true   Transportation needs    Medical: No    Non-medical: No  Tobacco Use   Smoking status: Current Every Day Smoker    Packs/day: 0.50    Years: 40.00    Pack years: 20.00    Types: Cigarettes   Smokeless tobacco: Never Used   Tobacco comment: smoking 40 years  Substance and Sexual Activity   Alcohol use: No   Drug use: No   Sexual activity: Not Currently  Lifestyle   Physical activity    Days per week: Not on file    Minutes per session: Not on file   Stress: Not at all  Relationships   Social connections    Talks on phone: More than three times a week    Gets together: Not on file    Attends religious service: Not on file    Active member of club or organization: Not on file    Attends meetings of clubs or organizations: Not on file    Relationship status: Not on file   Intimate partner violence    Fear of current or ex partner: No    Emotionally abused: No    Physically abused: No    Forced sexual activity: No  Other Topics Concern   Not on file  Social History Narrative   Not on file    Family History  Problem Relation Age of Onset   Brain cancer Father      Current Outpatient Medications:    aspirin EC 81 MG tablet, Take 81 mg by mouth daily., Disp: , Rfl:    azelastine (ASTELIN) 0.1 % nasal spray, Place 1 spray into both nostrils 1 day or 1 dose., Disp: , Rfl:    cetirizine (ZYRTEC) 10 MG tablet, Take 1 tablet (10 mg total) by mouth daily. (Patient taking differently: Take 10 mg by mouth daily as needed. ), Disp: 30 tablet, Rfl: 0   dexamethasone (DECADRON) 4 MG tablet, Take 2 tablets (8 mg total) by mouth daily. Start the day after chemotherapy for 2 days. Take with food., Disp: 30 tablet, Rfl: 1   fluticasone (VERAMYST) 27.5 MCG/SPRAY nasal spray, Place 2 sprays into the nose daily., Disp: , Rfl:    lidocaine-prilocaine (EMLA) cream, Apply to affected area once, Disp: 30 g, Rfl:  3   magic mouthwash w/lidocaine SOLN, Take 5-10 mLs by mouth 4 (four) times daily as needed for mouth pain., Disp: 480 mL, Rfl: 3   montelukast (SINGULAIR) 10 MG tablet, Take 10 mg by mouth at bedtime., Disp: , Rfl:    nicotine (NICODERM CQ - DOSED IN MG/24 HOURS) 21 mg/24hr patch, Place 1 patch onto the skin 1 day or 1 dose., Disp: , Rfl:    nystatin (MYCOSTATIN) 100000 UNIT/ML suspension, Take 5 mLs (500,000 Units total) by mouth 3 (three) times daily. Swish and Spit, Disp: 60 mL, Rfl: 0   ondansetron (ZOFRAN) 8 MG tablet, Take 1 tablet (8 mg total) by mouth 2 (two) times daily as needed for refractory nausea / vomiting. Start on day 3 after chemotherapy., Disp: 30 tablet, Rfl: 1   pantoprazole (PROTONIX) 40 MG tablet, Take 40 mg by mouth daily., Disp: , Rfl:    potassium chloride SA (K-DUR) 20 MEQ tablet, Take 1 tablet (20 mEq total) by mouth daily., Disp: 14 tablet, Rfl: 0   prochlorperazine (COMPAZINE) 10 MG tablet, Take 1 tablet (10 mg  total) by mouth every 6 (six) hours as needed (Nausea or vomiting)., Disp: 30 tablet, Rfl: 1   simvastatin (ZOCOR) 20 MG tablet, Take 20 mg by mouth daily., Disp: , Rfl:    sucralfate (CARAFATE) 1 g tablet, Take 1 g by mouth 4 (four) times daily., Disp: , Rfl:    tamsulosin (FLOMAX) 0.4 MG CAPS capsule, Take 0.4 mg by mouth., Disp: , Rfl:    traMADol-acetaminophen (ULTRACET) 37.5-325 MG tablet, Take 1 tablet by mouth every 6 (six) hours., Disp: , Rfl:  No current facility-administered medications for this visit.   Facility-Administered Medications Ordered in Other Visits:    sodium chloride flush (NS) 0.9 % injection 10 mL, 10 mL, Intracatheter, PRN, Sindy Guadeloupe, MD, 10 mL at 08/15/19 1405  Physical exam:  Vitals:   10/15/19 0924  BP: 129/83  Pulse: 86  Resp: 18  Temp: 98.9 F (37.2 C)  Weight: 143 lb 14.4 oz (65.3 kg)   Physical Exam Constitutional:      General: He is not in acute distress. HENT:     Head: Normocephalic and  atraumatic.  Eyes:     Pupils: Pupils are equal, round, and reactive to light.  Neck:     Musculoskeletal: Normal range of motion.  Cardiovascular:     Rate and Rhythm: Normal rate and regular rhythm.     Heart sounds: Normal heart sounds.  Pulmonary:     Effort: Pulmonary effort is normal.     Breath sounds: Normal breath sounds.  Abdominal:     General: Bowel sounds are normal.     Palpations: Abdomen is soft.  Skin:    General: Skin is warm and dry.  Neurological:     Mental Status: He is alert and oriented to person, place, and time.      CMP Latest Ref Rng & Units 10/15/2019  Glucose 70 - 99 mg/dL 95  BUN 8 - 23 mg/dL 26(H)  Creatinine 0.61 - 1.24 mg/dL 0.96  Sodium 135 - 145 mmol/L 138  Potassium 3.5 - 5.1 mmol/L 3.9  Chloride 98 - 111 mmol/L 106  CO2 22 - 32 mmol/L 22  Calcium 8.9 - 10.3 mg/dL 8.9  Total Protein 6.5 - 8.1 g/dL 7.0  Total Bilirubin 0.3 - 1.2 mg/dL 0.6  Alkaline Phos 38 - 126 U/L 113  AST 15 - 41 U/L 42(H)  ALT 0 - 44 U/L 39   CBC Latest Ref Rng & Units 10/15/2019  WBC 4.0 - 10.5 K/uL 5.2  Hemoglobin 13.0 - 17.0 g/dL 13.6  Hematocrit 39.0 - 52.0 % 40.3  Platelets 150 - 400 K/uL 179    No images are attached to the encounter.  Ct Chest W Contrast  Result Date: 09/17/2019 CLINICAL DATA:  Restaging colon cancer, diagnosed 4 months ago EXAM: CT CHEST, ABDOMEN, AND PELVIS WITH CONTRAST TECHNIQUE: Multidetector CT imaging of the chest, abdomen and pelvis was performed following the standard protocol during bolus administration of intravenous contrast. CONTRAST:  121m OMNIPAQUE IOHEXOL 300 MG/ML  SOLN COMPARISON:  PET-CT dated 06/06/2019.  CTA chest dated 05/24/2019. FINDINGS: CT CHEST FINDINGS Cardiovascular: The heart is normal in size. No pericardial effusion. No evidence of thoracic aortic aneurysm. Atherosclerotic calcifications of the aortic arch. Mild coronary atherosclerosis of the LAD. Right chest port terminates the cavoatrial junction.  Mediastinum/Nodes: 9 mm short axis subcarinal node, previously 12 mm. No suspicious mediastinal lymphadenopathy. Visualized thyroid is unremarkable. Lungs/Pleura: Mild to moderate centrilobular and paraseptal emphysematous changes, upper lung predominant. Dependent atelectasis  in the bilateral lower lobes. No focal consolidation. No pleural effusion or pneumothorax. Musculoskeletal: Old left lateral 5th and 6th rib fracture deformities. Thoracic spine is within normal limits. CT ABDOMEN PELVIS FINDINGS Hepatobiliary: Liver is within normal limits. Gallbladder is unremarkable. No intrahepatic or extrahepatic ductal dilatation. Pancreas: Within normal limits. Spleen: Spleen is normal in size. Adrenals/Urinary Tract: Adrenal glands are within normal limits. Kidneys are within normal limits.  No hydronephrosis. Bladder is within normal limits. Stomach/Bowel: Stomach is within normal limits. No evidence of bowel obstruction. Normal appendix (series 2/image 71). No colonic mass is seen in this patient with known colon cancer. Vascular/Lymphatic: No evidence of abdominal aortic aneurysm. Atherosclerotic calcifications of the abdominal aorta and branch vessels. Small abdominopelvic nodes, improved from PET, including: --6 mm short axis right retrocrural node (series 2/image 51), previously 12 mm --8 mm short axis aortocaval node (series 2/image 31), previously 20 mm --9 mm short axis left common iliac node (series 2/image 86), previously 16 mm --10 mm short axis left pelvic sidewall node (series 2/image 105), previously 19 mm Reproductive: Mild prostatomegaly. Other: No abdominopelvic ascites. Musculoskeletal: Mild degenerative changes of the lower lumbar spine. IMPRESSION: Improving lymphadenopathy in the chest, abdomen, and pelvis, as above. No colonic mass is seen in this patient with known colon cancer. Additional ancillary findings as above. Electronically Signed   By: Julian Hy M.D.   On: 09/17/2019 09:37    Ct Abdomen Pelvis W Contrast  Result Date: 09/17/2019 CLINICAL DATA:  Restaging colon cancer, diagnosed 4 months ago EXAM: CT CHEST, ABDOMEN, AND PELVIS WITH CONTRAST TECHNIQUE: Multidetector CT imaging of the chest, abdomen and pelvis was performed following the standard protocol during bolus administration of intravenous contrast. CONTRAST:  153m OMNIPAQUE IOHEXOL 300 MG/ML  SOLN COMPARISON:  PET-CT dated 06/06/2019.  CTA chest dated 05/24/2019. FINDINGS: CT CHEST FINDINGS Cardiovascular: The heart is normal in size. No pericardial effusion. No evidence of thoracic aortic aneurysm. Atherosclerotic calcifications of the aortic arch. Mild coronary atherosclerosis of the LAD. Right chest port terminates the cavoatrial junction. Mediastinum/Nodes: 9 mm short axis subcarinal node, previously 12 mm. No suspicious mediastinal lymphadenopathy. Visualized thyroid is unremarkable. Lungs/Pleura: Mild to moderate centrilobular and paraseptal emphysematous changes, upper lung predominant. Dependent atelectasis in the bilateral lower lobes. No focal consolidation. No pleural effusion or pneumothorax. Musculoskeletal: Old left lateral 5th and 6th rib fracture deformities. Thoracic spine is within normal limits. CT ABDOMEN PELVIS FINDINGS Hepatobiliary: Liver is within normal limits. Gallbladder is unremarkable. No intrahepatic or extrahepatic ductal dilatation. Pancreas: Within normal limits. Spleen: Spleen is normal in size. Adrenals/Urinary Tract: Adrenal glands are within normal limits. Kidneys are within normal limits.  No hydronephrosis. Bladder is within normal limits. Stomach/Bowel: Stomach is within normal limits. No evidence of bowel obstruction. Normal appendix (series 2/image 71). No colonic mass is seen in this patient with known colon cancer. Vascular/Lymphatic: No evidence of abdominal aortic aneurysm. Atherosclerotic calcifications of the abdominal aorta and branch vessels. Small abdominopelvic nodes,  improved from PET, including: --6 mm short axis right retrocrural node (series 2/image 51), previously 12 mm --8 mm short axis aortocaval node (series 2/image 31), previously 20 mm --9 mm short axis left common iliac node (series 2/image 86), previously 16 mm --10 mm short axis left pelvic sidewall node (series 2/image 105), previously 19 mm Reproductive: Mild prostatomegaly. Other: No abdominopelvic ascites. Musculoskeletal: Mild degenerative changes of the lower lumbar spine. IMPRESSION: Improving lymphadenopathy in the chest, abdomen, and pelvis, as above. No colonic mass is seen in  this patient with known colon cancer. Additional ancillary findings as above. Electronically Signed   By: Julian Hy M.D.   On: 09/17/2019 09:37     Assessment and plan- Patient is a 68 y.o. male  with metastatic colon cancer TX NX M1 with generalized lymphadenopathy K-ras/beta wild type.  He is here for on treatment assessment prior to cycle 8 of FOLFOX Avastin chemotherapy  Counts okay to proceed with cycle 8 of FOLFOX Avastin chemotherapy today.  Pump DC on day 3.  His blood pressure is better controlled today and his urine protein remains trace.  I will see him back in 2 weeks time with CBC with differential, CMP for cycle 9  Chemo-induced peripheral neuropathy: Reports that he gets occasional tingling in his fingers which last for a few days after chemotherapy but does not persist.  Continue to monitor.  I would like to give him oxaliplatin for 12 cycles if possible before holding it   Visit Diagnosis 1. Encounter for antineoplastic chemotherapy   2. 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 ([place])      Dr. Randa Evens, MD, MPH [facility] [phone] 10/16/2019 9:02 AM

## 2019-10-17 ENCOUNTER — Other Ambulatory Visit: Payer: Self-pay

## 2019-10-17 ENCOUNTER — Inpatient Hospital Stay: Payer: Medicare HMO

## 2019-10-17 DIAGNOSIS — C772 Secondary and unspecified malignant neoplasm of intra-abdominal lymph nodes: Secondary | ICD-10-CM

## 2019-10-17 DIAGNOSIS — C189 Malignant neoplasm of colon, unspecified: Secondary | ICD-10-CM

## 2019-10-17 DIAGNOSIS — Z5112 Encounter for antineoplastic immunotherapy: Secondary | ICD-10-CM | POA: Diagnosis not present

## 2019-10-17 MED ORDER — SODIUM CHLORIDE 0.9% FLUSH
10.0000 mL | INTRAVENOUS | Status: DC | PRN
  Administered 2019-10-17: 10 mL via INTRAVENOUS
  Filled 2019-10-17: qty 10

## 2019-10-17 MED ORDER — HEPARIN SOD (PORK) LOCK FLUSH 100 UNIT/ML IV SOLN
500.0000 [IU] | Freq: Once | INTRAVENOUS | Status: AC
  Administered 2019-10-17: 500 [IU] via INTRAVENOUS

## 2019-10-17 MED ORDER — HEPARIN SOD (PORK) LOCK FLUSH 100 UNIT/ML IV SOLN
INTRAVENOUS | Status: AC
  Filled 2019-10-17: qty 5

## 2019-10-22 ENCOUNTER — Inpatient Hospital Stay: Payer: Medicare HMO | Admitting: Hospice and Palliative Medicine

## 2019-10-24 ENCOUNTER — Other Ambulatory Visit: Payer: Self-pay | Admitting: Oncology

## 2019-10-28 ENCOUNTER — Other Ambulatory Visit: Payer: Self-pay | Admitting: *Deleted

## 2019-10-28 DIAGNOSIS — C772 Secondary and unspecified malignant neoplasm of intra-abdominal lymph nodes: Secondary | ICD-10-CM

## 2019-10-28 DIAGNOSIS — C189 Malignant neoplasm of colon, unspecified: Secondary | ICD-10-CM

## 2019-10-29 ENCOUNTER — Inpatient Hospital Stay: Payer: Medicare HMO

## 2019-10-29 ENCOUNTER — Inpatient Hospital Stay (HOSPITAL_BASED_OUTPATIENT_CLINIC_OR_DEPARTMENT_OTHER): Payer: Medicare HMO | Admitting: Hospice and Palliative Medicine

## 2019-10-29 ENCOUNTER — Other Ambulatory Visit: Payer: Self-pay

## 2019-10-29 ENCOUNTER — Inpatient Hospital Stay (HOSPITAL_BASED_OUTPATIENT_CLINIC_OR_DEPARTMENT_OTHER): Payer: Medicare HMO | Admitting: Oncology

## 2019-10-29 ENCOUNTER — Encounter: Payer: Self-pay | Admitting: Oncology

## 2019-10-29 VITALS — BP 127/71 | HR 83 | Temp 97.1°F | Wt 145.0 lb

## 2019-10-29 VITALS — BP 123/65 | HR 62

## 2019-10-29 DIAGNOSIS — Z5112 Encounter for antineoplastic immunotherapy: Secondary | ICD-10-CM | POA: Diagnosis not present

## 2019-10-29 DIAGNOSIS — D6959 Other secondary thrombocytopenia: Secondary | ICD-10-CM | POA: Diagnosis not present

## 2019-10-29 DIAGNOSIS — E876 Hypokalemia: Secondary | ICD-10-CM | POA: Diagnosis not present

## 2019-10-29 DIAGNOSIS — C772 Secondary and unspecified malignant neoplasm of intra-abdominal lymph nodes: Secondary | ICD-10-CM

## 2019-10-29 DIAGNOSIS — Z515 Encounter for palliative care: Secondary | ICD-10-CM | POA: Diagnosis not present

## 2019-10-29 DIAGNOSIS — C189 Malignant neoplasm of colon, unspecified: Secondary | ICD-10-CM

## 2019-10-29 DIAGNOSIS — T451X5A Adverse effect of antineoplastic and immunosuppressive drugs, initial encounter: Secondary | ICD-10-CM

## 2019-10-29 DIAGNOSIS — Z5111 Encounter for antineoplastic chemotherapy: Secondary | ICD-10-CM

## 2019-10-29 LAB — COMPREHENSIVE METABOLIC PANEL
ALT: 32 U/L (ref 0–44)
AST: 41 U/L (ref 15–41)
Albumin: 3.4 g/dL — ABNORMAL LOW (ref 3.5–5.0)
Alkaline Phosphatase: 104 U/L (ref 38–126)
Anion gap: 7 (ref 5–15)
BUN: 20 mg/dL (ref 8–23)
CO2: 21 mmol/L — ABNORMAL LOW (ref 22–32)
Calcium: 8.5 mg/dL — ABNORMAL LOW (ref 8.9–10.3)
Chloride: 106 mmol/L (ref 98–111)
Creatinine, Ser: 0.61 mg/dL (ref 0.61–1.24)
GFR calc Af Amer: >60 mL/min (ref >60)
GFR calc non Af Amer: >60 mL/min (ref >60)
Glucose, Bld: 154 mg/dL — ABNORMAL HIGH (ref 70–99)
Potassium: 3.3 mmol/L — ABNORMAL LOW (ref 3.5–5.1)
Sodium: 134 mmol/L — ABNORMAL LOW (ref 135–145)
Total Bilirubin: 0.7 mg/dL (ref 0.3–1.2)
Total Protein: 6.7 g/dL (ref 6.5–8.1)

## 2019-10-29 LAB — CBC WITH DIFFERENTIAL/PLATELET
Abs Immature Granulocytes: 0.01 10*3/uL (ref 0.00–0.07)
Basophils Absolute: 0 10*3/uL (ref 0.0–0.1)
Basophils Relative: 1 %
Eosinophils Absolute: 0.1 10*3/uL (ref 0.0–0.5)
Eosinophils Relative: 2 %
HCT: 38.7 % — ABNORMAL LOW (ref 39.0–52.0)
Hemoglobin: 12.9 g/dL — ABNORMAL LOW (ref 13.0–17.0)
Immature Granulocytes: 0 %
Lymphocytes Relative: 37 %
Lymphs Abs: 1.8 10*3/uL (ref 0.7–4.0)
MCH: 31.9 pg (ref 26.0–34.0)
MCHC: 33.3 g/dL (ref 30.0–36.0)
MCV: 95.8 fL (ref 80.0–100.0)
Monocytes Absolute: 0.4 10*3/uL (ref 0.1–1.0)
Monocytes Relative: 9 %
Neutro Abs: 2.4 10*3/uL (ref 1.7–7.7)
Neutrophils Relative %: 51 %
Platelets: 135 10*3/uL — ABNORMAL LOW (ref 150–400)
RBC: 4.04 MIL/uL — ABNORMAL LOW (ref 4.22–5.81)
RDW: 15.3 % (ref 11.5–15.5)
WBC: 4.7 10*3/uL (ref 4.0–10.5)
nRBC: 0 % (ref 0.0–0.2)

## 2019-10-29 LAB — PROTEIN, URINE, RANDOM: Total Protein, Urine: 9 mg/dL

## 2019-10-29 MED ORDER — SODIUM CHLORIDE 0.9% FLUSH
10.0000 mL | Freq: Once | INTRAVENOUS | Status: AC
  Administered 2019-10-29: 10 mL via INTRAVENOUS
  Filled 2019-10-29: qty 10

## 2019-10-29 MED ORDER — FLUOROURACIL CHEMO INJECTION 2.5 GM/50ML
400.0000 mg/m2 | Freq: Once | INTRAVENOUS | Status: AC
  Administered 2019-10-29: 700 mg via INTRAVENOUS
  Filled 2019-10-29: qty 14

## 2019-10-29 MED ORDER — SODIUM CHLORIDE 0.9 % IV SOLN
2400.0000 mg/m2 | INTRAVENOUS | Status: DC
  Administered 2019-10-29: 4300 mg via INTRAVENOUS
  Filled 2019-10-29: qty 86

## 2019-10-29 MED ORDER — SODIUM CHLORIDE 0.9 % IV SOLN
5.0000 mg/kg | Freq: Once | INTRAVENOUS | Status: AC
  Administered 2019-10-29: 350 mg via INTRAVENOUS
  Filled 2019-10-29: qty 14

## 2019-10-29 MED ORDER — DEXAMETHASONE SODIUM PHOSPHATE 10 MG/ML IJ SOLN
10.0000 mg | Freq: Once | INTRAMUSCULAR | Status: AC
  Administered 2019-10-29: 10 mg via INTRAVENOUS
  Filled 2019-10-29: qty 1

## 2019-10-29 MED ORDER — DEXTROSE 5 % IV SOLN
Freq: Once | INTRAVENOUS | Status: AC
  Administered 2019-10-29: 11:00:00 via INTRAVENOUS
  Filled 2019-10-29: qty 250

## 2019-10-29 MED ORDER — OXALIPLATIN CHEMO INJECTION 100 MG/20ML
65.0000 mg/m2 | Freq: Once | INTRAVENOUS | Status: AC
  Administered 2019-10-29: 115 mg via INTRAVENOUS
  Filled 2019-10-29: qty 20

## 2019-10-29 MED ORDER — SODIUM CHLORIDE 0.9 % IV SOLN
Freq: Once | INTRAVENOUS | Status: AC
  Administered 2019-10-29: 10:00:00 via INTRAVENOUS
  Filled 2019-10-29: qty 250

## 2019-10-29 MED ORDER — PALONOSETRON HCL INJECTION 0.25 MG/5ML
0.2500 mg | Freq: Once | INTRAVENOUS | Status: AC
  Administered 2019-10-29: 0.25 mg via INTRAVENOUS
  Filled 2019-10-29: qty 5

## 2019-10-29 MED ORDER — HEPARIN SOD (PORK) LOCK FLUSH 100 UNIT/ML IV SOLN
500.0000 [IU] | Freq: Once | INTRAVENOUS | Status: DC
  Filled 2019-10-29: qty 5

## 2019-10-29 MED ORDER — LEUCOVORIN CALCIUM INJECTION 350 MG
700.0000 mg | Freq: Once | INTRAVENOUS | Status: AC
  Administered 2019-10-29: 700 mg via INTRAVENOUS
  Filled 2019-10-29: qty 35

## 2019-10-29 NOTE — Progress Notes (Signed)
[place]  Telephone:([phone] [phone]   Name: Eric Simon Date: 10/29/2019 MRN: [id number]  DOB: 09-14-1951  Patient Care Team: Jodi Marble, MD as PCP - General (Internal Medicine) Clent Jacks, RN as Oncology Nurse Navigator    REASON FOR CONSULTATION: Palliative Care consult requested for this 68 y.o. 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  Patient initiated treatment with FOLFOX Avastin chemotherapy.  He was referred to palliative care to discuss goals and manage ongoing symptoms.   SOCIAL HISTORY:     reports that he has been smoking cigarettes. He has a 20.00 pack-year smoking history. He has never used smokeless tobacco. He reports that he does not drink alcohol or use drugs.   Patient is divorced.  He has no children.  He lives at home and rents a room to a friend.  Patient is originally from [place] but has lived here for 30 years.  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  ADVANCE DIRECTIVES:  Living will but not on file  CODE STATUS: DNR/DNI (MOST Form completed on 09/24/2019)  PAST MEDICAL HISTORY: Past Medical History:  Diagnosis Date  . Colon cancer ([place])   . COPD (chronic obstructive pulmonary disease) ([place])   . Hyperlipidemia     PAST SURGICAL HISTORY:  Past Surgical History:  Procedure Laterality Date  . COLONOSCOPY WITH PROPOFOL N/A 06/26/2019   Procedure: COLONOSCOPY WITH PROPOFOL;  Surgeon: [name], MD;  Location: [facility];  Service: Gastroenterology;  Laterality: N/A;  . PORTA CATH INSERTION N/A 06/25/2019   Procedure: PORTA CATH INSERTION;  Surgeon: Algernon Huxley, MD;  Location: [facility];  Service: Cardiovascular;  Laterality: N/A;    HEMATOLOGY/ONCOLOGY HISTORY:  Oncology  History  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 ([place])  06/19/2019 Cancer Staging   Staging form: Colon and Rectum, AJCC 8th Edition - Clinical stage from 06/19/2019: Stage Unknown (cTX, cNX, pM1) - Signed by Sindy Guadeloupe, MD on 06/20/2019   06/20/2019 Initial Diagnosis   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 ([place])   07/02/2019 -  Chemotherapy   The patient had palonosetron (ALOXI) injection 0.25 mg, 0.25 mg, Intravenous,  Once, 9 of 15 cycles Administration: 0.25 mg (07/02/2019), 0.25 mg (07/16/2019), 0.25 mg (07/30/2019), 0.25 mg (08/13/2019), 0.25 mg (08/27/2019), 0.25 mg (09/10/2019), 0.25 mg (09/24/2019), 0.25 mg (10/15/2019), 0.25 mg (10/29/2019) bevacizumab (AVASTIN) 350 mg in sodium chloride 0.9 % 100 mL chemo infusion, 5 mg/kg = 350 mg, Intravenous,  Once, 5 of 5 cycles Administration: 350 mg (07/02/2019), 350 mg (07/16/2019), 350 mg (07/30/2019), 350 mg (08/13/2019), 350 mg (08/27/2019) leucovorin 700 mg in dextrose 5 % 250 mL infusion, 391 mg/m2 = 716 mg, Intravenous,  Once, 9 of 15 cycles Administration: 700 mg (07/02/2019), 700 mg (07/30/2019), 700 mg (08/13/2019), 700 mg (08/27/2019), 700 mg (09/10/2019), 700 mg (09/24/2019), 700 mg (10/15/2019), 700 mg (10/29/2019) oxaliplatin (ELOXATIN) 150 mg in dextrose 5 % 500 mL chemo infusion, 85 mg/m2 = 150 mg, Intravenous,  Once, 9 of 15 cycles Dose modification: 65 mg/m2 (original dose 85 mg/m2, Cycle 3, Reason: Other (see comments), Comment: patient age and tolerance) Administration: 150 mg (07/02/2019), 150 mg (07/16/2019), 115 mg (07/30/2019), 115 mg (08/13/2019), 115 mg (08/27/2019), 115 mg (09/10/2019), 115 mg (09/24/2019), 115 mg (10/15/2019), 115 mg (10/29/2019) fluorouracil (ADRUCIL) chemo injection 700 mg,  400 mg/m2 = 700 mg, Intravenous,  Once, 9 of 15 cycles Administration: 700 mg (07/02/2019), 700 mg (07/16/2019), 700 mg (07/30/2019), 700 mg (08/13/2019), 700 mg (08/27/2019), 700 mg (09/10/2019), 700 mg (09/24/2019), 700 mg  (10/15/2019), 700 mg (10/29/2019) fluorouracil (ADRUCIL) 4,300 mg in sodium chloride 0.9 % 64 mL chemo infusion, 2,400 mg/m2 = 4,300 mg, Intravenous, 1 Day/Dose, 9 of 15 cycles Administration: 4,300 mg (07/02/2019), 4,300 mg (07/16/2019), 4,300 mg (07/30/2019), 4,300 mg (08/13/2019), 4,300 mg (08/27/2019), 4,300 mg (09/10/2019), 4,300 mg (09/24/2019), 4,300 mg (10/15/2019), 4,300 mg (10/29/2019) bevacizumab-awwb (MVASI) 350 mg in sodium chloride 0.9 % 100 mL chemo infusion, 5 mg/kg = 350 mg (100 % of original dose 5 mg/kg), Intravenous,  Once, 4 of 10 cycles Dose modification: 5 mg/kg (original dose 5 mg/kg, Cycle 6) Administration: 350 mg (09/10/2019), 350 mg (10/15/2019), 350 mg (10/29/2019)  for chemotherapy treatment.      ALLERGIES:  has No Known Allergies.  MEDICATIONS:  Current Outpatient Medications  Medication Sig Dispense Refill  . aspirin EC 81 MG tablet Take 81 mg by mouth daily.    [name] azelastine (ASTELIN) 0.1 % nasal spray Place 1 spray into both nostrils 1 day or 1 dose.    . cetirizine (ZYRTEC) 10 MG tablet Take 1 tablet (10 mg total) by mouth daily. (Patient taking differently: Take 10 mg by mouth daily as needed. ) 30 tablet 0  . dexamethasone (DECADRON) 4 MG tablet Take 2 tablets (8 mg total) by mouth daily. Start the day after chemotherapy for 2 days. Take with food. 30 tablet 1  . fluticasone (VERAMYST) 27.5 MCG/SPRAY nasal spray Place 2 sprays into the nose daily.    [name] lidocaine-prilocaine (EMLA) cream Apply to affected area once 30 g 3  . magic mouthwash w/lidocaine SOLN Take 5-10 mLs by mouth 4 (four) times daily as needed for mouth pain. 480 mL 3  . montelukast (SINGULAIR) 10 MG tablet Take 10 mg by mouth at bedtime.    . nicotine (NICODERM CQ - DOSED IN MG/24 HOURS) 21 mg/24hr patch Place 1 patch onto the skin 1 day or 1 dose.    . nystatin (MYCOSTATIN) 100000 UNIT/ML suspension Take 5 mLs (500,000 Units total) by mouth 3 (three) times daily. Swish and Spit 60 mL 0  .  ondansetron (ZOFRAN) 8 MG tablet Take 1 tablet (8 mg total) by mouth 2 (two) times daily as needed for refractory nausea / vomiting. Start on day 3 after chemotherapy. 30 tablet 1  . pantoprazole (PROTONIX) 40 MG tablet Take 40 mg by mouth daily.    . potassium chloride SA (K-DUR) 20 MEQ tablet Take 1 tablet (20 mEq total) by mouth daily. 14 tablet 0  . prochlorperazine (COMPAZINE) 10 MG tablet Take 1 tablet (10 mg total) by mouth every 6 (six) hours as needed (Nausea or vomiting). 30 tablet 1  . simvastatin (ZOCOR) 20 MG tablet Take 20 mg by mouth daily.    . sucralfate (CARAFATE) 1 g tablet Take 1 g by mouth 4 (four) times daily.    . tamsulosin (FLOMAX) 0.4 MG CAPS capsule Take 0.4 mg by mouth.    . traMADol-acetaminophen (ULTRACET) 37.5-325 MG tablet Take 1 tablet by mouth every 6 (six) hours.     No current facility-administered medications for this visit.    Facility-Administered Medications Ordered in Other Visits  Medication Dose Route Frequency Provider Last Rate Last Dose  . sodium chloride flush (NS) 0.9 % injection 10 mL  10 mL Intracatheter PRN Randa Evens  C, MD   10 mL at 08/15/19 1405    VITAL SIGNS: There were no vitals taken for this visit. There were no vitals filed for this visit.  Estimated body mass index is 22.71 kg/m as calculated from the following:   Height as of 09/24/19: 5\' 7"  (1.702 m).   Weight as of an earlier encounter on 10/29/19: 145 lb (65.8 kg).  LABS: CBC:    Component Value Date/Time   WBC 4.7 10/29/2019 0841   HGB 12.9 (L) 10/29/2019 0841   HGB 16.1 08/18/2012 1321   HCT 38.7 (L) 10/29/2019 0841   HCT 45.4 08/18/2012 1321   PLT 135 (L) 10/29/2019 0841   PLT 192 08/18/2012 1321   MCV 95.8 10/29/2019 0841   MCV 88 08/18/2012 1321   NEUTROABS 2.4 10/29/2019 0841   LYMPHSABS 1.8 10/29/2019 0841   MONOABS 0.4 10/29/2019 0841   EOSABS 0.1 10/29/2019 0841   BASOSABS 0.0 10/29/2019 0841   Comprehensive Metabolic Panel:    Component Value  Date/Time   NA 134 (L) 10/29/2019 0841   NA 139 08/18/2012 1321   K 3.3 (L) 10/29/2019 0841   K 3.7 08/18/2012 1321   CL 106 10/29/2019 0841   CL 104 08/18/2012 1321   CO2 21 (L) 10/29/2019 0841   CO2 27 08/18/2012 1321   BUN 20 10/29/2019 0841   BUN 18 08/18/2012 1321   CREATININE 0.61 10/29/2019 0841   CREATININE 0.82 08/18/2012 1321   GLUCOSE 154 (H) 10/29/2019 0841   GLUCOSE 68 08/18/2012 1321   CALCIUM 8.5 (L) 10/29/2019 0841   CALCIUM 9.0 08/18/2012 1321   AST 41 10/29/2019 0841   AST 36 08/18/2012 1321   ALT 32 10/29/2019 0841   ALT 51 08/18/2012 1321   ALKPHOS 104 10/29/2019 0841   ALKPHOS 88 08/18/2012 1321   BILITOT 0.7 10/29/2019 0841   BILITOT 0.4 08/18/2012 1321   PROT 6.7 10/29/2019 0841   PROT 8.0 08/18/2012 1321   ALBUMIN 3.4 (L) 10/29/2019 0841   ALBUMIN 4.1 08/18/2012 1321    RADIOGRAPHIC STUDIES: No results found.  PERFORMANCE STATUS (ECOG) : 0 - Asymptomatic  Review of Systems Unless otherwise noted, a complete review of systems is negative.  Physical Exam General: NAD, frail appearing, thin Pulmonary: Unlabored Extremities: no edema Skin: no rashes Neurological: Weakness but otherwise nonfocal  IMPRESSION: Routine follow-up visit today.  Patient was seen in the infusion area.  Patient says he is doing well.  He denies any complaints or concerns today.  Patient says he has no pain, nausea, or other distressing symptoms.  He reports that his oral intake is good.  Weight is stable 145 pounds.  Patient says that he is sleeping well and coping well emotionally.  PLAN: -Continue current scope of treatment -Follow-up telephone visit in 4 weeks   Patient expressed understanding and was in agreement with this plan. He also understands that He can call the clinic at any time with any questions, concerns, or complaints.     Time Total: 15 minutes  Visit consisted of counseling and education dealing with the complex and emotionally intense issues  of symptom management and palliative care in the setting of serious and potentially life-threatening illness.Greater than 50%  of this time was spent counseling and coordinating care related to the above assessment and plan.  Signed by: Altha Harm, PhD, NP-C [phone] (Work Cell)

## 2019-10-29 NOTE — Progress Notes (Signed)
Patient stated he had been doing well with no complaints.

## 2019-10-29 NOTE — Progress Notes (Signed)
Hematology/Oncology Consult note [facility]  Telephone:([phone] Fax:([phone]  Patient Care Team: Jodi Marble, MD as PCP - General (Internal Medicine) Clent Jacks, RN as Oncology Nurse Navigator   Name of the patient: Eric Simon  [id number]  13-Dec-1951   Date of visit: 10/29/19  Diagnosis- metastatic colon cancer with lymph node metastases K-ras/BRAF wild-type  Chief complaint/ Reason for visit-on treatment assessment prior to cycle 9 of FOLFOX Avastin chemotherapy  Heme/Onc history: Patient is a 68 yr old male with >40 pack year history of smoking. He currently smokes 0.5ppd. he presented to the ER with symptoms of left sided chest pain and left arm pain. Troponin was negative ekg was unremarkable. Ct chest showed no PE. He was incidentally noted to have mediastinal and retrocrural adenopathy and a 2.1X2.3X4.4 cm retroaortic soft tissue lesion in the posterior left chest. 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 Low-grade activity in the left lateral fifth and sixth ribs associated with nondisplaced fractures likely reflecting healing response problem malignancy.  Patient underwent CT-guided biopsy of the retroperitoneal lymph node pathology showed metastatic adenocarcinoma compatible with colorectal origin. CK7 negative. CK20 positive. CDX 2+. TTF-1 negative. PSA negative. This pattern of immunoreactivity supports the above diagnosis. Patient underwent colonoscopy which showed sigmoid mass that was consistent with adenocarcinoma. Rast panel testing showed that he was wild-type for both K-ras and BRAF   Interval history-reports tolerating chemotherapy well at this time.  Denies any nausea or vomiting.  Denies any tingling numbness in his extremities  ECOG PS- 1 Pain scale- 0 Opioid associated  constipation- 0  Review of systems- Review of Systems  Constitutional: Positive for malaise/fatigue. Negative for chills, fever and weight loss.  HENT: Negative for congestion, ear discharge and nosebleeds.   Eyes: Negative for blurred vision.  Respiratory: Negative for cough, hemoptysis, sputum production, shortness of breath and wheezing.   Cardiovascular: Negative for chest pain, palpitations, orthopnea and claudication.  Gastrointestinal: Negative for abdominal pain, blood in stool, constipation, diarrhea, heartburn, melena, nausea and vomiting.  Genitourinary: Negative for dysuria, flank pain, frequency, hematuria and urgency.  Musculoskeletal: Negative for back pain, joint pain and myalgias.  Skin: Negative for rash.  Neurological: Negative for dizziness, tingling, focal weakness, seizures, weakness and headaches.  Endo/Heme/Allergies: Does not bruise/bleed easily.  Psychiatric/Behavioral: Negative for depression and suicidal ideas. The patient does not have insomnia.       No Known Allergies   Past Medical History:  Diagnosis Date  . Colon cancer ([place])   . COPD (chronic obstructive pulmonary disease) ([place])   . Hyperlipidemia      Past Surgical History:  Procedure Laterality Date  . COLONOSCOPY WITH PROPOFOL N/A 06/26/2019   Procedure: COLONOSCOPY WITH PROPOFOL;  Surgeon: [name], MD;  Location: [facility] END[facility]COPY;  Service: Gastroenterology;  Laterality: N/A;  . PORTA CATH INSERTION N/A 06/25/2019   Procedure: PORTA CATH INSERTION;  Surgeon: Algernon Huxley, MD;  Location: [place] [facility];  Service: Cardiovascular;  Laterality: N/A;    Social History   Socioeconomic History  . Marital status: Single    Spouse name: Not on file  . Number of children: Not on file  . Years of education: Not on file  . Highest education level: Not on file  Occupational History  . Not on file  Social Needs  . Financial resource strain: Not hard at all  .  Food insecurity    Worry:  Never true    Inability: Never true  . Transportation needs    Medical: No    Non-medical: No  Tobacco Use  . Smoking status: Current Every Day Smoker    Packs/day: 0.50    Years: 40.00    Pack years: 20.00    Types: Cigarettes  . Smokeless tobacco: Never Used  . Tobacco comment: smoking 40 years  Substance and Sexual Activity  . Alcohol use: No  . Drug use: No  . Sexual activity: Not Currently  Lifestyle  . Physical activity    Days per week: Not on file    Minutes per session: Not on file  . Stress: Not at all  Relationships  . Social connections    Talks on phone: More than three times a week    Gets together: Not on file    Attends religious service: Not on file    Active member of club or organization: Not on file    Attends meetings of clubs or organizations: Not on file    Relationship status: Not on file  . Intimate partner violence    Fear of current or ex partner: No    Emotionally abused: No    Physically abused: No    Forced sexual activity: No  Other Topics Concern  . Not on file  Social History Narrative  . Not on file    Family History  Problem Relation Age of Onset  . Brain cancer Father      Current Outpatient Medications:  .  aspirin EC 81 MG tablet, Take 81 mg by mouth daily., Disp: , Rfl:  .  azelastine (ASTELIN) 0.1 % nasal spray, Place 1 spray into both nostrils 1 day or 1 dose., Disp: , Rfl:  .  cetirizine (ZYRTEC) 10 MG tablet, Take 1 tablet (10 mg total) by mouth daily. (Patient taking differently: Take 10 mg by mouth daily as needed. ), Disp: 30 tablet, Rfl: 0 .  dexamethasone (DECADRON) 4 MG tablet, Take 2 tablets (8 mg total) by mouth daily. Start the day after chemotherapy for 2 days. Take with food., Disp: 30 tablet, Rfl: 1 .  fluticasone (VERAMYST) 27.5 MCG/SPRAY nasal spray, Place 2 sprays into the nose daily., Disp: , Rfl:  .  lidocaine-prilocaine (EMLA) cream, Apply to affected area once, Disp: 30 g, Rfl: 3 .  magic mouthwash  w/lidocaine SOLN, Take 5-10 mLs by mouth 4 (four) times daily as needed for mouth pain., Disp: 480 mL, Rfl: 3 .  montelukast (SINGULAIR) 10 MG tablet, Take 10 mg by mouth at bedtime., Disp: , Rfl:  .  nicotine (NICODERM CQ - DOSED IN MG/24 HOURS) 21 mg/24hr patch, Place 1 patch onto the skin 1 day or 1 dose., Disp: , Rfl:  .  nystatin (MYCOSTATIN) 100000 UNIT/ML suspension, Take 5 mLs (500,000 Units total) by mouth 3 (three) times daily. Swish and Spit, Disp: 60 mL, Rfl: 0 .  ondansetron (ZOFRAN) 8 MG tablet, Take 1 tablet (8 mg total) by mouth 2 (two) times daily as needed for refractory nausea / vomiting. Start on day 3 after chemotherapy., Disp: 30 tablet, Rfl: 1 .  pantoprazole (PROTONIX) 40 MG tablet, Take 40 mg by mouth daily., Disp: , Rfl:  .  potassium chloride SA (K-DUR) 20 MEQ tablet, Take 1 tablet (20 mEq total) by mouth daily., Disp: 14 tablet, Rfl: 0 .  prochlorperazine (COMPAZINE) 10 MG tablet, Take 1 tablet (10 mg total) by mouth every  6 (six) hours as needed (Nausea or vomiting)., Disp: 30 tablet, Rfl: 1 .  simvastatin (ZOCOR) 20 MG tablet, Take 20 mg by mouth daily., Disp: , Rfl:  .  sucralfate (CARAFATE) 1 g tablet, Take 1 g by mouth 4 (four) times daily., Disp: , Rfl:  .  tamsulosin (FLOMAX) 0.4 MG CAPS capsule, Take 0.4 mg by mouth., Disp: , Rfl:  .  traMADol-acetaminophen (ULTRACET) 37.5-325 MG tablet, Take 1 tablet by mouth every 6 (six) hours., Disp: , Rfl:  No current facility-administered medications for this visit.   Facility-Administered Medications Ordered in Other Visits:  .  dextrose 5 % solution, , Intravenous, Once, Sindy Guadeloupe, MD .  fluorouracil (ADRUCIL) 4,300 mg in sodium chloride 0.9 % 64 mL chemo infusion, 2,400 mg/m2 (Treatment Plan Recorded), Intravenous, 1 day or 1 dose, Sindy Guadeloupe, MD .  fluorouracil (ADRUCIL) chemo injection 700 mg, 400 mg/m2 (Treatment Plan Recorded), Intravenous, Once, Sindy Guadeloupe, MD .  heparin lock flush 100 unit/mL, 500  Units, Intravenous, Once, Sindy Guadeloupe, MD .  leucovorin 700 mg in dextrose 5 % 250 mL infusion, 700 mg, Intravenous, Once, Sindy Guadeloupe, MD .  oxaliplatin (ELOXATIN) 115 mg in dextrose 5 % 500 mL chemo infusion, 65 mg/m2 (Treatment Plan Recorded), Intravenous, Once, Sindy Guadeloupe, MD .  sodium chloride flush (NS) 0.9 % injection 10 mL, 10 mL, Intracatheter, PRN, Sindy Guadeloupe, MD, 10 mL at 08/15/19 1405  Physical exam:  Vitals:   10/29/19 0855  BP: 127/71  Pulse: 83  Temp: (!) 97.1 F (36.2 C)  TempSrc: Tympanic  Weight: 145 lb (65.8 kg)   Physical Exam Constitutional:      General: He is not in acute distress. HENT:     Head: Normocephalic and atraumatic.  Eyes:     Pupils: Pupils are equal, round, and reactive to light.  Neck:     Musculoskeletal: Normal range of motion.  Cardiovascular:     Rate and Rhythm: Normal rate and regular rhythm.     Heart sounds: Normal heart sounds.  Pulmonary:     Effort: Pulmonary effort is normal.     Breath sounds: Normal breath sounds.  Abdominal:     General: Bowel sounds are normal.     Palpations: Abdomen is soft.  Skin:    General: Skin is warm and dry.  Neurological:     Mental Status: He is alert and oriented to person, place, and time.      CMP Latest Ref Rng & Units 10/29/2019  Glucose 70 - 99 mg/dL 154(H)  BUN 8 - 23 mg/dL 20  Creatinine 0.61 - 1.24 mg/dL 0.61  Sodium 135 - 145 mmol/L 134(L)  Potassium 3.5 - 5.1 mmol/L 3.3(L)  Chloride 98 - 111 mmol/L 106  CO2 22 - 32 mmol/L 21(L)  Calcium 8.9 - 10.3 mg/dL 8.5(L)  Total Protein 6.5 - 8.1 g/dL 6.7  Total Bilirubin 0.3 - 1.2 mg/dL 0.7  Alkaline Phos 38 - 126 U/L 104  AST 15 - 41 U/L 41  ALT 0 - 44 U/L 32   CBC Latest Ref Rng & Units 10/29/2019  WBC 4.0 - 10.5 K/uL 4.7  Hemoglobin 13.0 - 17.0 g/dL 12.9(L)  Hematocrit 39.0 - 52.0 % 38.7(L)  Platelets 150 - 400 K/uL 135(L)     Assessment and plan- Patient is a 68 y.o. male with metastatic colon cancer [place] NX  M1 with generalized lymphadenopathy K-ras/beta wild type.  He is here for on  treatment assessment prior to cycle 9 of FOLFOX Avastin chemotherapy  Counts okay to proceed with cycle 9 of FOLFOX Avastin chemotherapy today.  We will get from DC on day 3.  Urine protein is trace of 22 which we will continue to monitor.  Blood pressure is stable patient would like to receive chemotherapy every 3 weeks instead of every 2 weeks to allow for better tolerance and to maintain quality of life.  Patient has  low volume disease and it would be okay to switch him to every 3 weeks.  I will see him back in 3 weeks time with CBC with differential, CMP and CEA for cycle 10 of FOLFOX [place] chemotherapy  Hypokalemia: We will give him prescription for 20 mEq of oral potassium daily for 2 weeks.  Mild thrombocytopenia: Possibly secondary to oxaliplatin.  Continue to monitor Visit Diagnosis 1. Encounter for antineoplastic chemotherapy   2. 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 ([place])   3. Encounter for monoclonal antibody treatment for malignancy   4. Hypokalemia   5. Chemotherapy-induced thrombocytopenia      Dr. Randa Evens, MD, MPH [facility] [phone] 10/29/2019 10:59 AM

## 2019-10-29 NOTE — Progress Notes (Signed)
0943: Per Dr, [name] okay to proceed with 10/15/2019 urine protein of 22. Pt unable to give urine sample at this time.

## 2019-10-30 ENCOUNTER — Other Ambulatory Visit: Payer: Medicare HMO

## 2019-10-30 ENCOUNTER — Ambulatory Visit: Payer: Medicare HMO | Admitting: Oncology

## 2019-10-30 ENCOUNTER — Ambulatory Visit: Payer: Medicare HMO

## 2019-10-31 ENCOUNTER — Inpatient Hospital Stay: Payer: Medicare HMO

## 2019-10-31 ENCOUNTER — Other Ambulatory Visit: Payer: Self-pay

## 2019-10-31 VITALS — BP 129/74 | HR 89 | Temp 96.9°F | Resp 20

## 2019-10-31 DIAGNOSIS — C772 Secondary and unspecified malignant neoplasm of intra-abdominal lymph nodes: Secondary | ICD-10-CM

## 2019-10-31 DIAGNOSIS — C189 Malignant neoplasm of colon, unspecified: Secondary | ICD-10-CM

## 2019-10-31 DIAGNOSIS — Z5112 Encounter for antineoplastic immunotherapy: Secondary | ICD-10-CM | POA: Diagnosis not present

## 2019-10-31 MED ORDER — HEPARIN SOD (PORK) LOCK FLUSH 100 UNIT/ML IV SOLN
500.0000 [IU] | Freq: Once | INTRAVENOUS | Status: AC | PRN
  Administered 2019-10-31: 500 [IU]
  Filled 2019-10-31: qty 5

## 2019-10-31 MED ORDER — SODIUM CHLORIDE 0.9% FLUSH
10.0000 mL | INTRAVENOUS | Status: DC | PRN
  Filled 2019-10-31: qty 10

## 2019-11-06 ENCOUNTER — Ambulatory Visit: Payer: Medicare HMO

## 2019-11-19 ENCOUNTER — Inpatient Hospital Stay (HOSPITAL_BASED_OUTPATIENT_CLINIC_OR_DEPARTMENT_OTHER): Payer: Medicare HMO | Admitting: Oncology

## 2019-11-19 ENCOUNTER — Telehealth: Payer: Self-pay | Admitting: Pulmonary Disease

## 2019-11-19 ENCOUNTER — Encounter: Payer: Self-pay | Admitting: Oncology

## 2019-11-19 ENCOUNTER — Inpatient Hospital Stay: Payer: Medicare HMO | Attending: Oncology

## 2019-11-19 ENCOUNTER — Other Ambulatory Visit: Payer: Self-pay

## 2019-11-19 ENCOUNTER — Inpatient Hospital Stay: Payer: Medicare HMO

## 2019-11-19 VITALS — BP 116/65 | HR 87 | Temp 98.7°F | Wt 139.5 lb

## 2019-11-19 DIAGNOSIS — Z5112 Encounter for antineoplastic immunotherapy: Secondary | ICD-10-CM

## 2019-11-19 DIAGNOSIS — R110 Nausea: Secondary | ICD-10-CM | POA: Insufficient documentation

## 2019-11-19 DIAGNOSIS — C187 Malignant neoplasm of sigmoid colon: Secondary | ICD-10-CM | POA: Insufficient documentation

## 2019-11-19 DIAGNOSIS — Z452 Encounter for adjustment and management of vascular access device: Secondary | ICD-10-CM | POA: Diagnosis not present

## 2019-11-19 DIAGNOSIS — C772 Secondary and unspecified malignant neoplasm of intra-abdominal lymph nodes: Secondary | ICD-10-CM | POA: Diagnosis not present

## 2019-11-19 DIAGNOSIS — C786 Secondary malignant neoplasm of retroperitoneum and peritoneum: Secondary | ICD-10-CM | POA: Diagnosis not present

## 2019-11-19 DIAGNOSIS — C189 Malignant neoplasm of colon, unspecified: Secondary | ICD-10-CM

## 2019-11-19 DIAGNOSIS — R140 Abdominal distension (gaseous): Secondary | ICD-10-CM | POA: Diagnosis not present

## 2019-11-19 DIAGNOSIS — Z5111 Encounter for antineoplastic chemotherapy: Secondary | ICD-10-CM | POA: Diagnosis present

## 2019-11-19 DIAGNOSIS — R945 Abnormal results of liver function studies: Secondary | ICD-10-CM | POA: Insufficient documentation

## 2019-11-19 LAB — COMPREHENSIVE METABOLIC PANEL
ALT: 43 U/L (ref 0–44)
AST: 49 U/L — ABNORMAL HIGH (ref 15–41)
Albumin: 3.9 g/dL (ref 3.5–5.0)
Alkaline Phosphatase: 107 U/L (ref 38–126)
Anion gap: 8 (ref 5–15)
BUN: 26 mg/dL — ABNORMAL HIGH (ref 8–23)
CO2: 23 mmol/L (ref 22–32)
Calcium: 9.3 mg/dL (ref 8.9–10.3)
Chloride: 104 mmol/L (ref 98–111)
Creatinine, Ser: 0.83 mg/dL (ref 0.61–1.24)
GFR calc Af Amer: >60 mL/min (ref >60)
GFR calc non Af Amer: >60 mL/min (ref >60)
Glucose, Bld: 102 mg/dL — ABNORMAL HIGH (ref 70–99)
Potassium: 3.7 mmol/L (ref 3.5–5.1)
Sodium: 135 mmol/L (ref 135–145)
Total Bilirubin: 0.6 mg/dL (ref 0.3–1.2)
Total Protein: 7.6 g/dL (ref 6.5–8.1)

## 2019-11-19 LAB — CBC WITH DIFFERENTIAL/PLATELET
Abs Immature Granulocytes: 0.03 10*3/uL (ref 0.00–0.07)
Basophils Absolute: 0.1 10*3/uL (ref 0.0–0.1)
Basophils Relative: 1 %
Eosinophils Absolute: 0.1 10*3/uL (ref 0.0–0.5)
Eosinophils Relative: 2 %
HCT: 44.8 % (ref 39.0–52.0)
Hemoglobin: 15 g/dL (ref 13.0–17.0)
Immature Granulocytes: 1 %
Lymphocytes Relative: 41 %
Lymphs Abs: 2.2 10*3/uL (ref 0.7–4.0)
MCH: 32.2 pg (ref 26.0–34.0)
MCHC: 33.5 g/dL (ref 30.0–36.0)
MCV: 96.1 fL (ref 80.0–100.0)
Monocytes Absolute: 1 10*3/uL (ref 0.1–1.0)
Monocytes Relative: 19 %
Neutro Abs: 1.9 10*3/uL (ref 1.7–7.7)
Neutrophils Relative %: 36 %
Platelets: 197 10*3/uL (ref 150–400)
RBC: 4.66 MIL/uL (ref 4.22–5.81)
RDW: 15 % (ref 11.5–15.5)
WBC: 5.2 10*3/uL (ref 4.0–10.5)
nRBC: 0 % (ref 0.0–0.2)

## 2019-11-19 MED ORDER — PALONOSETRON HCL INJECTION 0.25 MG/5ML
0.2500 mg | Freq: Once | INTRAVENOUS | Status: AC
  Administered 2019-11-19: 0.25 mg via INTRAVENOUS
  Filled 2019-11-19: qty 5

## 2019-11-19 MED ORDER — OXALIPLATIN CHEMO INJECTION 100 MG/20ML
65.0000 mg/m2 | Freq: Once | INTRAVENOUS | Status: AC
  Administered 2019-11-19: 115 mg via INTRAVENOUS
  Filled 2019-11-19: qty 20

## 2019-11-19 MED ORDER — SODIUM CHLORIDE 0.9 % IV SOLN
2400.0000 mg/m2 | INTRAVENOUS | Status: DC
  Administered 2019-11-19: 4300 mg via INTRAVENOUS
  Filled 2019-11-19: qty 86

## 2019-11-19 MED ORDER — DEXAMETHASONE SODIUM PHOSPHATE 10 MG/ML IJ SOLN
10.0000 mg | Freq: Once | INTRAMUSCULAR | Status: AC
  Administered 2019-11-19: 10 mg via INTRAVENOUS
  Filled 2019-11-19: qty 1

## 2019-11-19 MED ORDER — SODIUM CHLORIDE 0.9 % IV SOLN
5.0000 mg/kg | Freq: Once | INTRAVENOUS | Status: AC
  Administered 2019-11-19: 350 mg via INTRAVENOUS
  Filled 2019-11-19: qty 14

## 2019-11-19 MED ORDER — FLUOROURACIL CHEMO INJECTION 2.5 GM/50ML
400.0000 mg/m2 | Freq: Once | INTRAVENOUS | Status: AC
  Administered 2019-11-19: 12:00:00 700 mg via INTRAVENOUS
  Filled 2019-11-19: qty 14

## 2019-11-19 MED ORDER — SODIUM CHLORIDE 0.9 % IV SOLN
INTRAVENOUS | Status: DC
  Administered 2019-11-19: 09:00:00 via INTRAVENOUS
  Filled 2019-11-19: qty 250

## 2019-11-19 MED ORDER — LEUCOVORIN CALCIUM INJECTION 350 MG
700.0000 mg | Freq: Once | INTRAVENOUS | Status: AC
  Administered 2019-11-19: 700 mg via INTRAVENOUS
  Filled 2019-11-19: qty 10

## 2019-11-19 MED ORDER — DEXTROSE 5 % IV SOLN
Freq: Once | INTRAVENOUS | Status: AC
  Administered 2019-11-19: 10:00:00 via INTRAVENOUS
  Filled 2019-11-19: qty 250

## 2019-11-19 NOTE — Progress Notes (Signed)
Pt states that he has indigestion all the time and it lasts for several hours. He gets nauseated after eating and he feels like that why he has lost wt. He was told by PCP that he had ulcer and that is why he was put on PPI. He does not know what the name is but it starts with C but he does not take it. When he did it always made him dizzy so he stopped taking it

## 2019-11-19 NOTE — Telephone Encounter (Signed)
Left message for pt to relay date/time of covid test. 11/21/2019 prior to 11:00 [facility]

## 2019-11-20 ENCOUNTER — Other Ambulatory Visit: Payer: Self-pay

## 2019-11-20 LAB — CEA: CEA: 5.2 ng/mL — ABNORMAL HIGH (ref 0.0–4.7)

## 2019-11-20 NOTE — Progress Notes (Signed)
Hematology/Oncology Consult note [facility]  [phone] [phone]  Patient Care Team: Jodi Marble, MD as PCP - General (Internal Medicine) Clent Jacks, RN as Oncology Nurse Navigator   Name of the patient: Eric Simon  [id number]  1951-06-23   Date of visit: 11/20/19  Diagnosis- metastatic colon cancer with lymph node metastases K-ras/BRAF wild-type  Chief complaint/ Reason for visit-on treatment assessment prior to cycle 10 of FOLFOX Mvasi chemotherapy  Heme/Onc history: Patient is a 68 yr old male with >40 pack year history of smoking. He currently smokes 0.5ppd. he presented to the ER with symptoms of left sided chest pain and left arm pain. Troponin was negative ekg was unremarkable. Ct chest showed no PE. He was incidentally noted to have mediastinal and retrocrural adenopathy and a 2.1X2.3X4.4 cm retroaortic soft tissue lesion in the posterior left chest. 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 Low-grade activity in the left lateral fifth and sixth ribs associated with nondisplaced fractures likely reflecting healing response problem malignancy.  Patient underwent CT-guided biopsy of the retroperitoneal lymph node pathology showed metastatic adenocarcinoma compatible with colorectal origin. CK7 negative. CK20 positive. CDX 2+. TTF-1 negative. PSA negative. This pattern of immunoreactivity supports the above diagnosis. Patient underwent colonoscopy which showed sigmoid mass that was consistent with adenocarcinoma. Rast panel testing showed that he was wild-type for both K-ras and BRAF   Interval history-reports having constant burping and bloating sensation over the last few weeks which sometimes wakes him up from his sleep.  Reports nausea right after eating his food.  He has tried both  Compazine and Zofran but says it does not help.  He has lost 6 pounds since his last visit.  ECOG PS- 1 Pain scale- 0   Review of systems- Review of Systems  Constitutional: Positive for malaise/fatigue. Negative for chills, fever and weight loss.  HENT: Negative for congestion, ear discharge and nosebleeds.   Eyes: Negative for blurred vision.  Respiratory: Negative for cough, hemoptysis, sputum production, shortness of breath and wheezing.   Cardiovascular: Negative for chest pain, palpitations, orthopnea and claudication.  Gastrointestinal: Positive for nausea. Negative for abdominal pain, blood in stool, constipation, diarrhea, heartburn, melena and vomiting.       Bloating  Genitourinary: Negative for dysuria, flank pain, frequency, hematuria and urgency.  Musculoskeletal: Negative for back pain, joint pain and myalgias.  Skin: Negative for rash.  Neurological: Negative for dizziness, tingling, focal weakness, seizures, weakness and headaches.  Endo/Heme/Allergies: Does not bruise/bleed easily.  Psychiatric/Behavioral: Negative for depression and suicidal ideas. The patient does not have insomnia.       No Known Allergies   Past Medical History:  Diagnosis Date  . Colon cancer ([place])   . COPD (chronic obstructive pulmonary disease) ([place])   . Hyperlipidemia      Past Surgical History:  Procedure Laterality Date  . COLONOSCOPY WITH PROPOFOL N/A 06/26/2019   Procedure: COLONOSCOPY WITH PROPOFOL;  Surgeon: [name], MD;  Location: [facility] ENDOSCOPY;  Service: Gastroenterology;  Laterality: N/A;  . PORTA CATH INSERTION N/A 06/25/2019   Procedure: PORTA CATH INSERTION;  Surgeon: Algernon Huxley, MD;  Location: [facility];  Service: Cardiovascular;  Laterality: N/A;    Social History   Socioeconomic History  . Marital status: Single    Spouse name: Not on file  . Number of children: Not on file  .  Years of education: Not on file  . Highest education level: Not on  file  Occupational History  . Not on file  Social Needs  . Financial resource strain: Not hard at all  . Food insecurity    Worry: Never true    Inability: Never true  . Transportation needs    Medical: No    Non-medical: No  Tobacco Use  . Smoking status: Current Every Day Smoker    Packs/day: 0.50    Years: 40.00    Pack years: 20.00    Types: Cigarettes  . Smokeless tobacco: Never Used  . Tobacco comment: smoking 40 years  Substance and Sexual Activity  . Alcohol use: No  . Drug use: No  . Sexual activity: Not Currently  Lifestyle  . Physical activity    Days per week: Not on file    Minutes per session: Not on file  . Stress: Not at all  Relationships  . Social connections    Talks on phone: More than three times a week    Gets together: Not on file    Attends religious service: Not on file    Active member of club or organization: Not on file    Attends meetings of clubs or organizations: Not on file    Relationship status: Not on file  . Intimate partner violence    Fear of current or ex partner: No    Emotionally abused: No    Physically abused: No    Forced sexual activity: No  Other Topics Concern  . Not on file  Social History Narrative  . Not on file    Family History  Problem Relation Age of Onset  . Brain cancer Father      Current Outpatient Medications:  .  aspirin EC 81 MG tablet, Take 81 mg by mouth daily., Disp: , Rfl:  .  azelastine (ASTELIN) 0.1 % nasal spray, Place 1 spray into both nostrils 1 day or 1 dose., Disp: , Rfl:  .  cetirizine (ZYRTEC) 10 MG tablet, Take 1 tablet (10 mg total) by mouth daily. (Patient taking differently: Take 10 mg by mouth daily as needed. ), Disp: 30 tablet, Rfl: 0 .  dexamethasone (DECADRON) 4 MG tablet, Take 2 tablets (8 mg total) by mouth daily. Start the day after chemotherapy for 2 days. Take with food., Disp: 30 tablet, Rfl: 1 .  lidocaine-prilocaine (EMLA) cream, Apply to affected area once, Disp: 30 g,  Rfl: 3 .  montelukast (SINGULAIR) 10 MG tablet, Take 10 mg by mouth at bedtime., Disp: , Rfl:  .  ondansetron (ZOFRAN) 8 MG tablet, Take 1 tablet (8 mg total) by mouth 2 (two) times daily as needed for refractory nausea / vomiting. Start on day 3 after chemotherapy., Disp: 30 tablet, Rfl: 1 .  pantoprazole (PROTONIX) 40 MG tablet, Take 40 mg by mouth daily., Disp: , Rfl:  .  potassium chloride SA (K-DUR) 20 MEQ tablet, Take 1 tablet (20 mEq total) by mouth daily., Disp: 14 tablet, Rfl: 0 .  prochlorperazine (COMPAZINE) 10 MG tablet, Take 1 tablet (10 mg total) by mouth every 6 (six) hours as needed (Nausea or vomiting)., Disp: 30 tablet, Rfl: 1 .  simvastatin (ZOCOR) 20 MG tablet, Take 20 mg by mouth daily., Disp: , Rfl:  .  sucralfate (CARAFATE) 1 g tablet, Take 1 g by mouth 4 (four) times daily., Disp: , Rfl:  .  tamsulosin (FLOMAX) 0.4 MG CAPS capsule, Take 0.4 mg by mouth.,  Disp: , Rfl:  .  fluticasone (VERAMYST) 27.5 MCG/SPRAY nasal spray, Place 2 sprays into the nose daily., Disp: , Rfl:  .  magic mouthwash w/lidocaine SOLN, Take 5-10 mLs by mouth 4 (four) times daily as needed for mouth pain. (Patient not taking: Reported on 11/19/2019), Disp: 480 mL, Rfl: 3 .  nicotine (NICODERM CQ - DOSED IN MG/24 HOURS) 21 mg/24hr patch, Place 1 patch onto the skin 1 day or 1 dose., Disp: , Rfl:  .  traMADol-acetaminophen (ULTRACET) 37.5-325 MG tablet, Take 1 tablet by mouth every 6 (six) hours., Disp: , Rfl:  No current facility-administered medications for this visit.   Facility-Administered Medications Ordered in Other Visits:  .  sodium chloride flush (NS) 0.9 % injection 10 mL, 10 mL, Intracatheter, PRN, Sindy Guadeloupe, MD, 10 mL at 08/15/19 1405  Physical exam:  Vitals:   11/19/19 0829  BP: 116/65  Pulse: 87  Temp: 98.7 F (37.1 C)  TempSrc: Tympanic  Weight: 139 lb 8 oz (63.3 kg)   Physical Exam HENT:     Head: Normocephalic and atraumatic.  Eyes:     Pupils: Pupils are equal, round,  and reactive to light.  Neck:     Musculoskeletal: Normal range of motion.  Cardiovascular:     Rate and Rhythm: Normal rate and regular rhythm.     Heart sounds: Normal heart sounds.  Pulmonary:     Effort: Pulmonary effort is normal.     Breath sounds: Normal breath sounds.  Abdominal:     General: Bowel sounds are normal. There is no distension.     Palpations: Abdomen is soft.     Tenderness: There is no abdominal tenderness.  Skin:    General: Skin is warm and dry.  Neurological:     Mental Status: He is alert and oriented to person, place, and time.      CMP Latest Ref Rng & Units 11/19/2019  Glucose 70 - 99 mg/dL 102(H)  BUN 8 - 23 mg/dL 26(H)  Creatinine 0.61 - 1.24 mg/dL 0.83  Sodium 135 - 145 mmol/L 135  Potassium 3.5 - 5.1 mmol/L 3.7  Chloride 98 - 111 mmol/L 104  CO2 22 - 32 mmol/L 23  Calcium 8.9 - 10.3 mg/dL 9.3  Total Protein 6.5 - 8.1 g/dL 7.6  Total Bilirubin 0.3 - 1.2 mg/dL 0.6  Alkaline Phos 38 - 126 U/L 107  AST 15 - 41 U/L 49(H)  ALT 0 - 44 U/L 43   CBC Latest Ref Rng & Units 11/19/2019  WBC 4.0 - 10.5 K/uL 5.2  Hemoglobin 13.0 - 17.0 g/dL 15.0  Hematocrit 39.0 - 52.0 % 44.8  Platelets 150 - 400 K/uL 197     Assessment and plan- Patient is a 68 y.o. male with metastatic colon cancer TX NX M1 with generalized lymphadenopathy K-ras/beta wild type. He is here for on treatment assessment prior to cycle 10 of FOLFOX Mvasi chemotherapy  Counts okay to proceed with cycle 10 of FOLFOX Mvasi chemotherapy today.  He wishes to get treatment every 3 weeks instead of every 2 weeks for better tolerance.  He will come back in 2 days for pump disconnect.  I will see him back in 3 weeks with CBC with differential, CMP and CEA for cycle 11.  Plan to repeat scans after 11 cycles.  Abdominal bloating/nausea: Compazine and Zofran have not helped.  I could potentially add Zyprexa for his nausea but given his additional symptoms including constant burping and bloating I  would like for him to see GI.  I did touch base with Dr. Orvil Feil regarding this   Visit Diagnosis 1. Encounter for antineoplastic chemotherapy   2. 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 Abdominal bloating      Dr. Randa Evens, MD, MPH [facility] [phone] 11/20/2019 8:49 AM

## 2019-11-20 NOTE — Telephone Encounter (Signed)
Pt is aware of date/time of covid test.   

## 2019-11-21 ENCOUNTER — Inpatient Hospital Stay: Payer: Medicare HMO

## 2019-11-21 ENCOUNTER — Other Ambulatory Visit: Admission: RE | Admit: 2019-11-21 | Payer: Medicare HMO | Source: Ambulatory Visit

## 2019-11-21 ENCOUNTER — Other Ambulatory Visit: Payer: Self-pay

## 2019-11-21 VITALS — BP 133/84 | HR 84 | Temp 97.7°F | Resp 20

## 2019-11-21 DIAGNOSIS — C189 Malignant neoplasm of colon, unspecified: Secondary | ICD-10-CM

## 2019-11-21 DIAGNOSIS — Z5112 Encounter for antineoplastic immunotherapy: Secondary | ICD-10-CM | POA: Diagnosis not present

## 2019-11-21 DIAGNOSIS — C772 Secondary and unspecified malignant neoplasm of intra-abdominal lymph nodes: Secondary | ICD-10-CM

## 2019-11-21 MED ORDER — SODIUM CHLORIDE 0.9% FLUSH
10.0000 mL | INTRAVENOUS | Status: DC | PRN
  Administered 2019-11-21: 10 mL
  Filled 2019-11-21: qty 10

## 2019-11-21 MED ORDER — HEPARIN SOD (PORK) LOCK FLUSH 100 UNIT/ML IV SOLN
500.0000 [IU] | Freq: Once | INTRAVENOUS | Status: AC | PRN
  Administered 2019-11-21: 500 [IU]
  Filled 2019-11-21: qty 5

## 2019-11-21 NOTE — Telephone Encounter (Signed)
Per pre admit testing, pt did not show for covid test.  Lm to verify this with pt and to ensure he did not go to incorrect testing test.   Will route to [facility] to make aware.

## 2019-11-21 NOTE — Telephone Encounter (Signed)
Called and spoke with [name] and canceled PFT for 11/22/2019.  Did not R/S at this time. Eric Simon

## 2019-11-22 ENCOUNTER — Ambulatory Visit: Payer: Medicare HMO

## 2019-11-26 ENCOUNTER — Inpatient Hospital Stay (HOSPITAL_BASED_OUTPATIENT_CLINIC_OR_DEPARTMENT_OTHER): Payer: Medicare HMO | Admitting: Hospice and Palliative Medicine

## 2019-11-26 DIAGNOSIS — Z515 Encounter for palliative care: Secondary | ICD-10-CM

## 2019-11-27 NOTE — Progress Notes (Signed)
I was unable to reach patient at time of scheduled visit.  Will reschedule.

## 2019-11-30 ENCOUNTER — Ambulatory Visit: Payer: Medicare HMO | Admitting: Gastroenterology

## 2019-12-10 ENCOUNTER — Other Ambulatory Visit: Payer: Self-pay

## 2019-12-10 ENCOUNTER — Encounter: Payer: Self-pay | Admitting: Oncology

## 2019-12-10 ENCOUNTER — Inpatient Hospital Stay: Payer: Medicare HMO

## 2019-12-10 ENCOUNTER — Inpatient Hospital Stay (HOSPITAL_BASED_OUTPATIENT_CLINIC_OR_DEPARTMENT_OTHER): Payer: Medicare HMO | Admitting: Oncology

## 2019-12-10 ENCOUNTER — Ambulatory Visit (INDEPENDENT_AMBULATORY_CARE_PROVIDER_SITE_OTHER): Payer: Medicare HMO | Admitting: Gastroenterology

## 2019-12-10 VITALS — BP 125/67 | HR 71

## 2019-12-10 VITALS — BP 119/82 | HR 86 | Temp 98.1°F | Wt 139.0 lb

## 2019-12-10 VITALS — BP 112/73 | HR 85 | Temp 97.7°F | Ht 67.0 in | Wt 141.6 lb

## 2019-12-10 DIAGNOSIS — R140 Abdominal distension (gaseous): Secondary | ICD-10-CM | POA: Diagnosis not present

## 2019-12-10 DIAGNOSIS — C189 Malignant neoplasm of colon, unspecified: Secondary | ICD-10-CM | POA: Diagnosis not present

## 2019-12-10 DIAGNOSIS — C772 Secondary and unspecified malignant neoplasm of intra-abdominal lymph nodes: Secondary | ICD-10-CM

## 2019-12-10 DIAGNOSIS — T451X5A Adverse effect of antineoplastic and immunosuppressive drugs, initial encounter: Secondary | ICD-10-CM

## 2019-12-10 DIAGNOSIS — R110 Nausea: Secondary | ICD-10-CM

## 2019-12-10 DIAGNOSIS — Z5111 Encounter for antineoplastic chemotherapy: Secondary | ICD-10-CM | POA: Diagnosis not present

## 2019-12-10 DIAGNOSIS — Z5112 Encounter for antineoplastic immunotherapy: Secondary | ICD-10-CM | POA: Diagnosis not present

## 2019-12-10 LAB — CBC WITH DIFFERENTIAL/PLATELET
Abs Immature Granulocytes: 0.03 10*3/uL (ref 0.00–0.07)
Basophils Absolute: 0 10*3/uL (ref 0.0–0.1)
Basophils Relative: 1 %
Eosinophils Absolute: 0.1 10*3/uL (ref 0.0–0.5)
Eosinophils Relative: 2 %
HCT: 42.9 % (ref 39.0–52.0)
Hemoglobin: 14.4 g/dL (ref 13.0–17.0)
Immature Granulocytes: 1 %
Lymphocytes Relative: 41 %
Lymphs Abs: 1.9 10*3/uL (ref 0.7–4.0)
MCH: 32.1 pg (ref 26.0–34.0)
MCHC: 33.6 g/dL (ref 30.0–36.0)
MCV: 95.5 fL (ref 80.0–100.0)
Monocytes Absolute: 1 10*3/uL (ref 0.1–1.0)
Monocytes Relative: 21 %
Neutro Abs: 1.6 10*3/uL — ABNORMAL LOW (ref 1.7–7.7)
Neutrophils Relative %: 34 %
Platelets: 183 10*3/uL (ref 150–400)
RBC: 4.49 MIL/uL (ref 4.22–5.81)
RDW: 14.6 % (ref 11.5–15.5)
WBC: 4.7 10*3/uL (ref 4.0–10.5)
nRBC: 0 % (ref 0.0–0.2)

## 2019-12-10 LAB — COMPREHENSIVE METABOLIC PANEL
ALT: 52 U/L — ABNORMAL HIGH (ref 0–44)
AST: 58 U/L — ABNORMAL HIGH (ref 15–41)
Albumin: 3.6 g/dL (ref 3.5–5.0)
Alkaline Phosphatase: 126 U/L (ref 38–126)
Anion gap: 9 (ref 5–15)
BUN: 33 mg/dL — ABNORMAL HIGH (ref 8–23)
CO2: 24 mmol/L (ref 22–32)
Calcium: 9.1 mg/dL (ref 8.9–10.3)
Chloride: 103 mmol/L (ref 98–111)
Creatinine, Ser: 0.84 mg/dL (ref 0.61–1.24)
GFR calc Af Amer: >60 mL/min (ref >60)
GFR calc non Af Amer: >60 mL/min (ref >60)
Glucose, Bld: 99 mg/dL (ref 70–99)
Potassium: 3.9 mmol/L (ref 3.5–5.1)
Sodium: 136 mmol/L (ref 135–145)
Total Bilirubin: 0.7 mg/dL (ref 0.3–1.2)
Total Protein: 7.4 g/dL (ref 6.5–8.1)

## 2019-12-10 LAB — PROTEIN, URINE, RANDOM: Total Protein, Urine: 16 mg/dL

## 2019-12-10 MED ORDER — DEXAMETHASONE SODIUM PHOSPHATE 10 MG/ML IJ SOLN
10.0000 mg | Freq: Once | INTRAMUSCULAR | Status: AC
  Administered 2019-12-10: 10 mg via INTRAVENOUS
  Filled 2019-12-10: qty 1

## 2019-12-10 MED ORDER — FLUOROURACIL CHEMO INJECTION 2.5 GM/50ML
400.0000 mg/m2 | Freq: Once | INTRAVENOUS | Status: AC
  Administered 2019-12-10: 700 mg via INTRAVENOUS
  Filled 2019-12-10: qty 14

## 2019-12-10 MED ORDER — SODIUM CHLORIDE 0.9 % IV SOLN
2400.0000 mg/m2 | INTRAVENOUS | Status: DC
  Administered 2019-12-10: 4300 mg via INTRAVENOUS
  Filled 2019-12-10: qty 86

## 2019-12-10 MED ORDER — SODIUM CHLORIDE 0.9 % IV SOLN
5.0000 mg/kg | Freq: Once | INTRAVENOUS | Status: AC
  Administered 2019-12-10: 325 mg via INTRAVENOUS
  Filled 2019-12-10: qty 13

## 2019-12-10 MED ORDER — DEXTROSE 5 % IV SOLN
Freq: Once | INTRAVENOUS | Status: AC
  Filled 2019-12-10: qty 250

## 2019-12-10 MED ORDER — LEUCOVORIN CALCIUM INJECTION 350 MG
700.0000 mg | Freq: Once | INTRAVENOUS | Status: AC
  Administered 2019-12-10: 700 mg via INTRAVENOUS
  Filled 2019-12-10: qty 35

## 2019-12-10 MED ORDER — SODIUM CHLORIDE 0.9 % IV SOLN
INTRAVENOUS | Status: DC
  Filled 2019-12-10: qty 250

## 2019-12-10 MED ORDER — SODIUM CHLORIDE 0.9 % IV SOLN: 5.0000 mg/kg | Freq: Once | INTRAVENOUS | Status: DC

## 2019-12-10 MED ORDER — SODIUM CHLORIDE 0.9% FLUSH
10.0000 mL | INTRAVENOUS | Status: DC | PRN
  Administered 2019-12-10: 10 mL via INTRAVENOUS
  Filled 2019-12-10: qty 10

## 2019-12-10 MED ORDER — PALONOSETRON HCL INJECTION 0.25 MG/5ML
0.2500 mg | Freq: Once | INTRAVENOUS | Status: AC
  Administered 2019-12-10: 0.25 mg via INTRAVENOUS
  Filled 2019-12-10: qty 5

## 2019-12-10 MED ORDER — OXALIPLATIN CHEMO INJECTION 100 MG/20ML
65.0000 mg/m2 | Freq: Once | INTRAVENOUS | Status: AC
  Administered 2019-12-10: 115 mg via INTRAVENOUS
  Filled 2019-12-10: qty 20

## 2019-12-10 NOTE — Progress Notes (Signed)
Eric Bellows MD, MRCP(U.K) [address]  [place]  [place], [place] [address]  Main: [phone]  Fax: [phone]   Gastroenterology Consultation  Referring Provider:    Dr Eric Simon Primary Care Physician:  Eric Marble, MD Primary Gastroenterologist:  Dr. Jonathon Simon  Reason for Consultation:     Nausea and indigestion        HPI:   Eric Simon is a 67 y.o. y/o male referred for nausea and indigestion.  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  I have performed a colonoscopy which showed sigmoid mass consistent with adenocarcinoma.  No obstructive lesion seen on colonoscopy in July 2020.  Undergoing treatment with oncology.  CT scan of the abdomen in October 2020 demonstrated improving lymphadenopathy in chest abdomen and pelvis.  No colon mass seen in the patient.  He has a 40-pack-year of smoking.  12/10/2019: Hemoglobin 14.4 g, mild elevated AST and ALT of 58 and 52.  She has been commenced on Dexilant for nausea.  Presently undergoing chemotherapy.   Main complaint today is bloating belching.  Has improved significantly after starting on Dexilant.  He is a smoker.  He suffers from possible constipation as he says he has a hard bowel movement daily.  Not taking any medications for the constipation.  Past Medical History:  Diagnosis Date  . Colon cancer ([place])   . COPD (chronic obstructive pulmonary disease) ([place])   . Hyperlipidemia     Past Surgical History:  Procedure Laterality Date  . COLONOSCOPY WITH PROPOFOL N/A 06/26/2019   Procedure: COLONOSCOPY WITH PROPOFOL;  Surgeon: Eric Bellows, MD;  Location: [facility] ENDOSCOPY;  Service: Gastroenterology;  Laterality: N/A;  . PORTA CATH INSERTION N/A 06/25/2019   Procedure: PORTA CATH INSERTION;  Surgeon: Eric Huxley, MD;  Location: [facility];  Service: Cardiovascular;  Laterality: N/A;    Prior to Admission medications   Medication Sig Start Date End Date Taking? Authorizing  Provider  aspirin EC 81 MG tablet Take 81 mg by mouth daily.    [provider]  azelastine (ASTELIN) 0.1 % nasal spray Place 1 spray into both nostrils 1 day or 1 dose. 08/23/19   [provider]  cetirizine (ZYRTEC) 10 MG tablet Take 1 tablet (10 mg total) by mouth daily. Patient taking differently: Take 10 mg by mouth daily as needed.  12/14/16   Eric Simon, Eric L, PA-C  chlorthalidone (HYGROTON) 25 MG tablet Take 1 tablet by mouth daily. 11/21/19   [provider]  dexamethasone (DECADRON) 4 MG tablet Take 2 tablets (8 mg total) by mouth daily. Start the day after chemotherapy for 2 days. Take with food. 07/23/19   Eric Au, Eric Simon  DEXILANT 60 MG capsule Take 1 capsule by mouth 1 day or 1 dose. 11/21/19   [provider]  fluticasone (FLONASE) 50 MCG/ACT nasal spray Place 2 sprays into both nostrils 1 day or 1 dose. 11/21/19   [provider]  fluticasone (VERAMYST) 27.5 MCG/SPRAY nasal spray Place 2 sprays into the nose daily.    [provider]  lidocaine-prilocaine (EMLA) cream Apply to affected area once 06/20/19   Eric Guadeloupe, MD  magic mouthwash w/lidocaine SOLN Take 5-10 mLs by mouth 4 (four) times daily as needed for mouth pain. 07/23/19   Eric Au, Eric Simon  montelukast (SINGULAIR) 10 MG tablet Take 10 mg by mouth at bedtime.    [provider]  nicotine (NICODERM CQ -  DOSED IN MG/24 HOURS) 21 mg/24hr patch Place 1 patch onto the skin 1 day or 1 dose. 08/23/19   [provider]  ondansetron (ZOFRAN) 8 MG tablet Take 1 tablet (8 mg total) by mouth 2 (two) times daily as needed for refractory nausea / vomiting. Start on day 3 after chemotherapy. 06/20/19   Eric Guadeloupe, MD  pantoprazole (PROTONIX) 40 MG tablet Take 40 mg by mouth daily.    [provider]  potassium chloride SA (K-DUR) 20 MEQ tablet Take 1 tablet (20 mEq total) by mouth daily. 08/13/19   Eric Guadeloupe, MD  prochlorperazine (COMPAZINE) 10 MG tablet  Take 1 tablet (10 mg total) by mouth every 6 (six) hours as needed (Nausea or vomiting). 06/20/19   Eric Guadeloupe, MD  simvastatin (ZOCOR) 20 MG tablet Take 20 mg by mouth daily.    [provider]  sucralfate (CARAFATE) 1 g tablet Take 1 g by mouth 4 (four) times daily. 08/22/19   [provider]  SYMBICORT 80-4.5 MCG/ACT inhaler Inhale 2 puffs into the lungs 1 day or 1 dose. 11/21/19   [provider]  tamsulosin (FLOMAX) 0.4 MG CAPS capsule Take 0.4 mg by mouth.    [provider]  traMADol-acetaminophen (ULTRACET) 37.5-325 MG tablet Take 1 tablet by mouth every 6 (six) hours. 06/27/19   [provider]    Family History  Problem Relation Age of Onset  . Brain cancer Father      Social History   Tobacco Use  . Smoking status: Current Every Day Smoker    Packs/day: 0.50    Years: 40.00    Pack years: 20.00    Types: Cigarettes  . Smokeless tobacco: Never Used  . Tobacco comment: smoking 40 years  Substance Use Topics  . Alcohol use: No  . Drug use: No    Allergies as of 12/10/2019  . (No Known Allergies)    Review of Systems:    All systems reviewed and negative except where noted in HPI.   Physical Exam:  There were no vitals taken for this visit. No LMP for male patient. Psych:  Alert and cooperative. Normal mood and affect. General:   Alert,  Well-developed, well-nourished, pleasant and cooperative in NAD Head:  Normocephalic and atraumatic. Eyes:  Sclera clear, no icterus.   Conjunctiva pink. Abdomen:  Normal bowel sounds.  No bruits.  Soft, non-tender and non-distended without masses, hepatosplenomegaly or hernias noted.  No guarding or rebound tenderness.    Neurologic:  Alert and oriented x3;  grossly normal neurologically. Skin:  Intact without significant lesions or rashes. No jaundice. Lymph Nodes:  No significant cervical adenopathy. Psych:  Alert and cooperative. Normal mood and affect.  Imaging Studies: No results  found.  Assessment and Plan:   Eric Simon is a 68 y.o. y/o male has been referred for bloating, belching.  He has a history of smoking and aerophagia may be contributing to the same.  Has improved with Dexilant.  He does suffer from constipation and it may also be causing him to retain gas and distention. Plan 1.  Commence on MiraLAX 1 capful daily.  Samples provided. 2.  Commence on activated charcoal tablets up to 3 times a day.  I have warned him that his stool may turn black in color. 3.  I have informed him to call my office if he feels no better in about 10 to 14 days time.  Follow up in 4 weeks virtual  visit  Dr Eric Bellows MD,MRCP(U.K)

## 2019-12-10 NOTE — Progress Notes (Signed)
Patient stated that he had been doing well with no complaints. 

## 2019-12-10 NOTE — Progress Notes (Signed)
Hematology/Oncology Consult note [facility]  Telephone:([phone] [phone]  Patient Care Team: Jodi Marble, MD as PCP - General (Internal Medicine) Clent Jacks, RN as Oncology Nurse Navigator   Name of the patient: Eric Simon  [id number]  05-Oct-1951   Date of visit: 12/10/19  Diagnosis- metastatic colon cancer with lymph node metastases K-ras/BRAF wild-type  Chief complaint/ Reason for visit-on treatment assessment prior to cycle 11 of FOLFOX Mvasi chemotherapy  Heme/Onc history: Patient is a 68 yr old male with >40 pack year history of smoking. He currently smokes 0.5ppd. he presented to the ER with symptoms of left sided chest pain and left arm pain. Troponin was negative ekg was unremarkable. Ct chest showed no PE. He was incidentally noted to have mediastinal and retrocrural adenopathy and a 2.1X2.3X4.4 cm retroaortic soft tissue lesion in the posterior left chest. 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 Low-grade activity in the left lateral fifth and sixth ribs associated with nondisplaced fractures likely reflecting healing response problem malignancy.  Patient underwent CT-guided biopsy of the retroperitoneal lymph node pathology showed metastatic adenocarcinoma compatible with colorectal origin. CK7 negative. CK20 positive. CDX 2+. TTF-1 negative. PSA negative. This pattern of immunoreactivity supports the above diagnosis. Patient underwent colonoscopy which showed sigmoid mass that was consistent with adenocarcinoma. Rast panel testing showed that he was wild-type for both K-ras and BRAF  Interval history-still reports occasional nausea.  Reports that his heartburn medication was changed to Dexilant and he has been feeling a little better since then  ECOG PS- 1 Pain scale- 0 Opioid  associated constipation- no  Review of systems- Review of Systems  Constitutional: Negative for chills, fever, malaise/fatigue and weight loss.  HENT: Negative for congestion, ear discharge and nosebleeds.   Eyes: Negative for blurred vision.  Respiratory: Negative for cough, hemoptysis, sputum production, shortness of breath and wheezing.   Cardiovascular: Negative for chest pain, palpitations, orthopnea and claudication.  Gastrointestinal: Positive for nausea. Negative for abdominal pain, blood in stool, constipation, diarrhea, heartburn, melena and vomiting.  Genitourinary: Negative for dysuria, flank pain, frequency, hematuria and urgency.  Musculoskeletal: Negative for back pain, joint pain and myalgias.  Skin: Negative for rash.  Neurological: Negative for dizziness, tingling, focal weakness, seizures, weakness and headaches.  Endo/Heme/Allergies: Does not bruise/bleed easily.  Psychiatric/Behavioral: Negative for depression and suicidal ideas. The patient does not have insomnia.      No Known Allergies   Past Medical History:  Diagnosis Date  . Colon cancer ([facility])   . COPD (chronic obstructive pulmonary disease) ([place])   . Hyperlipidemia      Past Surgical History:  Procedure Laterality Date  . COLONOSCOPY WITH PROPOFOL N/A 06/26/2019   Procedure: COLONOSCOPY WITH PROPOFOL;  Surgeon: [name], MD;  Location[facility] ENDOSCOPY;  Service: Gastroenterology;  Laterality: N/A;  . PORTA CATH INSERTION N/A 06/25/2019   Procedure: PORTA CATH INSERTION;  Surgeon: Algernon Huxley, MD;  Location: [place] CV LAB;  Service: Cardiovascular;  Laterality: N/A;    Social History   Socioeconomic History  . Marital status: Single    Spouse name: Not on file  . Number of children: Not on file  . Years of education: Not on file  . Highest education level: Not on file  Occupational History  . Not on file  Tobacco Use  . Smoking status: Current Every Day Smoker  Packs/day: 0.50     Years: 40.00    Pack years: 20.00    Types: Cigarettes  . Smokeless tobacco: Never Used  . Tobacco comment: smoking 40 years  Substance and Sexual Activity  . Alcohol use: No  . Drug use: No  . Sexual activity: Not Currently  Other Topics Concern  . Not on file  Social History Narrative  . Not on file   Social Determinants of Health   Financial Resource Strain: Low Risk   . Difficulty of Paying Living Expenses: Not hard at all  Food Insecurity: No Food Insecurity  . Worried About Charity fundraiser in the Last Year: Never true  . Ran Out of Food in the Last Year: Never true  Transportation Needs: No Transportation Needs  . Lack of Transportation (Medical): No  . Lack of Transportation (Non-Medical): No  Physical Activity:   . Days of Exercise per Week: Not on file  . Minutes of Exercise per Session: Not on file  Stress: No Stress Concern Present  . Feeling of Stress : Not at all  Social Connections: Unknown  . Frequency of Communication with Friends and Family: More than three times a week  . Frequency of Social Gatherings with Friends and Family: Not on file  . Attends Religious Services: Not on file  . Active Member of Clubs or Organizations: Not on file  . Attends Archivist Meetings: Not on file  . Marital Status: Not on file  Intimate Partner Violence: Not At Risk  . Fear of Current or Ex-Partner: No  . Emotionally Abused: No  . Physically Abused: No  . Sexually Abused: No    Family History  Problem Relation Age of Onset  . Brain cancer Father      Current Outpatient Medications:  .  aspirin EC 81 MG tablet, Take 81 mg by mouth daily., Disp: , Rfl:  .  azelastine (ASTELIN) 0.1 % nasal spray, Place 1 spray into both nostrils 1 day or 1 dose., Disp: , Rfl:  .  cetirizine (ZYRTEC) 10 MG tablet, Take 1 tablet (10 mg total) by mouth daily. (Patient taking differently: Take 10 mg by mouth daily as needed. ), Disp: 30 tablet, Rfl: 0 .  dexamethasone  (DECADRON) 4 MG tablet, Take 2 tablets (8 mg total) by mouth daily. Start the day after chemotherapy for 2 days. Take with food., Disp: 30 tablet, Rfl: 1 .  fluticasone (VERAMYST) 27.5 MCG/SPRAY nasal spray, Place 2 sprays into the nose daily., Disp: , Rfl:  .  lidocaine-prilocaine (EMLA) cream, Apply to affected area once, Disp: 30 g, Rfl: 3 .  magic mouthwash w/lidocaine SOLN, Take 5-10 mLs by mouth 4 (four) times daily as needed for mouth pain. (Patient not taking: Reported on 11/19/2019), Disp: 480 mL, Rfl: 3 .  montelukast (SINGULAIR) 10 MG tablet, Take 10 mg by mouth at bedtime., Disp: , Rfl:  .  nicotine (NICODERM CQ - DOSED IN MG/24 HOURS) 21 mg/24hr patch, Place 1 patch onto the skin 1 day or 1 dose., Disp: , Rfl:  .  ondansetron (ZOFRAN) 8 MG tablet, Take 1 tablet (8 mg total) by mouth 2 (two) times daily as needed for refractory nausea / vomiting. Start on day 3 after chemotherapy., Disp: 30 tablet, Rfl: 1 .  pantoprazole (PROTONIX) 40 MG tablet, Take 40 mg by mouth daily., Disp: , Rfl:  .  potassium chloride SA (K-DUR) 20 MEQ tablet, Take 1 tablet (20 mEq total) by mouth daily., Disp:  14 tablet, Rfl: 0 .  prochlorperazine (COMPAZINE) 10 MG tablet, Take 1 tablet (10 mg total) by mouth every 6 (six) hours as needed (Nausea or vomiting)., Disp: 30 tablet, Rfl: 1 .  simvastatin (ZOCOR) 20 MG tablet, Take 20 mg by mouth daily., Disp: , Rfl:  .  sucralfate (CARAFATE) 1 g tablet, Take 1 g by mouth 4 (four) times daily., Disp: , Rfl:  .  tamsulosin (FLOMAX) 0.4 MG CAPS capsule, Take 0.4 mg by mouth., Disp: , Rfl:  .  traMADol-acetaminophen (ULTRACET) 37.5-325 MG tablet, Take 1 tablet by mouth every 6 (six) hours., Disp: , Rfl:  No current facility-administered medications for this visit.  Facility-Administered Medications Ordered in Other Visits:  .  sodium chloride flush (NS) 0.9 % injection 10 mL, 10 mL, Intracatheter, PRN, Sindy Guadeloupe, MD, 10 mL at 08/15/19 1405 .  sodium chloride flush  (NS) 0.9 % injection 10 mL, 10 mL, Intravenous, PRN, Sindy Guadeloupe, MD, 10 mL at 12/10/19 9597  Physical exam: There were no vitals filed for this visit. Physical Exam Constitutional:      General: He is not in acute distress. HENT:     Head: Normocephalic and atraumatic.  Eyes:     Pupils: Pupils are equal, round, and reactive to light.  Cardiovascular:     Rate and Rhythm: Normal rate and regular rhythm.     Heart sounds: Normal heart sounds.  Pulmonary:     Effort: Pulmonary effort is normal.     Breath sounds: Normal breath sounds.  Abdominal:     General: Bowel sounds are normal.     Palpations: Abdomen is soft.  Musculoskeletal:     Cervical back: Normal range of motion.  Skin:    General: Skin is warm and dry.  Neurological:     Mental Status: He is alert and oriented to person, place, and time.      CMP Latest Ref Rng & Units 12/10/2019  Glucose 70 - 99 mg/dL 99  BUN 8 - 23 mg/dL 33(H)  Creatinine 0.61 - 1.24 mg/dL 0.84  Sodium 135 - 145 mmol/L 136  Potassium 3.5 - 5.1 mmol/L 3.9  Chloride 98 - 111 mmol/L 103  CO2 22 - 32 mmol/L 24  Calcium 8.9 - 10.3 mg/dL 9.1  Total Protein 6.5 - 8.1 g/dL 7.4  Total Bilirubin 0.3 - 1.2 mg/dL 0.7  Alkaline Phos 38 - 126 U/L 126  AST 15 - 41 U/L 58(H)  ALT 0 - 44 U/L 52(H)   CBC Latest Ref Rng & Units 12/10/2019  WBC 4.0 - 10.5 K/uL 4.7  Hemoglobin 13.0 - 17.0 g/dL 14.4  Hematocrit 39.0 - 52.0 % 42.9  Platelets 150 - 400 K/uL 183    Assessment and plan- Patient is a 68 y.o. male with metastatic colon cancer TX NX M1 with generalized lymphadenopathy K-ras/beta wild type. He is here for on treatment assessment prior to cycle 11 of FOLFOX mvasi chemotherapy  Counts okay to proceed with cycle 11 of FOLFOX Mvasi chemotherapy today.  He will return to clinic in 3 weeks with CBC with differential, CMP and CEA for cycle 12.  Plan to repeat scans after 12 cycles.  Blood pressure is stable and urine protein is trace to receive Mvasi  today.  Mildly abnormal LFTs: Possibly sickDue to chemotherapy.  Continue to monitor  Nausea: Possibly secondary to chemotherapy and/or gastritis.  Reports symptoms are better since he started Dexilant.  He is also seeing GI today.  Visit Diagnosis 1. Encounter for antineoplastic chemotherapy   2. 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 ([place])   3. Chemotherapy-induced nausea      Dr. Randa Evens, MD, MPH [facility] [phone] 12/10/2019 1:34 PM

## 2019-12-10 NOTE — Progress Notes (Signed)
0915: Per Judeen Hammans RN per Dr. [name] pt needs to give urine sample and wait for results prior to giving Mvasi. Per Judeen Hammans RN per Dr. [name] proceed with Oxaliplatin and Leucovorin and give Mvasi at the end.

## 2019-12-11 LAB — CEA: CEA: 5.6 ng/mL — ABNORMAL HIGH (ref 0.0–4.7)

## 2019-12-12 ENCOUNTER — Inpatient Hospital Stay: Payer: Medicare HMO

## 2019-12-12 ENCOUNTER — Other Ambulatory Visit: Payer: Self-pay

## 2019-12-12 VITALS — BP 137/66 | HR 78 | Temp 97.6°F | Resp 20

## 2019-12-12 DIAGNOSIS — C189 Malignant neoplasm of colon, unspecified: Secondary | ICD-10-CM

## 2019-12-12 DIAGNOSIS — Z5112 Encounter for antineoplastic immunotherapy: Secondary | ICD-10-CM | POA: Diagnosis not present

## 2019-12-12 DIAGNOSIS — C772 Secondary and unspecified malignant neoplasm of intra-abdominal lymph nodes: Secondary | ICD-10-CM

## 2019-12-12 MED ORDER — SODIUM CHLORIDE 0.9% FLUSH
10.0000 mL | INTRAVENOUS | Status: DC | PRN
  Administered 2019-12-12: 10 mL
  Filled 2019-12-12: qty 10

## 2019-12-12 MED ORDER — HEPARIN SOD (PORK) LOCK FLUSH 100 UNIT/ML IV SOLN
INTRAVENOUS | Status: AC
  Filled 2019-12-12: qty 5

## 2019-12-12 MED ORDER — HEPARIN SOD (PORK) LOCK FLUSH 100 UNIT/ML IV SOLN
500.0000 [IU] | Freq: Once | INTRAVENOUS | Status: AC | PRN
  Administered 2019-12-12: 500 [IU]
  Filled 2019-12-12: qty 5

## 2019-12-13 ENCOUNTER — Inpatient Hospital Stay (HOSPITAL_BASED_OUTPATIENT_CLINIC_OR_DEPARTMENT_OTHER): Payer: Medicare HMO | Admitting: Hospice and Palliative Medicine

## 2019-12-13 DIAGNOSIS — Z515 Encounter for palliative care: Secondary | ICD-10-CM

## 2019-12-13 NOTE — Progress Notes (Signed)
I was unable to reach patient by phone. Will reschedule when patient is next in the clinic.

## 2019-12-31 ENCOUNTER — Inpatient Hospital Stay (HOSPITAL_BASED_OUTPATIENT_CLINIC_OR_DEPARTMENT_OTHER): Payer: Medicare HMO | Admitting: Oncology

## 2019-12-31 ENCOUNTER — Inpatient Hospital Stay: Payer: Medicare HMO | Attending: Oncology

## 2019-12-31 ENCOUNTER — Inpatient Hospital Stay: Payer: Medicare HMO | Attending: Hospice and Palliative Medicine | Admitting: Hospice and Palliative Medicine

## 2019-12-31 ENCOUNTER — Inpatient Hospital Stay: Payer: Medicare HMO

## 2019-12-31 ENCOUNTER — Other Ambulatory Visit: Payer: Self-pay

## 2019-12-31 ENCOUNTER — Encounter: Payer: Self-pay | Admitting: Oncology

## 2019-12-31 VITALS — BP 117/88 | HR 92 | Temp 98.3°F | Ht 67.0 in | Wt 139.9 lb

## 2019-12-31 DIAGNOSIS — Z5111 Encounter for antineoplastic chemotherapy: Secondary | ICD-10-CM

## 2019-12-31 DIAGNOSIS — C189 Malignant neoplasm of colon, unspecified: Secondary | ICD-10-CM | POA: Insufficient documentation

## 2019-12-31 DIAGNOSIS — C772 Secondary and unspecified malignant neoplasm of intra-abdominal lymph nodes: Secondary | ICD-10-CM | POA: Insufficient documentation

## 2019-12-31 DIAGNOSIS — R7989 Other specified abnormal findings of blood chemistry: Secondary | ICD-10-CM

## 2019-12-31 DIAGNOSIS — T451X5A Adverse effect of antineoplastic and immunosuppressive drugs, initial encounter: Secondary | ICD-10-CM | POA: Diagnosis not present

## 2019-12-31 DIAGNOSIS — G620 Drug-induced polyneuropathy: Secondary | ICD-10-CM | POA: Diagnosis not present

## 2019-12-31 DIAGNOSIS — Z452 Encounter for adjustment and management of vascular access device: Secondary | ICD-10-CM | POA: Diagnosis not present

## 2019-12-31 DIAGNOSIS — D701 Agranulocytosis secondary to cancer chemotherapy: Secondary | ICD-10-CM | POA: Insufficient documentation

## 2019-12-31 DIAGNOSIS — R202 Paresthesia of skin: Secondary | ICD-10-CM | POA: Insufficient documentation

## 2019-12-31 DIAGNOSIS — Z515 Encounter for palliative care: Secondary | ICD-10-CM

## 2019-12-31 DIAGNOSIS — R200 Anesthesia of skin: Secondary | ICD-10-CM | POA: Insufficient documentation

## 2019-12-31 DIAGNOSIS — R945 Abnormal results of liver function studies: Secondary | ICD-10-CM | POA: Diagnosis not present

## 2019-12-31 DIAGNOSIS — Z5112 Encounter for antineoplastic immunotherapy: Secondary | ICD-10-CM | POA: Diagnosis present

## 2019-12-31 LAB — COMPREHENSIVE METABOLIC PANEL
ALT: 64 U/L — ABNORMAL HIGH (ref 0–44)
AST: 74 U/L — ABNORMAL HIGH (ref 15–41)
Albumin: 3.5 g/dL (ref 3.5–5.0)
Alkaline Phosphatase: 134 U/L — ABNORMAL HIGH (ref 38–126)
Anion gap: 10 (ref 5–15)
BUN: 31 mg/dL — ABNORMAL HIGH (ref 8–23)
CO2: 22 mmol/L (ref 22–32)
Calcium: 8.8 mg/dL — ABNORMAL LOW (ref 8.9–10.3)
Chloride: 104 mmol/L (ref 98–111)
Creatinine, Ser: 0.85 mg/dL (ref 0.61–1.24)
GFR calc Af Amer: >60 mL/min (ref >60)
GFR calc non Af Amer: >60 mL/min (ref >60)
Glucose, Bld: 140 mg/dL — ABNORMAL HIGH (ref 70–99)
Potassium: 3.5 mmol/L (ref 3.5–5.1)
Sodium: 136 mmol/L (ref 135–145)
Total Bilirubin: 0.5 mg/dL (ref 0.3–1.2)
Total Protein: 7.3 g/dL (ref 6.5–8.1)

## 2019-12-31 LAB — CBC WITH DIFFERENTIAL/PLATELET
Abs Immature Granulocytes: 0.03 10*3/uL (ref 0.00–0.07)
Basophils Absolute: 0 10*3/uL (ref 0.0–0.1)
Basophils Relative: 1 %
Eosinophils Absolute: 0.1 10*3/uL (ref 0.0–0.5)
Eosinophils Relative: 2 %
HCT: 41 % (ref 39.0–52.0)
Hemoglobin: 13.5 g/dL (ref 13.0–17.0)
Immature Granulocytes: 1 %
Lymphocytes Relative: 47 %
Lymphs Abs: 1.9 10*3/uL (ref 0.7–4.0)
MCH: 31.9 pg (ref 26.0–34.0)
MCHC: 32.9 g/dL (ref 30.0–36.0)
MCV: 96.9 fL (ref 80.0–100.0)
Monocytes Absolute: 0.7 10*3/uL (ref 0.1–1.0)
Monocytes Relative: 17 %
Neutro Abs: 1.3 10*3/uL — ABNORMAL LOW (ref 1.7–7.7)
Neutrophils Relative %: 32 %
Platelets: 187 10*3/uL (ref 150–400)
RBC: 4.23 MIL/uL (ref 4.22–5.81)
RDW: 14.9 % (ref 11.5–15.5)
WBC: 4 10*3/uL (ref 4.0–10.5)
nRBC: 0 % (ref 0.0–0.2)

## 2019-12-31 LAB — PROTEIN, URINE, RANDOM: Total Protein, Urine: 17 mg/dL

## 2019-12-31 MED ORDER — SODIUM CHLORIDE 0.9 % IV SOLN
2400.0000 mg/m2 | INTRAVENOUS | Status: DC
  Administered 2019-12-31: 12:00:00 4300 mg via INTRAVENOUS
  Filled 2019-12-31: qty 86

## 2019-12-31 MED ORDER — FLUOROURACIL CHEMO INJECTION 2.5 GM/50ML
400.0000 mg/m2 | Freq: Once | INTRAVENOUS | Status: AC
  Administered 2019-12-31: 12:00:00 700 mg via INTRAVENOUS
  Filled 2019-12-31: qty 14

## 2019-12-31 MED ORDER — SODIUM CHLORIDE 0.9% FLUSH
10.0000 mL | Freq: Once | INTRAVENOUS | Status: AC
  Administered 2019-12-31: 09:00:00 10 mL via INTRAVENOUS
  Filled 2019-12-31: qty 10

## 2019-12-31 MED ORDER — PALONOSETRON HCL INJECTION 0.25 MG/5ML
0.2500 mg | Freq: Once | INTRAVENOUS | Status: AC
  Administered 2019-12-31: 11:00:00 0.25 mg via INTRAVENOUS
  Filled 2019-12-31: qty 5

## 2019-12-31 MED ORDER — DEXAMETHASONE SODIUM PHOSPHATE 10 MG/ML IJ SOLN
10.0000 mg | Freq: Once | INTRAMUSCULAR | Status: AC
  Administered 2019-12-31: 10:00:00 10 mg via INTRAVENOUS
  Filled 2019-12-31: qty 1

## 2019-12-31 MED ORDER — SODIUM CHLORIDE 0.9 % IV SOLN
325.0000 mg | Freq: Once | INTRAVENOUS | Status: AC
  Administered 2019-12-31: 325 mg via INTRAVENOUS
  Filled 2019-12-31: qty 13

## 2019-12-31 MED ORDER — LEUCOVORIN CALCIUM INJECTION 350 MG
700.0000 mg | Freq: Once | INTRAVENOUS | Status: AC
  Administered 2019-12-31: 700 mg via INTRAVENOUS
  Filled 2019-12-31: qty 35

## 2019-12-31 MED ORDER — SODIUM CHLORIDE 0.9 % IV SOLN
Freq: Once | INTRAVENOUS | Status: AC
  Filled 2019-12-31: qty 250

## 2019-12-31 NOTE — Progress Notes (Signed)
Patient stated that he had been doing well with no complaints. 

## 2019-12-31 NOTE — Progress Notes (Signed)
Per MD okay to proceed with treatment with ANC 1.3. Treatment team updated.   Eric Simon [employer]

## 2019-12-31 NOTE — Progress Notes (Signed)
Hematology/Oncology Consult note [facility]  Telephone:([phone] [phone]  Patient Care Team: Jodi Marble, MD as PCP - General (Internal Medicine) Clent Jacks, RN as Oncology Nurse Navigator   Name of the patient: Eric Simon  [id number]  [birth year]   Date of visit: 12/31/19  Diagnosis- metastatic colon cancer with lymph node metastases K-ras/BRAF wild-type  Chief complaint/ Reason for visit-on treatment assessment prior to cycle 12 of FOLFOX Mvasi chemotherapy  Heme/Onc history: Patient is a 69 yr old male with >40 pack year history of smoking. He currently smokes 0.5ppd. he presented to the ER with symptoms of left sided chest pain and left arm pain. Troponin was negative ekg was unremarkable. Ct chest showed no PE. He was incidentally noted to have mediastinal and retrocrural adenopathy and a 2.1X2.3X4.4 cm retroaortic soft tissue lesion in the posterior left chest. 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 Low-grade activity in the left lateral fifth and sixth ribs associated with nondisplaced fractures likely reflecting healing response problem malignancy.  Patient underwent CT-guided biopsy of the retroperitoneal lymph node pathology showed metastatic adenocarcinoma compatible with colorectal origin. CK7 negative. CK20 positive. CDX 2+. TTF-1 negative. PSA negative. This pattern of immunoreactivity supports the above diagnosis. Patient underwent colonoscopy which showed sigmoid mass that was consistent with adenocarcinoma. Rast panel testing showed that he was wild-type for both K-ras and BRAF  Interval history-reports that his nausea is better and he is eating better.  Reports some tingling numbness in his hands and feet.  ECOG PS- 1 Pain scale- 0 Opioid associated constipation- no  Review of  systems- Review of Systems  Constitutional: Positive for malaise/fatigue. Negative for chills, fever and weight loss.  HENT: Negative for congestion, ear discharge and nosebleeds.   Eyes: Negative for blurred vision.  Respiratory: Negative for cough, hemoptysis, sputum production, shortness of breath and wheezing.   Cardiovascular: Negative for chest pain, palpitations, orthopnea and claudication.  Gastrointestinal: Negative for abdominal pain, blood in stool, constipation, diarrhea, heartburn, melena, nausea and vomiting.  Genitourinary: Negative for dysuria, flank pain, frequency, hematuria and urgency.  Musculoskeletal: Negative for back pain, joint pain and myalgias.  Skin: Negative for rash.  Neurological: Positive for sensory change (Peripheral neuropathy). Negative for dizziness, tingling, focal weakness, seizures, weakness and headaches.  Endo/Heme/Allergies: Does not bruise/bleed easily.  Psychiatric/Behavioral: Negative for depression and suicidal ideas. The patient does not have insomnia.       No Known Allergies   Past Medical History:  Diagnosis Date  . Colon cancer ([place])   . COPD (chronic obstructive pulmonary disease) ([place])   . Hyperlipidemia      Past Surgical History:  Procedure Laterality Date  . COLONOSCOPY WITH PROPOFOL N/A 06/26/2019   Procedure: COLONOSCOPY WITH PROPOFOL;  Surgeon: [name], MD;  Location: [facility] ENDOSCOPY;  Service: Gastroenterology;  Laterality: N/A;  . PORTA CATH INSERTION N/A 06/25/2019   Procedure: PORTA CATH INSERTION;  Surgeon: Algernon Huxley, MD;  Location: [place] CV LAB;  Service: Cardiovascular;  Laterality: N/A;    Social History   Socioeconomic History  . Marital status: Single    Spouse name: Not on file  . Number of children: Not on file  . Years of education: Not on file  . Highest education level: Not on file  Occupational History  . Not on file  Tobacco Use  . Smoking status: Current Every  Day Smoker     Packs/day: 0.50    Years: 40.00    Pack years: 20.00    Types: Cigarettes  . Smokeless tobacco: Never Used  . Tobacco comment: smoking 40 years  Substance and Sexual Activity  . Alcohol use: No  . Drug use: No  . Sexual activity: Not Currently  Other Topics Concern  . Not on file  Social History Narrative  . Not on file   Social Determinants of Health   Financial Resource Strain: Low Risk   . Difficulty of Paying Living Expenses: Not hard at all  Food Insecurity: No Food Insecurity  . Worried About Charity fundraiser in the Last Year: Never true  . Ran Out of Food in the Last Year: Never true  Transportation Needs: No Transportation Needs  . Lack of Transportation (Medical): No  . Lack of Transportation (Non-Medical): No  Physical Activity:   . Days of Exercise per Week: Not on file  . Minutes of Exercise per Session: Not on file  Stress: No Stress Concern Present  . Feeling of Stress : Not at all  Social Connections: Unknown  . Frequency of Communication with Friends and Family: More than three times a week  . Frequency of Social Gatherings with Friends and Family: Not on file  . Attends Religious Services: Not on file  . Active Member of Clubs or Organizations: Not on file  . Attends Archivist Meetings: Not on file  . Marital Status: Not on file  Intimate Partner Violence: Not At Risk  . Fear of Current or Ex-Partner: No  . Emotionally Abused: No  . Physically Abused: No  . Sexually Abused: No    Family History  Problem Relation Age of Onset  . Brain cancer Father      Current Outpatient Medications:  .  aspirin EC 81 MG tablet, Take 81 mg by mouth daily., Disp: , Rfl:  .  azelastine (ASTELIN) 0.1 % nasal spray, Place 1 spray into both nostrils 1 day or 1 dose., Disp: , Rfl:  .  cetirizine (ZYRTEC) 10 MG tablet, Take 1 tablet (10 mg total) by mouth daily. (Patient taking differently: Take 10 mg by mouth daily as needed. ), Disp: 30 tablet, Rfl: 0 .   chlorthalidone (HYGROTON) 25 MG tablet, Take 1 tablet by mouth daily., Disp: , Rfl:  .  dexamethasone (DECADRON) 4 MG tablet, Take 2 tablets (8 mg total) by mouth daily. Start the day after chemotherapy for 2 days. Take with food., Disp: 30 tablet, Rfl: 1 .  DEXILANT 60 MG capsule, Take 1 capsule by mouth 1 day or 1 dose., Disp: , Rfl:  .  fluticasone (FLONASE) 50 MCG/ACT nasal spray, Place 2 sprays into both nostrils 1 day or 1 dose., Disp: , Rfl:  .  fluticasone (VERAMYST) 27.5 MCG/SPRAY nasal spray, Place 2 sprays into the nose daily., Disp: , Rfl:  .  lidocaine-prilocaine (EMLA) cream, Apply to affected area once, Disp: 30 g, Rfl: 3 .  magic mouthwash w/lidocaine SOLN, Take 5-10 mLs by mouth 4 (four) times daily as needed for mouth pain., Disp: 480 mL, Rfl: 3 .  montelukast (SINGULAIR) 10 MG tablet, Take 10 mg by mouth at bedtime., Disp: , Rfl:  .  nicotine (NICODERM CQ - DOSED IN MG/24 HOURS) 21 mg/24hr patch, Place 1 patch onto the skin 1 day or 1 dose., Disp: , Rfl:  .  ondansetron (ZOFRAN) 8 MG tablet, Take 1 tablet (8 mg total) by  mouth 2 (two) times daily as needed for refractory nausea / vomiting. Start on day 3 after chemotherapy., Disp: 30 tablet, Rfl: 1 .  pantoprazole (PROTONIX) 40 MG tablet, Take 40 mg by mouth daily., Disp: , Rfl:  .  potassium chloride SA (K-DUR) 20 MEQ tablet, Take 1 tablet (20 mEq total) by mouth daily., Disp: 14 tablet, Rfl: 0 .  prochlorperazine (COMPAZINE) 10 MG tablet, Take 1 tablet (10 mg total) by mouth every 6 (six) hours as needed (Nausea or vomiting)., Disp: 30 tablet, Rfl: 1 .  simvastatin (ZOCOR) 20 MG tablet, Take 20 mg by mouth daily., Disp: , Rfl:  .  sucralfate (CARAFATE) 1 g tablet, Take 1 g by mouth 4 (four) times daily., Disp: , Rfl:  .  SYMBICORT 80-4.5 MCG/ACT inhaler, Inhale 2 puffs into the lungs 1 day or 1 dose., Disp: , Rfl:  .  tamsulosin (FLOMAX) 0.4 MG CAPS capsule, Take 0.4 mg by mouth., Disp: , Rfl:  .  traMADol-acetaminophen  (ULTRACET) 37.5-325 MG tablet, Take 1 tablet by mouth every 6 (six) hours., Disp: , Rfl:  No current facility-administered medications for this visit.  Facility-Administered Medications Ordered in Other Visits:  .  fluorouracil (ADRUCIL) 4,300 mg in sodium chloride 0.9 % 64 mL chemo infusion, 2,400 mg/m2 (Treatment Plan Recorded), Intravenous, 1 day or 1 dose, Sindy Guadeloupe, MD .  fluorouracil (ADRUCIL) chemo injection 700 mg, 400 mg/m2 (Treatment Plan Recorded), Intravenous, Once, Sindy Guadeloupe, MD .  leucovorin 700 mg in dextrose 5 % 250 mL infusion, 700 mg, Intravenous, Once, Sindy Guadeloupe, MD, Last Rate: 570 mL/hr at 12/31/19 1142, 700 mg at 12/31/19 1142 .  sodium chloride flush (NS) 0.9 % injection 10 mL, 10 mL, Intracatheter, PRN, Sindy Guadeloupe, MD, 10 mL at 08/15/19 1405  Physical exam:  Vitals:   12/31/19 0910  BP: 117/88  Pulse: 92  Temp: 98.3 F (36.8 C)  TempSrc: Tympanic  Weight: 139 lb 14.4 oz (63.5 kg)  Height: _0  (1.702 m)   Physical Exam Constitutional:      General: He is not in acute distress. HENT:     Head: Normocephalic and atraumatic.  Eyes:     Pupils: Pupils are equal, round, and reactive to light.  Cardiovascular:     Rate and Rhythm: Normal rate and regular rhythm.     Heart sounds: Normal heart sounds.  Pulmonary:     Effort: Pulmonary effort is normal.     Breath sounds: Normal breath sounds.  Abdominal:     General: Bowel sounds are normal.     Palpations: Abdomen is soft.  Musculoskeletal:     Cervical back: Normal range of motion.  Skin:    General: Skin is warm and dry.  Neurological:     Mental Status: He is alert and oriented to person, place, and time.      CMP Latest Ref Rng & Units 12/31/2019  Glucose 70 - 99 mg/dL 140(H)  BUN 8 - 23 mg/dL 31(H)  Creatinine 0.61 - 1.24 mg/dL 0.85  Sodium 135 - 145 mmol/L 136  Potassium 3.5 - 5.1 mmol/L 3.5  Chloride 98 - 111 mmol/L 104  CO2 22 - 32 mmol/L 22  Calcium 8.9 - 10.3 mg/dL  8.8(L)  Total Protein 6.5 - 8.1 g/dL 7.3  Total Bilirubin 0.3 - 1.2 mg/dL 0.5  Alkaline Phos 38 - 126 U/L 134(H)  AST 15 - 41 U/L 74(H)  ALT 0 - 44 U/L 64(H)   CBC  Latest Ref Rng & Units 12/31/2019  WBC 4.0 - 10.5 K/uL 4.0  Hemoglobin 13.0 - 17.0 g/dL 13.5  Hematocrit 39.0 - 52.0 % 41.0  Platelets 150 - 400 K/uL 187     Assessment and plan- Patient is a 69 y.o. male with metastatic colon cancer TX NX M1 with generalized lymphadenopathy K-ras/beta wild type.   He is here for on treatment assessment prior to cycle 12 of FOLFOX Mvasi chemotherapy  Patient has mildly elevated AST and ALT at 74 and 64 respectively which has been slowly trending up over the last 2 treatments.  He also reports developing tingling numbness in his hands and feet.  For these reasons I will not be giving him oxaliplatin today and he will proceed with 5-FU alone along with Mvasi.  Patient's white cell count is 4 today with an ANC of 1.3.  This is despite giving him the last chemotherapy 3 weeks ago.  I suspect he will run into more problems with neutropenia given his age and the need for requiring chemotherapy until progression or toxicity.  I would therefore like to give him the Udenyca on day 3 of pump disconnect to prevent neutropenia associated complications.  Abnormal LFTs: Secondary to oxaliplatin.  Hold off on giving oxaliplatin at this time  Chemo-induced peripheral neuropathy: Mild grade 1.  Continue to monitor oxaliplatin has been discontinued  I will see him back in 3 weeks time with CBC with differential, CMP for cycle 13 of 5-FU Mvasi chemotherapy.  He will need CT chest and abdomen and pelvis with contrast prior   Visit Diagnosis 1. Encounter for antineoplastic chemotherapy   2. Chemotherapy induced neutropenia ([facility])   3. 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 ([place])   4. Abnormal LFTs      Dr. Randa Evens, MD, MPH [facility] [phone] 12/31/2019  12:09 PM

## 2019-12-31 NOTE — Progress Notes (Signed)
[facility]  [phone] [phone]   Name: Eric Simon Date: 12/31/2019 MRN: [id number]  DOB: Feb 01, 1951  Patient Care Team: Jodi Marble, MD as PCP - General (Internal Medicine) Clent Jacks, RN as Oncology Nurse Navigator    REASON FOR CONSULTATION: [name] is a 69 y.o. 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  Patient initiated treatment with FOLFOX Avastin chemotherapy.  He was referred to palliative care to discuss goals and manage ongoing symptoms.  SOCIAL HISTORY:     reports that he has been smoking cigarettes. He has a 20.00 pack-year smoking history. He has never used smokeless tobacco. He reports that he does not drink alcohol or use drugs.   Patient is divorced.  He has no children.  He lives at home and rents a room to a friend.  Patient is originally from [place] but has lived here for 30 years.  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  ADVANCE DIRECTIVES:  Living will but not on file  CODE STATUS: DNR/DNI (MOST Form completed on 09/24/2019)  PAST MEDICAL HISTORY: Past Medical History:  Diagnosis Date  . Colon cancer ([place])   . COPD (chronic obstructive pulmonary disease) ([place])   . Hyperlipidemia     PAST SURGICAL HISTORY:  Past Surgical History:  Procedure Laterality Date  . COLONOSCOPY WITH PROPOFOL N/A 06/26/2019   Procedure: COLONOSCOPY WITH PROPOFOL;  Surgeon: [name], MD;  Location: [facility]COPY;  Service: Gastroenterology;  Laterality: N/A;  . PORTA CATH INSERTION N/A 06/25/2019   Procedure: PORTA CATH INSERTION;  Surgeon: Algernon Huxley, MD;  Location: [facility];  Service: Cardiovascular;  Laterality: N/A;    HEMATOLOGY/ONCOLOGY HISTORY:  Oncology History  Colon cancer  metastasized to intra-abdominal lymph node ([place])  06/19/2019 Cancer Staging   Staging form: Colon and Rectum, AJCC 8th Edition - Clinical stage from 06/19/2019: Stage Unknown (cTX, cNX, pM1) - Signed by Sindy Guadeloupe, MD on 06/20/2019   06/20/2019 Initial Diagnosis   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 ([place])   07/02/2019 -  Chemotherapy   The patient had palonosetron (ALOXI) injection 0.25 mg, 0.25 mg, Intravenous,  Once, 11 of 15 cycles Administration: 0.25 mg (07/02/2019), 0.25 mg (07/16/2019), 0.25 mg (07/30/2019), 0.25 mg (08/13/2019), 0.25 mg (08/27/2019), 0.25 mg (09/10/2019), 0.25 mg (09/24/2019), 0.25 mg (10/15/2019), 0.25 mg (10/29/2019), 0.25 mg (11/19/2019), 0.25 mg (12/10/2019) pegfilgrastim-cbqv (UDENYCA) injection 6 mg, 6 mg, Subcutaneous, Once, 0 of 4 cycles bevacizumab (AVASTIN) 350 mg in sodium chloride 0.9 % 100 mL chemo infusion, 5 mg/kg = 350 mg, Intravenous,  Once, 5 of 5 cycles Administration: 350 mg (07/02/2019), 350 mg (07/16/2019), 350 mg (07/30/2019), 350 mg (08/13/2019), 350 mg (08/27/2019) leucovorin 700 mg in dextrose 5 % 250 mL infusion, 391 mg/m2 = 716 mg, Intravenous,  Once, 11 of 15 cycles Administration: 700 mg (07/02/2019), 700 mg (07/30/2019), 700 mg (08/13/2019), 700 mg (08/27/2019), 700 mg (09/10/2019), 700 mg (09/24/2019), 700 mg (10/15/2019), 700 mg (10/29/2019), 700 mg (11/19/2019), 700 mg (12/10/2019) oxaliplatin (ELOXATIN) 150 mg in dextrose 5 % 500 mL chemo infusion, 85 mg/m2 = 150 mg, Intravenous,  Once, 11 of 11 cycles Dose modification: 65 mg/m2 (original dose 85 mg/m2, Cycle 3, Reason: Other (see comments), Comment: patient age and tolerance) Administration: 150 mg (07/02/2019), 150 mg (07/16/2019), 115 mg (07/30/2019), 115  mg (08/13/2019), 115 mg (08/27/2019), 115 mg (09/10/2019), 115 mg (09/24/2019), 115 mg (10/15/2019), 115 mg (10/29/2019), 115 mg (11/19/2019), 115 mg (12/10/2019) fluorouracil (ADRUCIL) chemo injection 700 mg, 400 mg/m2 = 700 mg, Intravenous,  Once,  11 of 15 cycles Administration: 700 mg (07/02/2019), 700 mg (07/16/2019), 700 mg (07/30/2019), 700 mg (08/13/2019), 700 mg (08/27/2019), 700 mg (09/10/2019), 700 mg (09/24/2019), 700 mg (10/15/2019), 700 mg (10/29/2019), 700 mg (11/19/2019), 700 mg (12/10/2019) fluorouracil (ADRUCIL) 4,300 mg in sodium chloride 0.9 % 64 mL chemo infusion, 2,400 mg/m2 = 4,300 mg, Intravenous, 1 Day/Dose, 11 of 15 cycles Administration: 4,300 mg (07/02/2019), 4,300 mg (07/16/2019), 4,300 mg (07/30/2019), 4,300 mg (08/13/2019), 4,300 mg (08/27/2019), 4,300 mg (09/10/2019), 4,300 mg (09/24/2019), 4,300 mg (10/15/2019), 4,300 mg (10/29/2019), 4,300 mg (11/19/2019), 4,300 mg (12/10/2019) bevacizumab-awwb (MVASI) 350 mg in sodium chloride 0.9 % 100 mL chemo infusion, 5 mg/kg = 350 mg (100 % of original dose 5 mg/kg), Intravenous,  Once, 6 of 10 cycles Dose modification: 5 mg/kg (original dose 5 mg/kg, Cycle 6) Administration: 350 mg (09/10/2019), 350 mg (10/15/2019), 350 mg (10/29/2019), 350 mg (11/19/2019), 325 mg (12/10/2019)  for chemotherapy treatment.      ALLERGIES:  has No Known Allergies.  MEDICATIONS:  Current Outpatient Medications  Medication Sig Dispense Refill  . aspirin EC 81 MG tablet Take 81 mg by mouth daily.    [name] azelastine (ASTELIN) 0.1 % nasal spray Place 1 spray into both nostrils 1 day or 1 dose.    . cetirizine (ZYRTEC) 10 MG tablet Take 1 tablet (10 mg total) by mouth daily. (Patient taking differently: Take 10 mg by mouth daily as needed. ) 30 tablet 0  . chlorthalidone (HYGROTON) 25 MG tablet Take 1 tablet by mouth daily.    [name] dexamethasone (DECADRON) 4 MG tablet Take 2 tablets (8 mg total) by mouth daily. Start the day after chemotherapy for 2 days. Take with food. 30 tablet 1  . DEXILANT 60 MG capsule Take 1 capsule by mouth 1 day or 1 dose.    . fluticasone (FLONASE) 50 MCG/ACT nasal spray Place 2 sprays into both nostrils 1 day or 1 dose.    . fluticasone (VERAMYST) 27.5 MCG/SPRAY nasal spray Place 2 sprays  into the nose daily.    [name] lidocaine-prilocaine (EMLA) cream Apply to affected area once 30 g 3  . magic mouthwash w/lidocaine SOLN Take 5-10 mLs by mouth 4 (four) times daily as needed for mouth pain. 480 mL 3  . montelukast (SINGULAIR) 10 MG tablet Take 10 mg by mouth at bedtime.    . nicotine (NICODERM CQ - DOSED IN MG/24 HOURS) 21 mg/24hr patch Place 1 patch onto the skin 1 day or 1 dose.    . ondansetron (ZOFRAN) 8 MG tablet Take 1 tablet (8 mg total) by mouth 2 (two) times daily as needed for refractory nausea / vomiting. Start on day 3 after chemotherapy. 30 tablet 1  . pantoprazole (PROTONIX) 40 MG tablet Take 40 mg by mouth daily.    . potassium chloride SA (K-DUR) 20 MEQ tablet Take 1 tablet (20 mEq total) by mouth daily. 14 tablet 0  . prochlorperazine (COMPAZINE) 10 MG tablet Take 1 tablet (10 mg total) by mouth every 6 (six) hours as needed (Nausea or vomiting). 30 tablet 1  . simvastatin (ZOCOR) 20 MG tablet Take 20 mg by mouth daily.    . sucralfate (CARAFATE) 1 g tablet Take 1 g by mouth 4 (four) times daily.    Dellis Anes  80-4.5 MCG/ACT inhaler Inhale 2 puffs into the lungs 1 day or 1 dose.    . tamsulosin (FLOMAX) 0.4 MG CAPS capsule Take 0.4 mg by mouth.    . traMADol-acetaminophen (ULTRACET) 37.5-325 MG tablet Take 1 tablet by mouth every 6 (six) hours.     No current facility-administered medications for this visit.   Facility-Administered Medications Ordered in Other Visits  Medication Dose Route Frequency Provider Last Rate Last Admin  . sodium chloride flush (NS) 0.9 % injection 10 mL  10 mL Intracatheter PRN Sindy Guadeloupe, MD   10 mL at 08/15/19 1405    VITAL SIGNS: There were no vitals taken for this visit. There were no vitals filed for this visit.  Estimated body mass index is 21.91 kg/m as calculated from the following:   Height as of an earlier encounter on 12/31/19: 5\' 7"  (1.702 m).   Weight as of an earlier encounter on 12/31/19: 139 lb 14.4 oz (63.5  kg).  LABS: CBC:    Component Value Date/Time   WBC 4.0 12/31/2019 0839   HGB 13.5 12/31/2019 0839   HGB 16.1 08/18/2012 1321   HCT 41.0 12/31/2019 0839   HCT 45.4 08/18/2012 1321   PLT 187 12/31/2019 0839   PLT 192 08/18/2012 1321   MCV 96.9 12/31/2019 0839   MCV 88 08/18/2012 1321   NEUTROABS 1.3 (L) 12/31/2019 0839   LYMPHSABS 1.9 12/31/2019 0839   MONOABS 0.7 12/31/2019 0839   EOSABS 0.1 12/31/2019 0839   BASOSABS 0.0 12/31/2019 0839   Comprehensive Metabolic Panel:    Component Value Date/Time   NA 136 12/31/2019 0839   NA 139 08/18/2012 1321   K 3.5 12/31/2019 0839   K 3.7 08/18/2012 1321   CL 104 12/31/2019 0839   CL 104 08/18/2012 1321   CO2 22 12/31/2019 0839   CO2 27 08/18/2012 1321   BUN 31 (H) 12/31/2019 0839   BUN 18 08/18/2012 1321   CREATININE 0.85 12/31/2019 0839   CREATININE 0.82 08/18/2012 1321   GLUCOSE 140 (H) 12/31/2019 0839   GLUCOSE 68 08/18/2012 1321   CALCIUM 8.8 (L) 12/31/2019 0839   CALCIUM 9.0 08/18/2012 1321   AST 74 (H) 12/31/2019 0839   AST 36 08/18/2012 1321   ALT 64 (H) 12/31/2019 0839   ALT 51 08/18/2012 1321   ALKPHOS 134 (H) 12/31/2019 0839   ALKPHOS 88 08/18/2012 1321   BILITOT 0.5 12/31/2019 0839   BILITOT 0.4 08/18/2012 1321   PROT 7.3 12/31/2019 0839   PROT 8.0 08/18/2012 1321   ALBUMIN 3.5 12/31/2019 0839   ALBUMIN 4.1 08/18/2012 1321    RADIOGRAPHIC STUDIES: No results found.  PERFORMANCE STATUS (ECOG) : 0 - Asymptomatic  Review of Systems Unless otherwise noted, a complete review of systems is negative.  Physical Exam General: NAD, frail appearing, thin Pulmonary: Unlabored Extremities: no edema Skin: no rashes Neurological: Weakness but otherwise nonfocal  IMPRESSION: Routine follow-up visit today.   Patient reports that he is doing well.  He denies any significant changes or concerns.  He also denies any distressing symptoms today.  Patient was having nausea but says that symptoms are significantly  better after starting Dexilant.  His oral intake is reportedly good and weight has been stable.  PLAN: -Continue current scope of treatment -Follow-up telephone visit in 4 weeks   Patient expressed understanding and was in agreement with this plan. He also understands that He can call the clinic at any time with any questions, concerns, or complaints.  Time Total: 15 minutes  Visit consisted of counseling and education dealing with the complex and emotionally intense issues of symptom management and palliative care in the setting of serious and potentially life-threatening illness.Greater than 50%  of this time was spent counseling and coordinating care related to the above assessment and plan.  Signed by: Altha Harm, PhD, NP-C [phone] (Work Cell)

## 2020-01-01 LAB — CEA: CEA: 5 ng/mL — ABNORMAL HIGH (ref 0.0–4.7)

## 2020-01-02 ENCOUNTER — Other Ambulatory Visit: Payer: Self-pay

## 2020-01-02 ENCOUNTER — Inpatient Hospital Stay: Payer: Medicare HMO

## 2020-01-02 DIAGNOSIS — C189 Malignant neoplasm of colon, unspecified: Secondary | ICD-10-CM

## 2020-01-02 DIAGNOSIS — Z5112 Encounter for antineoplastic immunotherapy: Secondary | ICD-10-CM | POA: Diagnosis not present

## 2020-01-02 DIAGNOSIS — C772 Secondary and unspecified malignant neoplasm of intra-abdominal lymph nodes: Secondary | ICD-10-CM

## 2020-01-02 MED ORDER — SODIUM CHLORIDE 0.9% FLUSH
10.0000 mL | INTRAVENOUS | Status: DC | PRN
  Administered 2020-01-02: 10 mL
  Filled 2020-01-02: qty 10

## 2020-01-02 MED ORDER — HEPARIN SOD (PORK) LOCK FLUSH 100 UNIT/ML IV SOLN
500.0000 [IU] | Freq: Once | INTRAVENOUS | Status: AC | PRN
  Administered 2020-01-02: 500 [IU]
  Filled 2020-01-02: qty 5

## 2020-01-02 MED ORDER — HEPARIN SOD (PORK) LOCK FLUSH 100 UNIT/ML IV SOLN
INTRAVENOUS | Status: AC
  Filled 2020-01-02: qty 5

## 2020-01-02 MED ORDER — PEGFILGRASTIM-CBQV 6 MG/0.6ML ~~LOC~~ SOSY
6.0000 mg | PREFILLED_SYRINGE | Freq: Once | SUBCUTANEOUS | Status: AC
  Administered 2020-01-02: 6 mg via SUBCUTANEOUS
  Filled 2020-01-02: qty 0.6

## 2020-01-09 ENCOUNTER — Ambulatory Visit (INDEPENDENT_AMBULATORY_CARE_PROVIDER_SITE_OTHER): Payer: Medicare HMO | Admitting: Gastroenterology

## 2020-01-09 ENCOUNTER — Encounter: Payer: Self-pay | Admitting: Gastroenterology

## 2020-01-09 DIAGNOSIS — R142 Eructation: Secondary | ICD-10-CM

## 2020-01-09 DIAGNOSIS — K5900 Constipation, unspecified: Secondary | ICD-10-CM | POA: Diagnosis not present

## 2020-01-09 DIAGNOSIS — R140 Abdominal distension (gaseous): Secondary | ICD-10-CM | POA: Diagnosis not present

## 2020-01-09 DIAGNOSIS — Z87891 Personal history of nicotine dependence: Secondary | ICD-10-CM

## 2020-01-09 NOTE — Progress Notes (Signed)
Eric Simon , MD [address]  [place]  [place], [place] [address]  Main: [phone]  Fax: [phone]   Primary Care Physician: Jodi Marble, MD  Virtual Visit via Telephone Note  I connected with patient on 01/09/20 at 11:15 AM EST by telephone and verified that I am speaking with the correct person using two identifiers.   I discussed the limitations, risks, security and privacy concerns of performing an evaluation and management service by telephone and the availability of in person appointments. I also discussed with the patient that there may be a patient responsible charge related to this service. The patient expressed understanding and agreed to proceed.  Location of Patient: Home Location of Provider: Home Persons involved: Patient and provider only   History of Present Illness: Chief Complaint  Patient presents with  . Bloated    Patient states the abdominal bloating has improved. Patient is having some constipation and nausea     HPI: Eric Simon is a 69 y.o. male    Summary of history : Initially referred and seen in December 2020 for nausea and indigestion.  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  I have performed a colonoscopy which showed sigmoid mass consistent with adenocarcinoma.  No obstructive lesion seen on colonoscopy in July 2020.  Undergoing treatment with oncology.  CT scan of the abdomen in October 2020 demonstrated improving lymphadenopathy in chest abdomen and pelvis.  No colon mass seen in the patient.  He has a 40-pack-year of smoking.  12/10/2019: Hemoglobin 14.4 g, mild elevated AST and ALT of 58 and 52.been commenced on Dexilant for nausea.  Presently undergoing chemotherapy.   Main complaint today is bloating belching.  Has improved significantly after starting on Dexilant.  He is a smoker.  He suffers from possible constipation as he says he has a hard bowel movement daily.  Not  taking any medications for the constipation.   Interval history   12/10/2019-01/09/2020  MiraLAX did not help much with constipation.  Bloating was better for a while but then when he had his chemotherapy it returned.  Current Outpatient Medications  Medication Sig Dispense Refill  . aspirin EC 81 MG tablet Take 81 mg by mouth daily.    [name] azelastine (ASTELIN) 0.1 % nasal spray Place 1 spray into both nostrils 1 day or 1 dose.    . cetirizine (ZYRTEC) 10 MG tablet Take 1 tablet (10 mg total) by mouth daily. (Patient taking differently: Take 10 mg by mouth daily as needed. ) 30 tablet 0  . chlorthalidone (HYGROTON) 25 MG tablet Take 1 tablet by mouth daily.    [name] dexamethasone (DECADRON) 4 MG tablet Take 2 tablets (8 mg total) by mouth daily. Start the day after chemotherapy for 2 days. Take with food. 30 tablet 1  . DEXILANT 60 MG capsule Take 1 capsule by mouth 1 day or 1 dose.    . fluticasone (FLONASE) 50 MCG/ACT nasal spray Place 2 sprays into both nostrils 1 day or 1 dose.    . fluticasone (VERAMYST) 27.5 MCG/SPRAY nasal spray Place 2 sprays into the nose daily.    [name] lidocaine-prilocaine (EMLA) cream Apply to affected area once 30 g 3  . magic mouthwash w/lidocaine SOLN Take 5-10 mLs by mouth 4 (four) times daily as needed for mouth pain. 480 mL 3  . montelukast (SINGULAIR) 10 MG tablet Take 10 mg by mouth at bedtime.    . nicotine (  NICODERM CQ - DOSED IN MG/24 HOURS) 21 mg/24hr patch Place 1 patch onto the skin 1 day or 1 dose.    . ondansetron (ZOFRAN) 8 MG tablet Take 1 tablet (8 mg total) by mouth 2 (two) times daily as needed for refractory nausea / vomiting. Start on day 3 after chemotherapy. 30 tablet 1  . pantoprazole (PROTONIX) 40 MG tablet Take 40 mg by mouth daily.    . potassium chloride SA (K-DUR) 20 MEQ tablet Take 1 tablet (20 mEq total) by mouth daily. 14 tablet 0  . prochlorperazine (COMPAZINE) 10 MG tablet Take 1 tablet (10 mg total) by mouth every 6 (six) hours as needed  (Nausea or vomiting). 30 tablet 1  . simvastatin (ZOCOR) 20 MG tablet Take 20 mg by mouth daily.    . sucralfate (CARAFATE) 1 g tablet Take 1 g by mouth 4 (four) times daily.    . SYMBICORT 80-4.5 MCG/ACT inhaler Inhale 2 puffs into the lungs 1 day or 1 dose.    . tamsulosin (FLOMAX) 0.4 MG CAPS capsule Take 0.4 mg by mouth.    . traMADol-acetaminophen (ULTRACET) 37.5-325 MG tablet Take 1 tablet by mouth every 6 (six) hours.     No current facility-administered medications for this visit.   Facility-Administered Medications Ordered in Other Visits  Medication Dose Route Frequency Provider Last Rate Last Admin  . sodium chloride flush (NS) 0.9 % injection 10 mL  10 mL Intracatheter PRN Sindy Guadeloupe, MD   10 mL at 08/15/19 1405    Allergies as of 01/09/2020  . (No Known Allergies)    Review of Systems:    All systems reviewed and negative except where noted in HPI.   Observations/Objective:  Labs: CMP     Component Value Date/Time   NA 136 12/31/2019 0839   NA 139 08/18/2012 1321   K 3.5 12/31/2019 0839   K 3.7 08/18/2012 1321   CL 104 12/31/2019 0839   CL 104 08/18/2012 1321   CO2 22 12/31/2019 0839   CO2 27 08/18/2012 1321   GLUCOSE 140 (H) 12/31/2019 0839   GLUCOSE 68 08/18/2012 1321   BUN 31 (H) 12/31/2019 0839   BUN 18 08/18/2012 1321   CREATININE 0.85 12/31/2019 0839   CREATININE 0.82 08/18/2012 1321   CALCIUM 8.8 (L) 12/31/2019 0839   CALCIUM 9.0 08/18/2012 1321   PROT 7.3 12/31/2019 0839   PROT 8.0 08/18/2012 1321   ALBUMIN 3.5 12/31/2019 0839   ALBUMIN 4.1 08/18/2012 1321   AST 74 (H) 12/31/2019 0839   AST 36 08/18/2012 1321   ALT 64 (H) 12/31/2019 0839   ALT 51 08/18/2012 1321   ALKPHOS 134 (H) 12/31/2019 0839   ALKPHOS 88 08/18/2012 1321   BILITOT 0.5 12/31/2019 0839   BILITOT 0.4 08/18/2012 1321   GFRNONAA >60 12/31/2019 0839   GFRNONAA >60 08/18/2012 1321   GFRAA >60 12/31/2019 0839   GFRAA >60 08/18/2012 1321   Lab Results  Component Value  Date   WBC 4.0 12/31/2019   HGB 13.5 12/31/2019   HCT 41.0 12/31/2019   MCV 96.9 12/31/2019   PLT 187 12/31/2019    Imaging Studies: No results found.  Assessment and Plan:   Eric Simon is a 69 y.o. y/o male here to follow-up for bloating, belching.  He has a history of smoking and aerophagia may be contributing to the same.  Has improved with Dexilant.  He does suffer from constipation and it may also be causing him to  retain gas and distention.  Plan 1.  Commence on Linzess 145 mcg samples will be provided  Telephone visit in 3 weeks I discussed the assessment and treatment plan with the patient. The patient was provided an opportunity to ask questions and all were answered. The patient agreed with the plan and demonstrated an understanding of the instructions.   The patient was advised to call back or seek an in-person evaluation if the symptoms worsen or if the condition fails to improve as anticipated.  I provided 8 minutes of non-face-to-face time during this encounter.  Dr Eric Bellows MD,MRCP [facility]) Gastroenterology/Hepatology Pager: [phone]   Speech recognition software was used to dictate this note.

## 2020-01-14 ENCOUNTER — Other Ambulatory Visit: Payer: Self-pay

## 2020-01-14 ENCOUNTER — Ambulatory Visit
Admission: RE | Admit: 2020-01-14 | Discharge: 2020-01-14 | Disposition: A | Discharge status: Home / Self Care | Payer: Medicare HMO | Source: Ambulatory Visit | Attending: Oncology | Admitting: Oncology

## 2020-01-14 DIAGNOSIS — C189 Malignant neoplasm of colon, unspecified: Secondary | ICD-10-CM | POA: Insufficient documentation

## 2020-01-14 DIAGNOSIS — D701 Agranulocytosis secondary to cancer chemotherapy: Secondary | ICD-10-CM | POA: Insufficient documentation

## 2020-01-14 DIAGNOSIS — C772 Secondary and unspecified malignant neoplasm of intra-abdominal lymph nodes: Secondary | ICD-10-CM | POA: Insufficient documentation

## 2020-01-14 DIAGNOSIS — Z5111 Encounter for antineoplastic chemotherapy: Secondary | ICD-10-CM

## 2020-01-14 MED ORDER — IOHEXOL 300 MG/ML  SOLN
100.0000 mL | Freq: Once | INTRAMUSCULAR | Status: AC | PRN
  Administered 2020-01-14: 100 mL via INTRAVENOUS

## 2020-01-21 ENCOUNTER — Other Ambulatory Visit: Payer: Self-pay

## 2020-01-21 ENCOUNTER — Inpatient Hospital Stay (HOSPITAL_BASED_OUTPATIENT_CLINIC_OR_DEPARTMENT_OTHER): Payer: Medicare HMO | Admitting: Oncology

## 2020-01-21 ENCOUNTER — Inpatient Hospital Stay: Payer: Medicare HMO | Attending: Oncology

## 2020-01-21 ENCOUNTER — Inpatient Hospital Stay: Payer: Medicare HMO

## 2020-01-21 ENCOUNTER — Encounter: Payer: Self-pay | Admitting: Oncology

## 2020-01-21 VITALS — BP 123/76 | HR 73 | Resp 18

## 2020-01-21 VITALS — BP 128/70 | HR 79 | Temp 98.7°F | Ht 67.0 in | Wt 142.0 lb

## 2020-01-21 DIAGNOSIS — G620 Drug-induced polyneuropathy: Secondary | ICD-10-CM | POA: Diagnosis not present

## 2020-01-21 DIAGNOSIS — C189 Malignant neoplasm of colon, unspecified: Secondary | ICD-10-CM | POA: Diagnosis not present

## 2020-01-21 DIAGNOSIS — E785 Hyperlipidemia, unspecified: Secondary | ICD-10-CM | POA: Diagnosis not present

## 2020-01-21 DIAGNOSIS — Z452 Encounter for adjustment and management of vascular access device: Secondary | ICD-10-CM | POA: Insufficient documentation

## 2020-01-21 DIAGNOSIS — J449 Chronic obstructive pulmonary disease, unspecified: Secondary | ICD-10-CM | POA: Diagnosis not present

## 2020-01-21 DIAGNOSIS — C187 Malignant neoplasm of sigmoid colon: Secondary | ICD-10-CM | POA: Diagnosis present

## 2020-01-21 DIAGNOSIS — C786 Secondary malignant neoplasm of retroperitoneum and peritoneum: Secondary | ICD-10-CM | POA: Insufficient documentation

## 2020-01-21 DIAGNOSIS — Z5189 Encounter for other specified aftercare: Secondary | ICD-10-CM | POA: Insufficient documentation

## 2020-01-21 DIAGNOSIS — Z5111 Encounter for antineoplastic chemotherapy: Secondary | ICD-10-CM | POA: Insufficient documentation

## 2020-01-21 DIAGNOSIS — Z5112 Encounter for antineoplastic immunotherapy: Secondary | ICD-10-CM | POA: Diagnosis present

## 2020-01-21 DIAGNOSIS — C772 Secondary and unspecified malignant neoplasm of intra-abdominal lymph nodes: Secondary | ICD-10-CM

## 2020-01-21 DIAGNOSIS — T451X5A Adverse effect of antineoplastic and immunosuppressive drugs, initial encounter: Secondary | ICD-10-CM

## 2020-01-21 LAB — PROTEIN, URINE, RANDOM: Total Protein, Urine: 26 mg/dL

## 2020-01-21 LAB — CBC WITH DIFFERENTIAL/PLATELET
Abs Immature Granulocytes: 0.1 10*3/uL — ABNORMAL HIGH (ref 0.00–0.07)
Basophils Absolute: 0.1 10*3/uL (ref 0.0–0.1)
Basophils Relative: 1 %
Eosinophils Absolute: 0.1 10*3/uL (ref 0.0–0.5)
Eosinophils Relative: 1 %
HCT: 42.9 % (ref 39.0–52.0)
Hemoglobin: 14 g/dL (ref 13.0–17.0)
Immature Granulocytes: 1 %
Lymphocytes Relative: 17 %
Lymphs Abs: 1.8 10*3/uL (ref 0.7–4.0)
MCH: 32.3 pg (ref 26.0–34.0)
MCHC: 32.6 g/dL (ref 30.0–36.0)
MCV: 98.8 fL (ref 80.0–100.0)
Monocytes Absolute: 1 10*3/uL (ref 0.1–1.0)
Monocytes Relative: 10 %
Neutro Abs: 7.2 10*3/uL (ref 1.7–7.7)
Neutrophils Relative %: 70 %
Platelets: 174 10*3/uL (ref 150–400)
RBC: 4.34 MIL/uL (ref 4.22–5.81)
RDW: 15.8 % — ABNORMAL HIGH (ref 11.5–15.5)
WBC: 10.3 10*3/uL (ref 4.0–10.5)
nRBC: 0 % (ref 0.0–0.2)

## 2020-01-21 LAB — COMPREHENSIVE METABOLIC PANEL
ALT: 62 U/L — ABNORMAL HIGH (ref 0–44)
AST: 69 U/L — ABNORMAL HIGH (ref 15–41)
Albumin: 3.3 g/dL — ABNORMAL LOW (ref 3.5–5.0)
Alkaline Phosphatase: 162 U/L — ABNORMAL HIGH (ref 38–126)
Anion gap: 7 (ref 5–15)
BUN: 20 mg/dL (ref 8–23)
CO2: 23 mmol/L (ref 22–32)
Calcium: 8.7 mg/dL — ABNORMAL LOW (ref 8.9–10.3)
Chloride: 105 mmol/L (ref 98–111)
Creatinine, Ser: 0.68 mg/dL (ref 0.61–1.24)
GFR calc Af Amer: >60 mL/min (ref >60)
GFR calc non Af Amer: >60 mL/min (ref >60)
Glucose, Bld: 97 mg/dL (ref 70–99)
Potassium: 3.8 mmol/L (ref 3.5–5.1)
Sodium: 135 mmol/L (ref 135–145)
Total Bilirubin: 0.7 mg/dL (ref 0.3–1.2)
Total Protein: 7.1 g/dL (ref 6.5–8.1)

## 2020-01-21 MED ORDER — SODIUM CHLORIDE 0.9 % IV SOLN
4.8000 mg/kg | Freq: Once | INTRAVENOUS | Status: AC
  Administered 2020-01-21: 325 mg via INTRAVENOUS
  Filled 2020-01-21: qty 13

## 2020-01-21 MED ORDER — SODIUM CHLORIDE 0.9 % IV SOLN
INTRAVENOUS | Status: DC
  Filled 2020-01-21: qty 250

## 2020-01-21 MED ORDER — FLUOROURACIL CHEMO INJECTION 2.5 GM/50ML
400.0000 mg/m2 | Freq: Once | INTRAVENOUS | Status: AC
  Administered 2020-01-21: 700 mg via INTRAVENOUS
  Filled 2020-01-21: qty 14

## 2020-01-21 MED ORDER — PALONOSETRON HCL INJECTION 0.25 MG/5ML
0.2500 mg | Freq: Once | INTRAVENOUS | Status: AC
  Administered 2020-01-21: 0.25 mg via INTRAVENOUS
  Filled 2020-01-21: qty 5

## 2020-01-21 MED ORDER — DEXAMETHASONE SODIUM PHOSPHATE 10 MG/ML IJ SOLN
10.0000 mg | Freq: Once | INTRAMUSCULAR | Status: AC
  Administered 2020-01-21: 10 mg via INTRAVENOUS
  Filled 2020-01-21: qty 1

## 2020-01-21 MED ORDER — SODIUM CHLORIDE 0.9% FLUSH
10.0000 mL | Freq: Once | INTRAVENOUS | Status: AC
  Administered 2020-01-21: 10 mL via INTRAVENOUS
  Filled 2020-01-21: qty 10

## 2020-01-21 MED ORDER — LEUCOVORIN CALCIUM INJECTION 350 MG
700.0000 mg | Freq: Once | INTRAVENOUS | Status: AC
  Administered 2020-01-21: 700 mg via INTRAVENOUS
  Filled 2020-01-21: qty 10

## 2020-01-21 MED ORDER — SODIUM CHLORIDE 0.9 % IV SOLN
2400.0000 mg/m2 | INTRAVENOUS | Status: DC
  Administered 2020-01-21: 4300 mg via INTRAVENOUS
  Filled 2020-01-21: qty 86

## 2020-01-21 NOTE — Progress Notes (Signed)
Patient stated that since his last injection, he has had tingling fingers and toes. Patient stated that it has been hard to grab and feel with his fingers.

## 2020-01-22 NOTE — Progress Notes (Signed)
Hematology/Oncology Consult note [facility]  [phone] [phone]  Patient Care Team: Jodi Marble, MD as PCP - General (Internal Medicine) Clent Jacks, RN as Oncology Nurse Navigator   Name of the patient: Eric Simon  [id number]  07-05-1951   Date of visit: 01/22/20  Diagnosis- metastatic colon cancer with lymph node metastases K-ras/BRAF wild-type  Chief complaint/ Reason for visit-on treatment assessment prior to cycle 13 of 5-FU Mvasi chemotherapy  Heme/Onc history: Patient is a 69 yr old male with >40 pack year history of smoking. He currently smokes 0.5ppd. he presented to the ER with symptoms of left sided chest pain and left arm pain. Troponin was negative ekg was unremarkable. Ct chest showed no PE. He was incidentally noted to have mediastinal and retrocrural adenopathy and a 2.1X2.3X4.4 cm retroaortic soft tissue lesion in the posterior left chest. 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 Low-grade activity in the left lateral fifth and sixth ribs associated with nondisplaced fractures likely reflecting healing response problem malignancy.  Patient underwent CT-guided biopsy of the retroperitoneal lymph node pathology showed metastatic adenocarcinoma compatible with colorectal origin. CK7 negative. CK20 positive. CDX 2+. TTF-1 negative. PSA negative. This pattern of immunoreactivity supports the above diagnosis. Patient underwent colonoscopy which showed sigmoid mass that was consistent with adenocarcinoma. Rast panel testing showed that he was wild-type for both K-ras and BRAF  Interval history-nausea and vomiting has resolved.  Weight has remained stable.  Does report some tingling numbness in his hands and feet  ECOG PS- 1 Pain scale- 3 Opioid associated constipation-  no  Review of systems- Review of Systems  Constitutional: Negative for chills, fever, malaise/fatigue and weight loss.  HENT: Negative for congestion, ear discharge and nosebleeds.   Eyes: Negative for blurred vision.  Respiratory: Negative for cough, hemoptysis, sputum production, shortness of breath and wheezing.   Cardiovascular: Negative for chest pain, palpitations, orthopnea and claudication.  Gastrointestinal: Negative for abdominal pain, blood in stool, constipation, diarrhea, heartburn, melena, nausea and vomiting.  Genitourinary: Negative for dysuria, flank pain, frequency, hematuria and urgency.  Musculoskeletal: Negative for back pain, joint pain and myalgias.  Skin: Negative for rash.  Neurological: Positive for sensory change (Peripheral neuropathy). Negative for dizziness, tingling, focal weakness, seizures, weakness and headaches.  Endo/Heme/Allergies: Does not bruise/bleed easily.  Psychiatric/Behavioral: Negative for depression and suicidal ideas. The patient does not have insomnia.      No Known Allergies   Past Medical History:  Diagnosis Date  . Colon cancer ([place])   . COPD (chronic obstructive pulmonary disease) ([place])   . Hyperlipidemia      Past Surgical History:  Procedure Laterality Date  . COLONOSCOPY WITH PROPOFOL N/A 06/26/2019   Procedure: COLONOSCOPY WITH PROPOFOL;  Surgeon: [name], MD;  Location: [facility] ENDOSCOPY;  Service: Gastroenterology;  Laterality: N/A;  . PORTA CATH INSERTION N/A 06/25/2019   Procedure: PORTA CATH INSERTION;  Surgeon: Algernon Huxley, MD;  Location: [place] CV LAB;  Service: Cardiovascular;  Laterality: N/A;    Social History   Socioeconomic History  . Marital status: Single    Spouse name: Not on file  . Number of children: Not on file  . Years of education: Not on file  . Highest education level: Not on file  Occupational History  . Not on file  Tobacco Use  . Smoking status: Current Every Day Smoker  Packs/day: 0.50    Years: 40.00    Pack years: 20.00    Types: Cigarettes  . Smokeless tobacco: Never Used  . Tobacco comment: smoking 40 years  Substance and Sexual Activity  . Alcohol use: No  . Drug use: No  . Sexual activity: Not Currently  Other Topics Concern  . Not on file  Social History Narrative  . Not on file   Social Determinants of Health   Financial Resource Strain: Low Risk   . Difficulty of Paying Living Expenses: Not hard at all  Food Insecurity: No Food Insecurity  . Worried About Charity fundraiser in the Last Year: Never true  . Ran Out of Food in the Last Year: Never true  Transportation Needs: No Transportation Needs  . Lack of Transportation (Medical): No  . Lack of Transportation (Non-Medical): No  Physical Activity:   . Days of Exercise per Week: Not on file  . Minutes of Exercise per Session: Not on file  Stress: No Stress Concern Present  . Feeling of Stress : Not at all  Social Connections: Unknown  . Frequency of Communication with Friends and Family: More than three times a week  . Frequency of Social Gatherings with Friends and Family: Not on file  . Attends Religious Services: Not on file  . Active Member of Clubs or Organizations: Not on file  . Attends Archivist Meetings: Not on file  . Marital Status: Not on file  Intimate Partner Violence: Not At Risk  . Fear of Current or Ex-Partner: No  . Emotionally Abused: No  . Physically Abused: No  . Sexually Abused: No    Family History  Problem Relation Age of Onset  . Brain cancer Father      Current Outpatient Medications:  .  aspirin EC 81 MG tablet, Take 81 mg by mouth daily., Disp: , Rfl:  .  azelastine (ASTELIN) 0.1 % nasal spray, Place 1 spray into both nostrils 1 day or 1 dose., Disp: , Rfl:  .  cetirizine (ZYRTEC) 10 MG tablet, Take 1 tablet (10 mg total) by mouth daily. (Patient taking differently: Take 10 mg by mouth daily as needed. ), Disp: 30 tablet, Rfl: 0 .   chlorthalidone (HYGROTON) 25 MG tablet, Take 1 tablet by mouth daily., Disp: , Rfl:  .  dexamethasone (DECADRON) 4 MG tablet, Take 2 tablets (8 mg total) by mouth daily. Start the day after chemotherapy for 2 days. Take with food., Disp: 30 tablet, Rfl: 1 .  DEXILANT 60 MG capsule, Take 1 capsule by mouth 1 day or 1 dose., Disp: , Rfl:  .  fluticasone (FLONASE) 50 MCG/ACT nasal spray, Place 2 sprays into both nostrils 1 day or 1 dose., Disp: , Rfl:  .  fluticasone (VERAMYST) 27.5 MCG/SPRAY nasal spray, Place 2 sprays into the nose daily., Disp: , Rfl:  .  lidocaine-prilocaine (EMLA) cream, Apply to affected area once, Disp: 30 g, Rfl: 3 .  magic mouthwash w/lidocaine SOLN, Take 5-10 mLs by mouth 4 (four) times daily as needed for mouth pain., Disp: 480 mL, Rfl: 3 .  montelukast (SINGULAIR) 10 MG tablet, Take 10 mg by mouth at bedtime., Disp: , Rfl:  .  nicotine (NICODERM CQ - DOSED IN MG/24 HOURS) 21 mg/24hr patch, Place 1 patch onto the skin 1 day or 1 dose., Disp: , Rfl:  .  ondansetron (ZOFRAN) 8 MG tablet, Take 1 tablet (8 mg total) by mouth 2 (two) times daily as  needed for refractory nausea / vomiting. Start on day 3 after chemotherapy., Disp: 30 tablet, Rfl: 1 .  pantoprazole (PROTONIX) 40 MG tablet, Take 40 mg by mouth daily., Disp: , Rfl:  .  potassium chloride SA (K-DUR) 20 MEQ tablet, Take 1 tablet (20 mEq total) by mouth daily., Disp: 14 tablet, Rfl: 0 .  prochlorperazine (COMPAZINE) 10 MG tablet, Take 1 tablet (10 mg total) by mouth every 6 (six) hours as needed (Nausea or vomiting)., Disp: 30 tablet, Rfl: 1 .  simvastatin (ZOCOR) 20 MG tablet, Take 20 mg by mouth daily., Disp: , Rfl:  .  sucralfate (CARAFATE) 1 g tablet, Take 1 g by mouth 4 (four) times daily., Disp: , Rfl:  .  SYMBICORT 80-4.5 MCG/ACT inhaler, Inhale 2 puffs into the lungs 1 day or 1 dose., Disp: , Rfl:  .  tamsulosin (FLOMAX) 0.4 MG CAPS capsule, Take 0.4 mg by mouth., Disp: , Rfl:  .  traMADol-acetaminophen  (ULTRACET) 37.5-325 MG tablet, Take 1 tablet by mouth every 6 (six) hours., Disp: , Rfl:  No current facility-administered medications for this visit.  Facility-Administered Medications Ordered in Other Visits:  .  sodium chloride flush (NS) 0.9 % injection 10 mL, 10 mL, Intracatheter, PRN, Sindy Guadeloupe, MD, 10 mL at 08/15/19 1405  Physical exam:  Vitals:   01/21/20 0922  BP: 128/70  Pulse: 79  Temp: 98.7 F (37.1 C)  TempSrc: Tympanic  Weight: 142 lb (64.4 kg)  Height: '5\' 7"'  (1.702 m)   Physical Exam HENT:     Head: Normocephalic and atraumatic.  Eyes:     Pupils: Pupils are equal, round, and reactive to light.  Cardiovascular:     Rate and Rhythm: Normal rate and regular rhythm.     Heart sounds: Normal heart sounds.  Pulmonary:     Effort: Pulmonary effort is normal.     Breath sounds: Normal breath sounds.  Abdominal:     General: Bowel sounds are normal.     Palpations: Abdomen is soft.  Musculoskeletal:     Cervical back: Normal range of motion.  Skin:    General: Skin is warm and dry.  Neurological:     Mental Status: He is alert and oriented to person, place, and time.      CMP Latest Ref Rng & Units 01/21/2020  Glucose 70 - 99 mg/dL 97  BUN 8 - 23 mg/dL 20  Creatinine 0.61 - 1.24 mg/dL 0.68  Sodium 135 - 145 mmol/L 135  Potassium 3.5 - 5.1 mmol/L 3.8  Chloride 98 - 111 mmol/L 105  CO2 22 - 32 mmol/L 23  Calcium 8.9 - 10.3 mg/dL 8.7(L)  Total Protein 6.5 - 8.1 g/dL 7.1  Total Bilirubin 0.3 - 1.2 mg/dL 0.7  Alkaline Phos 38 - 126 U/L 162(H)  AST 15 - 41 U/L 69(H)  ALT 0 - 44 U/L 62(H)   CBC Latest Ref Rng & Units 01/21/2020  WBC 4.0 - 10.5 K/uL 10.3  Hemoglobin 13.0 - 17.0 g/dL 14.0  Hematocrit 39.0 - 52.0 % 42.9  Platelets 150 - 400 K/uL 174    No images are attached to the encounter.  CT Chest W Contrast  Result Date: 01/14/2020 CLINICAL DATA:  Colon cancer with lymph node metastases EXAM: CT CHEST, ABDOMEN, AND PELVIS WITH CONTRAST TECHNIQUE:  Multidetector CT imaging of the chest, abdomen and pelvis was performed following the standard protocol during bolus administration of intravenous contrast. CONTRAST:  181m OMNIPAQUE IOHEXOL 300 MG/ML  SOLN COMPARISON:  None. CT chest abdomen pelvis dated 09/17/2019 FINDINGS: CT CHEST FINDINGS Cardiovascular: Heart is normal in size. No pericardial effusion. No evidence of thoracic aortic aneurysm. Atherosclerotic calcifications of the aortic arch. Mild coronary atherosclerosis of the LAD. Right chest port terminates at the cavoatrial junction. Mediastinum/Nodes: No suspicious mediastinal lymphadenopathy. Visualized thyroid is unremarkable. Lungs/Pleura: Moderate centrilobular and paraseptal emphysematous changes, upper lung predominant. Mild biapical pleural-parenchymal scarring. Mild dependent atelectasis in the bilateral upper and lower lobes, right lower lobe predominant. No focal consolidation. No suspicious pulmonary nodules. No pleural effusion or pneumothorax. Musculoskeletal: Very mild degenerative changes of the mid thoracic spine. CT ABDOMEN PELVIS FINDINGS Hepatobiliary: Liver is within normal limits. Gallbladder is underdistended. No intrahepatic or extrahepatic ductal dilatation. Pancreas: Within normal limits. Spleen: Within normal limits. Adrenals/Urinary Tract: Adrenal glands are within normal limits. Subcentimeter bilateral renal cysts.  No hydronephrosis. Bladder is within normal limits. Stomach/Bowel: Stomach is within normal limits. No evidence of bowel obstruction. Normal appendix (series 2/image 79). Scattered left colonic diverticulosis, without evidence of diverticulitis. No colonic wall thickening or mass is evident on CT. Vascular/Lymphatic: No evidence of abdominal aortic aneurysm. Atherosclerotic calcifications of the abdominal aorta and branch vessels. Improving abdominal lymphadenopathy, including: --6 mm short axis aortocaval node (series 2/image 32), previously 8 mm --5 mm short  axis left common iliac node (series 2/image 86), previously 9 mm --8 mm short axis left pelvic sidewall node (series 2/image 106), previously 10 mm Reproductive: Mild prostatomegaly. Other: No abdominopelvic ascites. Musculoskeletal: Mild degenerative changes at L5-S1. IMPRESSION: Small residual lymph nodes in the abdomen/pelvis, measuring up to 8 mm short axis, improved. No evidence of metastatic disease in the chest. No colonic wall thickening/mass is evident on CT in this patient with known colon cancer. Electronically Signed   By: Julian Hy M.D.   On: 01/14/2020 10:14   CT Abdomen Pelvis W Contrast  Result Date: 01/14/2020 CLINICAL DATA:  Colon cancer with lymph node metastases EXAM: CT CHEST, ABDOMEN, AND PELVIS WITH CONTRAST TECHNIQUE: Multidetector CT imaging of the chest, abdomen and pelvis was performed following the standard protocol during bolus administration of intravenous contrast. CONTRAST:  139m OMNIPAQUE IOHEXOL 300 MG/ML  SOLN COMPARISON:  None. CT chest abdomen pelvis dated 09/17/2019 FINDINGS: CT CHEST FINDINGS Cardiovascular: Heart is normal in size. No pericardial effusion. No evidence of thoracic aortic aneurysm. Atherosclerotic calcifications of the aortic arch. Mild coronary atherosclerosis of the LAD. Right chest port terminates at the cavoatrial junction. Mediastinum/Nodes: No suspicious mediastinal lymphadenopathy. Visualized thyroid is unremarkable. Lungs/Pleura: Moderate centrilobular and paraseptal emphysematous changes, upper lung predominant. Mild biapical pleural-parenchymal scarring. Mild dependent atelectasis in the bilateral upper and lower lobes, right lower lobe predominant. No focal consolidation. No suspicious pulmonary nodules. No pleural effusion or pneumothorax. Musculoskeletal: Very mild degenerative changes of the mid thoracic spine. CT ABDOMEN PELVIS FINDINGS Hepatobiliary: Liver is within normal limits. Gallbladder is underdistended. No intrahepatic or  extrahepatic ductal dilatation. Pancreas: Within normal limits. Spleen: Within normal limits. Adrenals/Urinary Tract: Adrenal glands are within normal limits. Subcentimeter bilateral renal cysts.  No hydronephrosis. Bladder is within normal limits. Stomach/Bowel: Stomach is within normal limits. No evidence of bowel obstruction. Normal appendix (series 2/image 79). Scattered left colonic diverticulosis, without evidence of diverticulitis. No colonic wall thickening or mass is evident on CT. Vascular/Lymphatic: No evidence of abdominal aortic aneurysm. Atherosclerotic calcifications of the abdominal aorta and branch vessels. Improving abdominal lymphadenopathy, including: --6 mm short axis aortocaval node (series 2/image 32), previously 8 mm --5 mm short axis left common iliac  node (series 2/image 86), previously 9 mm --8 mm short axis left pelvic sidewall node (series 2/image 106), previously 10 mm Reproductive: Mild prostatomegaly. Other: No abdominopelvic ascites. Musculoskeletal: Mild degenerative changes at L5-S1. IMPRESSION: Small residual lymph nodes in the abdomen/pelvis, measuring up to 8 mm short axis, improved. No evidence of metastatic disease in the chest. No colonic wall thickening/mass is evident on CT in this patient with known colon cancer. Electronically Signed   By: Julian Hy M.D.   On: 01/14/2020 10:14     Assessment and plan- Patient is a 69 y.o. male with metastatic colon cancer TX NX M1 with generalized lymphadenopathy K-ras/beta wild type.   He is here for on treatment assessment prior to cycle 13 of 5-FU Mvasi chemotherapy  Oxaliplatin was withheld after cycle 12.  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  We discussed trying medications like Cymbalta or gabapentin and he would like to hold off on that at this time and see how it progresses over the next few weeks.  I have reviewed  CT chest abdomen  and pelvis images independently and discussed findings with the patient.Overall patient has had continued response to treatment and he has mild persistent adenopathy measuring up to 8 mm in his abdomen and pelvis.  No evidence of adenopathy in the chest.  Patient will continue chemotherapy until progression or toxicity but we will be withholding oxaliplatin.  Counts are otherwise okay to proceed with 5-FU chemotherapy along with Mvasi today.  Patient would like to receive treatment every 3 weeks for better quality of life and given his low volume disease that may be reasonable   64f Mvasi chemotherapy today.  Pump DC on day 3 and receives UCongo  Return to clinic in 3 weeks.  Port labs: CBC with differential, CMP and urine protein.  See Dr. RJanese Banks  Gets 5-FU Mvasi chemotherapy on day 1 and udenyca on day 3    Visit Diagnosis 1. 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 ([place]   2. Encounter for antineoplastic chemotherapy   3. Encounter for monoclonal antibody treatment for malignancy   4. Chem[place] peripheral neuropathy ([place]      Dr. ARanda Evens MD, MPH [id number]/08/2020 12:59 PM

## 2020-01-23 ENCOUNTER — Inpatient Hospital Stay: Payer: Medicare HMO

## 2020-01-23 ENCOUNTER — Other Ambulatory Visit: Payer: Self-pay

## 2020-01-23 VITALS — BP 123/73 | HR 83 | Temp 98.3°F

## 2020-01-23 DIAGNOSIS — Z5112 Encounter for antineoplastic immunotherapy: Secondary | ICD-10-CM | POA: Diagnosis not present

## 2020-01-23 DIAGNOSIS — C189 Malignant neoplasm of colon, unspecified: Secondary | ICD-10-CM

## 2020-01-23 DIAGNOSIS — C772 Secondary and unspecified malignant neoplasm of intra-abdominal lymph nodes: Secondary | ICD-10-CM

## 2020-01-23 MED ORDER — HEPARIN SOD (PORK) LOCK FLUSH 100 UNIT/ML IV SOLN
500.0000 [IU] | Freq: Once | INTRAVENOUS | Status: AC | PRN
  Administered 2020-01-23: 500 [IU]
  Filled 2020-01-23: qty 5

## 2020-01-23 MED ORDER — PEGFILGRASTIM-CBQV 6 MG/0.6ML ~~LOC~~ SOSY
6.0000 mg | PREFILLED_SYRINGE | Freq: Once | SUBCUTANEOUS | Status: AC
  Administered 2020-01-23: 6 mg via SUBCUTANEOUS
  Filled 2020-01-23: qty 0.6

## 2020-01-23 MED ORDER — SODIUM CHLORIDE 0.9% FLUSH
10.0000 mL | INTRAVENOUS | Status: DC | PRN
  Administered 2020-01-23: 13:00:00 10 mL
  Filled 2020-01-23: qty 10

## 2020-01-28 ENCOUNTER — Telehealth: Payer: Self-pay | Admitting: *Deleted

## 2020-01-28 MED ORDER — DULOXETINE HCL 30 MG PO CPEP: 30.0000 mg | ORAL_CAPSULE | ORAL | 0 refills | Status: DC

## 2020-01-28 NOTE — Telephone Encounter (Signed)
We will call him

## 2020-01-28 NOTE — Telephone Encounter (Signed)
Patient called reporting that his hands and feet are cold and numb, worse than last week. Please advise

## 2020-01-28 NOTE — Addendum Note (Signed)
Addended by: Luella Cook on: 01/28/2020 01:40 PM   Modules accepted: Orders

## 2020-01-28 NOTE — Telephone Encounter (Addendum)
Called pt and let him know that DR. [name] has already took out the oxaliplatin from his chemo regimen that is the med that causes the neuropathy. The patient states that is is affecting fingers and toes bilateral. It also stays cold all the time with his feet. He chooses to take cymbalta over gabapentin. Will start him off with 30 mg daily x 1 week and then increase to 2 pills a day for the rest of the month. When he needs refill to call me and I will send him 60 mg tablet to take daily. When I went to put in the order for cymbalta-it came up with poss. Interaction with tramadol. I called pt and he does not have tramadol any longer.

## 2020-01-31 ENCOUNTER — Other Ambulatory Visit: Payer: Self-pay

## 2020-01-31 ENCOUNTER — Inpatient Hospital Stay (HOSPITAL_BASED_OUTPATIENT_CLINIC_OR_DEPARTMENT_OTHER): Payer: Medicare HMO | Admitting: Hospice and Palliative Medicine

## 2020-01-31 DIAGNOSIS — Z515 Encounter for palliative care: Secondary | ICD-10-CM

## 2020-01-31 NOTE — Progress Notes (Signed)
I was unable to reach patient by phone.  Will reschedule visit. 

## 2020-02-05 ENCOUNTER — Ambulatory Visit (INDEPENDENT_AMBULATORY_CARE_PROVIDER_SITE_OTHER): Payer: Medicare HMO | Admitting: Gastroenterology

## 2020-02-05 DIAGNOSIS — R140 Abdominal distension (gaseous): Secondary | ICD-10-CM | POA: Diagnosis not present

## 2020-02-05 DIAGNOSIS — K5900 Constipation, unspecified: Secondary | ICD-10-CM

## 2020-02-05 NOTE — Progress Notes (Signed)
Eric Simon , MD [address]  [place]  [place], [place] [address]  Main: [phone]  Fax: [phone]   Primary Care Physician: Jodi Marble, MD  Virtual Visit via Telephone Note  I connected with patient on 02/05/20 at  9:00 AM EST by telephone and verified that I am speaking with the correct person using two identifiers.   I discussed the limitations, risks, security and privacy concerns of performing an evaluation and management service by telephone and the availability of in person appointments. I also discussed with the patient that there may be a patient responsible charge related to this service. The patient expressed understanding and agreed to proceed.  Location of Patient: Home Location of Provider: Home Persons involved: Patient and provider only   History of Present Illness:   Follow-up for constipation  HPI: Eric Simon is a 69 y.o. male   Summary of history : Initially referred and seen in December 2020 for nausea and indigestion.  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 I have performed a colonoscopy which showed sigmoid mass consistent with adenocarcinoma. No obstructive lesion seen on colonoscopy in July 2020. Undergoing treatment with oncology. CT scan of the abdomen in October 2020 demonstrated improving lymphadenopathy in chest abdomen and pelvis. No colon mass seen in the patient. He has a 40-pack-year of smoking.  12/10/2019: Hemoglobin 14.4 g, mild elevated AST and ALT of 58 and 52.been commenced on Dexilant for nausea. Presently undergoing chemotherapy.  Previously had bloating which also responded to Clarita.   Interval history  01/09/2020-02/05/2020 Initially treated constipation with MiraLAX which does not work commenced on Linzess 145 mcg at his last visit.  Constipation doing well.  Does not feel like he needs any increase in the dosage.  Only issue is burping which I suggest to  increase his charcoal tablets to 3 times a day from the present 2 times a day.  The charcoal tablets has helped     Current Outpatient Medications  Medication Sig Dispense Refill  . aspirin EC 81 MG tablet Take 81 mg by mouth daily.    [name] azelastine (ASTELIN) 0.1 % nasal spray Place 1 spray into both nostrils 1 day or 1 dose.    . cetirizine (ZYRTEC) 10 MG tablet Take 1 tablet (10 mg total) by mouth daily. (Patient taking differently: Take 10 mg by mouth daily as needed. ) 30 tablet 0  . chlorthalidone (HYGROTON) 25 MG tablet Take 1 tablet by mouth daily.    [name] dexamethasone (DECADRON) 4 MG tablet Take 2 tablets (8 mg total) by mouth daily. Start the day after chemotherapy for 2 days. Take with food. 30 tablet 1  . DEXILANT 60 MG capsule Take 1 capsule by mouth 1 day or 1 dose.    . DULoxetine (CYMBALTA) 30 MG capsule Take 1 capsule (30 mg total) by mouth as directed. 1 capsule daily x 7 days and then increase 2 tablets daily for rest of Rx, then will give new rx for 60 mg after this 61 capsule 0  . fluticasone (FLONASE) 50 MCG/ACT nasal spray Place 2 sprays into both nostrils 1 day or 1 dose.    . fluticasone (VERAMYST) 27.5 MCG/SPRAY nasal spray Place 2 sprays into the nose daily.    [name] lidocaine-prilocaine (EMLA) cream Apply to affected area once 30 g 3  . magic mouthwash w/lidocaine SOLN Take 5-10 mLs by mouth 4 (four) times daily as needed for mouth pain.  480 mL 3  . montelukast (SINGULAIR) 10 MG tablet Take 10 mg by mouth at bedtime.    . nicotine (NICODERM CQ - DOSED IN MG/24 HOURS) 21 mg/24hr patch Place 1 patch onto the skin 1 day or 1 dose.    . ondansetron (ZOFRAN) 8 MG tablet Take 1 tablet (8 mg total) by mouth 2 (two) times daily as needed for refractory nausea / vomiting. Start on day 3 after chemotherapy. 30 tablet 1  . pantoprazole (PROTONIX) 40 MG tablet Take 40 mg by mouth daily.    . potassium chloride SA (K-DUR) 20 MEQ tablet Take 1 tablet (20 mEq total) by mouth daily. 14  tablet 0  . prochlorperazine (COMPAZINE) 10 MG tablet Take 1 tablet (10 mg total) by mouth every 6 (six) hours as needed (Nausea or vomiting). 30 tablet 1  . simvastatin (ZOCOR) 20 MG tablet Take 20 mg by mouth daily.    . sucralfate (CARAFATE) 1 g tablet Take 1 g by mouth 4 (four) times daily.    . SYMBICORT 80-4.5 MCG/ACT inhaler Inhale 2 puffs into the lungs 1 day or 1 dose.    . tamsulosin (FLOMAX) 0.4 MG CAPS capsule Take 0.4 mg by mouth.     No current facility-administered medications for this visit.   Facility-Administered Medications Ordered in Other Visits  Medication Dose Route Frequency Provider Last Rate Last Admin  . sodium chloride flush (NS) 0.9 % injection 10 mL  10 mL Intracatheter PRN Sindy Guadeloupe, MD   10 mL at 08/15/19 1405    Allergies as of 02/05/2020  . (No Known Allergies)    Review of Systems:    All systems reviewed and negative except where noted in HPI.   Observations/Objective:  Labs: CMP     Component Value Date/Time   NA 135 01/21/2020 0902   NA 139 08/18/2012 1321   K 3.8 01/21/2020 0902   K 3.7 08/18/2012 1321   CL 105 01/21/2020 0902   CL 104 08/18/2012 1321   CO2 23 01/21/2020 0902   CO2 27 08/18/2012 1321   GLUCOSE 97 01/21/2020 0902   GLUCOSE 68 08/18/2012 1321   BUN 20 01/21/2020 0902   BUN 18 08/18/2012 1321   CREATININE 0.68 01/21/2020 0902   CREATININE 0.82 08/18/2012 1321   CALCIUM 8.7 (L) 01/21/2020 0902   CALCIUM 9.0 08/18/2012 1321   PROT 7.1 01/21/2020 0902   PROT 8.0 08/18/2012 1321   ALBUMIN 3.3 (L) 01/21/2020 0902   ALBUMIN 4.1 08/18/2012 1321   AST 69 (H) 01/21/2020 0902   AST 36 08/18/2012 1321   ALT 62 (H) 01/21/2020 0902   ALT 51 08/18/2012 1321   ALKPHOS 162 (H) 01/21/2020 0902   ALKPHOS 88 08/18/2012 1321   BILITOT 0.7 01/21/2020 0902   BILITOT 0.4 08/18/2012 1321   GFRNONAA >60 01/21/2020 0902   GFRNONAA >60 08/18/2012 1321   GFRAA >60 01/21/2020 0902   GFRAA >60 08/18/2012 1321   Lab Results    Component Value Date   WBC 10.3 01/21/2020   HGB 14.0 01/21/2020   HCT 42.9 01/21/2020   MCV 98.8 01/21/2020   PLT 174 01/21/2020    Imaging Studies: CT Chest W Contrast  Result Date: 01/14/2020 CLINICAL DATA:  Colon cancer with lymph node metastases EXAM: CT CHEST, ABDOMEN, AND PELVIS WITH CONTRAST TECHNIQUE: Multidetector CT imaging of the chest, abdomen and pelvis was performed following the standard protocol during bolus administration of intravenous contrast. CONTRAST:  194mL OMNIPAQUE IOHEXOL 300 MG/ML  SOLN COMPARISON:  None. CT chest abdomen pelvis dated 09/17/2019 FINDINGS: CT CHEST FINDINGS Cardiovascular: Heart is normal in size. No pericardial effusion. No evidence of thoracic aortic aneurysm. Atherosclerotic calcifications of the aortic arch. Mild coronary atherosclerosis of the LAD. Right chest port terminates at the cavoatrial junction. Mediastinum/Nodes: No suspicious mediastinal lymphadenopathy. Visualized thyroid is unremarkable. Lungs/Pleura: Moderate centrilobular and paraseptal emphysematous changes, upper lung predominant. Mild biapical pleural-parenchymal scarring. Mild dependent atelectasis in the bilateral upper and lower lobes, right lower lobe predominant. No focal consolidation. No suspicious pulmonary nodules. No pleural effusion or pneumothorax. Musculoskeletal: Very mild degenerative changes of the mid thoracic spine. CT ABDOMEN PELVIS FINDINGS Hepatobiliary: Liver is within normal limits. Gallbladder is underdistended. No intrahepatic or extrahepatic ductal dilatation. Pancreas: Within normal limits. Spleen: Within normal limits. Adrenals/Urinary Tract: Adrenal glands are within normal limits. Subcentimeter bilateral renal cysts.  No hydronephrosis. Bladder is within normal limits. Stomach/Bowel: Stomach is within normal limits. No evidence of bowel obstruction. Normal appendix (series 2/image 79). Scattered left colonic diverticulosis, without evidence of diverticulitis.  No colonic wall thickening or mass is evident on CT. Vascular/Lymphatic: No evidence of abdominal aortic aneurysm. Atherosclerotic calcifications of the abdominal aorta and branch vessels. Improving abdominal lymphadenopathy, including: --6 mm short axis aortocaval node (series 2/image 32), previously 8 mm --5 mm short axis left common iliac node (series 2/image 86), previously 9 mm --8 mm short axis left pelvic sidewall node (series 2/image 106), previously 10 mm Reproductive: Mild prostatomegaly. Other: No abdominopelvic ascites. Musculoskeletal: Mild degenerative changes at L5-S1. IMPRESSION: Small residual lymph nodes in the abdomen/pelvis, measuring up to 8 mm short axis, improved. No evidence of metastatic disease in the chest. No colonic wall thickening/mass is evident on CT in this patient with known colon cancer. Electronically Signed   By: Julian Hy M.D.   On: 01/14/2020 10:14   CT Abdomen Pelvis W Contrast  Result Date: 01/14/2020 CLINICAL DATA:  Colon cancer with lymph node metastases EXAM: CT CHEST, ABDOMEN, AND PELVIS WITH CONTRAST TECHNIQUE: Multidetector CT imaging of the chest, abdomen and pelvis was performed following the standard protocol during bolus administration of intravenous contrast. CONTRAST:  159mL OMNIPAQUE IOHEXOL 300 MG/ML  SOLN COMPARISON:  None. CT chest abdomen pelvis dated 09/17/2019 FINDINGS: CT CHEST FINDINGS Cardiovascular: Heart is normal in size. No pericardial effusion. No evidence of thoracic aortic aneurysm. Atherosclerotic calcifications of the aortic arch. Mild coronary atherosclerosis of the LAD. Right chest port terminates at the cavoatrial junction. Mediastinum/Nodes: No suspicious mediastinal lymphadenopathy. Visualized thyroid is unremarkable. Lungs/Pleura: Moderate centrilobular and paraseptal emphysematous changes, upper lung predominant. Mild biapical pleural-parenchymal scarring. Mild dependent atelectasis in the bilateral upper and lower lobes, right  lower lobe predominant. No focal consolidation. No suspicious pulmonary nodules. No pleural effusion or pneumothorax. Musculoskeletal: Very mild degenerative changes of the mid thoracic spine. CT ABDOMEN PELVIS FINDINGS Hepatobiliary: Liver is within normal limits. Gallbladder is underdistended. No intrahepatic or extrahepatic ductal dilatation. Pancreas: Within normal limits. Spleen: Within normal limits. Adrenals/Urinary Tract: Adrenal glands are within normal limits. Subcentimeter bilateral renal cysts.  No hydronephrosis. Bladder is within normal limits. Stomach/Bowel: Stomach is within normal limits. No evidence of bowel obstruction. Normal appendix (series 2/image 79). Scattered left colonic diverticulosis, without evidence of diverticulitis. No colonic wall thickening or mass is evident on CT. Vascular/Lymphatic: No evidence of abdominal aortic aneurysm. Atherosclerotic calcifications of the abdominal aorta and branch vessels. Improving abdominal lymphadenopathy, including: --6 mm short axis aortocaval node (series 2/image 32), previously 8 mm --5 mm short axis  left common iliac node (series 2/image 86), previously 9 mm --8 mm short axis left pelvic sidewall node (series 2/image 106), previously 10 mm Reproductive: Mild prostatomegaly. Other: No abdominopelvic ascites. Musculoskeletal: Mild degenerative changes at L5-S1. IMPRESSION: Small residual lymph nodes in the abdomen/pelvis, measuring up to 8 mm short axis, improved. No evidence of metastatic disease in the chest. No colonic wall thickening/mass is evident on CT in this patient with known colon cancer. Electronically Signed   By: Julian Hy M.D.   On: 01/14/2020 10:14    Assessment and Plan:   Eric Simon is a 69 y.o. y/o male here to follow-up forconstipation.  Doing well with Linzess.  Does not feel the dose needs to be increased.  Only issue is a bit of burping I have suggested increasing charcoal tablets to 3 times a  day       I discussed the assessment and treatment plan with the patient. The patient was provided an opportunity to ask questions and all were answered. The patient agreed with the plan and demonstrated an understanding of the instructions.   The patient was advised to call back or seek an in-person evaluation if the symptoms worsen or if the condition fails to improve as anticipated.  I provided 11 minutes of non-face-to-face time during this encounter.  Dr Eric Bellows MD,MRCP [facility]) Gastroenterology/Hepatology Pager: [phone]   Speech recognition software was used to dictate this note.

## 2020-02-08 ENCOUNTER — Encounter: Payer: Self-pay | Admitting: Oncology

## 2020-02-08 ENCOUNTER — Other Ambulatory Visit: Payer: Self-pay

## 2020-02-08 NOTE — Progress Notes (Signed)
Patient stated that he continued to have neuropathy on his fingers and feet.

## 2020-02-11 ENCOUNTER — Inpatient Hospital Stay (HOSPITAL_BASED_OUTPATIENT_CLINIC_OR_DEPARTMENT_OTHER): Payer: Medicare HMO | Admitting: Oncology

## 2020-02-11 ENCOUNTER — Inpatient Hospital Stay: Payer: Medicare HMO

## 2020-02-11 ENCOUNTER — Other Ambulatory Visit: Payer: Self-pay

## 2020-02-11 ENCOUNTER — Inpatient Hospital Stay: Payer: Medicare HMO | Attending: Oncology

## 2020-02-11 VITALS — BP 140/76 | HR 81 | Temp 98.4°F | Ht 67.0 in | Wt 135.0 lb

## 2020-02-11 DIAGNOSIS — G620 Drug-induced polyneuropathy: Secondary | ICD-10-CM

## 2020-02-11 DIAGNOSIS — C189 Malignant neoplasm of colon, unspecified: Secondary | ICD-10-CM | POA: Diagnosis not present

## 2020-02-11 DIAGNOSIS — R634 Abnormal weight loss: Secondary | ICD-10-CM | POA: Insufficient documentation

## 2020-02-11 DIAGNOSIS — R945 Abnormal results of liver function studies: Secondary | ICD-10-CM | POA: Insufficient documentation

## 2020-02-11 DIAGNOSIS — J449 Chronic obstructive pulmonary disease, unspecified: Secondary | ICD-10-CM | POA: Insufficient documentation

## 2020-02-11 DIAGNOSIS — Z79899 Other long term (current) drug therapy: Secondary | ICD-10-CM | POA: Insufficient documentation

## 2020-02-11 DIAGNOSIS — C772 Secondary and unspecified malignant neoplasm of intra-abdominal lymph nodes: Secondary | ICD-10-CM

## 2020-02-11 DIAGNOSIS — Z452 Encounter for adjustment and management of vascular access device: Secondary | ICD-10-CM | POA: Diagnosis not present

## 2020-02-11 DIAGNOSIS — Z5189 Encounter for other specified aftercare: Secondary | ICD-10-CM | POA: Insufficient documentation

## 2020-02-11 DIAGNOSIS — Z5111 Encounter for antineoplastic chemotherapy: Secondary | ICD-10-CM | POA: Insufficient documentation

## 2020-02-11 DIAGNOSIS — Z5112 Encounter for antineoplastic immunotherapy: Secondary | ICD-10-CM | POA: Insufficient documentation

## 2020-02-11 DIAGNOSIS — C786 Secondary malignant neoplasm of retroperitoneum and peritoneum: Secondary | ICD-10-CM | POA: Diagnosis not present

## 2020-02-11 DIAGNOSIS — C187 Malignant neoplasm of sigmoid colon: Secondary | ICD-10-CM | POA: Insufficient documentation

## 2020-02-11 DIAGNOSIS — T451X5A Adverse effect of antineoplastic and immunosuppressive drugs, initial encounter: Secondary | ICD-10-CM

## 2020-02-11 LAB — CBC WITH DIFFERENTIAL/PLATELET
Abs Immature Granulocytes: 0.05 10*3/uL (ref 0.00–0.07)
Basophils Absolute: 0.1 10*3/uL (ref 0.0–0.1)
Basophils Relative: 1 %
Eosinophils Absolute: 0.2 10*3/uL (ref 0.0–0.5)
Eosinophils Relative: 2 %
HCT: 44.6 % (ref 39.0–52.0)
Hemoglobin: 14.8 g/dL (ref 13.0–17.0)
Immature Granulocytes: 1 %
Lymphocytes Relative: 18 %
Lymphs Abs: 1.8 10*3/uL (ref 0.7–4.0)
MCH: 32.1 pg (ref 26.0–34.0)
MCHC: 33.2 g/dL (ref 30.0–36.0)
MCV: 96.7 fL (ref 80.0–100.0)
Monocytes Absolute: 0.9 10*3/uL (ref 0.1–1.0)
Monocytes Relative: 9 %
Neutro Abs: 7.3 10*3/uL (ref 1.7–7.7)
Neutrophils Relative %: 69 %
Platelets: 177 10*3/uL (ref 150–400)
RBC: 4.61 MIL/uL (ref 4.22–5.81)
RDW: 15.3 % (ref 11.5–15.5)
WBC: 10.3 10*3/uL (ref 4.0–10.5)
nRBC: 0 % (ref 0.0–0.2)

## 2020-02-11 LAB — COMPREHENSIVE METABOLIC PANEL
ALT: 51 U/L — ABNORMAL HIGH (ref 0–44)
AST: 55 U/L — ABNORMAL HIGH (ref 15–41)
Albumin: 3.6 g/dL (ref 3.5–5.0)
Alkaline Phosphatase: 139 U/L — ABNORMAL HIGH (ref 38–126)
Anion gap: 9 (ref 5–15)
BUN: 26 mg/dL — ABNORMAL HIGH (ref 8–23)
CO2: 23 mmol/L (ref 22–32)
Calcium: 9.2 mg/dL (ref 8.9–10.3)
Chloride: 103 mmol/L (ref 98–111)
Creatinine, Ser: 0.77 mg/dL (ref 0.61–1.24)
GFR calc Af Amer: >60 mL/min (ref >60)
GFR calc non Af Amer: >60 mL/min (ref >60)
Glucose, Bld: 94 mg/dL (ref 70–99)
Potassium: 4 mmol/L (ref 3.5–5.1)
Sodium: 135 mmol/L (ref 135–145)
Total Bilirubin: 0.8 mg/dL (ref 0.3–1.2)
Total Protein: 7.4 g/dL (ref 6.5–8.1)

## 2020-02-11 MED ORDER — PALONOSETRON HCL INJECTION 0.25 MG/5ML
0.2500 mg | Freq: Once | INTRAVENOUS | Status: AC
  Administered 2020-02-11: 0.25 mg via INTRAVENOUS
  Filled 2020-02-11: qty 5

## 2020-02-11 MED ORDER — FLUOROURACIL CHEMO INJECTION 2.5 GM/50ML
400.0000 mg/m2 | Freq: Once | INTRAVENOUS | Status: AC
  Administered 2020-02-11: 700 mg via INTRAVENOUS
  Filled 2020-02-11: qty 14

## 2020-02-11 MED ORDER — SODIUM CHLORIDE 0.9% FLUSH
10.0000 mL | Freq: Once | INTRAVENOUS | Status: AC
  Administered 2020-02-11: 10 mL via INTRAVENOUS
  Filled 2020-02-11: qty 10

## 2020-02-11 MED ORDER — SODIUM CHLORIDE 0.9 % IV SOLN
4.4000 mg/kg | Freq: Once | INTRAVENOUS | Status: AC
  Administered 2020-02-11: 300 mg via INTRAVENOUS
  Filled 2020-02-11: qty 12

## 2020-02-11 MED ORDER — SODIUM CHLORIDE 0.9 % IV SOLN
2400.0000 mg/m2 | INTRAVENOUS | Status: DC
  Administered 2020-02-11: 4300 mg via INTRAVENOUS
  Filled 2020-02-11: qty 86

## 2020-02-11 MED ORDER — DEXAMETHASONE SODIUM PHOSPHATE 10 MG/ML IJ SOLN
10.0000 mg | Freq: Once | INTRAMUSCULAR | Status: AC
  Administered 2020-02-11: 10 mg via INTRAVENOUS
  Filled 2020-02-11: qty 1

## 2020-02-11 MED ORDER — SODIUM CHLORIDE 0.9 % IV SOLN
INTRAVENOUS | Status: DC
  Filled 2020-02-11: qty 250

## 2020-02-11 MED ORDER — LEUCOVORIN CALCIUM INJECTION 350 MG
700.0000 mg | Freq: Once | INTRAVENOUS | Status: AC
  Administered 2020-02-11: 700 mg via INTRAVENOUS
  Filled 2020-02-11: qty 25

## 2020-02-11 NOTE — Progress Notes (Signed)
No Udenyca today since oxal is taken out of plan per MD

## 2020-02-11 NOTE — Progress Notes (Signed)
Hematology/Oncology Consult note [facility]  Telephone:([phone] [phone]  Patient Care Team: Jodi Marble, MD as PCP - General (Internal Medicine) Clent Jacks, RN as Oncology Nurse Navigator   Name of the patient: Eric Simon  [id number]  12-10-1951   Date of visit: 02/11/20  Diagnosis- metastatic colon cancer with lymph node metastases K-ras/BRAF wild-type  Chief complaint/ Reason for visit-on treatment assessment prior to cycle 14 of 5-FU Mvasi chemotherapy  Heme/Onc history: Patient is a 69 yr old male with >40 pack year history of smoking. He currently smokes 0.5ppd. he presented to the ER with symptoms of left sided chest pain and left arm pain. Troponin was negative ekg was unremarkable. Ct chest showed no PE. He was incidentally noted to have mediastinal and retrocrural adenopathy and a 2.1X2.3X4.4 cm retroaortic soft tissue lesion in the posterior left chest. 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 Low-grade activity in the left lateral fifth and sixth ribs associated with nondisplaced fractures likely reflecting healing response problem malignancy.  Patient underwent CT-guided biopsy of the retroperitoneal lymph node pathology showed metastatic adenocarcinoma compatible with colorectal origin. CK7 negative. CK20 positive. CDX 2+. TTF-1 negative. PSA negative. This pattern of immunoreactivity supports the above diagnosis. Patient underwent colonoscopy which showed sigmoid mass that was consistent with adenocarcinoma. Rast panel testing showed that he was wild-type for both K-ras and BRAF   Interval history-reports that tingling numbness in his hands and feet is slowly getting worse.  He has been on Cymbalta 60 mg daily for the last 2 weeks but does not feel that it is helping him much just  yet.  He has lost about 4 pounds in the last 2 months.  Reports that his appetite is good.  ECOG PS- 1 Pain scale- 0   Review of systems- Review of Systems  Constitutional: Positive for malaise/fatigue and weight loss. Negative for chills and fever.  HENT: Negative for congestion, ear discharge and nosebleeds.   Eyes: Negative for blurred vision.  Respiratory: Negative for cough, hemoptysis, sputum production, shortness of breath and wheezing.   Cardiovascular: Negative for chest pain, palpitations, orthopnea and claudication.  Gastrointestinal: Negative for abdominal pain, blood in stool, constipation, diarrhea, heartburn, melena, nausea and vomiting.  Genitourinary: Negative for dysuria, flank pain, frequency, hematuria and urgency.  Musculoskeletal: Negative for back pain, joint pain and myalgias.  Skin: Negative for rash.  Neurological: Positive for sensory change (Peripheral neuropathy). Negative for dizziness, tingling, focal weakness, seizures, weakness and headaches.  Endo/Heme/Allergies: Does not bruise/bleed easily.  Psychiatric/Behavioral: Negative for depression and suicidal ideas. The patient does not have insomnia.      No Known Allergies   Past Medical History:  Diagnosis Date  . Colon cancer ([place])   . COPD (chronic obstructive pulmonary disease) ([place])   . Hyperlipidemia      Past Surgical History:  Procedure Laterality Date  . COLONOSCOPY WITH PROPOFOL N/A 06/26/2019   Procedure: COLONOSCOPY WITH PROPOFOL;  Surgeon: [name], MD;  Location[facility] ENDOSCOPY;  Service: Gastroenterology;  Laterality: N/A;  . PORTA CATH INSERTION N/A 06/25/2019   Procedure: PORTA CATH INSERTION;  Surgeon: Algernon Huxley, MD;  Location: [facility] LAB;  Service: Cardiovascular;  Laterality: N/A;    Social History   Socioeconomic History  . Marital status: Single    Spouse name: Not on file  . Number of children: Not  on file  . Years of education: Not on file  . Highest  education level: Not on file  Occupational History  . Not on file  Tobacco Use  . Smoking status: Current Every Day Smoker    Packs/day: 0.50    Years: 40.00    Pack years: 20.00    Types: Cigarettes  . Smokeless tobacco: Never Used  . Tobacco comment: smoking 40 years  Substance and Sexual Activity  . Alcohol use: No  . Drug use: No  . Sexual activity: Not Currently  Other Topics Concern  . Not on file  Social History Narrative  . Not on file   Social Determinants of Health   Financial Resource Strain: Low Risk   . Difficulty of Paying Living Expenses: Not hard at all  Food Insecurity: No Food Insecurity  . Worried About Charity fundraiser in the Last Year: Never true  . Ran Out of Food in the Last Year: Never true  Transportation Needs: No Transportation Needs  . Lack of Transportation (Medical): No  . Lack of Transportation (Non-Medical): No  Physical Activity:   . Days of Exercise per Week: Not on file  . Minutes of Exercise per Session: Not on file  Stress: No Stress Concern Present  . Feeling of Stress : Not at all  Social Connections: Unknown  . Frequency of Communication with Friends and Family: More than three times a week  . Frequency of Social Gatherings with Friends and Family: Not on file  . Attends Religious Services: Not on file  . Active Member of Clubs or Organizations: Not on file  . Attends Archivist Meetings: Not on file  . Marital Status: Not on file  Intimate Partner Violence: Not At Risk  . Fear of Current or Ex-Partner: No  . Emotionally Abused: No  . Physically Abused: No  . Sexually Abused: No    Family History  Problem Relation Age of Onset  . Brain cancer Father      Current Outpatient Medications:  .  aspirin EC 81 MG tablet, Take 81 mg by mouth daily., Disp: , Rfl:  .  azelastine (ASTELIN) 0.1 % nasal spray, Place 1 spray into both nostrils 1 day or 1 dose., Disp: , Rfl:  .  cetirizine (ZYRTEC) 10 MG tablet, Take 1  tablet (10 mg total) by mouth daily. (Patient taking differently: Take 10 mg by mouth daily as needed. ), Disp: 30 tablet, Rfl: 0 .  chlorthalidone (HYGROTON) 25 MG tablet, Take 1 tablet by mouth daily., Disp: , Rfl:  .  dexamethasone (DECADRON) 4 MG tablet, Take 2 tablets (8 mg total) by mouth daily. Start the day after chemotherapy for 2 days. Take with food., Disp: 30 tablet, Rfl: 1 .  DEXILANT 60 MG capsule, Take 1 capsule by mouth 1 day or 1 dose., Disp: , Rfl:  .  DULoxetine (CYMBALTA) 30 MG capsule, Take 1 capsule (30 mg total) by mouth as directed. 1 capsule daily x 7 days and then increase 2 tablets daily for rest of Rx, then will give new rx for 60 mg after this, Disp: 61 capsule, Rfl: 0 .  fluticasone (FLONASE) 50 MCG/ACT nasal spray, Place 2 sprays into both nostrils 1 day or 1 dose., Disp: , Rfl:  .  fluticasone (VERAMYST) 27.5 MCG/SPRAY nasal spray, Place 2 sprays into the nose daily., Disp: , Rfl:  .  lidocaine-prilocaine (EMLA) cream, Apply to affected area once, Disp: 30 g, Rfl: 3 .  magic mouthwash w/lidocaine SOLN, Take 5-10 mLs by mouth 4 (four) times daily as needed for mouth pain., Disp: 480 mL, Rfl: 3 .  montelukast (SINGULAIR) 10 MG tablet, Take 10 mg by mouth at bedtime., Disp: , Rfl:  .  nicotine (NICODERM CQ - DOSED IN MG/24 HOURS) 21 mg/24hr patch, Place 1 patch onto the skin 1 day or 1 dose., Disp: , Rfl:  .  ondansetron (ZOFRAN) 8 MG tablet, Take 1 tablet (8 mg total) by mouth 2 (two) times daily as needed for refractory nausea / vomiting. Start on day 3 after chemotherapy., Disp: 30 tablet, Rfl: 1 .  pantoprazole (PROTONIX) 40 MG tablet, Take 40 mg by mouth daily., Disp: , Rfl:  .  potassium chloride SA (K-DUR) 20 MEQ tablet, Take 1 tablet (20 mEq total) by mouth daily., Disp: 14 tablet, Rfl: 0 .  prochlorperazine (COMPAZINE) 10 MG tablet, Take 1 tablet (10 mg total) by mouth every 6 (six) hours as needed (Nausea or vomiting)., Disp: 30 tablet, Rfl: 1 .  simvastatin  (ZOCOR) 20 MG tablet, Take 20 mg by mouth daily., Disp: , Rfl:  .  sucralfate (CARAFATE) 1 g tablet, Take 1 g by mouth 4 (four) times daily., Disp: , Rfl:  .  SYMBICORT 80-4.5 MCG/ACT inhaler, Inhale 2 puffs into the lungs 1 day or 1 dose., Disp: , Rfl:  .  tamsulosin (FLOMAX) 0.4 MG CAPS capsule, Take 0.4 mg by mouth., Disp: , Rfl:  No current facility-administered medications for this visit.  Facility-Administered Medications Ordered in Other Visits:  .  0.9 %  sodium chloride infusion, , Intravenous, Continuous, Sindy Guadeloupe, MD, Stopped at 02/11/20 1255 .  fluorouracil (ADRUCIL) 4,300 mg in sodium chloride 0.9 % 64 mL chemo infusion, 2,400 mg/m2 (Treatment Plan Recorded), Intravenous, 1 day or 1 dose, Sindy Guadeloupe, MD, 4,300 mg at 02/11/20 1258 .  sodium chloride flush (NS) 0.9 % injection 10 mL, 10 mL, Intracatheter, PRN, Sindy Guadeloupe, MD, 10 mL at 08/15/19 1405  Physical exam:  Vitals:   02/11/20 0846  BP: 140/76  Pulse: 81  Temp: 98.4 F (36.9 C)  TempSrc: Tympanic  SpO2: 99%  Weight: 135 lb (61.2 kg)  Height: '5\' 7"'  (1.702 m)   Physical Exam Constitutional:      General: He is not in acute distress. HENT:     Head: Normocephalic and atraumatic.  Eyes:     Pupils: Pupils are equal, round, and reactive to light.  Cardiovascular:     Rate and Rhythm: Normal rate and regular rhythm.     Heart sounds: Normal heart sounds.  Pulmonary:     Effort: Pulmonary effort is normal.     Breath sounds: Normal breath sounds.  Abdominal:     General: Bowel sounds are normal.     Palpations: Abdomen is soft.  Musculoskeletal:     Cervical back: Normal range of motion.  Skin:    General: Skin is warm and dry.  Neurological:     Mental Status: He is alert and oriented to person, place, and time.      CMP Latest Ref Rng & Units 02/11/2020  Glucose 70 - 99 mg/dL 94  BUN 8 - 23 mg/dL 26(H)  Creatinine 0.61 - 1.24 mg/dL 0.77  Sodium 135 - 145 mmol/L 135  Potassium 3.5 - 5.1  mmol/L 4.0  Chloride 98 - 111 mmol/L 103  CO2 22 - 32 mmol/L 23  Calcium 8.9 - 10.3 mg/dL 9.2  Total Protein 6.5 -  8.1 g/dL 7.4  Total Bilirubin 0.3 - 1.2 mg/dL 0.8  Alkaline Phos 38 - 126 U/L 139(H)  AST 15 - 41 U/L 55(H)  ALT 0 - 44 U/L 51(H)   CBC Latest Ref Rng & Units 02/11/2020  WBC 4.0 - 10.5 K/uL 10.3  Hemoglobin 13.0 - 17.0 g/dL 14.8  Hematocrit 39.0 - 52.0 % 44.6  Platelets 150 - 400 K/uL 177    No images are attached to the encounter.  CT Chest W Contrast  Result Date: 01/14/2020 CLINICAL DATA:  Colon cancer with lymph node metastases EXAM: CT CHEST, ABDOMEN, AND PELVIS WITH CONTRAST TECHNIQUE: Multidetector CT imaging of the chest, abdomen and pelvis was performed following the standard protocol during bolus administration of intravenous contrast. CONTRAST:  176m OMNIPAQUE IOHEXOL 300 MG/ML  SOLN COMPARISON:  None. CT chest abdomen pelvis dated 09/17/2019 FINDINGS: CT CHEST FINDINGS Cardiovascular: Heart is normal in size. No pericardial effusion. No evidence of thoracic aortic aneurysm. Atherosclerotic calcifications of the aortic arch. Mild coronary atherosclerosis of the LAD. Right chest port terminates at the cavoatrial junction. Mediastinum/Nodes: No suspicious mediastinal lymphadenopathy. Visualized thyroid is unremarkable. Lungs/Pleura: Moderate centrilobular and paraseptal emphysematous changes, upper lung predominant. Mild biapical pleural-parenchymal scarring. Mild dependent atelectasis in the bilateral upper and lower lobes, right lower lobe predominant. No focal consolidation. No suspicious pulmonary nodules. No pleural effusion or pneumothorax. Musculoskeletal: Very mild degenerative changes of the mid thoracic spine. CT ABDOMEN PELVIS FINDINGS Hepatobiliary: Liver is within normal limits. Gallbladder is underdistended. No intrahepatic or extrahepatic ductal dilatation. Pancreas: Within normal limits. Spleen: Within normal limits. Adrenals/Urinary Tract: Adrenal glands are  within normal limits. Subcentimeter bilateral renal cysts.  No hydronephrosis. Bladder is within normal limits. Stomach/Bowel: Stomach is within normal limits. No evidence of bowel obstruction. Normal appendix (series 2/image 79). Scattered left colonic diverticulosis, without evidence of diverticulitis. No colonic wall thickening or mass is evident on CT. Vascular/Lymphatic: No evidence of abdominal aortic aneurysm. Atherosclerotic calcifications of the abdominal aorta and branch vessels. Improving abdominal lymphadenopathy, including: --6 mm short axis aortocaval node (series 2/image 32), previously 8 mm --5 mm short axis left common iliac node (series 2/image 86), previously 9 mm --8 mm short axis left pelvic sidewall node (series 2/image 106), previously 10 mm Reproductive: Mild prostatomegaly. Other: No abdominopelvic ascites. Musculoskeletal: Mild degenerative changes at L5-S1. IMPRESSION: Small residual lymph nodes in the abdomen/pelvis, measuring up to 8 mm short axis, improved. No evidence of metastatic disease in the chest. No colonic wall thickening/mass is evident on CT in this patient with known colon cancer. Electronically Signed   By: SJulian HyM.D.   On: 01/14/2020 10:14   CT Abdomen Pelvis W Contrast  Result Date: 01/14/2020 CLINICAL DATA:  Colon cancer with lymph node metastases EXAM: CT CHEST, ABDOMEN, AND PELVIS WITH CONTRAST TECHNIQUE: Multidetector CT imaging of the chest, abdomen and pelvis was performed following the standard protocol during bolus administration of intravenous contrast. CONTRAST:  1054mOMNIPAQUE IOHEXOL 300 MG/ML  SOLN COMPARISON:  None. CT chest abdomen pelvis dated 09/17/2019 FINDINGS: CT CHEST FINDINGS Cardiovascular: Heart is normal in size. No pericardial effusion. No evidence of thoracic aortic aneurysm. Atherosclerotic calcifications of the aortic arch. Mild coronary atherosclerosis of the LAD. Right chest port terminates at the cavoatrial junction.  Mediastinum/Nodes: No suspicious mediastinal lymphadenopathy. Visualized thyroid is unremarkable. Lungs/Pleura: Moderate centrilobular and paraseptal emphysematous changes, upper lung predominant. Mild biapical pleural-parenchymal scarring. Mild dependent atelectasis in the bilateral upper and lower lobes, right lower lobe predominant. No focal consolidation.  No suspicious pulmonary nodules. No pleural effusion or pneumothorax. Musculoskeletal: Very mild degenerative changes of the mid thoracic spine. CT ABDOMEN PELVIS FINDINGS Hepatobiliary: Liver is within normal limits. Gallbladder is underdistended. No intrahepatic or extrahepatic ductal dilatation. Pancreas: Within normal limits. Spleen: Within normal limits. Adrenals/Urinary Tract: Adrenal glands are within normal limits. Subcentimeter bilateral renal cysts.  No hydronephrosis. Bladder is within normal limits. Stomach/Bowel: Stomach is within normal limits. No evidence of bowel obstruction. Normal appendix (series 2/image 79). Scattered left colonic diverticulosis, without evidence of diverticulitis. No colonic wall thickening or mass is evident on CT. Vascular/Lymphatic: No evidence of abdominal aortic aneurysm. Atherosclerotic calcifications of the abdominal aorta and branch vessels. Improving abdominal lymphadenopathy, including: --6 mm short axis aortocaval node (series 2/image 32), previously 8 mm --5 mm short axis left common iliac node (series 2/image 86), previously 9 mm --8 mm short axis left pelvic sidewall node (series 2/image 106), previously 10 mm Reproductive: Mild prostatomegaly. Other: No abdominopelvic ascites. Musculoskeletal: Mild degenerative changes at L5-S1. IMPRESSION: Small residual lymph nodes in the abdomen/pelvis, measuring up to 8 mm short axis, improved. No evidence of metastatic disease in the chest. No colonic wall thickening/mass is evident on CT in this patient with known colon cancer. Electronically Signed   By: Julian Hy M.D.   On: 01/14/2020 10:14     Assessment and plan- Patient is a 69 y.o. male with metastatic colon cancer TX NX M1 with generalized lymphadenopathy K-ras/beta wild type.He is here for on treatment assessment prior to cycle 14 of 5-FU Mvasi chemotherapy  Counts okay to proceed with cycle 14 of 5-FU Mvasi chemotherapy today.  Recent scans from 01/14/2020 low-volume disease with small residual lymph nodes in the abdomen and pelvis but no other evidence of metastatic disease.  I will see him in 3 weeks with CBC with differential, CMP and CEA for cycle 15.  Blood pressure is stable and urine from last time showed trace urine protein.  He is unable to give a urine sample today.  Abnormal weight loss: Cause is currently unclear he does not have significant disease burden from colon cancer.  He has a good appetite but has been losing weight despite that.  I encouraged him to continue to take 2-3 boost every day.  He has also been skipping breakfast and I have asked him to reinstitute breakfast on a daily basis.  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  He will continue to stay on Cymbalta 60 mg daily and I will reassess his symptoms in 3 weeks.  If neuropathy persists I will consider switching him from Cymbalta to gabapentin   Visit Diagnosis 1. Encounter for antineoplastic chemotherapy   2. Encounter for monoclonal antibody treatment for malignancy   3. 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 ([place])   4. Chemotherapy-induced peripheral neuropathy ([place])      Dr. Randa Evens, MD, MPH [facility] [phone] 02/11/2020 4:39 PM

## 2020-02-11 NOTE — Progress Notes (Signed)
Ok to proceed without UP today per MD

## 2020-02-13 ENCOUNTER — Inpatient Hospital Stay: Payer: Medicare HMO

## 2020-02-13 ENCOUNTER — Other Ambulatory Visit: Payer: Self-pay

## 2020-02-13 DIAGNOSIS — C772 Secondary and unspecified malignant neoplasm of intra-abdominal lymph nodes: Secondary | ICD-10-CM

## 2020-02-13 DIAGNOSIS — Z5112 Encounter for antineoplastic immunotherapy: Secondary | ICD-10-CM | POA: Diagnosis not present

## 2020-02-13 DIAGNOSIS — C189 Malignant neoplasm of colon, unspecified: Secondary | ICD-10-CM

## 2020-02-13 MED ORDER — HEPARIN SOD (PORK) LOCK FLUSH 100 UNIT/ML IV SOLN
500.0000 [IU] | Freq: Once | INTRAVENOUS | Status: AC | PRN
  Administered 2020-02-13: 500 [IU]
  Filled 2020-02-13: qty 5

## 2020-02-21 ENCOUNTER — Inpatient Hospital Stay (HOSPITAL_BASED_OUTPATIENT_CLINIC_OR_DEPARTMENT_OTHER): Payer: Medicare HMO | Admitting: Hospice and Palliative Medicine

## 2020-02-21 DIAGNOSIS — Z515 Encounter for palliative care: Secondary | ICD-10-CM | POA: Diagnosis not present

## 2020-02-21 DIAGNOSIS — C189 Malignant neoplasm of colon, unspecified: Secondary | ICD-10-CM | POA: Diagnosis not present

## 2020-02-21 DIAGNOSIS — C772 Secondary and unspecified malignant neoplasm of intra-abdominal lymph nodes: Secondary | ICD-10-CM | POA: Diagnosis not present

## 2020-02-21 NOTE — Progress Notes (Signed)
Virtual Visit via Telephone Note  I connected with Eric Simon on 02/21/20 at 10:30 AM EST by telephone and verified that I am speaking with the correct person using two identifiers.   I discussed the limitations, risks, security and privacy concerns of performing an evaluation and management service by telephone and the availability of in person appointments. I also discussed with the patient that there may be a patient responsible charge related to this service. The patient expressed understanding and agreed to proceed.   History of Present Illness: [name] is a 69 y.o. 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  Patient initiated treatment with FOLFOX Avastin chemotherapy.  He was referred to palliative care to discuss goals and manage ongoing symptoms.   Observations/Objective: I called and spoke with patient by phone.  He reports that he is doing well.  He denies any symptomatic complaints.  No significant changes or concerns today.  No issues with medications nor does he need refills.  Patient reports his appetite is good.  Performance status is stable.  He is sleeping well at night.  No depression or anxiety.  Assessment and Plan: Stage IV rectal cancer - on chemotherapy and followed by Dr. [name].  Symptomatically well controlled at present.  Will follow  Follow Up Instructions: Virtual visit 1 to 2 months   I discussed the assessment and treatment plan with the patient. The patient was provided an opportunity to ask questions and all were answered. The patient agreed with the plan and demonstrated an understanding of the instructions.   The patient was advised to call back or seek an in-person evaluation if the symptoms worsen or if the condition fails to improve as anticipated.  I provided 5 minutes of  non-face-to-face time during this encounter.   Irean Hong, NP

## 2020-02-29 NOTE — Progress Notes (Signed)
Called patient no answer.

## 2020-03-03 ENCOUNTER — Inpatient Hospital Stay: Payer: Medicare HMO

## 2020-03-03 ENCOUNTER — Encounter: Payer: Self-pay | Admitting: Oncology

## 2020-03-03 ENCOUNTER — Other Ambulatory Visit: Payer: Self-pay | Admitting: *Deleted

## 2020-03-03 ENCOUNTER — Other Ambulatory Visit: Payer: Self-pay

## 2020-03-03 ENCOUNTER — Inpatient Hospital Stay (HOSPITAL_BASED_OUTPATIENT_CLINIC_OR_DEPARTMENT_OTHER): Payer: Medicare HMO | Admitting: Oncology

## 2020-03-03 VITALS — BP 127/75 | HR 90 | Temp 98.7°F | Ht 67.0 in | Wt 137.0 lb

## 2020-03-03 VITALS — Resp 18

## 2020-03-03 DIAGNOSIS — Z5111 Encounter for antineoplastic chemotherapy: Secondary | ICD-10-CM | POA: Diagnosis not present

## 2020-03-03 DIAGNOSIS — C189 Malignant neoplasm of colon, unspecified: Secondary | ICD-10-CM

## 2020-03-03 DIAGNOSIS — R7989 Other specified abnormal findings of blood chemistry: Secondary | ICD-10-CM

## 2020-03-03 DIAGNOSIS — T451X5A Adverse effect of antineoplastic and immunosuppressive drugs, initial encounter: Secondary | ICD-10-CM

## 2020-03-03 DIAGNOSIS — Z5112 Encounter for antineoplastic immunotherapy: Secondary | ICD-10-CM | POA: Diagnosis not present

## 2020-03-03 DIAGNOSIS — G620 Drug-induced polyneuropathy: Secondary | ICD-10-CM | POA: Diagnosis not present

## 2020-03-03 DIAGNOSIS — C772 Secondary and unspecified malignant neoplasm of intra-abdominal lymph nodes: Secondary | ICD-10-CM

## 2020-03-03 DIAGNOSIS — R945 Abnormal results of liver function studies: Secondary | ICD-10-CM | POA: Diagnosis not present

## 2020-03-03 LAB — COMPREHENSIVE METABOLIC PANEL
ALT: 51 U/L — ABNORMAL HIGH (ref 0–44)
AST: 58 U/L — ABNORMAL HIGH (ref 15–41)
Albumin: 3.7 g/dL (ref 3.5–5.0)
Alkaline Phosphatase: 130 U/L — ABNORMAL HIGH (ref 38–126)
Anion gap: 10 (ref 5–15)
BUN: 24 mg/dL — ABNORMAL HIGH (ref 8–23)
CO2: 24 mmol/L (ref 22–32)
Calcium: 9.2 mg/dL (ref 8.9–10.3)
Chloride: 101 mmol/L (ref 98–111)
Creatinine, Ser: 0.75 mg/dL (ref 0.61–1.24)
GFR calc Af Amer: >60 mL/min (ref >60)
GFR calc non Af Amer: >60 mL/min (ref >60)
Glucose, Bld: 166 mg/dL — ABNORMAL HIGH (ref 70–99)
Potassium: 3.5 mmol/L (ref 3.5–5.1)
Sodium: 135 mmol/L (ref 135–145)
Total Bilirubin: 0.8 mg/dL (ref 0.3–1.2)
Total Protein: 7.3 g/dL (ref 6.5–8.1)

## 2020-03-03 LAB — CBC WITH DIFFERENTIAL/PLATELET
Abs Immature Granulocytes: 0.02 10*3/uL (ref 0.00–0.07)
Basophils Absolute: 0.1 10*3/uL (ref 0.0–0.1)
Basophils Relative: 1 %
Eosinophils Absolute: 0.2 10*3/uL (ref 0.0–0.5)
Eosinophils Relative: 3 %
HCT: 44.1 % (ref 39.0–52.0)
Hemoglobin: 15.1 g/dL (ref 13.0–17.0)
Immature Granulocytes: 0 %
Lymphocytes Relative: 29 %
Lymphs Abs: 1.8 10*3/uL (ref 0.7–4.0)
MCH: 32.5 pg (ref 26.0–34.0)
MCHC: 34.2 g/dL (ref 30.0–36.0)
MCV: 94.8 fL (ref 80.0–100.0)
Monocytes Absolute: 0.6 10*3/uL (ref 0.1–1.0)
Monocytes Relative: 9 %
Neutro Abs: 3.5 10*3/uL (ref 1.7–7.7)
Neutrophils Relative %: 58 %
Platelets: 168 10*3/uL (ref 150–400)
RBC: 4.65 MIL/uL (ref 4.22–5.81)
RDW: 14.7 % (ref 11.5–15.5)
WBC: 6.1 10*3/uL (ref 4.0–10.5)
nRBC: 0 % (ref 0.0–0.2)

## 2020-03-03 LAB — PROTEIN, URINE, RANDOM: Total Protein, Urine: 15 mg/dL

## 2020-03-03 MED ORDER — DULOXETINE HCL 30 MG PO CPEP: 30.0000 mg | ORAL_CAPSULE | Freq: Two times a day (BID) | ORAL | 1 refills | Status: DC

## 2020-03-03 MED ORDER — PALONOSETRON HCL INJECTION 0.25 MG/5ML
0.2500 mg | Freq: Once | INTRAVENOUS | Status: AC
  Administered 2020-03-03: 0.25 mg via INTRAVENOUS
  Filled 2020-03-03: qty 5

## 2020-03-03 MED ORDER — FLUOROURACIL CHEMO INJECTION 2.5 GM/50ML
400.0000 mg/m2 | Freq: Once | INTRAVENOUS | Status: AC
  Administered 2020-03-03: 700 mg via INTRAVENOUS
  Filled 2020-03-03: qty 14

## 2020-03-03 MED ORDER — SODIUM CHLORIDE 0.9% FLUSH
10.0000 mL | INTRAVENOUS | Status: DC | PRN
  Administered 2020-03-03: 10 mL via INTRAVENOUS
  Filled 2020-03-03: qty 10

## 2020-03-03 MED ORDER — SODIUM CHLORIDE 0.9 % IV SOLN
2400.0000 mg/m2 | INTRAVENOUS | Status: DC
  Administered 2020-03-03: 4300 mg via INTRAVENOUS
  Filled 2020-03-03: qty 86

## 2020-03-03 MED ORDER — DEXAMETHASONE SODIUM PHOSPHATE 10 MG/ML IJ SOLN
10.0000 mg | Freq: Once | INTRAMUSCULAR | Status: AC
  Administered 2020-03-03: 10 mg via INTRAVENOUS
  Filled 2020-03-03: qty 1

## 2020-03-03 MED ORDER — SODIUM CHLORIDE 0.9 % IV SOLN
300.0000 mg | Freq: Once | INTRAVENOUS | Status: AC
  Administered 2020-03-03: 300 mg via INTRAVENOUS
  Filled 2020-03-03: qty 12

## 2020-03-03 MED ORDER — LEUCOVORIN CALCIUM INJECTION 350 MG
700.0000 mg | Freq: Once | INTRAVENOUS | Status: AC
  Administered 2020-03-03: 700 mg via INTRAVENOUS
  Filled 2020-03-03: qty 25

## 2020-03-03 MED ORDER — SODIUM CHLORIDE 0.9 % IV SOLN
Freq: Once | INTRAVENOUS | Status: AC
  Filled 2020-03-03: qty 250

## 2020-03-03 NOTE — Progress Notes (Signed)
Patient stated that his neuropathy on his fingers and feet are still tingling and throbbing all the time.

## 2020-03-04 NOTE — Progress Notes (Signed)
Hematology/Oncology Consult note [facility]  Telephone:([phone] [phone]  Patient Care Team: Jodi Marble, MD as PCP - General (Internal Medicine) Clent Jacks, RN as Oncology Nurse Navigator   Name of the patient: Eric Simon  [id number]  09/10/1951   Date of visit: 03/04/20  Diagnosis- metastatic colon cancer with lymph node metastases K-ras/BRAF wild-type  Chief complaint/ Reason for visit-on treatment assessment prior to cycle 15 of 5-FU Mvasi chemotherapy  Heme/Onc history: Patient is a 69 yr old male with >40 pack year history of smoking. He currently smokes 0.5ppd. he presented to the ER with symptoms of left sided chest pain and left arm pain. Troponin was negative ekg was unremarkable. Ct chest showed no PE. He was incidentally noted to have mediastinal and retrocrural adenopathy and a 2.1X2.3X4.4 cm retroaortic soft tissue lesion in the posterior left chest. 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 Low-grade activity in the left lateral fifth and sixth ribs associated with nondisplaced fractures likely reflecting healing response problem malignancy.  Patient underwent CT-guided biopsy of the retroperitoneal lymph node pathology showed metastatic adenocarcinoma compatible with colorectal origin. CK7 negative. CK20 positive. CDX 2+. TTF-1 negative. PSA negative. This pattern of immunoreactivity supports the above diagnosis. Patient underwent colonoscopy which showed sigmoid mass that was consistent with adenocarcinoma. RAS panel testing showed that he was wild-type for both K-ras and BRAF   Interval history-reports doing well overall.  He has picked up 2 pounds as compared to 3 weeks ago.  Appetite is stable.  Neuropathy is mild and stable.  ECOG PS- 1 Pain scale- 0   Review of systems-  Review of Systems  Constitutional: Positive for malaise/fatigue. Negative for chills, fever and weight loss.  HENT: Negative for congestion, ear discharge and nosebleeds.   Eyes: Negative for blurred vision.  Respiratory: Negative for cough, hemoptysis, sputum production, shortness of breath and wheezing.   Cardiovascular: Negative for chest pain, palpitations, orthopnea and claudication.  Gastrointestinal: Negative for abdominal pain, blood in stool, constipation, diarrhea, heartburn, melena, nausea and vomiting.  Genitourinary: Negative for dysuria, flank pain, frequency, hematuria and urgency.  Musculoskeletal: Negative for back pain, joint pain and myalgias.  Skin: Negative for rash.  Neurological: Positive for sensory change (Chemo-induced peripheral neuropathy). Negative for dizziness, tingling, focal weakness, seizures, weakness and headaches.  Endo/Heme/Allergies: Does not bruise/bleed easily.  Psychiatric/Behavioral: Negative for depression and suicidal ideas. The patient does not have insomnia.       No Known Allergies   Past Medical History:  Diagnosis Date  . Colon cancer ([place])   . COPD (chronic obstructive pulmonary disease) ([place])   . Hyperlipidemia      Past Surgical History:  Procedure Laterality Date  . COLONOSCOPY WITH PROPOFOL N/A 06/26/2019   Procedure: COLONOSCOPY WITH PROPOFOL;  Surgeon: [name], MD;  Location: [facility] ENDOSCOPY;  Service: Gastroenterology;  Laterality: N/A;  . PORTA CATH INSERTION N/A 06/25/2019   Procedure: PORTA CATH INSERTION;  Surgeon: Algernon Huxley, MD;  Location: [place] [facility];  Service: Cardiovascular;  Laterality: N/A;    Social History   Socioeconomic History  . Marital status: Single    Spouse name: Not on file  . Number of children: Not on file  . Years of education: Not on file  . Highest education level: Not on file  Occupational History  . Not on file  Tobacco Use  .  Smoking status: Current Every Day Smoker     Packs/day: 0.50    Years: 40.00    Pack years: 20.00    Types: Cigarettes  . Smokeless tobacco: Never Used  . Tobacco comment: smoking 40 years  Substance and Sexual Activity  . Alcohol use: No  . Drug use: No  . Sexual activity: Not Currently  Other Topics Concern  . Not on file  Social History Narrative  . Not on file   Social Determinants of Health   Financial Resource Strain: Low Risk   . Difficulty of Paying Living Expenses: Not hard at all  Food Insecurity: No Food Insecurity  . Worried About Charity fundraiser in the Last Year: Never true  . Ran Out of Food in the Last Year: Never true  Transportation Needs: No Transportation Needs  . Lack of Transportation (Medical): No  . Lack of Transportation (Non-Medical): No  Physical Activity:   . Days of Exercise per Week:   . Minutes of Exercise per Session:   Stress: No Stress Concern Present  . Feeling of Stress : Not at all  Social Connections: Unknown  . Frequency of Communication with Friends and Family: More than three times a week  . Frequency of Social Gatherings with Friends and Family: Not on file  . Attends Religious Services: Not on file  . Active Member of Clubs or Organizations: Not on file  . Attends Archivist Meetings: Not on file  . Marital Status: Not on file  Intimate Partner Violence: Not At Risk  . Fear of Current or Ex-Partner: No  . Emotionally Abused: No  . Physically Abused: No  . Sexually Abused: No    Family History  Problem Relation Age of Onset  . Brain cancer Father      Current Outpatient Medications:  .  aspirin EC 81 MG tablet, Take 81 mg by mouth daily., Disp: , Rfl:  .  azelastine (ASTELIN) 0.1 % nasal spray, Place 1 spray into both nostrils 1 day or 1 dose., Disp: , Rfl:  .  cetirizine (ZYRTEC) 10 MG tablet, Take 1 tablet (10 mg total) by mouth daily. (Patient taking differently: Take 10 mg by mouth daily as needed. ), Disp: 30 tablet, Rfl: 0 .  chlorthalidone  (HYGROTON) 25 MG tablet, Take 1 tablet by mouth daily., Disp: , Rfl:  .  dexamethasone (DECADRON) 4 MG tablet, Take 2 tablets (8 mg total) by mouth daily. Start the day after chemotherapy for 2 days. Take with food., Disp: 30 tablet, Rfl: 1 .  DEXILANT 60 MG capsule, Take 1 capsule by mouth 1 day or 1 dose., Disp: , Rfl:  .  fluticasone (FLONASE) 50 MCG/ACT nasal spray, Place 2 sprays into both nostrils 1 day or 1 dose., Disp: , Rfl:  .  fluticasone (VERAMYST) 27.5 MCG/SPRAY nasal spray, Place 2 sprays into the nose daily., Disp: , Rfl:  .  lidocaine-prilocaine (EMLA) cream, Apply to affected area once, Disp: 30 g, Rfl: 3 .  magic mouthwash w/lidocaine SOLN, Take 5-10 mLs by mouth 4 (four) times daily as needed for mouth pain., Disp: 480 mL, Rfl: 3 .  montelukast (SINGULAIR) 10 MG tablet, Take 10 mg by mouth at bedtime., Disp: , Rfl:  .  nicotine (NICODERM CQ - DOSED IN MG/24 HOURS) 21 mg/24hr patch, Place 1 patch onto the skin 1 day or 1 dose., Disp: , Rfl:  .  ondansetron (ZOFRAN) 8 MG tablet, Take 1 tablet (8 mg total) by  mouth 2 (two) times daily as needed for refractory nausea / vomiting. Start on day 3 after chemotherapy., Disp: 30 tablet, Rfl: 1 .  pantoprazole (PROTONIX) 40 MG tablet, Take 40 mg by mouth daily., Disp: , Rfl:  .  potassium chloride SA (K-DUR) 20 MEQ tablet, Take 1 tablet (20 mEq total) by mouth daily., Disp: 14 tablet, Rfl: 0 .  prochlorperazine (COMPAZINE) 10 MG tablet, Take 1 tablet (10 mg total) by mouth every 6 (six) hours as needed (Nausea or vomiting)., Disp: 30 tablet, Rfl: 1 .  simvastatin (ZOCOR) 20 MG tablet, Take 20 mg by mouth daily., Disp: , Rfl:  .  sucralfate (CARAFATE) 1 g tablet, Take 1 g by mouth 4 (four) times daily., Disp: , Rfl:  .  SYMBICORT 80-4.5 MCG/ACT inhaler, Inhale 2 puffs into the lungs 1 day or 1 dose., Disp: , Rfl:  .  tamsulosin (FLOMAX) 0.4 MG CAPS capsule, Take 0.4 mg by mouth., Disp: , Rfl:  .  DULoxetine (CYMBALTA) 30 MG capsule, Take 1  capsule (30 mg total) by mouth 2 (two) times daily., Disp: 60 capsule, Rfl: 1 No current facility-administered medications for this visit.  Facility-Administered Medications Ordered in Other Visits:  .  sodium chloride flush (NS) 0.9 % injection 10 mL, 10 mL, Intracatheter, PRN, Sindy Guadeloupe, MD, 10 mL at 08/15/19 1405  Physical exam:  Vitals:   03/03/20 0847  BP: 127/75  Pulse: 90  Temp: 98.7 F (37.1 C)  TempSrc: Tympanic  SpO2: 100%  Weight: 137 lb (62.1 kg)  Height: '5\' 7"'  (1.702 m)   Physical Exam Constitutional:      General: He is not in acute distress. HENT:     Head: Normocephalic and atraumatic.  Cardiovascular:     Rate and Rhythm: Normal rate and regular rhythm.     Heart sounds: Normal heart sounds.  Pulmonary:     Effort: Pulmonary effort is normal.     Breath sounds: Normal breath sounds.  Abdominal:     General: Bowel sounds are normal.     Palpations: Abdomen is soft.  Skin:    General: Skin is warm and dry.  Neurological:     Mental Status: He is alert and oriented to person, place, and time.      CMP Latest Ref Rng & Units 03/03/2020  Glucose 70 - 99 mg/dL 166(H)  BUN 8 - 23 mg/dL 24(H)  Creatinine 0.61 - 1.24 mg/dL 0.75  Sodium 135 - 145 mmol/L 135  Potassium 3.5 - 5.1 mmol/L 3.5  Chloride 98 - 111 mmol/L 101  CO2 22 - 32 mmol/L 24  Calcium 8.9 - 10.3 mg/dL 9.2  Total Protein 6.5 - 8.1 g/dL 7.3  Total Bilirubin 0.3 - 1.2 mg/dL 0.8  Alkaline Phos 38 - 126 U/L 130(H)  AST 15 - 41 U/L 58(H)  ALT 0 - 44 U/L 51(H)   CBC Latest Ref Rng & Units 03/03/2020  WBC 4.0 - 10.5 K/uL 6.1  Hemoglobin 13.0 - 17.0 g/dL 15.1  Hematocrit 39.0 - 52.0 % 44.1  Platelets 150 - 400 K/uL 168     Assessment and plan- Patient is a 69 y.o. male with metastatic colon cancer TX NX M1 with generalized lymphadenopathy K-ras/beta wild type.He is here for on treatment assessment prior to cycle 15 of 5-FU Mvasi chemotherapy  Counts okay to proceed with cycle 15 of  5-FU Mvasi chemotherapy today.He will receive Udenyca on day 3 of pump disconnect continue to monitor CBC is within normal  limits.  I will see him back in 3 weeks for cycle 16 of 5-FU Mvasi chemotherapy.  Blood pressure is stable and urine protein remains trace   Abnormal LFTs: Patient does have abnormal LFTs which are stable to trending down.  Possibly secondary to 5-FU.  Chemo-induced peripheral neuropathy:Continue Cymbalta   Visit Diagnosis 1. 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 ([place])   2. Encounter for antineoplastic chemotherapy   3. Chemotherapy-induced peripheral neuropathy ([place])   4. Abnormal LFTs      Dr. Randa Evens, MD, MPH [facility] [phone] 03/04/2020 10:26 AM

## 2020-03-05 ENCOUNTER — Inpatient Hospital Stay: Payer: Medicare HMO

## 2020-03-05 VITALS — BP 127/75 | HR 75 | Resp 20

## 2020-03-05 DIAGNOSIS — Z5112 Encounter for antineoplastic immunotherapy: Secondary | ICD-10-CM | POA: Diagnosis not present

## 2020-03-05 DIAGNOSIS — C189 Malignant neoplasm of colon, unspecified: Secondary | ICD-10-CM

## 2020-03-05 DIAGNOSIS — C772 Secondary and unspecified malignant neoplasm of intra-abdominal lymph nodes: Secondary | ICD-10-CM

## 2020-03-05 MED ORDER — HEPARIN SOD (PORK) LOCK FLUSH 100 UNIT/ML IV SOLN
INTRAVENOUS | Status: AC
  Filled 2020-03-05: qty 5

## 2020-03-05 MED ORDER — SODIUM CHLORIDE 0.9% FLUSH
10.0000 mL | INTRAVENOUS | Status: DC | PRN
  Administered 2020-03-05: 10 mL
  Filled 2020-03-05: qty 10

## 2020-03-05 MED ORDER — HEPARIN SOD (PORK) LOCK FLUSH 100 UNIT/ML IV SOLN
500.0000 [IU] | Freq: Once | INTRAVENOUS | Status: AC | PRN
  Administered 2020-03-05: 500 [IU]
  Filled 2020-03-05: qty 5

## 2020-03-05 MED ORDER — PEGFILGRASTIM-CBQV 6 MG/0.6ML ~~LOC~~ SOSY
6.0000 mg | PREFILLED_SYRINGE | Freq: Once | SUBCUTANEOUS | Status: AC
  Administered 2020-03-05: 6 mg via SUBCUTANEOUS
  Filled 2020-03-05: qty 0.6

## 2020-03-17 NOTE — Progress Notes (Signed)
Pharmacist Chemotherapy Monitoring - Follow Up Assessment    I verify that I have reviewed each item in the below checklist:  . Regimen for the patient is scheduled for the appropriate day and plan matches scheduled date. [name] Appropriate non-routine labs are ordered dependent on drug ordered. . If applicable, additional medications reviewed and ordered per protocol based on lifetime cumulative doses and/or treatment regimen.   Plan for follow-up and/or issues identified: No . I-vent associated with next due treatment: No . MD and/or nursing notified: No  Meghna Hagmann K 03/17/2020 11:36 AM

## 2020-03-24 ENCOUNTER — Other Ambulatory Visit: Payer: Self-pay

## 2020-03-24 ENCOUNTER — Inpatient Hospital Stay (HOSPITAL_BASED_OUTPATIENT_CLINIC_OR_DEPARTMENT_OTHER): Payer: Medicare HMO | Admitting: Oncology

## 2020-03-24 ENCOUNTER — Encounter: Payer: Self-pay | Admitting: Oncology

## 2020-03-24 ENCOUNTER — Inpatient Hospital Stay: Payer: Medicare HMO | Attending: Oncology

## 2020-03-24 ENCOUNTER — Inpatient Hospital Stay: Payer: Medicare HMO

## 2020-03-24 VITALS — BP 121/70 | HR 88 | Temp 96.9°F | Resp 18 | Wt 139.5 lb

## 2020-03-24 DIAGNOSIS — Z95828 Presence of other vascular implants and grafts: Secondary | ICD-10-CM

## 2020-03-24 DIAGNOSIS — G620 Drug-induced polyneuropathy: Secondary | ICD-10-CM | POA: Diagnosis not present

## 2020-03-24 DIAGNOSIS — Z5112 Encounter for antineoplastic immunotherapy: Secondary | ICD-10-CM | POA: Diagnosis not present

## 2020-03-24 DIAGNOSIS — C189 Malignant neoplasm of colon, unspecified: Secondary | ICD-10-CM | POA: Diagnosis not present

## 2020-03-24 DIAGNOSIS — T451X5A Adverse effect of antineoplastic and immunosuppressive drugs, initial encounter: Secondary | ICD-10-CM

## 2020-03-24 DIAGNOSIS — Z5111 Encounter for antineoplastic chemotherapy: Secondary | ICD-10-CM | POA: Insufficient documentation

## 2020-03-24 DIAGNOSIS — C786 Secondary malignant neoplasm of retroperitoneum and peritoneum: Secondary | ICD-10-CM | POA: Diagnosis not present

## 2020-03-24 DIAGNOSIS — C187 Malignant neoplasm of sigmoid colon: Secondary | ICD-10-CM | POA: Insufficient documentation

## 2020-03-24 DIAGNOSIS — C772 Secondary and unspecified malignant neoplasm of intra-abdominal lymph nodes: Secondary | ICD-10-CM

## 2020-03-24 DIAGNOSIS — R945 Abnormal results of liver function studies: Secondary | ICD-10-CM | POA: Diagnosis not present

## 2020-03-24 DIAGNOSIS — Z452 Encounter for adjustment and management of vascular access device: Secondary | ICD-10-CM | POA: Insufficient documentation

## 2020-03-24 DIAGNOSIS — Z5189 Encounter for other specified aftercare: Secondary | ICD-10-CM | POA: Diagnosis not present

## 2020-03-24 DIAGNOSIS — R7989 Other specified abnormal findings of blood chemistry: Secondary | ICD-10-CM

## 2020-03-24 LAB — COMPREHENSIVE METABOLIC PANEL
ALT: 50 U/L — ABNORMAL HIGH (ref 0–44)
AST: 53 U/L — ABNORMAL HIGH (ref 15–41)
Albumin: 3.6 g/dL (ref 3.5–5.0)
Alkaline Phosphatase: 134 U/L — ABNORMAL HIGH (ref 38–126)
Anion gap: 9 (ref 5–15)
BUN: 25 mg/dL — ABNORMAL HIGH (ref 8–23)
CO2: 23 mmol/L (ref 22–32)
Calcium: 9.2 mg/dL (ref 8.9–10.3)
Chloride: 104 mmol/L (ref 98–111)
Creatinine, Ser: 0.74 mg/dL (ref 0.61–1.24)
GFR calc Af Amer: >60 mL/min (ref >60)
GFR calc non Af Amer: >60 mL/min (ref >60)
Glucose, Bld: 123 mg/dL — ABNORMAL HIGH (ref 70–99)
Potassium: 3.9 mmol/L (ref 3.5–5.1)
Sodium: 136 mmol/L (ref 135–145)
Total Bilirubin: 0.8 mg/dL (ref 0.3–1.2)
Total Protein: 7.3 g/dL (ref 6.5–8.1)

## 2020-03-24 LAB — CBC WITH DIFFERENTIAL/PLATELET
Abs Immature Granulocytes: 0.07 10*3/uL (ref 0.00–0.07)
Basophils Absolute: 0.1 10*3/uL (ref 0.0–0.1)
Basophils Relative: 1 %
Eosinophils Absolute: 0.1 10*3/uL (ref 0.0–0.5)
Eosinophils Relative: 1 %
HCT: 44.3 % (ref 39.0–52.0)
Hemoglobin: 15.1 g/dL (ref 13.0–17.0)
Immature Granulocytes: 1 %
Lymphocytes Relative: 19 %
Lymphs Abs: 2 10*3/uL (ref 0.7–4.0)
MCH: 32.6 pg (ref 26.0–34.0)
MCHC: 34.1 g/dL (ref 30.0–36.0)
MCV: 95.7 fL (ref 80.0–100.0)
Monocytes Absolute: 0.8 10*3/uL (ref 0.1–1.0)
Monocytes Relative: 7 %
Neutro Abs: 7.7 10*3/uL (ref 1.7–7.7)
Neutrophils Relative %: 71 %
Platelets: 167 10*3/uL (ref 150–400)
RBC: 4.63 MIL/uL (ref 4.22–5.81)
RDW: 15.4 % (ref 11.5–15.5)
WBC: 10.7 10*3/uL — ABNORMAL HIGH (ref 4.0–10.5)
nRBC: 0 % (ref 0.0–0.2)

## 2020-03-24 MED ORDER — HEPARIN SOD (PORK) LOCK FLUSH 100 UNIT/ML IV SOLN
500.0000 [IU] | Freq: Once | INTRAVENOUS | Status: DC
  Filled 2020-03-24: qty 5

## 2020-03-24 MED ORDER — PALONOSETRON HCL INJECTION 0.25 MG/5ML
0.2500 mg | Freq: Once | INTRAVENOUS | Status: AC
  Administered 2020-03-24: 0.25 mg via INTRAVENOUS
  Filled 2020-03-24: qty 5

## 2020-03-24 MED ORDER — DEXAMETHASONE SODIUM PHOSPHATE 10 MG/ML IJ SOLN
10.0000 mg | Freq: Once | INTRAMUSCULAR | Status: AC
  Administered 2020-03-24: 10 mg via INTRAVENOUS
  Filled 2020-03-24: qty 1

## 2020-03-24 MED ORDER — SODIUM CHLORIDE 0.9 % IV SOLN
INTRAVENOUS | Status: DC
  Filled 2020-03-24: qty 250

## 2020-03-24 MED ORDER — FLUOROURACIL CHEMO INJECTION 2.5 GM/50ML
400.0000 mg/m2 | Freq: Once | INTRAVENOUS | Status: AC
  Administered 2020-03-24: 700 mg via INTRAVENOUS
  Filled 2020-03-24: qty 14

## 2020-03-24 MED ORDER — SODIUM CHLORIDE 0.9 % IV SOLN
4.4000 mg/kg | Freq: Once | INTRAVENOUS | Status: AC
  Administered 2020-03-24: 300 mg via INTRAVENOUS
  Filled 2020-03-24: qty 12

## 2020-03-24 MED ORDER — SODIUM CHLORIDE 0.9 % IV SOLN
2400.0000 mg/m2 | INTRAVENOUS | Status: DC
  Administered 2020-03-24: 4300 mg via INTRAVENOUS
  Filled 2020-03-24: qty 86

## 2020-03-24 MED ORDER — LEUCOVORIN CALCIUM INJECTION 350 MG
700.0000 mg | Freq: Once | INTRAVENOUS | Status: AC
  Administered 2020-03-24: 700 mg via INTRAVENOUS
  Filled 2020-03-24: qty 25

## 2020-03-24 MED ORDER — SODIUM CHLORIDE 0.9% FLUSH
10.0000 mL | Freq: Once | INTRAVENOUS | Status: DC
  Filled 2020-03-24: qty 10

## 2020-03-24 NOTE — Progress Notes (Signed)
Patient is here for follow up he is doing well no complaints  

## 2020-03-25 ENCOUNTER — Telehealth: Payer: Self-pay

## 2020-03-25 ENCOUNTER — Telehealth: Payer: Self-pay | Admitting: *Deleted

## 2020-03-25 LAB — CEA: CEA: 5.4 ng/mL — ABNORMAL HIGH (ref 0.0–4.7)

## 2020-03-25 MED ORDER — PREGABALIN 75 MG PO CAPS: 75.0000 mg | ORAL_CAPSULE | Freq: Two times a day (BID) | ORAL | 2 refills | Status: DC

## 2020-03-25 NOTE — Telephone Encounter (Signed)
Patient's Lyrica was sent to his pharmacy today per Dr. [name].

## 2020-03-25 NOTE — Telephone Encounter (Signed)
Pharmacy called stating that patient said he was to have prescription sent in for his neuropathy yesterday and they have not received anything yet. Please advise

## 2020-03-26 ENCOUNTER — Other Ambulatory Visit: Payer: Self-pay

## 2020-03-26 ENCOUNTER — Inpatient Hospital Stay: Payer: Medicare HMO

## 2020-03-26 VITALS — BP 113/68 | HR 85

## 2020-03-26 DIAGNOSIS — C772 Secondary and unspecified malignant neoplasm of intra-abdominal lymph nodes: Secondary | ICD-10-CM

## 2020-03-26 DIAGNOSIS — Z5112 Encounter for antineoplastic immunotherapy: Secondary | ICD-10-CM | POA: Diagnosis not present

## 2020-03-26 DIAGNOSIS — C189 Malignant neoplasm of colon, unspecified: Secondary | ICD-10-CM

## 2020-03-26 MED ORDER — HEPARIN SOD (PORK) LOCK FLUSH 100 UNIT/ML IV SOLN
500.0000 [IU] | Freq: Once | INTRAVENOUS | Status: AC | PRN
  Administered 2020-03-26: 500 [IU]
  Filled 2020-03-26: qty 5

## 2020-03-26 MED ORDER — SODIUM CHLORIDE 0.9% FLUSH
10.0000 mL | INTRAVENOUS | Status: DC | PRN
  Administered 2020-03-26: 10 mL
  Filled 2020-03-26: qty 10

## 2020-03-26 MED ORDER — PEGFILGRASTIM-CBQV 6 MG/0.6ML ~~LOC~~ SOSY
6.0000 mg | PREFILLED_SYRINGE | Freq: Once | SUBCUTANEOUS | Status: AC
  Administered 2020-03-26: 6 mg via SUBCUTANEOUS
  Filled 2020-03-26: qty 0.6

## 2020-03-26 MED ORDER — HEPARIN SOD (PORK) LOCK FLUSH 100 UNIT/ML IV SOLN
INTRAVENOUS | Status: AC
  Filled 2020-03-26: qty 5

## 2020-03-26 NOTE — Progress Notes (Signed)
.     Hematology/Oncology Consult note [facility]  [phone] [phone]  Patient Care Team: Jodi Marble, MD as PCP - General (Internal Medicine) Clent Jacks, RN as Oncology Nurse Navigator   Name of the patient: Eric Simon  [id number]  1951-04-12   Date of visit: 03/26/20  Diagnosis- metastatic colon cancer with lymph node metastases K-ras/BRAF wild-type  Chief complaint/ Reason for visit-on treatment assessment prior to cycle 16 of 5-FU Mvasi chemotherapy  Heme/Onc history: Patient is a 69 yr old male with >40 pack year history of smoking. He currently smokes 0.5ppd. he presented to the ER with symptoms of left sided chest pain and left arm pain. Troponin was negative ekg was unremarkable. Ct chest showed no PE. He was incidentally noted to have mediastinal and retrocrural adenopathy and a 2.1X2.3X4.4 cm retroaortic soft tissue lesion in the posterior left chest. 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 Low-grade activity in the left lateral fifth and sixth ribs associated with nondisplaced fractures likely reflecting healing response problem malignancy.  Patient underwent CT-guided biopsy of the retroperitoneal lymph node pathology showed metastatic adenocarcinoma compatible with colorectal origin. CK7 negative. CK20 positive. CDX 2+. TTF-1 negative. PSA negative. This pattern of immunoreactivity supports the above diagnosis. Patient underwent colonoscopy which showed sigmoid mass that was consistent with adenocarcinoma. RAS panel testing showed that he was wild-type for both K-ras and BRAF  Interval history-patient reports neuropathy in his legs and feet is getting worse.he has tried Cymbalta but it is not helping him as much.  9 no complaints  ECOG PS- 1 Pain scale- 0   Review of systems-  Review of Systems  Constitutional: Positive for malaise/fatigue. Negative for chills, fever and weight loss.  HENT: Negative for congestion, ear discharge and nosebleeds.   Eyes: Negative for blurred vision.  Respiratory: Negative for cough, hemoptysis, sputum production, shortness of breath and wheezing.   Cardiovascular: Negative for chest pain, palpitations, orthopnea and claudication.  Gastrointestinal: Negative for abdominal pain, blood in stool, constipation, diarrhea, heartburn, melena, nausea and vomiting.  Genitourinary: Negative for dysuria, flank pain, frequency, hematuria and urgency.  Musculoskeletal: Negative for back pain, joint pain and myalgias.  Skin: Negative for rash.  Neurological: Positive for sensory change (Chemo-induced peripheral neuropathy). Negative for dizziness, tingling, focal weakness, seizures, weakness and headaches.  Endo/Heme/Allergies: Does not bruise/bleed easily.  Psychiatric/Behavioral: Negative for depression and suicidal ideas. The patient does not have insomnia.        No Known Allergies   Past Medical History:  Diagnosis Date  . Colon cancer ([place])   . COPD (chronic obstructive pulmonary disease) ([place])   . Hyperlipidemia      Past Surgical History:  Procedure Laterality Date  . COLONOSCOPY WITH PROPOFOL N/A 06/26/2019   Procedure: COLONOSCOPY WITH PROPOFOL;  Surgeon: [name], MD;  Location: [facility] ENDOSCOPY;  Service: Gastroenterology;  Laterality: N/A;  . PORTA CATH INSERTION N/A 06/25/2019   Procedure: PORTA CATH INSERTION;  Surgeon: Algernon Huxley, MD;  Location: [place] [facility];  Service: Cardiovascular;  Laterality: N/A;    Social History   Socioeconomic History  . Marital status: Single    Spouse name: Not on file  . Number of children: Not on file  . Years of education: Not on file  . Highest education level: Not on file  Occupational History  . Not on file  Tobacco Use  .  Smoking status: Current Every Day Smoker     Packs/day: 0.50    Years: 40.00    Pack years: 20.00    Types: Cigarettes  . Smokeless tobacco: Never Used  . Tobacco comment: smoking 40 years  Substance and Sexual Activity  . Alcohol use: No  . Drug use: No  . Sexual activity: Not Currently  Other Topics Concern  . Not on file  Social History Narrative  . Not on file   Social Determinants of Health   Financial Resource Strain: Low Risk   . Difficulty of Paying Living Expenses: Not hard at all  Food Insecurity: No Food Insecurity  . Worried About Charity fundraiser in the Last Year: Never true  . Ran Out of Food in the Last Year: Never true  Transportation Needs: No Transportation Needs  . Lack of Transportation (Medical): No  . Lack of Transportation (Non-Medical): No  Physical Activity:   . Days of Exercise per Week:   . Minutes of Exercise per Session:   Stress: No Stress Concern Present  . Feeling of Stress : Not at all  Social Connections: Unknown  . Frequency of Communication with Friends and Family: More than three times a week  . Frequency of Social Gatherings with Friends and Family: Not on file  . Attends Religious Services: Not on file  . Active Member of Clubs or Organizations: Not on file  . Attends Archivist Meetings: Not on file  . Marital Status: Not on file  Intimate Partner Violence: Not At Risk  . Fear of Current or Ex-Partner: No  . Emotionally Abused: No  . Physically Abused: No  . Sexually Abused: No    Family History  Problem Relation Age of Onset  . Brain cancer Father      Current Outpatient Medications:  .  aspirin EC 81 MG tablet, Take 81 mg by mouth daily., Disp: , Rfl:  .  azelastine (ASTELIN) 0.1 % nasal spray, Place 1 spray into both nostrils 1 day or 1 dose., Disp: , Rfl:  .  cetirizine (ZYRTEC) 10 MG tablet, Take 1 tablet (10 mg total) by mouth daily. (Patient taking differently: Take 10 mg by mouth daily as needed. ), Disp: 30 tablet, Rfl: 0 .  chlorthalidone  (HYGROTON) 25 MG tablet, Take 1 tablet by mouth daily., Disp: , Rfl:  .  dexamethasone (DECADRON) 4 MG tablet, Take 2 tablets (8 mg total) by mouth daily. Start the day after chemotherapy for 2 days. Take with food., Disp: 30 tablet, Rfl: 1 .  DEXILANT 60 MG capsule, Take 1 capsule by mouth 1 day or 1 dose., Disp: , Rfl:  .  DULoxetine (CYMBALTA) 30 MG capsule, Take 1 capsule (30 mg total) by mouth 2 (two) times daily., Disp: 60 capsule, Rfl: 1 .  fluticasone (FLONASE) 50 MCG/ACT nasal spray, Place 2 sprays into both nostrils 1 day or 1 dose., Disp: , Rfl:  .  fluticasone (VERAMYST) 27.5 MCG/SPRAY nasal spray, Place 2 sprays into the nose daily., Disp: , Rfl:  .  lidocaine-prilocaine (EMLA) cream, Apply to affected area once, Disp: 30 g, Rfl: 3 .  magic mouthwash w/lidocaine SOLN, Take 5-10 mLs by mouth 4 (four) times daily as needed for mouth pain., Disp: 480 mL, Rfl: 3 .  montelukast (SINGULAIR) 10 MG tablet, Take 10 mg by mouth at bedtime., Disp: , Rfl:  .  nicotine (NICODERM CQ - DOSED IN MG/24 HOURS) 21 mg/24hr patch, Place 1 patch onto the  skin 1 day or 1 dose., Disp: , Rfl:  .  ondansetron (ZOFRAN) 8 MG tablet, Take 1 tablet (8 mg total) by mouth 2 (two) times daily as needed for refractory nausea / vomiting. Start on day 3 after chemotherapy., Disp: 30 tablet, Rfl: 1 .  pantoprazole (PROTONIX) 40 MG tablet, Take 40 mg by mouth daily., Disp: , Rfl:  .  potassium chloride SA (K-DUR) 20 MEQ tablet, Take 1 tablet (20 mEq total) by mouth daily., Disp: 14 tablet, Rfl: 0 .  prochlorperazine (COMPAZINE) 10 MG tablet, Take 1 tablet (10 mg total) by mouth every 6 (six) hours as needed (Nausea or vomiting)., Disp: 30 tablet, Rfl: 1 .  simvastatin (ZOCOR) 20 MG tablet, Take 20 mg by mouth daily., Disp: , Rfl:  .  sucralfate (CARAFATE) 1 g tablet, Take 1 g by mouth 4 (four) times daily., Disp: , Rfl:  .  SYMBICORT 80-4.5 MCG/ACT inhaler, Inhale 2 puffs into the lungs 1 day or 1 dose., Disp: , Rfl:  .   tamsulosin (FLOMAX) 0.4 MG CAPS capsule, Take 0.4 mg by mouth., Disp: , Rfl:  .  pregabalin (LYRICA) 75 MG capsule, Take 1 capsule (75 mg total) by mouth 2 (two) times daily., Disp: 60 capsule, Rfl: 2 No current facility-administered medications for this visit.  Facility-Administered Medications Ordered in Other Visits:  .  sodium chloride flush (NS) 0.9 % injection 10 mL, 10 mL, Intracatheter, PRN, Sindy Guadeloupe, MD, 10 mL at 08/15/19 1405 .  sodium chloride flush (NS) 0.9 % injection 10 mL, 10 mL, Intracatheter, PRN, Sindy Guadeloupe, MD, 10 mL at 03/26/20 1307  Physical exam:  Vitals:   03/24/20 0858  BP: 121/70  Pulse: 88  Resp: 18  Temp: (!) 96.9 F (36.1 C)  TempSrc: Tympanic  SpO2: 99%  Weight: 139 lb 8 oz (63.3 kg)   Physical Exam   CMP Latest Ref Rng & Units 03/24/2020  Glucose 70 - 99 mg/dL 123(H)  BUN 8 - 23 mg/dL 25(H)  Creatinine 0.61 - 1.24 mg/dL 0.74  Sodium 135 - 145 mmol/L 136  Potassium 3.5 - 5.1 mmol/L 3.9  Chloride 98 - 111 mmol/L 104  CO2 22 - 32 mmol/L 23  Calcium 8.9 - 10.3 mg/dL 9.2  Total Protein 6.5 - 8.1 g/dL 7.3  Total Bilirubin 0.3 - 1.2 mg/dL 0.8  Alkaline Phos 38 - 126 U/L 134(H)  AST 15 - 41 U/L 53(H)  ALT 0 - 44 U/L 50(H)   CBC Latest Ref Rng & Units 03/24/2020  WBC 4.0 - 10.5 K/uL 10.7(H)  Hemoglobin 13.0 - 17.0 g/dL 15.1  Hematocrit 39.0 - 52.0 % 44.3  Platelets 150 - 400 K/uL 167    Assessment and plan- Patient is a 69 y.o. male with metastatic colon cancer TX NX M1 with generalized lymphadenopathy K-ras/beta wild type.  He is here for on treatment assessment prior to cycle 16 of 5-FU Mvasi chemotherapy  Proceed with cycle 16 of 5-FU Mvasi chemotherapy today.  He will come back on day 3 for pump disconnect and receive Udenyca on that day.  I will see him back in 3 weeks for cycle 17.  We have been going to therapy every 3 weeks for better tolerance.  Abnormal LFTs: They still remain mildly elevated but overall stable.  Continue to  monitor  Chemo-induced peripheral neuropathy: We will add Lyrica 75 mg twice daily to Cymbalta   Visit Diagnosis 1. Encounter for antineoplastic chemotherapy   2. Chemotherapy-induced peripheral neuropathy (  [place])   3. 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 ([place])   4. Abnormal LFTs      Dr. Randa Evens, MD, MPH [facility] [phone] 03/26/2020 1:32 PM

## 2020-04-07 NOTE — Progress Notes (Signed)
Pharmacist Chemotherapy Monitoring - Follow Up Assessment    I verify that I have reviewed each item in the below checklist:  . Regimen for the patient is scheduled for the appropriate day and plan matches scheduled date. [name] Appropriate non-routine labs are ordered dependent on drug ordered. . If applicable, additional medications reviewed and ordered per protocol based on lifetime cumulative doses and/or treatment regimen.   Plan for follow-up and/or issues identified: Yes . I-vent associated with next due treatment: No . MD and/or nursing notified: Yes - need orders  Adelina Mings 04/07/2020 8:26 AM

## 2020-04-10 ENCOUNTER — Other Ambulatory Visit: Payer: Self-pay | Admitting: Oncology

## 2020-04-13 ENCOUNTER — Other Ambulatory Visit: Payer: Self-pay | Admitting: *Deleted

## 2020-04-13 DIAGNOSIS — C189 Malignant neoplasm of colon, unspecified: Secondary | ICD-10-CM

## 2020-04-13 DIAGNOSIS — C772 Secondary and unspecified malignant neoplasm of intra-abdominal lymph nodes: Secondary | ICD-10-CM

## 2020-04-14 ENCOUNTER — Inpatient Hospital Stay: Payer: Medicare HMO

## 2020-04-14 ENCOUNTER — Inpatient Hospital Stay (HOSPITAL_BASED_OUTPATIENT_CLINIC_OR_DEPARTMENT_OTHER): Payer: Medicare HMO | Admitting: Oncology

## 2020-04-14 ENCOUNTER — Encounter: Payer: Self-pay | Admitting: Oncology

## 2020-04-14 ENCOUNTER — Other Ambulatory Visit: Payer: Self-pay

## 2020-04-14 ENCOUNTER — Inpatient Hospital Stay: Payer: Medicare HMO | Attending: Oncology

## 2020-04-14 VITALS — BP 112/76 | HR 79 | Temp 97.1°F | Resp 16 | Wt 142.4 lb

## 2020-04-14 DIAGNOSIS — Z5189 Encounter for other specified aftercare: Secondary | ICD-10-CM | POA: Diagnosis not present

## 2020-04-14 DIAGNOSIS — C772 Secondary and unspecified malignant neoplasm of intra-abdominal lymph nodes: Secondary | ICD-10-CM

## 2020-04-14 DIAGNOSIS — G620 Drug-induced polyneuropathy: Secondary | ICD-10-CM

## 2020-04-14 DIAGNOSIS — E785 Hyperlipidemia, unspecified: Secondary | ICD-10-CM | POA: Insufficient documentation

## 2020-04-14 DIAGNOSIS — C189 Malignant neoplasm of colon, unspecified: Secondary | ICD-10-CM

## 2020-04-14 DIAGNOSIS — R945 Abnormal results of liver function studies: Secondary | ICD-10-CM

## 2020-04-14 DIAGNOSIS — J449 Chronic obstructive pulmonary disease, unspecified: Secondary | ICD-10-CM | POA: Diagnosis not present

## 2020-04-14 DIAGNOSIS — Z452 Encounter for adjustment and management of vascular access device: Secondary | ICD-10-CM | POA: Insufficient documentation

## 2020-04-14 DIAGNOSIS — Z5112 Encounter for antineoplastic immunotherapy: Secondary | ICD-10-CM | POA: Diagnosis present

## 2020-04-14 DIAGNOSIS — Z5111 Encounter for antineoplastic chemotherapy: Secondary | ICD-10-CM | POA: Insufficient documentation

## 2020-04-14 DIAGNOSIS — C786 Secondary malignant neoplasm of retroperitoneum and peritoneum: Secondary | ICD-10-CM | POA: Insufficient documentation

## 2020-04-14 DIAGNOSIS — T451X5A Adverse effect of antineoplastic and immunosuppressive drugs, initial encounter: Secondary | ICD-10-CM

## 2020-04-14 DIAGNOSIS — C187 Malignant neoplasm of sigmoid colon: Secondary | ICD-10-CM | POA: Diagnosis present

## 2020-04-14 DIAGNOSIS — R7989 Other specified abnormal findings of blood chemistry: Secondary | ICD-10-CM

## 2020-04-14 LAB — CBC WITH DIFFERENTIAL/PLATELET
Abs Immature Granulocytes: 0.06 10*3/uL (ref 0.00–0.07)
Basophils Absolute: 0.1 10*3/uL (ref 0.0–0.1)
Basophils Relative: 1 %
Eosinophils Absolute: 0.2 10*3/uL (ref 0.0–0.5)
Eosinophils Relative: 2 %
HCT: 43.2 % (ref 39.0–52.0)
Hemoglobin: 14.4 g/dL (ref 13.0–17.0)
Immature Granulocytes: 1 %
Lymphocytes Relative: 20 %
Lymphs Abs: 2 10*3/uL (ref 0.7–4.0)
MCH: 32.4 pg (ref 26.0–34.0)
MCHC: 33.3 g/dL (ref 30.0–36.0)
MCV: 97.1 fL (ref 80.0–100.0)
Monocytes Absolute: 0.7 10*3/uL (ref 0.1–1.0)
Monocytes Relative: 7 %
Neutro Abs: 7 10*3/uL (ref 1.7–7.7)
Neutrophils Relative %: 69 %
Platelets: 171 10*3/uL (ref 150–400)
RBC: 4.45 MIL/uL (ref 4.22–5.81)
RDW: 15.2 % (ref 11.5–15.5)
WBC: 9.9 10*3/uL (ref 4.0–10.5)
nRBC: 0 % (ref 0.0–0.2)

## 2020-04-14 LAB — COMPREHENSIVE METABOLIC PANEL
ALT: 50 U/L — ABNORMAL HIGH (ref 0–44)
AST: 54 U/L — ABNORMAL HIGH (ref 15–41)
Albumin: 3.5 g/dL (ref 3.5–5.0)
Alkaline Phosphatase: 144 U/L — ABNORMAL HIGH (ref 38–126)
Anion gap: 9 (ref 5–15)
BUN: 33 mg/dL — ABNORMAL HIGH (ref 8–23)
CO2: 24 mmol/L (ref 22–32)
Calcium: 9 mg/dL (ref 8.9–10.3)
Chloride: 104 mmol/L (ref 98–111)
Creatinine, Ser: 0.72 mg/dL (ref 0.61–1.24)
GFR calc Af Amer: >60 mL/min (ref >60)
GFR calc non Af Amer: >60 mL/min (ref >60)
Glucose, Bld: 143 mg/dL — ABNORMAL HIGH (ref 70–99)
Potassium: 3.9 mmol/L (ref 3.5–5.1)
Sodium: 137 mmol/L (ref 135–145)
Total Bilirubin: 0.6 mg/dL (ref 0.3–1.2)
Total Protein: 7.4 g/dL (ref 6.5–8.1)

## 2020-04-14 LAB — PROTEIN, URINE, RANDOM: Total Protein, Urine: 14 mg/dL

## 2020-04-14 MED ORDER — SODIUM CHLORIDE 0.9 % IV SOLN
Freq: Once | INTRAVENOUS | Status: AC
  Filled 2020-04-14: qty 250

## 2020-04-14 MED ORDER — PALONOSETRON HCL INJECTION 0.25 MG/5ML
0.2500 mg | Freq: Once | INTRAVENOUS | Status: AC
  Administered 2020-04-14: 0.25 mg via INTRAVENOUS
  Filled 2020-04-14: qty 5

## 2020-04-14 MED ORDER — DEXAMETHASONE SODIUM PHOSPHATE 10 MG/ML IJ SOLN
10.0000 mg | Freq: Once | INTRAMUSCULAR | Status: AC
  Administered 2020-04-14: 10 mg via INTRAVENOUS
  Filled 2020-04-14: qty 1

## 2020-04-14 MED ORDER — FLUOROURACIL CHEMO INJECTION 2.5 GM/50ML
400.0000 mg/m2 | Freq: Once | INTRAVENOUS | Status: AC
  Administered 2020-04-14: 700 mg via INTRAVENOUS
  Filled 2020-04-14: qty 14

## 2020-04-14 MED ORDER — SODIUM CHLORIDE 0.9 % IV SOLN
2400.0000 mg/m2 | INTRAVENOUS | Status: DC
  Administered 2020-04-14: 4300 mg via INTRAVENOUS
  Filled 2020-04-14: qty 86

## 2020-04-14 MED ORDER — LEUCOVORIN CALCIUM INJECTION 350 MG
700.0000 mg | Freq: Once | INTRAVENOUS | Status: AC
  Administered 2020-04-14: 700 mg via INTRAVENOUS
  Filled 2020-04-14: qty 35

## 2020-04-14 MED ORDER — SODIUM CHLORIDE 0.9 % IV SOLN
4.4000 mg/kg | Freq: Once | INTRAVENOUS | Status: AC
  Administered 2020-04-14: 300 mg via INTRAVENOUS
  Filled 2020-04-14: qty 12

## 2020-04-14 NOTE — Progress Notes (Signed)
Pt doing ok. Here to get chemo and only issue is numbness and tingling of fingers and feet-new med added has not made a difference but he has only been on it for 2 weeks

## 2020-04-15 NOTE — Progress Notes (Signed)
Hematology/Oncology Consult note [facility]  Telephone:([phone] [phone]  Patient Care Team: Jodi Marble, MD as PCP - General (Internal Medicine) Clent Jacks, RN as Oncology Nurse Navigator   Name of the patient: Eric Simon  [id number]  11/13/1951   Date of visit: 04/15/20  Diagnosis- metastatic colon cancer with lymph node metastases K-ras/BRAF wild-type   Chief complaint/ Reason for visit-on treatment assessment prior to cycle 17 of 5-FU Mvasi chemotherapy  Heme/Onc history: Patient is a 69 yr old male with >40 pack year history of smoking. He currently smokes 0.5ppd. he presented to the ER with symptoms of left sided chest pain and left arm pain. Troponin was negative ekg was unremarkable. Ct chest showed no PE. He was incidentally noted to have mediastinal and retrocrural adenopathy and a 2.1X2.3X4.4 cm retroaortic soft tissue lesion in the posterior left chest. 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 Low-grade activity in the left lateral fifth and sixth ribs associated with nondisplaced fractures likely reflecting healing response problem malignancy.  Patient underwent CT-guided biopsy of the retroperitoneal lymph node pathology showed metastatic adenocarcinoma compatible with colorectal origin. CK7 negative. CK20 positive. CDX 2+. TTF-1 negative. PSA negative. This pattern of immunoreactivity supports the above diagnosis. Patient underwent colonoscopy which showed sigmoid mass that was consistent with adenocarcinoma. RASpanel testing showed that he was wild-type for both K-ras and BRAF   Interval history-patient currently reports doing well.  He does have mild tingling numbness in his extremities which is essentially stable.  Appetite is good and nausea is resolved.  He remains active and  has gained weight.  ECOG PS- 1 Pain scale- 0  Review of systems- Review of Systems  Constitutional: Negative for chills, fever, malaise/fatigue and weight loss.  HENT: Negative for congestion, ear discharge and nosebleeds.   Eyes: Negative for blurred vision.  Respiratory: Negative for cough, hemoptysis, sputum production, shortness of breath and wheezing.   Cardiovascular: Negative for chest pain, palpitations, orthopnea and claudication.  Gastrointestinal: Negative for abdominal pain, blood in stool, constipation, diarrhea, heartburn, melena, nausea and vomiting.  Genitourinary: Negative for dysuria, flank pain, frequency, hematuria and urgency.  Musculoskeletal: Negative for back pain, joint pain and myalgias.  Skin: Negative for rash.  Neurological: Negative for dizziness, tingling, focal weakness, seizures, weakness and headaches.  Endo/Heme/Allergies: Does not bruise/bleed easily.  Psychiatric/Behavioral: Negative for depression and suicidal ideas. The patient does not have insomnia.       No Known Allergies   Past Medical History:  Diagnosis Date  . Colon cancer ([place])   . COPD (chronic obstructive pulmonary disease) ([place])   . Hyperlipidemia      Past Surgical History:  Procedure Laterality Date  . COLONOSCOPY WITH PROPOFOL N/A 06/26/2019   Procedure: COLONOSCOPY WITH PROPOFOL;  Surgeon: [name], MD;  Location: [facility] ENDOSCOPY;  Service: Gastroenterology;  Laterality: N/A;  . PORTA CATH INSERTION N/A 06/25/2019   Procedure: PORTA CATH INSERTION;  Surgeon: Algernon Huxley, MD;  Location: [facility];  Service: Cardiovascular;  Laterality: N/A;    Social History   Socioeconomic History  . Marital status: Single    Spouse name: Not on file  . Number of children: Not on file  . Years of education: Not on file  . Highest education level: Not on file  Occupational History  . Not on file  Tobacco Use  . Smoking  status: Current Every Day Smoker    Packs/day:  0.50    Years: 40.00    Pack years: 20.00    Types: Cigarettes  . Smokeless tobacco: Never Used  . Tobacco comment: smoking 40 years  Substance and Sexual Activity  . Alcohol use: No  . Drug use: No  . Sexual activity: Not Currently  Other Topics Concern  . Not on file  Social History Narrative  . Not on file   Social Determinants of Health   Financial Resource Strain: Low Risk   . Difficulty of Paying Living Expenses: Not hard at all  Food Insecurity: No Food Insecurity  . Worried About Charity fundraiser in the Last Year: Never true  . Ran Out of Food in the Last Year: Never true  Transportation Needs: No Transportation Needs  . Lack of Transportation (Medical): No  . Lack of Transportation (Non-Medical): No  Physical Activity:   . Days of Exercise per Week:   . Minutes of Exercise per Session:   Stress: No Stress Concern Present  . Feeling of Stress : Not at all  Social Connections: Unknown  . Frequency of Communication with Friends and Family: More than three times a week  . Frequency of Social Gatherings with Friends and Family: Not on file  . Attends Religious Services: Not on file  . Active Member of Clubs or Organizations: Not on file  . Attends Archivist Meetings: Not on file  . Marital Status: Not on file  Intimate Partner Violence: Not At Risk  . Fear of Current or Ex-Partner: No  . Emotionally Abused: No  . Physically Abused: No  . Sexually Abused: No    Family History  Problem Relation Age of Onset  . Brain cancer Father      Current Outpatient Medications:  .  aspirin EC 81 MG tablet, Take 81 mg by mouth daily., Disp: , Rfl:  .  azelastine (ASTELIN) 0.1 % nasal spray, Place 1 spray into both nostrils 1 day or 1 dose., Disp: , Rfl:  .  cetirizine (ZYRTEC) 10 MG tablet, Take 1 tablet (10 mg total) by mouth daily. (Patient taking differently: Take 10 mg by mouth daily as needed. ), Disp: 30 tablet, Rfl: 0 .  chlorthalidone (HYGROTON) 25 MG  tablet, Take 1 tablet by mouth daily., Disp: , Rfl:  .  dexamethasone (DECADRON) 4 MG tablet, Take 2 tablets (8 mg total) by mouth daily. Start the day after chemotherapy for 2 days. Take with food., Disp: 30 tablet, Rfl: 1 .  DEXILANT 60 MG capsule, Take 1 capsule by mouth 1 day or 1 dose., Disp: , Rfl:  .  DULoxetine (CYMBALTA) 30 MG capsule, Take 1 capsule (30 mg total) by mouth 2 (two) times daily., Disp: 60 capsule, Rfl: 1 .  fluticasone (FLONASE) 50 MCG/ACT nasal spray, Place 2 sprays into both nostrils 1 day or 1 dose., Disp: , Rfl:  .  fluticasone (VERAMYST) 27.5 MCG/SPRAY nasal spray, Place 2 sprays into the nose daily., Disp: , Rfl:  .  lidocaine-prilocaine (EMLA) cream, Apply to affected area once, Disp: 30 g, Rfl: 3 .  montelukast (SINGULAIR) 10 MG tablet, Take 10 mg by mouth at bedtime., Disp: , Rfl:  .  pantoprazole (PROTONIX) 40 MG tablet, Take 40 mg by mouth daily., Disp: , Rfl:  .  pregabalin (LYRICA) 75 MG capsule, Take 1 capsule (75 mg total) by mouth 2 (two) times daily., Disp: 60 capsule, Rfl: 2 .  simvastatin (  ZOCOR) 20 MG tablet, Take 20 mg by mouth daily., Disp: , Rfl:  .  SYMBICORT 80-4.5 MCG/ACT inhaler, Inhale 2 puffs into the lungs 1 day or 1 dose., Disp: , Rfl:  .  tamsulosin (FLOMAX) 0.4 MG CAPS capsule, Take 0.4 mg by mouth., Disp: , Rfl:  .  magic mouthwash w/lidocaine SOLN, Take 5-10 mLs by mouth 4 (four) times daily as needed for mouth pain. (Patient not taking: Reported on 04/14/2020), Disp: 480 mL, Rfl: 3 .  nicotine (NICODERM CQ - DOSED IN MG/24 HOURS) 21 mg/24hr patch, Place 1 patch onto the skin 1 day or 1 dose., Disp: , Rfl:  .  ondansetron (ZOFRAN) 8 MG tablet, Take 1 tablet (8 mg total) by mouth 2 (two) times daily as needed for refractory nausea / vomiting. Start on day 3 after chemotherapy. (Patient not taking: Reported on 04/14/2020), Disp: 30 tablet, Rfl: 1 .  potassium chloride SA (K-DUR) 20 MEQ tablet, Take 1 tablet (20 mEq total) by mouth daily. (Patient  not taking: Reported on 04/14/2020), Disp: 14 tablet, Rfl: 0 .  prochlorperazine (COMPAZINE) 10 MG tablet, Take 1 tablet (10 mg total) by mouth every 6 (six) hours as needed (Nausea or vomiting). (Patient not taking: Reported on 04/14/2020), Disp: 30 tablet, Rfl: 1 .  sucralfate (CARAFATE) 1 g tablet, Take 1 g by mouth 4 (four) times daily., Disp: , Rfl:  No current facility-administered medications for this visit.  Facility-Administered Medications Ordered in Other Visits:  .  sodium chloride flush (NS) 0.9 % injection 10 mL, 10 mL, Intracatheter, PRN, Sindy Guadeloupe, MD, 10 mL at 08/15/19 1405  Physical exam:  Vitals:   04/14/20 0838  BP: 112/76  Pulse: 79  Resp: 16  Temp: (!) 97.1 F (36.2 C)  TempSrc: Tympanic  Weight: 142 lb 6.4 oz (64.6 kg)   Physical Exam Cardiovascular:     Rate and Rhythm: Normal rate and regular rhythm.     Heart sounds: Normal heart sounds.  Pulmonary:     Effort: Pulmonary effort is normal.     Breath sounds: Normal breath sounds.  Abdominal:     General: Bowel sounds are normal.     Palpations: Abdomen is soft.  Skin:    General: Skin is warm and dry.  Neurological:     Mental Status: He is alert and oriented to person, place, and time.      CMP Latest Ref Rng & Units 04/14/2020  Glucose 70 - 99 mg/dL 143(H)  BUN 8 - 23 mg/dL 33(H)  Creatinine 0.61 - 1.24 mg/dL 0.72  Sodium 135 - 145 mmol/L 137  Potassium 3.5 - 5.1 mmol/L 3.9  Chloride 98 - 111 mmol/L 104  CO2 22 - 32 mmol/L 24  Calcium 8.9 - 10.3 mg/dL 9.0  Total Protein 6.5 - 8.1 g/dL 7.4  Total Bilirubin 0.3 - 1.2 mg/dL 0.6  Alkaline Phos 38 - 126 U/L 144(H)  AST 15 - 41 U/L 54(H)  ALT 0 - 44 U/L 50(H)   CBC Latest Ref Rng & Units 04/14/2020  WBC 4.0 - 10.5 K/uL 9.9  Hemoglobin 13.0 - 17.0 g/dL 14.4  Hematocrit 39.0 - 52.0 % 43.2  Platelets 150 - 400 K/uL 171    Assessment and plan- Patient is a 69 y.o. male with metastatic colon cancer TX NX M1 with generalized lymphadenopathy  K-ras/beta wild type.  He is here for on treatment assessment prior to cycle 17 of 5-FU Mvasi chemotherapy  Counts okay to proceed with cycle  17 of 5-FU Mvasi chemotherapy today.  He will receive Udenyca on day 3 of pump disconnect.  We have been doing chemotherapy every 3 weeks for better tolerance.   Abnormal LFTs: Likely secondary to 5-FU patient's AST ALT remain stable but elevated in the 50s.  Alkaline phosphatase fluctuating between 130s to 140s.  Total bilirubin normal.  Continue to monitor  Chemo-induced peripheral neuropathy: Stable grade 1.  Continue Cymbalta  I will plan to get repeat scans before next cycle.  I will back in 3 weeks with CBC with differential and CMP   Visit Diagnosis 1. 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 ([place])   2. Encounter for antineoplastic chemotherapy   3. Abnormal LFTs      Dr. Randa Evens, MD, MPH [facility] at [facility] [phone] 04/15/2020 6:22 PM

## 2020-04-16 ENCOUNTER — Other Ambulatory Visit: Payer: Self-pay

## 2020-04-16 ENCOUNTER — Inpatient Hospital Stay: Payer: Medicare HMO

## 2020-04-16 VITALS — BP 104/67 | HR 80 | Resp 18

## 2020-04-16 DIAGNOSIS — C189 Malignant neoplasm of colon, unspecified: Secondary | ICD-10-CM

## 2020-04-16 DIAGNOSIS — C772 Secondary and unspecified malignant neoplasm of intra-abdominal lymph nodes: Secondary | ICD-10-CM

## 2020-04-16 DIAGNOSIS — Z5112 Encounter for antineoplastic immunotherapy: Secondary | ICD-10-CM | POA: Diagnosis not present

## 2020-04-16 MED ORDER — PEGFILGRASTIM-CBQV 6 MG/0.6ML ~~LOC~~ SOSY
6.0000 mg | PREFILLED_SYRINGE | Freq: Once | SUBCUTANEOUS | Status: AC
  Administered 2020-04-16: 13:00:00 6 mg via SUBCUTANEOUS
  Filled 2020-04-16: qty 0.6

## 2020-04-16 MED ORDER — HEPARIN SOD (PORK) LOCK FLUSH 100 UNIT/ML IV SOLN
500.0000 [IU] | Freq: Once | INTRAVENOUS | Status: AC | PRN
  Administered 2020-04-16: 500 [IU]
  Filled 2020-04-16: qty 5

## 2020-04-16 MED ORDER — HEPARIN SOD (PORK) LOCK FLUSH 100 UNIT/ML IV SOLN
INTRAVENOUS | Status: AC
  Filled 2020-04-16: qty 5

## 2020-04-16 MED ORDER — SODIUM CHLORIDE 0.9% FLUSH
10.0000 mL | INTRAVENOUS | Status: DC | PRN
  Administered 2020-04-16: 10 mL
  Filled 2020-04-16: qty 10

## 2020-04-22 ENCOUNTER — Inpatient Hospital Stay (HOSPITAL_BASED_OUTPATIENT_CLINIC_OR_DEPARTMENT_OTHER): Payer: Medicare HMO | Admitting: Hospice and Palliative Medicine

## 2020-04-22 DIAGNOSIS — Z515 Encounter for palliative care: Secondary | ICD-10-CM

## 2020-04-22 NOTE — Progress Notes (Signed)
Was unable to reach patient by phone for scheduled visit.  Voicemail left.  Will reschedule.

## 2020-04-28 ENCOUNTER — Other Ambulatory Visit: Payer: Self-pay

## 2020-04-28 ENCOUNTER — Ambulatory Visit
Admission: RE | Admit: 2020-04-28 | Discharge: 2020-04-28 | Disposition: A | Discharge status: Home / Self Care | Payer: Medicare HMO | Source: Ambulatory Visit | Attending: Oncology | Admitting: Oncology

## 2020-04-28 DIAGNOSIS — C772 Secondary and unspecified malignant neoplasm of intra-abdominal lymph nodes: Secondary | ICD-10-CM | POA: Diagnosis present

## 2020-04-28 DIAGNOSIS — C189 Malignant neoplasm of colon, unspecified: Secondary | ICD-10-CM | POA: Diagnosis present

## 2020-04-28 MED ORDER — IOHEXOL 300 MG/ML  SOLN
100.0000 mL | Freq: Once | INTRAMUSCULAR | Status: AC | PRN
  Administered 2020-04-28: 100 mL via INTRAVENOUS

## 2020-04-28 NOTE — Progress Notes (Signed)

## 2020-05-05 ENCOUNTER — Inpatient Hospital Stay: Payer: Medicare HMO

## 2020-05-05 ENCOUNTER — Inpatient Hospital Stay (HOSPITAL_BASED_OUTPATIENT_CLINIC_OR_DEPARTMENT_OTHER): Payer: Medicare HMO | Admitting: Oncology

## 2020-05-05 ENCOUNTER — Other Ambulatory Visit: Payer: Self-pay

## 2020-05-05 ENCOUNTER — Encounter: Payer: Self-pay | Admitting: Oncology

## 2020-05-05 ENCOUNTER — Other Ambulatory Visit: Payer: Self-pay | Admitting: *Deleted

## 2020-05-05 VITALS — BP 121/72 | HR 80 | Temp 97.9°F | Resp 16 | Wt 144.1 lb

## 2020-05-05 DIAGNOSIS — Z5112 Encounter for antineoplastic immunotherapy: Secondary | ICD-10-CM | POA: Diagnosis not present

## 2020-05-05 DIAGNOSIS — C189 Malignant neoplasm of colon, unspecified: Secondary | ICD-10-CM | POA: Diagnosis not present

## 2020-05-05 DIAGNOSIS — C772 Secondary and unspecified malignant neoplasm of intra-abdominal lymph nodes: Secondary | ICD-10-CM

## 2020-05-05 DIAGNOSIS — Z95828 Presence of other vascular implants and grafts: Secondary | ICD-10-CM

## 2020-05-05 DIAGNOSIS — G620 Drug-induced polyneuropathy: Secondary | ICD-10-CM

## 2020-05-05 DIAGNOSIS — Z5111 Encounter for antineoplastic chemotherapy: Secondary | ICD-10-CM

## 2020-05-05 DIAGNOSIS — T451X5A Adverse effect of antineoplastic and immunosuppressive drugs, initial encounter: Secondary | ICD-10-CM

## 2020-05-05 LAB — CBC WITH DIFFERENTIAL/PLATELET
Abs Immature Granulocytes: 0.07 10*3/uL (ref 0.00–0.07)
Basophils Absolute: 0.1 10*3/uL (ref 0.0–0.1)
Basophils Relative: 1 %
Eosinophils Absolute: 0.1 10*3/uL (ref 0.0–0.5)
Eosinophils Relative: 1 %
HCT: 42.6 % (ref 39.0–52.0)
Hemoglobin: 14.7 g/dL (ref 13.0–17.0)
Immature Granulocytes: 1 %
Lymphocytes Relative: 23 %
Lymphs Abs: 2.5 10*3/uL (ref 0.7–4.0)
MCH: 32.7 pg (ref 26.0–34.0)
MCHC: 34.5 g/dL (ref 30.0–36.0)
MCV: 94.9 fL (ref 80.0–100.0)
Monocytes Absolute: 0.9 10*3/uL (ref 0.1–1.0)
Monocytes Relative: 8 %
Neutro Abs: 7.1 10*3/uL (ref 1.7–7.7)
Neutrophils Relative %: 66 %
Platelets: 166 10*3/uL (ref 150–400)
RBC: 4.49 MIL/uL (ref 4.22–5.81)
RDW: 15.3 % (ref 11.5–15.5)
WBC: 10.7 10*3/uL — ABNORMAL HIGH (ref 4.0–10.5)
nRBC: 0 % (ref 0.0–0.2)

## 2020-05-05 LAB — COMPREHENSIVE METABOLIC PANEL
ALT: 41 U/L (ref 0–44)
AST: 44 U/L — ABNORMAL HIGH (ref 15–41)
Albumin: 3.6 g/dL (ref 3.5–5.0)
Alkaline Phosphatase: 123 U/L (ref 38–126)
Anion gap: 10 (ref 5–15)
BUN: 23 mg/dL (ref 8–23)
CO2: 24 mmol/L (ref 22–32)
Calcium: 9 mg/dL (ref 8.9–10.3)
Chloride: 101 mmol/L (ref 98–111)
Creatinine, Ser: 0.84 mg/dL (ref 0.61–1.24)
GFR calc Af Amer: >60 mL/min (ref >60)
GFR calc non Af Amer: >60 mL/min (ref >60)
Glucose, Bld: 142 mg/dL — ABNORMAL HIGH (ref 70–99)
Potassium: 3.5 mmol/L (ref 3.5–5.1)
Sodium: 135 mmol/L (ref 135–145)
Total Bilirubin: 0.8 mg/dL (ref 0.3–1.2)
Total Protein: 7.4 g/dL (ref 6.5–8.1)

## 2020-05-05 LAB — PROTEIN, URINE, RANDOM: Total Protein, Urine: <6 mg/dL

## 2020-05-05 MED ORDER — DEXAMETHASONE SODIUM PHOSPHATE 10 MG/ML IJ SOLN
10.0000 mg | Freq: Once | INTRAMUSCULAR | Status: AC
  Administered 2020-05-05: 10 mg via INTRAVENOUS
  Filled 2020-05-05: qty 1

## 2020-05-05 MED ORDER — SODIUM CHLORIDE 0.9% FLUSH
10.0000 mL | INTRAVENOUS | Status: DC | PRN
  Administered 2020-05-05: 10 mL via INTRAVENOUS
  Filled 2020-05-05: qty 10

## 2020-05-05 MED ORDER — HEPARIN SOD (PORK) LOCK FLUSH 100 UNIT/ML IV SOLN
INTRAVENOUS | Status: AC
  Filled 2020-05-05: qty 5

## 2020-05-05 MED ORDER — DULOXETINE HCL 30 MG PO CPEP: 30.0000 mg | ORAL_CAPSULE | Freq: Two times a day (BID) | ORAL | 1 refills | Status: DC

## 2020-05-05 MED ORDER — SODIUM CHLORIDE 0.9 % IV SOLN
INTRAVENOUS | Status: DC
  Filled 2020-05-05: qty 250

## 2020-05-05 MED ORDER — FLUOROURACIL CHEMO INJECTION 2.5 GM/50ML
400.0000 mg/m2 | Freq: Once | INTRAVENOUS | Status: AC
  Administered 2020-05-05: 700 mg via INTRAVENOUS
  Filled 2020-05-05: qty 14

## 2020-05-05 MED ORDER — PALONOSETRON HCL INJECTION 0.25 MG/5ML
0.2500 mg | Freq: Once | INTRAVENOUS | Status: AC
  Administered 2020-05-05: 0.25 mg via INTRAVENOUS
  Filled 2020-05-05: qty 5

## 2020-05-05 MED ORDER — LEUCOVORIN CALCIUM INJECTION 350 MG
700.0000 mg | Freq: Once | INTRAVENOUS | Status: AC
  Administered 2020-05-05: 700 mg via INTRAVENOUS
  Filled 2020-05-05: qty 25

## 2020-05-05 MED ORDER — SODIUM CHLORIDE 0.9 % IV SOLN
2400.0000 mg/m2 | INTRAVENOUS | Status: DC
  Administered 2020-05-05: 4300 mg via INTRAVENOUS
  Filled 2020-05-05: qty 86

## 2020-05-05 MED ORDER — SODIUM CHLORIDE 0.9 % IV SOLN
4.4000 mg/kg | Freq: Once | INTRAVENOUS | Status: AC
  Administered 2020-05-05: 300 mg via INTRAVENOUS
  Filled 2020-05-05: qty 12

## 2020-05-05 NOTE — Progress Notes (Signed)
Patient here for oncology follow-up appointment, expresses no complaints or concerns at this time.    

## 2020-05-07 ENCOUNTER — Inpatient Hospital Stay: Payer: Medicare HMO

## 2020-05-07 ENCOUNTER — Other Ambulatory Visit: Payer: Self-pay

## 2020-05-07 VITALS — BP 122/76 | HR 86 | Temp 97.8°F | Resp 17

## 2020-05-07 DIAGNOSIS — Z5112 Encounter for antineoplastic immunotherapy: Secondary | ICD-10-CM | POA: Diagnosis not present

## 2020-05-07 DIAGNOSIS — C772 Secondary and unspecified malignant neoplasm of intra-abdominal lymph nodes: Secondary | ICD-10-CM

## 2020-05-07 DIAGNOSIS — C189 Malignant neoplasm of colon, unspecified: Secondary | ICD-10-CM

## 2020-05-07 MED ORDER — HEPARIN SOD (PORK) LOCK FLUSH 100 UNIT/ML IV SOLN
500.0000 [IU] | Freq: Once | INTRAVENOUS | Status: AC | PRN
  Administered 2020-05-07: 500 [IU]
  Filled 2020-05-07: qty 5

## 2020-05-07 MED ORDER — SODIUM CHLORIDE 0.9% FLUSH
10.0000 mL | INTRAVENOUS | Status: DC | PRN
  Administered 2020-05-07: 10 mL
  Filled 2020-05-07: qty 10

## 2020-05-07 MED ORDER — PEGFILGRASTIM-CBQV 6 MG/0.6ML ~~LOC~~ SOSY
6.0000 mg | PREFILLED_SYRINGE | Freq: Once | SUBCUTANEOUS | Status: AC
  Administered 2020-05-07: 6 mg via SUBCUTANEOUS
  Filled 2020-05-07: qty 0.6

## 2020-05-08 NOTE — Progress Notes (Signed)
Hematology/Oncology Consult note [facility]  Telephone:([phone] [phone]  Patient Care Team: Jodi Marble, MD as PCP - General (Internal Medicine) Clent Jacks, RN as Oncology Nurse Navigator   Name of the patient: Eric Simon  [id number]  [birth year]   Date of visit: 05/08/20  Diagnosis- metastatic colon cancer with lymph node metastases K-ras/BRAF wild-type  Chief complaint/ Reason for visit-on treatment assessment prior to cycle 18 of 5-FU Mvasi chemotherapy  Heme/Onc history: Patient is a 69 yr old male with >40 pack year history of smoking. He currently smokes 0.5ppd. he presented to the ER with symptoms of left sided chest pain and left arm pain. Troponin was negative ekg was unremarkable. Ct chest showed no PE. He was incidentally noted to have mediastinal and retrocrural adenopathy and a 2.1X2.3X4.4 cm retroaortic soft tissue lesion in the posterior left chest. 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 Low-grade activity in the left lateral fifth and sixth ribs associated with nondisplaced fractures likely reflecting healing response problem malignancy.  Patient underwent CT-guided biopsy of the retroperitoneal lymph node pathology showed metastatic adenocarcinoma compatible with colorectal origin. CK7 negative. CK20 positive. CDX 2+. TTF-1 negative. PSA negative. This pattern of immunoreactivity supports the above diagnosis. Patient underwent colonoscopy which showed sigmoid mass that was consistent with adenocarcinoma. RASpanel testing showed that he was wild-type for both K-ras and BRAF  Interval history-patient reports he is doing well.  Appetite and weight have remained stable.  Denies any significant nausea.  Still has tingling numbness in his hands and feet which is essentially stable with  Cymbalta.  ECOG PS- 1 Pain scale- 0   Review of systems- Review of Systems  Constitutional: Negative for chills, fever, malaise/fatigue and weight loss.  HENT: Negative for congestion, ear discharge and nosebleeds.   Eyes: Negative for blurred vision.  Respiratory: Negative for cough, hemoptysis, sputum production, shortness of breath and wheezing.   Cardiovascular: Negative for chest pain, palpitations, orthopnea and claudication.  Gastrointestinal: Negative for abdominal pain, blood in stool, constipation, diarrhea, heartburn, melena, nausea and vomiting.  Genitourinary: Negative for dysuria, flank pain, frequency, hematuria and urgency.  Musculoskeletal: Negative for back pain, joint pain and myalgias.  Skin: Negative for rash.  Neurological: Positive for sensory change (Peripheral neuropathy). Negative for dizziness, tingling, focal weakness, seizures, weakness and headaches.  Endo/Heme/Allergies: Does not bruise/bleed easily.  Psychiatric/Behavioral: Negative for depression and suicidal ideas. The patient does not have insomnia.      No Known Allergies   Past Medical History:  Diagnosis Date   Colon cancer ([place])    COPD (chronic obstructive pulmonary disease) ([place])    Hyperlipidemia      Past Surgical History:  Procedure Laterality Date   COLONOSCOPY WITH PROPOFOL N/A 06/26/2019   Procedure: COLONOSCOPY WITH PROPOFOL;  Surgeon: [name], MD;  Location: [facility] ENDOSCOPY;  Service: Gastroenterology;  Laterality: N/A;   PORTA CATH INSERTION N/A 06/25/2019   Procedure: PORTA CATH INSERTION;  Surgeon: Algernon Huxley, MD;  Location: [facility];  Service: Cardiovascular;  Laterality: N/A;    Social History   Socioeconomic History   Marital status: Single    Spouse name: Not on file   Number of children: Not on file   Years of education: Not on file   Highest education level: Not on file  Occupational History   Not on file  Tobacco  Use   Smoking  status: Current Every Day Smoker    Packs/day: 0.50    Years: 40.00    Pack years: 20.00    Types: Cigarettes   Smokeless tobacco: Never Used   Tobacco comment: smoking 40 years  Substance and Sexual Activity   Alcohol use: No   Drug use: No   Sexual activity: Not Currently  Other Topics Concern   Not on file  Social History Narrative   Not on file   Social Determinants of Health   Financial Resource Strain: Low Risk    Difficulty of Paying Living Expenses: Not hard at all  Food Insecurity: No Food Insecurity   Worried About Charity fundraiser in the Last Year: Never true   Ran Out of Food in the Last Year: Never true  Transportation Needs: No Transportation Needs   Lack of Transportation (Medical): No   Lack of Transportation (Non-Medical): No  Physical Activity:    Days of Exercise per Week:    Minutes of Exercise per Session:   Stress: No Stress Concern Present   Feeling of Stress : Not at all  Social Connections: Unknown   Frequency of Communication with Friends and Family: More than three times a week   Frequency of Social Gatherings with Friends and Family: Not on file   Attends Religious Services: Not on Electrical engineer or Organizations: Not on file   Attends Archivist Meetings: Not on file   Marital Status: Not on file  Intimate Partner Violence: Not At Risk   Fear of Current or Ex-Partner: No   Emotionally Abused: No   Physically Abused: No   Sexually Abused: No    Family History  Problem Relation Age of Onset   Brain cancer Father      Current Outpatient Medications:    aspirin EC 81 MG tablet, Take 81 mg by mouth daily., Disp: , Rfl:    azelastine (ASTELIN) 0.1 % nasal spray, Place 1 spray into both nostrils 1 day or 1 dose., Disp: , Rfl:    cetirizine (ZYRTEC) 10 MG tablet, Take 1 tablet (10 mg total) by mouth daily. (Patient taking differently: Take 10 mg by mouth daily as needed. ), Disp: 30  tablet, Rfl: 0   chlorthalidone (HYGROTON) 25 MG tablet, Take 1 tablet by mouth daily., Disp: , Rfl:    dexamethasone (DECADRON) 4 MG tablet, Take 2 tablets (8 mg total) by mouth daily. Start the day after chemotherapy for 2 days. Take with food., Disp: 30 tablet, Rfl: 1   DEXILANT 60 MG capsule, Take 1 capsule by mouth 1 day or 1 dose., Disp: , Rfl:    fluticasone (FLONASE) 50 MCG/ACT nasal spray, Place 2 sprays into both nostrils 1 day or 1 dose., Disp: , Rfl:    fluticasone (VERAMYST) 27.5 MCG/SPRAY nasal spray, Place 2 sprays into the nose daily., Disp: , Rfl:    lidocaine-prilocaine (EMLA) cream, Apply to affected area once, Disp: 30 g, Rfl: 3   montelukast (SINGULAIR) 10 MG tablet, Take 10 mg by mouth at bedtime., Disp: , Rfl:    nicotine (NICODERM CQ - DOSED IN MG/24 HOURS) 21 mg/24hr patch, Place 1 patch onto the skin 1 day or 1 dose., Disp: , Rfl:    pantoprazole (PROTONIX) 40 MG tablet, Take 40 mg by mouth daily., Disp: , Rfl:    simvastatin (ZOCOR) 20 MG tablet, Take 20 mg by mouth daily., Disp: , Rfl:  sucralfate (CARAFATE) 1 g tablet, Take 1 g by mouth 4 (four) times daily., Disp: , Rfl:    SYMBICORT 80-4.5 MCG/ACT inhaler, Inhale 2 puffs into the lungs 1 day or 1 dose., Disp: , Rfl:    tamsulosin (FLOMAX) 0.4 MG CAPS capsule, Take 0.4 mg by mouth., Disp: , Rfl:    DULoxetine (CYMBALTA) 30 MG capsule, Take 1 capsule (30 mg total) by mouth 2 (two) times daily., Disp: 60 capsule, Rfl: 1   magic mouthwash w/lidocaine SOLN, Take 5-10 mLs by mouth 4 (four) times daily as needed for mouth pain. (Patient not taking: Reported on 04/14/2020), Disp: 480 mL, Rfl: 3   ondansetron (ZOFRAN) 8 MG tablet, Take 1 tablet (8 mg total) by mouth 2 (two) times daily as needed for refractory nausea / vomiting. Start on day 3 after chemotherapy. (Patient not taking: Reported on 04/14/2020), Disp: 30 tablet, Rfl: 1   potassium chloride SA (K-DUR) 20 MEQ tablet, Take 1 tablet (20 mEq total) by  mouth daily. (Patient not taking: Reported on 04/14/2020), Disp: 14 tablet, Rfl: 0   pregabalin (LYRICA) 75 MG capsule, Take 1 capsule (75 mg total) by mouth 2 (two) times daily. (Patient not taking: Reported on 05/05/2020), Disp: 60 capsule, Rfl: 2   prochlorperazine (COMPAZINE) 10 MG tablet, Take 1 tablet (10 mg total) by mouth every 6 (six) hours as needed (Nausea or vomiting). (Patient not taking: Reported on 04/14/2020), Disp: 30 tablet, Rfl: 1 No current facility-administered medications for this visit.  Facility-Administered Medications Ordered in Other Visits:    sodium chloride flush (NS) 0.9 % injection 10 mL, 10 mL, Intracatheter, PRN, Sindy Guadeloupe, MD, 10 mL at 08/15/19 1405  Physical exam:  Vitals:   05/05/20 0859  BP: 121/72  Pulse: 80  Resp: 16  Temp: 97.9 F (36.6 C)  TempSrc: Oral  SpO2: 100%  Weight: 144 lb 1.6 oz (65.4 kg)   Physical Exam Cardiovascular:     Rate and Rhythm: Normal rate and regular rhythm.     Heart sounds: Normal heart sounds.  Pulmonary:     Effort: Pulmonary effort is normal.     Breath sounds: Normal breath sounds.  Abdominal:     General: Bowel sounds are normal.     Palpations: Abdomen is soft.  Skin:    General: Skin is warm and dry.  Neurological:     Mental Status: He is alert and oriented to person, place, and time.      CMP Latest Ref Rng & Units 05/05/2020  Glucose 70 - 99 mg/dL 142(H)  BUN 8 - 23 mg/dL 23  Creatinine 0.61 - 1.24 mg/dL 0.84  Sodium 135 - 145 mmol/L 135  Potassium 3.5 - 5.1 mmol/L 3.5  Chloride 98 - 111 mmol/L 101  CO2 22 - 32 mmol/L 24  Calcium 8.9 - 10.3 mg/dL 9.0  Total Protein 6.5 - 8.1 g/dL 7.4  Total Bilirubin 0.3 - 1.2 mg/dL 0.8  Alkaline Phos 38 - 126 U/L 123  AST 15 - 41 U/L 44(H)  ALT 0 - 44 U/L 41   CBC Latest Ref Rng & Units 05/05/2020  WBC 4.0 - 10.5 K/uL 10.7(H)  Hemoglobin 13.0 - 17.0 g/dL 14.7  Hematocrit 39.0 - 52.0 % 42.6  Platelets 150 - 400 K/uL 166    No images are attached to  the encounter.  CT Abdomen Pelvis W Contrast  Result Date: 04/28/2020 CLINICAL DATA:  Colon cancer treated with chemotherapy.  Restaging. EXAM: CT ABDOMEN AND PELVIS WITH  CONTRAST TECHNIQUE: Multidetector CT imaging of the abdomen and pelvis was performed using the standard protocol following bolus administration of intravenous contrast. CONTRAST:  162m OMNIPAQUE IOHEXOL 300 MG/ML  SOLN COMPARISON:  01/14/2020. FINDINGS: Lower chest: Dependent atelectasis in the lungs bilaterally. Heart size normal. Atherosclerotic calcification of the aorta and coronary arteries. No pericardial or pleural effusion. Hepatobiliary: Liver is slightly decreased in attenuation diffusely. Liver and gallbladder are otherwise unremarkable. No biliary ductal dilatation. Pancreas: Negative. Spleen: Measures 13.3 cm, similar. Adrenals/Urinary Tract: Adrenal glands are unremarkable. Subcentimeter low-attenuation lesions in the kidneys are too small to definitively characterize but statistically, cysts are likely. Ureters are decompressed. Bladder is grossly unremarkable. Stomach/Bowel: Small hiatal hernia. Stomach, small bowel, appendix and colon are otherwise unremarkable. Vascular/Lymphatic: Atherosclerotic calcification of the aorta without aneurysm. Left iliac chain lymph nodes have increased in size in the interval. Index left common iliac lymph node measures 12 mm (2/53), previously 5 mm. Index left external iliac lymph node measures 11 mm (2/73), previously 8 mm. No additional pathologically enlarged lymph nodes in the abdomen or pelvis. Reproductive: Prostate is slightly enlarged. Other: No free fluid. Mesenteries and peritoneum are unremarkable. Small umbilical hernia contains fat. Musculoskeletal: Degenerative changes in the spine. No worrisome lytic or sclerotic lesions. IMPRESSION: 1. Enlarging left iliac chain adenopathy, indicative of metastatic disease. 2. Hepatic steatosis. 3. Mild splenomegaly, similar. 4. Mild prostate  enlargement. 5. Aortic atherosclerosis (ICD10-I70.0). Coronary artery calcification. Electronically Signed   By: MLorin PicketM.D.   On: 04/28/2020 10:48     Assessment and plan- Patient is a 69y.o. male with metastatic colon cancer TX NX M1 with generalized lymphadenopathy K-ras/beta wild type.    He is here for on treatment assessment prior to cycle 18 of 5-FU Mvasi chemotherapy and discuss CT scan results  I have reviewed CT abdomen pelvis images independently and discussed findings with the patient.  Patient mainly has evidence of metastatic colon cancer in his intra-abdominal lymph nodes.These lymph nodes appear mildly larger at this time as compared to prior measurements.  Index left common iliac lymph node that was previously 5 mm is now 12 mm.  Prior 8 mm lymph node is now 11 mm.  However there is no new findings of metastatic disease.  I will therefore keep the present regimen going with 5-FU Mvasi chemotherapy and consider repeat scans in 3 months time.  If there is continued progression of disease I will add irinotecan to his regimen at that time  Counts are otherwise okay to proceed with cycle 18 of 5-FU Mvasi chemotherapy today.  Urine protein remains trace blood pressure is stable.  He did have previous 3 abnormal LFTs which are currently improved.  Continue to monitor I will see him back in 3 weeks time with CBC with differential CMP and urine protein for cycle 19.  We have been doing chemotherapy every 3 weeks for better tolerance.  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 Encounter for antineoplastic chemotherapy   2. 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 ([place]   3. Chemotherapy-induced neuropathy ([place]      Dr. ARanda Evens MD, MPH [id number]/27/2021 8:21 AM

## 2020-05-19 NOTE — Progress Notes (Signed)

## 2020-05-26 ENCOUNTER — Inpatient Hospital Stay: Payer: Medicare HMO | Attending: Oncology

## 2020-05-26 ENCOUNTER — Inpatient Hospital Stay: Payer: Medicare HMO

## 2020-05-26 ENCOUNTER — Other Ambulatory Visit: Payer: Self-pay

## 2020-05-26 ENCOUNTER — Encounter: Payer: Self-pay | Admitting: Oncology

## 2020-05-26 ENCOUNTER — Inpatient Hospital Stay (HOSPITAL_BASED_OUTPATIENT_CLINIC_OR_DEPARTMENT_OTHER): Payer: Medicare HMO | Admitting: Oncology

## 2020-05-26 VITALS — BP 115/77 | HR 90 | Temp 97.5°F | Resp 18 | Wt 145.8 lb

## 2020-05-26 DIAGNOSIS — C786 Secondary malignant neoplasm of retroperitoneum and peritoneum: Secondary | ICD-10-CM | POA: Diagnosis not present

## 2020-05-26 DIAGNOSIS — C772 Secondary and unspecified malignant neoplasm of intra-abdominal lymph nodes: Secondary | ICD-10-CM

## 2020-05-26 DIAGNOSIS — Z5111 Encounter for antineoplastic chemotherapy: Secondary | ICD-10-CM

## 2020-05-26 DIAGNOSIS — G620 Drug-induced polyneuropathy: Secondary | ICD-10-CM

## 2020-05-26 DIAGNOSIS — C189 Malignant neoplasm of colon, unspecified: Secondary | ICD-10-CM | POA: Diagnosis not present

## 2020-05-26 DIAGNOSIS — Z452 Encounter for adjustment and management of vascular access device: Secondary | ICD-10-CM | POA: Diagnosis not present

## 2020-05-26 DIAGNOSIS — Z5112 Encounter for antineoplastic immunotherapy: Secondary | ICD-10-CM | POA: Insufficient documentation

## 2020-05-26 DIAGNOSIS — Z5189 Encounter for other specified aftercare: Secondary | ICD-10-CM | POA: Diagnosis not present

## 2020-05-26 DIAGNOSIS — C187 Malignant neoplasm of sigmoid colon: Secondary | ICD-10-CM | POA: Diagnosis present

## 2020-05-26 DIAGNOSIS — T451X5A Adverse effect of antineoplastic and immunosuppressive drugs, initial encounter: Secondary | ICD-10-CM

## 2020-05-26 LAB — COMPREHENSIVE METABOLIC PANEL
ALT: 42 U/L (ref 0–44)
AST: 41 U/L (ref 15–41)
Albumin: 3.5 g/dL (ref 3.5–5.0)
Alkaline Phosphatase: 151 U/L — ABNORMAL HIGH (ref 38–126)
Anion gap: 9 (ref 5–15)
BUN: 29 mg/dL — ABNORMAL HIGH (ref 8–23)
CO2: 24 mmol/L (ref 22–32)
Calcium: 9.1 mg/dL (ref 8.9–10.3)
Chloride: 105 mmol/L (ref 98–111)
Creatinine, Ser: 0.8 mg/dL (ref 0.61–1.24)
GFR calc Af Amer: >60 mL/min (ref >60)
GFR calc non Af Amer: >60 mL/min (ref >60)
Glucose, Bld: 96 mg/dL (ref 70–99)
Potassium: 4.3 mmol/L (ref 3.5–5.1)
Sodium: 138 mmol/L (ref 135–145)
Total Bilirubin: 0.6 mg/dL (ref 0.3–1.2)
Total Protein: 7.4 g/dL (ref 6.5–8.1)

## 2020-05-26 LAB — CBC WITH DIFFERENTIAL/PLATELET
Abs Immature Granulocytes: 0.09 10*3/uL — ABNORMAL HIGH (ref 0.00–0.07)
Basophils Absolute: 0.1 10*3/uL (ref 0.0–0.1)
Basophils Relative: 1 %
Eosinophils Absolute: 0.2 10*3/uL (ref 0.0–0.5)
Eosinophils Relative: 2 %
HCT: 43.4 % (ref 39.0–52.0)
Hemoglobin: 14.6 g/dL (ref 13.0–17.0)
Immature Granulocytes: 1 %
Lymphocytes Relative: 19 %
Lymphs Abs: 2.3 10*3/uL (ref 0.7–4.0)
MCH: 31.9 pg (ref 26.0–34.0)
MCHC: 33.6 g/dL (ref 30.0–36.0)
MCV: 94.8 fL (ref 80.0–100.0)
Monocytes Absolute: 1.2 10*3/uL — ABNORMAL HIGH (ref 0.1–1.0)
Monocytes Relative: 9 %
Neutro Abs: 8.7 10*3/uL — ABNORMAL HIGH (ref 1.7–7.7)
Neutrophils Relative %: 68 %
Platelets: 161 10*3/uL (ref 150–400)
RBC: 4.58 MIL/uL (ref 4.22–5.81)
RDW: 15.7 % — ABNORMAL HIGH (ref 11.5–15.5)
WBC: 12.6 10*3/uL — ABNORMAL HIGH (ref 4.0–10.5)
nRBC: 0 % (ref 0.0–0.2)

## 2020-05-26 LAB — PROTEIN, URINE, RANDOM: Total Protein, Urine: 14 mg/dL

## 2020-05-26 MED ORDER — DEXAMETHASONE SODIUM PHOSPHATE 10 MG/ML IJ SOLN
10.0000 mg | Freq: Once | INTRAMUSCULAR | Status: AC
  Administered 2020-05-26: 10 mg via INTRAVENOUS
  Filled 2020-05-26: qty 1

## 2020-05-26 MED ORDER — DEXTROSE 5 % IV SOLN
Freq: Once | INTRAVENOUS | Status: AC
  Filled 2020-05-26: qty 250

## 2020-05-26 MED ORDER — DEXTROSE 5 % IV SOLN
Freq: Once | INTRAVENOUS | Status: DC
  Filled 2020-05-26: qty 250

## 2020-05-26 MED ORDER — SODIUM CHLORIDE 0.9 % IV SOLN
2400.0000 mg/m2 | INTRAVENOUS | Status: DC
  Administered 2020-05-26: 4300 mg via INTRAVENOUS
  Filled 2020-05-26: qty 86

## 2020-05-26 MED ORDER — LEUCOVORIN CALCIUM INJECTION 350 MG
700.0000 mg | Freq: Once | INTRAVENOUS | Status: AC
  Administered 2020-05-26: 700 mg via INTRAVENOUS
  Filled 2020-05-26: qty 10

## 2020-05-26 MED ORDER — SODIUM CHLORIDE 0.9 % IV SOLN
4.4000 mg/kg | Freq: Once | INTRAVENOUS | Status: AC
  Administered 2020-05-26: 300 mg via INTRAVENOUS
  Filled 2020-05-26: qty 12

## 2020-05-26 MED ORDER — FLUOROURACIL CHEMO INJECTION 2.5 GM/50ML
400.0000 mg/m2 | Freq: Once | INTRAVENOUS | Status: AC
  Administered 2020-05-26: 700 mg via INTRAVENOUS
  Filled 2020-05-26: qty 14

## 2020-05-26 MED ORDER — PALONOSETRON HCL INJECTION 0.25 MG/5ML
0.2500 mg | Freq: Once | INTRAVENOUS | Status: AC
  Administered 2020-05-26: 0.25 mg via INTRAVENOUS
  Filled 2020-05-26: qty 5

## 2020-05-26 NOTE — Progress Notes (Signed)
Pt in for follow up, denies any concerns or difficulties.   °

## 2020-05-27 NOTE — Progress Notes (Signed)
Hematology/Oncology Consult note [facility]  [phone] Fax:([phone]  Patient Care Team: Jodi Marble, MD as PCP - General (Internal Medicine) Clent Jacks, RN as Oncology Nurse Navigator   Name of the patient: Eric Simon  [id number]  1951-10-22   Date of visit: 05/27/20  Diagnosis- metastatic colon cancer with lymph node metastases K-ras/BRAF wild-type  Chief complaint/ Reason for visit-on treatment assessment prior to cycle 19 of 5-FU Mvasi chemotherapy  Heme/Onc history: Patient is a 69 yr old male with >40 pack year history of smoking. He currently smokes 0.5ppd. he presented to the ER with symptoms of left sided chest pain and left arm pain. Troponin was negative ekg was unremarkable. Ct chest showed no PE. He was incidentally noted to have mediastinal and retrocrural adenopathy and a 2.1X2.3X4.4 cm retroaortic soft tissue lesion in the posterior left chest. 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 Low-grade activity in the left lateral fifth and sixth ribs associated with nondisplaced fractures likely reflecting healing response problem malignancy.  Patient underwent CT-guided biopsy of the retroperitoneal lymph node pathology showed metastatic adenocarcinoma compatible with colorectal origin. CK7 negative. CK20 positive. CDX 2+. TTF-1 negative. PSA negative. This pattern of immunoreactivity supports the above diagnosis. Patient underwent colonoscopy which showed sigmoid mass that was consistent with adenocarcinoma. RASpanel testing showed that he was wild-type for both K-ras and BRAF   Interval history-patient currently feels well.  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  Appetite is good and weight has remained stable.  Denies any significant nausea or vomiting  ECOG PS-  1 Pain scale- 0   Review of systems- Review of Systems  Constitutional: Negative for chills, fever, malaise/fatigue and weight loss.  HENT: Negative for congestion, ear discharge and nosebleeds.   Eyes: Negative for blurred vision.  Respiratory: Negative for cough, hemoptysis, sputum production, shortness of breath and wheezing.   Cardiovascular: Negative for chest pain, palpitations, orthopnea and claudication.  Gastrointestinal: Negative for abdominal pain, blood in stool, constipation, diarrhea, heartburn, melena, nausea and vomiting.  Genitourinary: Negative for dysuria, flank pain, frequency, hematuria and urgency.  Musculoskeletal: Negative for back pain, joint pain and myalgias.  Skin: Negative for rash.  Neurological: Negative for dizziness, tingling, focal weakness, seizures, weakness and headaches.  Endo/Heme/Allergies: Does not bruise/bleed easily.  Psychiatric/Behavioral: Negative for depression and suicidal ideas. The patient does not have insomnia.       No Known Allergies   Past Medical History:  Diagnosis Date  . Colon cancer ([place])   . COPD (chronic obstructive pulmonary disease) ([place])   . Hyperlipidemia      Past Surgical History:  Procedure Laterality Date  . COLONOSCOPY WITH PROPOFOL N/A 06/26/2019   Procedure: COLONOSCOPY WITH PROPOFOL;  Surgeon: [name], MD;  Location: [facility] ENDOSCOPY;  Service: Gastroenterology;  Laterality: N/A;  . PORTA CATH INSERTION N/A 06/25/2019   Procedure: PORTA CATH INSERTION;  Surgeon: Algernon Huxley, MD;  Location: [place] CV LAB;  Service: Cardiovascular;  Laterality: N/A;    Social History   Socioeconomic History  . Marital status: Single    Spouse name: Not on file  . Number of children: Not on file  . Years of education: Not on file  . Highest education level: Not on file  Occupational History  . Not on file  Tobacco Use  . Smoking status: Current Every Day Smoker  Packs/day: 0.50    Years: 40.00    Pack  years: 20.00    Types: Cigarettes  . Smokeless tobacco: Never Used  . Tobacco comment: smoking 40 years  Vaping Use  . Vaping Use: Never used  Substance and Sexual Activity  . Alcohol use: No  . Drug use: No  . Sexual activity: Not Currently  Other Topics Concern  . Not on file  Social History Narrative  . Not on file   Social Determinants of Health   Financial Resource Strain: Low Risk   . Difficulty of Paying Living Expenses: Not hard at all  Food Insecurity: No Food Insecurity  . Worried About Charity fundraiser in the Last Year: Never true  . Ran Out of Food in the Last Year: Never true  Transportation Needs: No Transportation Needs  . Lack of Transportation (Medical): No  . Lack of Transportation (Non-Medical): No  Physical Activity:   . Days of Exercise per Week:   . Minutes of Exercise per Session:   Stress: No Stress Concern Present  . Feeling of Stress : Not at all  Social Connections: Unknown  . Frequency of Communication with Friends and Family: More than three times a week  . Frequency of Social Gatherings with Friends and Family: Not on file  . Attends Religious Services: Not on file  . Active Member of Clubs or Organizations: Not on file  . Attends Archivist Meetings: Not on file  . Marital Status: Not on file  Intimate Partner Violence: Not At Risk  . Fear of Current or Ex-Partner: No  . Emotionally Abused: No  . Physically Abused: No  . Sexually Abused: No    Family History  Problem Relation Age of Onset  . Brain cancer Father      Current Outpatient Medications:  .  aspirin EC 81 MG tablet, Take 81 mg by mouth daily., Disp: , Rfl:  .  azelastine (ASTELIN) 0.1 % nasal spray, Place 1 spray into both nostrils 1 day or 1 dose., Disp: , Rfl:  .  cetirizine (ZYRTEC) 10 MG tablet, Take 1 tablet (10 mg total) by mouth daily. (Patient taking differently: Take 10 mg by mouth daily as needed. ), Disp: 30 tablet, Rfl: 0 .  chlorthalidone  (HYGROTON) 25 MG tablet, Take 1 tablet by mouth daily., Disp: , Rfl:  .  dexamethasone (DECADRON) 4 MG tablet, Take 2 tablets (8 mg total) by mouth daily. Start the day after chemotherapy for 2 days. Take with food., Disp: 30 tablet, Rfl: 1 .  DEXILANT 60 MG capsule, Take 1 capsule by mouth 1 day or 1 dose., Disp: , Rfl:  .  DULoxetine (CYMBALTA) 30 MG capsule, Take 1 capsule (30 mg total) by mouth 2 (two) times daily., Disp: 60 capsule, Rfl: 1 .  fluticasone (FLONASE) 50 MCG/ACT nasal spray, Place 2 sprays into both nostrils 1 day or 1 dose., Disp: , Rfl:  .  fluticasone (VERAMYST) 27.5 MCG/SPRAY nasal spray, Place 2 sprays into the nose daily., Disp: , Rfl:  .  lidocaine-prilocaine (EMLA) cream, Apply to affected area once, Disp: 30 g, Rfl: 3 .  montelukast (SINGULAIR) 10 MG tablet, Take 10 mg by mouth at bedtime., Disp: , Rfl:  .  nicotine (NICODERM CQ - DOSED IN MG/24 HOURS) 21 mg/24hr patch, Place 1 patch onto the skin 1 day or 1 dose., Disp: , Rfl:  .  pantoprazole (PROTONIX) 40 MG tablet, Take 40 mg by mouth daily., Disp: ,  Rfl:  .  simvastatin (ZOCOR) 20 MG tablet, Take 20 mg by mouth daily., Disp: , Rfl:  .  sucralfate (CARAFATE) 1 g tablet, Take 1 g by mouth 4 (four) times daily., Disp: , Rfl:  .  SYMBICORT 80-4.5 MCG/ACT inhaler, Inhale 2 puffs into the lungs 1 day or 1 dose., Disp: , Rfl:  .  tamsulosin (FLOMAX) 0.4 MG CAPS capsule, Take 0.4 mg by mouth., Disp: , Rfl:  .  magic mouthwash w/lidocaine SOLN, Take 5-10 mLs by mouth 4 (four) times daily as needed for mouth pain. (Patient not taking: Reported on 04/14/2020), Disp: 480 mL, Rfl: 3 .  ondansetron (ZOFRAN) 8 MG tablet, Take 1 tablet (8 mg total) by mouth 2 (two) times daily as needed for refractory nausea / vomiting. Start on day 3 after chemotherapy. (Patient not taking: Reported on 05/26/2020), Disp: 30 tablet, Rfl: 1 .  potassium chloride SA (K-DUR) 20 MEQ tablet, Take 1 tablet (20 mEq total) by mouth daily. (Patient not taking:  Reported on 04/14/2020), Disp: 14 tablet, Rfl: 0 .  pregabalin (LYRICA) 75 MG capsule, Take 1 capsule (75 mg total) by mouth 2 (two) times daily. (Patient not taking: Reported on 05/05/2020), Disp: 60 capsule, Rfl: 2 .  prochlorperazine (COMPAZINE) 10 MG tablet, Take 1 tablet (10 mg total) by mouth every 6 (six) hours as needed (Nausea or vomiting). (Patient not taking: Reported on 04/14/2020), Disp: 30 tablet, Rfl: 1 No current facility-administered medications for this visit.  Facility-Administered Medications Ordered in Other Visits:  .  sodium chloride flush (NS) 0.9 % injection 10 mL, 10 mL, Intracatheter, PRN, Sindy Guadeloupe, MD, 10 mL at 08/15/19 1405  Physical exam:  Vitals:   05/26/20 0855  BP: 115/77  Pulse: 90  Resp: 18  Temp: (!) 97.5 F (36.4 C)  TempSrc: Tympanic  SpO2: 99%  Weight: 145 lb 12.8 oz (66.1 kg)   Physical Exam Cardiovascular:     Rate and Rhythm: Normal rate and regular rhythm.     Heart sounds: Normal heart sounds.  Pulmonary:     Effort: Pulmonary effort is normal.     Breath sounds: Normal breath sounds.  Abdominal:     General: Bowel sounds are normal.     Palpations: Abdomen is soft.  Skin:    General: Skin is warm and dry.  Neurological:     Mental Status: He is alert and oriented to person, place, and time.      CMP Latest Ref Rng & Units 05/26/2020  Glucose 70 - 99 mg/dL 96  BUN 8 - 23 mg/dL 29(H)  Creatinine 0.61 - 1.24 mg/dL 0.80  Sodium 135 - 145 mmol/L 138  Potassium 3.5 - 5.1 mmol/L 4.3  Chloride 98 - 111 mmol/L 105  CO2 22 - 32 mmol/L 24  Calcium 8.9 - 10.3 mg/dL 9.1  Total Protein 6.5 - 8.1 g/dL 7.4  Total Bilirubin 0.3 - 1.2 mg/dL 0.6  Alkaline Phos 38 - 126 U/L 151(H)  AST 15 - 41 U/L 41  ALT 0 - 44 U/L 42   CBC Latest Ref Rng & Units 05/26/2020  WBC 4.0 - 10.5 K/uL 12.6(H)  Hemoglobin 13.0 - 17.0 g/dL 14.6  Hematocrit 39 - 52 % 43.4  Platelets 150 - 400 K/uL 161    No images are attached to the encounter.  CT Abdomen  Pelvis W Contrast  Result Date: 04/28/2020 CLINICAL DATA:  Colon cancer treated with chemotherapy.  Restaging. EXAM: CT ABDOMEN AND PELVIS WITH CONTRAST TECHNIQUE:  Multidetector CT imaging of the abdomen and pelvis was performed using the standard protocol following bolus administration of intravenous contrast. CONTRAST:  175m OMNIPAQUE IOHEXOL 300 MG/ML  SOLN COMPARISON:  01/14/2020. FINDINGS: Lower chest: Dependent atelectasis in the lungs bilaterally. Heart size normal. Atherosclerotic calcification of the aorta and coronary arteries. No pericardial or pleural effusion. Hepatobiliary: Liver is slightly decreased in attenuation diffusely. Liver and gallbladder are otherwise unremarkable. No biliary ductal dilatation. Pancreas: Negative. Spleen: Measures 13.3 cm, similar. Adrenals/Urinary Tract: Adrenal glands are unremarkable. Subcentimeter low-attenuation lesions in the kidneys are too small to definitively characterize but statistically, cysts are likely. Ureters are decompressed. Bladder is grossly unremarkable. Stomach/Bowel: Small hiatal hernia. Stomach, small bowel, appendix and colon are otherwise unremarkable. Vascular/Lymphatic: Atherosclerotic calcification of the aorta without aneurysm. Left iliac chain lymph nodes have increased in size in the interval. Index left common iliac lymph node measures 12 mm (2/53), previously 5 mm. Index left external iliac lymph node measures 11 mm (2/73), previously 8 mm. No additional pathologically enlarged lymph nodes in the abdomen or pelvis. Reproductive: Prostate is slightly enlarged. Other: No free fluid. Mesenteries and peritoneum are unremarkable. Small umbilical hernia contains fat. Musculoskeletal: Degenerative changes in the spine. No worrisome lytic or sclerotic lesions. IMPRESSION: 1. Enlarging left iliac chain adenopathy, indicative of metastatic disease. 2. Hepatic steatosis. 3. Mild splenomegaly, similar. 4. Mild prostate enlargement. 5. Aortic  atherosclerosis (ICD10-I70.0). Coronary artery calcification. Electronically Signed   By: MLorin PicketM.D.   On: 04/28/2020 10:48     Assessment and plan- Patient is a 69y.o. male with metastatic colon cancer TX NX M1 with generalized lymphadenopathy K-ras/beta wild type.He is here for on treatment assessment prior to cycle 19 of 5-FU Mvasi chemotherapy  Counts okay to proceed with cycle 19 of 5-FU Mvasi chemotherapy today.  He will come back on day 3 for pump disconnect and receive Udenyca on that day.  I will see him back in 3 weeks for cycle 20.  Recent scans show mild progression but no new spots.  Plan is to continue present treatment and consider adding irinotecan if there is continued progression  Chemo-induced peripheral neuropathy: Grade 1 stable.  Continue Cymbalta   Visit Diagnosis 1. Encounter for antineoplastic chemotherapy   2. 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 ([place]   3. Chemotherapy-induced peripheral neuropathy ([place]      Dr. ARanda Evens MD, MPH [id number]/15/2021 12:05 PM

## 2020-05-27 NOTE — Telephone Encounter (Signed)
Documented phone call in different section regarding call to find out if pt had had a colonoscopy.

## 2020-05-28 ENCOUNTER — Inpatient Hospital Stay: Payer: Medicare HMO

## 2020-05-28 ENCOUNTER — Telehealth: Payer: Self-pay | Admitting: *Deleted

## 2020-05-28 ENCOUNTER — Other Ambulatory Visit: Payer: Self-pay

## 2020-05-28 VITALS — BP 118/69 | HR 78 | Temp 98.6°F | Resp 18

## 2020-05-28 DIAGNOSIS — C772 Secondary and unspecified malignant neoplasm of intra-abdominal lymph nodes: Secondary | ICD-10-CM

## 2020-05-28 DIAGNOSIS — Z5112 Encounter for antineoplastic immunotherapy: Secondary | ICD-10-CM | POA: Diagnosis not present

## 2020-05-28 DIAGNOSIS — C189 Malignant neoplasm of colon, unspecified: Secondary | ICD-10-CM

## 2020-05-28 MED ORDER — PEGFILGRASTIM-CBQV 6 MG/0.6ML ~~LOC~~ SOSY
6.0000 mg | PREFILLED_SYRINGE | Freq: Once | SUBCUTANEOUS | Status: AC
  Administered 2020-05-28: 6 mg via SUBCUTANEOUS
  Filled 2020-05-28: qty 0.6

## 2020-05-28 MED ORDER — HEPARIN SOD (PORK) LOCK FLUSH 100 UNIT/ML IV SOLN
500.0000 [IU] | Freq: Once | INTRAVENOUS | Status: AC | PRN
  Administered 2020-05-28: 500 [IU]
  Filled 2020-05-28: qty 5

## 2020-05-28 MED ORDER — HEPARIN SOD (PORK) LOCK FLUSH 100 UNIT/ML IV SOLN
INTRAVENOUS | Status: AC
  Filled 2020-05-28: qty 5

## 2020-05-28 MED ORDER — SODIUM CHLORIDE 0.9% FLUSH
10.0000 mL | INTRAVENOUS | Status: DC | PRN
  Administered 2020-05-28: 10 mL
  Filled 2020-05-28: qty 10

## 2020-05-28 NOTE — Telephone Encounter (Signed)
Patient came in today to get his pump DC'd and he mentioned that his next appointment does not have his 5-FU on it and does not have a DC pump appointment.  Checked into the matter and we missed the 5-FU on the infusion wrap-up and it is now fixed I have called the patient we have added time for the infusion and we have added a appointment for him to have a DC pump 2 days later.  And pt. agreeable to plan

## 2020-06-17 ENCOUNTER — Inpatient Hospital Stay: Payer: Medicare HMO | Attending: Oncology

## 2020-06-17 ENCOUNTER — Encounter: Payer: Self-pay | Admitting: Oncology

## 2020-06-17 ENCOUNTER — Inpatient Hospital Stay (HOSPITAL_BASED_OUTPATIENT_CLINIC_OR_DEPARTMENT_OTHER): Payer: Medicare HMO | Admitting: Oncology

## 2020-06-17 ENCOUNTER — Inpatient Hospital Stay: Payer: Medicare HMO

## 2020-06-17 ENCOUNTER — Other Ambulatory Visit: Payer: Self-pay

## 2020-06-17 VITALS — BP 127/70 | HR 78 | Temp 97.1°F | Wt 149.4 lb

## 2020-06-17 DIAGNOSIS — C189 Malignant neoplasm of colon, unspecified: Secondary | ICD-10-CM

## 2020-06-17 DIAGNOSIS — Z452 Encounter for adjustment and management of vascular access device: Secondary | ICD-10-CM | POA: Insufficient documentation

## 2020-06-17 DIAGNOSIS — F17210 Nicotine dependence, cigarettes, uncomplicated: Secondary | ICD-10-CM | POA: Insufficient documentation

## 2020-06-17 DIAGNOSIS — C786 Secondary malignant neoplasm of retroperitoneum and peritoneum: Secondary | ICD-10-CM | POA: Insufficient documentation

## 2020-06-17 DIAGNOSIS — Z5111 Encounter for antineoplastic chemotherapy: Secondary | ICD-10-CM | POA: Insufficient documentation

## 2020-06-17 DIAGNOSIS — G620 Drug-induced polyneuropathy: Secondary | ICD-10-CM

## 2020-06-17 DIAGNOSIS — J449 Chronic obstructive pulmonary disease, unspecified: Secondary | ICD-10-CM | POA: Diagnosis not present

## 2020-06-17 DIAGNOSIS — C772 Secondary and unspecified malignant neoplasm of intra-abdominal lymph nodes: Secondary | ICD-10-CM

## 2020-06-17 DIAGNOSIS — D6959 Other secondary thrombocytopenia: Secondary | ICD-10-CM | POA: Diagnosis not present

## 2020-06-17 DIAGNOSIS — Z5112 Encounter for antineoplastic immunotherapy: Secondary | ICD-10-CM | POA: Diagnosis not present

## 2020-06-17 DIAGNOSIS — Z5189 Encounter for other specified aftercare: Secondary | ICD-10-CM | POA: Insufficient documentation

## 2020-06-17 DIAGNOSIS — C187 Malignant neoplasm of sigmoid colon: Secondary | ICD-10-CM | POA: Insufficient documentation

## 2020-06-17 DIAGNOSIS — T451X5A Adverse effect of antineoplastic and immunosuppressive drugs, initial encounter: Secondary | ICD-10-CM

## 2020-06-17 LAB — CBC WITH DIFFERENTIAL/PLATELET
Abs Immature Granulocytes: 0.07 10*3/uL (ref 0.00–0.07)
Basophils Absolute: 0.1 10*3/uL (ref 0.0–0.1)
Basophils Relative: 1 %
Eosinophils Absolute: 0.2 10*3/uL (ref 0.0–0.5)
Eosinophils Relative: 2 %
HCT: 41.2 % (ref 39.0–52.0)
Hemoglobin: 13.7 g/dL (ref 13.0–17.0)
Immature Granulocytes: 1 %
Lymphocytes Relative: 21 %
Lymphs Abs: 2 10*3/uL (ref 0.7–4.0)
MCH: 31.5 pg (ref 26.0–34.0)
MCHC: 33.3 g/dL (ref 30.0–36.0)
MCV: 94.7 fL (ref 80.0–100.0)
Monocytes Absolute: 0.8 10*3/uL (ref 0.1–1.0)
Monocytes Relative: 8 %
Neutro Abs: 6.4 10*3/uL (ref 1.7–7.7)
Neutrophils Relative %: 67 %
Platelets: 145 10*3/uL — ABNORMAL LOW (ref 150–400)
RBC: 4.35 MIL/uL (ref 4.22–5.81)
RDW: 15.1 % (ref 11.5–15.5)
WBC: 9.5 10*3/uL (ref 4.0–10.5)
nRBC: 0 % (ref 0.0–0.2)

## 2020-06-17 LAB — COMPREHENSIVE METABOLIC PANEL
ALT: 31 U/L (ref 0–44)
AST: 39 U/L (ref 15–41)
Albumin: 3.6 g/dL (ref 3.5–5.0)
Alkaline Phosphatase: 98 U/L (ref 38–126)
Anion gap: 8 (ref 5–15)
BUN: 28 mg/dL — ABNORMAL HIGH (ref 8–23)
CO2: 26 mmol/L (ref 22–32)
Calcium: 8.9 mg/dL (ref 8.9–10.3)
Chloride: 105 mmol/L (ref 98–111)
Creatinine, Ser: 0.78 mg/dL (ref 0.61–1.24)
GFR calc Af Amer: >60 mL/min (ref >60)
GFR calc non Af Amer: >60 mL/min (ref >60)
Glucose, Bld: 96 mg/dL (ref 70–99)
Potassium: 4.1 mmol/L (ref 3.5–5.1)
Sodium: 139 mmol/L (ref 135–145)
Total Bilirubin: 0.8 mg/dL (ref 0.3–1.2)
Total Protein: 7.3 g/dL (ref 6.5–8.1)

## 2020-06-17 LAB — PROTEIN, URINE, RANDOM: Total Protein, Urine: 17 mg/dL

## 2020-06-17 MED ORDER — DEXAMETHASONE SODIUM PHOSPHATE 10 MG/ML IJ SOLN
10.0000 mg | Freq: Once | INTRAMUSCULAR | Status: AC
  Administered 2020-06-17: 10 mg via INTRAVENOUS
  Filled 2020-06-17: qty 1

## 2020-06-17 MED ORDER — LEUCOVORIN CALCIUM INJECTION 350 MG
700.0000 mg | Freq: Once | INTRAVENOUS | Status: AC
  Administered 2020-06-17: 700 mg via INTRAVENOUS
  Filled 2020-06-17: qty 25

## 2020-06-17 MED ORDER — SODIUM CHLORIDE 0.9 % IV SOLN
4.4000 mg/kg | Freq: Once | INTRAVENOUS | Status: AC
  Administered 2020-06-17: 300 mg via INTRAVENOUS
  Filled 2020-06-17: qty 12

## 2020-06-17 MED ORDER — PALONOSETRON HCL INJECTION 0.25 MG/5ML
0.2500 mg | Freq: Once | INTRAVENOUS | Status: AC
  Administered 2020-06-17: 0.25 mg via INTRAVENOUS
  Filled 2020-06-17: qty 5

## 2020-06-17 MED ORDER — SODIUM CHLORIDE 0.9% FLUSH
10.0000 mL | Freq: Once | INTRAVENOUS | Status: AC
  Administered 2020-06-17: 10 mL via INTRAVENOUS
  Filled 2020-06-17: qty 10

## 2020-06-17 MED ORDER — FLUOROURACIL CHEMO INJECTION 2.5 GM/50ML
400.0000 mg/m2 | Freq: Once | INTRAVENOUS | Status: AC
  Administered 2020-06-17: 700 mg via INTRAVENOUS
  Filled 2020-06-17: qty 14

## 2020-06-17 MED ORDER — SODIUM CHLORIDE 0.9 % IV SOLN
2400.0000 mg/m2 | INTRAVENOUS | Status: DC
  Administered 2020-06-17: 4300 mg via INTRAVENOUS
  Filled 2020-06-17: qty 86

## 2020-06-17 MED ORDER — HEPARIN SOD (PORK) LOCK FLUSH 100 UNIT/ML IV SOLN
500.0000 [IU] | Freq: Once | INTRAVENOUS | Status: DC
  Filled 2020-06-17: qty 5

## 2020-06-17 MED ORDER — SODIUM CHLORIDE 0.9 % IV SOLN
INTRAVENOUS | Status: DC
  Filled 2020-06-17: qty 250

## 2020-06-17 NOTE — Progress Notes (Signed)
Hematology/Oncology Consult note [facility]  Telephone:([phone] [phone]  Patient Care Team: Jodi Marble, MD as PCP - General (Internal Medicine) Clent Jacks, RN as Oncology Nurse Navigator   Name of the patient: Eric Simon  [id number]  [birth year]   Date of visit: 06/17/20  Diagnosis-  metastatic colon cancer with lymph node metastases K-ras/BRAF wild-type  Chief complaint/ Reason for visit-on treatment assessment prior to cycle 20 of 5-FU Mvasi chemotherapy  Heme/Onc history:  Patient is a69yrold male with >40 pack year history of smoking. He currently smokes 0.5ppd. he presented to the ER with symptoms of left sided chest pain and left arm pain. Troponin was negative ekg was unremarkable. Ct chest showed no PE. He was incidentally noted to have mediastinal and retrocrural adenopathy and a 2.1X2.3X4.4 cm retroaortic soft tissue lesion in the posterior left chest. 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 Low-grade activity in the left lateral fifth and sixth ribs associated with nondisplaced fractures likely reflecting healing response problem malignancy.  Patient underwent CT-guided biopsy of the retroperitoneal lymph node pathology showed metastatic adenocarcinoma compatible with colorectal origin. CK7 negative. CK20 positive. CDX 2+. TTF-1 negative. PSA negative. This pattern of immunoreactivity supports the above diagnosis. Patient underwent colonoscopy which showed sigmoid mass that was consistent with adenocarcinoma. RASpanel testing showed that he was wild-type for both K-ras and BRAF  Interval history-patient is tolerating chemotherapy well without any significant side effects.  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  Bowel movements are  regular.  Appetite is improving and he has gained weight  ECOG PS- 1 Pain scale- 0 Opioid associated constipation- no  Review of systems- Review of Systems  Constitutional: Negative for chills, fever, malaise/fatigue and weight loss.  HENT: Negative for congestion, ear discharge and nosebleeds.   Eyes: Negative for blurred vision.  Respiratory: Negative for cough, hemoptysis, sputum production, shortness of breath and wheezing.   Cardiovascular: Negative for chest pain, palpitations, orthopnea and claudication.  Gastrointestinal: Negative for abdominal pain, blood in stool, constipation, diarrhea, heartburn, melena, nausea and vomiting.  Genitourinary: Negative for dysuria, flank pain, frequency, hematuria and urgency.  Musculoskeletal: Negative for back pain, joint pain and myalgias.  Skin: Negative for rash.  Neurological: Negative for dizziness, tingling, focal weakness, seizures, weakness and headaches.  Endo/Heme/Allergies: Does not bruise/bleed easily.  Psychiatric/Behavioral: Negative for depression and suicidal ideas. The patient does not have insomnia.       No Known Allergies   Past Medical History:  Diagnosis Date  . [place] ([place]   . [place] disease[place]   . Hyperlipidemia      Past Surgical History:  Procedure Laterality Date  . COLONOSCOPY WITH PROPOFOL N/A 06/26/2019   Procedure: COLONOSCOPY WITH PROPOFOL;  Surgeon: AJonathon Bellows MD;  [facility];  Service: Gastroenterology;  Laterality: N/A;  . PORTA CATH INSERTION N/A 06/25/2019   Procedure: PORTA CATH INSERTION;  Surgeon: DAlgernon Huxley MD;  [facility];  Service: Cardiovascular;  Laterality: N/A;    Social History   Socioeconomic History  . Marital status: Single    Spouse name: Not on file  . Number of children: Not on file  . Years of education: Not on file  . Highest education level: Not on file  Occupational History  . Not on file  Tobacco Use  . Smoking status: Current Every Day Smoker    Packs/day: 0.50    Years: 40.00    Pack years: 20.00    Types: Cigarettes  . Smokeless tobacco: Never Used  . Tobacco comment: smoking 40 years  Vaping Use  . Vaping Use: Never used  Substance and Sexual Activity  . Alcohol use: No  . Drug use: No  . Sexual activity: Not Currently  Other Topics Concern  . Not on file  Social History Narrative  . Not on file   Social Determinants of Health   Financial Resource Strain:   . Difficulty of Paying Living Expenses:   Food Insecurity:   . Worried About Charity fundraiser in the Last Year:   . Arboriculturist in the Last Year:   Transportation Needs:   . Film/video editor (Medical):   [name] Lack of Transportation (Non-Medical):   Physical Activity:   . Days of Exercise per Week:   . Minutes of Exercise per Session:   Stress:   . Feeling of Stress :   Social Connections:   . Frequency of Communication with Friends and Family:   . Frequency of Social Gatherings with Friends and Family:   . Attends Religious Services:   . Active Member of Clubs or Organizations:   . Attends Archivist Meetings:   [name] Marital Status:   Intimate Partner Violence:   . Fear of Current or Ex-Partner:   . Emotionally Abused:   [name] Physically Abused:   . Sexually Abused:     Family History  Problem Relation Age of Onset  . Brain cancer Father      Current Outpatient Medications:  .  aspirin EC 81 MG tablet, Take 81 mg by mouth daily., Disp: , Rfl:  .  azelastine (ASTELIN) 0.1 % nasal spray, Place 1 spray into both nostrils 1 day or 1 dose., Disp: , Rfl:  .  cetirizine (ZYRTEC) 10 MG tablet, Take 1 tablet (10 mg total) by mouth daily. (Patient taking differently: Take 10 mg by mouth daily as needed. ), Disp: 30 tablet, Rfl: 0 .  chlorthalidone (HYGROTON) 25 MG tablet, Take 1 tablet by mouth daily., Disp: , Rfl:  .  dexamethasone (DECADRON) 4 MG tablet, Take 2 tablets (8 mg  total) by mouth daily. Start the day after chemotherapy for 2 days. Take with food., Disp: 30 tablet, Rfl: 1 .  DEXILANT 60 MG capsule, Take 1 capsule by mouth 1 day or 1 dose., Disp: , Rfl:  .  DULoxetine (CYMBALTA) 30 MG capsule, Take 1 capsule (30 mg total) by mouth 2 (two) times daily., Disp: 60 capsule, Rfl: 1 .  fluticasone (FLONASE) 50 MCG/ACT nasal spray, Place 2 sprays into both nostrils 1 day or 1 dose., Disp: , Rfl:  .  fluticasone (VERAMYST) 27.5 MCG/SPRAY nasal spray, Place 2 sprays into the nose daily., Disp: , Rfl:  .  lidocaine-prilocaine (EMLA) cream, Apply to affected area once, Disp: 30 g, Rfl: 3 .  magic mouthwash w/lidocaine SOLN, Take 5-10 mLs by mouth 4 (four) times daily as needed for mouth pain. (Patient not taking: Reported on 04/14/2020), Disp: 480 mL, Rfl: 3 .  montelukast (SINGULAIR) 10 MG tablet, Take 10 mg by mouth at bedtime., Disp: , Rfl:  .  nicotine (NICODERM CQ - DOSED IN MG/24 HOURS) 21 mg/24hr patch, Place 1 patch onto the skin 1 day or 1 dose., Disp: , Rfl:  .  ondansetron (ZOFRAN) 8  MG tablet, Take 1 tablet (8 mg total) by mouth 2 (two) times daily as needed for refractory nausea / vomiting. Start on day 3 after chemotherapy. (Patient not taking: Reported on 05/26/2020), Disp: 30 tablet, Rfl: 1 .  pantoprazole (PROTONIX) 40 MG tablet, Take 40 mg by mouth daily., Disp: , Rfl:  .  potassium chloride SA (K-DUR) 20 MEQ tablet, Take 1 tablet (20 mEq total) by mouth daily. (Patient not taking: Reported on 04/14/2020), Disp: 14 tablet, Rfl: 0 .  pregabalin (LYRICA) 75 MG capsule, Take 1 capsule (75 mg total) by mouth 2 (two) times daily. (Patient not taking: Reported on 05/05/2020), Disp: 60 capsule, Rfl: 2 .  prochlorperazine (COMPAZINE) 10 MG tablet, Take 1 tablet (10 mg total) by mouth every 6 (six) hours as needed (Nausea or vomiting). (Patient not taking: Reported on 04/14/2020), Disp: 30 tablet, Rfl: 1 .  simvastatin (ZOCOR) 20 MG tablet, Take 20 mg by mouth daily., Disp:  , Rfl:  .  sucralfate (CARAFATE) 1 g tablet, Take 1 g by mouth 4 (four) times daily., Disp: , Rfl:  .  SYMBICORT 80-4.5 MCG/ACT inhaler, Inhale 2 puffs into the lungs 1 day or 1 dose., Disp: , Rfl:  .  tamsulosin (FLOMAX) 0.4 MG CAPS capsule, Take 0.4 mg by mouth., Disp: , Rfl:  No current facility-administered medications for this visit.  Facility-Administered Medications Ordered in Other Visits:  .  heparin lock flush 100 unit/mL, 500 Units, Intravenous, Once, Randa Evens C, MD .  sodium chloride flush (NS) 0.9 % injection 10 mL, 10 mL, Intracatheter, PRN, Sindy Guadeloupe, MD, 10 mL at 08/15/19 1405 .  sodium chloride flush (NS) 0.9 % injection 10 mL, 10 mL, Intravenous, Once, Sindy Guadeloupe, MD  Physical exam:  Vitals:   06/17/20 0848  BP: 127/70  Pulse: 78  Temp: (!) 97.1 F (36.2 C)  TempSrc: Tympanic  Weight: 149 lb 6.4 oz (67.8 kg)   Physical Exam HENT:     Head: Normocephalic and atraumatic.  Eyes:     Pupils: Pupils are equal, round, and reactive to light.  Cardiovascular:     Rate and Rhythm: Normal rate and regular rhythm.     Heart sounds: Normal heart sounds.  Pulmonary:     Effort: Pulmonary effort is normal.     Breath sounds: Normal breath sounds.  Abdominal:     General: Bowel sounds are normal.     Palpations: Abdomen is soft.  Musculoskeletal:     Cervical back: Normal range of motion.  Skin:    General: Skin is warm and dry.  Neurological:     Mental Status: He is alert and oriented to person, place, and time.      CMP Latest Ref Rng & Units 06/17/2020  Glucose 70 - 99 mg/dL 96  BUN 8 - 23 mg/dL 28(H)  Creatinine 0.61 - 1.24 mg/dL 0.78  Sodium 135 - 145 mmol/L 139  Potassium 3.5 - 5.1 mmol/L 4.1  Chloride 98 - 111 mmol/L 105  CO2 22 - 32 mmol/L 26  Calcium 8.9 - 10.3 mg/dL 8.9  Total Protein 6.5 - 8.1 g/dL 7.3  Total Bilirubin 0.3 - 1.2 mg/dL 0.8  Alkaline Phos 38 - 126 U/L 98  AST 15 - 41 U/L 39  ALT 0 - 44 U/L 31   CBC Latest Ref Rng &  Units 06/17/2020  WBC 4.0 - 10.5 K/uL 9.5  Hemoglobin 13.0 - 17.0 g/dL 13.7  Hematocrit 39 - 52 % 41.2  Platelets 150 - 400 K/uL 145(L)     Assessment and plan- Patient is a 69 y.o. male with metastatic colon cancer TX NX M1 with generalized lymphadenopathy K-ras/beta wild type. He is here for on treatment assessment prior to cycle 20 of 5-FU Mvasi chemotherapy  Counts okay to proceed with cycle 20 of 5-FU Mvasi chemotherapy today.Blood pressure is stable and urine protein remains trace.  I will see him back in 3 weeks time with CBC with differential's, CMP, CEA and urine protein for cycle 21.  I will repeat scans next month.  Chemo-induced peripheral neuropathy: Currently stable on Cymbalta  Mild thrombocytopenia: Likely secondary to chemotherapy.  Continue to monitor   Visit Diagnosis 1. Encounter for antineoplastic chemotherapy   2. 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 ([place])   3. Chemotherapy-induced peripheral neuropathy ([place])   4. Chemotherapy-induced thrombocytopenia      Dr. Randa Evens, MD, MPH [facility] [phone] 06/17/2020 9:08 AM

## 2020-06-17 NOTE — Progress Notes (Signed)
Patient denies any concerns today.  

## 2020-06-19 ENCOUNTER — Other Ambulatory Visit: Payer: Self-pay

## 2020-06-19 ENCOUNTER — Inpatient Hospital Stay: Payer: Medicare HMO

## 2020-06-19 VITALS — BP 133/81 | HR 73 | Temp 97.0°F | Resp 18

## 2020-06-19 DIAGNOSIS — C772 Secondary and unspecified malignant neoplasm of intra-abdominal lymph nodes: Secondary | ICD-10-CM

## 2020-06-19 DIAGNOSIS — C189 Malignant neoplasm of colon, unspecified: Secondary | ICD-10-CM

## 2020-06-19 DIAGNOSIS — Z5112 Encounter for antineoplastic immunotherapy: Secondary | ICD-10-CM | POA: Diagnosis not present

## 2020-06-19 MED ORDER — PEGFILGRASTIM-CBQV 6 MG/0.6ML ~~LOC~~ SOSY
6.0000 mg | PREFILLED_SYRINGE | Freq: Once | SUBCUTANEOUS | Status: AC
  Administered 2020-06-19: 6 mg via SUBCUTANEOUS
  Filled 2020-06-19: qty 0.6

## 2020-06-19 MED ORDER — HEPARIN SOD (PORK) LOCK FLUSH 100 UNIT/ML IV SOLN
500.0000 [IU] | Freq: Once | INTRAVENOUS | Status: AC | PRN
  Administered 2020-06-19: 500 [IU]
  Filled 2020-06-19: qty 5

## 2020-06-19 NOTE — Progress Notes (Signed)
Pt reports pain around his port site. Port flushes without difficulty, no swelling or redness noted, pt denies pain with flushing. Once tape removed and port de-accessed pt states he no longer feels any pain. Port site WNL, no redness or swelling noted. Faythe Casa NP at chairside to assess port site also. No orders at this time. Pt aware to notify office if any pain, swelling or redness at port site occur. Pt verbalizes understanding. Pt stable at discharge.

## 2020-06-23 ENCOUNTER — Inpatient Hospital Stay (HOSPITAL_BASED_OUTPATIENT_CLINIC_OR_DEPARTMENT_OTHER): Payer: Medicare HMO | Admitting: Hospice and Palliative Medicine

## 2020-06-23 DIAGNOSIS — Z515 Encounter for palliative care: Secondary | ICD-10-CM

## 2020-06-23 NOTE — Progress Notes (Signed)
I was unable to reach patient by phone or text for video visit.  Will reschedule.

## 2020-07-03 ENCOUNTER — Other Ambulatory Visit: Payer: Self-pay | Admitting: *Deleted

## 2020-07-03 MED ORDER — PREGABALIN 75 MG PO CAPS: 75.0000 mg | ORAL_CAPSULE | Freq: Two times a day (BID) | ORAL | 2 refills | Status: DC

## 2020-07-06 NOTE — Progress Notes (Signed)
Hematology/Oncology Consult note [facility]  [phone] [phone]  Patient Care Team: Jodi Marble, MD as PCP - General (Internal Medicine) Clent Jacks, RN as Oncology Nurse Navigator   Name of the patient: Eric Simon  [id number]  [birth year]   Date of visit: 07/06/20  Diagnosis- metastatic colon cancer with lymph node metastases K-ras/BRAF wild-type  Chief complaint/ Reason for visit-on treatment assessment prior to next cycle of 5-FU and Mvasi chemotherapy.  Cycle 21  Heme/Onc history: Patient is a69yrold male with >40 pack year history of smoking. He currently smokes 0.5ppd. he presented to the ER with symptoms of left sided chest pain and left arm pain. Troponin was negative ekg was unremarkable. Ct chest showed no PE. He was incidentally noted to have mediastinal and retrocrural adenopathy and a 2.1X2.3X4.4 cm retroaortic soft tissue lesion in the posterior left chest. 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 Low-grade activity in the left lateral fifth and sixth ribs associated with nondisplaced fractures likely reflecting healing response problem malignancy.  Patient underwent CT-guided biopsy of the retroperitoneal lymph node pathology showed metastatic adenocarcinoma compatible with colorectal origin. CK7 negative. CK20 positive. CDX 2+. TTF-1 negative. PSA negative. This pattern of immunoreactivity supports the above diagnosis. Patient underwent colonoscopy which showed sigmoid mass that was consistent with adenocarcinoma. RASpanel testing showed that he was wild-type for both K-ras and BRAF  Interval history-he still has neuropathy in his bilateral feet which is overall stable.  Reports discomfort but no overt pain.  Symptoms have been stable on Cymbalta.  Denies any other new  complaints at this time  ECOG PS- 1 Pain scale- 0   Review of systems- Review of Systems  Constitutional: Negative for chills, fever, malaise/fatigue and weight loss.  HENT: Negative for congestion, ear discharge and nosebleeds.   Eyes: Negative for blurred vision.  Respiratory: Negative for cough, hemoptysis, sputum production, shortness of breath and wheezing.   Cardiovascular: Negative for chest pain, palpitations, orthopnea and claudication.  Gastrointestinal: Negative for abdominal pain, blood in stool, constipation, diarrhea, heartburn, melena, nausea and vomiting.  Genitourinary: Negative for dysuria, flank pain, frequency, hematuria and urgency.  Musculoskeletal: Negative for back pain, joint pain and myalgias.  Skin: Negative for rash.  Neurological: Positive for sensory change (Peripheral neuropathy). Negative for dizziness, tingling, focal weakness, seizures, weakness and headaches.  Endo/Heme/Allergies: Does not bruise/bleed easily.  Psychiatric/Behavioral: Negative for depression and suicidal ideas. The patient does not have insomnia.        No Known Allergies   Past Medical History:  Diagnosis Date  . [place] cancer ([place]   . COPD ([place]ronic obstructive pulmonary disease) ([place]   . Hyperlipidemia      Past Surgical History:  Procedure Laterality Date  . COLONOSCOPY WITH PROPOFOL N/A 06/26/2019   Procedure: COLONOSCOPY WITH PROPOFOL;  Surgeon: AJonathon Bellows MD;  [facility];  Service: Gastroenterology;  Laterality: N/A;  . PORTA CATH INSERTION N/A 06/25/2019   Procedure: PORTA CATH INSERTION;  Surgeon: DAlgernon Huxley MD;  [facility];  Service: Cardiovascular;  Laterality: N/A;    Social History   Socioeconomic History  . Marital status: Single    Spouse name: Not on file  . Number of children: Not on file  . Years of education: Not on file  . Highest education level: Not on file  Occupational History  . Not  on file  Tobacco Use   . Smoking status: Current Every Day Smoker    Packs/day: 0.50    Years: 40.00    Pack years: 20.00    Types: Cigarettes  . Smokeless tobacco: Never Used  . Tobacco comment: smoking 40 years  Vaping Use  . Vaping Use: Never used  Substance and Sexual Activity  . Alcohol use: No  . Drug use: No  . Sexual activity: Not Currently  Other Topics Concern  . Not on file  Social History Narrative  . Not on file   Social Determinants of Health   Financial Resource Strain:   . Difficulty of Paying Living Expenses:   Food Insecurity:   . Worried About Charity fundraiser in the Last Year:   . Arboriculturist in the Last Year:   Transportation Needs:   . Film/video editor (Medical):   [name] Lack of Transportation (Non-Medical):   Physical Activity:   . Days of Exercise per Week:   . Minutes of Exercise per Session:   Stress:   . Feeling of Stress :   Social Connections:   . Frequency of Communication with Friends and Family:   . Frequency of Social Gatherings with Friends and Family:   . Attends Religious Services:   . Active Member of Clubs or Organizations:   . Attends Archivist Meetings:   [name] Marital Status:   Intimate Partner Violence:   . Fear of Current or Ex-Partner:   . Emotionally Abused:   [name] Physically Abused:   . Sexually Abused:     Family History  Problem Relation Age of Onset  . Brain cancer Father      Current Outpatient Medications:  .  aspirin EC 81 MG tablet, Take 81 mg by mouth daily., Disp: , Rfl:  .  azelastine (ASTELIN) 0.1 % nasal spray, Place 1 spray into both nostrils 1 day or 1 dose., Disp: , Rfl:  .  cetirizine (ZYRTEC) 10 MG tablet, Take 1 tablet (10 mg total) by mouth daily. (Patient taking differently: Take 10 mg by mouth daily as needed. ), Disp: 30 tablet, Rfl: 0 .  chlorthalidone (HYGROTON) 25 MG tablet, Take 1 tablet by mouth daily., Disp: , Rfl:  .  dexamethasone (DECADRON) 4 MG tablet, Take 2 tablets (8 mg total) by mouth  daily. Start the day after chemotherapy for 2 days. Take with food., Disp: 30 tablet, Rfl: 1 .  DEXILANT 60 MG capsule, Take 1 capsule by mouth 1 day or 1 dose., Disp: , Rfl:  .  DULoxetine (CYMBALTA) 30 MG capsule, Take 1 capsule (30 mg total) by mouth 2 (two) times daily., Disp: 60 capsule, Rfl: 1 .  fluticasone (FLONASE) 50 MCG/ACT nasal spray, Place 2 sprays into both nostrils 1 day or 1 dose., Disp: , Rfl:  .  fluticasone (VERAMYST) 27.5 MCG/SPRAY nasal spray, Place 2 sprays into the nose daily., Disp: , Rfl:  .  lidocaine-prilocaine (EMLA) cream, Apply to affected area once, Disp: 30 g, Rfl: 3 .  magic mouthwash w/lidocaine SOLN, Take 5-10 mLs by mouth 4 (four) times daily as needed for mouth pain. (Patient not taking: Reported on 04/14/2020), Disp: 480 mL, Rfl: 3 .  montelukast (SINGULAIR) 10 MG tablet, Take 10 mg by mouth at bedtime., Disp: , Rfl:  .  nicotine (NICODERM CQ - DOSED IN MG/24 HOURS) 21 mg/24hr patch, Place 1 patch onto the skin 1 day or 1 dose., Disp: , Rfl:  .  ondansetron (ZOFRAN) 8 MG tablet, Take 1 tablet (8 mg total) by mouth 2 (two) times daily as needed for refractory nausea / vomiting. Start on day 3 after chemotherapy. (Patient not taking: Reported on 05/26/2020), Disp: 30 tablet, Rfl: 1 .  pantoprazole (PROTONIX) 40 MG tablet, Take 40 mg by mouth daily., Disp: , Rfl:  .  potassium chloride SA (K-DUR) 20 MEQ tablet, Take 1 tablet (20 mEq total) by mouth daily. (Patient not taking: Reported on 04/14/2020), Disp: 14 tablet, Rfl: 0 .  pregabalin (LYRICA) 75 MG capsule, Take 1 capsule (75 mg total) by mouth 2 (two) times daily., Disp: 60 capsule, Rfl: 2 .  prochlorperazine (COMPAZINE) 10 MG tablet, Take 1 tablet (10 mg total) by mouth every 6 (six) hours as needed (Nausea or vomiting). (Patient not taking: Reported on 04/14/2020), Disp: 30 tablet, Rfl: 1 .  simvastatin (ZOCOR) 20 MG tablet, Take 20 mg by mouth daily., Disp: , Rfl:  .  sucralfate (CARAFATE) 1 g tablet, Take 1 g by  mouth 4 (four) times daily., Disp: , Rfl:  .  SYMBICORT 80-4.5 MCG/ACT inhaler, Inhale 2 puffs into the lungs 1 day or 1 dose., Disp: , Rfl:  .  tamsulosin (FLOMAX) 0.4 MG CAPS capsule, Take 0.4 mg by mouth., Disp: , Rfl:  No current facility-administered medications for this visit.  Facility-Administered Medications Ordered in Other Visits:  .  sodium chloride flush (NS) 0.9 % injection 10 mL, 10 mL, Intracatheter, PRN, Sindy Guadeloupe, MD, 10 mL at 08/15/19 1405  Physical exam:  Vitals:   07/07/20 0833  BP: 120/70  Pulse: 74  Resp: 16  Temp: (!) 96.5 F (35.8 C)  TempSrc: Tympanic  SpO2: 99%  Weight: 147 lb 3.2 oz (66.8 kg)   Physical Exam Constitutional:      General: He is not in acute distress. Cardiovascular:     Rate and Rhythm: Normal rate and regular rhythm.     Heart sounds: Normal heart sounds.  Pulmonary:     Effort: Pulmonary effort is normal.     Breath sounds: Normal breath sounds.  Abdominal:     General: Bowel sounds are normal.     Palpations: Abdomen is soft.  Skin:    General: Skin is warm and dry.  Neurological:     Mental Status: He is alert and oriented to person, place, and time.      CMP Latest Ref Rng & Units 06/17/2020  Glucose 70 - 99 mg/dL 96  BUN 8 - 23 mg/dL 28(H)  Creatinine 0.61 - 1.24 mg/dL 0.78  Sodium 135 - 145 mmol/L 139  Potassium 3.5 - 5.1 mmol/L 4.1  Chloride 98 - 111 mmol/L 105  CO2 22 - 32 mmol/L 26  Calcium 8.9 - 10.3 mg/dL 8.9  Total Protein 6.5 - 8.1 g/dL 7.3  Total Bilirubin 0.3 - 1.2 mg/dL 0.8  Alkaline Phos 38 - 126 U/L 98  AST 15 - 41 U/L 39  ALT 0 - 44 U/L 31   CBC Latest Ref Rng & Units 06/17/2020  WBC 4.0 - 10.5 K/uL 9.5  Hemoglobin 13.0 - 17.0 g/dL 13.7  Hematocrit 39 - 52 % 41.2  Platelets 150 - 400 K/uL 145(L)     Assessment and plan- Patient is a 69 y.o. male with metastatic colon cancer TX NX M1 with generalized lymphadenopathy K-ras/beta wild type. He is here for on treatment assessment prior to cycle  21 of 5-FU Mvasi chemotherapy  Counts okay to proceed with cycle 21  of Mvasi 5-FU chemotherapy today.  He has been getting chemotherapy every 3 weeks for better tolerance.  He will come back on day 3 for pump disconnect and will receive Udenyca that day  I will see him back in 3 weeks with CBC with differential, CMP and urine protein for cycle 22.  Plan to repeat CT chest abdomen pelvis with contrast 4 to 5 weeks from now.  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  Discussed considering acupuncture and patient would like to hold off for now and see how his symptoms progress   Visit Diagnosis 1. Encounter for antineoplastic chemotherapy   2. 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 ([place])   3. Chemotherapy-induced peripheral neuropathy ([place])      Dr. Randa Evens, MD, MPH [facility] at [facility] [phone] 07/07/2020 8:50 AM

## 2020-07-07 ENCOUNTER — Inpatient Hospital Stay: Payer: Medicare HMO

## 2020-07-07 ENCOUNTER — Encounter: Payer: Self-pay | Admitting: Oncology

## 2020-07-07 ENCOUNTER — Ambulatory Visit: Payer: Medicare HMO | Admitting: Oncology

## 2020-07-07 ENCOUNTER — Other Ambulatory Visit: Payer: Self-pay

## 2020-07-07 ENCOUNTER — Inpatient Hospital Stay (HOSPITAL_BASED_OUTPATIENT_CLINIC_OR_DEPARTMENT_OTHER): Payer: Medicare HMO | Admitting: Hospice and Palliative Medicine

## 2020-07-07 ENCOUNTER — Other Ambulatory Visit: Payer: Medicare HMO

## 2020-07-07 ENCOUNTER — Inpatient Hospital Stay (HOSPITAL_BASED_OUTPATIENT_CLINIC_OR_DEPARTMENT_OTHER): Payer: Medicare HMO | Admitting: Oncology

## 2020-07-07 ENCOUNTER — Ambulatory Visit: Payer: Medicare HMO

## 2020-07-07 VITALS — BP 120/70 | HR 74 | Temp 96.5°F | Resp 16 | Wt 147.2 lb

## 2020-07-07 DIAGNOSIS — C189 Malignant neoplasm of colon, unspecified: Secondary | ICD-10-CM

## 2020-07-07 DIAGNOSIS — C772 Secondary and unspecified malignant neoplasm of intra-abdominal lymph nodes: Secondary | ICD-10-CM

## 2020-07-07 DIAGNOSIS — G620 Drug-induced polyneuropathy: Secondary | ICD-10-CM | POA: Diagnosis not present

## 2020-07-07 DIAGNOSIS — Z5111 Encounter for antineoplastic chemotherapy: Secondary | ICD-10-CM

## 2020-07-07 DIAGNOSIS — Z515 Encounter for palliative care: Secondary | ICD-10-CM | POA: Diagnosis not present

## 2020-07-07 DIAGNOSIS — T451X5A Adverse effect of antineoplastic and immunosuppressive drugs, initial encounter: Secondary | ICD-10-CM

## 2020-07-07 DIAGNOSIS — Z5112 Encounter for antineoplastic immunotherapy: Secondary | ICD-10-CM | POA: Diagnosis not present

## 2020-07-07 DIAGNOSIS — Z95828 Presence of other vascular implants and grafts: Secondary | ICD-10-CM

## 2020-07-07 LAB — COMPREHENSIVE METABOLIC PANEL
ALT: 25 U/L (ref 0–44)
AST: 28 U/L (ref 15–41)
Albumin: 4.1 g/dL (ref 3.5–5.0)
Alkaline Phosphatase: 101 U/L (ref 38–126)
Anion gap: 9 (ref 5–15)
BUN: 25 mg/dL — ABNORMAL HIGH (ref 8–23)
CO2: 25 mmol/L (ref 22–32)
Calcium: 9.1 mg/dL (ref 8.9–10.3)
Chloride: 101 mmol/L (ref 98–111)
Creatinine, Ser: 0.89 mg/dL (ref 0.61–1.24)
GFR calc Af Amer: >60 mL/min (ref >60)
GFR calc non Af Amer: >60 mL/min (ref >60)
Glucose, Bld: 98 mg/dL (ref 70–99)
Potassium: 4 mmol/L (ref 3.5–5.1)
Sodium: 135 mmol/L (ref 135–145)
Total Bilirubin: 0.8 mg/dL (ref 0.3–1.2)
Total Protein: 7.6 g/dL (ref 6.5–8.1)

## 2020-07-07 LAB — CBC WITH DIFFERENTIAL/PLATELET
Abs Immature Granulocytes: 0.08 10*3/uL — ABNORMAL HIGH (ref 0.00–0.07)
Basophils Absolute: 0.1 10*3/uL (ref 0.0–0.1)
Basophils Relative: 1 %
Eosinophils Absolute: 0.2 10*3/uL (ref 0.0–0.5)
Eosinophils Relative: 1 %
HCT: 42.7 % (ref 39.0–52.0)
Hemoglobin: 14.7 g/dL (ref 13.0–17.0)
Immature Granulocytes: 1 %
Lymphocytes Relative: 20 %
Lymphs Abs: 2.5 10*3/uL (ref 0.7–4.0)
MCH: 32.2 pg (ref 26.0–34.0)
MCHC: 34.4 g/dL (ref 30.0–36.0)
MCV: 93.4 fL (ref 80.0–100.0)
Monocytes Absolute: 1 10*3/uL (ref 0.1–1.0)
Monocytes Relative: 8 %
Neutro Abs: 9.1 10*3/uL — ABNORMAL HIGH (ref 1.7–7.7)
Neutrophils Relative %: 69 %
Platelets: 159 10*3/uL (ref 150–400)
RBC: 4.57 MIL/uL (ref 4.22–5.81)
RDW: 15.1 % (ref 11.5–15.5)
WBC: 13 10*3/uL — ABNORMAL HIGH (ref 4.0–10.5)
nRBC: 0 % (ref 0.0–0.2)

## 2020-07-07 LAB — PROTEIN, URINE, RANDOM: Total Protein, Urine: 16 mg/dL

## 2020-07-07 MED ORDER — SODIUM CHLORIDE 0.9 % IV SOLN: 4.4000 mg/kg | Freq: Once | INTRAVENOUS | Status: DC

## 2020-07-07 MED ORDER — PALONOSETRON HCL INJECTION 0.25 MG/5ML
0.2500 mg | Freq: Once | INTRAVENOUS | Status: AC
  Administered 2020-07-07: 0.25 mg via INTRAVENOUS
  Filled 2020-07-07: qty 5

## 2020-07-07 MED ORDER — LEUCOVORIN CALCIUM INJECTION 350 MG
700.0000 mg | Freq: Once | INTRAVENOUS | Status: AC
  Administered 2020-07-07: 700 mg via INTRAVENOUS
  Filled 2020-07-07: qty 25

## 2020-07-07 MED ORDER — SODIUM CHLORIDE 0.9% FLUSH
10.0000 mL | INTRAVENOUS | Status: DC | PRN
  Administered 2020-07-07: 10 mL via INTRAVENOUS
  Filled 2020-07-07: qty 10

## 2020-07-07 MED ORDER — DEXAMETHASONE SODIUM PHOSPHATE 10 MG/ML IJ SOLN
10.0000 mg | Freq: Once | INTRAMUSCULAR | Status: AC
  Administered 2020-07-07: 10 mg via INTRAVENOUS
  Filled 2020-07-07: qty 1

## 2020-07-07 MED ORDER — SODIUM CHLORIDE 0.9 % IV SOLN
2400.0000 mg/m2 | INTRAVENOUS | Status: DC
  Administered 2020-07-07: 4300 mg via INTRAVENOUS
  Filled 2020-07-07: qty 86

## 2020-07-07 MED ORDER — FLUOROURACIL CHEMO INJECTION 2.5 GM/50ML
400.0000 mg/m2 | Freq: Once | INTRAVENOUS | Status: AC
  Administered 2020-07-07: 700 mg via INTRAVENOUS
  Filled 2020-07-07: qty 14

## 2020-07-07 MED ORDER — SODIUM CHLORIDE 0.9 % IV SOLN
INTRAVENOUS | Status: DC
  Filled 2020-07-07: qty 250

## 2020-07-07 MED ORDER — SODIUM CHLORIDE 0.9 % IV SOLN
7.5000 mg/kg | Freq: Once | INTRAVENOUS | Status: AC
  Administered 2020-07-07: 500 mg via INTRAVENOUS
  Filled 2020-07-07: qty 16

## 2020-07-07 NOTE — Progress Notes (Signed)
Per Dr. [name], ok to incr Bevacizumab to 7.5 mg/kg q3 weeks since pt gets chemo q3 weeks.  Kennith Center, Pharm.D., CPP 07/07/2020@9 :26 AM

## 2020-07-07 NOTE — Progress Notes (Signed)
[place]  [phone] [phone]   Name: Eric Simon Date: 07/07/2020 MRN: [id number]  DOB: [birth year]  Patient Care Team: Jodi Marble, MD as PCP - General (Internal Medicine) Clent Jacks, RN as Oncology Nurse Navigator    REASON FOR CONSULTATION: Eric Simon is a 69 y.o. 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  Patient is on 5-FU Mvasi chemotherapy.  He was referred to palliative care to discuss goals and manage ongoing symptoms..   SOCIAL HISTORY:     reports that he has been smoking cigarettes. He has a 20.00 pack-year smoking history. He has never used smokeless tobacco. He reports that he does not drink alcohol and does not use drugs.   Patient is divorced.  He has no children.  He lives at home and rents a room to a friend.  Patient is originally from [place] but has lived here for 30 years.  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   ADVANCE DIRECTIVES:  Living will but not on file  CODE STATUS: DNR/DNI (MOST Form completed on 09/24/2019  PAST MEDICAL HISTORY: Past Medical History:  Diagnosis Date   Colon cancer ([place])    COPD (chronic obstructive pulmonary disease) ([place])    Hyperlipidemia     PAST SURGICAL HISTORY:  Past Surgical History:  Procedure Laterality Date   COLONOSCOPY WITH PROPOFOL N/A 06/26/2019   Procedure: COLONOSCOPY WITH PROPOFOL;  Surgeon: [name], MD;  Location[facility] ENDOSCOPY;  Service: Gastroenterology;  Laterality: N/A;   PORTA CATH INSERTION N/A 06/25/2019   Procedure: PORTA CATH INSERTION;  Surgeon: Algernon Huxley, MD;  Location: [place] CV LAB;  Service: Cardiovascular;  Laterality: N/A;    HEMATOLOGY/ONCOLOGY HISTORY:  Oncology History  Colon cancer metastasized to  intra-abdominal lymph node ([place])  06/19/2019 Cancer Staging   Staging form: Colon and Rectum, AJCC 8th Edition - Clinical stage from 06/19/2019: Stage Unknown (cTX, cNX, pM1) - Signed by Sindy Guadeloupe, MD on 06/20/2019   06/20/2019 Initial Diagnosis   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 ([place])   07/02/2019 -  Chemotherapy   The patient had dexamethasone (DECADRON) 4 MG tablet, 8 mg, Oral, Daily, 1 of 1 cycle, Start date: 07/23/2019, End date: -- palonosetron (ALOXI) injection 0.25 mg, 0.25 mg, Intravenous,  Once, 21 of 21 cycles Administration: 0.25 mg (07/02/2019), 0.25 mg (07/16/2019), 0.25 mg (07/30/2019), 0.25 mg (08/13/2019), 0.25 mg (08/27/2019), 0.25 mg (09/10/2019), 0.25 mg (09/24/2019), 0.25 mg (10/15/2019), 0.25 mg (10/29/2019), 0.25 mg (11/19/2019), 0.25 mg (12/10/2019), 0.25 mg (12/31/2019), 0.25 mg (01/21/2020), 0.25 mg (02/11/2020), 0.25 mg (03/03/2020), 0.25 mg (03/24/2020), 0.25 mg (04/14/2020), 0.25 mg (05/05/2020), 0.25 mg (05/26/2020), 0.25 mg (06/17/2020), 0.25 mg (07/07/2020) pegfilgrastim-cbqv (UDENYCA) injection 6 mg, 6 mg, Subcutaneous, Once, 9 of 10 cycles Administration: 6 mg (01/02/2020), 6 mg (01/23/2020), 6 mg (03/05/2020), 6 mg (03/26/2020), 6 mg (04/16/2020), 6 mg (05/07/2020), 6 mg (05/28/2020), 6 mg (06/19/2020) bevacizumab (AVASTIN) 350 mg in sodium chloride 0.9 % 100 mL chemo infusion, 5 mg/kg = 350 mg, Intravenous,  Once, 5 of 5 cycles Administration: 350 mg (07/02/2019), 350 mg (07/16/2019), 350 mg (07/30/2019), 350 mg (08/13/2019), 350 mg (08/27/2019) leucovorin 700 mg in dextrose 5 % 250 mL infusion, 391 mg/m2 = 716 mg, Intravenous,  Once, 21 of 22 cycles Administration: 700 mg (07/02/2019), 700 mg (07/30/2019), 700  mg (08/13/2019), 700 mg (08/27/2019), 700 mg (09/10/2019), 700 mg (09/24/2019), 700 mg (10/15/2019), 700 mg (10/29/2019), 700 mg (11/19/2019), 700 mg (12/10/2019), 700 mg (12/31/2019), 700 mg (01/21/2020), 700 mg (02/11/2020), 700 mg (03/03/2020), 700 mg (03/24/2020), 700 mg (04/14/2020), 700  mg (05/05/2020), 700 mg (05/26/2020), 700 mg (06/17/2020), 700 mg (07/07/2020) oxaliplatin (ELOXATIN) 150 mg in dextrose 5 % 500 mL chemo infusion, 85 mg/m2 = 150 mg, Intravenous,  Once, 11 of 11 cycles Dose modification: 65 mg/m2 (original dose 85 mg/m2, Cycle 3, Reason: Other (see comments), Comment: patient age and tolerance) Administration: 150 mg (07/02/2019), 150 mg (07/16/2019), 115 mg (07/30/2019), 115 mg (08/13/2019), 115 mg (08/27/2019), 115 mg (09/10/2019), 115 mg (09/24/2019), 115 mg (10/15/2019), 115 mg (10/29/2019), 115 mg (11/19/2019), 115 mg (12/10/2019) fluorouracil (ADRUCIL) chemo injection 700 mg, 400 mg/m2 = 700 mg, Intravenous,  Once, 21 of 22 cycles Administration: 700 mg (07/02/2019), 700 mg (07/16/2019), 700 mg (07/30/2019), 700 mg (08/13/2019), 700 mg (08/27/2019), 700 mg (09/10/2019), 700 mg (09/24/2019), 700 mg (10/15/2019), 700 mg (10/29/2019), 700 mg (11/19/2019), 700 mg (12/10/2019), 700 mg (12/31/2019), 700 mg (01/21/2020), 700 mg (02/11/2020), 700 mg (03/03/2020), 700 mg (03/24/2020), 700 mg (04/14/2020), 700 mg (05/05/2020), 700 mg (05/26/2020), 700 mg (06/17/2020), 700 mg (07/07/2020) fluorouracil (ADRUCIL) 4,300 mg in sodium chloride 0.9 % 64 mL chemo infusion, 2,400 mg/m2 = 4,300 mg, Intravenous, 1 Day/Dose, 21 of 22 cycles Administration: 4,300 mg (07/02/2019), 4,300 mg (07/16/2019), 4,300 mg (07/30/2019), 4,300 mg (08/13/2019), 4,300 mg (08/27/2019), 4,300 mg (09/10/2019), 4,300 mg (09/24/2019), 4,300 mg (10/15/2019), 4,300 mg (10/29/2019), 4,300 mg (11/19/2019), 4,300 mg (12/10/2019), 4,300 mg (12/31/2019), 4,300 mg (01/21/2020), 4,300 mg (02/11/2020), 4,300 mg (03/03/2020), 4,300 mg (03/24/2020), 4,300 mg (04/14/2020), 4,300 mg (05/05/2020), 4,300 mg (05/26/2020), 4,300 mg (06/17/2020), 4,300 mg (07/07/2020) bevacizumab-awwb (MVASI) 350 mg in sodium chloride 0.9 % 100 mL chemo infusion, 5 mg/kg = 350 mg (100 % of original dose 5 mg/kg), Intravenous,  Once, 16 of 17 cycles Dose modification: 5 mg/kg (original dose 5 mg/kg,  Cycle 6), 7.5 mg/kg (original dose 5 mg/kg, Cycle 22, Reason: Other (see comments), Comment: incr dose since pt on chemo q3 weeks) Administration: 350 mg (09/10/2019), 350 mg (10/15/2019), 350 mg (10/29/2019), 350 mg (11/19/2019), 325 mg (12/10/2019), 325 mg (12/31/2019), 325 mg (01/21/2020), 300 mg (02/11/2020), 300 mg (03/03/2020), 300 mg (03/24/2020), 300 mg (04/14/2020), 300 mg (05/05/2020), 300 mg (05/26/2020), 300 mg (06/17/2020), 500 mg (07/07/2020)  for chemotherapy treatment.      ALLERGIES:  has No Known Allergies.  MEDICATIONS:  Current Outpatient Medications  Medication Sig Dispense Refill   aspirin EC 81 MG tablet Take 81 mg by mouth daily.     azelastine (ASTELIN) 0.1 % nasal spray Place 1 spray into both nostrils 1 day or 1 dose.     cetirizine (ZYRTEC) 10 MG tablet Take 1 tablet (10 mg total) by mouth daily. (Patient taking differently: Take 10 mg by mouth daily as needed. ) 30 tablet 0   chlorthalidone (HYGROTON) 25 MG tablet Take 1 tablet by mouth daily.     dexamethasone (DECADRON) 4 MG tablet Take 2 tablets (8 mg total) by mouth daily. Start the day after chemotherapy for 2 days. Take with food. 30 tablet 1   DEXILANT 60 MG capsule Take 1 capsule by mouth 1 day or 1 dose.     DULoxetine (CYMBALTA) 30 MG capsule Take 1 capsule (30 mg total) by mouth 2 (two) times daily. 60 capsule 1   fluticasone (FLONASE) 50 MCG/ACT nasal spray Place 2  sprays into both nostrils 1 day or 1 dose.     fluticasone (VERAMYST) 27.5 MCG/SPRAY nasal spray Place 2 sprays into the nose daily.     lidocaine-prilocaine (EMLA) cream Apply to affected area once 30 g 3   magic mouthwash w/lidocaine SOLN Take 5-10 mLs by mouth 4 (four) times daily as needed for mouth pain. (Patient not taking: Reported on 04/14/2020) 480 mL 3   montelukast (SINGULAIR) 10 MG tablet Take 10 mg by mouth at bedtime.     nicotine (NICODERM CQ - DOSED IN MG/24 HOURS) 21 mg/24hr patch Place 1 patch onto the skin 1 day or 1 dose.      ondansetron (ZOFRAN) 8 MG tablet Take 1 tablet (8 mg total) by mouth 2 (two) times daily as needed for refractory nausea / vomiting. Start on day 3 after chemotherapy. (Patient not taking: Reported on 05/26/2020) 30 tablet 1   pantoprazole (PROTONIX) 40 MG tablet Take 40 mg by mouth daily.     potassium chloride SA (K-DUR) 20 MEQ tablet Take 1 tablet (20 mEq total) by mouth daily. (Patient not taking: Reported on 04/14/2020) 14 tablet 0   pregabalin (LYRICA) 75 MG capsule Take 1 capsule (75 mg total) by mouth 2 (two) times daily. 60 capsule 2   prochlorperazine (COMPAZINE) 10 MG tablet Take 1 tablet (10 mg total) by mouth every 6 (six) hours as needed (Nausea or vomiting). (Patient not taking: Reported on 04/14/2020) 30 tablet 1   simvastatin (ZOCOR) 20 MG tablet Take 20 mg by mouth daily.     sucralfate (CARAFATE) 1 g tablet Take 1 g by mouth 4 (four) times daily.     SYMBICORT 80-4.5 MCG/ACT inhaler Inhale 2 puffs into the lungs 1 day or 1 dose.     tamsulosin (FLOMAX) 0.4 MG CAPS capsule Take 0.4 mg by mouth.     No current facility-administered medications for this visit.   Facility-Administered Medications Ordered in Other Visits  Medication Dose Route Frequency Provider Last Rate Last Admin   sodium chloride flush (NS) 0.9 % injection 10 mL  10 mL Intracatheter PRN Sindy Guadeloupe, MD   10 mL at 08/15/19 1405    VITAL SIGNS: There were no vitals taken for this visit. There were no vitals filed for this visit.  Estimated body mass index is 23.05 kg/m as calculated from the following:   Height as of 03/03/20: 5\' 7"  (1.702 m).   Weight as of an earlier encounter on 07/07/20: 147 lb 3.2 oz (66.8 kg).  LABS: CBC:    Component Value Date/Time   WBC 13.0 (H) 07/07/2020 0757   HGB 14.7 07/07/2020 0757   HGB 16.1 08/18/2012 1321   HCT 42.7 07/07/2020 0757   HCT 45.4 08/18/2012 1321   PLT 159 07/07/2020 0757   PLT 192 08/18/2012 1321   MCV 93.4 07/07/2020 0757   MCV 88 08/18/2012  1321   NEUTROABS 9.1 (H) 07/07/2020 0757   LYMPHSABS 2.5 07/07/2020 0757   MONOABS 1.0 07/07/2020 0757   EOSABS 0.2 07/07/2020 0757   BASOSABS 0.1 07/07/2020 0757   Comprehensive Metabolic Panel:    Component Value Date/Time   NA 135 07/07/2020 0757   NA 139 08/18/2012 1321   K 4.0 07/07/2020 0757   K 3.7 08/18/2012 1321   CL 101 07/07/2020 0757   CL 104 08/18/2012 1321   CO2 25 07/07/2020 0757   CO2 27 08/18/2012 1321   BUN 25 (H) 07/07/2020 0757   BUN 18 08/18/2012 1321  CREATININE 0.89 07/07/2020 0757   CREATININE 0.82 08/18/2012 1321   GLUCOSE 98 07/07/2020 0757   GLUCOSE 68 08/18/2012 1321   CALCIUM 9.1 07/07/2020 0757   CALCIUM 9.0 08/18/2012 1321   AST 28 07/07/2020 0757   AST 36 08/18/2012 1321   ALT 25 07/07/2020 0757   ALT 51 08/18/2012 1321   ALKPHOS 101 07/07/2020 0757   ALKPHOS 88 08/18/2012 1321   BILITOT 0.8 07/07/2020 0757   BILITOT 0.4 08/18/2012 1321   PROT 7.6 07/07/2020 0757   PROT 8.0 08/18/2012 1321   ALBUMIN 4.1 07/07/2020 0757   ALBUMIN 4.1 08/18/2012 1321    RADIOGRAPHIC STUDIES: No results found.  PERFORMANCE STATUS (ECOG) : 1 - Symptomatic but completely ambulatory  Review of Systems Unless otherwise noted, a complete review of systems is negative.  Physical Exam General: NAD Pulmonary: unlabored Extremities: no edema, no joint deformities Skin: no rashes Neurological: Weakness but otherwise nonfocal  IMPRESSION: Patient was seen in infusion.  Patient reports he is doing well. He denies any significant changes or concerns today. He has chronic LE peripheral neuropathy but he says this is stable. He denies falls. He is taking Cymbalta. Accupuncture has been discussed previously by Dr. [name].   Patient reports that he is tolerating treatments well. He remains functionally independent at home. His oral intake and weights are stable.   PLAN: -Continue current scope of treatment -Continue Cymbalta -Follow up My Chart visit in 1-2  months   Patient expressed understanding and was in agreement with this plan. He also understands that He can call the clinic at any time with any questions, concerns, or complaints.     Time Total: 15 minutes  Visit consisted of counseling and education dealing with the complex and emotionally intense issues of symptom management and palliative care in the setting of serious and potentially life-threatening illness.Greater than 50%  of this time was spent counseling and coordinating care related to the above assessment and plan.  Signed by: Altha Harm, PhD, NP-C

## 2020-07-08 LAB — CEA: CEA: 4.9 ng/mL — ABNORMAL HIGH (ref 0.0–4.7)

## 2020-07-09 ENCOUNTER — Inpatient Hospital Stay: Payer: Medicare HMO

## 2020-07-09 ENCOUNTER — Other Ambulatory Visit: Payer: Self-pay

## 2020-07-09 VITALS — BP 109/67 | HR 73 | Temp 97.0°F | Resp 20

## 2020-07-09 DIAGNOSIS — Z5112 Encounter for antineoplastic immunotherapy: Secondary | ICD-10-CM | POA: Diagnosis not present

## 2020-07-09 DIAGNOSIS — C772 Secondary and unspecified malignant neoplasm of intra-abdominal lymph nodes: Secondary | ICD-10-CM

## 2020-07-09 DIAGNOSIS — C189 Malignant neoplasm of colon, unspecified: Secondary | ICD-10-CM

## 2020-07-09 MED ORDER — HEPARIN SOD (PORK) LOCK FLUSH 100 UNIT/ML IV SOLN
INTRAVENOUS | Status: AC
  Filled 2020-07-09: qty 5

## 2020-07-09 MED ORDER — PEGFILGRASTIM-CBQV 6 MG/0.6ML ~~LOC~~ SOSY
6.0000 mg | PREFILLED_SYRINGE | Freq: Once | SUBCUTANEOUS | Status: AC
  Administered 2020-07-09: 6 mg via SUBCUTANEOUS
  Filled 2020-07-09: qty 0.6

## 2020-07-09 MED ORDER — HEPARIN SOD (PORK) LOCK FLUSH 100 UNIT/ML IV SOLN
500.0000 [IU] | Freq: Once | INTRAVENOUS | Status: AC | PRN
  Administered 2020-07-09: 500 [IU]
  Filled 2020-07-09: qty 5

## 2020-07-09 MED ORDER — SODIUM CHLORIDE 0.9% FLUSH
10.0000 mL | INTRAVENOUS | Status: DC | PRN
  Administered 2020-07-09: 10 mL
  Filled 2020-07-09: qty 10

## 2020-07-28 ENCOUNTER — Telehealth: Payer: Self-pay | Admitting: *Deleted

## 2020-07-28 ENCOUNTER — Inpatient Hospital Stay (HOSPITAL_BASED_OUTPATIENT_CLINIC_OR_DEPARTMENT_OTHER): Payer: Medicare HMO | Admitting: Oncology

## 2020-07-28 ENCOUNTER — Inpatient Hospital Stay: Payer: Medicare HMO | Attending: Oncology

## 2020-07-28 ENCOUNTER — Other Ambulatory Visit: Payer: Self-pay

## 2020-07-28 ENCOUNTER — Inpatient Hospital Stay: Payer: Medicare HMO

## 2020-07-28 ENCOUNTER — Encounter: Payer: Self-pay | Admitting: Oncology

## 2020-07-28 VITALS — BP 127/65 | HR 73 | Temp 95.6°F | Resp 16 | Wt 148.0 lb

## 2020-07-28 DIAGNOSIS — C187 Malignant neoplasm of sigmoid colon: Secondary | ICD-10-CM | POA: Diagnosis present

## 2020-07-28 DIAGNOSIS — C772 Secondary and unspecified malignant neoplasm of intra-abdominal lymph nodes: Secondary | ICD-10-CM | POA: Diagnosis not present

## 2020-07-28 DIAGNOSIS — T451X5A Adverse effect of antineoplastic and immunosuppressive drugs, initial encounter: Secondary | ICD-10-CM

## 2020-07-28 DIAGNOSIS — Z5189 Encounter for other specified aftercare: Secondary | ICD-10-CM | POA: Diagnosis not present

## 2020-07-28 DIAGNOSIS — G620 Drug-induced polyneuropathy: Secondary | ICD-10-CM | POA: Diagnosis not present

## 2020-07-28 DIAGNOSIS — Z5111 Encounter for antineoplastic chemotherapy: Secondary | ICD-10-CM | POA: Diagnosis present

## 2020-07-28 DIAGNOSIS — C189 Malignant neoplasm of colon, unspecified: Secondary | ICD-10-CM

## 2020-07-28 DIAGNOSIS — Z5112 Encounter for antineoplastic immunotherapy: Secondary | ICD-10-CM | POA: Diagnosis present

## 2020-07-28 DIAGNOSIS — C786 Secondary malignant neoplasm of retroperitoneum and peritoneum: Secondary | ICD-10-CM | POA: Insufficient documentation

## 2020-07-28 DIAGNOSIS — Z452 Encounter for adjustment and management of vascular access device: Secondary | ICD-10-CM | POA: Insufficient documentation

## 2020-07-28 LAB — CBC WITH DIFFERENTIAL/PLATELET
Abs Immature Granulocytes: 0.07 10*3/uL (ref 0.00–0.07)
Basophils Absolute: 0.1 10*3/uL (ref 0.0–0.1)
Basophils Relative: 1 %
Eosinophils Absolute: 0.2 10*3/uL (ref 0.0–0.5)
Eosinophils Relative: 1 %
HCT: 40.8 % (ref 39.0–52.0)
Hemoglobin: 14.2 g/dL (ref 13.0–17.0)
Immature Granulocytes: 1 %
Lymphocytes Relative: 19 %
Lymphs Abs: 2 10*3/uL (ref 0.7–4.0)
MCH: 32.4 pg (ref 26.0–34.0)
MCHC: 34.8 g/dL (ref 30.0–36.0)
MCV: 93.2 fL (ref 80.0–100.0)
Monocytes Absolute: 0.9 10*3/uL (ref 0.1–1.0)
Monocytes Relative: 8 %
Neutro Abs: 7.5 10*3/uL (ref 1.7–7.7)
Neutrophils Relative %: 70 %
Platelets: 145 10*3/uL — ABNORMAL LOW (ref 150–400)
RBC: 4.38 MIL/uL (ref 4.22–5.81)
RDW: 15.5 % (ref 11.5–15.5)
WBC: 10.7 10*3/uL — ABNORMAL HIGH (ref 4.0–10.5)
nRBC: 0 % (ref 0.0–0.2)

## 2020-07-28 LAB — COMPREHENSIVE METABOLIC PANEL
ALT: 27 U/L (ref 0–44)
AST: 33 U/L (ref 15–41)
Albumin: 3.9 g/dL (ref 3.5–5.0)
Alkaline Phosphatase: 89 U/L (ref 38–126)
Anion gap: 7 (ref 5–15)
BUN: 29 mg/dL — ABNORMAL HIGH (ref 8–23)
CO2: 24 mmol/L (ref 22–32)
Calcium: 9 mg/dL (ref 8.9–10.3)
Chloride: 107 mmol/L (ref 98–111)
Creatinine, Ser: 0.91 mg/dL (ref 0.61–1.24)
GFR calc Af Amer: >60 mL/min (ref >60)
GFR calc non Af Amer: >60 mL/min (ref >60)
Glucose, Bld: 97 mg/dL (ref 70–99)
Potassium: 4.2 mmol/L (ref 3.5–5.1)
Sodium: 138 mmol/L (ref 135–145)
Total Bilirubin: 0.7 mg/dL (ref 0.3–1.2)
Total Protein: 7.7 g/dL (ref 6.5–8.1)

## 2020-07-28 LAB — PROTEIN, URINE, RANDOM: Total Protein, Urine: 18 mg/dL

## 2020-07-28 MED ORDER — SODIUM CHLORIDE 0.9 % IV SOLN
7.5000 mg/kg | Freq: Once | INTRAVENOUS | Status: AC
  Administered 2020-07-28: 500 mg via INTRAVENOUS
  Filled 2020-07-28: qty 16

## 2020-07-28 MED ORDER — DEXAMETHASONE SODIUM PHOSPHATE 10 MG/ML IJ SOLN
10.0000 mg | Freq: Once | INTRAMUSCULAR | Status: AC
  Administered 2020-07-28: 10 mg via INTRAVENOUS
  Filled 2020-07-28: qty 1

## 2020-07-28 MED ORDER — HEPARIN SOD (PORK) LOCK FLUSH 100 UNIT/ML IV SOLN
500.0000 [IU] | Freq: Once | INTRAVENOUS | Status: DC
  Filled 2020-07-28: qty 5

## 2020-07-28 MED ORDER — SODIUM CHLORIDE 0.9% FLUSH
10.0000 mL | Freq: Once | INTRAVENOUS | Status: DC
  Filled 2020-07-28: qty 10

## 2020-07-28 MED ORDER — DEXAMETHASONE SODIUM PHOSPHATE 10 MG/ML IJ SOLN
INTRAMUSCULAR | Status: AC
  Filled 2020-07-28: qty 1

## 2020-07-28 MED ORDER — SODIUM CHLORIDE 0.9 % IV SOLN
2400.0000 mg/m2 | INTRAVENOUS | Status: DC
  Administered 2020-07-28: 4300 mg via INTRAVENOUS
  Filled 2020-07-28: qty 86

## 2020-07-28 MED ORDER — SODIUM CHLORIDE 0.9 % IV SOLN
INTRAVENOUS | Status: DC
  Filled 2020-07-28: qty 250

## 2020-07-28 MED ORDER — LEUCOVORIN CALCIUM INJECTION 350 MG
700.0000 mg | Freq: Once | INTRAVENOUS | Status: AC
  Administered 2020-07-28: 700 mg via INTRAVENOUS
  Filled 2020-07-28: qty 25

## 2020-07-28 MED ORDER — FLUOROURACIL CHEMO INJECTION 2.5 GM/50ML
400.0000 mg/m2 | Freq: Once | INTRAVENOUS | Status: AC
  Administered 2020-07-28: 700 mg via INTRAVENOUS
  Filled 2020-07-28: qty 14

## 2020-07-28 NOTE — Telephone Encounter (Signed)
Pt has concern about his port. It is painful when he touches it in 1 spot. It flushed well, no swelling, give blood draw back. Pt states that he did not fall or hurt it in anyway. IR MD and [name] feel that pt should call office if he sees swelling, or redness or hot to touch in the spot that hurts. He is agreeable to this

## 2020-07-28 NOTE — Progress Notes (Signed)
Hematology/Oncology Consult note [facility]  [phone] [phone]  Patient Care Team: Jodi Marble, MD as PCP - General (Internal Medicine) Clent Jacks, RN as Oncology Nurse Navigator   Name of the patient: Eric Simon  [id number]  Jul 25, 1951   Date of visit: 07/28/20  Diagnosis- metastatic colon cancer with lymph node metastases K-ras/BRAF wild-type  Chief complaint/ Reason for visit-on treatment assessment prior to cycle 22 of 5-FU Mvasi chemotherapy  Heme/Onc history: Patient is a69yrold male with >40 pack year history of smoking. He currently smokes 0.5ppd. he presented to the ER with symptoms of left sided chest pain and left arm pain. Troponin was negative ekg was unremarkable. Ct chest showed no PE. He was incidentally noted to have mediastinal and retrocrural adenopathy and a 2.1X2.3X4.4 cm retroaortic soft tissue lesion in the posterior left chest. 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 Low-grade activity in the left lateral fifth and sixth ribs associated with nondisplaced fractures likely reflecting healing response problem malignancy.  Patient underwent CT-guided biopsy of the retroperitoneal lymph node pathology showed metastatic adenocarcinoma compatible with colorectal origin. CK7 negative. CK20 positive. CDX 2+. TTF-1 negative. PSA negative. This pattern of immunoreactivity supports the above diagnosis. Patient underwent colonoscopy which showed sigmoid mass that was consistent with adenocarcinoma. RASpanel testing showed that he was wild-type for both K-ras and BRAF   Interval history-reports occasional sharp pain at the port site.  Today when his port was getting accessed port site was sensitive to needle insertion.  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  ECOG PS- 1 Pain scale- 0   Review of systems- Review of Systems  Constitutional: Negative for chills, fever, malaise/fatigue and weight loss.  HENT: Negative for congestion, ear discharge and nosebleeds.   Eyes: Negative for blurred vision.  Respiratory: Negative for cough, hemoptysis, sputum production, shortness of breath and wheezing.   Cardiovascular: Negative for chest pain, palpitations, orthopnea and claudication.  Gastrointestinal: Negative for abdominal pain, blood in stool, constipation, diarrhea, heartburn, melena, nausea and vomiting.  Genitourinary: Negative for dysuria, flank pain, frequency, hematuria and urgency.  Musculoskeletal: Negative for back pain, joint pain and myalgias.  Skin: Negative for rash.  Neurological: Positive for sensory change (peripheral neuropathy). Negative for dizziness, tingling, focal weakness, seizures, weakness and headaches.  Endo/Heme/Allergies: Does not bruise/bleed easily.  Psychiatric/Behavioral: Negative for depression and suicidal ideas. The patient does not have insomnia.        No Known Allergies   Past Medical History:  Diagnosis Date   [place] cancer ([facility]    COPD (chronic obstructive pulmonary disease) ([facility]    Hyperlipidemia      Past Surgical History:  Procedure Laterality Date   COLONOSCOPY WITH PROPOFOL N/A 06/26/2019   Procedure: COLONOSCOPY WITH PROPOFOL;  Surgeon: AJonathon Bellows MD;  [facility];  Service: Gastroenterology;  Laterality: N/A;   PORTA CATH INSERTION N/A 06/25/2019   Procedure: PORTA CATH INSERTION;  Surgeon: DAlgernon Huxley MD;  [facility];  Service: Cardiovascular;  Laterality: N/A;    Social History   Socioeconomic History   Marital status: Single    Spouse name: Not on file   Number of children: Not on file   Years of education: Not on file   Highest education level: Not on file  Occupational History  Not on file   Tobacco Use   Smoking status: Current Every Day Smoker    Packs/day: 0.50    Years: 40.00    Pack years: 20.00    Types: Cigarettes   Smokeless tobacco: Never Used   Tobacco comment: smoking 40 years  Vaping Use   Vaping Use: Never used  Substance and Sexual Activity   Alcohol use: No   Drug use: No   Sexual activity: Not Currently  Other Topics Concern   Not on file  Social History Narrative   Not on file   Social Determinants of Health   Financial Resource Strain:    Difficulty of Paying Living Expenses:   Food Insecurity:    Worried About Charity fundraiser in the Last Year:    Arboriculturist in the Last Year:   Transportation Needs:    Film/video editor (Medical):    Lack of Transportation (Non-Medical):   Physical Activity:    Days of Exercise per Week:    Minutes of Exercise per Session:   Stress:    Feeling of Stress :   Social Connections:    Frequency of Communication with Friends and Family:    Frequency of Social Gatherings with Friends and Family:    Attends Religious Services:    Active Member of Clubs or Organizations:    Attends Music therapist:    Marital Status:   Intimate Partner Violence:    Fear of Current or Ex-Partner:    Emotionally Abused:    Physically Abused:    Sexually Abused:     Family History  Problem Relation Age of Onset   Brain cancer Father      Current Outpatient Medications:    aspirin EC 81 MG tablet, Take 81 mg by mouth daily., Disp: , Rfl:    azelastine (ASTELIN) 0.1 % nasal spray, Place 1 spray into both nostrils 1 day or 1 dose., Disp: , Rfl:    cetirizine (ZYRTEC) 10 MG tablet, Take 1 tablet (10 mg total) by mouth daily. (Patient taking differently: Take 10 mg by mouth daily as needed. ), Disp: 30 tablet, Rfl: 0   chlorthalidone (HYGROTON) 25 MG tablet, Take 1 tablet by mouth daily., Disp: , Rfl:    dexamethasone (DECADRON) 4 MG tablet, Take 2 tablets (8 mg  total) by mouth daily. Start the day after chemotherapy for 2 days. Take with food., Disp: 30 tablet, Rfl: 1   DEXILANT 60 MG capsule, Take 1 capsule by mouth 1 day or 1 dose., Disp: , Rfl:    DULoxetine (CYMBALTA) 30 MG capsule, Take 1 capsule (30 mg total) by mouth 2 (two) times daily., Disp: 60 capsule, Rfl: 1   fluticasone (FLONASE) 50 MCG/ACT nasal spray, Place 2 sprays into both nostrils 1 day or 1 dose., Disp: , Rfl:    fluticasone (VERAMYST) 27.5 MCG/SPRAY nasal spray, Place 2 sprays into the nose daily., Disp: , Rfl:    lidocaine-prilocaine (EMLA) cream, Apply to affected area once, Disp: 30 g, Rfl: 3   magic mouthwash w/lidocaine SOLN, Take 5-10 mLs by mouth 4 (four) times daily as needed for mouth pain. (Patient not taking: Reported on 04/14/2020), Disp: 480 mL, Rfl: 3   montelukast (SINGULAIR) 10 MG tablet, Take 10 mg by mouth at bedtime., Disp: , Rfl:    nicotine (NICODERM CQ - DOSED IN MG/24 HOURS) 21 mg/24hr patch, Place 1 patch onto the skin 1 day or 1 dose., Disp: , Rfl:  ondansetron (ZOFRAN) 8 MG tablet, Take 1 tablet (8 mg total) by mouth 2 (two) times daily as needed for refractory nausea / vomiting. Start on day 3 after chemotherapy. (Patient not taking: Reported on 05/26/2020), Disp: 30 tablet, Rfl: 1   pantoprazole (PROTONIX) 40 MG tablet, Take 40 mg by mouth daily., Disp: , Rfl:    potassium chloride SA (K-DUR) 20 MEQ tablet, Take 1 tablet (20 mEq total) by mouth daily. (Patient not taking: Reported on 04/14/2020), Disp: 14 tablet, Rfl: 0   pregabalin (LYRICA) 75 MG capsule, Take 1 capsule (75 mg total) by mouth 2 (two) times daily., Disp: 60 capsule, Rfl: 2   prochlorperazine (COMPAZINE) 10 MG tablet, Take 1 tablet (10 mg total) by mouth every 6 (six) hours as needed (Nausea or vomiting). (Patient not taking: Reported on 04/14/2020), Disp: 30 tablet, Rfl: 1   simvastatin (ZOCOR) 20 MG tablet, Take 20 mg by mouth daily., Disp: , Rfl:    sucralfate (CARAFATE) 1 g  tablet, Take 1 g by mouth 4 (four) times daily., Disp: , Rfl:    SYMBICORT 80-4.5 MCG/ACT inhaler, Inhale 2 puffs into the lungs 1 day or 1 dose., Disp: , Rfl:    tamsulosin (FLOMAX) 0.4 MG CAPS capsule, Take 0.4 mg by mouth., Disp: , Rfl:  No current facility-administered medications for this visit.  Facility-Administered Medications Ordered in Other Visits:    heparin lock flush 100 unit/mL, 500 Units, Intravenous, Once, Sindy Guadeloupe, MD   sodium chloride flush (NS) 0.9 % injection 10 mL, 10 mL, Intracatheter, PRN, Sindy Guadeloupe, MD, 10 mL at 08/15/19 1405   sodium chloride flush (NS) 0.9 % injection 10 mL, 10 mL, Intravenous, Once, Sindy Guadeloupe, MD  Physical exam:  Vitals:   07/28/20 0908  BP: 127/65  Pulse: 73  Resp: 16  Temp: (!) 95.6 F (35.3 C)  TempSrc: Tympanic  SpO2: 99%  Weight: 148 lb (67.1 kg)   Physical Exam Cardiovascular:     Rate and Rhythm: Normal rate and regular rhythm.     Heart sounds: Normal heart sounds.  Pulmonary:     Effort: Pulmonary effort is normal.     Breath sounds: Normal breath sounds.  Abdominal:     General: Bowel sounds are normal.     Palpations: Abdomen is soft.  Skin:    General: Skin is warm and dry.  Neurological:     Mental Status: He is alert and oriented to person, place, and time.      CMP Latest Ref Rng & Units 07/07/2020  Glucose 70 - 99 mg/dL 98  BUN 8 - 23 mg/dL 25(H)  Creatinine 0.61 - 1.24 mg/dL 0.89  Sodium 135 - 145 mmol/L 135  Potassium 3.5 - 5.1 mmol/L 4.0  Chloride 98 - 111 mmol/L 101  CO2 22 - 32 mmol/L 25  Calcium 8.9 - 10.3 mg/dL 9.1  Total Protein 6.5 - 8.1 g/dL 7.6  Total Bilirubin 0.3 - 1.2 mg/dL 0.8  Alkaline Phos 38 - 126 U/L 101  AST 15 - 41 U/L 28  ALT 0 - 44 U/L 25   CBC Latest Ref Rng & Units 07/07/2020  WBC 4.0 - 10.5 K/uL 13.0(H)  Hemoglobin 13.0 - 17.0 g/dL 14.7  Hematocrit 39 - 52 % 42.7  Platelets 150 - 400 K/uL 159      Assessment and plan- Patient is a 69 y.o. male  with  metastatic colon cancer TX NX M1 with generalized lymphadenopathy K-ras/beta wild type.  He  is here for on treatment assessment prior to cycle 22 of 5-FU Mvasi chemotherapy  Counts okay to proceed with cycle 22 of 5-FU Mvasi chemotherapy today.  Pump DC on day 3 and he receives [place].  I will see him back in 3 weeks for cycle 23.  I will plan to check repeat scans after 23 cycles.  Urine protein is currently pending.  Blood pressures have remained stable on Mvasi.  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  No overt inflammation or wound dehiscence along the scar line.  We will get in touch with interventional radiology and see if any intervention is warranted given the sensitivity at the port site.  Port is functioning well with good blood draw and also flushes well.  We briefly discussed her dose of Covid vaccine.  He would be eligible given his immunocompromise status.  He received the second dose in April 2021.  He would like to wait on that for now and think about it in about a month's time   Visit Diagnosis 1. Encounter for antineoplastic chemotherapy   2. 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 ([place])   3. Chemotherapy-induced peripheral neuropathy ([facility])      Dr. Randa Evens, MD, MPH [facility] [phone] 07/28/2020 9:22 AM

## 2020-07-28 NOTE — Progress Notes (Signed)
Per Dr. Janese Simon okay to proceed with treatment with urine Protein of 16 from 07/07/2020.

## 2020-07-30 ENCOUNTER — Other Ambulatory Visit: Payer: Self-pay | Admitting: Oncology

## 2020-07-30 ENCOUNTER — Other Ambulatory Visit: Payer: Self-pay

## 2020-07-30 ENCOUNTER — Inpatient Hospital Stay: Payer: Medicare HMO

## 2020-07-30 DIAGNOSIS — Z5112 Encounter for antineoplastic immunotherapy: Secondary | ICD-10-CM | POA: Diagnosis not present

## 2020-07-30 DIAGNOSIS — C772 Secondary and unspecified malignant neoplasm of intra-abdominal lymph nodes: Secondary | ICD-10-CM

## 2020-07-30 DIAGNOSIS — C189 Malignant neoplasm of colon, unspecified: Secondary | ICD-10-CM

## 2020-07-30 MED ORDER — SODIUM CHLORIDE 0.9% FLUSH
10.0000 mL | Freq: Once | INTRAVENOUS | Status: AC
  Administered 2020-07-30: 10 mL via INTRAVENOUS
  Filled 2020-07-30: qty 10

## 2020-07-30 MED ORDER — HEPARIN SOD (PORK) LOCK FLUSH 100 UNIT/ML IV SOLN
500.0000 [IU] | Freq: Once | INTRAVENOUS | Status: AC
  Administered 2020-07-30: 500 [IU] via INTRAVENOUS
  Filled 2020-07-30: qty 5

## 2020-07-30 MED ORDER — PEGFILGRASTIM-CBQV 6 MG/0.6ML ~~LOC~~ SOSY
6.0000 mg | PREFILLED_SYRINGE | Freq: Once | SUBCUTANEOUS | Status: AC
  Administered 2020-07-30: 6 mg via SUBCUTANEOUS
  Filled 2020-07-30: qty 0.6

## 2020-07-30 MED ORDER — HEPARIN SOD (PORK) LOCK FLUSH 100 UNIT/ML IV SOLN
INTRAVENOUS | Status: AC
  Filled 2020-07-30: qty 5

## 2020-07-30 NOTE — Progress Notes (Signed)
Patient here for pump d/c today. Upon arrival patient informed RN that his port is really sore, hurts to touch it. Patient flinched when needle was removed. No swelling or redness. Flushes easily and has blood return. Dr [name] and Moishe Spice made aware

## 2020-08-01 ENCOUNTER — Other Ambulatory Visit: Payer: Self-pay | Admitting: *Deleted

## 2020-08-01 ENCOUNTER — Telehealth: Payer: Self-pay | Admitting: *Deleted

## 2020-08-01 MED ORDER — CEPHALEXIN 500 MG PO CAPS: 500.0000 mg | ORAL_CAPSULE | Freq: Four times a day (QID) | ORAL | 0 refills | Status: DC

## 2020-08-01 MED ORDER — OXYCODONE HCL 5 MG PO TABS: 5.0000 mg | ORAL_TABLET | Freq: Four times a day (QID) | ORAL | 0 refills | Status: DC | PRN

## 2020-08-01 NOTE — Telephone Encounter (Signed)
Called pt and let him know to start atb and take pain med if needs it and appt with vascular 8/24 at 11 am. Pt aware of all of it

## 2020-08-01 NOTE — Telephone Encounter (Signed)
Patient called reporting that the pain at his port site has gotten worse, cannot touch area without shooting pain going over chest area around port and that his entire chest area is now hurting. Denies redness or heat to area. He is asking what to do.

## 2020-08-01 NOTE — Telephone Encounter (Signed)
Called pt and let him know that dr [name] wants him to have a appt with vascular. He is sore very much if you touch it. It is now hurting in chest are on that side and at his shoulder. I told pt that dr [name] wants him to start atb as well as pain med if he needs it and an appt for him to [facility] and the appt is 8/24 11 am and arrive 15 min early. He is agreeable to the above plan

## 2020-08-01 NOTE — Telephone Encounter (Signed)
Give him a prescription for keflex and get him seen by surgery/ vascular who put his port in. Also prn oxycodone for pain

## 2020-08-05 ENCOUNTER — Other Ambulatory Visit: Payer: Self-pay

## 2020-08-05 ENCOUNTER — Ambulatory Visit (INDEPENDENT_AMBULATORY_CARE_PROVIDER_SITE_OTHER): Payer: Medicare HMO | Admitting: Vascular Surgery

## 2020-08-05 ENCOUNTER — Encounter (INDEPENDENT_AMBULATORY_CARE_PROVIDER_SITE_OTHER): Payer: Self-pay | Admitting: Vascular Surgery

## 2020-08-05 ENCOUNTER — Telehealth (INDEPENDENT_AMBULATORY_CARE_PROVIDER_SITE_OTHER): Payer: Self-pay

## 2020-08-05 VITALS — BP 150/74 | HR 74 | Ht 67.0 in | Wt 146.0 lb

## 2020-08-05 DIAGNOSIS — T829XXA Unspecified complication of cardiac and vascular prosthetic device, implant and graft, initial encounter: Secondary | ICD-10-CM

## 2020-08-05 DIAGNOSIS — C772 Secondary and unspecified malignant neoplasm of intra-abdominal lymph nodes: Secondary | ICD-10-CM

## 2020-08-05 DIAGNOSIS — C189 Malignant neoplasm of colon, unspecified: Secondary | ICD-10-CM | POA: Diagnosis not present

## 2020-08-05 DIAGNOSIS — I83811 Varicose veins of right lower extremities with pain: Secondary | ICD-10-CM

## 2020-08-05 NOTE — Progress Notes (Signed)
MRN : [id number]  Eric Simon is a 69 y.o. (1951/08/29) male who presents with chief complaint of  Chief Complaint  Patient presents with   Follow-up    Cancer center req. port placed and is now red and hurting  .  History of Present Illness: Patient returns today in follow up of port that was placed a little over a year ago.  This was working fine with no issues until about a month ago when he had a infusion treatment for 3 consecutive days at which time is extremely painful.  This has left a small scab/ulcer overlying the port in the area where the needle was.  It is unclear if he may have had some extravasation of the infusion into the soft tissue or a difficult access at that time, but since then, his port is extremely painful and tender to the touch.  He does not have a lot of soft tissue and the skin is quite tense overlying the port at a baseline.  No erythema or drainage.  No fever or chills.  He is still getting ongoing chemotherapy with no scheduled and insight to his knowledge so he still has need for a port.  Current Outpatient Medications  Medication Sig Dispense Refill   aspirin EC 81 MG tablet Take 81 mg by mouth daily.     azelastine (ASTELIN) 0.1 % nasal spray Place 1 spray into both nostrils 1 day or 1 dose.     cephALEXin (KEFLEX) 500 MG capsule Take 1 capsule (500 mg total) by mouth 4 (four) times daily. 28 capsule 0   cetirizine (ZYRTEC) 10 MG tablet Take 1 tablet (10 mg total) by mouth daily. (Patient taking differently: Take 10 mg by mouth daily as needed. ) 30 tablet 0   chlorthalidone (HYGROTON) 25 MG tablet Take 1 tablet by mouth daily.     dexamethasone (DECADRON) 4 MG tablet Take 2 tablets (8 mg total) by mouth daily. Start the day after chemotherapy for 2 days. Take with food. 30 tablet 1   DEXILANT 60 MG capsule Take 1 capsule by mouth 1 day or 1 dose.     DULoxetine (CYMBALTA) 30 MG capsule Take 1 capsule (30 mg total) by mouth 2 (two) times  daily. 60 capsule 1   fluticasone (FLONASE) 50 MCG/ACT nasal spray Place 2 sprays into both nostrils 1 day or 1 dose.     fluticasone (VERAMYST) 27.5 MCG/SPRAY nasal spray Place 2 sprays into the nose daily.     lidocaine-prilocaine (EMLA) cream Apply to affected area once 30 g 3   magic mouthwash w/lidocaine SOLN Take 5-10 mLs by mouth 4 (four) times daily as needed for mouth pain. 480 mL 3   montelukast (SINGULAIR) 10 MG tablet Take 10 mg by mouth at bedtime.     nicotine (NICODERM CQ - DOSED IN MG/24 HOURS) 21 mg/24hr patch Place 1 patch onto the skin 1 day or 1 dose.     ondansetron (ZOFRAN) 8 MG tablet Take 1 tablet (8 mg total) by mouth 2 (two) times daily as needed for refractory nausea / vomiting. Start on day 3 after chemotherapy. 30 tablet 1   oxyCODONE (OXY IR/ROXICODONE) 5 MG immediate release tablet Take 1 tablet (5 mg total) by mouth every 6 (six) hours as needed for severe pain. 30 tablet 0   pantoprazole (PROTONIX) 40 MG tablet Take 40 mg by mouth daily.     potassium chloride SA (K-DUR) 20 MEQ tablet Take 1 tablet (  20 mEq total) by mouth daily. 14 tablet 0   pregabalin (LYRICA) 75 MG capsule Take 1 capsule (75 mg total) by mouth 2 (two) times daily. 60 capsule 2   prochlorperazine (COMPAZINE) 10 MG tablet Take 1 tablet (10 mg total) by mouth every 6 (six) hours as needed (Nausea or vomiting). 30 tablet 1   simvastatin (ZOCOR) 20 MG tablet Take 20 mg by mouth daily.     sucralfate (CARAFATE) 1 g tablet Take 1 g by mouth 4 (four) times daily.     SYMBICORT 80-4.5 MCG/ACT inhaler Inhale 2 puffs into the lungs 1 day or 1 dose.     tamsulosin (FLOMAX) 0.4 MG CAPS capsule Take 0.4 mg by mouth.     No current facility-administered medications for this visit.   Facility-Administered Medications Ordered in Other Visits  Medication Dose Route Frequency Provider Last Rate Last Admin   sodium chloride flush (NS) 0.9 % injection 10 mL  10 mL Intracatheter PRN Sindy Guadeloupe, MD   10 mL at 08/15/19 1405    Past Medical History:  Diagnosis Date   Colon cancer ([place])    COPD (chronic obstructive pulmonary disease) ([place])    Hyperlipidemia     Past Surgical History:  Procedure Laterality Date   COLONOSCOPY WITH PROPOFOL N/A 06/26/2019   Procedure: COLONOSCOPY WITH PROPOFOL;  Surgeon: [name], MD;  Location: [facility] ENDOSCOPY;  Service: Gastroenterology;  Laterality: N/A;   PORTA CATH INSERTION N/A 06/25/2019   Procedure: PORTA CATH INSERTION;  Surgeon: Algernon Huxley, MD;  Location: [facility];  Service: Cardiovascular;  Laterality: N/A;     Social History   Tobacco Use   Smoking status: Current Every Day Smoker    Packs/day: 0.50    Years: 40.00    Pack years: 20.00    Types: Cigarettes   Smokeless tobacco: Never Used   Tobacco comment: smoking 40 years  Vaping Use   Vaping Use: Never used  Substance Use Topics   Alcohol use: No   Drug use: No      Family History  Problem Relation Age of Onset   Brain cancer Father   no bleeding or clotting disorders No aneurysms  No Known Allergies   REVIEW OF SYSTEMS (Negative unless checked)  Constitutional: [] Weight loss  [] Fever  [] Chills Cardiac: [] Chest pain   [] Chest pressure   [] Palpitations   [] Shortness of breath when laying flat   [] Shortness of breath at rest   [] Shortness of breath with exertion. Vascular:  [] Pain in legs with walking   [] Pain in legs at rest   [] Pain in legs when laying flat   [] Claudication   [] Pain in feet when walking  [] Pain in feet at rest  [] Pain in feet when laying flat   [] History of DVT   [] Phlebitis   [] Swelling in legs   [] Varicose veins   [] Non-healing ulcers Pulmonary:   [] Uses home oxygen   [] Productive cough   [] Hemoptysis   [] Wheeze  [x] COPD   [] Asthma Neurologic:  [] Dizziness  [] Blackouts   [] Seizures   [] History of stroke   [] History of TIA  [] Aphasia   [] Temporary blindness   [] Dysphagia   [] Weakness or numbness in arms   [] Weakness  or numbness in legs Musculoskeletal:  [x] Arthritis   [] Joint swelling   [] Joint pain   [] Low back pain Hematologic:  [] Easy bruising  [] Easy bleeding   [] Hypercoagulable state   [] Anemic   Gastrointestinal:  [] Blood in stool   [] Vomiting  blood  [] Gastroesophageal reflux/heartburn   [] Abdominal pain Genitourinary:  [] Chronic kidney disease   [] Difficult urination  [] Frequent urination  [] Burning with urination   [] Hematuria Skin:  [] Rashes   [] Ulcers   [] Wounds Psychological:  [] History of anxiety   []  History of major depression.  Physical Examination  BP (!) 150/74    Pulse 74    Ht 5\' 7"  (1.702 m)    Wt 146 lb (66.2 kg)    BMI 22.87 kg/m  Gen:  WD/WN, NAD Head: [place]/AT, No temporalis wasting. Ear/Nose/Throat: Hearing grossly intact, nares w/o erythema or drainage Eyes: Conjunctiva clear. Sclera non-icteric Neck: Supple.  Trachea midline Pulmonary:  Good air movement, no use of accessory muscles.  Cardiac: RRR, no JVD Vascular: Right-sided Port-A-Cath with somewhat tense skin and very small amount of subcutaneous tissue.  The port does definitely push out on the skin and create some tension.  There is a small ulcer present from the area of his recent access.  No purulent drainage.  Not particularly erythematous. Vessel Right Left  Radial Palpable Palpable           Musculoskeletal: M/S 5/5 throughout.  No deformity or atrophy.  Some varicosities are present particular in the right leg.  Trace lower extremity edema. Neurologic: Sensation grossly intact in extremities.  Symmetrical.  Speech is fluent.  Psychiatric: Judgment intact, Mood & affect appropriate for pt's clinical situation. Dermatologic: No rashes or ulcers noted.  No cellulitis or open wounds.       Labs Recent Results (from the past 2160 hour(s))  Comprehensive metabolic panel     Status: Abnormal   Collection Time: 05/26/20  8:34 AM  Result Value Ref Range   Sodium 138 135 - 145 mmol/L   Potassium 4.3 3.5 - 5.1  mmol/L   Chloride 105 98 - 111 mmol/L   CO2 24 22 - 32 mmol/L   Glucose, Bld 96 70 - 99 mg/dL    Comment: Glucose reference range applies only to samples taken after fasting for at least 8 hours.   BUN 29 (H) 8 - 23 mg/dL   Creatinine, Ser 0.80 0.61 - 1.24 mg/dL   Calcium 9.1 8.9 - 10.3 mg/dL   Total Protein 7.4 6.5 - 8.1 g/dL   Albumin 3.5 3.5 - 5.0 g/dL   AST 41 15 - 41 U/L   ALT 42 0 - 44 U/L   Alkaline Phosphatase 151 (H) 38 - 126 U/L   Total Bilirubin 0.6 0.3 - 1.2 mg/dL   GFR calc non Af Amer >60 >60 mL/min   GFR calc Af Amer >60 >60 mL/min   Anion gap 9 5 - 15    Comment: Performed at [facility], [address] [place], [place] [address]  CBC with Differential     Status: Abnormal   Collection Time: 05/26/20  8:34 AM  Result Value Ref Range   WBC 12.6 (H) 4.0 - 10.5 K/uL   RBC 4.58 4.22 - 5.81 MIL/uL   Hemoglobin 14.6 13.0 - 17.0 g/dL   HCT 43.4 39 - 52 %   MCV 94.8 80.0 - 100.0 fL   MCH 31.9 26.0 - 34.0 pg   MCHC 33.6 30.0 - 36.0 g/dL   RDW 15.7 (H) 11.5 - 15.5 %   Platelets 161 150 - 400 K/uL   nRBC 0.0 0.0 - 0.2 %   Neutrophils Relative % 68 %   Neutro Abs 8.7 (H) 1.7 - 7.7 K/uL   Lymphocytes Relative 19 %  Lymphs Abs 2.3 0.7 - 4.0 K/uL   Monocytes Relative 9 %   Monocytes Absolute 1.2 (H) 0 - 1 K/uL   Eosinophils Relative 2 %   Eosinophils Absolute 0.2 0 - 0 K/uL   Basophils Relative 1 %   Basophils Absolute 0.1 0 - 0 K/uL   Immature Granulocytes 1 %   Abs Immature Granulocytes 0.09 (H) 0.00 - 0.07 K/uL    Comment: Performed at [facility], [address] [place], [place] [address]  Protein, urine, random     Status: None   Collection Time: 05/26/20  8:34 AM  Result Value Ref Range   Total Protein, Urine 14 mg/dL    Comment: NO NORMAL RANGE ESTABLISHED FOR THIS TEST Performed at [facility], [address] [place], [place] [address]   Comprehensive metabolic panel     Status: Abnormal   Collection Time: 06/17/20  8:31 AM    Result Value Ref Range   Sodium 139 135 - 145 mmol/L   Potassium 4.1 3.5 - 5.1 mmol/L   Chloride 105 98 - 111 mmol/L   CO2 26 22 - 32 mmol/L   Glucose, Bld 96 70 - 99 mg/dL    Comment: Glucose reference range applies only to samples taken after fasting for at least 8 hours.   BUN 28 (H) 8 - 23 mg/dL   Creatinine, Ser 0.78 0.61 - 1.24 mg/dL   Calcium 8.9 8.9 - 10.3 mg/dL   Total Protein 7.3 6.5 - 8.1 g/dL   Albumin 3.6 3.5 - 5.0 g/dL   AST 39 15 - 41 U/L   ALT 31 0 - 44 U/L   Alkaline Phosphatase 98 38 - 126 U/L   Total Bilirubin 0.8 0.3 - 1.2 mg/dL   GFR calc non Af Amer >60 >60 mL/min   GFR calc Af Amer >60 >60 mL/min   Anion gap 8 5 - 15    Comment: Performed at [facility], [address] [place], [place] [address]  CBC with Differential     Status: Abnormal   Collection Time: 06/17/20  8:31 AM  Result Value Ref Range   WBC 9.5 4.0 - 10.5 K/uL   RBC 4.35 4.22 - 5.81 MIL/uL   Hemoglobin 13.7 13.0 - 17.0 g/dL   HCT 41.2 39 - 52 %   MCV 94.7 80.0 - 100.0 fL   MCH 31.5 26.0 - 34.0 pg   MCHC 33.3 30.0 - 36.0 g/dL   RDW 15.1 11.5 - 15.5 %   Platelets 145 (L) 150 - 400 K/uL   nRBC 0.0 0.0 - 0.2 %   Neutrophils Relative % 67 %   Neutro Abs 6.4 1.7 - 7.7 K/uL   Lymphocytes Relative 21 %   Lymphs Abs 2.0 0.7 - 4.0 K/uL   Monocytes Relative 8 %   Monocytes Absolute 0.8 0 - 1 K/uL   Eosinophils Relative 2 %   Eosinophils Absolute 0.2 0 - 0 K/uL   Basophils Relative 1 %   Basophils Absolute 0.1 0 - 0 K/uL   Immature Granulocytes 1 %   Abs Immature Granulocytes 0.07 0.00 - 0.07 K/uL    Comment: Performed at [facility], [address] [place], [place] [address]  Protein, urine, random     Status: None   Collection Time: 06/17/20  8:31 AM  Result Value Ref Range   Total Protein, Urine 17 mg/dL    Comment: NO NORMAL RANGE ESTABLISHED FOR THIS TEST Performed at  [facility], [address] [place], [place] [address]   Protein, urine, random      Status: None   Collection Time: 07/07/20  7:57 AM  Result Value Ref Range   Total Protein, Urine 16 mg/dL    Comment: NO NORMAL RANGE ESTABLISHED FOR THIS TEST Performed at [facility], [address] [place], [place] [address]   CEA     Status: Abnormal   Collection Time: 07/07/20  7:57 AM  Result Value Ref Range   CEA 4.9 (H) 0.0 - 4.7 ng/mL    Comment: (NOTE)                             Nonsmokers          <3.9                             Smokers             <5.6 [employer] Electrochemiluminescence Immunoassay (ECLIA) Values obtained with different assay methods or kits cannot be used interchangeably.  Results cannot be interpreted as absolute evidence of the presence or absence of malignant disease. Performed At: [facility] [place], [place] [id number] Rush Farmer MD IN:[id number]   Comprehensive metabolic panel     Status: Abnormal   Collection Time: 07/07/20  7:57 AM  Result Value Ref Range   Sodium 135 135 - 145 mmol/L   Potassium 4.0 3.5 - 5.1 mmol/L   Chloride 101 98 - 111 mmol/L   CO2 25 22 - 32 mmol/L   Glucose, Bld 98 70 - 99 mg/dL    Comment: Glucose reference range applies only to samples taken after fasting for at least 8 hours.   BUN 25 (H) 8 - 23 mg/dL   Creatinine, Ser 0.89 0.61 - 1.24 mg/dL   Calcium 9.1 8.9 - 10.3 mg/dL   Total Protein 7.6 6.5 - 8.1 g/dL   Albumin 4.1 3.5 - 5.0 g/dL   AST 28 15 - 41 U/L   ALT 25 0 - 44 U/L   Alkaline Phosphatase 101 38 - 126 U/L   Total Bilirubin 0.8 0.3 - 1.2 mg/dL   GFR calc non Af Amer >60 >60 mL/min   GFR calc Af Amer >60 >60 mL/min   Anion gap 9 5 - 15    Comment: Performed at [facility], [address] [place], [place] [address]  CBC with Differential/Platelet     Status: Abnormal   Collection Time: 07/07/20  7:57 AM  Result Value Ref Range   WBC 13.0 (H) 4.0 - 10.5 K/uL   RBC 4.57 4.22 - 5.81 MIL/uL   Hemoglobin 14.7 13.0 - 17.0 g/dL   HCT 42.7 39 -  52 %   MCV 93.4 80.0 - 100.0 fL   MCH 32.2 26.0 - 34.0 pg   MCHC 34.4 30.0 - 36.0 g/dL   RDW 15.1 11.5 - 15.5 %   Platelets 159 150 - 400 K/uL   nRBC 0.0 0.0 - 0.2 %   Neutrophils Relative % 69 %   Neutro Abs 9.1 (H) 1.7 - 7.7 K/uL   Lymphocytes Relative 20 %   Lymphs Abs 2.5 0.7 - 4.0 K/uL   Monocytes Relative 8 %   Monocytes Absolute 1.0 0 - 1 K/uL   Eosinophils Relative 1 %   Eosinophils Absolute 0.2 0 - 0  K/uL   Basophils Relative 1 %   Basophils Absolute 0.1 0 - 0 K/uL   Immature Granulocytes 1 %   Abs Immature Granulocytes 0.08 (H) 0.00 - 0.07 K/uL    Comment: Performed at [facility], [address] [place], [place] [address]  Protein, urine, random     Status: None   Collection Time: 07/28/20  8:25 AM  Result Value Ref Range   Total Protein, Urine 18 mg/dL    Comment: NO NORMAL RANGE ESTABLISHED FOR THIS TEST Performed at [facility], [address] [place], [place] [address]   Comprehensive metabolic panel     Status: Abnormal   Collection Time: 07/28/20  8:30 AM  Result Value Ref Range   Sodium 138 135 - 145 mmol/L   Potassium 4.2 3.5 - 5.1 mmol/L   Chloride 107 98 - 111 mmol/L   CO2 24 22 - 32 mmol/L   Glucose, Bld 97 70 - 99 mg/dL    Comment: Glucose reference range applies only to samples taken after fasting for at least 8 hours.   BUN 29 (H) 8 - 23 mg/dL   Creatinine, Ser 0.91 0.61 - 1.24 mg/dL   Calcium 9.0 8.9 - 10.3 mg/dL   Total Protein 7.7 6.5 - 8.1 g/dL   Albumin 3.9 3.5 - 5.0 g/dL   AST 33 15 - 41 U/L   ALT 27 0 - 44 U/L   Alkaline Phosphatase 89 38 - 126 U/L   Total Bilirubin 0.7 0.3 - 1.2 mg/dL   GFR calc non Af Amer >60 >60 mL/min   GFR calc Af Amer >60 >60 mL/min   Anion gap 7 5 - 15    Comment: Performed at [facility], [address] [place], [place] [address]  CBC with Differential/Platelet     Status: Abnormal   Collection Time: 07/28/20  8:30 AM  Result Value Ref Range   WBC 10.7 (H) 4.0 - 10.5 K/uL   RBC  4.38 4.22 - 5.81 MIL/uL   Hemoglobin 14.2 13.0 - 17.0 g/dL   HCT 40.8 39 - 52 %   MCV 93.2 80.0 - 100.0 fL   MCH 32.4 26.0 - 34.0 pg   MCHC 34.8 30.0 - 36.0 g/dL   RDW 15.5 11.5 - 15.5 %   Platelets 145 (L) 150 - 400 K/uL   nRBC 0.0 0.0 - 0.2 %   Neutrophils Relative % 70 %   Neutro Abs 7.5 1.7 - 7.7 K/uL   Lymphocytes Relative 19 %   Lymphs Abs 2.0 0.7 - 4.0 K/uL   Monocytes Relative 8 %   Monocytes Absolute 0.9 0 - 1 K/uL   Eosinophils Relative 1 %   Eosinophils Absolute 0.2 0 - 0 K/uL   Basophils Relative 1 %   Basophils Absolute 0.1 0 - 0 K/uL   Immature Granulocytes 1 %   Abs Immature Granulocytes 0.07 0.00 - 0.07 K/uL    Comment: Performed at [facility], [address] [place], [place] [address]    Radiology No results found.  Assessment/Plan  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 ([place]) This was the reason the Port-A-Cath was placed initially a little over a year ago.  Doing well after treatment.  Vascular port complication The patient has severe pain and a small ulcer overlying his right sided Port-A-Cath.  He was given antibiotics with no improvement.  It does not appear overtly infected, but the scab and the skin tension related to  his lack of subcutaneous tissue and likely chronically malnourished state has led to significant pain and tension in the area.  I think this port will need to be removed particular with the open sore in the area even though it is not overtly infected.  He is still requiring chemotherapy and so at the time of removal, we can place a new Port-A-Cath on the opposite side.  I discussed the risks and benefits of the procedure.  The patient is agreeable to proceed.  Varicose veins of leg with pain, right The patient has a previous history of treatment for varicose veins with what sounds like laser ablation many years ago on the left leg.  He does have some prominent varicosities now on the right, but the symptoms are  fairly mild and given his other ongoing issues I would not recommend any treatment at current.  This can be reassessed after he finishes his chemotherapy.    Leotis Pain, MD  08/05/2020 11:59 AM    This note was created with Dragon medical transcription system.  Any errors from dictation are purely unintentional

## 2020-08-05 NOTE — Telephone Encounter (Signed)
Spoke with the patient and he is scheduled with Dr. Lucky Cowboy on 08/11/20 with a 9:15 am arrival time to the [facility]. Covid testing is on 08/07/20 between 8-1 pm at the [facility]. Pre-procedure instructions were discussed and will be mailed.

## 2020-08-05 NOTE — Assessment & Plan Note (Signed)
This was the reason the Port-A-Cath was placed initially a little over a year ago.  Doing well after treatment.

## 2020-08-05 NOTE — Assessment & Plan Note (Signed)
The patient has a previous history of treatment for varicose veins with what sounds like laser ablation many years ago on the left leg.  He does have some prominent varicosities now on the right, but the symptoms are fairly mild and given his other ongoing issues I would not recommend any treatment at current.  This can be reassessed after he finishes his chemotherapy.

## 2020-08-05 NOTE — H&P (View-Only) (Signed)
MRN : [id number]  Eric Simon is a 69 y.o. (07/06/1951) male who presents with chief complaint of  Chief Complaint  Patient presents with  . Follow-up    Cancer center req. port placed and is now red and hurting  .  History of Present Illness: Patient returns today in follow up of port that was placed a little over a year ago.  This was working fine with no issues until about a month ago when he had a infusion treatment for 3 consecutive days at which time is extremely painful.  This has left a small scab/ulcer overlying the port in the area where the needle was.  It is unclear if he may have had some extravasation of the infusion into the soft tissue or a difficult access at that time, but since then, his port is extremely painful and tender to the touch.  He does not have a lot of soft tissue and the skin is quite tense overlying the port at a baseline.  No erythema or drainage.  No fever or chills.  He is still getting ongoing chemotherapy with no scheduled and insight to his knowledge so he still has need for a port.  Current Outpatient Medications  Medication Sig Dispense Refill  . aspirin EC 81 MG tablet Take 81 mg by mouth daily.    [name] azelastine (ASTELIN) 0.1 % nasal spray Place 1 spray into both nostrils 1 day or 1 dose.    . cephALEXin (KEFLEX) 500 MG capsule Take 1 capsule (500 mg total) by mouth 4 (four) times daily. 28 capsule 0  . cetirizine (ZYRTEC) 10 MG tablet Take 1 tablet (10 mg total) by mouth daily. (Patient taking differently: Take 10 mg by mouth daily as needed. ) 30 tablet 0  . chlorthalidone (HYGROTON) 25 MG tablet Take 1 tablet by mouth daily.    [name] dexamethasone (DECADRON) 4 MG tablet Take 2 tablets (8 mg total) by mouth daily. Start the day after chemotherapy for 2 days. Take with food. 30 tablet 1  . DEXILANT 60 MG capsule Take 1 capsule by mouth 1 day or 1 dose.    . DULoxetine (CYMBALTA) 30 MG capsule Take 1 capsule (30 mg total) by mouth 2 (two) times  daily. 60 capsule 1  . fluticasone (FLONASE) 50 MCG/ACT nasal spray Place 2 sprays into both nostrils 1 day or 1 dose.    . fluticasone (VERAMYST) 27.5 MCG/SPRAY nasal spray Place 2 sprays into the nose daily.    [name] lidocaine-prilocaine (EMLA) cream Apply to affected area once 30 g 3  . magic mouthwash w/lidocaine SOLN Take 5-10 mLs by mouth 4 (four) times daily as needed for mouth pain. 480 mL 3  . montelukast (SINGULAIR) 10 MG tablet Take 10 mg by mouth at bedtime.    . nicotine (NICODERM CQ - DOSED IN MG/24 HOURS) 21 mg/24hr patch Place 1 patch onto the skin 1 day or 1 dose.    . ondansetron (ZOFRAN) 8 MG tablet Take 1 tablet (8 mg total) by mouth 2 (two) times daily as needed for refractory nausea / vomiting. Start on day 3 after chemotherapy. 30 tablet 1  . oxyCODONE (OXY IR/ROXICODONE) 5 MG immediate release tablet Take 1 tablet (5 mg total) by mouth every 6 (six) hours as needed for severe pain. 30 tablet 0  . pantoprazole (PROTONIX) 40 MG tablet Take 40 mg by mouth daily.    . potassium chloride SA (K-DUR) 20 MEQ tablet Take 1 tablet (  20 mEq total) by mouth daily. 14 tablet 0  . pregabalin (LYRICA) 75 MG capsule Take 1 capsule (75 mg total) by mouth 2 (two) times daily. 60 capsule 2  . prochlorperazine (COMPAZINE) 10 MG tablet Take 1 tablet (10 mg total) by mouth every 6 (six) hours as needed (Nausea or vomiting). 30 tablet 1  . simvastatin (ZOCOR) 20 MG tablet Take 20 mg by mouth daily.    . sucralfate (CARAFATE) 1 g tablet Take 1 g by mouth 4 (four) times daily.    . SYMBICORT 80-4.5 MCG/ACT inhaler Inhale 2 puffs into the lungs 1 day or 1 dose.    . tamsulosin (FLOMAX) 0.4 MG CAPS capsule Take 0.4 mg by mouth.     No current facility-administered medications for this visit.   Facility-Administered Medications Ordered in Other Visits  Medication Dose Route Frequency Provider Last Rate Last Admin  . sodium chloride flush (NS) 0.9 % injection 10 mL  10 mL Intracatheter PRN Sindy Guadeloupe, MD   10 mL at 08/15/19 1405    Past Medical History:  Diagnosis Date  . Colon cancer ([place])   . COPD (chronic obstructive pulmonary disease) ([place])   . Hyperlipidemia     Past Surgical History:  Procedure Laterality Date  . COLONOSCOPY WITH PROPOFOL N/A 06/26/2019   Procedure: COLONOSCOPY WITH PROPOFOL;  Surgeon: [name], MD;  Location: [facility] ENDOSCOPY;  Service: Gastroenterology;  Laterality: N/A;  . PORTA CATH INSERTION N/A 06/25/2019   Procedure: PORTA CATH INSERTION;  Surgeon: Algernon Huxley, MD;  Location: [facility];  Service: Cardiovascular;  Laterality: N/A;     Social History   Tobacco Use  . Smoking status: Current Every Day Smoker    Packs/day: 0.50    Years: 40.00    Pack years: 20.00    Types: Cigarettes  . Smokeless tobacco: Never Used  . Tobacco comment: smoking 40 years  Vaping Use  . Vaping Use: Never used  Substance Use Topics  . Alcohol use: No  . Drug use: No      Family History  Problem Relation Age of Onset  . Brain cancer Father   no bleeding or clotting disorders No aneurysms  No Known Allergies   REVIEW OF SYSTEMS (Negative unless checked)  Constitutional: [] Weight loss  [] Fever  [] Chills Cardiac: [] Chest pain   [] Chest pressure   [] Palpitations   [] Shortness of breath when laying flat   [] Shortness of breath at rest   [] Shortness of breath with exertion. Vascular:  [] Pain in legs with walking   [] Pain in legs at rest   [] Pain in legs when laying flat   [] Claudication   [] Pain in feet when walking  [] Pain in feet at rest  [] Pain in feet when laying flat   [] History of DVT   [] Phlebitis   [] Swelling in legs   [] Varicose veins   [] Non-healing ulcers Pulmonary:   [] Uses home oxygen   [] Productive cough   [] Hemoptysis   [] Wheeze  [x] COPD   [] Asthma Neurologic:  [] Dizziness  [] Blackouts   [] Seizures   [] History of stroke   [] History of TIA  [] Aphasia   [] Temporary blindness   [] Dysphagia   [] Weakness or numbness in arms   [] Weakness  or numbness in legs Musculoskeletal:  [x] Arthritis   [] Joint swelling   [] Joint pain   [] Low back pain Hematologic:  [] Easy bruising  [] Easy bleeding   [] Hypercoagulable state   [] Anemic   Gastrointestinal:  [] Blood in stool   [] Vomiting  blood  [] Gastroesophageal reflux/heartburn   [] Abdominal pain Genitourinary:  [] Chronic kidney disease   [] Difficult urination  [] Frequent urination  [] Burning with urination   [] Hematuria Skin:  [] Rashes   [] Ulcers   [] Wounds Psychological:  [] History of anxiety   []  History of major depression.  Physical Examination  BP (!) 150/74   Pulse 74   Ht 5\' 7"  (1.702 m)   Wt 146 lb (66.2 kg)   BMI 22.87 kg/m  Gen:  WD/WN, NAD Head: [place]/AT, No temporalis wasting. Ear/Nose/Throat: Hearing grossly intact, nares w/o erythema or drainage Eyes: Conjunctiva clear. Sclera non-icteric Neck: Supple.  Trachea midline Pulmonary:  Good air movement, no use of accessory muscles.  Cardiac: RRR, no JVD Vascular: Right-sided Port-A-Cath with somewhat tense skin and very small amount of subcutaneous tissue.  The port does definitely push out on the skin and create some tension.  There is a small ulcer present from the area of his recent access.  No purulent drainage.  Not particularly erythematous. Vessel Right Left  Radial Palpable Palpable           Musculoskeletal: M/S 5/5 throughout.  No deformity or atrophy.  Some varicosities are present particular in the right leg.  Trace lower extremity edema. Neurologic: Sensation grossly intact in extremities.  Symmetrical.  Speech is fluent.  Psychiatric: Judgment intact, Mood & affect appropriate for pt's clinical situation. Dermatologic: No rashes or ulcers noted.  No cellulitis or open wounds.       Labs Recent Results (from the past 2160 hour(s))  Comprehensive metabolic panel     Status: Abnormal   Collection Time: 05/26/20  8:34 AM  Result Value Ref Range   Sodium 138 135 - 145 mmol/L   Potassium 4.3 3.5 - 5.1  mmol/L   Chloride 105 98 - 111 mmol/L   CO2 24 22 - 32 mmol/L   Glucose, Bld 96 70 - 99 mg/dL    Comment: Glucose reference range applies only to samples taken after fasting for at least 8 hours.   BUN 29 (H) 8 - 23 mg/dL   Creatinine, Ser 0.80 0.61 - 1.24 mg/dL   Calcium 9.1 8.9 - 10.3 mg/dL   Total Protein 7.4 6.5 - 8.1 g/dL   Albumin 3.5 3.5 - 5.0 g/dL   AST 41 15 - 41 U/L   ALT 42 0 - 44 U/L   Alkaline Phosphatase 151 (H) 38 - 126 U/L   Total Bilirubin 0.6 0.3 - 1.2 mg/dL   GFR calc non Af Amer >60 >60 mL/min   GFR calc Af Amer >60 >60 mL/min   Anion gap 9 5 - 15    Comment: Performed at [facility], [address] [place],  [address]  CBC with Differential     Status: Abnormal   Collection Time: 05/26/20  8:34 AM  Result Value Ref Range   WBC 12.6 (H) 4.0 - 10.5 K/uL   RBC 4.58 4.22 - 5.81 MIL/uL   Hemoglobin 14.6 13.0 - 17.0 g/dL   HCT 43.4 39 - 52 %   MCV 94.8 80.0 - 100.0 fL   MCH 31.9 26.0 - 34.0 pg   MCHC 33.6 30.0 - 36.0 g/dL   RDW 15.7 (H) 11.5 - 15.5 %   Platelets 161 150 - 400 K/uL   nRBC 0.0 0.0 - 0.2 %   Neutrophils Relative % 68 %   Neutro Abs 8.7 (H) 1.7 - 7.7 K/uL   Lymphocytes Relative 19 %   Lymphs Abs  2.3 0.7 - 4.0 K/uL   Monocytes Relative 9 %   Monocytes Absolute 1.2 (H) 0 - 1 K/uL   Eosinophils Relative 2 %   Eosinophils Absolute 0.2 0 - 0 K/uL   Basophils Relative 1 %   Basophils Absolute 0.1 0 - 0 K/uL   Immature Granulocytes 1 %   Abs Immature Granulocytes 0.09 (H) 0.00 - 0.07 K/uL    Comment: Performed at [facility], [address] [place], [place] [address]  Protein, urine, random     Status: None   Collection Time: 05/26/20  8:34 AM  Result Value Ref Range   Total Protein, Urine 14 mg/dL    Comment: NO NORMAL RANGE ESTABLISHED FOR THIS TEST Performed at [facility], [address] [place], [place] [address]   Comprehensive metabolic panel     Status: Abnormal   Collection Time: 06/17/20  8:31 AM    Result Value Ref Range   Sodium 139 135 - 145 mmol/L   Potassium 4.1 3.5 - 5.1 mmol/L   Chloride 105 98 - 111 mmol/L   CO2 26 22 - 32 mmol/L   Glucose, Bld 96 70 - 99 mg/dL    Comment: Glucose reference range applies only to samples taken after fasting for at least 8 hours.   BUN 28 (H) 8 - 23 mg/dL   Creatinine, Ser 0.78 0.61 - 1.24 mg/dL   Calcium 8.9 8.9 - 10.3 mg/dL   Total Protein 7.3 6.5 - 8.1 g/dL   Albumin 3.6 3.5 - 5.0 g/dL   AST 39 15 - 41 U/L   ALT 31 0 - 44 U/L   Alkaline Phosphatase 98 38 - 126 U/L   Total Bilirubin 0.8 0.3 - 1.2 mg/dL   GFR calc non Af Amer >60 >60 mL/min   GFR calc Af Amer >60 >60 mL/min   Anion gap 8 5 - 15    Comment: Performed at [facility], [address] [place], [place] [address]  CBC with Differential     Status: Abnormal   Collection Time: 06/17/20  8:31 AM  Result Value Ref Range   WBC 9.5 4.0 - 10.5 K/uL   RBC 4.35 4.22 - 5.81 MIL/uL   Hemoglobin 13.7 13.0 - 17.0 g/dL   HCT 41.2 39 - 52 %   MCV 94.7 80.0 - 100.0 fL   MCH 31.5 26.0 - 34.0 pg   MCHC 33.3 30.0 - 36.0 g/dL   RDW 15.1 11.5 - 15.5 %   Platelets 145 (L) 150 - 400 K/uL   nRBC 0.0 0.0 - 0.2 %   Neutrophils Relative % 67 %   Neutro Abs 6.4 1.7 - 7.7 K/uL   Lymphocytes Relative 21 %   Lymphs Abs 2.0 0.7 - 4.0 K/uL   Monocytes Relative 8 %   Monocytes Absolute 0.8 0 - 1 K/uL   Eosinophils Relative 2 %   Eosinophils Absolute 0.2 0 - 0 K/uL   Basophils Relative 1 %   Basophils Absolute 0.1 0 - 0 K/uL   Immature Granulocytes 1 %   Abs Immature Granulocytes 0.07 0.00 - 0.07 K/uL    Comment: Performed at [facility], [address] [place], [place] [address]  Protein, urine, random     Status: None   Collection Time: 06/17/20  8:31 AM  Result Value Ref Range   Total Protein, Urine 17 mg/dL    Comment: NO NORMAL RANGE ESTABLISHED FOR THIS TEST Performed at [facility], [place], [place] [address]   Protein, urine, random      Status: None   Collection Time: 07/07/20  7:57 AM  Result Value Ref Range   Total Protein, Urine 16 mg/dL    Comment: NO NORMAL RANGE ESTABLISHED FOR THIS TEST Performed at [facility], [address] [place], [place] [address]   CEA     Status: Abnormal   Collection Time: 07/07/20  7:57 AM  Result Value Ref Range   CEA 4.9 (H) 0.0 - 4.7 ng/mL    Comment: (NOTE)                             Nonsmokers          <3.9                             Smokers             <5.6 [employer] Electrochemiluminescence Immunoassay (ECLIA) Values obtained with different assay methods or kits cannot be used interchangeably.  Results cannot be interpreted as absolute evidence of the presence or absence of malignant disease. Performed At: [facility] [place], [place] [id number] Rush Farmer MD [id number]   Comprehensive metabolic panel     Status: Abnormal   Collection Time: 07/07/20  7:57 AM  Result Value Ref Range   Sodium 135 135 - 145 mmol/L   Potassium 4.0 3.5 - 5.1 mmol/L   Chloride 101 98 - 111 mmol/L   CO2 25 22 - 32 mmol/L   Glucose, Bld 98 70 - 99 mg/dL    Comment: Glucose reference range applies only to samples taken after fasting for at least 8 hours.   BUN 25 (H) 8 - 23 mg/dL   Creatinine, Ser 0.89 0.61 - 1.24 mg/dL   Calcium 9.1 8.9 - 10.3 mg/dL   Total Protein 7.6 6.5 - 8.1 g/dL   Albumin 4.1 3.5 - 5.0 g/dL   AST 28 15 - 41 U/L   ALT 25 0 - 44 U/L   Alkaline Phosphatase 101 38 - 126 U/L   Total Bilirubin 0.8 0.3 - 1.2 mg/dL   GFR calc non Af Amer >60 >60 mL/min   GFR calc Af Amer >60 >60 mL/min   Anion gap 9 5 - 15    Comment: Performed at [facility], [place], [place] [address]  CBC with Differential/Platelet     Status: Abnormal   Collection Time: 07/07/20  7:57 AM  Result Value Ref Range   WBC 13.0 (H) 4.0 - 10.5 K/uL   RBC 4.57 4.22 - 5.81 MIL/uL   Hemoglobin 14.7 13.0 - 17.0 g/dL   HCT 42.7 39 -  52 %   MCV 93.4 80.0 - 100.0 fL   MCH 32.2 26.0 - 34.0 pg   MCHC 34.4 30.0 - 36.0 g/dL   RDW 15.1 11.5 - 15.5 %   Platelets 159 150 - 400 K/uL   nRBC 0.0 0.0 - 0.2 %   Neutrophils Relative % 69 %   Neutro Abs 9.1 (H) 1.7 - 7.7 K/uL   Lymphocytes Relative 20 %   Lymphs Abs 2.5 0.7 - 4.0 K/uL   Monocytes Relative 8 %   Monocytes Absolute 1.0 0 - 1 K/uL   Eosinophils Relative 1 %   Eosinophils Absolute 0.2 0 - 0 K/uL  Basophils Relative 1 %   Basophils Absolute 0.1 0 - 0 K/uL   Immature Granulocytes 1 %   Abs Immature Granulocytes 0.08 (H) 0.00 - 0.07 K/uL    Comment: Performed at [facility], [address] [place], [place] [address]  Protein, urine, random     Status: None   Collection Time: 07/28/20  8:25 AM  Result Value Ref Range   Total Protein, Urine 18 mg/dL    Comment: NO NORMAL RANGE ESTABLISHED FOR THIS TEST Performed at [facility], [address] [place], [place] [address]   Comprehensive metabolic panel     Status: Abnormal   Collection Time: 07/28/20  8:30 AM  Result Value Ref Range   Sodium 138 135 - 145 mmol/L   Potassium 4.2 3.5 - 5.1 mmol/L   Chloride 107 98 - 111 mmol/L   CO2 24 22 - 32 mmol/L   Glucose, Bld 97 70 - 99 mg/dL    Comment: Glucose reference range applies only to samples taken after fasting for at least 8 hours.   BUN 29 (H) 8 - 23 mg/dL   Creatinine, Ser 0.91 0.61 - 1.24 mg/dL   Calcium 9.0 8.9 - 10.3 mg/dL   Total Protein 7.7 6.5 - 8.1 g/dL   Albumin 3.9 3.5 - 5.0 g/dL   AST 33 15 - 41 U/L   ALT 27 0 - 44 U/L   Alkaline Phosphatase 89 38 - 126 U/L   Total Bilirubin 0.7 0.3 - 1.2 mg/dL   GFR calc non Af Amer >60 >60 mL/min   GFR calc Af Amer >60 >60 mL/min   Anion gap 7 5 - 15    Comment: Performed at [facility], [address] [place],  [address]  CBC with Differential/Platelet     Status: Abnormal   Collection Time: 07/28/20  8:30 AM  Result Value Ref Range   WBC 10.7 (H) 4.0 - 10.5 K/uL   RBC  4.38 4.22 - 5.81 MIL/uL   Hemoglobin 14.2 13.0 - 17.0 g/dL   HCT 40.8 39 - 52 %   MCV 93.2 80.0 - 100.0 fL   MCH 32.4 26.0 - 34.0 pg   MCHC 34.8 30.0 - 36.0 g/dL   RDW 15.5 11.5 - 15.5 %   Platelets 145 (L) 150 - 400 K/uL   nRBC 0.0 0.0 - 0.2 %   Neutrophils Relative % 70 %   Neutro Abs 7.5 1.7 - 7.7 K/uL   Lymphocytes Relative 19 %   Lymphs Abs 2.0 0.7 - 4.0 K/uL   Monocytes Relative 8 %   Monocytes Absolute 0.9 0 - 1 K/uL   Eosinophils Relative 1 %   Eosinophils Absolute 0.2 0 - 0 K/uL   Basophils Relative 1 %   Basophils Absolute 0.1 0 - 0 K/uL   Immature Granulocytes 1 %   Abs Immature Granulocytes 0.07 0.00 - 0.07 K/uL    Comment: Performed at [facility], [address] [place],  [address]    Radiology No results found.  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  Doing well after treatment.  Vascular port complication The patient has severe pain and a small ulcer overlying his right sided Port-A-Cath.  He was given antibiotics with no improvement.  It does not appear overtly infected, but the scab and the skin tension related to his lack of  subcutaneous tissue and likely chronically malnourished state has led to significant pain and tension in the area.  I think this port will need to be removed particular with the open sore in the area even though it is not overtly infected.  He is still requiring chemotherapy and so at the time of removal, we can place a new Port-A-Cath on the opposite side.  I discussed the risks and benefits of the procedure.  The patient is agreeable to proceed.  Varicose veins of leg with pain, right The patient has a previous history of treatment for varicose veins with what sounds like laser ablation many years ago on the left leg.  He does have some prominent varicosities now on the right, but the symptoms are  fairly mild and given his other ongoing issues I would not recommend any treatment at current.  This can be reassessed after he finishes his chemotherapy.    Leotis Pain, MD  08/05/2020 11:59 AM    This note was created with Dragon medical transcription system.  Any errors from dictation are purely unintentional

## 2020-08-05 NOTE — Assessment & Plan Note (Signed)
The patient has severe pain and a small ulcer overlying his right sided Port-A-Cath.  He was given antibiotics with no improvement.  It does not appear overtly infected, but the scab and the skin tension related to his lack of subcutaneous tissue and likely chronically malnourished state has led to significant pain and tension in the area.  I think this port will need to be removed particular with the open sore in the area even though it is not overtly infected.  He is still requiring chemotherapy and so at the time of removal, we can place a new Port-A-Cath on the opposite side.  I discussed the risks and benefits of the procedure.  The patient is agreeable to proceed.

## 2020-08-07 ENCOUNTER — Other Ambulatory Visit: Payer: Self-pay

## 2020-08-07 ENCOUNTER — Other Ambulatory Visit
Admission: RE | Admit: 2020-08-07 | Discharge: 2020-08-07 | Disposition: A | Discharge status: Home / Self Care | Payer: Medicare HMO | Source: Ambulatory Visit | Attending: Vascular Surgery | Admitting: Vascular Surgery

## 2020-08-07 DIAGNOSIS — Z01812 Encounter for preprocedural laboratory examination: Secondary | ICD-10-CM | POA: Insufficient documentation

## 2020-08-07 DIAGNOSIS — Z20822 Contact with and (suspected) exposure to covid-19: Secondary | ICD-10-CM | POA: Diagnosis not present

## 2020-08-07 LAB — SARS CORONAVIRUS 2 (TAT 6-24 HRS): SARS Coronavirus 2: NEGATIVE

## 2020-08-10 ENCOUNTER — Other Ambulatory Visit (INDEPENDENT_AMBULATORY_CARE_PROVIDER_SITE_OTHER): Payer: Self-pay | Admitting: Nurse Practitioner

## 2020-08-11 ENCOUNTER — Other Ambulatory Visit: Payer: Self-pay

## 2020-08-11 ENCOUNTER — Ambulatory Visit
Admission: RE | Admit: 2020-08-11 | Discharge: 2020-08-11 | Disposition: A | Discharge status: Home / Self Care | Payer: Medicare HMO | Attending: Vascular Surgery | Admitting: Vascular Surgery

## 2020-08-11 ENCOUNTER — Encounter: Payer: Self-pay | Admitting: Vascular Surgery

## 2020-08-11 ENCOUNTER — Encounter
Admission: RE | Disposition: A | Discharge status: Home / Self Care | Payer: Self-pay | Source: Home / Self Care | Attending: Vascular Surgery

## 2020-08-11 DIAGNOSIS — F17210 Nicotine dependence, cigarettes, uncomplicated: Secondary | ICD-10-CM | POA: Diagnosis not present

## 2020-08-11 DIAGNOSIS — Z79899 Other long term (current) drug therapy: Secondary | ICD-10-CM | POA: Diagnosis not present

## 2020-08-11 DIAGNOSIS — Z7982 Long term (current) use of aspirin: Secondary | ICD-10-CM | POA: Diagnosis not present

## 2020-08-11 DIAGNOSIS — Z9221 Personal history of antineoplastic chemotherapy: Secondary | ICD-10-CM | POA: Insufficient documentation

## 2020-08-11 DIAGNOSIS — J449 Chronic obstructive pulmonary disease, unspecified: Secondary | ICD-10-CM | POA: Diagnosis not present

## 2020-08-11 DIAGNOSIS — E785 Hyperlipidemia, unspecified: Secondary | ICD-10-CM | POA: Diagnosis not present

## 2020-08-11 DIAGNOSIS — Z7951 Long term (current) use of inhaled steroids: Secondary | ICD-10-CM | POA: Diagnosis not present

## 2020-08-11 DIAGNOSIS — C772 Secondary and unspecified malignant neoplasm of intra-abdominal lymph nodes: Secondary | ICD-10-CM | POA: Insufficient documentation

## 2020-08-11 DIAGNOSIS — I83811 Varicose veins of right lower extremities with pain: Secondary | ICD-10-CM | POA: Diagnosis not present

## 2020-08-11 DIAGNOSIS — C189 Malignant neoplasm of colon, unspecified: Secondary | ICD-10-CM | POA: Diagnosis not present

## 2020-08-11 DIAGNOSIS — Z452 Encounter for adjustment and management of vascular access device: Secondary | ICD-10-CM | POA: Diagnosis present

## 2020-08-11 HISTORY — PX: PORTA CATH INSERTION: CATH118285

## 2020-08-11 HISTORY — PX: PORTA CATH REMOVAL: CATH118286

## 2020-08-11 SURGERY — PORTA CATH INSERTION
Anesthesia: Moderate Sedation | Laterality: Right

## 2020-08-11 MED ORDER — HYDROMORPHONE HCL 1 MG/ML IJ SOLN: 1.0000 mg | Freq: Once | INTRAMUSCULAR | Status: DC | PRN

## 2020-08-11 MED ORDER — CEFAZOLIN SODIUM-DEXTROSE 2-4 GM/100ML-% IV SOLN: 2.0000 g | Freq: Once | INTRAVENOUS | Status: AC

## 2020-08-11 MED ORDER — FENTANYL CITRATE (PF) 100 MCG/2ML IJ SOLN
INTRAMUSCULAR | Status: DC | PRN
  Administered 2020-08-11 (×2): 50 ug via INTRAVENOUS

## 2020-08-11 MED ORDER — DIPHENHYDRAMINE HCL 50 MG/ML IJ SOLN: 50.0000 mg | Freq: Once | INTRAMUSCULAR | Status: DC | PRN

## 2020-08-11 MED ORDER — CEFAZOLIN SODIUM-DEXTROSE 2-4 GM/100ML-% IV SOLN
INTRAVENOUS | Status: AC
  Administered 2020-08-11: 2 g via INTRAVENOUS
  Filled 2020-08-11: qty 100

## 2020-08-11 MED ORDER — MIDAZOLAM HCL 5 MG/5ML IJ SOLN
INTRAMUSCULAR | Status: AC
  Filled 2020-08-11: qty 5

## 2020-08-11 MED ORDER — MIDAZOLAM HCL 2 MG/ML PO SYRP: 8.0000 mg | ORAL_SOLUTION | Freq: Once | ORAL | Status: DC | PRN

## 2020-08-11 MED ORDER — MIDAZOLAM HCL 2 MG/2ML IJ SOLN
INTRAMUSCULAR | Status: DC | PRN
  Administered 2020-08-11 (×2): 2 mg via INTRAVENOUS

## 2020-08-11 MED ORDER — SODIUM CHLORIDE 0.9 % IV SOLN: INTRAVENOUS | Status: DC

## 2020-08-11 MED ORDER — FAMOTIDINE 20 MG PO TABS: 40.0000 mg | ORAL_TABLET | Freq: Once | ORAL | Status: DC | PRN

## 2020-08-11 MED ORDER — CHLORHEXIDINE GLUCONATE CLOTH 2 % EX PADS
6.0000 | MEDICATED_PAD | Freq: Every day | CUTANEOUS | Status: DC
  Administered 2020-08-11: 2 via TOPICAL

## 2020-08-11 MED ORDER — SODIUM CHLORIDE 0.9 % IV SOLN
Freq: Once | INTRAVENOUS | Status: DC
  Filled 2020-08-11: qty 2

## 2020-08-11 MED ORDER — METHYLPREDNISOLONE SODIUM SUCC 125 MG IJ SOLR: 125.0000 mg | Freq: Once | INTRAMUSCULAR | Status: DC | PRN

## 2020-08-11 MED ORDER — FENTANYL CITRATE (PF) 100 MCG/2ML IJ SOLN
INTRAMUSCULAR | Status: AC
  Filled 2020-08-11: qty 2

## 2020-08-11 MED ORDER — ONDANSETRON HCL 4 MG/2ML IJ SOLN: 4.0000 mg | Freq: Four times a day (QID) | INTRAMUSCULAR | Status: DC | PRN

## 2020-08-11 SURGICAL SUPPLY — 9 items
DERMABOND ADVANCED (GAUZE/BANDAGES/DRESSINGS) ×2
DERMABOND ADVANCED .7 DNX12 (GAUZE/BANDAGES/DRESSINGS) ×4 IMPLANT
KIT PORT POWER 8FR ISP CVUE (Port) ×3 IMPLANT
PACK ANGIOGRAPHY (CUSTOM PROCEDURE TRAY) ×3 IMPLANT
SPONGE XRAY 4X4 16PLY STRL (MISCELLANEOUS) ×6 IMPLANT
SUT MNCRL AB 4-0 PS2 18 (SUTURE) ×6 IMPLANT
SUT PROLENE 0 CT 1 30 (SUTURE) ×6 IMPLANT
SUT VIC AB 3-0 CT1 27 (SUTURE) ×2
SUT VIC AB 3-0 CT1 TAPERPNT 27 (SUTURE) ×4 IMPLANT

## 2020-08-11 NOTE — Op Note (Signed)
[facility]       Operative Note  Date: 08/11/2020  Preoperative diagnosis:  1. Colon cancer, wound over right sided port access site requiring removal  Postoperative diagnosis:  Same as above  Procedures: #1. Ultrasound guidance for vascular access to the left internal jugular vein. #2. Fluoroscopic guidance for placement of catheter. #3. Placement of CT compatible Port-A-Cath, left internal jugular vein. #4.  Removal of right jugular Port-A-Cath  Surgeon: Leotis Pain, MD.   Anesthesia: Local with moderate conscious sedation for approximately 40  minutes using 4 mg of Versed and 100 mcg of Fentanyl  Fluoroscopy time: 2.9 minutes  Contrast used: 0  Estimated blood loss: 10 cc  Indication for the procedure:  The patient is a 69 y.o.male with ongoing chemotherapy for colon cancer who has a wound at his access site over his right sided port.  Although it is not obviously infected, it is very tender and with an open wound it will need to be removed.  The patient needs a Port-A-Cath for durable venous access, chemotherapy, lab draws, and CT scans.  As such, we plan to place a port on the other side and we remove the right-sided Port-A-Cath.  Risks and benefits were discussed and informed consent was obtained.  Description of procedure: The patient was brought to the vascular and interventional radiology suite.  Moderate conscious sedation was administered throughout the procedure during a face to face encounter with the patient with my supervision of the RN administering medicines and monitoring the patient's vital signs, pulse oximetry, telemetry and mental status throughout from the start of the procedure until the patient was taken to the recovery room. The left neck chest and shoulder were sterilely prepped and draped, and a sterile surgical field was created. Ultrasound was used to help visualize a patent left internal jugular vein. This was then accessed under  direct ultrasound guidance without difficulty with the Seldinger needle and a permanent image was recorded. A J-wire was placed. After skin nick and dilatation, the peel-away sheath was then placed over the wire. I then anesthetized an area under the clavicle approximately 1-2 fingerbreadths. A transverse incision was created and an inferior pocket was created with electrocautery and blunt dissection. The port was then brought onto the field, placed into the pocket and secured to the chest wall with 2 Prolene sutures. The catheter was connected to the port and tunneled from the subclavicular incision to the access site. Fluoroscopic guidance was then used to cut the catheter to an appropriate length. The catheter was then placed through the peel-away sheath and the peel-away sheath was removed.  The initial catheter flipped up and was not at the cavoatrial junction in good location.  A wire was replaced and this catheter was removed with a slightly longer catheter parked down deeper.  The catheter tip was parked in excellent location under fluorocoscopic guidance in the cavoatrial junction. The pocket was then irrigated with antibiotic impregnated saline and the wound was closed with a running 3-0 Vicryl and a 4-0 Monocryl. The access incision was closed with a single 4-0 Monocryl. The Huber needle was used to withdraw blood and flush the port with heparinized saline. Dermabond was then placed as a dressing.  I then turned my attention to the right side where the existing Port-A-Cath had a wound overlying the catheter.  The area was anesthetized copiously with 1% lidocaine.  The previous incision was reopened and the port was dissected  out with electrocautery.  The catheter and port were then freed from the surrounding tissue and removed in their entirety including the Prolene sutures that were present.  The wound was then closed with a 3-0 Vicryl and a 4-0 Monocryl.  The incision was dressed with Dermabond.  A  Xeroform was placed over the wound at the access site.  The patient tolerated the procedure well and was taken to the recovery room in stable condition.   Leotis Pain 08/11/2020 11:02 AM   This note was created with Dragon Medical transcription system. Any errors in dictation are purely unintentional.

## 2020-08-11 NOTE — Interval H&P Note (Signed)
History and Physical Interval Note:  08/11/2020 9:03 AM  Eric Simon  has presented today for surgery, with the diagnosis of Porta Cath Insertion LT  Porta Cath Removal RT    Colon Ca Covid  Aug 26.  The various methods of treatment have been discussed with the patient and family. After consideration of risks, benefits and other options for treatment, the patient has consented to  Procedure(s): PORTA CATH INSERTION (Left) PORTA CATH REMOVAL (Right) as a surgical intervention.  The patient's history has been reviewed, patient examined, no change in status, stable for surgery.  I have reviewed the patient's chart and labs.  Questions were answered to the patient's satisfaction.     Leotis Pain

## 2020-08-11 NOTE — Discharge Instructions (Signed)
Implanted Port Home Guide  An implanted port is a type of central line that is placed under the skin. Central lines are used to provide IV access when treatment or nutrition needs to be given through a person's veins. Implanted ports are used for long-term IV access. An implanted port may be placed because: You need IV medicine that would be irritating to the small veins in your hands or arms. You need long-term IV medicines, such as antibiotics. You need IV nutrition for a long period. You need frequent blood draws for lab tests. You need dialysis.   Implanted ports are usually placed in the chest area, but they can also be placed in the upper arm, the abdomen, or the leg. An implanted port has two main parts: Reservoir. The reservoir is round and will appear as a small, raised area under your skin. The reservoir is the part where a needle is inserted to give medicines or draw blood. Catheter. The catheter is a thin, flexible tube that extends from the reservoir. The catheter is placed into a large vein. Medicine that is inserted into the reservoir goes into the catheter and then into the vein.   How will I care for my incision  You may shower tomorrow  How is my port accessed? Special steps must be taken to access the port: Before the port is accessed, a numbing cream can be placed on the skin. This helps numb the skin over the port site. Your health care provider uses a sterile technique to access the port. Your health care provider must put on a mask and sterile gloves. The skin over your port is cleaned carefully with an antiseptic and allowed to dry. The port is gently pinched between sterile gloves, and a needle is inserted into the port. Only "non-coring" port needles should be used to access the port. Once the port is accessed, a blood return should be checked. This helps ensure that the port is in the vein and is not clogged. If your port needs to remain accessed for a constant  infusion, a clear (transparent) bandage will be placed over the needle site. The bandage and needle will need to be changed every week, or as directed by your health care provider.   What is flushing? Flushing helps keep the port from getting clogged. Follow your health care provider's instructions on how and when to flush the port. Ports are usually flushed with saline solution or a medicine called heparin. The need for flushing will depend on how the port is used. If the port is used for intermittent medicines or blood draws, the port will need to be flushed: After medicines have been given. After blood has been drawn. As part of routine maintenance. If a constant infusion is running, the port may not need to be flushed.   How long will my port stay implanted? The port can stay in for as long as your health care provider thinks it is needed. When it is time for the port to come out, surgery will be done to remove it. The procedure is similar to the one performed when the port was put in. When should I seek immediate medical care? When you have an implanted port, you should seek immediate medical care if: You notice a bad smell coming from the incision site. You have swelling, redness, or drainage at the incision site. You have more swelling or pain at the port site or the surrounding area. You have a fever that   is not controlled with medicine.   This information is not intended to replace advice given to you by your health care provider. Make sure you discuss any questions you have with your health care provider. Document Released: 11/29/2005 Document Revised: 05/06/2016 Document Reviewed: 08/06/2013 Elsevier Interactive Patient Education  2017 [employer] Inc.    

## 2020-08-12 ENCOUNTER — Other Ambulatory Visit: Payer: Self-pay | Admitting: Oncology

## 2020-08-14 ENCOUNTER — Other Ambulatory Visit: Payer: Self-pay | Admitting: *Deleted

## 2020-08-14 DIAGNOSIS — C772 Secondary and unspecified malignant neoplasm of intra-abdominal lymph nodes: Secondary | ICD-10-CM

## 2020-08-14 DIAGNOSIS — C189 Malignant neoplasm of colon, unspecified: Secondary | ICD-10-CM

## 2020-08-19 ENCOUNTER — Inpatient Hospital Stay: Payer: Medicare HMO

## 2020-08-19 ENCOUNTER — Other Ambulatory Visit: Payer: Self-pay | Admitting: *Deleted

## 2020-08-19 ENCOUNTER — Inpatient Hospital Stay: Payer: Medicare HMO | Admitting: Oncology

## 2020-08-19 ENCOUNTER — Inpatient Hospital Stay (HOSPITAL_BASED_OUTPATIENT_CLINIC_OR_DEPARTMENT_OTHER): Payer: Medicare HMO | Admitting: Oncology

## 2020-08-19 ENCOUNTER — Encounter: Payer: Self-pay | Admitting: Oncology

## 2020-08-19 ENCOUNTER — Other Ambulatory Visit: Payer: Self-pay

## 2020-08-19 ENCOUNTER — Inpatient Hospital Stay: Payer: Medicare HMO | Attending: Oncology

## 2020-08-19 VITALS — BP 133/71 | HR 71 | Temp 96.8°F | Resp 16 | Wt 147.0 lb

## 2020-08-19 DIAGNOSIS — C189 Malignant neoplasm of colon, unspecified: Secondary | ICD-10-CM

## 2020-08-19 DIAGNOSIS — Z5111 Encounter for antineoplastic chemotherapy: Secondary | ICD-10-CM | POA: Insufficient documentation

## 2020-08-19 DIAGNOSIS — C772 Secondary and unspecified malignant neoplasm of intra-abdominal lymph nodes: Secondary | ICD-10-CM

## 2020-08-19 DIAGNOSIS — Z5112 Encounter for antineoplastic immunotherapy: Secondary | ICD-10-CM | POA: Insufficient documentation

## 2020-08-19 DIAGNOSIS — G620 Drug-induced polyneuropathy: Secondary | ICD-10-CM | POA: Insufficient documentation

## 2020-08-19 DIAGNOSIS — C187 Malignant neoplasm of sigmoid colon: Secondary | ICD-10-CM | POA: Diagnosis present

## 2020-08-19 DIAGNOSIS — C786 Secondary malignant neoplasm of retroperitoneum and peritoneum: Secondary | ICD-10-CM | POA: Insufficient documentation

## 2020-08-19 DIAGNOSIS — T451X5A Adverse effect of antineoplastic and immunosuppressive drugs, initial encounter: Secondary | ICD-10-CM

## 2020-08-19 DIAGNOSIS — Z5189 Encounter for other specified aftercare: Secondary | ICD-10-CM | POA: Insufficient documentation

## 2020-08-19 DIAGNOSIS — Z452 Encounter for adjustment and management of vascular access device: Secondary | ICD-10-CM | POA: Diagnosis not present

## 2020-08-19 LAB — CBC WITH DIFFERENTIAL/PLATELET
Abs Immature Granulocytes: 0.05 10*3/uL (ref 0.00–0.07)
Basophils Absolute: 0.1 10*3/uL (ref 0.0–0.1)
Basophils Relative: 1 %
Eosinophils Absolute: 0.2 10*3/uL (ref 0.0–0.5)
Eosinophils Relative: 2 %
HCT: 41.4 % (ref 39.0–52.0)
Hemoglobin: 14.5 g/dL (ref 13.0–17.0)
Immature Granulocytes: 0 %
Lymphocytes Relative: 20 %
Lymphs Abs: 2.5 10*3/uL (ref 0.7–4.0)
MCH: 32.1 pg (ref 26.0–34.0)
MCHC: 35 g/dL (ref 30.0–36.0)
MCV: 91.6 fL (ref 80.0–100.0)
Monocytes Absolute: 1.1 10*3/uL — ABNORMAL HIGH (ref 0.1–1.0)
Monocytes Relative: 9 %
Neutro Abs: 8.5 10*3/uL — ABNORMAL HIGH (ref 1.7–7.7)
Neutrophils Relative %: 68 %
Platelets: 152 10*3/uL (ref 150–400)
RBC: 4.52 MIL/uL (ref 4.22–5.81)
RDW: 15.5 % (ref 11.5–15.5)
WBC: 12.5 10*3/uL — ABNORMAL HIGH (ref 4.0–10.5)
nRBC: 0 % (ref 0.0–0.2)

## 2020-08-19 LAB — COMPREHENSIVE METABOLIC PANEL
ALT: 30 U/L (ref 0–44)
AST: 37 U/L (ref 15–41)
Albumin: 4 g/dL (ref 3.5–5.0)
Alkaline Phosphatase: 102 U/L (ref 38–126)
Anion gap: 8 (ref 5–15)
BUN: 22 mg/dL (ref 8–23)
CO2: 24 mmol/L (ref 22–32)
Calcium: 8.9 mg/dL (ref 8.9–10.3)
Chloride: 101 mmol/L (ref 98–111)
Creatinine, Ser: 0.76 mg/dL (ref 0.61–1.24)
GFR calc Af Amer: >60 mL/min (ref >60)
GFR calc non Af Amer: >60 mL/min (ref >60)
Glucose, Bld: 88 mg/dL (ref 70–99)
Potassium: 4.2 mmol/L (ref 3.5–5.1)
Sodium: 133 mmol/L — ABNORMAL LOW (ref 135–145)
Total Bilirubin: 0.8 mg/dL (ref 0.3–1.2)
Total Protein: 7.3 g/dL (ref 6.5–8.1)

## 2020-08-19 LAB — PROTEIN, URINE, RANDOM: Total Protein, Urine: 20 mg/dL

## 2020-08-19 MED ORDER — DEXAMETHASONE SODIUM PHOSPHATE 10 MG/ML IJ SOLN
10.0000 mg | Freq: Once | INTRAMUSCULAR | Status: AC
  Administered 2020-08-19: 10 mg via INTRAVENOUS
  Filled 2020-08-19: qty 1

## 2020-08-19 MED ORDER — LEUCOVORIN CALCIUM INJECTION 350 MG
700.0000 mg | Freq: Once | INTRAVENOUS | Status: AC
  Administered 2020-08-19: 700 mg via INTRAVENOUS
  Filled 2020-08-19: qty 10

## 2020-08-19 MED ORDER — SODIUM CHLORIDE 0.9% FLUSH
10.0000 mL | Freq: Once | INTRAVENOUS | Status: AC
  Administered 2020-08-19: 10 mL via INTRAVENOUS
  Filled 2020-08-19: qty 10

## 2020-08-19 MED ORDER — FLUOROURACIL CHEMO INJECTION 2.5 GM/50ML
400.0000 mg/m2 | Freq: Once | INTRAVENOUS | Status: AC
  Administered 2020-08-19: 700 mg via INTRAVENOUS
  Filled 2020-08-19: qty 14

## 2020-08-19 MED ORDER — SODIUM CHLORIDE 0.9 % IV SOLN
7.5000 mg/kg | Freq: Once | INTRAVENOUS | Status: AC
  Administered 2020-08-19: 500 mg via INTRAVENOUS
  Filled 2020-08-19: qty 16

## 2020-08-19 MED ORDER — SODIUM CHLORIDE 0.9 % IV SOLN
2400.0000 mg/m2 | INTRAVENOUS | Status: DC
  Administered 2020-08-19: 4300 mg via INTRAVENOUS
  Filled 2020-08-19: qty 86

## 2020-08-19 MED ORDER — SODIUM CHLORIDE 0.9 % IV SOLN
Freq: Once | INTRAVENOUS | Status: AC
  Filled 2020-08-19: qty 250

## 2020-08-19 NOTE — Progress Notes (Signed)
Hematology/Oncology Consult note [facility]  [phone] [phone]  Patient Care Team: Jodi Marble, MD as PCP - General (Internal Medicine) Clent Jacks, RN as Oncology Nurse Navigator   Name of the patient: Eric Simon  [id number]  1951-07-18   Date of visit: 08/19/20  Diagnosis- metastatic colon cancer with lymph node metastases K-ras/BRAF wild-type  Chief complaint/ Reason for visit-on treatment assessment prior to cycle 23 of 5-FU Mvasi chemotherapy  Heme/Onc history: Patient is a68yrold male with >40 pack year history of smoking. He currently smokes 0.5ppd. he presented to the ER with symptoms of left sided chest pain and left arm pain. Troponin was negative ekg was unremarkable. Ct chest showed no PE. He was incidentally noted to have mediastinal and retrocrural adenopathy and a 2.1X2.3X4.4 cm retroaortic soft tissue lesion in the posterior left chest. 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 Low-grade activity in the left lateral fifth and sixth ribs associated with nondisplaced fractures likely reflecting healing response problem malignancy.  Patient underwent CT-guided biopsy of the retroperitoneal lymph node pathology showed metastatic adenocarcinoma compatible with colorectal origin. CK7 negative. CK20 positive. CDX 2+. TTF-1 negative. PSA negative. This pattern of immunoreactivity supports the above diagnosis. Patient underwent colonoscopy which showed sigmoid mass that was consistent with adenocarcinoma. RASpanel testing showed that he was wild-type for both K-ras and BRAF  FOLFOX and bevacizumab chemotherapy started in July 2020.  Subsequently oxaliplatin has been on hold given neuropathy.   Interval history-right-sided port was taken out given ongoing tenderness as well as  possible infection.  New left-sided port was placed.  Patient has significant bruising around the port removal site and reports some soreness over the new port site.  Otherwise tolerating chemotherapy well without any significant side effects.  ECOG PS- 1 Pain scale- 0   Review of systems- Review of Systems  Constitutional: Negative for chills, fever, malaise/fatigue and weight loss.  HENT: Negative for congestion, ear discharge and nosebleeds.   Eyes: Negative for blurred vision.  Respiratory: Negative for cough, hemoptysis, sputum production, shortness of breath and wheezing.   Cardiovascular: Negative for chest pain, palpitations, orthopnea and claudication.  Gastrointestinal: Negative for abdominal pain, blood in stool, constipation, diarrhea, heartburn, melena, nausea and vomiting.  Genitourinary: Negative for dysuria, flank pain, frequency, hematuria and urgency.  Musculoskeletal: Negative for back pain, joint pain and myalgias.  Skin: Negative for rash.  Neurological: Negative for dizziness, tingling, focal weakness, seizures, weakness and headaches.  Endo/Heme/Allergies: Does not bruise/bleed easily.  Psychiatric/Behavioral: Negative for depression and suicidal ideas. The patient does not have insomnia.       No Known Allergies   Past Medical History:  Diagnosis Date  . [place] cancer ([place]   . [place]   . Hyperlipidemia      Past Surgical History:  Procedure Laterality Date  . COLONOSCOPY WITH PROPOFOL N/A 06/26/2019   Procedure: COLONOSCOPY WITH PROPOFOL;  Surgeon: AJonathon Bellows MD;  [facility];  Service: Gastroenterology;  Laterality: N/A;  . PORTA CATH INSERTION N/A 06/25/2019   Procedure: PORTA CATH INSERTION;  Surgeon: DAlgernon Huxley MD;  [facility];  Service: Cardiovascular;  Laterality: N/A;  . PORTA CATH INSERTION Left 08/11/2020   Procedure: PORTA CATH INSERTION;  Surgeon: DAlgernon Huxley MD;   [facility];  Service: Cardiovascular;  Laterality:  Left;  . PORTA CATH REMOVAL Right 08/11/2020   Procedure: PORTA CATH REMOVAL;  Surgeon: Algernon Huxley, MD;  Location: [place] [facility];  Service: Cardiovascular;  Laterality: Right;    Social History   Socioeconomic History  . Marital status: Single    Spouse name: Not on file  . Number of children: Not on file  . Years of education: Not on file  . Highest education level: Not on file  Occupational History  . Not on file  Tobacco Use  . Smoking status: Current Every Day Smoker    Packs/day: 0.50    Years: 40.00    Pack years: 20.00    Types: Cigarettes  . Smokeless tobacco: Never Used  . Tobacco comment: smoking 40 years  Vaping Use  . Vaping Use: Never used  Substance and Sexual Activity  . Alcohol use: No  . Drug use: No  . Sexual activity: Not Currently  Other Topics Concern  . Not on file  Social History Narrative  . Not on file   Social Determinants of Health   Financial Resource Strain:   . Difficulty of Paying Living Expenses: Not on file  Food Insecurity:   . Worried About Charity fundraiser in the Last Year: Not on file  . Ran Out of Food in the Last Year: Not on file  Transportation Needs:   . Lack of Transportation (Medical): Not on file  . Lack of Transportation (Non-Medical): Not on file  Physical Activity:   . Days of Exercise per Week: Not on file  . Minutes of Exercise per Session: Not on file  Stress:   . Feeling of Stress : Not on file  Social Connections:   . Frequency of Communication with Friends and Family: Not on file  . Frequency of Social Gatherings with Friends and Family: Not on file  . Attends Religious Services: Not on file  . Active Member of Clubs or Organizations: Not on file  . Attends Archivist Meetings: Not on file  . Marital Status: Not on file  Intimate Partner Violence:   . Fear of Current or Ex-Partner: Not on file  . Emotionally  Abused: Not on file  . Physically Abused: Not on file  . Sexually Abused: Not on file    Family History  Problem Relation Age of Onset  . Brain cancer Father      Current Outpatient Medications:  .  aspirin EC 81 MG tablet, Take 81 mg by mouth daily., Disp: , Rfl:  .  azelastine (ASTELIN) 0.1 % nasal spray, Place 1 spray into both nostrils 1 day or 1 dose., Disp: , Rfl:  .  cephALEXin (KEFLEX) 500 MG capsule, Take 1 capsule (500 mg total) by mouth 4 (four) times daily., Disp: 28 capsule, Rfl: 0 .  cetirizine (ZYRTEC) 10 MG tablet, Take 1 tablet (10 mg total) by mouth daily. (Patient taking differently: Take 10 mg by mouth daily as needed. ), Disp: 30 tablet, Rfl: 0 .  chlorthalidone (HYGROTON) 25 MG tablet, Take 1 tablet by mouth daily., Disp: , Rfl:  .  dexamethasone (DECADRON) 4 MG tablet, Take 2 tablets (8 mg total) by mouth daily. Start the day after chemotherapy for 2 days. Take with food., Disp: 30 tablet, Rfl: 1 .  DEXILANT 60 MG capsule, Take 1 capsule by mouth 1 day or 1 dose., Disp: , Rfl:  .  lidocaine-prilocaine (EMLA) cream, Apply to affected area once, Disp: 30 g, Rfl: 3 .  magic mouthwash w/lidocaine SOLN, Take 5-10 mLs by mouth 4 (four) times daily as needed for mouth pain., Disp: 480 mL, Rfl: 3 .  montelukast (SINGULAIR) 10 MG tablet, Take 10 mg by mouth at bedtime. , Disp: , Rfl:  .  ondansetron (ZOFRAN) 8 MG tablet, Take 1 tablet (8 mg total) by mouth 2 (two) times daily as needed for refractory nausea / vomiting. Start on day 3 after chemotherapy., Disp: 30 tablet, Rfl: 1 .  pregabalin (LYRICA) 75 MG capsule, Take 1 capsule (75 mg total) by mouth 2 (two) times daily., Disp: 60 capsule, Rfl: 2 .  prochlorperazine (COMPAZINE) 10 MG tablet, Take 1 tablet (10 mg total) by mouth every 6 (six) hours as needed (Nausea or vomiting)., Disp: 30 tablet, Rfl: 1 .  DULoxetine (CYMBALTA) 30 MG capsule, Take 1 capsule (30 mg total) by mouth 2 (two) times daily. (Patient not taking:  Reported on 08/11/2020), Disp: 60 capsule, Rfl: 1 .  fluticasone (FLONASE) 50 MCG/ACT nasal spray, Place 2 sprays into both nostrils 1 day or 1 dose. (Patient not taking: Reported on 08/11/2020), Disp: , Rfl:  .  fluticasone (VERAMYST) 27.5 MCG/SPRAY nasal spray, Place 2 sprays into the nose daily. (Patient not taking: Reported on 08/11/2020), Disp: , Rfl:  .  nicotine (NICODERM CQ - DOSED IN MG/24 HOURS) 21 mg/24hr patch, Place 1 patch onto the skin 1 day or 1 dose. (Patient not taking: Reported on 08/11/2020), Disp: , Rfl:  .  oxyCODONE (OXY IR/ROXICODONE) 5 MG immediate release tablet, Take 1 tablet (5 mg total) by mouth every 6 (six) hours as needed for severe pain. (Patient not taking: Reported on 08/11/2020), Disp: 30 tablet, Rfl: 0 .  pantoprazole (PROTONIX) 40 MG tablet, Take 40 mg by mouth daily. (Patient not taking: Reported on 08/11/2020), Disp: , Rfl:  .  potassium chloride SA (K-DUR) 20 MEQ tablet, Take 1 tablet (20 mEq total) by mouth daily. (Patient not taking: Reported on 08/11/2020), Disp: 14 tablet, Rfl: 0 .  simvastatin (ZOCOR) 20 MG tablet, Take 20 mg by mouth daily. (Patient not taking: Reported on 08/11/2020), Disp: , Rfl:  .  sucralfate (CARAFATE) 1 g tablet, Take 1 g by mouth 4 (four) times daily. (Patient not taking: Reported on 08/11/2020), Disp: , Rfl:  .  SYMBICORT 80-4.5 MCG/ACT inhaler, Inhale 2 puffs into the lungs 1 day or 1 dose. (Patient not taking: Reported on 08/11/2020), Disp: , Rfl:  .  tamsulosin (FLOMAX) 0.4 MG CAPS capsule, Take 0.4 mg by mouth. (Patient not taking: Reported on 08/11/2020), Disp: , Rfl:  No current facility-administered medications for this visit.  Facility-Administered Medications Ordered in Other Visits:  .  fluorouracil (ADRUCIL) 4,300 mg in sodium chloride 0.9 % 64 mL chemo infusion, 2,400 mg/m2 (Treatment Plan Recorded), Intravenous, 1 day or 1 dose, Sindy Guadeloupe, MD, 4,300 mg at 08/19/20 1250 .  sodium chloride flush (NS) 0.9 % injection 10 mL, 10  mL, Intracatheter, PRN, Sindy Guadeloupe, MD, 10 mL at 08/15/19 1405  Physical exam:  Vitals:   08/19/20 0958  BP: 133/71  Pulse: 71  Resp: 16  Temp: (!) 96.8 F (36 C)  TempSrc: Tympanic  SpO2: 100%  Weight: 147 lb (66.7 kg)   Physical Exam Constitutional:      General: He is not in acute distress. Cardiovascular:     Rate and Rhythm: Normal rate and regular rhythm.     Heart sounds: Normal heart sounds.  Pulmonary:     Effort: Pulmonary effort  is normal.     Breath sounds: Normal breath sounds.  Abdominal:     General: Bowel sounds are normal.     Palpations: Abdomen is soft.  Skin:    General: Skin is warm and dry.  Neurological:     Mental Status: He is alert and oriented to person, place, and time.   There is bruising noted over right chest wall port removal site.  Skin incision appears well opposed without any infection or discharge.  There is a left-sided chest wall port in place which is functioning well.  CMP Latest Ref Rng & Units 08/19/2020  Glucose 70 - 99 mg/dL 88  BUN 8 - 23 mg/dL 22  Creatinine 0.61 - 1.24 mg/dL 0.76  Sodium 135 - 145 mmol/L 133(L)  Potassium 3.5 - 5.1 mmol/L 4.2  Chloride 98 - 111 mmol/L 101  CO2 22 - 32 mmol/L 24  Calcium 8.9 - 10.3 mg/dL 8.9  Total Protein 6.5 - 8.1 g/dL 7.3  Total Bilirubin 0.3 - 1.2 mg/dL 0.8  Alkaline Phos 38 - 126 U/L 102  AST 15 - 41 U/L 37  ALT 0 - 44 U/L 30   CBC Latest Ref Rng & Units 08/19/2020  WBC 4.0 - 10.5 K/uL 12.5(H)  Hemoglobin 13.0 - 17.0 g/dL 14.5  Hematocrit 39 - 52 % 41.4  Platelets 150 - 400 K/uL 152    No images are attached to the encounter.  PERIPHERAL VASCULAR CATHETERIZATION  Result Date: 08/11/2020 See op note    Assessment and plan- Patient is a 69 y.o. male with metastatic colon cancer TX NX M1 with generalized lymphadenopathy K-ras/beta wild type.  He is here for on treatment assessment prior to cycle 23 of 5-FU Mvasi chemotherapy  Counts okay to proceed with cycle 23 of 5-FU  Mvasi chemotherapy today.  He is tolerating treatment well without any significant side effects.  Repeat scans are due next week.  I will see him back in 3 weeks with CBC with differential, CMP and urine protein for cycle 24.  His old port has been removed and a new port has been placed.  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   Visit Diagnosis 1. Encounter for antineoplastic chemotherapy   2. Encounter for monoclonal antibody treatment for malignancy   3. 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 ([place])   4. Chemotherapy-induced peripheral neuropathy ([place])      Dr. Randa Evens, MD, MPH [facility] [phone] 08/19/2020 12:55 PM

## 2020-08-19 NOTE — Progress Notes (Signed)
Today's protein, urine, random results pending. 07/28/2020 protein, urine, random result: 18. MD, Dr. [name], notified and aware. Per MD order: reference protein, urine, random result from 07/28/2020 and proceed with scheduled MVASI, Leucovorin, and Fluorouracil treatment at this time.

## 2020-08-20 ENCOUNTER — Other Ambulatory Visit: Payer: Self-pay

## 2020-08-20 ENCOUNTER — Encounter (INDEPENDENT_AMBULATORY_CARE_PROVIDER_SITE_OTHER): Payer: Self-pay | Admitting: Nurse Practitioner

## 2020-08-20 ENCOUNTER — Ambulatory Visit (INDEPENDENT_AMBULATORY_CARE_PROVIDER_SITE_OTHER): Payer: Medicare HMO | Admitting: Nurse Practitioner

## 2020-08-20 VITALS — BP 155/79 | HR 75 | Ht 67.0 in | Wt 145.0 lb

## 2020-08-20 DIAGNOSIS — T829XXA Unspecified complication of cardiac and vascular prosthetic device, implant and graft, initial encounter: Secondary | ICD-10-CM

## 2020-08-21 ENCOUNTER — Encounter (INDEPENDENT_AMBULATORY_CARE_PROVIDER_SITE_OTHER): Payer: Self-pay | Admitting: Nurse Practitioner

## 2020-08-21 ENCOUNTER — Inpatient Hospital Stay: Payer: Medicare HMO

## 2020-08-21 DIAGNOSIS — C189 Malignant neoplasm of colon, unspecified: Secondary | ICD-10-CM

## 2020-08-21 DIAGNOSIS — Z5112 Encounter for antineoplastic immunotherapy: Secondary | ICD-10-CM | POA: Diagnosis not present

## 2020-08-21 DIAGNOSIS — C772 Secondary and unspecified malignant neoplasm of intra-abdominal lymph nodes: Secondary | ICD-10-CM

## 2020-08-21 MED ORDER — SODIUM CHLORIDE 0.9% FLUSH
10.0000 mL | INTRAVENOUS | Status: DC | PRN
  Administered 2020-08-21: 10 mL
  Filled 2020-08-21: qty 10

## 2020-08-21 MED ORDER — PEGFILGRASTIM-CBQV 6 MG/0.6ML ~~LOC~~ SOSY
6.0000 mg | PREFILLED_SYRINGE | Freq: Once | SUBCUTANEOUS | Status: AC
  Administered 2020-08-21: 6 mg via SUBCUTANEOUS
  Filled 2020-08-21: qty 0.6

## 2020-08-21 MED ORDER — HEPARIN SOD (PORK) LOCK FLUSH 100 UNIT/ML IV SOLN
INTRAVENOUS | Status: AC
  Filled 2020-08-21: qty 5

## 2020-08-21 MED ORDER — HEPARIN SOD (PORK) LOCK FLUSH 100 UNIT/ML IV SOLN
500.0000 [IU] | Freq: Once | INTRAVENOUS | Status: AC | PRN
  Administered 2020-08-21: 500 [IU]
  Filled 2020-08-21: qty 5

## 2020-08-21 NOTE — Progress Notes (Signed)
Subjective:    Patient ID: Eric Simon, male    DOB: Oct 12, 1951, 69 y.o.   MRN: [id number] Chief Complaint  Patient presents with  . Wound Check    1 week f/u [facility]  wound check    The patient presents today for evaluation of his Port-A-Cath removal site in his right chest as well as the new Port-A-Cath insertion in his left chest.  The patient noted that the area had some bruising and that it had some oozing bleeding for several days however that is stopped now.  The wound is clean dry and intact.  The glue that is in place is intact.  There is some evidence of dried blood around the wound.  No obvious signs of infection.  The left Port-A-Cath is currently accessed as the patient had chemotherapy the day before.  Overall the patient notes that it is sore but improving.  He denies any fever, chills, nausea, vomiting or diarrhea.  Overall he has done well post intervention.   Review of Systems  Skin: Positive for wound.  All other systems reviewed and are negative.      Objective:   Physical Exam Vitals reviewed.  HENT:     Head: Normocephalic.  Cardiovascular:     Rate and Rhythm: Normal rate and regular rhythm.     Pulses: Normal pulses.     Heart sounds: Normal heart sounds.  Pulmonary:     Effort: Pulmonary effort is normal.  Skin:    General: Skin is warm and dry.  Neurological:     Mental Status: He is alert and oriented to person, place, and time.  Psychiatric:        Mood and Affect: Mood normal.        Behavior: Behavior normal.        Thought Content: Thought content normal.        Judgment: Judgment normal.     BP (!) 155/79   Pulse 75   Ht 5\' 7"  (1.702 m)   Wt 145 lb (65.8 kg)   BMI 22.71 kg/m   Past Medical History:  Diagnosis Date  . Colon cancer ([place])   . COPD (chronic obstructive pulmonary disease) ([place])   . Hyperlipidemia     Social History   Socioeconomic History  . Marital status: Single    Spouse name: Not on file  . Number of  children: Not on file  . Years of education: Not on file  . Highest education level: Not on file  Occupational History  . Not on file  Tobacco Use  . Smoking status: Current Every Day Smoker    Packs/day: 0.50    Years: 40.00    Pack years: 20.00    Types: Cigarettes  . Smokeless tobacco: Never Used  . Tobacco comment: smoking 40 years  Vaping Use  . Vaping Use: Never used  Substance and Sexual Activity  . Alcohol use: No  . Drug use: No  . Sexual activity: Not Currently  Other Topics Concern  . Not on file  Social History Narrative  . Not on file   Social Determinants of Health   Financial Resource Strain:   . Difficulty of Paying Living Expenses: Not on file  Food Insecurity:   . Worried About Charity fundraiser in the Last Year: Not on file  . Ran Out of Food in the Last Year: Not on file  Transportation Needs:   . Lack of Transportation (Medical): Not on file  .  Lack of Transportation (Non-Medical): Not on file  Physical Activity:   . Days of Exercise per Week: Not on file  . Minutes of Exercise per Session: Not on file  Stress:   . Feeling of Stress : Not on file  Social Connections:   . Frequency of Communication with Friends and Family: Not on file  . Frequency of Social Gatherings with Friends and Family: Not on file  . Attends Religious Services: Not on file  . Active Member of Clubs or Organizations: Not on file  . Attends Archivist Meetings: Not on file  . Marital Status: Not on file  Intimate Partner Violence:   . Fear of Current or Ex-Partner: Not on file  . Emotionally Abused: Not on file  . Physically Abused: Not on file  . Sexually Abused: Not on file    Past Surgical History:  Procedure Laterality Date  . COLONOSCOPY WITH PROPOFOL N/A 06/26/2019   Procedure: COLONOSCOPY WITH PROPOFOL;  Surgeon: [name], MD;  [facility] ENDOSCOPY;  Service: Gastroenterology;  Laterality: N/A;  . PORTA CATH INSERTION N/A 06/25/2019    Procedure: PORTA CATH INSERTION;  Surgeon: Algernon Huxley, MD;  Location: [place] CV LAB;  Service: Cardiovascular;  Laterality: N/A;  . PORTA CATH INSERTION Left 08/11/2020   Procedure: PORTA CATH INSERTION;  Surgeon: Algernon Huxley, MD;  Location: [place] CV LAB;  Service: Cardiovascular;  Laterality: Left;  . PORTA CATH REMOVAL Right 08/11/2020   Procedure: PORTA CATH REMOVAL;  Surgeon: Algernon Huxley, MD;  Location: [place] CV LAB;  Service: Cardiovascular;  Laterality: Right;    Family History  Problem Relation Age of Onset  . Brain cancer Father     No Known Allergies     Assessment & Plan:   1. Vascular port complication, initial encounter The patient's port removal site is mostly healed.  The area is clean dry and intact with no open oozing or bleeding.  The new Port-A-Cath in the left chest currently is accessed but shows no signs symptoms of infection.  Patient will continue to follow with oncology for changes and observation of his PermCath.  Patient will follow up on an as-needed basis.  Patient is advised to contact her office if the wound opens or develops an open area or starts to have purulent drainage.   Current Outpatient Medications on File Prior to Visit  Medication Sig Dispense Refill  . aspirin EC 81 MG tablet Take 81 mg by mouth daily.    [name] azelastine (ASTELIN) 0.1 % nasal spray Place 1 spray into both nostrils 1 day or 1 dose.    . butalbital-acetaminophen-caffeine (FIORICET) 50-325-40 MG tablet Take 1 tablet by mouth every 4 (four) hours as needed.    . cephALEXin (KEFLEX) 500 MG capsule Take 1 capsule (500 mg total) by mouth 4 (four) times daily. 28 capsule 0  . cetirizine (ZYRTEC) 10 MG tablet Take 1 tablet (10 mg total) by mouth daily. (Patient taking differently: Take 10 mg by mouth daily as needed. ) 30 tablet 0  . chlorthalidone (HYGROTON) 25 MG tablet Take 1 tablet by mouth daily.    [name] dexamethasone (DECADRON) 4 MG tablet Take 2 tablets (8 mg  total) by mouth daily. Start the day after chemotherapy for 2 days. Take with food. 30 tablet 1  . DEXILANT 60 MG capsule Take 1 capsule by mouth 1 day or 1 dose.    . DULoxetine (CYMBALTA) 30 MG capsule Take 1 capsule (30  mg total) by mouth 2 (two) times daily. 60 capsule 1  . fluticasone (FLONASE) 50 MCG/ACT nasal spray Place 2 sprays into both nostrils 1 day or 1 dose.     . fluticasone (VERAMYST) 27.5 MCG/SPRAY nasal spray Place 2 sprays into the nose daily.     [name] lidocaine-prilocaine (EMLA) cream Apply to affected area once 30 g 3  . magic mouthwash w/lidocaine SOLN Take 5-10 mLs by mouth 4 (four) times daily as needed for mouth pain. 480 mL 3  . montelukast (SINGULAIR) 10 MG tablet Take 10 mg by mouth at bedtime.     . nicotine (NICODERM CQ - DOSED IN MG/24 HOURS) 21 mg/24hr patch Place 1 patch onto the skin 1 day or 1 dose.     . ondansetron (ZOFRAN) 8 MG tablet Take 1 tablet (8 mg total) by mouth 2 (two) times daily as needed for refractory nausea / vomiting. Start on day 3 after chemotherapy. 30 tablet 1  . oxyCODONE (OXY IR/ROXICODONE) 5 MG immediate release tablet Take 1 tablet (5 mg total) by mouth every 6 (six) hours as needed for severe pain. 30 tablet 0  . pantoprazole (PROTONIX) 40 MG tablet Take 40 mg by mouth daily.     . potassium chloride SA (K-DUR) 20 MEQ tablet Take 1 tablet (20 mEq total) by mouth daily. 14 tablet 0  . pregabalin (LYRICA) 75 MG capsule Take 1 capsule (75 mg total) by mouth 2 (two) times daily. 60 capsule 2  . prochlorperazine (COMPAZINE) 10 MG tablet Take 1 tablet (10 mg total) by mouth every 6 (six) hours as needed (Nausea or vomiting). 30 tablet 1  . simvastatin (ZOCOR) 20 MG tablet Take 20 mg by mouth daily.     . sucralfate (CARAFATE) 1 g tablet Take 1 g by mouth 4 (four) times daily.     . SYMBICORT 80-4.5 MCG/ACT inhaler Inhale 2 puffs into the lungs 1 day or 1 dose.     . tamsulosin (FLOMAX) 0.4 MG CAPS capsule Take 0.4 mg by mouth.      Current  Facility-Administered Medications on File Prior to Visit  Medication Dose Route Frequency Provider Last Rate Last Admin  . sodium chloride flush (NS) 0.9 % injection 10 mL  10 mL Intracatheter PRN Sindy Guadeloupe, MD   10 mL at 08/15/19 1405    There are no Patient Instructions on file for this visit. No follow-ups on file.   Kris Hartmann, NP

## 2020-08-26 ENCOUNTER — Inpatient Hospital Stay (HOSPITAL_BASED_OUTPATIENT_CLINIC_OR_DEPARTMENT_OTHER): Payer: Medicare HMO | Admitting: Hospice and Palliative Medicine

## 2020-08-26 DIAGNOSIS — Z515 Encounter for palliative care: Secondary | ICD-10-CM

## 2020-08-26 NOTE — Progress Notes (Signed)
I was unable to reach patient for schedule my chart visit.  Will reschedule.

## 2020-08-27 ENCOUNTER — Telehealth: Payer: Medicare HMO | Admitting: Hospice and Palliative Medicine

## 2020-08-27 ENCOUNTER — Other Ambulatory Visit: Payer: Self-pay

## 2020-08-27 ENCOUNTER — Ambulatory Visit
Admission: RE | Admit: 2020-08-27 | Discharge: 2020-08-27 | Disposition: A | Discharge status: Home / Self Care | Payer: Medicare HMO | Source: Ambulatory Visit | Attending: Oncology | Admitting: Oncology

## 2020-08-27 DIAGNOSIS — C189 Malignant neoplasm of colon, unspecified: Secondary | ICD-10-CM | POA: Diagnosis present

## 2020-08-27 DIAGNOSIS — C772 Secondary and unspecified malignant neoplasm of intra-abdominal lymph nodes: Secondary | ICD-10-CM | POA: Diagnosis present

## 2020-08-27 MED ORDER — IOHEXOL 300 MG/ML  SOLN
100.0000 mL | Freq: Once | INTRAMUSCULAR | Status: AC | PRN
  Administered 2020-08-27: 80 mL via INTRAVENOUS

## 2020-09-05 ENCOUNTER — Other Ambulatory Visit: Payer: Self-pay | Admitting: *Deleted

## 2020-09-05 DIAGNOSIS — C189 Malignant neoplasm of colon, unspecified: Secondary | ICD-10-CM

## 2020-09-05 DIAGNOSIS — C772 Secondary and unspecified malignant neoplasm of intra-abdominal lymph nodes: Secondary | ICD-10-CM

## 2020-09-05 DIAGNOSIS — Z5111 Encounter for antineoplastic chemotherapy: Secondary | ICD-10-CM

## 2020-09-08 ENCOUNTER — Encounter: Payer: Self-pay | Admitting: Oncology

## 2020-09-08 ENCOUNTER — Inpatient Hospital Stay (HOSPITAL_BASED_OUTPATIENT_CLINIC_OR_DEPARTMENT_OTHER): Payer: Medicare HMO | Admitting: Oncology

## 2020-09-08 ENCOUNTER — Inpatient Hospital Stay: Payer: Medicare HMO

## 2020-09-08 ENCOUNTER — Other Ambulatory Visit: Payer: Self-pay

## 2020-09-08 VITALS — BP 116/76 | HR 79 | Temp 97.5°F | Resp 16 | Ht 67.0 in | Wt 144.7 lb

## 2020-09-08 DIAGNOSIS — C772 Secondary and unspecified malignant neoplasm of intra-abdominal lymph nodes: Secondary | ICD-10-CM

## 2020-09-08 DIAGNOSIS — Z5111 Encounter for antineoplastic chemotherapy: Secondary | ICD-10-CM | POA: Diagnosis not present

## 2020-09-08 DIAGNOSIS — Z5112 Encounter for antineoplastic immunotherapy: Secondary | ICD-10-CM | POA: Diagnosis not present

## 2020-09-08 DIAGNOSIS — C189 Malignant neoplasm of colon, unspecified: Secondary | ICD-10-CM

## 2020-09-08 LAB — COMPREHENSIVE METABOLIC PANEL
ALT: 25 U/L (ref 0–44)
AST: 32 U/L (ref 15–41)
Albumin: 4 g/dL (ref 3.5–5.0)
Alkaline Phosphatase: 94 U/L (ref 38–126)
Anion gap: 8 (ref 5–15)
BUN: 24 mg/dL — ABNORMAL HIGH (ref 8–23)
CO2: 23 mmol/L (ref 22–32)
Calcium: 8.9 mg/dL (ref 8.9–10.3)
Chloride: 102 mmol/L (ref 98–111)
Creatinine, Ser: 0.78 mg/dL (ref 0.61–1.24)
GFR calc Af Amer: >60 mL/min (ref >60)
GFR calc non Af Amer: >60 mL/min (ref >60)
Glucose, Bld: 103 mg/dL — ABNORMAL HIGH (ref 70–99)
Potassium: 3.8 mmol/L (ref 3.5–5.1)
Sodium: 133 mmol/L — ABNORMAL LOW (ref 135–145)
Total Bilirubin: 1 mg/dL (ref 0.3–1.2)
Total Protein: 7.5 g/dL (ref 6.5–8.1)

## 2020-09-08 LAB — CBC WITH DIFFERENTIAL/PLATELET
Abs Immature Granulocytes: 0.06 10*3/uL (ref 0.00–0.07)
Basophils Absolute: 0.1 10*3/uL (ref 0.0–0.1)
Basophils Relative: 1 %
Eosinophils Absolute: 0.2 10*3/uL (ref 0.0–0.5)
Eosinophils Relative: 2 %
HCT: 43.7 % (ref 39.0–52.0)
Hemoglobin: 15 g/dL (ref 13.0–17.0)
Immature Granulocytes: 1 %
Lymphocytes Relative: 22 %
Lymphs Abs: 2.5 10*3/uL (ref 0.7–4.0)
MCH: 31.9 pg (ref 26.0–34.0)
MCHC: 34.3 g/dL (ref 30.0–36.0)
MCV: 93 fL (ref 80.0–100.0)
Monocytes Absolute: 1 10*3/uL (ref 0.1–1.0)
Monocytes Relative: 9 %
Neutro Abs: 7.6 10*3/uL (ref 1.7–7.7)
Neutrophils Relative %: 65 %
Platelets: 149 10*3/uL — ABNORMAL LOW (ref 150–400)
RBC: 4.7 MIL/uL (ref 4.22–5.81)
RDW: 15.8 % — ABNORMAL HIGH (ref 11.5–15.5)
WBC: 11.4 10*3/uL — ABNORMAL HIGH (ref 4.0–10.5)
nRBC: 0 % (ref 0.0–0.2)

## 2020-09-08 MED ORDER — FLUOROURACIL CHEMO INJECTION 2.5 GM/50ML
400.0000 mg/m2 | Freq: Once | INTRAVENOUS | Status: AC
  Administered 2020-09-08: 700 mg via INTRAVENOUS
  Filled 2020-09-08: qty 14

## 2020-09-08 MED ORDER — SODIUM CHLORIDE 0.9 % IV SOLN
INTRAVENOUS | Status: DC
  Filled 2020-09-08: qty 250

## 2020-09-08 MED ORDER — SODIUM CHLORIDE 0.9 % IV SOLN
7.5000 mg/kg | Freq: Once | INTRAVENOUS | Status: AC
  Administered 2020-09-08: 500 mg via INTRAVENOUS
  Filled 2020-09-08: qty 16

## 2020-09-08 MED ORDER — SODIUM CHLORIDE 0.9 % IV SOLN
2400.0000 mg/m2 | INTRAVENOUS | Status: DC
  Administered 2020-09-08: 4300 mg via INTRAVENOUS
  Filled 2020-09-08: qty 86

## 2020-09-08 MED ORDER — SODIUM CHLORIDE 0.9 % IV SOLN
700.0000 mg | Freq: Once | INTRAVENOUS | Status: AC
  Administered 2020-09-08: 700 mg via INTRAVENOUS
  Filled 2020-09-08: qty 25

## 2020-09-08 MED ORDER — DEXAMETHASONE SODIUM PHOSPHATE 10 MG/ML IJ SOLN
10.0000 mg | Freq: Once | INTRAMUSCULAR | Status: AC
  Administered 2020-09-08: 10 mg via INTRAVENOUS
  Filled 2020-09-08: qty 1

## 2020-09-08 NOTE — Progress Notes (Signed)
Pt states he has no concerns today. He got ct scan and MD will go over results. 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

## 2020-09-09 LAB — CEA: CEA: 4.4 ng/mL (ref 0.0–4.7)

## 2020-09-09 NOTE — Progress Notes (Signed)
Hematology/Oncology Consult note [facility]  Telephone:([phone] [phone]  Patient Care Team: Jodi Marble, MD as PCP - General (Internal Medicine) Clent Jacks, RN as Oncology Nurse Navigator   Name of the patient: Eric Simon  [id number]  1951/02/04   Date of visit: 09/09/20  Diagnosis- metastatic colon cancer with lymph node metastases K-ras/BRAF wild-type  Chief complaint/ Reason for visit-on treatment assessment prior to cycle 24 of 5-FU Mvasi chemotherapy  Heme/Onc history: Patient is a69yrold male with >40 pack year history of smoking. He currently smokes 0.5ppd. he presented to the ER with symptoms of left sided chest pain and left arm pain. Troponin was negative ekg was unremarkable. Ct chest showed no PE. He was incidentally noted to have mediastinal and retrocrural adenopathy and a 2.1X2.3X4.4 cm retroaortic soft tissue lesion in the posterior left chest. 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 Low-grade activity in the left lateral fifth and sixth ribs associated with nondisplaced fractures likely reflecting healing response problem malignancy.  Patient underwent CT-guided biopsy of the retroperitoneal lymph node pathology showed metastatic adenocarcinoma compatible with colorectal origin. CK7 negative. CK20 positive. CDX 2+. TTF-1 negative. PSA negative. This pattern of immunoreactivity supports the above diagnosis. Patient underwent colonoscopy which showed sigmoid mass that was consistent with adenocarcinoma. RASpanel testing showed that he was wild-type for both K-ras and BRAF  FOLFOX and bevacizumab chemotherapy started in July 2020.  Subsequently oxaliplatin has been on hold given neuropathy.  Interval history-patient reports doing well.  Appetite and weight have remained stable.   Denies any complaints at this time.  Neuropathy in his hands and feet is currently stable  ECOG PS- 1 Pain scale- 0  Review of systems- Review of Systems  Constitutional: Negative for chills, fever, malaise/fatigue and weight loss.  HENT: Negative for congestion, ear discharge and nosebleeds.   Eyes: Negative for blurred vision.  Respiratory: Negative for cough, hemoptysis, sputum production, shortness of breath and wheezing.   Cardiovascular: Negative for chest pain, palpitations, orthopnea and claudication.  Gastrointestinal: Negative for abdominal pain, blood in stool, constipation, diarrhea, heartburn, melena, nausea and vomiting.  Genitourinary: Negative for dysuria, flank pain, frequency, hematuria and urgency.  Musculoskeletal: Negative for back pain, joint pain and myalgias.  Skin: Negative for rash.  Neurological: Negative for dizziness, tingling, focal weakness, seizures, weakness and headaches.  Endo/Heme/Allergies: Does not bruise/bleed easily.  Psychiatric/Behavioral: Negative for depression and suicidal ideas. The patient does not have insomnia.       No Known Allergies   Past Medical History:  Diagnosis Date  . [place]   . [place]   . Hyperlipidemia      Past Surgical History:  Procedure Laterality Date  . COLONOSCOPY WITH PROPOFOL N/A 06/26/2019   Procedure: COLONOSCOPY WITH PROPOFOL;  Surgeon: AJonathon Bellows MD;  [facility];  Service: Gastroenterology;  Laterality: N/A;  . PORTA CATH INSERTION N/A 06/25/2019   Procedure: PORTA CATH INSERTION;  Surgeon: DAlgernon Huxley MD;  [place] [facility];  Service: Cardiovascular;  Laterality: N/A;  . PORTA CATH INSERTION Left 08/11/2020   Procedure: PORTA CATH INSERTION;  Surgeon: DAlgernon Huxley MD;  [place] [facility];  Service: Cardiovascular;  Laterality: Left;  . PORTA CATH REMOVAL Right 08/11/2020   Procedure: PORTA CATH REMOVAL;   Surgeon: DAlgernon Huxley MD;  Location:  [place] CV LAB;  Service: Cardiovascular;  Laterality: Right;    Social History   Socioeconomic History  . Marital status: Single    Spouse name: Not on file  . Number of children: Not on file  . Years of education: Not on file  . Highest education level: Not on file  Occupational History  . Not on file  Tobacco Use  . Smoking status: Current Every Day Smoker    Packs/day: 0.50    Years: 40.00    Pack years: 20.00    Types: Cigarettes  . Smokeless tobacco: Never Used  . Tobacco comment: smoking 40 years  Vaping Use  . Vaping Use: Never used  Substance and Sexual Activity  . Alcohol use: No  . Drug use: No  . Sexual activity: Not Currently  Other Topics Concern  . Not on file  Social History Narrative  . Not on file   Social Determinants of Health   Financial Resource Strain:   . Difficulty of Paying Living Expenses: Not on file  Food Insecurity:   . Worried About Charity fundraiser in the Last Year: Not on file  . Ran Out of Food in the Last Year: Not on file  Transportation Needs:   . Lack of Transportation (Medical): Not on file  . Lack of Transportation (Non-Medical): Not on file  Physical Activity:   . Days of Exercise per Week: Not on file  . Minutes of Exercise per Session: Not on file  Stress:   . Feeling of Stress : Not on file  Social Connections:   . Frequency of Communication with Friends and Family: Not on file  . Frequency of Social Gatherings with Friends and Family: Not on file  . Attends Religious Services: Not on file  . Active Member of Clubs or Organizations: Not on file  . Attends Archivist Meetings: Not on file  . Marital Status: Not on file  Intimate Partner Violence:   . Fear of Current or Ex-Partner: Not on file  . Emotionally Abused: Not on file  . Physically Abused: Not on file  . Sexually Abused: Not on file    Family History  Problem Relation Age of Onset  . Brain cancer  Father      Current Outpatient Medications:  .  aspirin EC 81 MG tablet, Take 81 mg by mouth daily., Disp: , Rfl:  .  azelastine (ASTELIN) 0.1 % nasal spray, Place 1 spray into both nostrils 1 day or 1 dose., Disp: , Rfl:  .  butalbital-acetaminophen-caffeine (FIORICET) 50-325-40 MG tablet, Take 1 tablet by mouth every 4 (four) hours as needed., Disp: , Rfl:  .  cephALEXin (KEFLEX) 500 MG capsule, Take 1 capsule (500 mg total) by mouth 4 (four) times daily., Disp: 28 capsule, Rfl: 0 .  cetirizine (ZYRTEC) 10 MG tablet, Take 1 tablet (10 mg total) by mouth daily. (Patient taking differently: Take 10 mg by mouth daily as needed. ), Disp: 30 tablet, Rfl: 0 .  chlorthalidone (HYGROTON) 25 MG tablet, Take 1 tablet by mouth daily., Disp: , Rfl:  .  dexamethasone (DECADRON) 4 MG tablet, Take 2 tablets (8 mg total) by mouth daily. Start the day after chemotherapy for 2 days. Take with food., Disp: 30 tablet, Rfl: 1 .  DEXILANT 60 MG capsule, Take 1 capsule by mouth 1 day or 1 dose., Disp: , Rfl:  .  DULoxetine (CYMBALTA) 30 MG capsule, Take 1 capsule (30 mg total) by mouth 2 (  two) times daily., Disp: 60 capsule, Rfl: 1 .  fluticasone (FLONASE) 50 MCG/ACT nasal spray, Place 2 sprays into both nostrils 1 day or 1 dose. , Disp: , Rfl:  .  fluticasone (VERAMYST) 27.5 MCG/SPRAY nasal spray, Place 2 sprays into the nose daily. , Disp: , Rfl:  .  lidocaine-prilocaine (EMLA) cream, Apply to affected area once, Disp: 30 g, Rfl: 3 .  magic mouthwash w/lidocaine SOLN, Take 5-10 mLs by mouth 4 (four) times daily as needed for mouth pain., Disp: 480 mL, Rfl: 3 .  montelukast (SINGULAIR) 10 MG tablet, Take 10 mg by mouth at bedtime. , Disp: , Rfl:  .  ondansetron (ZOFRAN) 8 MG tablet, Take 1 tablet (8 mg total) by mouth 2 (two) times daily as needed for refractory nausea / vomiting. Start on day 3 after chemotherapy., Disp: 30 tablet, Rfl: 1 .  oxyCODONE (OXY IR/ROXICODONE) 5 MG immediate release tablet, Take 1 tablet  (5 mg total) by mouth every 6 (six) hours as needed for severe pain., Disp: 30 tablet, Rfl: 0 .  pantoprazole (PROTONIX) 40 MG tablet, Take 40 mg by mouth daily. , Disp: , Rfl:  .  potassium chloride SA (K-DUR) 20 MEQ tablet, Take 1 tablet (20 mEq total) by mouth daily., Disp: 14 tablet, Rfl: 0 .  pregabalin (LYRICA) 75 MG capsule, Take 1 capsule (75 mg total) by mouth 2 (two) times daily., Disp: 60 capsule, Rfl: 2 .  prochlorperazine (COMPAZINE) 10 MG tablet, Take 1 tablet (10 mg total) by mouth every 6 (six) hours as needed (Nausea or vomiting)., Disp: 30 tablet, Rfl: 1 .  simvastatin (ZOCOR) 20 MG tablet, Take 20 mg by mouth daily. , Disp: , Rfl:  .  sucralfate (CARAFATE) 1 g tablet, Take 1 g by mouth 4 (four) times daily. , Disp: , Rfl:  .  SYMBICORT 80-4.5 MCG/ACT inhaler, Inhale 2 puffs into the lungs 1 day or 1 dose. , Disp: , Rfl:  .  tamsulosin (FLOMAX) 0.4 MG CAPS capsule, Take 0.4 mg by mouth. , Disp: , Rfl:  .  nicotine (NICODERM CQ - DOSED IN MG/24 HOURS) 21 mg/24hr patch, Place 1 patch onto the skin 1 day or 1 dose.  (Patient not taking: Reported on 09/08/2020), Disp: , Rfl:  No current facility-administered medications for this visit.  Facility-Administered Medications Ordered in Other Visits:  .  sodium chloride flush (NS) 0.9 % injection 10 mL, 10 mL, Intracatheter, PRN, Sindy Guadeloupe, MD, 10 mL at 08/15/19 1405  Physical exam:  Vitals:   09/08/20 0855  BP: 116/76  Pulse: 79  Resp: 16  Temp: (!) 97.5 F (36.4 C)  TempSrc: Oral  Weight: 144 lb 11.2 oz (65.6 kg)  Height: '5\' 7"'  (1.702 m)   Physical Exam Constitutional:      General: He is not in acute distress. Cardiovascular:     Rate and Rhythm: Normal rate and regular rhythm.     Heart sounds: Normal heart sounds.  Pulmonary:     Effort: Pulmonary effort is normal.     Breath sounds: Normal breath sounds.  Abdominal:     General: Bowel sounds are normal.     Palpations: Abdomen is soft.  Skin:    General: Skin  is warm and dry.  Neurological:     Mental Status: He is alert and oriented to person, place, and time.      CMP Latest Ref Rng & Units 09/08/2020  Glucose 70 - 99 mg/dL 103(H)  BUN 8 -  23 mg/dL 24(H)  Creatinine 0.61 - 1.24 mg/dL 0.78  Sodium 135 - 145 mmol/L 133(L)  Potassium 3.5 - 5.1 mmol/L 3.8  Chloride 98 - 111 mmol/L 102  CO2 22 - 32 mmol/L 23  Calcium 8.9 - 10.3 mg/dL 8.9  Total Protein 6.5 - 8.1 g/dL 7.5  Total Bilirubin 0.3 - 1.2 mg/dL 1.0  Alkaline Phos 38 - 126 U/L 94  AST 15 - 41 U/L 32  ALT 0 - 44 U/L 25   CBC Latest Ref Rng & Units 09/08/2020  WBC 4.0 - 10.5 K/uL 11.4(H)  Hemoglobin 13.0 - 17.0 g/dL 15.0  Hematocrit 39 - 52 % 43.7  Platelets 150 - 400 K/uL 149(L)    No images are attached to the encounter.  CT CHEST ABDOMEN PELVIS W CONTRAST  Result Date: 08/27/2020 CLINICAL DATA:  Routine colon cancer surveillance. Chemotherapy ongoing. EXAM: CT CHEST, ABDOMEN, AND PELVIS WITH CONTRAST TECHNIQUE: Multidetector CT imaging of the chest, abdomen and pelvis was performed following the standard protocol during bolus administration of intravenous contrast. CONTRAST:  30m OMNIPAQUE IOHEXOL 300 MG/ML  SOLN COMPARISON:  CT 04/28/2020 FINDINGS: CT CHEST FINDINGS Cardiovascular: No significant vascular findings. Normal heart size. No pericardial effusion. Mediastinum/Nodes: No axillary or supraclavicular adenopathy. No mediastinal or hilar adenopathy. No pericardial fluid. Esophagus normal. Lungs/Pleura: No suspicious nodularity. Centrilobular emphysema the upper lobes. Musculoskeletal: No aggressive osseous lesion. CT ABDOMEN AND PELVIS FINDINGS Hepatobiliary: No focal hepatic lesion. No biliary ductal dilatation. Gallbladder is normal. Common bile duct is normal. Pancreas: Pancreas is normal. No ductal dilatation. No pancreatic inflammation. Spleen: Spleen is enlarged at 14 cm in craniocaudad dimension. No change from prior Adrenals/urinary tract: Adrenal glands and kidneys are  normal. The ureters and bladder normal. Stomach/Bowel: Stomach, small bowel, appendix, and cecum are normal. Ascending and transverse colon normal. Descending colon. Rectosigmoid colon appears normal without evidence of mass lesion. No obstruction. Vascular/Lymphatic: LEFT iliac lymph nodes continue to decrease in size. For example: Lymph node LEFT operator space measuring 10 mm compares to 11 mm. LEFT iliac node at the iliac bifurcation measuring 9 mm compares to 11 mm. Left common iliac node measures 8 mm compared to 12 mm. No periaortic adenopathy.  No RIGHT iliac adenopathy. Reproductive: Prostate normal by CT imaging. Seminal vesicles normal. Other: No peritoneal metastasis.  No peritoneal fluid. Musculoskeletal: No aggressive osseous lesion. IMPRESSION: Chest Impression: 1. No evidence of thoracic metastasis. 2. Centrilobular emphysema. Abdomen / Pelvis Impression: 1. Continued interval decrease in size of LEFT iliac lymphadenopathy. 2. No new lymphadenopathy. 3. No CT evidence of primary colorectal carcinoma. 4. No evidence of visceral metastasis or skeletal metastasis. 5. Stable splenomegaly. Electronically Signed   By: SSuzy BouchardM.D.   On: 08/27/2020 16:34   PERIPHERAL VASCULAR CATHETERIZATION  Result Date: 08/11/2020 See op note    Assessment and plan- Patient is a 69y.o. male with metastatic colon cancer TX NX M1 with generalized lymphadenopathy K-ras/beta wild type.    He is here for on treatment assessment prior to cycle 24 of 5-FU Mvasi chemotherapy  Counts okay to proceed with cycle 24 of 5-FU Mvasi chemotherapy today.  He is getting his treatment every 3 weeks for better tolerance.I reviewed recent CT chest abdomen and pelvis with contrast that was done on 08/27/2020.  He continues to respond well to treatment with reduction in the size of intra-abdominal adenopathy.  There was no CT evidence of primary colorectal lesion seen on his original CT.  Mild splenomegaly of 14 cm  essentially stable.  Plan is to continue treatment until progression or toxicity.  I will see him in 3 weeks with CBC with differential CMP and urine protein for cycle 25  Chemo-induced peripheral neuropathy: Continue Cymbalta   Visit Diagnosis 1. Encounter for antineoplastic chemotherapy   2. Encounter for monoclonal antibody treatment for malignancy   3. 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 ([place])      Dr. Randa Evens, MD, MPH [facility] [phone] 09/09/2020 11:22 AM

## 2020-09-10 ENCOUNTER — Inpatient Hospital Stay: Payer: Medicare HMO

## 2020-09-10 ENCOUNTER — Other Ambulatory Visit: Payer: Self-pay

## 2020-09-10 VITALS — BP 116/66 | HR 80 | Temp 99.1°F | Resp 16

## 2020-09-10 DIAGNOSIS — C772 Secondary and unspecified malignant neoplasm of intra-abdominal lymph nodes: Secondary | ICD-10-CM

## 2020-09-10 DIAGNOSIS — C189 Malignant neoplasm of colon, unspecified: Secondary | ICD-10-CM

## 2020-09-10 DIAGNOSIS — Z5112 Encounter for antineoplastic immunotherapy: Secondary | ICD-10-CM | POA: Diagnosis not present

## 2020-09-10 MED ORDER — SODIUM CHLORIDE 0.9% FLUSH
10.0000 mL | INTRAVENOUS | Status: DC | PRN
  Administered 2020-09-10: 10 mL
  Filled 2020-09-10: qty 10

## 2020-09-10 MED ORDER — HEPARIN SOD (PORK) LOCK FLUSH 100 UNIT/ML IV SOLN
INTRAVENOUS | Status: AC
  Filled 2020-09-10: qty 5

## 2020-09-10 MED ORDER — PEGFILGRASTIM-CBQV 6 MG/0.6ML ~~LOC~~ SOSY
6.0000 mg | PREFILLED_SYRINGE | Freq: Once | SUBCUTANEOUS | Status: AC
  Administered 2020-09-10: 6 mg via SUBCUTANEOUS
  Filled 2020-09-10: qty 0.6

## 2020-09-10 MED ORDER — HEPARIN SOD (PORK) LOCK FLUSH 100 UNIT/ML IV SOLN
500.0000 [IU] | Freq: Once | INTRAVENOUS | Status: AC | PRN
  Administered 2020-09-10: 500 [IU]
  Filled 2020-09-10: qty 5

## 2020-09-29 ENCOUNTER — Other Ambulatory Visit: Payer: Self-pay

## 2020-09-29 ENCOUNTER — Inpatient Hospital Stay: Payer: Medicare HMO

## 2020-09-29 ENCOUNTER — Encounter: Payer: Self-pay | Admitting: Oncology

## 2020-09-29 ENCOUNTER — Inpatient Hospital Stay (HOSPITAL_BASED_OUTPATIENT_CLINIC_OR_DEPARTMENT_OTHER): Payer: Medicare HMO | Admitting: Oncology

## 2020-09-29 ENCOUNTER — Inpatient Hospital Stay: Payer: Medicare HMO | Attending: Oncology

## 2020-09-29 VITALS — BP 118/75 | HR 79 | Temp 97.8°F | Resp 16 | Ht 67.0 in | Wt 146.6 lb

## 2020-09-29 DIAGNOSIS — Z452 Encounter for adjustment and management of vascular access device: Secondary | ICD-10-CM | POA: Diagnosis not present

## 2020-09-29 DIAGNOSIS — Z5189 Encounter for other specified aftercare: Secondary | ICD-10-CM | POA: Insufficient documentation

## 2020-09-29 DIAGNOSIS — C187 Malignant neoplasm of sigmoid colon: Secondary | ICD-10-CM | POA: Insufficient documentation

## 2020-09-29 DIAGNOSIS — Z5111 Encounter for antineoplastic chemotherapy: Secondary | ICD-10-CM

## 2020-09-29 DIAGNOSIS — C772 Secondary and unspecified malignant neoplasm of intra-abdominal lymph nodes: Secondary | ICD-10-CM | POA: Diagnosis not present

## 2020-09-29 DIAGNOSIS — G620 Drug-induced polyneuropathy: Secondary | ICD-10-CM | POA: Diagnosis not present

## 2020-09-29 DIAGNOSIS — C786 Secondary malignant neoplasm of retroperitoneum and peritoneum: Secondary | ICD-10-CM | POA: Insufficient documentation

## 2020-09-29 DIAGNOSIS — C189 Malignant neoplasm of colon, unspecified: Secondary | ICD-10-CM

## 2020-09-29 DIAGNOSIS — Z5112 Encounter for antineoplastic immunotherapy: Secondary | ICD-10-CM | POA: Insufficient documentation

## 2020-09-29 DIAGNOSIS — Z95828 Presence of other vascular implants and grafts: Secondary | ICD-10-CM

## 2020-09-29 LAB — COMPREHENSIVE METABOLIC PANEL
ALT: 25 U/L (ref 0–44)
AST: 36 U/L (ref 15–41)
Albumin: 4 g/dL (ref 3.5–5.0)
Alkaline Phosphatase: 98 U/L (ref 38–126)
Anion gap: 8 (ref 5–15)
BUN: 21 mg/dL (ref 8–23)
CO2: 23 mmol/L (ref 22–32)
Calcium: 9.1 mg/dL (ref 8.9–10.3)
Chloride: 101 mmol/L (ref 98–111)
Creatinine, Ser: 0.87 mg/dL (ref 0.61–1.24)
GFR, Estimated: >60 mL/min (ref >60)
Glucose, Bld: 100 mg/dL — ABNORMAL HIGH (ref 70–99)
Potassium: 4.1 mmol/L (ref 3.5–5.1)
Sodium: 132 mmol/L — ABNORMAL LOW (ref 135–145)
Total Bilirubin: 0.9 mg/dL (ref 0.3–1.2)
Total Protein: 7.8 g/dL (ref 6.5–8.1)

## 2020-09-29 LAB — CBC WITH DIFFERENTIAL/PLATELET
Abs Immature Granulocytes: 0.06 10*3/uL (ref 0.00–0.07)
Basophils Absolute: 0.1 10*3/uL (ref 0.0–0.1)
Basophils Relative: 1 %
Eosinophils Absolute: 0.1 10*3/uL (ref 0.0–0.5)
Eosinophils Relative: 1 %
HCT: 42.2 % (ref 39.0–52.0)
Hemoglobin: 14.6 g/dL (ref 13.0–17.0)
Immature Granulocytes: 1 %
Lymphocytes Relative: 21 %
Lymphs Abs: 2.2 10*3/uL (ref 0.7–4.0)
MCH: 31.7 pg (ref 26.0–34.0)
MCHC: 34.6 g/dL (ref 30.0–36.0)
MCV: 91.7 fL (ref 80.0–100.0)
Monocytes Absolute: 0.9 10*3/uL (ref 0.1–1.0)
Monocytes Relative: 9 %
Neutro Abs: 7.4 10*3/uL (ref 1.7–7.7)
Neutrophils Relative %: 67 %
Platelets: 135 10*3/uL — ABNORMAL LOW (ref 150–400)
RBC: 4.6 MIL/uL (ref 4.22–5.81)
RDW: 15.3 % (ref 11.5–15.5)
WBC: 10.7 10*3/uL — ABNORMAL HIGH (ref 4.0–10.5)
nRBC: 0 % (ref 0.0–0.2)

## 2020-09-29 LAB — PROTEIN, URINE, RANDOM: Total Protein, Urine: <6 mg/dL

## 2020-09-29 MED ORDER — SODIUM CHLORIDE 0.9 % IV SOLN
INTRAVENOUS | Status: DC
  Filled 2020-09-29: qty 250

## 2020-09-29 MED ORDER — DEXAMETHASONE SODIUM PHOSPHATE 10 MG/ML IJ SOLN
10.0000 mg | Freq: Once | INTRAMUSCULAR | Status: AC
  Administered 2020-09-29: 10 mg via INTRAVENOUS
  Filled 2020-09-29: qty 1

## 2020-09-29 MED ORDER — SODIUM CHLORIDE 0.9 % IV SOLN
7.5000 mg/kg | Freq: Once | INTRAVENOUS | Status: AC
  Administered 2020-09-29: 500 mg via INTRAVENOUS
  Filled 2020-09-29: qty 16

## 2020-09-29 MED ORDER — SODIUM CHLORIDE 0.9 % IV SOLN
2400.0000 mg/m2 | INTRAVENOUS | Status: DC
  Administered 2020-09-29: 4300 mg via INTRAVENOUS
  Filled 2020-09-29: qty 86

## 2020-09-29 MED ORDER — SODIUM CHLORIDE 0.9% FLUSH
10.0000 mL | Freq: Once | INTRAVENOUS | Status: AC
  Administered 2020-09-29: 10 mL via INTRAVENOUS
  Filled 2020-09-29: qty 10

## 2020-09-29 MED ORDER — FLUOROURACIL CHEMO INJECTION 2.5 GM/50ML
400.0000 mg/m2 | Freq: Once | INTRAVENOUS | Status: AC
  Administered 2020-09-29: 700 mg via INTRAVENOUS
  Filled 2020-09-29: qty 14

## 2020-09-29 MED ORDER — LEUCOVORIN CALCIUM INJECTION 350 MG
700.0000 mg | Freq: Once | INTRAVENOUS | Status: AC
  Administered 2020-09-29: 700 mg via INTRAVENOUS
  Filled 2020-09-29: qty 10

## 2020-09-29 NOTE — Progress Notes (Signed)
Hematology/Oncology Consult note [facility]  Telephone:([phone] [phone]  Patient Care Team: Jodi Marble, MD as PCP - General (Internal Medicine) Clent Jacks, RN as Oncology Nurse Navigator   Name of the patient: Eric Simon  [id number]  February 25, 1951   Date of visit: 09/29/20  Diagnosis- metastatic colon cancer with lymph node metastases K-ras/BRAF wild-type  Chief complaint/ Reason for visit-on treatment assessment prior to cycle 25 of 5-FU Mvasi chemotherapy  Heme/Onc history: Patient is a69yrold male with >40 pack year history of smoking. He currently smokes 0.5ppd. he presented to the ER with symptoms of left sided chest pain and left arm pain. Troponin was negative ekg was unremarkable. Ct chest showed no PE. He was incidentally noted to have mediastinal and retrocrural adenopathy and a 2.1X2.3X4.4 cm retroaortic soft tissue lesion in the posterior left chest. 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 Low-grade activity in the left lateral fifth and sixth ribs associated with nondisplaced fractures likely reflecting healing response problem malignancy.  Patient underwent CT-guided biopsy of the retroperitoneal lymph node pathology showed metastatic adenocarcinoma compatible with colorectal origin. CK7 negative. CK20 positive. CDX 2+. TTF-1 negative. PSA negative. This pattern of immunoreactivity supports the above diagnosis. Patient underwent colonoscopy which showed sigmoid mass that was consistent with adenocarcinoma. RASpanel testing showed that he was wild-type for both K-ras and BRAF  FOLFOX and bevacizumab chemotherapy started in July 2020. Subsequently oxaliplatin has been on hold given neuropathy.  Interval history-patient currently reports doing well and denies any complaints at this  time.  Neuropathy is mild and overall stable  ECOG PS- 1 Pain scale- 0   Review of systems- Review of Systems  Constitutional: Negative for chills, fever, malaise/fatigue and weight loss.  HENT: Negative for congestion, ear discharge and nosebleeds.   Eyes: Negative for blurred vision.  Respiratory: Negative for cough, hemoptysis, sputum production, shortness of breath and wheezing.   Cardiovascular: Negative for chest pain, palpitations, orthopnea and claudication.  Gastrointestinal: Negative for abdominal pain, blood in stool, constipation, diarrhea, heartburn, melena, nausea and vomiting.  Genitourinary: Negative for dysuria, flank pain, frequency, hematuria and urgency.  Musculoskeletal: Negative for back pain, joint pain and myalgias.  Skin: Negative for rash.  Neurological: Negative for dizziness, tingling, focal weakness, seizures, weakness and headaches.  Endo/Heme/Allergies: Does not bruise/bleed easily.  Psychiatric/Behavioral: Negative for depression and suicidal ideas. The patient does not have insomnia.        No Known Allergies   Past Medical History:  Diagnosis Date  . [place]   . COPD (chronic obstructive pulmonary disease) ([place]   . Hyperlipidemia      Past Surgical History:  Procedure Laterality Date  . COLONOSCOPY WITH PROPOFOL N/A 06/26/2019   Procedure: COLONOSCOPY WITH PROPOFOL;  Surgeon: AJonathon Bellows MD;  [facility];  Service: Gastroenterology;  Laterality: N/A;  . PORTA CATH INSERTION N/A 06/25/2019   Procedure: PORTA CATH INSERTION;  Surgeon: DAlgernon Huxley MD;  [place];  Service: Cardiovascular;  Laterality: N/A;  . PORTA CATH INSERTION Left 08/11/2020   Procedure: PORTA CATH INSERTION;  Surgeon: DAlgernon Huxley MD;  [place] [facility];  Service: Cardiovascular;  Laterality: Left;  . PORTA CATH REMOVAL Right 08/11/2020   Procedure: PORTA CATH REMOVAL;  Surgeon: DAlgernon Huxley MD;  [place] [facility];  Service: Cardiovascular;  Laterality: Right;    Social History   Socioeconomic History  . Marital status: Single    Spouse name: Not on file  . Number of children: Not on file  . Years of education: Not on file  . Highest education level: Not on file  Occupational History  . Not on file  Tobacco Use  . Smoking status: Current Every Day Smoker    Packs/day: 0.50    Years: 40.00    Pack years: 20.00    Types: Cigarettes  . Smokeless tobacco: Never Used  . Tobacco comment: smoking 40 years  Vaping Use  . Vaping Use: Never used  Substance and Sexual Activity  . Alcohol use: No  . Drug use: No  . Sexual activity: Not Currently  Other Topics Concern  . Not on file  Social History Narrative  . Not on file   Social Determinants of Health   Financial Resource Strain:   . Difficulty of Paying Living Expenses: Not on file  Food Insecurity:   . Worried About Charity fundraiser in the Last Year: Not on file  . Ran Out of Food in the Last Year: Not on file  Transportation Needs:   . Lack of Transportation (Medical): Not on file  . Lack of Transportation (Non-Medical): Not on file  Physical Activity:   . Days of Exercise per Week: Not on file  . Minutes of Exercise per Session: Not on file  Stress:   . Feeling of Stress : Not on file  Social Connections:   . Frequency of Communication with Friends and Family: Not on file  . Frequency of Social Gatherings with Friends and Family: Not on file  . Attends Religious Services: Not on file  . Active Member of Clubs or Organizations: Not on file  . Attends Archivist Meetings: Not on file  . Marital Status: Not on file  Intimate Partner Violence:   . Fear of Current or Ex-Partner: Not on file  . Emotionally Abused: Not on file  . Physically Abused: Not on file  . Sexually Abused: Not on file    Family History  Problem Relation Age of Onset  . Brain cancer Father      Current Outpatient  Medications:  .  aspirin EC 81 MG tablet, Take 81 mg by mouth daily., Disp: , Rfl:  .  azelastine (ASTELIN) 0.1 % nasal spray, Place 1 spray into both nostrils 1 day or 1 dose., Disp: , Rfl:  .  butalbital-acetaminophen-caffeine (FIORICET) 50-325-40 MG tablet, Take 1 tablet by mouth every 4 (four) hours as needed., Disp: , Rfl:  .  cephALEXin (KEFLEX) 500 MG capsule, Take 1 capsule (500 mg total) by mouth 4 (four) times daily., Disp: 28 capsule, Rfl: 0 .  cetirizine (ZYRTEC) 10 MG tablet, Take 1 tablet (10 mg total) by mouth daily. (Patient taking differently: Take 10 mg by mouth daily as needed. ), Disp: 30 tablet, Rfl: 0 .  chlorthalidone (HYGROTON) 25 MG tablet, Take 1 tablet by mouth daily., Disp: , Rfl:  .  dexamethasone (DECADRON) 4 MG tablet, Take 2 tablets (8 mg total) by mouth daily. Start the day after chemotherapy for 2 days. Take with food., Disp: 30 tablet, Rfl: 1 .  DEXILANT 60 MG capsule, Take 1 capsule by mouth 1 day or 1 dose., Disp: , Rfl:  .  DULoxetine (CYMBALTA) 30 MG capsule, Take 1 capsule (30 mg total) by mouth 2 (two) times daily., Disp: 60 capsule, Rfl: 1 .  fluticasone (FLONASE) 50 MCG/ACT nasal spray, Place 2 sprays into both nostrils 1 day or 1 dose. , Disp: , Rfl:  .  fluticasone (VERAMYST) 27.5 MCG/SPRAY nasal spray, Place 2 sprays into the nose daily. , Disp: , Rfl:  .  lidocaine-prilocaine (EMLA) cream, Apply to affected area once, Disp: 30 g, Rfl: 3 .  magic mouthwash w/lidocaine SOLN, Take 5-10 mLs by mouth 4 (four) times daily as needed for mouth pain., Disp: 480 mL, Rfl: 3 .  montelukast (SINGULAIR) 10 MG tablet, Take 10 mg by mouth at bedtime. , Disp: , Rfl:  .  nicotine (NICODERM CQ - DOSED IN MG/24 HOURS) 21 mg/24hr patch, Place 1 patch onto the skin 1 day or 1 dose.  (Patient not taking: Reported on 09/08/2020), Disp: , Rfl:  .  ondansetron (ZOFRAN) 8 MG tablet, Take 1 tablet (8 mg total) by mouth 2 (two) times daily as needed for refractory nausea / vomiting.  Start on day 3 after chemotherapy., Disp: 30 tablet, Rfl: 1 .  oxyCODONE (OXY IR/ROXICODONE) 5 MG immediate release tablet, Take 1 tablet (5 mg total) by mouth every 6 (six) hours as needed for severe pain., Disp: 30 tablet, Rfl: 0 .  pantoprazole (PROTONIX) 40 MG tablet, Take 40 mg by mouth daily. , Disp: , Rfl:  .  potassium chloride SA (K-DUR) 20 MEQ tablet, Take 1 tablet (20 mEq total) by mouth daily., Disp: 14 tablet, Rfl: 0 .  pregabalin (LYRICA) 75 MG capsule, Take 1 capsule (75 mg total) by mouth 2 (two) times daily., Disp: 60 capsule, Rfl: 2 .  prochlorperazine (COMPAZINE) 10 MG tablet, Take 1 tablet (10 mg total) by mouth every 6 (six) hours as needed (Nausea or vomiting)., Disp: 30 tablet, Rfl: 1 .  simvastatin (ZOCOR) 20 MG tablet, Take 20 mg by mouth daily. , Disp: , Rfl:  .  sucralfate (CARAFATE) 1 g tablet, Take 1 g by mouth 4 (four) times daily. , Disp: , Rfl:  .  SYMBICORT 80-4.5 MCG/ACT inhaler, Inhale 2 puffs into the lungs 1 day or 1 dose. , Disp: , Rfl:  .  tamsulosin (FLOMAX) 0.4 MG CAPS capsule, Take 0.4 mg by mouth. , Disp: , Rfl:  No current facility-administered medications for this visit.  Facility-Administered Medications Ordered in Other Visits:  .  sodium chloride flush (NS) 0.9 % injection 10 mL, 10 mL, Intracatheter, PRN, Sindy Guadeloupe, MD, 10 mL at 08/15/19 1405 .  sodium chloride flush (NS) 0.9 % injection 10 mL, 10 mL, Intravenous, Once, Sindy Guadeloupe, MD  Physical exam:  Vitals:   09/29/20 0912  BP: 118/75  Pulse: 79  Resp: 16  Temp: 97.8 F (36.6 C)  TempSrc: Oral  Weight: 146 lb 9.6 oz (66.5 kg)  Height: '5\' 7"'  (1.702 m)   Physical Exam HENT:     Head: Normocephalic and atraumatic.  Eyes:     Pupils: Pupils are equal, round, and reactive to light.  Cardiovascular:     Rate and Rhythm: Normal rate and regular rhythm.     Heart sounds: Normal heart sounds.  Pulmonary:     Effort: Pulmonary effort is normal.     Breath sounds: Normal breath  sounds.  Abdominal:     General: Bowel sounds are normal.     Palpations: Abdomen is soft.  Musculoskeletal:     Cervical back: Normal range of motion.  Skin:    General: Skin is warm and dry.  Neurological:     Mental Status:  He is alert and oriented to person, place, and time.      CMP Latest Ref Rng & Units 09/08/2020  Glucose 70 - 99 mg/dL 103(H)  BUN 8 - 23 mg/dL 24(H)  Creatinine 0.61 - 1.24 mg/dL 0.78  Sodium 135 - 145 mmol/L 133(L)  Potassium 3.5 - 5.1 mmol/L 3.8  Chloride 98 - 111 mmol/L 102  CO2 22 - 32 mmol/L 23  Calcium 8.9 - 10.3 mg/dL 8.9  Total Protein 6.5 - 8.1 g/dL 7.5  Total Bilirubin 0.3 - 1.2 mg/dL 1.0  Alkaline Phos 38 - 126 U/L 94  AST 15 - 41 U/L 32  ALT 0 - 44 U/L 25   CBC Latest Ref Rng & Units 09/08/2020  WBC 4.0 - 10.5 K/uL 11.4(H)  Hemoglobin 13.0 - 17.0 g/dL 15.0  Hematocrit 39 - 52 % 43.7  Platelets 150 - 400 K/uL 149(L)    Assessment and plan- Patient is a 69 y.o. male   with metastatic colon cancer TX NX M1 with generalized lymphadenopathy K-ras/beta wild type.He is here for on treatment assessment prior to cycle 25 of 5-FU Mvasi chemotherapy  Counts okay to proceed with cycle 25 of 5-FU Mvasi chemotherapy today.  He will come back on day 3 for pump disconnect and receive Udenyca.  I will see him back in 3 weeks with CBC with differential, CMP for cycle 26.  Overall patient has low-volume disease and tolerating treatment well without any significant side effects.  Recent scans have continued to show good response.  Chemo-induced peripheral neuropathy: Mild grade 1 currently stable.  He is currently on Cymbalta for the same Visit Diagnosis 1. Encounter for antineoplastic chemotherapy   2. 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 ([place])      Dr. Randa Evens, MD, MPH [facility] [phone] 09/29/2020 8:43 AM

## 2020-09-29 NOTE — Progress Notes (Signed)
Pt doing good, no concerns. °

## 2020-10-01 ENCOUNTER — Other Ambulatory Visit: Payer: Self-pay

## 2020-10-01 ENCOUNTER — Inpatient Hospital Stay: Payer: Medicare HMO

## 2020-10-01 VITALS — BP 123/70 | HR 87 | Temp 99.1°F | Resp 16

## 2020-10-01 DIAGNOSIS — Z5112 Encounter for antineoplastic immunotherapy: Secondary | ICD-10-CM | POA: Diagnosis not present

## 2020-10-01 DIAGNOSIS — C772 Secondary and unspecified malignant neoplasm of intra-abdominal lymph nodes: Secondary | ICD-10-CM

## 2020-10-01 DIAGNOSIS — C189 Malignant neoplasm of colon, unspecified: Secondary | ICD-10-CM

## 2020-10-01 MED ORDER — SODIUM CHLORIDE 0.9% FLUSH
10.0000 mL | INTRAVENOUS | Status: DC | PRN
  Administered 2020-10-01: 10 mL
  Filled 2020-10-01: qty 10

## 2020-10-01 MED ORDER — PEGFILGRASTIM-CBQV 6 MG/0.6ML ~~LOC~~ SOSY
6.0000 mg | PREFILLED_SYRINGE | Freq: Once | SUBCUTANEOUS | Status: AC
  Administered 2020-10-01: 6 mg via SUBCUTANEOUS
  Filled 2020-10-01: qty 0.6

## 2020-10-01 MED ORDER — HEPARIN SOD (PORK) LOCK FLUSH 100 UNIT/ML IV SOLN
INTRAVENOUS | Status: AC
  Filled 2020-10-01: qty 5

## 2020-10-01 MED ORDER — HEPARIN SOD (PORK) LOCK FLUSH 100 UNIT/ML IV SOLN
500.0000 [IU] | Freq: Once | INTRAVENOUS | Status: AC | PRN
  Administered 2020-10-01: 500 [IU]
  Filled 2020-10-01: qty 5

## 2020-10-02 ENCOUNTER — Other Ambulatory Visit: Payer: Self-pay | Admitting: *Deleted

## 2020-10-02 DIAGNOSIS — C189 Malignant neoplasm of colon, unspecified: Secondary | ICD-10-CM

## 2020-10-02 DIAGNOSIS — C772 Secondary and unspecified malignant neoplasm of intra-abdominal lymph nodes: Secondary | ICD-10-CM

## 2020-10-02 MED ORDER — LIDOCAINE-PRILOCAINE 2.5-2.5 % EX CREA: TOPICAL_CREAM | CUTANEOUS | 3 refills | Status: DC

## 2020-10-07 ENCOUNTER — Other Ambulatory Visit: Payer: Self-pay

## 2020-10-07 ENCOUNTER — Inpatient Hospital Stay (HOSPITAL_BASED_OUTPATIENT_CLINIC_OR_DEPARTMENT_OTHER): Payer: Medicare HMO | Admitting: Hospice and Palliative Medicine

## 2020-10-07 DIAGNOSIS — C189 Malignant neoplasm of colon, unspecified: Secondary | ICD-10-CM | POA: Diagnosis not present

## 2020-10-07 DIAGNOSIS — Z515 Encounter for palliative care: Secondary | ICD-10-CM

## 2020-10-07 DIAGNOSIS — C772 Secondary and unspecified malignant neoplasm of intra-abdominal lymph nodes: Secondary | ICD-10-CM | POA: Diagnosis not present

## 2020-10-07 NOTE — Progress Notes (Signed)
Virtual Visit via Telephone Note  I connected with Eric Simon on 10/07/20 at 10:30 AM EDT by telephone and verified that I am speaking with the correct person using two identifiers.  Location: Patient: home Provider: clinic   I discussed the limitations, risks, security and privacy concerns of performing an evaluation and management service by telephone and the availability of in person appointments. I also discussed with the patient that there may be a patient responsible charge related to this service. The patient expressed understanding and agreed to proceed.   History of Present Illness: Eric Simon is a 69 y.o. 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 Patient is on 5-FU Mvasi chemotherapy. He was referred to palliative care to discuss goals and manage ongoing symptoms..    Observations/Objective: I called and spoke with patient by phone.  He reports he is doing well.  He denies any significant changes or concerns.  No symptomatic complaints at present.  He reports good oral intake and stable performance status.  No issues with medications nor need for refills.  He has follow-up next week with Dr. [name].  Assessment and Plan: Stage IV colorectal cancer -on systemic treatment and followed by Dr. [name].  Patient appears to be doing reasonably well without severe symptomatic burden at present.  Will follow.  Follow Up Instructions: Follow-up telephone visit about 2 months   I discussed the assessment and treatment plan with the patient. The patient was provided an opportunity to ask questions and all were answered. The patient agreed with the plan and demonstrated an understanding of the instructions.   The patient was advised to call back or seek an in-person evaluation if the symptoms worsen or if the condition fails  to improve as anticipated.  I provided 5 minutes of non-face-to-face time during this encounter.   Irean Hong, NP

## 2020-10-20 ENCOUNTER — Inpatient Hospital Stay: Payer: Medicare HMO | Attending: Oncology

## 2020-10-20 ENCOUNTER — Encounter: Payer: Self-pay | Admitting: Oncology

## 2020-10-20 ENCOUNTER — Inpatient Hospital Stay (HOSPITAL_BASED_OUTPATIENT_CLINIC_OR_DEPARTMENT_OTHER): Payer: Medicare HMO | Admitting: Oncology

## 2020-10-20 ENCOUNTER — Inpatient Hospital Stay: Payer: Medicare HMO

## 2020-10-20 VITALS — BP 113/72 | HR 77 | Temp 98.3°F | Resp 16 | Ht 67.0 in | Wt 148.7 lb

## 2020-10-20 VITALS — BP 150/68 | HR 70 | Resp 16

## 2020-10-20 DIAGNOSIS — C189 Malignant neoplasm of colon, unspecified: Secondary | ICD-10-CM

## 2020-10-20 DIAGNOSIS — Z452 Encounter for adjustment and management of vascular access device: Secondary | ICD-10-CM | POA: Insufficient documentation

## 2020-10-20 DIAGNOSIS — C772 Secondary and unspecified malignant neoplasm of intra-abdominal lymph nodes: Secondary | ICD-10-CM

## 2020-10-20 DIAGNOSIS — Z5189 Encounter for other specified aftercare: Secondary | ICD-10-CM | POA: Insufficient documentation

## 2020-10-20 DIAGNOSIS — T451X5A Adverse effect of antineoplastic and immunosuppressive drugs, initial encounter: Secondary | ICD-10-CM | POA: Diagnosis not present

## 2020-10-20 DIAGNOSIS — C187 Malignant neoplasm of sigmoid colon: Secondary | ICD-10-CM | POA: Diagnosis present

## 2020-10-20 DIAGNOSIS — G620 Drug-induced polyneuropathy: Secondary | ICD-10-CM | POA: Diagnosis not present

## 2020-10-20 DIAGNOSIS — Z5112 Encounter for antineoplastic immunotherapy: Secondary | ICD-10-CM | POA: Diagnosis present

## 2020-10-20 DIAGNOSIS — D696 Thrombocytopenia, unspecified: Secondary | ICD-10-CM | POA: Diagnosis not present

## 2020-10-20 DIAGNOSIS — Z5111 Encounter for antineoplastic chemotherapy: Secondary | ICD-10-CM

## 2020-10-20 DIAGNOSIS — C786 Secondary malignant neoplasm of retroperitoneum and peritoneum: Secondary | ICD-10-CM | POA: Insufficient documentation

## 2020-10-20 LAB — COMPREHENSIVE METABOLIC PANEL
ALT: 25 U/L (ref 0–44)
AST: 34 U/L (ref 15–41)
Albumin: 3.9 g/dL (ref 3.5–5.0)
Alkaline Phosphatase: 106 U/L (ref 38–126)
Anion gap: 8 (ref 5–15)
BUN: 31 mg/dL — ABNORMAL HIGH (ref 8–23)
CO2: 23 mmol/L (ref 22–32)
Calcium: 9.1 mg/dL (ref 8.9–10.3)
Chloride: 104 mmol/L (ref 98–111)
Creatinine, Ser: 0.88 mg/dL (ref 0.61–1.24)
GFR, Estimated: >60 mL/min (ref >60)
Glucose, Bld: 88 mg/dL (ref 70–99)
Potassium: 4 mmol/L (ref 3.5–5.1)
Sodium: 135 mmol/L (ref 135–145)
Total Bilirubin: 0.7 mg/dL (ref 0.3–1.2)
Total Protein: 7.8 g/dL (ref 6.5–8.1)

## 2020-10-20 LAB — CBC WITH DIFFERENTIAL/PLATELET
Abs Immature Granulocytes: 0.06 10*3/uL (ref 0.00–0.07)
Basophils Absolute: 0.1 10*3/uL (ref 0.0–0.1)
Basophils Relative: 1 %
Eosinophils Absolute: 0.2 10*3/uL (ref 0.0–0.5)
Eosinophils Relative: 1 %
HCT: 41.7 % (ref 39.0–52.0)
Hemoglobin: 14.3 g/dL (ref 13.0–17.0)
Immature Granulocytes: 1 %
Lymphocytes Relative: 21 %
Lymphs Abs: 2.5 10*3/uL (ref 0.7–4.0)
MCH: 31.9 pg (ref 26.0–34.0)
MCHC: 34.3 g/dL (ref 30.0–36.0)
MCV: 93.1 fL (ref 80.0–100.0)
Monocytes Absolute: 1 10*3/uL (ref 0.1–1.0)
Monocytes Relative: 8 %
Neutro Abs: 8 10*3/uL — ABNORMAL HIGH (ref 1.7–7.7)
Neutrophils Relative %: 68 %
Platelets: 132 10*3/uL — ABNORMAL LOW (ref 150–400)
RBC: 4.48 MIL/uL (ref 4.22–5.81)
RDW: 15.7 % — ABNORMAL HIGH (ref 11.5–15.5)
WBC: 11.8 10*3/uL — ABNORMAL HIGH (ref 4.0–10.5)
nRBC: 0 % (ref 0.0–0.2)

## 2020-10-20 LAB — PROTEIN, URINE, RANDOM: Total Protein, Urine: 12 mg/dL

## 2020-10-20 MED ORDER — LEUCOVORIN CALCIUM INJECTION 350 MG
700.0000 mg | Freq: Once | INTRAVENOUS | Status: AC
  Administered 2020-10-20: 700 mg via INTRAVENOUS
  Filled 2020-10-20: qty 35

## 2020-10-20 MED ORDER — SODIUM CHLORIDE 0.9 % IV SOLN
2400.0000 mg/m2 | INTRAVENOUS | Status: DC
  Administered 2020-10-20: 4300 mg via INTRAVENOUS
  Filled 2020-10-20: qty 86

## 2020-10-20 MED ORDER — DEXAMETHASONE SODIUM PHOSPHATE 10 MG/ML IJ SOLN
10.0000 mg | Freq: Once | INTRAMUSCULAR | Status: AC
  Administered 2020-10-20: 10 mg via INTRAVENOUS
  Filled 2020-10-20: qty 1

## 2020-10-20 MED ORDER — SODIUM CHLORIDE 0.9 % IV SOLN
7.5000 mg/kg | Freq: Once | INTRAVENOUS | Status: AC
  Administered 2020-10-20: 500 mg via INTRAVENOUS
  Filled 2020-10-20: qty 16

## 2020-10-20 MED ORDER — SODIUM CHLORIDE 0.9 % IV SOLN
Freq: Once | INTRAVENOUS | Status: AC
  Filled 2020-10-20: qty 250

## 2020-10-20 MED ORDER — SODIUM CHLORIDE 0.9% FLUSH
10.0000 mL | Freq: Once | INTRAVENOUS | Status: AC
  Administered 2020-10-20: 10 mL via INTRAVENOUS
  Filled 2020-10-20: qty 10

## 2020-10-20 MED ORDER — FLUOROURACIL CHEMO INJECTION 2.5 GM/50ML
400.0000 mg/m2 | Freq: Once | INTRAVENOUS | Status: AC
  Administered 2020-10-20: 700 mg via INTRAVENOUS
  Filled 2020-10-20: qty 14

## 2020-10-20 NOTE — Progress Notes (Signed)
Pt doing fine, no complaints of any kind.

## 2020-10-20 NOTE — Progress Notes (Signed)
Hematology/Oncology Consult note [facility]  Telephone:([phone] Fax:([phone]  Patient Care Team: Jodi Marble, MD as PCP - General (Internal Medicine) Clent Jacks, RN as Oncology Nurse Navigator   Name of the patient: Eric Simon  [id number]  06/13/1951   Date of visit: 10/20/20  Diagnosis- metastatic colon cancer with lymph node metastases K-ras/BRAF wild-type  Chief complaint/ Reason for visit-on treatment assessment prior to cycle 26 of 5-FU Mvasi chemotherapy  Heme/Onc history: Patient is a69yrold male with >40 pack year history of smoking. He currently smokes 0.5ppd. he presented to the ER with symptoms of left sided chest pain and left arm pain. Troponin was negative ekg was unremarkable. Ct chest showed no PE. He was incidentally noted to have mediastinal and retrocrural adenopathy and a 2.1X2.3X4.4 cm retroaortic soft tissue lesion in the posterior left chest. 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 Low-grade activity in the left lateral fifth and sixth ribs associated with nondisplaced fractures likely reflecting healing response problem malignancy.  Patient underwent CT-guided biopsy of the retroperitoneal lymph node pathology showed metastatic adenocarcinoma compatible with colorectal origin. CK7 negative. CK20 positive. CDX 2+. TTF-1 negative. PSA negative. This pattern of immunoreactivity supports the above diagnosis. Patient underwent colonoscopy which showed sigmoid mass that was consistent with adenocarcinoma. RASpanel testing showed that he was wild-type for both K-ras and BRAF  FOLFOX and bevacizumab chemotherapy started in July 2020. Subsequently oxaliplatin has been on hold given neuropathy.   Interval history-patient reports doing well and denies any complaints at this time  other than his peripheral neuropathy which is essentially stable at this time with Cymbalta.  Appetite and weight has remained stable.  He denies any pain  ECOG PS- 1 Pain scale- 0 Opioid associated constipation- no  Review of systems- Review of Systems  Constitutional: Negative for chills, fever, malaise/fatigue and weight loss.  HENT: Negative for congestion, ear discharge and nosebleeds.   Eyes: Negative for blurred vision.  Respiratory: Negative for cough, hemoptysis, sputum production, shortness of breath and wheezing.   Cardiovascular: Negative for chest pain, palpitations, orthopnea and claudication.  Gastrointestinal: Negative for abdominal pain, blood in stool, constipation, diarrhea, heartburn, melena, nausea and vomiting.  Genitourinary: Negative for dysuria, flank pain, frequency, hematuria and urgency.  Musculoskeletal: Negative for back pain, joint pain and myalgias.  Skin: Negative for rash.  Neurological: Positive for sensory change (Peripheral neuropathy). Negative for dizziness, tingling, focal weakness, seizures, weakness and headaches.  Endo/Heme/Allergies: Does not bruise/bleed easily.  Psychiatric/Behavioral: Negative for depression and suicidal ideas. The patient does not have insomnia.       No Known Allergies   Past Medical History:  Diagnosis Date  . Colon cancer ([facility]   . COPD (chronic obstructive pulmonary disease) ([facility]   . Hyperlipidemia      Past Surgical History:  Procedure Laterality Date  . COLONOSCOPY WITH PROPOFOL N/A 06/26/2019   Procedure: COLONOSCOPY WITH PROPOFOL;  Surgeon: AJonathon Bellows MD;  [facility];  Service: Gastroenterology;  Laterality: N/A;  . PORTA CATH INSERTION N/A 06/25/2019   Procedure: PORTA CATH INSERTION;  Surgeon: DAlgernon Huxley MD;  [place];  Service: Cardiovascular;  Laterality: N/A;  . PORTA CATH INSERTION Left 08/11/2020   Procedure: PORTA CATH INSERTION;  Surgeon: DAlgernon Huxley MD;   [place];  Service: Cardiovascular;  Laterality: Left;  .  PORTA CATH REMOVAL Right 08/11/2020   Procedure: PORTA CATH REMOVAL;  Surgeon: Algernon Huxley, MD;  [place] [facility];  Service: Cardiovascular;  Laterality: Right;    Social History   Socioeconomic History  . Marital status: Single    Spouse name: Not on file  . Number of children: Not on file  . Years of education: Not on file  . Highest education level: Not on file  Occupational History  . Not on file  Tobacco Use  . Smoking status: Current Every Day Smoker    Packs/day: 0.50    Years: 40.00    Pack years: 20.00    Types: Cigarettes  . Smokeless tobacco: Never Used  . Tobacco comment: smoking 40 years  Vaping Use  . Vaping Use: Never used  Substance and Sexual Activity  . Alcohol use: No  . Drug use: No  . Sexual activity: Not Currently  Other Topics Concern  . Not on file  Social History Narrative  . Not on file   Social Determinants of Health   Financial Resource Strain:   . Difficulty of Paying Living Expenses: Not on file  Food Insecurity:   . Worried About Charity fundraiser in the Last Year: Not on file  . Ran Out of Food in the Last Year: Not on file  Transportation Needs:   . Lack of Transportation (Medical): Not on file  . Lack of Transportation (Non-Medical): Not on file  Physical Activity:   . Days of Exercise per Week: Not on file  . Minutes of Exercise per Session: Not on file  Stress:   . Feeling of Stress : Not on file  Social Connections:   . Frequency of Communication with Friends and Family: Not on file  . Frequency of Social Gatherings with Friends and Family: Not on file  . Attends Religious Services: Not on file  . Active Member of Clubs or Organizations: Not on file  . Attends Archivist Meetings: Not on file  . Marital Status: Not on file  Intimate Partner Violence:   . Fear of Current or Ex-Partner: Not on file  . Emotionally Abused:  Not on file  . Physically Abused: Not on file  . Sexually Abused: Not on file    Family History  Problem Relation Age of Onset  . Brain cancer Father      Current Outpatient Medications:  .  aspirin EC 81 MG tablet, Take 81 mg by mouth daily., Disp: , Rfl:  .  azelastine (ASTELIN) 0.1 % nasal spray, Place 1 spray into both nostrils 1 day or 1 dose., Disp: , Rfl:  .  butalbital-acetaminophen-caffeine (FIORICET) 50-325-40 MG tablet, Take 1 tablet by mouth every 4 (four) hours as needed., Disp: , Rfl:  .  cetirizine (ZYRTEC) 10 MG tablet, Take 1 tablet (10 mg total) by mouth daily. (Patient taking differently: Take 10 mg by mouth daily as needed. ), Disp: 30 tablet, Rfl: 0 .  chlorthalidone (HYGROTON) 25 MG tablet, Take 1 tablet by mouth daily., Disp: , Rfl:  .  DEXILANT 60 MG capsule, Take 1 capsule by mouth 1 day or 1 dose., Disp: , Rfl:  .  DULoxetine (CYMBALTA) 30 MG capsule, Take 1 capsule (30 mg total) by mouth 2 (two) times daily., Disp: 60 capsule, Rfl: 1 .  fluticasone (FLONASE) 50 MCG/ACT nasal spray, Place 2 sprays into both nostrils 1 day or 1 dose. , Disp: , Rfl:  .  fluticasone (VERAMYST) 27.5  MCG/SPRAY nasal spray, Place 2 sprays into the nose daily. , Disp: , Rfl:  .  lidocaine-prilocaine (EMLA) cream, Apply to affected area once, Disp: 30 g, Rfl: 3 .  montelukast (SINGULAIR) 10 MG tablet, Take 10 mg by mouth at bedtime. , Disp: , Rfl:  .  pantoprazole (PROTONIX) 40 MG tablet, Take 40 mg by mouth daily. , Disp: , Rfl:  .  pregabalin (LYRICA) 75 MG capsule, Take 1 capsule (75 mg total) by mouth 2 (two) times daily., Disp: 60 capsule, Rfl: 2 .  simvastatin (ZOCOR) 20 MG tablet, Take 20 mg by mouth daily. , Disp: , Rfl:  .  SYMBICORT 80-4.5 MCG/ACT inhaler, Inhale 2 puffs into the lungs 1 day or 1 dose. , Disp: , Rfl:  .  tamsulosin (FLOMAX) 0.4 MG CAPS capsule, Take 0.4 mg by mouth. , Disp: , Rfl:  .  dexamethasone (DECADRON) 4 MG tablet, Take 2 tablets (8 mg total) by mouth  daily. Start the day after chemotherapy for 2 days. Take with food. (Patient not taking: Reported on 10/20/2020), Disp: 30 tablet, Rfl: 1 .  magic mouthwash w/lidocaine SOLN, Take 5-10 mLs by mouth 4 (four) times daily as needed for mouth pain. (Patient not taking: Reported on 10/20/2020), Disp: 480 mL, Rfl: 3 .  nicotine (NICODERM CQ - DOSED IN MG/24 HOURS) 21 mg/24hr patch, Place 1 patch onto the skin 1 day or 1 dose.  (Patient not taking: Reported on 09/29/2020), Disp: , Rfl:  .  ondansetron (ZOFRAN) 8 MG tablet, Take 1 tablet (8 mg total) by mouth 2 (two) times daily as needed for refractory nausea / vomiting. Start on day 3 after chemotherapy. (Patient not taking: Reported on 09/29/2020), Disp: 30 tablet, Rfl: 1 .  oxyCODONE (OXY IR/ROXICODONE) 5 MG immediate release tablet, Take 1 tablet (5 mg total) by mouth every 6 (six) hours as needed for severe pain. (Patient not taking: Reported on 09/29/2020), Disp: 30 tablet, Rfl: 0 .  potassium chloride SA (K-DUR) 20 MEQ tablet, Take 1 tablet (20 mEq total) by mouth daily. (Patient not taking: Reported on 10/20/2020), Disp: 14 tablet, Rfl: 0 .  prochlorperazine (COMPAZINE) 10 MG tablet, Take 1 tablet (10 mg total) by mouth every 6 (six) hours as needed (Nausea or vomiting). (Patient not taking: Reported on 09/29/2020), Disp: 30 tablet, Rfl: 1 No current facility-administered medications for this visit.  Facility-Administered Medications Ordered in Other Visits:  .  fluorouracil (ADRUCIL) 4,300 mg in sodium chloride 0.9 % 64 mL chemo infusion, 2,400 mg/m2 (Treatment Plan Recorded), Intravenous, 1 day or 1 dose, Sindy Guadeloupe, MD, 4,300 mg at 10/20/20 1246 .  sodium chloride flush (NS) 0.9 % injection 10 mL, 10 mL, Intracatheter, PRN, Sindy Guadeloupe, MD, 10 mL at 08/15/19 1405  Physical exam:  Vitals:   10/20/20 0936  BP: 113/72  Pulse: 77  Resp: 16  Temp: 98.3 F (36.8 C)  TempSrc: Oral  Weight: 148 lb 11.2 oz (67.4 kg)  Height: '5\' 7"'  (1.702 m)    Physical Exam Cardiovascular:     Rate and Rhythm: Normal rate and regular rhythm.     Heart sounds: Normal heart sounds.  Pulmonary:     Effort: Pulmonary effort is normal.     Breath sounds: Normal breath sounds.  Abdominal:     General: Bowel sounds are normal.     Palpations: Abdomen is soft.  Skin:    General: Skin is warm and dry.  Neurological:     Mental Status: He  is alert and oriented to person, place, and time.      CMP Latest Ref Rng & Units 10/20/2020  Glucose 70 - 99 mg/dL 88  BUN 8 - 23 mg/dL 31(H)  Creatinine 0.61 - 1.24 mg/dL 0.88  Sodium 135 - 145 mmol/L 135  Potassium 3.5 - 5.1 mmol/L 4.0  Chloride 98 - 111 mmol/L 104  CO2 22 - 32 mmol/L 23  Calcium 8.9 - 10.3 mg/dL 9.1  Total Protein 6.5 - 8.1 g/dL 7.8  Total Bilirubin 0.3 - 1.2 mg/dL 0.7  Alkaline Phos 38 - 126 U/L 106  AST 15 - 41 U/L 34  ALT 0 - 44 U/L 25   CBC Latest Ref Rng & Units 10/20/2020  WBC 4.0 - 10.5 K/uL 11.8(H)  Hemoglobin 13.0 - 17.0 g/dL 14.3  Hematocrit 39 - 52 % 41.7  Platelets 150 - 400 K/uL 132(L)      Assessment and plan- Patient is a 69 y.o. male with metastatic colon cancer TX NX M1 with generalized lymphadenopathy K-ras/beta wild type. He is here for on treatment assessment prior to cycle 26 of 5-FU Mvasi chemotherapy  Counts okay to proceed with cycle 26 of 5-FU Mvasi chemotherapy today.  Overall tolerating chemotherapy well without any significant side effects.  He will come on day 3 for pump disconnect and receive urine implant that day.  Mild thrombocytopenia: Possibly secondary to chemotherapy.  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  Patient willing to consider acupuncture probably after Thanksgiving.  I will see him back in 3 weeks with port labs for cycle 27   Visit Diagnosis 1. 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 ([place])   2. Encounter for antineoplastic chemotherapy   3. Encounter for monoclonal  antibody treatment for malignancy   4. Chemotherapy-induced peripheral neuropathy ([place])      Dr. Randa Evens, MD, MPH [facility] [phone] 10/20/2020 3:15 PM

## 2020-10-22 ENCOUNTER — Inpatient Hospital Stay: Payer: Medicare HMO

## 2020-10-22 VITALS — BP 107/68 | HR 81 | Temp 98.7°F | Resp 18

## 2020-10-22 DIAGNOSIS — C772 Secondary and unspecified malignant neoplasm of intra-abdominal lymph nodes: Secondary | ICD-10-CM

## 2020-10-22 DIAGNOSIS — C189 Malignant neoplasm of colon, unspecified: Secondary | ICD-10-CM

## 2020-10-22 DIAGNOSIS — Z5112 Encounter for antineoplastic immunotherapy: Secondary | ICD-10-CM | POA: Diagnosis not present

## 2020-10-22 MED ORDER — PEGFILGRASTIM-CBQV 6 MG/0.6ML ~~LOC~~ SOSY
6.0000 mg | PREFILLED_SYRINGE | Freq: Once | SUBCUTANEOUS | Status: AC
  Administered 2020-10-22: 6 mg via SUBCUTANEOUS
  Filled 2020-10-22: qty 0.6

## 2020-10-22 MED ORDER — HEPARIN SOD (PORK) LOCK FLUSH 100 UNIT/ML IV SOLN
INTRAVENOUS | Status: AC
  Filled 2020-10-22: qty 5

## 2020-10-22 MED ORDER — SODIUM CHLORIDE 0.9% FLUSH
10.0000 mL | INTRAVENOUS | Status: DC | PRN
  Administered 2020-10-22: 10 mL
  Filled 2020-10-22: qty 10

## 2020-10-22 MED ORDER — HEPARIN SOD (PORK) LOCK FLUSH 100 UNIT/ML IV SOLN
500.0000 [IU] | Freq: Once | INTRAVENOUS | Status: AC | PRN
  Administered 2020-10-22: 500 [IU]
  Filled 2020-10-22: qty 5

## 2020-10-22 NOTE — Progress Notes (Signed)
Pt arrived to clinic for pump d/c and udenyca injection. Given as ordered. VSS at discharge.

## 2020-11-09 ENCOUNTER — Other Ambulatory Visit: Payer: Self-pay | Admitting: *Deleted

## 2020-11-09 DIAGNOSIS — C189 Malignant neoplasm of colon, unspecified: Secondary | ICD-10-CM

## 2020-11-10 ENCOUNTER — Ambulatory Visit: Payer: Medicare HMO

## 2020-11-10 ENCOUNTER — Other Ambulatory Visit: Payer: Self-pay

## 2020-11-10 ENCOUNTER — Inpatient Hospital Stay: Payer: Medicare HMO

## 2020-11-10 ENCOUNTER — Encounter: Payer: Self-pay | Admitting: Oncology

## 2020-11-10 ENCOUNTER — Inpatient Hospital Stay (HOSPITAL_BASED_OUTPATIENT_CLINIC_OR_DEPARTMENT_OTHER): Payer: Medicare HMO | Admitting: Oncology

## 2020-11-10 VITALS — BP 117/78 | HR 73 | Temp 97.2°F | Resp 16 | Ht 67.0 in | Wt 147.5 lb

## 2020-11-10 DIAGNOSIS — G620 Drug-induced polyneuropathy: Secondary | ICD-10-CM

## 2020-11-10 DIAGNOSIS — Z5111 Encounter for antineoplastic chemotherapy: Secondary | ICD-10-CM | POA: Diagnosis not present

## 2020-11-10 DIAGNOSIS — C189 Malignant neoplasm of colon, unspecified: Secondary | ICD-10-CM

## 2020-11-10 DIAGNOSIS — Z5112 Encounter for antineoplastic immunotherapy: Secondary | ICD-10-CM

## 2020-11-10 DIAGNOSIS — T451X5A Adverse effect of antineoplastic and immunosuppressive drugs, initial encounter: Secondary | ICD-10-CM

## 2020-11-10 DIAGNOSIS — C772 Secondary and unspecified malignant neoplasm of intra-abdominal lymph nodes: Secondary | ICD-10-CM

## 2020-11-10 LAB — COMPREHENSIVE METABOLIC PANEL
ALT: 36 U/L (ref 0–44)
AST: 51 U/L — ABNORMAL HIGH (ref 15–41)
Albumin: 3.9 g/dL (ref 3.5–5.0)
Alkaline Phosphatase: 103 U/L (ref 38–126)
Anion gap: 9 (ref 5–15)
BUN: 25 mg/dL — ABNORMAL HIGH (ref 8–23)
CO2: 25 mmol/L (ref 22–32)
Calcium: 9.3 mg/dL (ref 8.9–10.3)
Chloride: 101 mmol/L (ref 98–111)
Creatinine, Ser: 0.71 mg/dL (ref 0.61–1.24)
GFR, Estimated: >60 mL/min (ref >60)
Glucose, Bld: 96 mg/dL (ref 70–99)
Potassium: 4 mmol/L (ref 3.5–5.1)
Sodium: 135 mmol/L (ref 135–145)
Total Bilirubin: 1 mg/dL (ref 0.3–1.2)
Total Protein: 8 g/dL (ref 6.5–8.1)

## 2020-11-10 LAB — CBC WITH DIFFERENTIAL/PLATELET
Abs Immature Granulocytes: 0.07 10*3/uL (ref 0.00–0.07)
Basophils Absolute: 0.1 10*3/uL (ref 0.0–0.1)
Basophils Relative: 1 %
Eosinophils Absolute: 0.2 10*3/uL (ref 0.0–0.5)
Eosinophils Relative: 2 %
HCT: 42.7 % (ref 39.0–52.0)
Hemoglobin: 14.3 g/dL (ref 13.0–17.0)
Immature Granulocytes: 1 %
Lymphocytes Relative: 23 %
Lymphs Abs: 2.3 10*3/uL (ref 0.7–4.0)
MCH: 31.4 pg (ref 26.0–34.0)
MCHC: 33.5 g/dL (ref 30.0–36.0)
MCV: 93.6 fL (ref 80.0–100.0)
Monocytes Absolute: 0.8 10*3/uL (ref 0.1–1.0)
Monocytes Relative: 8 %
Neutro Abs: 6.5 10*3/uL (ref 1.7–7.7)
Neutrophils Relative %: 65 %
Platelets: 128 10*3/uL — ABNORMAL LOW (ref 150–400)
RBC: 4.56 MIL/uL (ref 4.22–5.81)
RDW: 15.9 % — ABNORMAL HIGH (ref 11.5–15.5)
WBC: 10 10*3/uL (ref 4.0–10.5)
nRBC: 0 % (ref 0.0–0.2)

## 2020-11-10 LAB — PROTEIN, URINE, RANDOM: Total Protein, Urine: 17 mg/dL

## 2020-11-10 MED ORDER — DEXAMETHASONE SODIUM PHOSPHATE 10 MG/ML IJ SOLN
10.0000 mg | Freq: Once | INTRAMUSCULAR | Status: AC
  Administered 2020-11-10: 10 mg via INTRAVENOUS
  Filled 2020-11-10: qty 1

## 2020-11-10 MED ORDER — LEUCOVORIN CALCIUM INJECTION 100 MG
20.0000 mg/m2 | Freq: Once | INTRAMUSCULAR | Status: AC
  Administered 2020-11-10: 36 mg via INTRAVENOUS
  Filled 2020-11-10: qty 1.8

## 2020-11-10 MED ORDER — LEUCOVORIN CALCIUM INJECTION 350 MG: 700.0000 mg | Freq: Once | INTRAVENOUS | Status: DC

## 2020-11-10 MED ORDER — SODIUM CHLORIDE 0.9 % IV SOLN
Freq: Once | INTRAVENOUS | Status: AC
  Filled 2020-11-10: qty 250

## 2020-11-10 MED ORDER — SODIUM CHLORIDE 0.9 % IV SOLN
2400.0000 mg/m2 | INTRAVENOUS | Status: DC
  Administered 2020-11-10: 4300 mg via INTRAVENOUS
  Filled 2020-11-10: qty 86

## 2020-11-10 MED ORDER — FLUOROURACIL CHEMO INJECTION 2.5 GM/50ML
400.0000 mg/m2 | Freq: Once | INTRAVENOUS | Status: AC
  Administered 2020-11-10: 700 mg via INTRAVENOUS
  Filled 2020-11-10: qty 14

## 2020-11-10 MED ORDER — SODIUM CHLORIDE 0.9 % IV SOLN
7.5000 mg/kg | Freq: Once | INTRAVENOUS | Status: AC
  Administered 2020-11-10: 500 mg via INTRAVENOUS
  Filled 2020-11-10: qty 16

## 2020-11-10 NOTE — Progress Notes (Signed)
Hematology/Oncology Consult note [facility]  Telephone:([phone] Fax:([phone]  Patient Care Team: Jodi Marble, MD as PCP - General (Internal Medicine) Clent Jacks, RN as Oncology Nurse Navigator Sindy Guadeloupe, MD as Consulting Physician (Hematology and Oncology)   Name of the patient: Eric Simon  [id number]  1951-09-30   Date of visit: 11/10/20  Diagnosis- metastatic colon cancer with lymph node metastases K-ras/BRAF wild-type  Chief complaint/ Reason for visit-on treatment assessment prior to cycle 27 of 5-FU Mvasi chemotherapy  Heme/Onc history: Patient is a69yrold male with >40 pack year history of smoking. He currently smokes 0.5ppd. he presented to the ER with symptoms of left sided chest pain and left arm pain. Troponin was negative ekg was unremarkable. Ct chest showed no PE. He was incidentally noted to have mediastinal and retrocrural adenopathy and a 2.1X2.3X4.4 cm retroaortic soft tissue lesion in the posterior left chest. 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 Low-grade activity in the left lateral fifth and sixth ribs associated with nondisplaced fractures likely reflecting healing response problem malignancy.  Patient underwent CT-guided biopsy of the retroperitoneal lymph node pathology showed metastatic adenocarcinoma compatible with colorectal origin. CK7 negative. CK20 positive. CDX 2+. TTF-1 negative. PSA negative. This pattern of immunoreactivity supports the above diagnosis. Patient underwent colonoscopy which showed sigmoid mass that was consistent with adenocarcinoma. RASpanel testing showed that he was wild-type for both K-ras and BRAF  FOLFOX and bevacizumab chemotherapy started in July 2020. Subsequently oxaliplatin has been on hold given neuropathy.    Interval  history-patient reports doing well and denies any complaints at this time.  Peripheral neuropathy is essentially stable  ECOG PS- 1 Pain scale- 0   Review of systems- Review of Systems  Constitutional: Negative for chills, fever, malaise/fatigue and weight loss.  HENT: Negative for congestion, ear discharge and nosebleeds.   Eyes: Negative for blurred vision.  Respiratory: Negative for cough, hemoptysis, sputum production, shortness of breath and wheezing.   Cardiovascular: Negative for chest pain, palpitations, orthopnea and claudication.  Gastrointestinal: Negative for abdominal pain, blood in stool, constipation, diarrhea, heartburn, melena, nausea and vomiting.  Genitourinary: Negative for dysuria, flank pain, frequency, hematuria and urgency.  Musculoskeletal: Negative for back pain, joint pain and myalgias.  Skin: Negative for rash.  Neurological: Negative for dizziness, tingling, focal weakness, seizures, weakness and headaches.  Endo/Heme/Allergies: Does not bruise/bleed easily.  Psychiatric/Behavioral: Negative for depression and suicidal ideas. The patient does not have insomnia.       No Known Allergies   Past Medical History:  Diagnosis Date  . Colon cancer ([place]   . COPD (chronic obstructive pulmonary disease) ([place]   . Hyperlipidemia      Past Surgical History:  Procedure Laterality Date  . COLONOSCOPY WITH PROPOFOL N/A 06/26/2019   Procedure: COLONOSCOPY WITH PROPOFOL;  Surgeon: AJonathon Bellows MD;  [facility];  Service: Gastroenterology;  Laterality: N/A;  . PORTA CATH INSERTION N/A 06/25/2019   Procedure: PORTA CATH INSERTION;  Surgeon: DAlgernon Huxley MD;  [place] [facility];  Service: Cardiovascular;  Laterality: N/A;  . PORTA CATH INSERTION Left 08/11/2020   Procedure: PORTA CATH INSERTION;  Surgeon: DAlgernon Huxley MD;  [facility];  Service: Cardiovascular;  Laterality: Left;  . PORTA CATH REMOVAL Right 08/11/2020    Procedure: PORTA CATH REMOVAL;  Surgeon: DAlgernon Huxley MD;  Location: [place] [facility] LAB;  Service: Cardiovascular;  Laterality: Right;    Social History   Socioeconomic History  . Marital status: Single    Spouse name: Not on file  . Number of children: Not on file  . Years of education: Not on file  . Highest education level: Not on file  Occupational History  . Not on file  Tobacco Use  . Smoking status: Current Every Day Smoker    Packs/day: 0.50    Years: 40.00    Pack years: 20.00    Types: Cigarettes  . Smokeless tobacco: Never Used  . Tobacco comment: smoking 40 years  Vaping Use  . Vaping Use: Never used  Substance and Sexual Activity  . Alcohol use: No  . Drug use: No  . Sexual activity: Not Currently  Other Topics Concern  . Not on file  Social History Narrative  . Not on file   Social Determinants of Health   Financial Resource Strain:   . Difficulty of Paying Living Expenses: Not on file  Food Insecurity:   . Worried About Charity fundraiser in the Last Year: Not on file  . Ran Out of Food in the Last Year: Not on file  Transportation Needs:   . Lack of Transportation (Medical): Not on file  . Lack of Transportation (Non-Medical): Not on file  Physical Activity:   . Days of Exercise per Week: Not on file  . Minutes of Exercise per Session: Not on file  Stress:   . Feeling of Stress : Not on file  Social Connections:   . Frequency of Communication with Friends and Family: Not on file  . Frequency of Social Gatherings with Friends and Family: Not on file  . Attends Religious Services: Not on file  . Active Member of Clubs or Organizations: Not on file  . Attends Archivist Meetings: Not on file  . Marital Status: Not on file  Intimate Partner Violence:   . Fear of Current or Ex-Partner: Not on file  . Emotionally Abused: Not on file  . Physically Abused: Not on file  . Sexually Abused: Not on file    Family History  Problem  Relation Age of Onset  . Brain cancer Father      Current Outpatient Medications:  .  aspirin EC 81 MG tablet, Take 81 mg by mouth daily., Disp: , Rfl:  .  azelastine (ASTELIN) 0.1 % nasal spray, Place 1 spray into both nostrils 1 day or 1 dose., Disp: , Rfl:  .  butalbital-acetaminophen-caffeine (FIORICET) 50-325-40 MG tablet, Take 1 tablet by mouth every 4 (four) hours as needed., Disp: , Rfl:  .  cetirizine (ZYRTEC) 10 MG tablet, Take 1 tablet (10 mg total) by mouth daily. (Patient taking differently: Take 10 mg by mouth daily as needed. ), Disp: 30 tablet, Rfl: 0 .  chlorthalidone (HYGROTON) 25 MG tablet, Take 1 tablet by mouth daily., Disp: , Rfl:  .  DULoxetine (CYMBALTA) 30 MG capsule, Take 1 capsule (30 mg total) by mouth 2 (two) times daily., Disp: 60 capsule, Rfl: 1 .  fluticasone (FLONASE) 50 MCG/ACT nasal spray, Place 2 sprays into both nostrils 1 day or 1 dose. , Disp: , Rfl:  .  fluticasone (VERAMYST) 27.5 MCG/SPRAY nasal spray, Place 2 sprays into the nose daily. , Disp: , Rfl:  .  lidocaine-prilocaine (EMLA) cream, Apply to affected area once, Disp: 30 g, Rfl: 3 .  montelukast (SINGULAIR) 10 MG tablet, Take  10 mg by mouth at bedtime. , Disp: , Rfl:  .  pantoprazole (PROTONIX) 40 MG tablet, Take 40 mg by mouth daily. , Disp: , Rfl:  .  pregabalin (LYRICA) 75 MG capsule, Take 1 capsule (75 mg total) by mouth 2 (two) times daily., Disp: 60 capsule, Rfl: 2 .  simvastatin (ZOCOR) 20 MG tablet, Take 20 mg by mouth daily. , Disp: , Rfl:  .  SYMBICORT 80-4.5 MCG/ACT inhaler, Inhale 2 puffs into the lungs 1 day or 1 dose. , Disp: , Rfl:  .  tamsulosin (FLOMAX) 0.4 MG CAPS capsule, Take 0.4 mg by mouth. , Disp: , Rfl:  .  dexamethasone (DECADRON) 4 MG tablet, Take 2 tablets (8 mg total) by mouth daily. Start the day after chemotherapy for 2 days. Take with food. (Patient not taking: Reported on 10/20/2020), Disp: 30 tablet, Rfl: 1 .  DEXILANT 60 MG capsule, Take 1 capsule by mouth 1 day or  1 dose., Disp: , Rfl:  .  magic mouthwash w/lidocaine SOLN, Take 5-10 mLs by mouth 4 (four) times daily as needed for mouth pain. (Patient not taking: Reported on 10/20/2020), Disp: 480 mL, Rfl: 3 .  nicotine (NICODERM CQ - DOSED IN MG/24 HOURS) 21 mg/24hr patch, Place 1 patch onto the skin 1 day or 1 dose.  (Patient not taking: Reported on 11/10/2020), Disp: , Rfl:  .  ondansetron (ZOFRAN) 8 MG tablet, Take 1 tablet (8 mg total) by mouth 2 (two) times daily as needed for refractory nausea / vomiting. Start on day 3 after chemotherapy. (Patient not taking: Reported on 09/29/2020), Disp: 30 tablet, Rfl: 1 .  oxyCODONE (OXY IR/ROXICODONE) 5 MG immediate release tablet, Take 1 tablet (5 mg total) by mouth every 6 (six) hours as needed for severe pain. (Patient not taking: Reported on 09/29/2020), Disp: 30 tablet, Rfl: 0 .  potassium chloride SA (K-DUR) 20 MEQ tablet, Take 1 tablet (20 mEq total) by mouth daily. (Patient not taking: Reported on 10/20/2020), Disp: 14 tablet, Rfl: 0 .  prochlorperazine (COMPAZINE) 10 MG tablet, Take 1 tablet (10 mg total) by mouth every 6 (six) hours as needed (Nausea or vomiting). (Patient not taking: Reported on 09/29/2020), Disp: 30 tablet, Rfl: 1 No current facility-administered medications for this visit.  Facility-Administered Medications Ordered in Other Visits:  .  fluorouracil (ADRUCIL) 4,300 mg in sodium chloride 0.9 % 64 mL chemo infusion, 2,400 mg/m2 (Treatment Plan Recorded), Intravenous, 1 day or 1 dose, Sindy Guadeloupe, MD, 4,300 mg at 11/10/20 1135 .  sodium chloride flush (NS) 0.9 % injection 10 mL, 10 mL, Intracatheter, PRN, Sindy Guadeloupe, MD, 10 mL at 08/15/19 1405  Physical exam:  Vitals:   11/10/20 0901  BP: 117/78  Pulse: 73  Resp: 16  Temp: (!) 97.2 F (36.2 C)  TempSrc: Oral  Weight: 147 lb 8 oz (66.9 kg)  Height: $Remove'5\' 7"'[id number]$  (1.702 m)   Physical Exam Constitutional:      General: He is not in acute distress. Cardiovascular:     Rate and  Rhythm: Normal rate and regular rhythm.     Heart sounds: Normal heart sounds.  Pulmonary:     Effort: Pulmonary effort is normal.     Breath sounds: Normal breath sounds.  Abdominal:     General: Bowel sounds are normal.     Palpations: Abdomen is soft.  Skin:    General: Skin is warm and dry.  Neurological:     Mental Status: He is alert and oriented  to person, place, and time.      CMP Latest Ref Rng & Units 11/10/2020  Glucose 70 - 99 mg/dL 96  BUN 8 - 23 mg/dL 25(H)  Creatinine 0.61 - 1.24 mg/dL 0.71  Sodium 135 - 145 mmol/L 135  Potassium 3.5 - 5.1 mmol/L 4.0  Chloride 98 - 111 mmol/L 101  CO2 22 - 32 mmol/L 25  Calcium 8.9 - 10.3 mg/dL 9.3  Total Protein 6.5 - 8.1 g/dL 8.0  Total Bilirubin 0.3 - 1.2 mg/dL 1.0  Alkaline Phos 38 - 126 U/L 103  AST 15 - 41 U/L 51(H)  ALT 0 - 44 U/L 36   CBC Latest Ref Rng & Units 11/10/2020  WBC 4.0 - 10.5 K/uL 10.0  Hemoglobin 13.0 - 17.0 g/dL 14.3  Hematocrit 39 - 52 % 42.7  Platelets 150 - 400 K/uL 128(L)      Assessment and plan- Patient is a 69 y.o. male with metastatic colon cancer TX NX M1 with generalized lymphadenopathy K-ras/beta wild type.  He is here for on treatment assessment prior to cycle 27 of 5-FU Mvasi chemotherapy  Counseling to proceed with cycle 27 of 5-FU Mvasi chemotherapy today.  He will come on day 3 for pump disconnect and receives [place].  I will see him back in 3 weeks for cycle 28.  Patient is tolerating treatment well without any significant side effects and overall he has low-volume disease which continue to show response.  Thrombocytopenia: Likely secondary to chemotherapy.  Continue to monitor  Chemo-induced peripheral neuropathy: Continue Cymbalta   Visit Diagnosis 1. Encounter for monoclonal antibody treatment for malignancy   2. Encounter for antineoplastic chemotherapy   3. 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 ([place])   4. Chemotherapy-induced peripheral neuropathy ([place])       Dr. Randa Evens, MD, MPH [facility] [phone] 11/10/2020 2:25 PM

## 2020-11-10 NOTE — Progress Notes (Signed)
0949- Today's protein, urine, random result is still pending. 10/20/2020 protein, urine, random result was 12. MD, Dr. [name], notified and aware. Per MD order: reference 10/20/2020 protein, urine, random result and proceed with scheduled treatment today.  1135- Patient tolerated treatment well. Patient stable and discharged to home with Fluorouracil Infusion Pump in place.

## 2020-11-10 NOTE — Progress Notes (Signed)
Pt doing the same, no changes from his last visit per pt.

## 2020-11-12 ENCOUNTER — Inpatient Hospital Stay: Payer: Medicare HMO | Attending: Oncology

## 2020-11-12 DIAGNOSIS — D696 Thrombocytopenia, unspecified: Secondary | ICD-10-CM | POA: Diagnosis not present

## 2020-11-12 DIAGNOSIS — Z5111 Encounter for antineoplastic chemotherapy: Secondary | ICD-10-CM | POA: Insufficient documentation

## 2020-11-12 DIAGNOSIS — Z5189 Encounter for other specified aftercare: Secondary | ICD-10-CM | POA: Diagnosis not present

## 2020-11-12 DIAGNOSIS — G620 Drug-induced polyneuropathy: Secondary | ICD-10-CM | POA: Insufficient documentation

## 2020-11-12 DIAGNOSIS — Z452 Encounter for adjustment and management of vascular access device: Secondary | ICD-10-CM | POA: Insufficient documentation

## 2020-11-12 DIAGNOSIS — C786 Secondary malignant neoplasm of retroperitoneum and peritoneum: Secondary | ICD-10-CM | POA: Diagnosis not present

## 2020-11-12 DIAGNOSIS — C187 Malignant neoplasm of sigmoid colon: Secondary | ICD-10-CM | POA: Diagnosis present

## 2020-11-12 DIAGNOSIS — C772 Secondary and unspecified malignant neoplasm of intra-abdominal lymph nodes: Secondary | ICD-10-CM

## 2020-11-12 DIAGNOSIS — Z5112 Encounter for antineoplastic immunotherapy: Secondary | ICD-10-CM | POA: Insufficient documentation

## 2020-11-12 DIAGNOSIS — C189 Malignant neoplasm of colon, unspecified: Secondary | ICD-10-CM

## 2020-11-12 MED ORDER — HEPARIN SOD (PORK) LOCK FLUSH 100 UNIT/ML IV SOLN
500.0000 [IU] | Freq: Once | INTRAVENOUS | Status: AC | PRN
  Administered 2020-11-12: 500 [IU]
  Filled 2020-11-12: qty 5

## 2020-11-12 MED ORDER — SODIUM CHLORIDE 0.9% FLUSH
10.0000 mL | INTRAVENOUS | Status: DC | PRN
  Administered 2020-11-12: 10 mL
  Filled 2020-11-12: qty 10

## 2020-11-12 MED ORDER — PEGFILGRASTIM-CBQV 6 MG/0.6ML ~~LOC~~ SOSY
6.0000 mg | PREFILLED_SYRINGE | Freq: Once | SUBCUTANEOUS | Status: AC
  Administered 2020-11-12: 6 mg via SUBCUTANEOUS
  Filled 2020-11-12: qty 0.6

## 2020-11-12 MED ORDER — HEPARIN SOD (PORK) LOCK FLUSH 100 UNIT/ML IV SOLN
INTRAVENOUS | Status: AC
  Filled 2020-11-12: qty 5

## 2020-11-12 NOTE — Progress Notes (Signed)
5-FU CI complete, pump d/c. Udenyca given per orders. Discharged in stable condition.

## 2020-12-01 ENCOUNTER — Inpatient Hospital Stay (HOSPITAL_BASED_OUTPATIENT_CLINIC_OR_DEPARTMENT_OTHER): Payer: Medicare HMO | Admitting: Oncology

## 2020-12-01 ENCOUNTER — Inpatient Hospital Stay: Payer: Medicare HMO

## 2020-12-01 ENCOUNTER — Encounter: Payer: Self-pay | Admitting: Oncology

## 2020-12-01 VITALS — BP 124/73 | HR 72 | Temp 97.8°F | Resp 16 | Wt 146.8 lb

## 2020-12-01 DIAGNOSIS — Z5112 Encounter for antineoplastic immunotherapy: Secondary | ICD-10-CM

## 2020-12-01 DIAGNOSIS — C772 Secondary and unspecified malignant neoplasm of intra-abdominal lymph nodes: Secondary | ICD-10-CM

## 2020-12-01 DIAGNOSIS — G620 Drug-induced polyneuropathy: Secondary | ICD-10-CM | POA: Diagnosis not present

## 2020-12-01 DIAGNOSIS — C189 Malignant neoplasm of colon, unspecified: Secondary | ICD-10-CM

## 2020-12-01 DIAGNOSIS — T451X5A Adverse effect of antineoplastic and immunosuppressive drugs, initial encounter: Secondary | ICD-10-CM

## 2020-12-01 DIAGNOSIS — Z5111 Encounter for antineoplastic chemotherapy: Secondary | ICD-10-CM

## 2020-12-01 LAB — CBC WITH DIFFERENTIAL/PLATELET
Abs Immature Granulocytes: 0.04 10*3/uL (ref 0.00–0.07)
Basophils Absolute: 0.1 10*3/uL (ref 0.0–0.1)
Basophils Relative: 1 %
Eosinophils Absolute: 0.1 10*3/uL (ref 0.0–0.5)
Eosinophils Relative: 1 %
HCT: 38.7 % — ABNORMAL LOW (ref 39.0–52.0)
Hemoglobin: 13.3 g/dL (ref 13.0–17.0)
Immature Granulocytes: 0 %
Lymphocytes Relative: 18 %
Lymphs Abs: 1.9 10*3/uL (ref 0.7–4.0)
MCH: 31.9 pg (ref 26.0–34.0)
MCHC: 34.4 g/dL (ref 30.0–36.0)
MCV: 92.8 fL (ref 80.0–100.0)
Monocytes Absolute: 0.9 10*3/uL (ref 0.1–1.0)
Monocytes Relative: 8 %
Neutro Abs: 7.4 10*3/uL (ref 1.7–7.7)
Neutrophils Relative %: 72 %
Platelets: 110 10*3/uL — ABNORMAL LOW (ref 150–400)
RBC: 4.17 MIL/uL — ABNORMAL LOW (ref 4.22–5.81)
RDW: 15.9 % — ABNORMAL HIGH (ref 11.5–15.5)
WBC: 10.4 10*3/uL (ref 4.0–10.5)
nRBC: 0 % (ref 0.0–0.2)

## 2020-12-01 LAB — COMPREHENSIVE METABOLIC PANEL
ALT: 24 U/L (ref 0–44)
AST: 38 U/L (ref 15–41)
Albumin: 3.9 g/dL (ref 3.5–5.0)
Alkaline Phosphatase: 95 U/L (ref 38–126)
Anion gap: 6 (ref 5–15)
BUN: 26 mg/dL — ABNORMAL HIGH (ref 8–23)
CO2: 27 mmol/L (ref 22–32)
Calcium: 9 mg/dL (ref 8.9–10.3)
Chloride: 100 mmol/L (ref 98–111)
Creatinine, Ser: 1.04 mg/dL (ref 0.61–1.24)
GFR, Estimated: >60 mL/min (ref >60)
Glucose, Bld: 96 mg/dL (ref 70–99)
Potassium: 4.1 mmol/L (ref 3.5–5.1)
Sodium: 133 mmol/L — ABNORMAL LOW (ref 135–145)
Total Bilirubin: 0.9 mg/dL (ref 0.3–1.2)
Total Protein: 7.6 g/dL (ref 6.5–8.1)

## 2020-12-01 MED ORDER — SODIUM CHLORIDE 0.9 % IV SOLN
7.5000 mg/kg | Freq: Once | INTRAVENOUS | Status: AC
  Administered 2020-12-01: 500 mg via INTRAVENOUS
  Filled 2020-12-01: qty 4

## 2020-12-01 MED ORDER — DEXTROSE 5 % IV SOLN
Freq: Once | INTRAVENOUS | Status: DC
  Filled 2020-12-01: qty 250

## 2020-12-01 MED ORDER — LEUCOVORIN CALCIUM INJECTION 350 MG
700.0000 mg | Freq: Once | INTRAVENOUS | Status: AC
  Administered 2020-12-01: 700 mg via INTRAVENOUS
  Filled 2020-12-01: qty 35

## 2020-12-01 MED ORDER — SODIUM CHLORIDE 0.9 % IV SOLN
10.0000 mg | Freq: Once | INTRAVENOUS | Status: AC
  Administered 2020-12-01: 10 mg via INTRAVENOUS
  Filled 2020-12-01: qty 10

## 2020-12-01 MED ORDER — SODIUM CHLORIDE 0.9 % IV SOLN
INTRAVENOUS | Status: DC
  Filled 2020-12-01: qty 250

## 2020-12-01 MED ORDER — FLUOROURACIL CHEMO INJECTION 2.5 GM/50ML
400.0000 mg/m2 | Freq: Once | INTRAVENOUS | Status: AC
  Administered 2020-12-01: 700 mg via INTRAVENOUS
  Filled 2020-12-01: qty 14

## 2020-12-01 MED ORDER — FLUOROURACIL CHEMO INJECTION 5 GM/100ML
2400.0000 mg/m2 | INTRAVENOUS | Status: DC
  Administered 2020-12-01: 4300 mg via INTRAVENOUS
  Filled 2020-12-01: qty 86

## 2020-12-01 MED ORDER — SODIUM CHLORIDE 0.9% FLUSH
10.0000 mL | Freq: Once | INTRAVENOUS | Status: AC
  Administered 2020-12-01: 10 mL via INTRAVENOUS
  Filled 2020-12-01: qty 10

## 2020-12-01 MED ORDER — DEXAMETHASONE SODIUM PHOSPHATE 10 MG/ML IJ SOLN: 10.0000 mg | Freq: Once | INTRAMUSCULAR | Status: DC

## 2020-12-01 NOTE — Progress Notes (Signed)
Stable at discharge 

## 2020-12-01 NOTE — Progress Notes (Signed)
Hematology/Oncology Consult note [facility]  Telephone:([phone] [phone]  Patient Care Team: Jodi Marble, MD as PCP - General (Internal Medicine) Clent Jacks, RN as Oncology Nurse Navigator Sindy Guadeloupe, MD as Consulting Physician (Hematology and Oncology)   Name of the patient: Eric Simon  [id number]  18-Dec-1950   Date of visit: 12/01/20  Diagnosis- metastatic colon cancer with lymph node metastases K-ras/BRAF wild-type  Chief complaint/ Reason for visit-on treatment assessment prior to cycle 28 of 5-FU Mvasi chemotherapy  Heme/Onc history: Patient is a69yrold male with >40 pack year history of smoking. He currently smokes 0.5ppd. he presented to the ER with symptoms of left sided chest pain and left arm pain. Troponin was negative ekg was unremarkable. Ct chest showed no PE. He was incidentally noted to have mediastinal and retrocrural adenopathy and a 2.1X2.3X4.4 cm retroaortic soft tissue lesion in the posterior left chest. 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 Low-grade activity in the left lateral fifth and sixth ribs associated with nondisplaced fractures likely reflecting healing response problem malignancy.  Patient underwent CT-guided biopsy of the retroperitoneal lymph node pathology showed metastatic adenocarcinoma compatible with colorectal origin. CK7 negative. CK20 positive. CDX 2+. TTF-1 negative. PSA negative. This pattern of immunoreactivity supports the above diagnosis. Patient underwent colonoscopy which showed sigmoid mass that was consistent with adenocarcinoma. RASpanel testing showed that he was wild-type for both K-ras and BRAF  FOLFOX and bevacizumab chemotherapy started in July 2020. Subsequently oxaliplatin has been on hold given neuropathy.   Interval  history-patient reports doing well and denies any complaints at this time.  He has mild neuropathy in his hands and feet which is remained stable.  Denies any pain or changes in his bowel movements.  ECOG PS- 1 Pain scale- 0 Opioid associated constipation- no  Review of systems- ROS    No Known Allergies   Past Medical History:  Diagnosis Date  . Colon cancer ([place]   . [place] disease) [place]   . Hyperlipidemia      Past Surgical History:  Procedure Laterality Date  . COLONOSCOPY WITH PROPOFOL N/A 06/26/2019   Procedure: COLONOSCOPY WITH PROPOFOL;  Surgeon: AJonathon Bellows MD;  [facility];  Service: Gastroenterology;  Laterality: N/A;  . PORTA CATH INSERTION N/A 06/25/2019   Procedure: PORTA CATH INSERTION;  Surgeon: DAlgernon Huxley MD;  [facility];  Service: Cardiovascular;  Laterality: N/A;  . PORTA CATH INSERTION Left 08/11/2020   Procedure: PORTA CATH INSERTION;  Surgeon: DAlgernon Huxley MD;  [facility];  Service: Cardiovascular;  Laterality: Left;  . PORTA CATH REMOVAL Right 08/11/2020   Procedure: PORTA CATH REMOVAL;  Surgeon: DAlgernon Huxley MD;  [facility];  Service: Cardiovascular;  Laterality: Right;    Social History   Socioeconomic History  . Marital status: Single    Spouse name: Not on file  . Number of children: Not on file  . Years of education: Not on file  . Highest education level: Not on file  Occupational History  . Not on file  Tobacco Use  . Smoking status: Current Every Day Smoker    Packs/day: 0.50    Years: 40.00    Pack years: 20.00    Types: Cigarettes  . Smokeless tobacco: Never Used  . Tobacco comment: smoking 40 years  Vaping Use  . Vaping Use: Never used  Substance and Sexual Activity  . Alcohol use: No  . Drug use: No  . Sexual activity: Not Currently  Other Topics Concern  . Not on file  Social History Narrative  . Not on file   Social  Determinants of Health   Financial Resource Strain: Not on file  Food Insecurity: Not on file  Transportation Needs: Not on file  Physical Activity: Not on file  Stress: Not on file  Social Connections: Not on file  Intimate Partner Violence: Not on file    Family History  Problem Relation Age of Onset  . Brain cancer Father      Current Outpatient Medications:  .  aspirin EC 81 MG tablet, Take 81 mg by mouth daily., Disp: , Rfl:  .  azelastine (ASTELIN) 0.1 % nasal spray, Place 1 spray into both nostrils 1 day or 1 dose., Disp: , Rfl:  .  butalbital-acetaminophen-caffeine (FIORICET) 50-325-40 MG tablet, Take 1 tablet by mouth every 4 (four) hours as needed., Disp: , Rfl:  .  cetirizine (ZYRTEC) 10 MG tablet, Take 1 tablet (10 mg total) by mouth daily. (Patient taking differently: Take 10 mg by mouth daily as needed.), Disp: 30 tablet, Rfl: 0 .  chlorthalidone (HYGROTON) 25 MG tablet, Take 1 tablet by mouth daily., Disp: , Rfl:  .  DEXILANT 60 MG capsule, Take 1 capsule by mouth 1 day or 1 dose., Disp: , Rfl:  .  DULoxetine (CYMBALTA) 30 MG capsule, Take 1 capsule (30 mg total) by mouth 2 (two) times daily., Disp: 60 capsule, Rfl: 1 .  fluticasone (FLONASE) 50 MCG/ACT nasal spray, Place 2 sprays into both nostrils 1 day or 1 dose. , Disp: , Rfl:  .  lidocaine-prilocaine (EMLA) cream, Apply to affected area once, Disp: 30 g, Rfl: 3 .  montelukast (SINGULAIR) 10 MG tablet, Take 10 mg by mouth at bedtime. , Disp: , Rfl:  .  pantoprazole (PROTONIX) 40 MG tablet, Take 40 mg by mouth daily. , Disp: , Rfl:  .  pregabalin (LYRICA) 75 MG capsule, Take 1 capsule (75 mg total) by mouth 2 (two) times daily., Disp: 60 capsule, Rfl: 2 .  simvastatin (ZOCOR) 20 MG tablet, Take 20 mg by mouth daily. , Disp: , Rfl:  .  SYMBICORT 80-4.5 MCG/ACT inhaler, Inhale 2 puffs into the lungs 1 day or 1 dose. , Disp: , Rfl:  .  tamsulosin (FLOMAX) 0.4 MG CAPS capsule, Take 0.4 mg by mouth. , Disp: , Rfl:  .   dexamethasone (DECADRON) 4 MG tablet, Take 2 tablets (8 mg total) by mouth daily. Start the day after chemotherapy for 2 days. Take with food. (Patient not taking: No sig reported), Disp: 30 tablet, Rfl: 1 .  fluticasone (VERAMYST) 27.5 MCG/SPRAY nasal spray, Place 2 sprays into the nose daily.  (Patient not taking: Reported on 12/01/2020), Disp: , Rfl:  .  magic mouthwash w/lidocaine SOLN, Take 5-10 mLs by mouth 4 (four) times daily as needed for mouth pain. (Patient not taking: No sig reported), Disp: 480 mL, Rfl: 3 .  nicotine (NICODERM CQ - DOSED IN MG/24 HOURS) 21 mg/24hr patch, Place 1 patch onto the skin 1 day or 1 dose.  (Patient not taking: No sig reported), Disp: , Rfl:  .  ondansetron (ZOFRAN) 8 MG tablet, Take 1 tablet (8 mg total) by mouth 2 (two) times daily as needed for refractory nausea / vomiting. Start on day 3 after chemotherapy. (Patient  not taking: No sig reported), Disp: 30 tablet, Rfl: 1 .  oxyCODONE (OXY IR/ROXICODONE) 5 MG immediate release tablet, Take 1 tablet (5 mg total) by mouth every 6 (six) hours as needed for severe pain. (Patient not taking: No sig reported), Disp: 30 tablet, Rfl: 0 .  potassium chloride SA (K-DUR) 20 MEQ tablet, Take 1 tablet (20 mEq total) by mouth daily. (Patient not taking: No sig reported), Disp: 14 tablet, Rfl: 0 .  prochlorperazine (COMPAZINE) 10 MG tablet, Take 1 tablet (10 mg total) by mouth every 6 (six) hours as needed (Nausea or vomiting). (Patient not taking: No sig reported), Disp: 30 tablet, Rfl: 1 No current facility-administered medications for this visit.  Facility-Administered Medications Ordered in Other Visits:  .  0.9 %  sodium chloride infusion, , Intravenous, Continuous, Sindy Guadeloupe, MD, Stopped at 12/01/20 1120 .  dextrose 5 % solution, , Intravenous, Once, Sindy Guadeloupe, MD .  fluorouracil (ADRUCIL) 4,300 mg in sodium chloride 0.9 % 64 mL chemo infusion, 2,400 mg/m2 (Treatment Plan Recorded), Intravenous, 1 day or 1 dose,  Sindy Guadeloupe, MD, 4,300 mg at 12/01/20 1123 .  sodium chloride flush (NS) 0.9 % injection 10 mL, 10 mL, Intracatheter, PRN, Sindy Guadeloupe, MD, 10 mL at 08/15/19 1405  Physical exam:  Vitals:   12/01/20 0848  BP: 124/73  Pulse: 72  Resp: 16  Temp: 97.8 F (36.6 C)  Weight: 146 lb 12.8 oz (66.6 kg)   Physical Exam   CMP Latest Ref Rng & Units 12/01/2020  Glucose 70 - 99 mg/dL 96  BUN 8 - 23 mg/dL 26(H)  Creatinine 0.61 - 1.24 mg/dL 1.04  Sodium 135 - 145 mmol/L 133(L)  Potassium 3.5 - 5.1 mmol/L 4.1  Chloride 98 - 111 mmol/L 100  CO2 22 - 32 mmol/L 27  Calcium 8.9 - 10.3 mg/dL 9.0  Total Protein 6.5 - 8.1 g/dL 7.6  Total Bilirubin 0.3 - 1.2 mg/dL 0.9  Alkaline Phos 38 - 126 U/L 95  AST 15 - 41 U/L 38  ALT 0 - 44 U/L 24   CBC Latest Ref Rng & Units 12/01/2020  WBC 4.0 - 10.5 K/uL 10.4  Hemoglobin 13.0 - 17.0 g/dL 13.3  Hematocrit 39.0 - 52.0 % 38.7(L)  Platelets 150 - 400 K/uL 110(L)    No images are attached to the encounter.  No results found.   Assessment and plan- Patient is a 69 y.o. male with history of metastatic colon cancer presently on 5-FU Mvasi chemotherapy here for on treatment assessment prior to cycle 28.  Counts okay to proceed with cycle 28 5-FU Mvasi chemotherapy today with primary AC on day 3 and receives Udenyca.  I will see him in 3 weeks for cycle 29.  Plan to get CT chest abdomen pelvis with contrast prior.  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  Continue to monitor   Visit Diagnosis 1. 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 ([place])   2. Encounter for antineoplastic chemotherapy   3. Encounter for monoclonal antibody treatment for malignancy   4. Chemotherapy-induced peripheral neuropathy ([place])      Dr. Randa Evens, MD, MPH [facility] at [facility] [phone] 12/01/2020 12:52  PM

## 2020-12-01 NOTE — Progress Notes (Signed)
Patient's neuropathy is stable.

## 2020-12-03 ENCOUNTER — Inpatient Hospital Stay: Payer: Medicare HMO

## 2020-12-03 ENCOUNTER — Other Ambulatory Visit: Payer: Self-pay

## 2020-12-03 VITALS — BP 116/70 | HR 73 | Temp 98.6°F | Resp 18

## 2020-12-03 DIAGNOSIS — C189 Malignant neoplasm of colon, unspecified: Secondary | ICD-10-CM

## 2020-12-03 DIAGNOSIS — Z5112 Encounter for antineoplastic immunotherapy: Secondary | ICD-10-CM | POA: Diagnosis not present

## 2020-12-03 MED ORDER — HEPARIN SOD (PORK) LOCK FLUSH 100 UNIT/ML IV SOLN
INTRAVENOUS | Status: AC
  Filled 2020-12-03: qty 5

## 2020-12-03 MED ORDER — SODIUM CHLORIDE 0.9% FLUSH
10.0000 mL | INTRAVENOUS | Status: DC | PRN
  Administered 2020-12-03: 10 mL
  Filled 2020-12-03: qty 10

## 2020-12-03 MED ORDER — PEGFILGRASTIM-CBQV 6 MG/0.6ML ~~LOC~~ SOSY
6.0000 mg | PREFILLED_SYRINGE | Freq: Once | SUBCUTANEOUS | Status: AC
  Administered 2020-12-03: 6 mg via SUBCUTANEOUS
  Filled 2020-12-03: qty 0.6

## 2020-12-03 MED ORDER — HEPARIN SOD (PORK) LOCK FLUSH 100 UNIT/ML IV SOLN
500.0000 [IU] | Freq: Once | INTRAVENOUS | Status: AC | PRN
  Administered 2020-12-03: 500 [IU]
  Filled 2020-12-03: qty 5

## 2020-12-03 NOTE — Progress Notes (Signed)
[id number]- Patient here for Fluorouracil Pump disconnect and Udenyca injection today. Patient and vital signs stable. Patient discharged to home at this time.

## 2020-12-09 ENCOUNTER — Other Ambulatory Visit: Payer: Self-pay

## 2020-12-09 ENCOUNTER — Inpatient Hospital Stay (HOSPITAL_BASED_OUTPATIENT_CLINIC_OR_DEPARTMENT_OTHER): Payer: Medicare HMO | Admitting: Hospice and Palliative Medicine

## 2020-12-09 DIAGNOSIS — Z515 Encounter for palliative care: Secondary | ICD-10-CM

## 2020-12-09 NOTE — Progress Notes (Signed)
I was unable to reach patient for scheduled MyChart visit.  Will reschedule. 

## 2020-12-16 ENCOUNTER — Other Ambulatory Visit: Payer: Self-pay

## 2020-12-16 ENCOUNTER — Ambulatory Visit
Admission: RE | Admit: 2020-12-16 | Discharge: 2020-12-16 | Disposition: A | Discharge status: Home / Self Care | Payer: Medicare HMO | Source: Ambulatory Visit | Attending: Oncology | Admitting: Oncology

## 2020-12-16 DIAGNOSIS — C189 Malignant neoplasm of colon, unspecified: Secondary | ICD-10-CM

## 2020-12-16 DIAGNOSIS — Z5111 Encounter for antineoplastic chemotherapy: Secondary | ICD-10-CM | POA: Insufficient documentation

## 2020-12-16 DIAGNOSIS — C772 Secondary and unspecified malignant neoplasm of intra-abdominal lymph nodes: Secondary | ICD-10-CM | POA: Insufficient documentation

## 2020-12-16 MED ORDER — IOHEXOL 300 MG/ML  SOLN
100.0000 mL | Freq: Once | INTRAMUSCULAR | Status: AC | PRN
  Administered 2020-12-16: 100 mL via INTRAVENOUS

## 2020-12-21 ENCOUNTER — Other Ambulatory Visit: Payer: Self-pay | Admitting: *Deleted

## 2020-12-21 DIAGNOSIS — C189 Malignant neoplasm of colon, unspecified: Secondary | ICD-10-CM

## 2020-12-21 DIAGNOSIS — C772 Secondary and unspecified malignant neoplasm of intra-abdominal lymph nodes: Secondary | ICD-10-CM

## 2020-12-21 NOTE — Progress Notes (Signed)
Hematology/Oncology Consult note [facility]  Telephone:([phone] [phone]  Patient Care Team: Jodi Marble, MD as PCP - General (Internal Medicine) Clent Jacks, RN as Oncology Nurse Navigator Sindy Guadeloupe, MD as Consulting Physician (Hematology and Oncology)   Name of the patient: Eric Simon  [id number]  [birth year]   Date of visit: 12/21/20  Diagnosis- metastatic colon cancer with lymph node metastases K-ras/BRAF wild-type   Chief complaint/ Reason for visit- on treatment assessment prior to cycle 29 of 5FU mvasi chemotherapy  Heme/Onc history: Patient is a70yrold male with >40 pack year history of smoking. He currently smokes 0.5ppd. he presented to the ER with symptoms of left sided chest pain and left arm pain. Troponin was negative ekg was unremarkable. Ct chest showed no PE. He was incidentally noted to have mediastinal and retrocrural adenopathy and a 2.1X2.3X4.4 cm retroaortic soft tissue lesion in the posterior left chest. 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 Low-grade activity in the left lateral fifth and sixth ribs associated with nondisplaced fractures likely reflecting healing response problem malignancy.  Patient underwent CT-guided biopsy of the retroperitoneal lymph node pathology showed metastatic adenocarcinoma compatible with colorectal origin. CK7 negative. CK20 positive. CDX 2+. TTF-1 negative. PSA negative. This pattern of immunoreactivity supports the above diagnosis. Patient underwent colonoscopy which showed sigmoid mass that was consistent with adenocarcinoma. RASpanel testing showed that he was wild-type for both K-ras and BRAF  FOLFOX and bevacizumab chemotherapy started in July 2020. Subsequently oxaliplatin has been on hold given neuropathy.   Interval  history-patient is doing well and denies any complaints at this time.  Peripheral neuropathy is mild and stable.  Denies any nausea vomiting or diarrhea  ECOG PS- 1 Pain scale- 0 Opioid associated constipation- no  Review of systems- Review of Systems  Constitutional: Positive for malaise/fatigue. Negative for chills, fever and weight loss.  HENT: Negative for congestion, ear discharge and nosebleeds.   Eyes: Negative for blurred vision.  Respiratory: Negative for cough, hemoptysis, sputum production, shortness of breath and wheezing.   Cardiovascular: Negative for chest pain, palpitations, orthopnea and claudication.  Gastrointestinal: Negative for abdominal pain, blood in stool, constipation, diarrhea, heartburn, melena, nausea and vomiting.  Genitourinary: Negative for dysuria, flank pain, frequency, hematuria and urgency.  Musculoskeletal: Negative for back pain, joint pain and myalgias.  Skin: Negative for rash.  Neurological: Negative for dizziness, tingling, focal weakness, seizures, weakness and headaches.  Endo/Heme/Allergies: Does not bruise/bleed easily.  Psychiatric/Behavioral: Negative for depression and suicidal ideas. The patient does not have insomnia.        No Known Allergies   Past Medical History:  Diagnosis Date   [place]    [place] [place]    Hyperlipidemia      Past Surgical History:  Procedure Laterality Date   COLONOSCOPY WITH PROPOFOL N/A 06/26/2019   Procedure: COLONOSCOPY WITH PROPOFOL;  Surgeon: AJonathon Bellows MD;  [facility];  Service: Gastroenterology;  Laterality: N/A;   PORTA CATH INSERTION N/A 06/25/2019   Procedure: PORTA CATH INSERTION;  Surgeon: DAlgernon Huxley MD;  [facility];  Service: Cardiovascular;  Laterality: N/A;   PORTA CATH INSERTION Left 08/11/2020   Procedure: PORTA CATH INSERTION;  Surgeon: DAlgernon Huxley MD;  [facility];   Service: Cardiovascular;  Laterality: Left;   PORTA CATH REMOVAL  Right 08/11/2020   Procedure: PORTA CATH REMOVAL;  Surgeon: Algernon Huxley, MD;  [facility];  Service: Cardiovascular;  Laterality: Right;    Social History   Socioeconomic History   Marital status: Single    Spouse name: Not on file   Number of children: Not on file   Years of education: Not on file   Highest education level: Not on file  Occupational History   Not on file  Tobacco Use   Smoking status: Current Every Day Smoker    Packs/day: 0.50    Years: 40.00    Pack years: 20.00    Types: Cigarettes   Smokeless tobacco: Never Used   Tobacco comment: smoking 40 years  Vaping Use   Vaping Use: Never used  Substance and Sexual Activity   Alcohol use: No   Drug use: No   Sexual activity: Not Currently  Other Topics Concern   Not on file  Social History Narrative   Not on file   Social Determinants of Health   Financial Resource Strain: Not on file  Food Insecurity: Not on file  Transportation Needs: Not on file  Physical Activity: Not on file  Stress: Not on file  Social Connections: Not on file  Intimate Partner Violence: Not on file    Family History  Problem Relation Age of Onset   Brain cancer Father      Current Outpatient Medications:    aspirin EC 81 MG tablet, Take 81 mg by mouth daily., Disp: , Rfl:    azelastine (ASTELIN) 0.1 % nasal spray, Place 1 spray into both nostrils 1 day or 1 dose., Disp: , Rfl:    butalbital-acetaminophen-caffeine (FIORICET) 50-325-40 MG tablet, Take 1 tablet by mouth every 4 (four) hours as needed., Disp: , Rfl:    cetirizine (ZYRTEC) 10 MG tablet, Take 1 tablet (10 mg total) by mouth daily. (Patient taking differently: Take 10 mg by mouth daily as needed.), Disp: 30 tablet, Rfl: 0   chlorthalidone (HYGROTON) 25 MG tablet, Take 1 tablet by mouth daily., Disp: , Rfl:    dexamethasone (DECADRON) 4 MG tablet, Take 2 tablets  (8 mg total) by mouth daily. Start the day after chemotherapy for 2 days. Take with food. (Patient not taking: No sig reported), Disp: 30 tablet, Rfl: 1   DEXILANT 60 MG capsule, Take 1 capsule by mouth 1 day or 1 dose., Disp: , Rfl:    DULoxetine (CYMBALTA) 30 MG capsule, Take 1 capsule (30 mg total) by mouth 2 (two) times daily., Disp: 60 capsule, Rfl: 1   fluticasone (FLONASE) 50 MCG/ACT nasal spray, Place 2 sprays into both nostrils 1 day or 1 dose. , Disp: , Rfl:    fluticasone (VERAMYST) 27.5 MCG/SPRAY nasal spray, Place 2 sprays into the nose daily.  (Patient not taking: Reported on 12/01/2020), Disp: , Rfl:    lidocaine-prilocaine (EMLA) cream, Apply to affected area once, Disp: 30 g, Rfl: 3   magic mouthwash w/lidocaine SOLN, Take 5-10 mLs by mouth 4 (four) times daily as needed for mouth pain. (Patient not taking: No sig reported), Disp: 480 mL, Rfl: 3   montelukast (SINGULAIR) 10 MG tablet, Take 10 mg by mouth at bedtime. , Disp: , Rfl:    nicotine (NICODERM CQ - DOSED IN MG/24 HOURS) 21 mg/24hr patch, Place 1 patch onto the skin 1 day or 1 dose.  (Patient not taking: No sig reported), Disp: , Rfl:    ondansetron (ZOFRAN) 8 MG tablet, Take  1 tablet (8 mg total) by mouth 2 (two) times daily as needed for refractory nausea / vomiting. Start on day 3 after chemotherapy. (Patient not taking: No sig reported), Disp: 30 tablet, Rfl: 1   oxyCODONE (OXY IR/ROXICODONE) 5 MG immediate release tablet, Take 1 tablet (5 mg total) by mouth every 6 (six) hours as needed for severe pain. (Patient not taking: No sig reported), Disp: 30 tablet, Rfl: 0   pantoprazole (PROTONIX) 40 MG tablet, Take 40 mg by mouth daily. , Disp: , Rfl:    potassium chloride SA (K-DUR) 20 MEQ tablet, Take 1 tablet (20 mEq total) by mouth daily. (Patient not taking: No sig reported), Disp: 14 tablet, Rfl: 0   pregabalin (LYRICA) 75 MG capsule, Take 1 capsule (75 mg total) by mouth 2 (two) times daily., Disp: 60 capsule,  Rfl: 2   prochlorperazine (COMPAZINE) 10 MG tablet, Take 1 tablet (10 mg total) by mouth every 6 (six) hours as needed (Nausea or vomiting). (Patient not taking: No sig reported), Disp: 30 tablet, Rfl: 1   simvastatin (ZOCOR) 20 MG tablet, Take 20 mg by mouth daily. , Disp: , Rfl:    SYMBICORT 80-4.5 MCG/ACT inhaler, Inhale 2 puffs into the lungs 1 day or 1 dose. , Disp: , Rfl:    tamsulosin (FLOMAX) 0.4 MG CAPS capsule, Take 0.4 mg by mouth. , Disp: , Rfl:  No current facility-administered medications for this visit.  Facility-Administered Medications Ordered in Other Visits:    sodium chloride flush (NS) 0.9 % injection 10 mL, 10 mL, Intracatheter, PRN, Sindy Guadeloupe, MD, 10 mL at 08/15/19 1405  Physical exam:  Vitals:   12/22/20 0908  BP: 133/74  Pulse: 73  Resp: 18  Temp: 98.1 F (36.7 C)  TempSrc: Tympanic  SpO2: 100%  Weight: 146 lb 4.8 oz (66.4 kg)   Physical Exam HENT:     Mouth/Throat:     Pharynx: No posterior oropharyngeal erythema.  Eyes:     Extraocular Movements: EOM normal.  Cardiovascular:     Rate and Rhythm: Normal rate and regular rhythm.     Heart sounds: Normal heart sounds.  Pulmonary:     Effort: Pulmonary effort is normal.     Breath sounds: Normal breath sounds.  Skin:    General: Skin is warm and dry.  Neurological:     Mental Status: He is alert and oriented to person, place, and time.      CMP Latest Ref Rng & Units 12/01/2020  Glucose 70 - 99 mg/dL 96  BUN 8 - 23 mg/dL 26(H)  Creatinine 0.61 - 1.24 mg/dL 1.04  Sodium 135 - 145 mmol/L 133(L)  Potassium 3.5 - 5.1 mmol/L 4.1  Chloride 98 - 111 mmol/L 100  CO2 22 - 32 mmol/L 27  Calcium 8.9 - 10.3 mg/dL 9.0  Total Protein 6.5 - 8.1 g/dL 7.6  Total Bilirubin 0.3 - 1.2 mg/dL 0.9  Alkaline Phos 38 - 126 U/L 95  AST 15 - 41 U/L 38  ALT 0 - 44 U/L 24   CBC Latest Ref Rng & Units 12/01/2020  WBC 4.0 - 10.5 K/uL 10.4  Hemoglobin 13.0 - 17.0 g/dL 13.3  Hematocrit 39.0 - 52.0 % 38.7(L)   Platelets 150 - 400 K/uL 110(L)    No images are attached to the encounter.  CT CHEST ABDOMEN PELVIS W CONTRAST  Result Date: 12/16/2020 CLINICAL DATA:  Colon cancer restaging, ongoing chemotherapy EXAM: CT CHEST, ABDOMEN, AND PELVIS WITH CONTRAST TECHNIQUE: Multidetector CT  imaging of the chest, abdomen and pelvis was performed following the standard protocol during bolus administration of intravenous contrast. CONTRAST:  176m OMNIPAQUE IOHEXOL 300 MG/ML SOLN, additional oral enteric contrast COMPARISON:  08/27/2020 FINDINGS: CT CHEST FINDINGS Cardiovascular: Left chest port catheter. Aortic atherosclerosis. Normal heart size. Left coronary artery calcifications. No pericardial effusion. Mediastinum/Nodes: No enlarged mediastinal, hilar, or axillary lymph nodes. Thyroid gland, trachea, and esophagus demonstrate no significant findings. Lungs/Pleura: Moderate to severe centrilobular emphysema. Dependent bibasilar partial atelectasis and/or scarring. No pleural effusion or pneumothorax. Musculoskeletal: No chest wall mass or suspicious bone lesions identified. CT ABDOMEN PELVIS FINDINGS Hepatobiliary: No solid liver abnormality is seen. No gallstones, gallbladder wall thickening, or biliary dilatation. Pancreas: Unremarkable. No pancreatic ductal dilatation or surrounding inflammatory changes. Spleen: Unchanged splenomegaly, maximum span 16.6 cm. Adrenals/Urinary Tract: Adrenal glands are unremarkable. Kidneys are normal, without renal calculi, solid lesion, or hydronephrosis. Bladder is unremarkable. Stomach/Bowel: Stomach is within normal limits. Appendix appears normal. Unchanged mild circumferential wall thickening of the rectum (series 2, image 110) with fat stranding of the perirectal soft tissues. Vascular/Lymphatic: Aortic atherosclerosis. No significant interval change in enlarged left iliac and pelvic sidewall lymph nodes, an index node measuring 1.4 x 1.1 cm (series 2, image 106). Slight interval  increase in size of high left iliac lymph nodes, largest node measuring 0.9 x 0.9 cm, previously 0.6 x 0.5 cm (series 2, image 85). There is a newly enlarged, although subcentimeter left retroperitoneal lymph node measuring 0.7 x 0.7 cm (series 2, image 69). Reproductive: Mild prostatomegaly. Other: No abdominal wall hernia or abnormality. No abdominopelvic ascites. Musculoskeletal: No acute or significant osseous findings. IMPRESSION: 1. Slight interval increase in size of high left iliac lymph nodes. There is a newly enlarged, although subcentimeter left retroperitoneal lymph node. Findings are consistent with worsened nodal metastatic disease. 2. Other left iliac and pelvic sidewall lymph nodes are unchanged. 3. Unchanged mild circumferential wall thickening of the rectum with fat stranding of the perirectal soft tissues, in keeping with known primary malignancy. 4. No evidence of distant metastatic disease within the chest or organ metastatic disease in the abdomen. 5. Emphysema. 6. Coronary artery disease. Aortic Atherosclerosis (ICD10-I70.0) and Emphysema (ICD10-J43.9). Electronically Signed   By: AEddie CandleM.D.   On: 12/16/2020 09:10     Assessment and plan- Patient is a 70y.o. male with metastatic colon cancer here for on treatment assessment prior to cycle 29 of 5-FU Mvasi chemotherapy  I have reviewed CT chest abdomen and images Independently and discussed findings with the patient.  Overall patient has low-volume disease in the last been mild increase in the size of intra-abdominal lymph nodes from 0.9 cm to 1.4 cm.  There was a solitary left retroperitoneal lymph node that was new as compared to prior imaging which was 0.7 cm.  However given that he has a low disease burden at this time I would like to continue his present treatment without adding irinotecan.  I will plan to get a repeat scan in 3 to 4 months.  And if there is a continued increase in the size and number of lymph nodes, I will  consider adding irinotecan to his regimen at that time  counts okay to proceed with cycle 29 of 5-FU Mvasi chemotherapy today and I will see him back in 3 weeks with CBC with differential, CMP for cycle 30.  He comes back on day 3 for pump disconnect and receives udenyca  Blood pressure is stable and urine protein from today  is currently pending and otherwise acceptable to proceed with Mvasi today   Visit Diagnosis 1. 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 ([place])   2. Encounter for antineoplastic chemotherapy   3. Encounter for monoclonal antibody treatment for malignancy      Dr. Randa Evens, MD, MPH [facility] [phone] 12/22/2020 9:23 AM

## 2020-12-22 ENCOUNTER — Inpatient Hospital Stay: Payer: Medicare HMO | Attending: Oncology

## 2020-12-22 ENCOUNTER — Inpatient Hospital Stay: Payer: Medicare HMO

## 2020-12-22 ENCOUNTER — Inpatient Hospital Stay (HOSPITAL_BASED_OUTPATIENT_CLINIC_OR_DEPARTMENT_OTHER): Payer: Medicare HMO | Admitting: Oncology

## 2020-12-22 ENCOUNTER — Encounter: Payer: Self-pay | Admitting: Oncology

## 2020-12-22 VITALS — BP 133/74 | HR 73 | Temp 98.1°F | Resp 18 | Wt 146.3 lb

## 2020-12-22 DIAGNOSIS — C772 Secondary and unspecified malignant neoplasm of intra-abdominal lymph nodes: Secondary | ICD-10-CM

## 2020-12-22 DIAGNOSIS — Z452 Encounter for adjustment and management of vascular access device: Secondary | ICD-10-CM | POA: Insufficient documentation

## 2020-12-22 DIAGNOSIS — Z5111 Encounter for antineoplastic chemotherapy: Secondary | ICD-10-CM

## 2020-12-22 DIAGNOSIS — C189 Malignant neoplasm of colon, unspecified: Secondary | ICD-10-CM

## 2020-12-22 DIAGNOSIS — C786 Secondary malignant neoplasm of retroperitoneum and peritoneum: Secondary | ICD-10-CM | POA: Diagnosis not present

## 2020-12-22 DIAGNOSIS — Z5112 Encounter for antineoplastic immunotherapy: Secondary | ICD-10-CM | POA: Diagnosis present

## 2020-12-22 DIAGNOSIS — Z5189 Encounter for other specified aftercare: Secondary | ICD-10-CM | POA: Diagnosis not present

## 2020-12-22 DIAGNOSIS — C187 Malignant neoplasm of sigmoid colon: Secondary | ICD-10-CM | POA: Diagnosis present

## 2020-12-22 DIAGNOSIS — G620 Drug-induced polyneuropathy: Secondary | ICD-10-CM | POA: Diagnosis not present

## 2020-12-22 LAB — CBC WITH DIFFERENTIAL/PLATELET
Abs Immature Granulocytes: 0.05 10*3/uL (ref 0.00–0.07)
Basophils Absolute: 0.1 10*3/uL (ref 0.0–0.1)
Basophils Relative: 1 %
Eosinophils Absolute: 0.1 10*3/uL (ref 0.0–0.5)
Eosinophils Relative: 1 %
HCT: 39.3 % (ref 39.0–52.0)
Hemoglobin: 13.7 g/dL (ref 13.0–17.0)
Immature Granulocytes: 1 %
Lymphocytes Relative: 19 %
Lymphs Abs: 2 10*3/uL (ref 0.7–4.0)
MCH: 31.9 pg (ref 26.0–34.0)
MCHC: 34.9 g/dL (ref 30.0–36.0)
MCV: 91.6 fL (ref 80.0–100.0)
Monocytes Absolute: 0.7 10*3/uL (ref 0.1–1.0)
Monocytes Relative: 7 %
Neutro Abs: 7.5 10*3/uL (ref 1.7–7.7)
Neutrophils Relative %: 71 %
Platelets: 115 10*3/uL — ABNORMAL LOW (ref 150–400)
RBC: 4.29 MIL/uL (ref 4.22–5.81)
RDW: 15.8 % — ABNORMAL HIGH (ref 11.5–15.5)
WBC: 10.4 10*3/uL (ref 4.0–10.5)
nRBC: 0 % (ref 0.0–0.2)

## 2020-12-22 LAB — COMPREHENSIVE METABOLIC PANEL WITH GFR
ALT: 32 U/L (ref 0–44)
AST: 40 U/L (ref 15–41)
Albumin: 3.8 g/dL (ref 3.5–5.0)
Alkaline Phosphatase: 119 U/L (ref 38–126)
Anion gap: 7 (ref 5–15)
BUN: 19 mg/dL (ref 8–23)
CO2: 23 mmol/L (ref 22–32)
Calcium: 8.9 mg/dL (ref 8.9–10.3)
Chloride: 102 mmol/L (ref 98–111)
Creatinine, Ser: 0.69 mg/dL (ref 0.61–1.24)
GFR, Estimated: >60 mL/min (ref >60)
Glucose, Bld: 93 mg/dL (ref 70–99)
Potassium: 3.7 mmol/L (ref 3.5–5.1)
Sodium: 132 mmol/L — ABNORMAL LOW (ref 135–145)
Total Bilirubin: 0.8 mg/dL (ref 0.3–1.2)
Total Protein: 7.3 g/dL (ref 6.5–8.1)

## 2020-12-22 LAB — PROTEIN, URINE, RANDOM: Total Protein, Urine: 14 mg/dL

## 2020-12-22 MED ORDER — SODIUM CHLORIDE 0.9 % IV SOLN
7.5000 mg/kg | Freq: Once | INTRAVENOUS | Status: AC
  Administered 2020-12-22: 500 mg via INTRAVENOUS
  Filled 2020-12-22: qty 16

## 2020-12-22 MED ORDER — DEXAMETHASONE SODIUM PHOSPHATE 10 MG/ML IJ SOLN: 10.0000 mg | Freq: Once | INTRAMUSCULAR | Status: DC

## 2020-12-22 MED ORDER — SODIUM CHLORIDE 0.9 % IV SOLN
700.0000 mg | Freq: Once | INTRAVENOUS | Status: AC
  Administered 2020-12-22: 700 mg via INTRAVENOUS
  Filled 2020-12-22: qty 35

## 2020-12-22 MED ORDER — SODIUM CHLORIDE 0.9 % IV SOLN
10.0000 mg | Freq: Once | INTRAVENOUS | Status: AC
  Administered 2020-12-22: 10 mg via INTRAVENOUS
  Filled 2020-12-22: qty 10

## 2020-12-22 MED ORDER — FLUOROURACIL CHEMO INJECTION 2.5 GM/50ML
400.0000 mg/m2 | Freq: Once | INTRAVENOUS | Status: AC
  Administered 2020-12-22: 700 mg via INTRAVENOUS
  Filled 2020-12-22: qty 14

## 2020-12-22 MED ORDER — SODIUM CHLORIDE 0.9 % IV SOLN
INTRAVENOUS | Status: DC
  Filled 2020-12-22: qty 250

## 2020-12-22 MED ORDER — SODIUM CHLORIDE 0.9 % IV SOLN
2400.0000 mg/m2 | INTRAVENOUS | Status: DC
  Administered 2020-12-22: 4300 mg via INTRAVENOUS
  Filled 2020-12-22: qty 86

## 2020-12-22 NOTE — Addendum Note (Signed)
Addended by: Kern Alberta on: 12/22/2020 09:44 AM   Modules accepted: Orders

## 2020-12-24 ENCOUNTER — Inpatient Hospital Stay: Payer: Medicare HMO

## 2020-12-24 ENCOUNTER — Other Ambulatory Visit: Payer: Self-pay

## 2020-12-24 DIAGNOSIS — Z5112 Encounter for antineoplastic immunotherapy: Secondary | ICD-10-CM | POA: Diagnosis not present

## 2020-12-24 DIAGNOSIS — C189 Malignant neoplasm of colon, unspecified: Secondary | ICD-10-CM

## 2020-12-24 DIAGNOSIS — C772 Secondary and unspecified malignant neoplasm of intra-abdominal lymph nodes: Secondary | ICD-10-CM

## 2020-12-24 MED ORDER — SODIUM CHLORIDE 0.9% FLUSH
10.0000 mL | INTRAVENOUS | Status: DC | PRN
  Administered 2020-12-24: 10 mL
  Filled 2020-12-24: qty 10

## 2020-12-24 MED ORDER — HEPARIN SOD (PORK) LOCK FLUSH 100 UNIT/ML IV SOLN
INTRAVENOUS | Status: AC
  Filled 2020-12-24: qty 5

## 2020-12-24 MED ORDER — HEPARIN SOD (PORK) LOCK FLUSH 100 UNIT/ML IV SOLN
500.0000 [IU] | Freq: Once | INTRAVENOUS | Status: AC | PRN
  Administered 2020-12-24: 500 [IU]
  Filled 2020-12-24: qty 5

## 2020-12-24 MED ORDER — PEGFILGRASTIM-CBQV 6 MG/0.6ML ~~LOC~~ SOSY
6.0000 mg | PREFILLED_SYRINGE | Freq: Once | SUBCUTANEOUS | Status: AC
  Administered 2020-12-24: 6 mg via SUBCUTANEOUS
  Filled 2020-12-24: qty 0.6

## 2021-01-08 ENCOUNTER — Other Ambulatory Visit: Payer: Self-pay | Admitting: Oncology

## 2021-01-11 ENCOUNTER — Other Ambulatory Visit: Payer: Self-pay | Admitting: *Deleted

## 2021-01-11 DIAGNOSIS — C189 Malignant neoplasm of colon, unspecified: Secondary | ICD-10-CM

## 2021-01-12 ENCOUNTER — Inpatient Hospital Stay (HOSPITAL_BASED_OUTPATIENT_CLINIC_OR_DEPARTMENT_OTHER): Payer: Medicare HMO | Admitting: Oncology

## 2021-01-12 ENCOUNTER — Inpatient Hospital Stay: Payer: Medicare HMO

## 2021-01-12 VITALS — BP 122/70 | HR 77 | Temp 96.2°F | Resp 19 | Wt 143.5 lb

## 2021-01-12 DIAGNOSIS — C772 Secondary and unspecified malignant neoplasm of intra-abdominal lymph nodes: Secondary | ICD-10-CM

## 2021-01-12 DIAGNOSIS — Z5112 Encounter for antineoplastic immunotherapy: Secondary | ICD-10-CM | POA: Diagnosis not present

## 2021-01-12 DIAGNOSIS — T451X5A Adverse effect of antineoplastic and immunosuppressive drugs, initial encounter: Secondary | ICD-10-CM | POA: Diagnosis not present

## 2021-01-12 DIAGNOSIS — Z5111 Encounter for antineoplastic chemotherapy: Secondary | ICD-10-CM

## 2021-01-12 DIAGNOSIS — C189 Malignant neoplasm of colon, unspecified: Secondary | ICD-10-CM

## 2021-01-12 DIAGNOSIS — G620 Drug-induced polyneuropathy: Secondary | ICD-10-CM

## 2021-01-12 LAB — CBC WITH DIFFERENTIAL/PLATELET
Abs Immature Granulocytes: 0.05 10*3/uL (ref 0.00–0.07)
Basophils Absolute: 0.1 10*3/uL (ref 0.0–0.1)
Basophils Relative: 1 %
Eosinophils Absolute: 0.1 10*3/uL (ref 0.0–0.5)
Eosinophils Relative: 1 %
HCT: 42.2 % (ref 39.0–52.0)
Hemoglobin: 14.2 g/dL (ref 13.0–17.0)
Immature Granulocytes: 0 %
Lymphocytes Relative: 18 %
Lymphs Abs: 2 10*3/uL (ref 0.7–4.0)
MCH: 31.6 pg (ref 26.0–34.0)
MCHC: 33.6 g/dL (ref 30.0–36.0)
MCV: 94 fL (ref 80.0–100.0)
Monocytes Absolute: 1 10*3/uL (ref 0.1–1.0)
Monocytes Relative: 9 %
Neutro Abs: 7.9 10*3/uL — ABNORMAL HIGH (ref 1.7–7.7)
Neutrophils Relative %: 71 %
Platelets: 107 10*3/uL — ABNORMAL LOW (ref 150–400)
RBC: 4.49 MIL/uL (ref 4.22–5.81)
RDW: 15.7 % — ABNORMAL HIGH (ref 11.5–15.5)
WBC: 11.2 10*3/uL — ABNORMAL HIGH (ref 4.0–10.5)
nRBC: 0 % (ref 0.0–0.2)

## 2021-01-12 LAB — COMPREHENSIVE METABOLIC PANEL
ALT: 24 U/L (ref 0–44)
AST: 36 U/L (ref 15–41)
Albumin: 3.9 g/dL (ref 3.5–5.0)
Alkaline Phosphatase: 113 U/L (ref 38–126)
Anion gap: 10 (ref 5–15)
BUN: 32 mg/dL — ABNORMAL HIGH (ref 8–23)
CO2: 21 mmol/L — ABNORMAL LOW (ref 22–32)
Calcium: 8.6 mg/dL — ABNORMAL LOW (ref 8.9–10.3)
Chloride: 100 mmol/L (ref 98–111)
Creatinine, Ser: 0.94 mg/dL (ref 0.61–1.24)
GFR, Estimated: >60 mL/min (ref >60)
Glucose, Bld: 101 mg/dL — ABNORMAL HIGH (ref 70–99)
Potassium: 4.2 mmol/L (ref 3.5–5.1)
Sodium: 131 mmol/L — ABNORMAL LOW (ref 135–145)
Total Bilirubin: 0.6 mg/dL (ref 0.3–1.2)
Total Protein: 7.6 g/dL (ref 6.5–8.1)

## 2021-01-12 LAB — PROTEIN, URINE, RANDOM: Total Protein, Urine: 14 mg/dL

## 2021-01-12 MED ORDER — BEVACIZUMAB-AWWB CHEMO INJECTION 400 MG/16ML
7.5000 mg/kg | Freq: Once | INTRAVENOUS | Status: AC
  Administered 2021-01-12: 500 mg via INTRAVENOUS
  Filled 2021-01-12: qty 16

## 2021-01-12 MED ORDER — SODIUM CHLORIDE 0.9 % IV SOLN
700.0000 mg | Freq: Once | INTRAVENOUS | Status: AC
  Administered 2021-01-12: 700 mg via INTRAVENOUS
  Filled 2021-01-12: qty 10

## 2021-01-12 MED ORDER — FLUOROURACIL CHEMO INJECTION 2.5 GM/50ML
400.0000 mg/m2 | Freq: Once | INTRAVENOUS | Status: AC
  Administered 2021-01-12: 700 mg via INTRAVENOUS
  Filled 2021-01-12: qty 14

## 2021-01-12 MED ORDER — SODIUM CHLORIDE 0.9 % IV SOLN
2400.0000 mg/m2 | INTRAVENOUS | Status: DC
  Administered 2021-01-12: 4300 mg via INTRAVENOUS
  Filled 2021-01-12: qty 86

## 2021-01-12 MED ORDER — DEXAMETHASONE SODIUM PHOSPHATE 10 MG/ML IJ SOLN
10.0000 mg | Freq: Once | INTRAMUSCULAR | Status: AC
  Administered 2021-01-12: 10 mg via INTRAVENOUS
  Filled 2021-01-12: qty 1

## 2021-01-12 MED ORDER — SODIUM CHLORIDE 0.9 % IV SOLN
Freq: Once | INTRAVENOUS | Status: AC
  Filled 2021-01-12: qty 250

## 2021-01-12 NOTE — Progress Notes (Signed)
Doing good. Offers no complaints.

## 2021-01-12 NOTE — Progress Notes (Signed)
Patient tolerated infusion well. Discharged home.  

## 2021-01-12 NOTE — Progress Notes (Signed)
Hematology/Oncology Consult note [facility]  Telephone:([phone] [phone]  Patient Care Team: Jodi Marble, MD as PCP - General (Internal Medicine) Clent Jacks, RN as Oncology Nurse Navigator Sindy Guadeloupe, MD as Consulting Physician (Hematology and Oncology)   Name of the patient: Eric Simon  [id number]  [birth year]   Date of visit: 01/12/21  Diagnosis- metastatic colon cancer with lymph node metastases K-ras/BRAF wild-type  Chief complaint/ Reason for visit-on treatment assessment prior to cycle 30 of 5-FU Mvasi chemotherapy and discuss CT scan results  Heme/Onc history:  Patient is a70yrold male with >40 pack year history of smoking. He currently smokes 0.5ppd. he presented to the ER with symptoms of left sided chest pain and left arm pain. Troponin was negative ekg was unremarkable. Ct chest showed no PE. He was incidentally noted to have mediastinal and retrocrural adenopathy and a 2.1X2.3X4.4 cm retroaortic soft tissue lesion in the posterior left chest. 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 Low-grade activity in the left lateral fifth and sixth ribs associated with nondisplaced fractures likely reflecting healing response problem malignancy.  Patient underwent CT-guided biopsy of the retroperitoneal lymph node pathology showed metastatic adenocarcinoma compatible with colorectal origin. CK7 negative. CK20 positive. CDX 2+. TTF-1 negative. PSA negative. This pattern of immunoreactivity supports the above diagnosis. Patient underwent colonoscopy which showed sigmoid mass that was consistent with adenocarcinoma. RASpanel testing showed that he was wild-type for both K-ras and BRAF  FOLFOX and bevacizumab chemotherapy started in July 2020. Subsequently oxaliplatin has been on hold given  neuropathy.   Interval history-peripheral neuropathy has been stable.  He does have issues with allergies for which he is on medications.  Denies other complaints at this time  ECOG PS- 1 Pain scale- 0   Review of systems- Review of Systems  Constitutional: Negative for chills, fever, malaise/fatigue and weight loss.  HENT: Negative for congestion, ear discharge and nosebleeds.   Eyes: Negative for blurred vision.  Respiratory: Negative for cough, hemoptysis, sputum production, shortness of breath and wheezing.   Cardiovascular: Negative for chest pain, palpitations, orthopnea and claudication.  Gastrointestinal: Negative for abdominal pain, blood in stool, constipation, diarrhea, heartburn, melena, nausea and vomiting.  Genitourinary: Negative for dysuria, flank pain, frequency, hematuria and urgency.  Musculoskeletal: Negative for back pain, joint pain and myalgias.  Skin: Negative for rash.  Neurological: Negative for dizziness, tingling, focal weakness, seizures, weakness and headaches.  Endo/Heme/Allergies: Does not bruise/bleed easily.  Psychiatric/Behavioral: Negative for depression and suicidal ideas. The patient does not have insomnia.       No Known Allergies   Past Medical History:  Diagnosis [place]  . [place]   . [facility] [facility] [place] [place]   . Hyperlipidemia      Past Surgical History:  Procedure Laterality Date  . COLONOSCOPY WITH PROPOFOL N/A 06/26/2019   Procedure: COLONOSCOPY WITH PROPOFOL;  Surgeon: AJonathon Bellows MD;  [facility];  Service: Gastroenterology;  Laterality: N/A;  . PORTA CATH INSERTION N/A 06/25/2019   Procedure: PORTA CATH INSERTION;  Surgeon: DAlgernon Huxley MD;  [facility];  Service: Cardiovascular;  Laterality: N/A;  . PORTA CATH INSERTION Left 08/11/2020   Procedure: PORTA CATH INSERTION;  Surgeon: DAlgernon Huxley MD;  [place] [facility];  Service: Cardiovascular;   Laterality: Left;  . PORTA CATH REMOVAL Right 08/11/2020  Procedure: PORTA CATH REMOVAL;  Surgeon: Algernon Huxley, MD;  Location: [place] [facility];  Service: Cardiovascular;  Laterality: Right;    Social History   Socioeconomic History  . Marital status: Single    Spouse name: Not on file  . Number of children: Not on file  . Years of education: Not on file  . Highest education level: Not on file  Occupational History  . Not on file  Tobacco Use  . Smoking status: Current Every Day Smoker    Packs/day: 0.50    Years: 40.00    Pack years: 20.00    Types: Cigarettes  . Smokeless tobacco: Never Used  . Tobacco comment: smoking 40 years  Vaping Use  . Vaping Use: Never used  Substance and Sexual Activity  . Alcohol use: No  . Drug use: No  . Sexual activity: Not Currently  Other Topics Concern  . Not on file  Social History Narrative  . Not on file   Social Determinants of Health   Financial Resource Strain: Not on file  Food Insecurity: Not on file  Transportation Needs: Not on file  Physical Activity: Not on file  Stress: Not on file  Social Connections: Not on file  Intimate Partner Violence: Not on file    Family History  Problem Relation Age of Onset  . Brain cancer Father      Current Outpatient Medications:  .  aspirin EC 81 MG tablet, Take 81 mg by mouth daily., Disp: , Rfl:  .  azelastine (ASTELIN) 0.1 % nasal spray, Place 1 spray into both nostrils 1 day or 1 dose., Disp: , Rfl:  .  butalbital-acetaminophen-caffeine (FIORICET) 50-325-40 MG tablet, Take 1 tablet by mouth every 4 (four) hours as needed., Disp: , Rfl:  .  cetirizine (ZYRTEC) 10 MG tablet, Take 1 tablet (10 mg total) by mouth daily. (Patient taking differently: Take 10 mg by mouth daily as needed.), Disp: 30 tablet, Rfl: 0 .  chlorthalidone (HYGROTON) 25 MG tablet, Take 1 tablet by mouth daily., Disp: , Rfl:  .  dexamethasone (DECADRON) 4 MG tablet, Take 2 tablets (8 mg total) by mouth  daily. Start the day after chemotherapy for 2 days. Take with food., Disp: 30 tablet, Rfl: 1 .  DEXILANT 60 MG capsule, Take 1 capsule by mouth 1 day or 1 dose., Disp: , Rfl:  .  DULoxetine (CYMBALTA) 30 MG capsule, Take 1 capsule (30 mg total) by mouth 2 (two) times daily., Disp: 60 capsule, Rfl: 1 .  fluticasone (FLONASE) 50 MCG/ACT nasal spray, Place 2 sprays into both nostrils 1 day or 1 dose. , Disp: , Rfl:  .  lidocaine-prilocaine (EMLA) cream, Apply to affected area once, Disp: 30 g, Rfl: 3 .  montelukast (SINGULAIR) 10 MG tablet, Take 10 mg by mouth at bedtime. , Disp: , Rfl:  .  pantoprazole (PROTONIX) 40 MG tablet, Take 40 mg by mouth daily. , Disp: , Rfl:  .  pregabalin (LYRICA) 75 MG capsule, Take 1 capsule (75 mg total) by mouth 2 (two) times daily., Disp: 60 capsule, Rfl: 2 .  simvastatin (ZOCOR) 20 MG tablet, Take 20 mg by mouth daily. , Disp: , Rfl:  .  SYMBICORT 80-4.5 MCG/ACT inhaler, Inhale 2 puffs into the lungs 1 day or 1 dose. , Disp: , Rfl:  .  tamsulosin (FLOMAX) 0.4 MG CAPS capsule, Take 0.4 mg by mouth. , Disp: , Rfl:  .  fluticasone (VERAMYST) 27.5 MCG/SPRAY nasal spray, Place 2  sprays into the nose daily.  (Patient not taking: No sig reported), Disp: , Rfl:  .  magic mouthwash w/lidocaine SOLN, Take 5-10 mLs by mouth 4 (four) times daily as needed for mouth pain. (Patient not taking: No sig reported), Disp: 480 mL, Rfl: 3 .  nicotine (NICODERM CQ - DOSED IN MG/24 HOURS) 21 mg/24hr patch, Place 1 patch onto the skin 1 day or 1 dose.  (Patient not taking: No sig reported), Disp: , Rfl:  .  ondansetron (ZOFRAN) 8 MG tablet, Take 1 tablet (8 mg total) by mouth 2 (two) times daily as needed for refractory nausea / vomiting. Start on day 3 after chemotherapy. (Patient not taking: No sig reported), Disp: 30 tablet, Rfl: 1 .  oxyCODONE (OXY IR/ROXICODONE) 5 MG immediate release tablet, Take 1 tablet (5 mg total) by mouth every 6 (six) hours as needed for severe pain. (Patient not  taking: No sig reported), Disp: 30 tablet, Rfl: 0 .  potassium chloride SA (K-DUR) 20 MEQ tablet, Take 1 tablet (20 mEq total) by mouth daily. (Patient not taking: No sig reported), Disp: 14 tablet, Rfl: 0 .  prochlorperazine (COMPAZINE) 10 MG tablet, Take 1 tablet (10 mg total) by mouth every 6 (six) hours as needed (Nausea or vomiting). (Patient not taking: No sig reported), Disp: 30 tablet, Rfl: 1 No current facility-administered medications for this visit.  Facility-Administered Medications Ordered in Other Visits:  .  fluorouracil (ADRUCIL) 4,300 mg in sodium chloride 0.9 % 64 mL chemo infusion, 2,400 mg/m2 (Treatment Plan Recorded), Intravenous, 1 day or 1 dose, Sindy Guadeloupe, MD, 4,300 mg at 01/12/21 1144 .  sodium chloride flush (NS) 0.9 % injection 10 mL, 10 mL, Intracatheter, PRN, Sindy Guadeloupe, MD, 10 mL at 08/15/19 1405  Physical exam:  Vitals:   01/12/21 0910  BP: 122/70  Pulse: 77  Resp: 19  Temp: (!) 96.2 F (35.7 C)  TempSrc: Tympanic  SpO2: 100%  Weight: 143 lb 8 oz (65.1 kg)   Physical Exam Eyes:     Extraocular Movements: EOM normal.  Cardiovascular:     Rate and Rhythm: Normal rate and regular rhythm.     Heart sounds: Normal heart sounds.  Pulmonary:     Effort: Pulmonary effort is normal.     Breath sounds: Normal breath sounds.  Abdominal:     General: Bowel sounds are normal.     Palpations: Abdomen is soft.  Musculoskeletal:     Cervical back: Normal range of motion.  Skin:    General: Skin is warm and dry.  Neurological:     Mental Status: He is alert and oriented to person, place, and time.      CMP Latest Ref Rng & Units 01/12/2021  Glucose 70 - 99 mg/dL 101(H)  BUN 8 - 23 mg/dL 32(H)  Creatinine 0.61 - 1.24 mg/dL 0.94  Sodium 135 - 145 mmol/L 131(L)  Potassium 3.5 - 5.1 mmol/L 4.2  Chloride 98 - 111 mmol/L 100  CO2 22 - 32 mmol/L 21(L)  Calcium 8.9 - 10.3 mg/dL 8.6(L)  Total Protein 6.5 - 8.1 g/dL 7.6  Total Bilirubin 0.3 - 1.2 mg/dL  0.6  Alkaline Phos 38 - 126 U/L 113  AST 15 - 41 U/L 36  ALT 0 - 44 U/L 24   CBC Latest Ref Rng & Units 01/12/2021  WBC 4.0 - 10.5 K/uL 11.2(H)  Hemoglobin 13.0 - 17.0 g/dL 14.2  Hematocrit 39.0 - 52.0 % 42.2  Platelets 150 - 400 K/uL  107(L)    No images are attached to the encounter.  CT CHEST ABDOMEN PELVIS W CONTRAST  Result Date: 12/16/2020 CLINICAL DATA:  Colon cancer restaging, ongoing chemotherapy EXAM: CT CHEST, ABDOMEN, AND PELVIS WITH CONTRAST TECHNIQUE: Multidetector CT imaging of the chest, abdomen and pelvis was performed following the standard protocol during bolus administration of intravenous contrast. CONTRAST:  135m OMNIPAQUE IOHEXOL 300 MG/ML SOLN, additional oral enteric contrast COMPARISON:  08/27/2020 FINDINGS: CT CHEST FINDINGS Cardiovascular: Left chest port catheter. Aortic atherosclerosis. Normal heart size. Left coronary artery calcifications. No pericardial effusion. Mediastinum/Nodes: No enlarged mediastinal, hilar, or axillary lymph nodes. Thyroid gland, trachea, and esophagus demonstrate no significant findings. Lungs/Pleura: Moderate to severe centrilobular emphysema. Dependent bibasilar partial atelectasis and/or scarring. No pleural effusion or pneumothorax. Musculoskeletal: No chest wall mass or suspicious bone lesions identified. CT ABDOMEN PELVIS FINDINGS Hepatobiliary: No solid liver abnormality is seen. No gallstones, gallbladder wall thickening, or biliary dilatation. Pancreas: Unremarkable. No pancreatic ductal dilatation or surrounding inflammatory changes. Spleen: Unchanged splenomegaly, maximum span 16.6 cm. Adrenals/Urinary Tract: Adrenal glands are unremarkable. Kidneys are normal, without renal calculi, solid lesion, or hydronephrosis. Bladder is unremarkable. Stomach/Bowel: Stomach is within normal limits. Appendix appears normal. Unchanged mild circumferential wall thickening of the rectum (series 2, image 110) with fat stranding of the perirectal soft  tissues. Vascular/Lymphatic: Aortic atherosclerosis. No significant interval change in enlarged left iliac and pelvic sidewall lymph nodes, an index node measuring 1.4 x 1.1 cm (series 2, image 106). Slight interval increase in size of high left iliac lymph nodes, largest node measuring 0.9 x 0.9 cm, previously 0.6 x 0.5 cm (series 2, image 85). There is a newly enlarged, although subcentimeter left retroperitoneal lymph node measuring 0.7 x 0.7 cm (series 2, image 69). Reproductive: Mild prostatomegaly. Other: No abdominal wall hernia or abnormality. No abdominopelvic ascites. Musculoskeletal: No acute or significant osseous findings. IMPRESSION: 1. Slight interval increase in size of high left iliac lymph nodes. There is a newly enlarged, although subcentimeter left retroperitoneal lymph node. Findings are consistent with worsened nodal metastatic disease. 2. Other left iliac and pelvic sidewall lymph nodes are unchanged. 3. Unchanged mild circumferential wall thickening of the rectum with fat stranding of the perirectal soft tissues, in keeping with known primary malignancy. 4. No evidence of distant metastatic disease within the chest or organ metastatic disease in the abdomen. 5. Emphysema. 6. Coronary artery disease. Aortic Atherosclerosis (ICD10-I70.0) and Emphysema (ICD10-J43.9). Electronically Signed   By: AEddie CandleM.D.   On: 12/16/2020 09:10     Assessment and plan- Patient is a 70y.o. male with metastatic colon cancer here for on treatment assessment prior to cycle 30 of 5-FU Mvasi chemotherapy  I have reviewed CT chest abdomen and pelvis images independently and discussed findings with the patient.Patient at baseline has low-volume disease in his lymph nodes.  Overall left iliac and pelvic sidewall lymph nodes appear stable at 1.4 x 1.1 cm.  Mild interval increase in the size of high left iliac lymph nodes from 0.6 to 0.9 cm and possible new left retroperitoneal lymph node 0.7 cm.  Given the  mild progression in his disease I am not inclined to change treatment just yet I will continue giving him 5-FU Mvasi chemotherapy every 3 weeks.  If there is continued progression on the subsequent scans I will add irinotecan to his present regimen.  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 ([place])   2. Encounter for antineoplastic chemotherapy   3. Encounter for monoclonal antibody treatment for malignancy   4. Chemotherapy-induced peripheral neuropathy (HCC)      Dr. Randa Evens, MD, MPH [facility] at [facility] [phone] 01/12/2021 1:11 PM

## 2021-01-13 LAB — CEA: CEA: 3.6 ng/mL (ref 0.0–4.7)

## 2021-01-14 ENCOUNTER — Inpatient Hospital Stay: Payer: Medicare HMO | Attending: Oncology

## 2021-01-14 VITALS — BP 136/75 | HR 84 | Temp 97.9°F | Resp 16

## 2021-01-14 DIAGNOSIS — Z5111 Encounter for antineoplastic chemotherapy: Secondary | ICD-10-CM | POA: Diagnosis present

## 2021-01-14 DIAGNOSIS — C187 Malignant neoplasm of sigmoid colon: Secondary | ICD-10-CM | POA: Diagnosis present

## 2021-01-14 DIAGNOSIS — C772 Secondary and unspecified malignant neoplasm of intra-abdominal lymph nodes: Secondary | ICD-10-CM | POA: Insufficient documentation

## 2021-01-14 DIAGNOSIS — Z5189 Encounter for other specified aftercare: Secondary | ICD-10-CM | POA: Insufficient documentation

## 2021-01-14 DIAGNOSIS — Z452 Encounter for adjustment and management of vascular access device: Secondary | ICD-10-CM | POA: Diagnosis not present

## 2021-01-14 DIAGNOSIS — C189 Malignant neoplasm of colon, unspecified: Secondary | ICD-10-CM

## 2021-01-14 DIAGNOSIS — G620 Drug-induced polyneuropathy: Secondary | ICD-10-CM | POA: Insufficient documentation

## 2021-01-14 DIAGNOSIS — Z5112 Encounter for antineoplastic immunotherapy: Secondary | ICD-10-CM | POA: Insufficient documentation

## 2021-01-14 MED ORDER — HEPARIN SOD (PORK) LOCK FLUSH 100 UNIT/ML IV SOLN
500.0000 [IU] | Freq: Once | INTRAVENOUS | Status: AC | PRN
  Administered 2021-01-14: 500 [IU]
  Filled 2021-01-14: qty 5

## 2021-01-14 MED ORDER — HEPARIN SOD (PORK) LOCK FLUSH 100 UNIT/ML IV SOLN
INTRAVENOUS | Status: AC
  Filled 2021-01-14: qty 5

## 2021-01-14 MED ORDER — PEGFILGRASTIM-CBQV 6 MG/0.6ML ~~LOC~~ SOSY
6.0000 mg | PREFILLED_SYRINGE | Freq: Once | SUBCUTANEOUS | Status: AC
  Administered 2021-01-14: 6 mg via SUBCUTANEOUS
  Filled 2021-01-14: qty 0.6

## 2021-01-14 MED ORDER — SODIUM CHLORIDE 0.9% FLUSH
10.0000 mL | INTRAVENOUS | Status: DC | PRN
  Administered 2021-01-14: 10 mL
  Filled 2021-01-14: qty 10

## 2021-01-14 NOTE — Progress Notes (Signed)
Patient tolerated injection well. Discharged home.  

## 2021-02-02 ENCOUNTER — Encounter: Payer: Self-pay | Admitting: Oncology

## 2021-02-02 ENCOUNTER — Inpatient Hospital Stay (HOSPITAL_BASED_OUTPATIENT_CLINIC_OR_DEPARTMENT_OTHER): Payer: Medicare HMO | Admitting: Oncology

## 2021-02-02 ENCOUNTER — Inpatient Hospital Stay: Payer: Medicare HMO

## 2021-02-02 VITALS — BP 113/66 | HR 78 | Temp 97.8°F | Resp 18 | Wt 141.6 lb

## 2021-02-02 DIAGNOSIS — C189 Malignant neoplasm of colon, unspecified: Secondary | ICD-10-CM | POA: Diagnosis not present

## 2021-02-02 DIAGNOSIS — Z5111 Encounter for antineoplastic chemotherapy: Secondary | ICD-10-CM | POA: Diagnosis not present

## 2021-02-02 DIAGNOSIS — Z5112 Encounter for antineoplastic immunotherapy: Secondary | ICD-10-CM

## 2021-02-02 DIAGNOSIS — C772 Secondary and unspecified malignant neoplasm of intra-abdominal lymph nodes: Secondary | ICD-10-CM

## 2021-02-02 LAB — COMPREHENSIVE METABOLIC PANEL
ALT: 26 U/L (ref 0–44)
AST: 37 U/L (ref 15–41)
Albumin: 4 g/dL (ref 3.5–5.0)
Alkaline Phosphatase: 118 U/L (ref 38–126)
Anion gap: 9 (ref 5–15)
BUN: 34 mg/dL — ABNORMAL HIGH (ref 8–23)
CO2: 23 mmol/L (ref 22–32)
Calcium: 9.3 mg/dL (ref 8.9–10.3)
Chloride: 102 mmol/L (ref 98–111)
Creatinine, Ser: 0.94 mg/dL (ref 0.61–1.24)
GFR, Estimated: >60 mL/min (ref >60)
Glucose, Bld: 95 mg/dL (ref 70–99)
Potassium: 4.2 mmol/L (ref 3.5–5.1)
Sodium: 134 mmol/L — ABNORMAL LOW (ref 135–145)
Total Bilirubin: 0.8 mg/dL (ref 0.3–1.2)
Total Protein: 7.7 g/dL (ref 6.5–8.1)

## 2021-02-02 LAB — CBC WITH DIFFERENTIAL/PLATELET
Abs Immature Granulocytes: 0.05 10*3/uL (ref 0.00–0.07)
Basophils Absolute: 0.1 10*3/uL (ref 0.0–0.1)
Basophils Relative: 1 %
Eosinophils Absolute: 0.2 10*3/uL (ref 0.0–0.5)
Eosinophils Relative: 1 %
HCT: 42.6 % (ref 39.0–52.0)
Hemoglobin: 14.1 g/dL (ref 13.0–17.0)
Immature Granulocytes: 0 %
Lymphocytes Relative: 18 %
Lymphs Abs: 2.3 10*3/uL (ref 0.7–4.0)
MCH: 31.1 pg (ref 26.0–34.0)
MCHC: 33.1 g/dL (ref 30.0–36.0)
MCV: 94 fL (ref 80.0–100.0)
Monocytes Absolute: 0.9 10*3/uL (ref 0.1–1.0)
Monocytes Relative: 7 %
Neutro Abs: 9.1 10*3/uL — ABNORMAL HIGH (ref 1.7–7.7)
Neutrophils Relative %: 73 %
Platelets: 132 10*3/uL — ABNORMAL LOW (ref 150–400)
RBC: 4.53 MIL/uL (ref 4.22–5.81)
RDW: 15.9 % — ABNORMAL HIGH (ref 11.5–15.5)
WBC: 12.6 10*3/uL — ABNORMAL HIGH (ref 4.0–10.5)
nRBC: 0 % (ref 0.0–0.2)

## 2021-02-02 LAB — PROTEIN, URINE, RANDOM: Total Protein, Urine: 11 mg/dL

## 2021-02-02 MED ORDER — HEPARIN SOD (PORK) LOCK FLUSH 100 UNIT/ML IV SOLN
500.0000 [IU] | Freq: Once | INTRAVENOUS | Status: DC
  Filled 2021-02-02: qty 5

## 2021-02-02 MED ORDER — SODIUM CHLORIDE 0.9 % IV SOLN
2400.0000 mg/m2 | INTRAVENOUS | Status: DC
  Administered 2021-02-02: 4300 mg via INTRAVENOUS
  Filled 2021-02-02: qty 86

## 2021-02-02 MED ORDER — FLUOROURACIL CHEMO INJECTION 2.5 GM/50ML
400.0000 mg/m2 | Freq: Once | INTRAVENOUS | Status: AC
  Administered 2021-02-02: 700 mg via INTRAVENOUS
  Filled 2021-02-02: qty 14

## 2021-02-02 MED ORDER — SODIUM CHLORIDE 0.9 % IV SOLN
700.0000 mg | Freq: Once | INTRAVENOUS | Status: AC
  Administered 2021-02-02: 700 mg via INTRAVENOUS
  Filled 2021-02-02: qty 25

## 2021-02-02 MED ORDER — SODIUM CHLORIDE 0.9 % IV SOLN
7.5000 mg/kg | Freq: Once | INTRAVENOUS | Status: AC
  Administered 2021-02-02: 500 mg via INTRAVENOUS
  Filled 2021-02-02: qty 16

## 2021-02-02 MED ORDER — DEXAMETHASONE SODIUM PHOSPHATE 10 MG/ML IJ SOLN
10.0000 mg | Freq: Once | INTRAMUSCULAR | Status: AC
  Administered 2021-02-02: 10 mg via INTRAVENOUS
  Filled 2021-02-02: qty 1

## 2021-02-02 MED ORDER — SODIUM CHLORIDE 0.9% FLUSH
10.0000 mL | INTRAVENOUS | Status: DC | PRN
  Administered 2021-02-02: 10 mL via INTRAVENOUS
  Filled 2021-02-02: qty 10

## 2021-02-02 MED ORDER — SODIUM CHLORIDE 0.9 % IV SOLN
INTRAVENOUS | Status: DC
  Filled 2021-02-02 (×2): qty 250

## 2021-02-02 NOTE — Progress Notes (Signed)
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 He currently smokes 0.5ppd. he presented to the ER with symptoms of left sided chest pain and left arm pain. Troponin was negative ekg was unremarkable. Ct chest showed no PE. He was incidentally noted to have mediastinal and retrocrural adenopathy and a 2.1X2.3X4.4 cm retroaortic soft tissue lesion in the posterior left chest. 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 Low-grade activity in the left lateral fifth and sixth ribs associated with nondisplaced fractures likely reflecting healing response problem malignancy.  Patient underwent CT-guided biopsy of the retroperitoneal lymph node pathology showed metastatic adenocarcinoma compatible with colorectal origin. CK7 negative. CK20 positive. CDX 2+. TTF-1 negative. PSA negative. This pattern of immunoreactivity supports the above diagnosis. Patient underwent colonoscopy which showed sigmoid mass that was consistent with adenocarcinoma. RASpanel testing showed that he was wild-type for both K-ras and BRAF  FOLFOX and bevacizumab chemotherapy started in July 2020. Subsequently oxaliplatin has been on hold given neuropathy.   Interval  history-patient reports feeling well and denies any complaints at this time.  Neuropathy has remained stable.  Appetite and weight is also stable. He was seen by cardiology recently and states that he was put on some heart medicine and also went through stress test.  I cannot see those results in our system   ECOG PS- 1 Pain scale- 0   Review of systems- Review of Systems  Constitutional: Negative for chills, fever, malaise/fatigue and weight loss.  HENT: Negative for congestion, ear discharge and nosebleeds.   Eyes: Negative for blurred vision.  Respiratory: Negative for cough, hemoptysis, sputum production, shortness of breath and wheezing.   Cardiovascular: Negative for chest pain, palpitations, orthopnea and claudication.  Gastrointestinal: Negative for abdominal pain, blood in stool, constipation, diarrhea, heartburn, melena, nausea and vomiting.  Genitourinary: Negative for dysuria, flank pain, frequency, hematuria and urgency.  Musculoskeletal: Negative for back pain, joint pain and myalgias.  Skin: Negative for rash.  Neurological: Negative for dizziness, tingling, focal weakness, seizures, weakness and headaches.  Endo/Heme/Allergies: Does not bruise/bleed easily.  Psychiatric/Behavioral: Negative for depression and suicidal ideas. The patient does not have insomnia.       No Known Allergies   Past Medical History:  Diagnosis Date  . [place]   . COPD [place]ch[place] obstructive [place] disease) [place]   . Hyperlipidemia      Past Surgical History:  Procedure Laterality Date  . COLONOSCOPY WITH PROPOFOL N/A 06/26/2019   Procedure: COLONOSCOPY WITH PROPOFOL;  Surgeon: AJonathon Bellows MD;  [facility];  Service: Gastroenterology;  Laterality: N/A;  . PORTA CATH INSERTION N/A 06/25/2019   Procedure: PORTA CATH INSERTION;  Surgeon: DAlgernon Huxley MD;  [facility];  Service: Cardiovascular;  Laterality: N/A;  . PORTA CATH INSERTION Left  08/11/2020   Procedure: PORTA CATH INSERTION;  Surgeon: Algernon Huxley, MD;  Location: [place] [facility];  Service: Cardiovascular;  Laterality: Left;  . PORTA CATH REMOVAL Right 08/11/2020   Procedure: PORTA CATH REMOVAL;  Surgeon: Algernon Huxley, MD;  Location: [place] [facility];  Service: Cardiovascular;  Laterality: Right;    Social History   Socioeconomic History  . Marital status: Single    Spouse name: Not on file  . Number of children: Not on file  . Years of education: Not on file  . Highest education level: Not on file  Occupational History  . Not on file  Tobacco Use  . Smoking status: Current Every Day Smoker    Packs/day: 0.50    Years: 40.00    Pack years: 20.00    Types: Cigarettes  . Smokeless tobacco: Never Used  . Tobacco comment: smoking 40 years  Vaping Use  . Vaping Use: Never used  Substance and Sexual Activity  . Alcohol use: No  . Drug use: No  . Sexual activity: Not Currently  Other Topics Concern  . Not on file  Social History Narrative  . Not on file   Social Determinants of Health   Financial Resource Strain: Not on file  Food Insecurity: Not on file  Transportation Needs: Not on file  Physical Activity: Not on file  Stress: Not on file  Social Connections: Not on file  Intimate Partner Violence: Not on file    Family History  Problem Relation Age of Onset  . Brain cancer Father      Current Outpatient Medications:  .  aspirin EC 81 MG tablet, Take 81 mg by mouth daily., Disp: , Rfl:  .  azelastine (ASTELIN) 0.1 % nasal spray, Place 1 spray into both nostrils 1 day or 1 dose., Disp: , Rfl:  .  butalbital-acetaminophen-caffeine (FIORICET) 50-325-40 MG tablet, Take 1 tablet by mouth every 4 (four) hours as needed., Disp: , Rfl:  .  chlorthalidone (HYGROTON) 25 MG tablet, Take 1 tablet by mouth daily., Disp: , Rfl:  .  dexamethasone (DECADRON) 4 MG tablet, Take 2 tablets (8 mg total) by mouth daily. Start the day after chemotherapy for  2 days. Take with food., Disp: 30 tablet, Rfl: 1 .  DEXILANT 60 MG capsule, Take 1 capsule by mouth 1 day or 1 dose., Disp: , Rfl:  .  DULoxetine (CYMBALTA) 30 MG capsule, Take 1 capsule (30 mg total) by mouth 2 (two) times daily., Disp: 60 capsule, Rfl: 1 .  fluticasone (FLONASE) 50 MCG/ACT nasal spray, Place 2 sprays into both nostrils 1 day or 1 dose. , Disp: , Rfl:  .  lidocaine-prilocaine (EMLA) cream, Apply to affected area once, Disp: 30 g, Rfl: 3 .  montelukast (SINGULAIR) 10 MG tablet, Take 10 mg by mouth at bedtime. , Disp: , Rfl:  .  pantoprazole (PROTONIX) 40 MG tablet, Take 40 mg by mouth daily. , Disp: , Rfl:  .  pregabalin (LYRICA) 75 MG capsule, Take 1 capsule (75 mg total) by mouth 2 (two) times daily., Disp: 60 capsule, Rfl: 2 .  simvastatin (ZOCOR) 20 MG tablet, Take 20 mg by mouth daily. , Disp: , Rfl:  .  SYMBICORT 80-4.5 MCG/ACT inhaler, Inhale 2 puffs into the lungs 1 day or 1 dose. , Disp: , Rfl:  .  tamsulosin (FLOMAX) 0.4 MG CAPS capsule, Take 0.4 mg by mouth. , Disp: , Rfl:  .  cetirizine (ZYRTEC) 10 MG tablet, Take 1 tablet (10 mg total) by mouth daily. (  Patient not taking: Reported on 02/02/2021), Disp: 30 tablet, Rfl: 0 .  fluticasone (VERAMYST) 27.5 MCG/SPRAY nasal spray, Place 2 sprays into the nose daily.  (Patient not taking: No sig reported), Disp: , Rfl:  .  magic mouthwash w/lidocaine SOLN, Take 5-10 mLs by mouth 4 (four) times daily as needed for mouth pain. (Patient not taking: No sig reported), Disp: 480 mL, Rfl: 3 .  nicotine (NICODERM CQ - DOSED IN MG/24 HOURS) 21 mg/24hr patch, Place 1 patch onto the skin 1 day or 1 dose.  (Patient not taking: No sig reported), Disp: , Rfl:  .  ondansetron (ZOFRAN) 8 MG tablet, Take 1 tablet (8 mg total) by mouth 2 (two) times daily as needed for refractory nausea / vomiting. Start on day 3 after chemotherapy. (Patient not taking: No sig reported), Disp: 30 tablet, Rfl: 1 .  oxyCODONE (OXY IR/ROXICODONE) 5 MG immediate  release tablet, Take 1 tablet (5 mg total) by mouth every 6 (six) hours as needed for severe pain. (Patient not taking: No sig reported), Disp: 30 tablet, Rfl: 0 .  potassium chloride SA (K-DUR) 20 MEQ tablet, Take 1 tablet (20 mEq total) by mouth daily. (Patient not taking: No sig reported), Disp: 14 tablet, Rfl: 0 .  prochlorperazine (COMPAZINE) 10 MG tablet, Take 1 tablet (10 mg total) by mouth every 6 (six) hours as needed (Nausea or vomiting). (Patient not taking: No sig reported), Disp: 30 tablet, Rfl: 1 No current facility-administered medications for this visit.  Facility-Administered Medications Ordered in Other Visits:  .  0.9 %  sodium chloride infusion, , Intravenous, Continuous, Sindy Guadeloupe, MD, Stopped at 02/02/21 1126 .  fluorouracil (ADRUCIL) 4,300 mg in sodium chloride 0.9 % 64 mL chemo infusion, 2,400 mg/m2 (Treatment Plan Recorded), Intravenous, 1 day or 1 dose, Sindy Guadeloupe, MD .  fluorouracil (ADRUCIL) chemo injection 700 mg, 400 mg/m2 (Treatment Plan Recorded), Intravenous, Once, Sindy Guadeloupe, MD .  heparin lock flush 100 unit/mL, 500 Units, Intravenous, Once, Sindy Guadeloupe, MD .  sodium chloride flush (NS) 0.9 % injection 10 mL, 10 mL, Intracatheter, PRN, Sindy Guadeloupe, MD, 10 mL at 08/15/19 1405 .  sodium chloride flush (NS) 0.9 % injection 10 mL, 10 mL, Intravenous, PRN, Sindy Guadeloupe, MD, 10 mL at 02/02/21 0835  Physical exam:  Vitals:   02/02/21 0849  BP: 113/66  Pulse: 78  Resp: 18  Temp: 97.8 F (36.6 C)  TempSrc: Tympanic  SpO2: 100%  Weight: 141 lb 9.6 oz (64.2 kg)   Physical Exam Constitutional:      General: He is not in acute distress. Eyes:     Extraocular Movements: EOM normal.  Cardiovascular:     Rate and Rhythm: Normal rate and regular rhythm.     Heart sounds: Normal heart sounds.  Pulmonary:     Effort: Pulmonary effort is normal.     Breath sounds: Normal breath sounds.  Abdominal:     General: Bowel sounds are normal.      Palpations: Abdomen is soft.  Skin:    General: Skin is warm and dry.  Neurological:     Mental Status: He is alert and oriented to person, place, and time.      CMP Latest Ref Rng & Units 02/02/2021  Glucose 70 - 99 mg/dL 95  BUN 8 - 23 mg/dL 34(H)  Creatinine 0.61 - 1.24 mg/dL 0.94  Sodium 135 - 145 mmol/L 134(L)  Potassium 3.5 - 5.1 mmol/L 4.2  Chloride  98 - 111 mmol/L 102  CO2 22 - 32 mmol/L 23  Calcium 8.9 - 10.3 mg/dL 9.3  Total Protein 6.5 - 8.1 g/dL 7.7  Total Bilirubin 0.3 - 1.2 mg/dL 0.8  Alkaline Phos 38 - 126 U/L 118  AST 15 - 41 U/L 37  ALT 0 - 44 U/L 26   CBC Latest Ref Rng & Units 02/02/2021  WBC 4.0 - 10.5 K/uL 12.6(H)  Hemoglobin 13.0 - 17.0 g/dL 14.1  Hematocrit 39.0 - 52.0 % 42.6  Platelets 150 - 400 K/uL 132(L)      Assessment and plan- Patient is a 70 y.o. male with metastatic colon cancer here for on treatment assessment prior to cycle 21 of 5-FU Mvasi chemotherapy  Counts okay to proceed with cycle 31 of 5-FU Mvasi chemotherapy today.  Pump DC on day 3 and gets Udenyca.  I will see him back in 3 weeks for cycle 32.  Blood pressure is overall stable and urine protein Trace and acceptable to proceed with Mvasi.  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  Continue to monitor   Visit Diagnosis 1. Encounter for antineoplastic chemotherapy   2. Encounter for monoclonal antibody treatment for malignancy   3. 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 ([place])      Dr. Randa Evens, MD, MPH [facility] at [facility] [phone] 02/02/2021 11:28 AM

## 2021-02-03 ENCOUNTER — Ambulatory Visit: Payer: Medicare HMO | Admitting: Oncology

## 2021-02-03 ENCOUNTER — Ambulatory Visit: Payer: Medicare HMO

## 2021-02-03 LAB — CEA: CEA: 4.7 ng/mL (ref 0.0–4.7)

## 2021-02-04 ENCOUNTER — Other Ambulatory Visit: Payer: Self-pay

## 2021-02-04 ENCOUNTER — Inpatient Hospital Stay: Payer: Medicare HMO

## 2021-02-04 DIAGNOSIS — C189 Malignant neoplasm of colon, unspecified: Secondary | ICD-10-CM

## 2021-02-04 DIAGNOSIS — Z5112 Encounter for antineoplastic immunotherapy: Secondary | ICD-10-CM | POA: Diagnosis not present

## 2021-02-04 MED ORDER — HEPARIN SOD (PORK) LOCK FLUSH 100 UNIT/ML IV SOLN
500.0000 [IU] | Freq: Once | INTRAVENOUS | Status: AC | PRN
  Administered 2021-02-04: 500 [IU]
  Filled 2021-02-04: qty 5

## 2021-02-04 MED ORDER — SODIUM CHLORIDE 0.9% FLUSH
10.0000 mL | INTRAVENOUS | Status: DC | PRN
  Administered 2021-02-04: 10 mL
  Filled 2021-02-04: qty 10

## 2021-02-04 MED ORDER — PEGFILGRASTIM-CBQV 6 MG/0.6ML ~~LOC~~ SOSY
6.0000 mg | PREFILLED_SYRINGE | Freq: Once | SUBCUTANEOUS | Status: AC
  Administered 2021-02-04: 6 mg via SUBCUTANEOUS
  Filled 2021-02-04: qty 0.6

## 2021-02-04 MED ORDER — HEPARIN SOD (PORK) LOCK FLUSH 100 UNIT/ML IV SOLN
INTRAVENOUS | Status: AC
  Filled 2021-02-04: qty 5

## 2021-02-09 ENCOUNTER — Inpatient Hospital Stay (HOSPITAL_BASED_OUTPATIENT_CLINIC_OR_DEPARTMENT_OTHER): Payer: Medicare HMO | Admitting: Hospice and Palliative Medicine

## 2021-02-09 DIAGNOSIS — C772 Secondary and unspecified malignant neoplasm of intra-abdominal lymph nodes: Secondary | ICD-10-CM | POA: Diagnosis not present

## 2021-02-09 DIAGNOSIS — Z515 Encounter for palliative care: Secondary | ICD-10-CM

## 2021-02-09 DIAGNOSIS — C189 Malignant neoplasm of colon, unspecified: Secondary | ICD-10-CM

## 2021-02-09 NOTE — Progress Notes (Signed)
Virtual Visit via Telephone Note  I connected with Eric Simon on 02/09/21 at 11:30 AM EST by telephone and verified that I am speaking with the correct person using two identifiers.  Location: Patient: home Provider: clinic   I discussed the limitations, risks, security and privacy concerns of performing an evaluation and management service by telephone and the availability of in person appointments. I also discussed with the patient that there may be a patient responsible charge related to this service. The patient expressed understanding and agreed to proceed.   History of Present Illness: Eric Simon is a 70 y.o. 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 Patient is on 5-FU Mvasi chemotherapy. He was referred to palliative care to discuss goals and manage ongoing symptoms..    Observations/Objective: I called and spoke with patient by phone. He reports that overall he feels he is doing well. His only symptomatic complaint is constipation causing painful hemorrhoids. He is scheduled to see Dr. [name] with GI tomorrow for management. Currently, patient says he is only taking Colace. He says he is tried [place] and it did not work. Would likely benefit from addition of a stimulant laxative. However, will defer recommendations to GI.  Patient denies other symptomatic complaints. He reports good oral intake. Seems to be tolerating chemotherapy well. No issues with medications nor need for refills today.  Assessment and Plan: Stage IV colorectal cancer -on systemic treatment and followed by Dr. [name].  Patient appears to be doing reasonably well without severe symptomatic burden at present.  Will follow.   Follow Up Instructions: Follow-up telephone visit about 2 months   I discussed the assessment and treatment plan with the  patient. The patient was provided an opportunity to ask questions and all were answered. The patient agreed with the plan and demonstrated an understanding of the instructions.   The patient was advised to call back or seek an in-person evaluation if the symptoms worsen or if the condition fails to improve as anticipated.  I provided 5 minutes of non-face-to-face time during this encounter.   Irean Hong, NP

## 2021-02-10 ENCOUNTER — Other Ambulatory Visit: Payer: Self-pay

## 2021-02-10 ENCOUNTER — Telehealth: Payer: Self-pay | Admitting: Gastroenterology

## 2021-02-10 ENCOUNTER — Ambulatory Visit (INDEPENDENT_AMBULATORY_CARE_PROVIDER_SITE_OTHER): Payer: Medicare HMO | Admitting: Gastroenterology

## 2021-02-10 VITALS — BP 120/76 | HR 80 | Temp 98.1°F | Ht 67.0 in | Wt 143.0 lb

## 2021-02-10 DIAGNOSIS — K5900 Constipation, unspecified: Secondary | ICD-10-CM

## 2021-02-10 DIAGNOSIS — K602 Anal fissure, unspecified: Secondary | ICD-10-CM | POA: Diagnosis not present

## 2021-02-10 MED ORDER — LACTULOSE 20 G PO PACK: 20.0000 g | PACK | Freq: Four times a day (QID) | ORAL | 1 refills | Status: DC

## 2021-02-10 NOTE — Progress Notes (Signed)
[name] MD, MRCP(U.K) [address]  [place]  [place], [place] [address]  Main: [phone]  Fax: [phone]   Primary Care Physician: Jodi Marble, MD  Primary Gastroenterologist:  Dr. [name]   He is here today to see me for hemorrhoids.  HPI: Eric Simon is a 70 y.o. male   Summary of history :  He has a history of stage IV colorectal cancer metastatic to retroperitoneal pelvic and thoracic lymph nodes.  He is on chemotherapy.  He reported that he had painful hemorrhoids with palliative care yesterday.  History of constipation.  CT scan in January 2022 demonstrated slight interval increase in size of left iliac lymph nodes unchanged mild circumferential wall thickening of the rectum with fat stranding of the perirectal soft tissues in keeping with known primary malignancy. His colonoscopy was performed in July 2020 and a sessile nonobstructing masslike lesion was found in the distal sigmoid colon 20 cm proximal to the anus partially circumferential 2 cm in length.  Possible small internal hemorrhoids noted on retroflexion.  02/02/2021: Hemoglobin 14.1 g  He states that he has had issues with painful defecation for a while.  He has suffered from chronic constipation.  Tried MiraLAX.  Not had a good result.  He has a bowel movement every few days.  Not on any narcotics.  No bleeding.  Current Outpatient Medications  Medication Sig Dispense Refill  . aspirin EC 81 MG tablet Take 81 mg by mouth daily.    [name] azelastine (ASTELIN) 0.1 % nasal spray Place 1 spray into both nostrils 1 day or 1 dose.    . butalbital-acetaminophen-caffeine (FIORICET) 50-325-40 MG tablet Take 1 tablet by mouth every 4 (four) hours as needed.    . cetirizine (ZYRTEC) 10 MG tablet Take 1 tablet (10 mg total) by mouth daily. (Patient not taking: Reported on 02/02/2021) 30 tablet 0  . chlorthalidone (HYGROTON) 25 MG tablet Take 1 tablet by mouth daily.    [name] dexamethasone (DECADRON) 4 MG  tablet Take 2 tablets (8 mg total) by mouth daily. Start the day after chemotherapy for 2 days. Take with food. 30 tablet 1  . DEXILANT 60 MG capsule Take 1 capsule by mouth 1 day or 1 dose.    . DULoxetine (CYMBALTA) 30 MG capsule Take 1 capsule (30 mg total) by mouth 2 (two) times daily. 60 capsule 1  . fluticasone (FLONASE) 50 MCG/ACT nasal spray Place 2 sprays into both nostrils 1 day or 1 dose.     . fluticasone (VERAMYST) 27.5 MCG/SPRAY nasal spray Place 2 sprays into the nose daily.  (Patient not taking: No sig reported)    . lidocaine-prilocaine (EMLA) cream Apply to affected area once 30 g 3  . magic mouthwash w/lidocaine SOLN Take 5-10 mLs by mouth 4 (four) times daily as needed for mouth pain. (Patient not taking: No sig reported) 480 mL 3  . montelukast (SINGULAIR) 10 MG tablet Take 10 mg by mouth at bedtime.     . nicotine (NICODERM CQ - DOSED IN MG/24 HOURS) 21 mg/24hr patch Place 1 patch onto the skin 1 day or 1 dose.  (Patient not taking: No sig reported)    . ondansetron (ZOFRAN) 8 MG tablet Take 1 tablet (8 mg total) by mouth 2 (two) times daily as needed for refractory nausea / vomiting. Start on day 3 after chemotherapy. (Patient not taking: No sig reported) 30 tablet 1  . oxyCODONE (OXY IR/ROXICODONE) 5 MG immediate release tablet  Take 1 tablet (5 mg total) by mouth every 6 (six) hours as needed for severe pain. (Patient not taking: No sig reported) 30 tablet 0  . pantoprazole (PROTONIX) 40 MG tablet Take 40 mg by mouth daily.     . potassium chloride SA (K-DUR) 20 MEQ tablet Take 1 tablet (20 mEq total) by mouth daily. (Patient not taking: No sig reported) 14 tablet 0  . pregabalin (LYRICA) 75 MG capsule Take 1 capsule (75 mg total) by mouth 2 (two) times daily. 60 capsule 2  . prochlorperazine (COMPAZINE) 10 MG tablet Take 1 tablet (10 mg total) by mouth every 6 (six) hours as needed (Nausea or vomiting). (Patient not taking: No sig reported) 30 tablet 1  . simvastatin (ZOCOR)  20 MG tablet Take 20 mg by mouth daily.     . SYMBICORT 80-4.5 MCG/ACT inhaler Inhale 2 puffs into the lungs 1 day or 1 dose.     . tamsulosin (FLOMAX) 0.4 MG CAPS capsule Take 0.4 mg by mouth.      No current facility-administered medications for this visit.   Facility-Administered Medications Ordered in Other Visits  Medication Dose Route Frequency Provider Last Rate Last Admin  . sodium chloride flush (NS) 0.9 % injection 10 mL  10 mL Intracatheter PRN Sindy Guadeloupe, MD   10 mL at 08/15/19 1405    Allergies as of 02/10/2021  . (No Known Allergies)    ROS:  General: Negative for anorexia, weight loss, fever, chills, fatigue, weakness. ENT: Negative for hoarseness, difficulty swallowing , nasal congestion. CV: Negative for chest pain, angina, palpitations, dyspnea on exertion, peripheral edema.  Respiratory: Negative for dyspnea at rest, dyspnea on exertion, cough, sputum, wheezing.  GI: See history of present illness. GU:  Negative for dysuria, hematuria, urinary incontinence, urinary frequency, nocturnal urination.  Endo: Negative for unusual weight change.    Physical Examination:   BP 120/76   Pulse 80   Temp 98.1 F (36.7 C)   Ht 5\' 7"  (1.702 m)   Wt 143 lb (64.9 kg)   BMI 22.40 kg/m   General: Well-nourished, well-developed in no acute distress.  Eyes: No icterus. Conjunctivae pink. Rectal exam: Chaperone present in the room: Externally no abnormalities noted.  On insertion of the finger there was significant tenderness but no swelling in the posterior aspect of the anus replicating the pain that he has usually.  No masses palpable internally.   Imaging Studies: No results found.  Assessment and Plan:   Eric Simon is a 70 y.o. y/o male with a history of stage IV metastatic colorectal cancer diagnosed in 2020 on chemotherapy here to see me for painful defecation.  His history, symptoms and physical examination are consistent with a posterior anal fissure.   This is likely secondary to constipation.  Plan 1.  Topical nitroglycerin topical instructions provided 2.  Commence on lactulose 20 cc 3 times daily.  I would like to avoid a stimulant laxative at this point of time since I am not sure if there is any luminal obstruction from the cancer and if there is this medication would be contraindicated.  If lactulose does not work then will need a flexible sigmoidoscopy to rule out any luminal obstruction followed by commencement on Linzess or Amitiza. 3.  Telephone or video visit in 7 to 10 days    Dr [name]  MD,MRCP [facility]) Follow up in 7 to 10 days

## 2021-02-10 NOTE — Telephone Encounter (Signed)
[place] calling for [employer] are not covered by insurance. Need new medication  Covered is: Lactolose liquid or Constulose liquid

## 2021-02-11 ENCOUNTER — Other Ambulatory Visit: Payer: Self-pay

## 2021-02-11 MED ORDER — LACTULOSE 10 GM/15ML PO SOLN: 20.0000 g | Freq: Three times a day (TID) | ORAL | 1 refills | Status: AC

## 2021-02-11 NOTE — Progress Notes (Signed)
Tablets were not covered by insurance. Sent in liquid which is covered by patients insurance.

## 2021-02-22 ENCOUNTER — Other Ambulatory Visit: Payer: Self-pay | Admitting: *Deleted

## 2021-02-22 DIAGNOSIS — C189 Malignant neoplasm of colon, unspecified: Secondary | ICD-10-CM

## 2021-02-22 DIAGNOSIS — C772 Secondary and unspecified malignant neoplasm of intra-abdominal lymph nodes: Secondary | ICD-10-CM

## 2021-02-23 ENCOUNTER — Encounter: Payer: Self-pay | Admitting: Oncology

## 2021-02-23 ENCOUNTER — Inpatient Hospital Stay (HOSPITAL_BASED_OUTPATIENT_CLINIC_OR_DEPARTMENT_OTHER): Payer: Medicare HMO | Admitting: Oncology

## 2021-02-23 ENCOUNTER — Inpatient Hospital Stay: Payer: Medicare HMO

## 2021-02-23 ENCOUNTER — Inpatient Hospital Stay: Payer: Medicare HMO | Attending: Oncology

## 2021-02-23 ENCOUNTER — Other Ambulatory Visit: Payer: Self-pay

## 2021-02-23 VITALS — BP 138/69 | HR 75 | Temp 98.1°F | Resp 16 | Wt 145.1 lb

## 2021-02-23 DIAGNOSIS — C189 Malignant neoplasm of colon, unspecified: Secondary | ICD-10-CM

## 2021-02-23 DIAGNOSIS — Z5111 Encounter for antineoplastic chemotherapy: Secondary | ICD-10-CM

## 2021-02-23 DIAGNOSIS — C772 Secondary and unspecified malignant neoplasm of intra-abdominal lymph nodes: Secondary | ICD-10-CM | POA: Diagnosis not present

## 2021-02-23 DIAGNOSIS — Z452 Encounter for adjustment and management of vascular access device: Secondary | ICD-10-CM | POA: Diagnosis not present

## 2021-02-23 DIAGNOSIS — C187 Malignant neoplasm of sigmoid colon: Secondary | ICD-10-CM | POA: Insufficient documentation

## 2021-02-23 DIAGNOSIS — Z5189 Encounter for other specified aftercare: Secondary | ICD-10-CM | POA: Diagnosis not present

## 2021-02-23 DIAGNOSIS — Z5112 Encounter for antineoplastic immunotherapy: Secondary | ICD-10-CM

## 2021-02-23 LAB — COMPREHENSIVE METABOLIC PANEL WITH GFR
ALT: 24 U/L (ref 0–44)
AST: 39 U/L (ref 15–41)
Albumin: 3.8 g/dL (ref 3.5–5.0)
Alkaline Phosphatase: 114 U/L (ref 38–126)
Anion gap: 6 (ref 5–15)
BUN: 23 mg/dL (ref 8–23)
CO2: 25 mmol/L (ref 22–32)
Calcium: 8.9 mg/dL (ref 8.9–10.3)
Chloride: 103 mmol/L (ref 98–111)
Creatinine, Ser: 0.88 mg/dL (ref 0.61–1.24)
GFR, Estimated: >60 mL/min
Glucose, Bld: 98 mg/dL (ref 70–99)
Potassium: 3.9 mmol/L (ref 3.5–5.1)
Sodium: 134 mmol/L — ABNORMAL LOW (ref 135–145)
Total Bilirubin: 0.8 mg/dL (ref 0.3–1.2)
Total Protein: 7.4 g/dL (ref 6.5–8.1)

## 2021-02-23 LAB — CBC WITH DIFFERENTIAL/PLATELET
Abs Immature Granulocytes: 0.05 10*3/uL (ref 0.00–0.07)
Basophils Absolute: 0.1 10*3/uL (ref 0.0–0.1)
Basophils Relative: 1 %
Eosinophils Absolute: 0.1 10*3/uL (ref 0.0–0.5)
Eosinophils Relative: 1 %
HCT: 39.3 % (ref 39.0–52.0)
Hemoglobin: 13.4 g/dL (ref 13.0–17.0)
Immature Granulocytes: 1 %
Lymphocytes Relative: 17 %
Lymphs Abs: 1.7 10*3/uL (ref 0.7–4.0)
MCH: 31.9 pg (ref 26.0–34.0)
MCHC: 34.1 g/dL (ref 30.0–36.0)
MCV: 93.6 fL (ref 80.0–100.0)
Monocytes Absolute: 0.7 10*3/uL (ref 0.1–1.0)
Monocytes Relative: 7 %
Neutro Abs: 7.5 10*3/uL (ref 1.7–7.7)
Neutrophils Relative %: 73 %
Platelets: 117 10*3/uL — ABNORMAL LOW (ref 150–400)
RBC: 4.2 MIL/uL — ABNORMAL LOW (ref 4.22–5.81)
RDW: 16.3 % — ABNORMAL HIGH (ref 11.5–15.5)
WBC: 10.1 10*3/uL (ref 4.0–10.5)
nRBC: 0 % (ref 0.0–0.2)

## 2021-02-23 LAB — PROTEIN, URINE, RANDOM: Total Protein, Urine: 15 mg/dL

## 2021-02-23 MED ORDER — DEXAMETHASONE SODIUM PHOSPHATE 10 MG/ML IJ SOLN
10.0000 mg | Freq: Once | INTRAMUSCULAR | Status: AC
  Administered 2021-02-23: 10 mg via INTRAVENOUS
  Filled 2021-02-23: qty 1

## 2021-02-23 MED ORDER — FLUOROURACIL CHEMO INJECTION 2.5 GM/50ML
400.0000 mg/m2 | Freq: Once | INTRAVENOUS | Status: AC
  Administered 2021-02-23: 700 mg via INTRAVENOUS
  Filled 2021-02-23: qty 14

## 2021-02-23 MED ORDER — SODIUM CHLORIDE 0.9 % IV SOLN
7.5000 mg/kg | Freq: Once | INTRAVENOUS | Status: AC
  Administered 2021-02-23: 500 mg via INTRAVENOUS
  Filled 2021-02-23: qty 16

## 2021-02-23 MED ORDER — SODIUM CHLORIDE 0.9 % IV SOLN
INTRAVENOUS | Status: DC
  Filled 2021-02-23: qty 250

## 2021-02-23 MED ORDER — SODIUM CHLORIDE 0.9 % IV SOLN
2400.0000 mg/m2 | INTRAVENOUS | Status: DC
  Administered 2021-02-23: 4300 mg via INTRAVENOUS
  Filled 2021-02-23: qty 86

## 2021-02-23 MED ORDER — SODIUM CHLORIDE 0.9 % IV SOLN
700.0000 mg | Freq: Once | INTRAVENOUS | Status: AC
  Administered 2021-02-23: 700 mg via INTRAVENOUS
  Filled 2021-02-23: qty 25

## 2021-02-23 MED ORDER — LIDOCAINE-PRILOCAINE 2.5-2.5 % EX CREA: TOPICAL_CREAM | CUTANEOUS | 3 refills | Status: DC

## 2021-02-23 NOTE — Progress Notes (Signed)
Hematology/Oncology Consult note [facility]  Telephone:([phone] [phone]  Patient Care Team: Jodi Marble, MD as PCP - General (Internal Medicine) Clent Jacks, RN as Oncology Nurse Navigator Sindy Guadeloupe, MD as Consulting Physician (Hematology and Oncology)   Name of the patient: Eric Simon  [id number]  [birth year]   Date of visit: 02/23/21  Diagnosis- metastatic colon cancer with lymph node metastases K-ras/BRAF wild-type  Chief complaint/ Reason for visit-on treatment assessment prior to cycle 32 of 5-FU Mvasi chemotherapy  Heme/Onc history: Patient is a70yrold male with >40 pack year history of smoking. He currently smokes 0.5ppd. he presented to the ER with symptoms of left sided chest pain and left arm pain. Troponin was negative ekg was unremarkable. Ct chest showed no PE. He was incidentally noted to have mediastinal and retrocrural adenopathy and a 2.1X2.3X4.4 cm retroaortic soft tissue lesion in the posterior left chest. 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 Low-grade activity in the left lateral fifth and sixth ribs associated with nondisplaced fractures likely reflecting healing response problem malignancy.  Patient underwent CT-guided biopsy of the retroperitoneal lymph node pathology showed metastatic adenocarcinoma compatible with colorectal origin. CK7 negative. CK20 positive. CDX 2+. TTF-1 negative. PSA negative. This pattern of immunoreactivity supports the above diagnosis. Patient underwent colonoscopy which showed sigmoid mass that was consistent with adenocarcinoma. RASpanel testing showed that he was wild-type for both K-ras and BRAF  FOLFOX and bevacizumab chemotherapy started in July 2020. Subsequently oxaliplatin has been on hold given neuropathy.  Interval  history-I see that patient was seen by Dr. AVicente Malesfor hemorrhoidal bleeding and pain but he has never complained about Hemorrhoid pain to me before.  He is dealing with some chronic constipation and Dr. AVicente Maleshas started him on lactulose recently.  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  HENT: Negative for congestion, ear discharge and nosebleeds.   Eyes: Negative for blurred vision.  Respiratory: Negative for cough, hemoptysis, sputum production, shortness of breath and wheezing.   Cardiovascular: Negative for chest pain, palpitations, orthopnea and claudication.  Gastrointestinal: Positive for constipation. Negative for abdominal pain, blood in stool, diarrhea, heartburn, melena, nausea and vomiting.  Genitourinary: Negative for dysuria, flank pain, frequency, hematuria and urgency.  Musculoskeletal: Negative for back pain, joint pain and myalgias.  Skin: Negative for rash.  Neurological: Positive for sensory change (Peripheral neuropathy). Negative for dizziness, tingling, focal weakness, seizures, weakness and headaches.  Endo/Heme/Allergies: Does not bruise/bleed easily.  Psychiatric/Behavioral: Negative for depression and suicidal ideas. The patient does not have insomnia.       No Known Allergies   Past Medical History:  Diagnosis Date  . Colon cancer ([place]   . COPD (chronic obstructive pulmonary disease) ([place]   . Hyperlipidemia      Past Surgical History:  Procedure Laterality Date  . COLONOSCOPY WITH PROPOFOL N/A 06/26/2019   Procedure: COLONOSCOPY WITH PROPOFOL;  Surgeon: AJonathon Bellows MD;  [facility];  Service: Gastroenterology;  Laterality: N/A;  . PORTA CATH INSERTION N/A 06/25/2019   Procedure: PORTA CATH INSERTION;  Surgeon: DAlgernon Huxley MD;  [facility];  Service: Cardiovascular;   Laterality: N/A;  . PORTA CATH INSERTION Left 08/11/2020   Procedure: PORTA CATH INSERTION;  Surgeon: Algernon Huxley, MD;  Location: [place] [facility];  Service: Cardiovascular;  Laterality: Left;  . PORTA CATH REMOVAL Right 08/11/2020   Procedure: PORTA CATH REMOVAL;  Surgeon: Algernon Huxley, MD;  Location: [place] [facility];  Service: Cardiovascular;  Laterality: Right;    Social History   Socioeconomic History  . Marital status: Single    Spouse name: Not on file  . Number of children: Not on file  . Years of education: Not on file  . Highest education level: Not on file  Occupational History  . Not on file  Tobacco Use  . Smoking status: Current Every Day Smoker    Packs/day: 0.50    Years: 40.00    Pack years: 20.00    Types: Cigarettes  . Smokeless tobacco: Never Used  . Tobacco comment: smoking 40 years  Vaping Use  . Vaping Use: Never used  Substance and Sexual Activity  . Alcohol use: No  . Drug use: No  . Sexual activity: Not Currently  Other Topics Concern  . Not on file  Social History Narrative  . Not on file   Social Determinants of Health   Financial Resource Strain: Not on file  Food Insecurity: Not on file  Transportation Needs: Not on file  Physical Activity: Not on file  Stress: Not on file  Social Connections: Not on file  Intimate Partner Violence: Not on file    Family History  Problem Relation Age of Onset  . Brain cancer Father      Current Outpatient Medications:  .  aspirin EC 81 MG tablet, Take 81 mg by mouth daily., Disp: , Rfl:  .  azelastine (ASTELIN) 0.1 % nasal spray, Place 1 spray into both nostrils 1 day or 1 dose., Disp: , Rfl:  .  butalbital-acetaminophen-caffeine (FIORICET) 50-325-40 MG tablet, Take 1 tablet by mouth every 4 (four) hours as needed., Disp: , Rfl:  .  chlorthalidone (HYGROTON) 25 MG tablet, Take 1 tablet by mouth daily., Disp: , Rfl:  .  DEXILANT 60 MG capsule, Take 1 capsule by mouth 1 day or 1 dose., Disp:  , Rfl:  .  DULoxetine (CYMBALTA) 30 MG capsule, Take 1 capsule (30 mg total) by mouth 2 (two) times daily., Disp: 60 capsule, Rfl: 1 .  fluticasone (FLONASE) 50 MCG/ACT nasal spray, Place 2 sprays into both nostrils 1 day or 1 dose. , Disp: , Rfl:  .  pantoprazole (PROTONIX) 40 MG tablet, Take 40 mg by mouth daily. , Disp: , Rfl:  .  pregabalin (LYRICA) 75 MG capsule, Take 1 capsule (75 mg total) by mouth 2 (two) times daily., Disp: 60 capsule, Rfl: 2 .  simvastatin (ZOCOR) 20 MG tablet, Take 20 mg by mouth daily. , Disp: , Rfl:  .  SYMBICORT 80-4.5 MCG/ACT inhaler, Inhale 2 puffs into the lungs 1 day or 1 dose. , Disp: , Rfl:  .  tamsulosin (FLOMAX) 0.4 MG CAPS capsule, Take 0.4 mg by mouth. , Disp: , Rfl:  .  cetirizine (ZYRTEC) 10 MG tablet, Take 1 tablet (10 mg total) by mouth daily. (Patient not taking: No sig reported), Disp: 30 tablet, Rfl: 0 .  dexamethasone (DECADRON) 4 MG tablet, Take 2 tablets (8 mg total) by mouth daily. Start the day after chemotherapy for 2 days. Take with food. (Patient not taking: Reported on 02/23/2021), Disp: 30 tablet, Rfl: 1 .  fluticasone (VERAMYST) 27.5 MCG/SPRAY nasal spray, Place 2 sprays into the nose daily.  (Patient  not taking: No sig reported), Disp: , Rfl:  .  lactulose (CHRONULAC) 10 GM/15ML solution, Take 30 mLs (20 g total) by mouth 3 (three) times daily. (Patient not taking: Reported on 02/23/2021), Disp: 8100 mL, Rfl: 1 .  lidocaine-prilocaine (EMLA) cream, Apply to affected area once, Disp: 30 g, Rfl: 3 .  magic mouthwash w/lidocaine SOLN, Take 5-10 mLs by mouth 4 (four) times daily as needed for mouth pain. (Patient not taking: No sig reported), Disp: 480 mL, Rfl: 3 .  montelukast (SINGULAIR) 10 MG tablet, Take 10 mg by mouth at bedtime.  (Patient not taking: Reported on 02/23/2021), Disp: , Rfl:  .  nicotine (NICODERM CQ - DOSED IN MG/24 HOURS) 21 mg/24hr patch, Place 1 patch onto the skin 1 day or 1 dose.  (Patient not taking: No sig reported), Disp:  , Rfl:  .  ondansetron (ZOFRAN) 8 MG tablet, Take 1 tablet (8 mg total) by mouth 2 (two) times daily as needed for refractory nausea / vomiting. Start on day 3 after chemotherapy. (Patient not taking: No sig reported), Disp: 30 tablet, Rfl: 1 .  oxyCODONE (OXY IR/ROXICODONE) 5 MG immediate release tablet, Take 1 tablet (5 mg total) by mouth every 6 (six) hours as needed for severe pain. (Patient not taking: No sig reported), Disp: 30 tablet, Rfl: 0 .  potassium chloride SA (K-DUR) 20 MEQ tablet, Take 1 tablet (20 mEq total) by mouth daily. (Patient not taking: No sig reported), Disp: 14 tablet, Rfl: 0 .  prochlorperazine (COMPAZINE) 10 MG tablet, Take 1 tablet (10 mg total) by mouth every 6 (six) hours as needed (Nausea or vomiting). (Patient not taking: No sig reported), Disp: 30 tablet, Rfl: 1 No current facility-administered medications for this visit.  Facility-Administered Medications Ordered in Other Visits:  .  0.9 %  sodium chloride infusion, , Intravenous, Continuous, Sindy Guadeloupe, MD, Last Rate: 10 mL/hr at 02/23/21 1001, New Bag at 02/23/21 1001 .  fluorouracil (ADRUCIL) 4,300 mg in sodium chloride 0.9 % 64 mL chemo infusion, 2,400 mg/m2 (Treatment Plan Recorded), Intravenous, 1 day or 1 dose, Sindy Guadeloupe, MD .  fluorouracil (ADRUCIL) chemo injection 700 mg, 400 mg/m2 (Treatment Plan Recorded), Intravenous, Once, Sindy Guadeloupe, MD .  leucovorin 700 mg in sodium chloride 0.9 % 250 mL infusion, 700 mg, Intravenous, Once, Sindy Guadeloupe, MD, Last Rate: 570 mL/hr at 02/23/21 1050, 700 mg at 02/23/21 1050 .  sodium chloride flush (NS) 0.9 % injection 10 mL, 10 mL, Intracatheter, PRN, Sindy Guadeloupe, MD, 10 mL at 08/15/19 1405  Physical exam:  Vitals:   02/23/21 0903  BP: 138/69  Pulse: 75  Resp: 16  Temp: 98.1 F (36.7 C)  TempSrc: Tympanic  SpO2: 100%  Weight: 145 lb 1.6 oz (65.8 kg)   Physical Exam Cardiovascular:     Rate and Rhythm: Normal rate and regular rhythm.      Heart sounds: Normal heart sounds.  Pulmonary:     Effort: Pulmonary effort is normal.  Skin:    General: Skin is warm and dry.  Neurological:     Mental Status: He is alert and oriented to person, place, and time.      CMP Latest Ref Rng & Units 02/23/2021  Glucose 70 - 99 mg/dL 98  BUN 8 - 23 mg/dL 23  Creatinine 0.61 - 1.24 mg/dL 0.88  Sodium 135 - 145 mmol/L 134(L)  Potassium 3.5 - 5.1 mmol/L 3.9  Chloride 98 - 111 mmol/L 103  CO2  22 - 32 mmol/L 25  Calcium 8.9 - 10.3 mg/dL 8.9  Total Protein 6.5 - 8.1 g/dL 7.4  Total Bilirubin 0.3 - 1.2 mg/dL 0.8  Alkaline Phos 38 - 126 U/L 114  AST 15 - 41 U/L 39  ALT 0 - 44 U/L 24   CBC Latest Ref Rng & Units 02/23/2021  WBC 4.0 - 10.5 K/uL 10.1  Hemoglobin 13.0 - 17.0 g/dL 13.4  Hematocrit 39.0 - 52.0 % 39.3  Platelets 150 - 400 K/uL 117(L)       Assessment and plan- Patient is a 70 y.o. male with metastatic colon cancer here for on treatment assessment prior to cycle 32 of 5-FU Mvasi chemotherapy  Counts okay to proceed with cycle 32 of 5-FU Mvasi chemotherapy today.  I will see him back in 3 weeks for cycle 33.  He does receive Udenyca on day 3.  Overall tolerating chemotherapy well without any significant side effects.  Constipation: He follows up with GI and was started on lactulose.  I will plan to get repeat scans in 2 months   Visit Diagnosis 1. 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 ([place])   2. Encounter for monoclonal antibody treatment for malignancy   3. Encounter for antineoplastic chemotherapy      Dr. Randa Evens, MD, MPH [facility] at [facility] [phone] 02/23/2021 11:21 AM

## 2021-02-25 ENCOUNTER — Inpatient Hospital Stay: Payer: Medicare HMO

## 2021-02-25 DIAGNOSIS — Z5112 Encounter for antineoplastic immunotherapy: Secondary | ICD-10-CM | POA: Diagnosis not present

## 2021-02-25 DIAGNOSIS — C189 Malignant neoplasm of colon, unspecified: Secondary | ICD-10-CM

## 2021-02-25 DIAGNOSIS — C772 Secondary and unspecified malignant neoplasm of intra-abdominal lymph nodes: Secondary | ICD-10-CM

## 2021-02-25 MED ORDER — PEGFILGRASTIM-CBQV 6 MG/0.6ML ~~LOC~~ SOSY
6.0000 mg | PREFILLED_SYRINGE | Freq: Once | SUBCUTANEOUS | Status: AC
  Administered 2021-02-25: 6 mg via SUBCUTANEOUS
  Filled 2021-02-25: qty 0.6

## 2021-02-25 MED ORDER — SODIUM CHLORIDE 0.9% FLUSH
10.0000 mL | INTRAVENOUS | Status: DC | PRN
  Administered 2021-02-25: 10 mL
  Filled 2021-02-25: qty 10

## 2021-02-25 MED ORDER — HEPARIN SOD (PORK) LOCK FLUSH 100 UNIT/ML IV SOLN
500.0000 [IU] | Freq: Once | INTRAVENOUS | Status: AC | PRN
  Administered 2021-02-25: 500 [IU]
  Filled 2021-02-25: qty 5

## 2021-02-25 MED ORDER — HEPARIN SOD (PORK) LOCK FLUSH 100 UNIT/ML IV SOLN
INTRAVENOUS | Status: AC
  Filled 2021-02-25: qty 5

## 2021-03-16 ENCOUNTER — Other Ambulatory Visit: Payer: Self-pay | Admitting: *Deleted

## 2021-03-16 ENCOUNTER — Inpatient Hospital Stay: Payer: Medicare HMO | Attending: Oncology

## 2021-03-16 ENCOUNTER — Inpatient Hospital Stay (HOSPITAL_BASED_OUTPATIENT_CLINIC_OR_DEPARTMENT_OTHER): Payer: Medicare HMO | Admitting: Oncology

## 2021-03-16 ENCOUNTER — Inpatient Hospital Stay: Payer: Medicare HMO

## 2021-03-16 ENCOUNTER — Encounter: Payer: Self-pay | Admitting: Oncology

## 2021-03-16 VITALS — BP 117/72 | Temp 97.3°F | Resp 16 | Wt 147.9 lb

## 2021-03-16 DIAGNOSIS — C189 Malignant neoplasm of colon, unspecified: Secondary | ICD-10-CM

## 2021-03-16 DIAGNOSIS — C187 Malignant neoplasm of sigmoid colon: Secondary | ICD-10-CM | POA: Diagnosis present

## 2021-03-16 DIAGNOSIS — C772 Secondary and unspecified malignant neoplasm of intra-abdominal lymph nodes: Secondary | ICD-10-CM

## 2021-03-16 DIAGNOSIS — Z452 Encounter for adjustment and management of vascular access device: Secondary | ICD-10-CM | POA: Insufficient documentation

## 2021-03-16 DIAGNOSIS — Z5189 Encounter for other specified aftercare: Secondary | ICD-10-CM | POA: Insufficient documentation

## 2021-03-16 DIAGNOSIS — Z5112 Encounter for antineoplastic immunotherapy: Secondary | ICD-10-CM

## 2021-03-16 DIAGNOSIS — Z5111 Encounter for antineoplastic chemotherapy: Secondary | ICD-10-CM | POA: Insufficient documentation

## 2021-03-16 DIAGNOSIS — D696 Thrombocytopenia, unspecified: Secondary | ICD-10-CM | POA: Insufficient documentation

## 2021-03-16 DIAGNOSIS — D6959 Other secondary thrombocytopenia: Secondary | ICD-10-CM

## 2021-03-16 DIAGNOSIS — T451X5A Adverse effect of antineoplastic and immunosuppressive drugs, initial encounter: Secondary | ICD-10-CM

## 2021-03-16 LAB — CBC WITH DIFFERENTIAL/PLATELET
Abs Immature Granulocytes: 0.1 10*3/uL — ABNORMAL HIGH (ref 0.00–0.07)
Basophils Absolute: 0.1 10*3/uL (ref 0.0–0.1)
Basophils Relative: 1 %
Eosinophils Absolute: 0.1 10*3/uL (ref 0.0–0.5)
Eosinophils Relative: 1 %
HCT: 39.1 % (ref 39.0–52.0)
Hemoglobin: 13 g/dL (ref 13.0–17.0)
Immature Granulocytes: 1 %
Lymphocytes Relative: 15 %
Lymphs Abs: 1.9 10*3/uL (ref 0.7–4.0)
MCH: 31.6 pg (ref 26.0–34.0)
MCHC: 33.2 g/dL (ref 30.0–36.0)
MCV: 95.1 fL (ref 80.0–100.0)
Monocytes Absolute: 0.9 10*3/uL (ref 0.1–1.0)
Monocytes Relative: 7 %
Neutro Abs: 9.3 10*3/uL — ABNORMAL HIGH (ref 1.7–7.7)
Neutrophils Relative %: 75 %
Platelets: 99 10*3/uL — ABNORMAL LOW (ref 150–400)
RBC: 4.11 MIL/uL — ABNORMAL LOW (ref 4.22–5.81)
RDW: 16.8 % — ABNORMAL HIGH (ref 11.5–15.5)
WBC: 12.4 10*3/uL — ABNORMAL HIGH (ref 4.0–10.5)
nRBC: 0 % (ref 0.0–0.2)

## 2021-03-16 LAB — COMPREHENSIVE METABOLIC PANEL
ALT: 32 U/L (ref 0–44)
AST: 43 U/L — ABNORMAL HIGH (ref 15–41)
Albumin: 3.6 g/dL (ref 3.5–5.0)
Alkaline Phosphatase: 104 U/L (ref 38–126)
Anion gap: 9 (ref 5–15)
BUN: 42 mg/dL — ABNORMAL HIGH (ref 8–23)
CO2: 24 mmol/L (ref 22–32)
Calcium: 8.9 mg/dL (ref 8.9–10.3)
Chloride: 103 mmol/L (ref 98–111)
Creatinine, Ser: 0.82 mg/dL (ref 0.61–1.24)
GFR, Estimated: >60 mL/min (ref >60)
Glucose, Bld: 98 mg/dL (ref 70–99)
Potassium: 3.9 mmol/L (ref 3.5–5.1)
Sodium: 136 mmol/L (ref 135–145)
Total Bilirubin: 0.8 mg/dL (ref 0.3–1.2)
Total Protein: 7 g/dL (ref 6.5–8.1)

## 2021-03-16 LAB — PROTEIN, URINE, RANDOM: Total Protein, Urine: 19 mg/dL

## 2021-03-16 MED ORDER — DEXAMETHASONE SODIUM PHOSPHATE 10 MG/ML IJ SOLN: 10.0000 mg | Freq: Once | INTRAMUSCULAR | Status: DC

## 2021-03-16 MED ORDER — SODIUM CHLORIDE 0.9 % IV SOLN
10.0000 mg | Freq: Once | INTRAVENOUS | Status: AC
  Administered 2021-03-16: 10 mg via INTRAVENOUS
  Filled 2021-03-16: qty 10

## 2021-03-16 MED ORDER — SODIUM CHLORIDE 0.9 % IV SOLN
INTRAVENOUS | Status: DC
  Filled 2021-03-16: qty 250

## 2021-03-16 MED ORDER — SODIUM CHLORIDE 0.9 % IV SOLN
700.0000 mg | Freq: Once | INTRAVENOUS | Status: AC
  Administered 2021-03-16: 700 mg via INTRAVENOUS
  Filled 2021-03-16: qty 35

## 2021-03-16 MED ORDER — SODIUM CHLORIDE 0.9 % IV SOLN
2400.0000 mg/m2 | INTRAVENOUS | Status: DC
  Administered 2021-03-16: 4300 mg via INTRAVENOUS
  Filled 2021-03-16: qty 86

## 2021-03-16 MED ORDER — FLUOROURACIL CHEMO INJECTION 2.5 GM/50ML
400.0000 mg/m2 | Freq: Once | INTRAVENOUS | Status: AC
  Administered 2021-03-16: 700 mg via INTRAVENOUS
  Filled 2021-03-16: qty 14

## 2021-03-16 MED ORDER — SODIUM CHLORIDE 0.9 % IV SOLN
7.5000 mg/kg | Freq: Once | INTRAVENOUS | Status: AC
  Administered 2021-03-16: 500 mg via INTRAVENOUS
  Filled 2021-03-16: qty 4

## 2021-03-16 NOTE — Progress Notes (Signed)
Hematology/Oncology Consult note [facility]  Telephone:([phone] [phone]  Patient Care Team: Jodi Marble, MD as PCP - General (Internal Medicine) Clent Jacks, RN as Oncology Nurse Navigator Sindy Guadeloupe, MD as Consulting Physician (Hematology and Oncology)   Name of the patient: Eric Simon  [id number]  [birth year]   Date of visit: 03/16/21  Diagnosis- metastatic colon cancer with lymph node metastases K-ras/BRAF wild-type  Chief complaint/ Reason for visit-on treatment assessment prior to cycle 33 of 5-FU Mvasi chemotherapy  Heme/Onc history: Patient is a70yrold male with >40 pack year history of smoking. He currently smokes 0.5ppd. he presented to the ER with symptoms of left sided chest pain and left arm pain. Troponin was negative ekg was unremarkable. Ct chest showed no PE. He was incidentally noted to have mediastinal and retrocrural adenopathy and a 2.1X2.3X4.4 cm retroaortic soft tissue lesion in the posterior left chest. 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 Low-grade activity in the left lateral fifth and sixth ribs associated with nondisplaced fractures likely reflecting healing response problem malignancy.  Patient underwent CT-guided biopsy of the retroperitoneal lymph node pathology showed metastatic adenocarcinoma compatible with colorectal origin. CK7 negative. CK20 positive. CDX 2+. TTF-1 negative. PSA negative. This pattern of immunoreactivity supports the above diagnosis. Patient underwent colonoscopy which showed sigmoid mass that was consistent with adenocarcinoma. RASpanel testing showed that he was wild-type for both K-ras and BRAF  FOLFOX and bevacizumab chemotherapy started in July 2020. Subsequently oxaliplatin has been on hold given neuropathy.   Interval  history-patient reports doing well and denies any complaints at this time.  Neuropathy symptoms are presently stable.  ECOG PS- 1 Pain scale- 0   Review of systems- Review of Systems  Constitutional: Negative for chills, fever, malaise/fatigue and weight loss.  HENT: Negative for congestion, ear discharge and nosebleeds.   Eyes: Negative for blurred vision.  Respiratory: Negative for cough, hemoptysis, sputum production, shortness of breath and wheezing.   Cardiovascular: Negative for chest pain, palpitations, orthopnea and claudication.  Gastrointestinal: Negative for abdominal pain, blood in stool, constipation, diarrhea, heartburn, melena, nausea and vomiting.  Genitourinary: Negative for dysuria, flank pain, frequency, hematuria and urgency.  Musculoskeletal: Negative for back pain, joint pain and myalgias.  Skin: Negative for rash.  Neurological: Positive for sensory change (Peripheral neuropathy). Negative for dizziness, tingling, focal weakness, seizures, weakness and headaches.  Endo/Heme/Allergies: Does not bruise/bleed easily.  Psychiatric/Behavioral: Negative for depression and suicidal ideas. The patient does not have insomnia.       No Known Allergies   Past Medical History:  Diagnosis Date  . Colon cancer ([place]   . COPD (chronic obstructive pulmonary disease) ([place]   . Hyperlipidemia      Past Surgical History:  Procedure Laterality Date  . COLONOSCOPY WITH PROPOFOL N/A 06/26/2019   Procedure: COLONOSCOPY WITH PROPOFOL;  Surgeon: AJonathon Bellows MD;  [facility];  Service: Gastroenterology;  Laterality: N/A;  . PORTA CATH INSERTION N/A 06/25/2019   Procedure: PORTA CATH INSERTION;  Surgeon: DAlgernon Huxley MD;  [facility];  Service: Cardiovascular;  Laterality: N/A;  . PORTA CATH INSERTION Left 08/11/2020   Procedure: PORTA CATH INSERTION;  Surgeon: DAlgernon Huxley MD;  [facility];  Service: Cardiovascular;  Laterality:  Left;  . PORTA CATH REMOVAL Right 08/11/2020   Procedure: PORTA CATH REMOVAL;  Surgeon:  Algernon Huxley, MD;  Location: [place] [facility] LAB;  Service: Cardiovascular;  Laterality: Right;    Social History   Socioeconomic History  . Marital status: Single    Spouse name: Not on file  . Number of children: Not on file  . Years of education: Not on file  . Highest education level: Not on file  Occupational History  . Not on file  Tobacco Use  . Smoking status: Current Every Day Smoker    Packs/day: 0.50    Years: 40.00    Pack years: 20.00    Types: Cigarettes  . Smokeless tobacco: Never Used  . Tobacco comment: smoking 40 years  Vaping Use  . Vaping Use: Never used  Substance and Sexual Activity  . Alcohol use: No  . Drug use: No  . Sexual activity: Not Currently  Other Topics Concern  . Not on file  Social History Narrative  . Not on file   Social Determinants of Health   Financial Resource Strain: Not on file  Food Insecurity: Not on file  Transportation Needs: Not on file  Physical Activity: Not on file  Stress: Not on file  Social Connections: Not on file  Intimate Partner Violence: Not on file    Family History  Problem Relation Age of Onset  . Brain cancer Father      Current Outpatient Medications:  .  aspirin EC 81 MG tablet, Take 81 mg by mouth daily., Disp: , Rfl:  .  azelastine (ASTELIN) 0.1 % nasal spray, Place 1 spray into both nostrils 1 day or 1 dose., Disp: , Rfl:  .  butalbital-acetaminophen-caffeine (FIORICET) 50-325-40 MG tablet, Take 1 tablet by mouth every 4 (four) hours as needed., Disp: , Rfl:  .  cetirizine (ZYRTEC) 10 MG tablet, Take 1 tablet (10 mg total) by mouth daily., Disp: 30 tablet, Rfl: 0 .  chlorthalidone (HYGROTON) 25 MG tablet, Take 1 tablet by mouth daily., Disp: , Rfl:  .  dexamethasone (DECADRON) 4 MG tablet, Take 2 tablets (8 mg total) by mouth daily. Start the day after chemotherapy for 2 days. Take with food., Disp: 30  tablet, Rfl: 1 .  DEXILANT 60 MG capsule, Take 1 capsule by mouth 1 day or 1 dose., Disp: , Rfl:  .  DULoxetine (CYMBALTA) 30 MG capsule, Take 1 capsule (30 mg total) by mouth 2 (two) times daily., Disp: 60 capsule, Rfl: 1 .  fluticasone (FLONASE) 50 MCG/ACT nasal spray, Place 2 sprays into both nostrils 1 day or 1 dose. , Disp: , Rfl:  .  fluticasone (VERAMYST) 27.5 MCG/SPRAY nasal spray, Place 2 sprays into the nose daily., Disp: , Rfl:  .  lactulose (CHRONULAC) 10 GM/15ML solution, Take 30 mLs (20 g total) by mouth 3 (three) times daily., Disp: 8100 mL, Rfl: 1 .  lidocaine-prilocaine (EMLA) cream, Apply to affected area once, Disp: 30 g, Rfl: 3 .  magic mouthwash w/lidocaine SOLN, Take 5-10 mLs by mouth 4 (four) times daily as needed for mouth pain., Disp: 480 mL, Rfl: 3 .  montelukast (SINGULAIR) 10 MG tablet, Take 10 mg by mouth at bedtime., Disp: , Rfl:  .  nicotine (NICODERM CQ - DOSED IN MG/24 HOURS) 21 mg/24hr patch, Place 1 patch onto the skin 1 day or 1 dose., Disp: , Rfl:  .  ondansetron (ZOFRAN) 8 MG tablet, Take 1 tablet (8 mg total) by mouth 2 (two) times daily as needed for refractory nausea / vomiting. Start on day 3 after chemotherapy.,  Disp: 30 tablet, Rfl: 1 .  oxyCODONE (OXY IR/ROXICODONE) 5 MG immediate release tablet, Take 1 tablet (5 mg total) by mouth every 6 (six) hours as needed for severe pain., Disp: 30 tablet, Rfl: 0 .  pantoprazole (PROTONIX) 40 MG tablet, Take 40 mg by mouth daily. , Disp: , Rfl:  .  potassium chloride SA (K-DUR) 20 MEQ tablet, Take 1 tablet (20 mEq total) by mouth daily., Disp: 14 tablet, Rfl: 0 .  pregabalin (LYRICA) 75 MG capsule, Take 1 capsule (75 mg total) by mouth 2 (two) times daily., Disp: 60 capsule, Rfl: 2 .  prochlorperazine (COMPAZINE) 10 MG tablet, Take 1 tablet (10 mg total) by mouth every 6 (six) hours as needed (Nausea or vomiting)., Disp: 30 tablet, Rfl: 1 .  simvastatin (ZOCOR) 20 MG tablet, Take 20 mg by mouth daily. , Disp: , Rfl:   .  SYMBICORT 80-4.5 MCG/ACT inhaler, Inhale 2 puffs into the lungs 1 day or 1 dose. , Disp: , Rfl:  .  tamsulosin (FLOMAX) 0.4 MG CAPS capsule, Take 0.4 mg by mouth. , Disp: , Rfl:  No current facility-administered medications for this visit.  Facility-Administered Medications Ordered in Other Visits:  .  sodium chloride flush (NS) 0.9 % injection 10 mL, 10 mL, Intracatheter, PRN, Sindy Guadeloupe, MD, 10 mL at 08/15/19 1405  Physical exam:  Vitals:   03/16/21 0839  BP: 117/72  Resp: 16  Temp: (!) 97.3 F (36.3 C)  TempSrc: Tympanic  SpO2: 100%  Weight: 147 lb 14.4 oz (67.1 kg)   Physical Exam Constitutional:      General: He is not in acute distress. Cardiovascular:     Rate and Rhythm: Normal rate and regular rhythm.     Heart sounds: Normal heart sounds.  Pulmonary:     Effort: Pulmonary effort is normal.     Breath sounds: Normal breath sounds.  Abdominal:     General: Bowel sounds are normal.     Palpations: Abdomen is soft.  Skin:    General: Skin is warm and dry.  Neurological:     Mental Status: He is alert and oriented to person, place, and time.      CMP Latest Ref Rng & Units 03/16/2021  Glucose 70 - 99 mg/dL 98  BUN 8 - 23 mg/dL 42(H)  Creatinine 0.61 - 1.24 mg/dL 0.82  Sodium 135 - 145 mmol/L 136  Potassium 3.5 - 5.1 mmol/L 3.9  Chloride 98 - 111 mmol/L 103  CO2 22 - 32 mmol/L 24  Calcium 8.9 - 10.3 mg/dL 8.9  Total Protein 6.5 - 8.1 g/dL 7.0  Total Bilirubin 0.3 - 1.2 mg/dL 0.8  Alkaline Phos 38 - 126 U/L 104  AST 15 - 41 U/L 43(H)  ALT 0 - 44 U/L 32   CBC Latest Ref Rng & Units 03/16/2021  WBC 4.0 - 10.5 K/uL 12.4(H)  Hemoglobin 13.0 - 17.0 g/dL 13.0  Hematocrit 39.0 - 52.0 % 39.1  Platelets 150 - 400 K/uL 99(L)     Assessment and plan- Patient is a 70 y.o. male with metastatic colon cancer here for on treatment assessment prior to cycle 33 of 5-FU Mvasi chemotherapy  Patient does have mild thrombocytopenia with a platelet count of 99.  Over  the last 6 months his platelet counts are slowly drifted down from 130s and more recently they have remained stable between 100s to 110s.  If his platelet count was low around the next visit I will consider reducing the  dose of 5-FU chemotherapy.  He will come on day 3 for pump disconnect and receives [place].    Repeat CT chest abdomen and pelvis with contrast 2 weeks from now and I will see him back in 3 weeks with CBC with differential, CMP and urine protein to cycle 34 of 5-FU Mvasi chemotherapy   Visit Diagnosis 1. Encounter for antineoplastic chemotherapy   2. Encounter for monoclonal antibody treatment for malignancy   3. 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 ([place])      Dr. Randa Evens, MD, MPH [facility] [phone] 03/16/2021 9:10 AM

## 2021-03-16 NOTE — Progress Notes (Signed)
Plt 99 ok to treat per MD

## 2021-03-16 NOTE — Progress Notes (Signed)
Patient  Here for pre treatment check he states that there are no changes.

## 2021-03-16 NOTE — Progress Notes (Signed)
Ok per MD to tx with plt 99.

## 2021-03-17 LAB — CEA: CEA: 5.3 ng/mL — ABNORMAL HIGH (ref 0.0–4.7)

## 2021-03-18 ENCOUNTER — Telehealth: Payer: Self-pay | Admitting: Gastroenterology

## 2021-03-18 ENCOUNTER — Inpatient Hospital Stay: Payer: Medicare HMO

## 2021-03-18 ENCOUNTER — Other Ambulatory Visit: Payer: Self-pay

## 2021-03-18 VITALS — BP 117/65 | HR 83 | Temp 98.5°F | Resp 18

## 2021-03-18 DIAGNOSIS — Z5112 Encounter for antineoplastic immunotherapy: Secondary | ICD-10-CM | POA: Diagnosis not present

## 2021-03-18 DIAGNOSIS — C189 Malignant neoplasm of colon, unspecified: Secondary | ICD-10-CM

## 2021-03-18 DIAGNOSIS — C772 Secondary and unspecified malignant neoplasm of intra-abdominal lymph nodes: Secondary | ICD-10-CM

## 2021-03-18 MED ORDER — SODIUM CHLORIDE 0.9% FLUSH
10.0000 mL | INTRAVENOUS | Status: DC | PRN
  Administered 2021-03-18: 10 mL
  Filled 2021-03-18: qty 10

## 2021-03-18 MED ORDER — HEPARIN SOD (PORK) LOCK FLUSH 100 UNIT/ML IV SOLN
500.0000 [IU] | Freq: Once | INTRAVENOUS | Status: AC | PRN
  Administered 2021-03-18: 500 [IU]
  Filled 2021-03-18: qty 5

## 2021-03-18 MED ORDER — HEPARIN SOD (PORK) LOCK FLUSH 100 UNIT/ML IV SOLN
INTRAVENOUS | Status: AC
  Filled 2021-03-18: qty 5

## 2021-03-18 MED ORDER — PEGFILGRASTIM-CBQV 6 MG/0.6ML ~~LOC~~ SOSY
6.0000 mg | PREFILLED_SYRINGE | Freq: Once | SUBCUTANEOUS | Status: AC
  Administered 2021-03-18: 6 mg via SUBCUTANEOUS
  Filled 2021-03-18: qty 0.6

## 2021-03-18 NOTE — Telephone Encounter (Signed)
Returned patients call. LVM to call office back. 

## 2021-03-18 NOTE — Telephone Encounter (Signed)
Call patient to advise on medication, patient states that he is bleeding more than he was on 02/10/21 when he was seen in the office.  Patient asks for advice on how to proceed

## 2021-03-18 NOTE — Telephone Encounter (Signed)
Please Advise

## 2021-03-19 NOTE — Telephone Encounter (Signed)
Jovon   Enquire if still constipated , if yes how much latulose is he taking ?  If not constipated and having daily soft bowel movements set up for flexible sigmoidoscopy to check if its the hemorroids or cancer which is bleeding

## 2021-03-20 ENCOUNTER — Ambulatory Visit: Payer: Medicare HMO

## 2021-03-20 ENCOUNTER — Other Ambulatory Visit: Payer: Medicare HMO

## 2021-03-20 ENCOUNTER — Telehealth: Payer: Self-pay

## 2021-03-20 ENCOUNTER — Ambulatory Visit: Payer: Medicare HMO | Admitting: Oncology

## 2021-03-20 NOTE — Telephone Encounter (Signed)
Spoke with patient about Dr. Georgeann Oppenheim recommendation/comments. Pt states he is still constipated and having trouble with bleeding. I told patient I will send Dr. [name] a message advising what the next steps should be. Pt verbalized understanding.

## 2021-03-20 NOTE — Telephone Encounter (Signed)
Spoke with patient and he states he is still constipated and bleeding. He says the lactulose helps some times. He is out of the nitroglycerin cream. Please advise

## 2021-03-23 ENCOUNTER — Telehealth: Payer: Self-pay

## 2021-03-23 NOTE — Telephone Encounter (Signed)
Increase lactulose to 30 cc QID daily If still bleeding set up for flexible sigmoidoscopy

## 2021-03-23 NOTE — Telephone Encounter (Signed)
Spoke with patient to inform of Dr. Georgeann Oppenheim recommendation/comments. Per Dr. [name]: 1. Increase lactulose to 30 cc QID daily 2. If still bleeding set up for flexible sigmoidoscopy   Told patient if symptoms have not improved by the end of the week to give [place] a call back. Pt verbalized understanding.

## 2021-04-01 ENCOUNTER — Ambulatory Visit
Admission: RE | Admit: 2021-04-01 | Discharge: 2021-04-01 | Disposition: A | Discharge status: Home / Self Care | Payer: Medicare HMO | Source: Ambulatory Visit | Attending: Oncology | Admitting: Oncology

## 2021-04-01 ENCOUNTER — Other Ambulatory Visit: Payer: Self-pay

## 2021-04-01 DIAGNOSIS — C189 Malignant neoplasm of colon, unspecified: Secondary | ICD-10-CM | POA: Insufficient documentation

## 2021-04-01 DIAGNOSIS — C772 Secondary and unspecified malignant neoplasm of intra-abdominal lymph nodes: Secondary | ICD-10-CM | POA: Insufficient documentation

## 2021-04-01 MED ORDER — IOHEXOL 300 MG/ML  SOLN
75.0000 mL | Freq: Once | INTRAMUSCULAR | Status: AC | PRN
  Administered 2021-04-01: 75 mL via INTRAVENOUS

## 2021-04-06 ENCOUNTER — Inpatient Hospital Stay (HOSPITAL_BASED_OUTPATIENT_CLINIC_OR_DEPARTMENT_OTHER): Payer: Medicare HMO | Admitting: Oncology

## 2021-04-06 ENCOUNTER — Encounter: Payer: Self-pay | Admitting: Oncology

## 2021-04-06 ENCOUNTER — Inpatient Hospital Stay: Payer: Medicare HMO

## 2021-04-06 VITALS — BP 124/75 | HR 71 | Temp 97.6°F | Resp 16 | Ht 67.0 in | Wt 145.8 lb

## 2021-04-06 DIAGNOSIS — C189 Malignant neoplasm of colon, unspecified: Secondary | ICD-10-CM

## 2021-04-06 DIAGNOSIS — Z5111 Encounter for antineoplastic chemotherapy: Secondary | ICD-10-CM | POA: Diagnosis not present

## 2021-04-06 DIAGNOSIS — C772 Secondary and unspecified malignant neoplasm of intra-abdominal lymph nodes: Secondary | ICD-10-CM

## 2021-04-06 DIAGNOSIS — Z5112 Encounter for antineoplastic immunotherapy: Secondary | ICD-10-CM | POA: Diagnosis not present

## 2021-04-06 LAB — CBC WITH DIFFERENTIAL/PLATELET
Abs Immature Granulocytes: 0.05 10*3/uL (ref 0.00–0.07)
Basophils Absolute: 0.1 10*3/uL (ref 0.0–0.1)
Basophils Relative: 1 %
Eosinophils Absolute: 0.1 10*3/uL (ref 0.0–0.5)
Eosinophils Relative: 1 %
HCT: 37.9 % — ABNORMAL LOW (ref 39.0–52.0)
Hemoglobin: 12.6 g/dL — ABNORMAL LOW (ref 13.0–17.0)
Immature Granulocytes: 1 %
Lymphocytes Relative: 18 %
Lymphs Abs: 1.7 10*3/uL (ref 0.7–4.0)
MCH: 31.4 pg (ref 26.0–34.0)
MCHC: 33.2 g/dL (ref 30.0–36.0)
MCV: 94.5 fL (ref 80.0–100.0)
Monocytes Absolute: 0.8 10*3/uL (ref 0.1–1.0)
Monocytes Relative: 8 %
Neutro Abs: 6.6 10*3/uL (ref 1.7–7.7)
Neutrophils Relative %: 71 %
Platelets: 95 10*3/uL — ABNORMAL LOW (ref 150–400)
RBC: 4.01 MIL/uL — ABNORMAL LOW (ref 4.22–5.81)
RDW: 17 % — ABNORMAL HIGH (ref 11.5–15.5)
WBC: 9.3 10*3/uL (ref 4.0–10.5)
nRBC: 0 % (ref 0.0–0.2)

## 2021-04-06 LAB — COMPREHENSIVE METABOLIC PANEL
ALT: 23 U/L (ref 0–44)
AST: 40 U/L (ref 15–41)
Albumin: 3.5 g/dL (ref 3.5–5.0)
Alkaline Phosphatase: 120 U/L (ref 38–126)
Anion gap: 10 (ref 5–15)
BUN: 29 mg/dL — ABNORMAL HIGH (ref 8–23)
CO2: 24 mmol/L (ref 22–32)
Calcium: 8.9 mg/dL (ref 8.9–10.3)
Chloride: 103 mmol/L (ref 98–111)
Creatinine, Ser: 1.05 mg/dL (ref 0.61–1.24)
GFR, Estimated: >60 mL/min (ref >60)
Glucose, Bld: 95 mg/dL (ref 70–99)
Potassium: 3.9 mmol/L (ref 3.5–5.1)
Sodium: 137 mmol/L (ref 135–145)
Total Bilirubin: 0.6 mg/dL (ref 0.3–1.2)
Total Protein: 7.3 g/dL (ref 6.5–8.1)

## 2021-04-06 LAB — PROTEIN, URINE, RANDOM: Total Protein, Urine: 12 mg/dL

## 2021-04-06 MED ORDER — DEXTROSE 5 % IV SOLN
Freq: Once | INTRAVENOUS | Status: AC
Start: 2021-04-06 — End: 2021-04-06
  Filled 2021-04-06: qty 250

## 2021-04-06 MED ORDER — DEXAMETHASONE SODIUM PHOSPHATE 10 MG/ML IJ SOLN
10.0000 mg | Freq: Once | INTRAMUSCULAR | Status: AC
  Administered 2021-04-06: 10 mg via INTRAVENOUS

## 2021-04-06 MED ORDER — SODIUM CHLORIDE 0.9 % IV SOLN
7.5000 mg/kg | Freq: Once | INTRAVENOUS | Status: AC
  Administered 2021-04-06: 500 mg via INTRAVENOUS
  Filled 2021-04-06: qty 16

## 2021-04-06 MED ORDER — FLUOROURACIL CHEMO INJECTION 2.5 GM/50ML
400.0000 mg/m2 | Freq: Once | INTRAVENOUS | Status: AC
Start: 2021-04-06 — End: 2021-04-06
  Administered 2021-04-06: 700 mg via INTRAVENOUS
  Filled 2021-04-06: qty 14

## 2021-04-06 MED ORDER — SODIUM CHLORIDE 0.9 % IV SOLN
700.0000 mg | Freq: Once | INTRAVENOUS | Status: AC
  Administered 2021-04-06: 700 mg via INTRAVENOUS
  Filled 2021-04-06: qty 25

## 2021-04-06 MED ORDER — SODIUM CHLORIDE 0.9% FLUSH
10.0000 mL | Freq: Once | INTRAVENOUS | Status: AC
  Administered 2021-04-06: 10 mL via INTRAVENOUS
  Filled 2021-04-06: qty 10

## 2021-04-06 MED ORDER — DEXAMETHASONE SODIUM PHOSPHATE 10 MG/ML IJ SOLN
INTRAMUSCULAR | Status: AC
  Filled 2021-04-06: qty 1

## 2021-04-06 MED ORDER — SODIUM CHLORIDE 0.9 % IV SOLN
2400.0000 mg/m2 | INTRAVENOUS | Status: DC
  Administered 2021-04-06: 4300 mg via INTRAVENOUS
  Filled 2021-04-06: qty 86

## 2021-04-06 NOTE — Addendum Note (Signed)
Addended by: Kern Alberta on: 04/06/2021 10:16 AM   Modules accepted: Orders

## 2021-04-06 NOTE — Patient Instructions (Signed)
[facility]  Discharge Instructions: Thank you for choosing [facility] to provide your oncology and hematology care.  If you have a lab appointment with the [facility], please go directly to the [facility] and check in at the registration area.  Wear comfortable clothing and clothing appropriate for easy access to any Portacath or PICC line.   We strive to give you quality time with your provider. You may need to reschedule your appointment if you arrive late (15 or more minutes).  Arriving late affects you and other patients whose appointments are after yours.  Also, if you miss three or more appointments without notifying the office, you may be dismissed from the clinic at the provider's discrBevacizumab injection What is this medicine? BEVACIZUMAB (be va SIZ yoo mab) is a monoclonal antibody. It is used to treat many types of cancer. This medicine may be used for other purposes; ask your health care provider or pharmacist if you have questions. COMMON BRAND NAME(S): Avastin, MVASI, Zirabev What should I tell my health care provider before I take this medicine? They need to know if you have any of these conditions:  diabetes  heart disease  high blood pressure  history of coughing up blood  prior anthracycline chemotherapy (e.g., doxorubicin, daunorubicin, epirubicin)  recent or ongoing radiation therapy  recent or planning to have surgery  stroke  an unusual or allergic reaction to bevacizumab, hamster proteins, mouse proteins, other medicines, foods, dyes, or preservatives  pregnant or trying to get pregnant  breast-feeding How should I use this medicine? This medicine is for infusion into a vein. It is given by a health care professional in a hospital or clinic setting. Talk to your pediatrician regarding the use of this medicine in children. Special care may be needed. Overdosage: If you think you have taken too  much of this medicine contact a poison control center or emergency room at once. NOTE: This medicine is only for you. Do not share this medicine with others. What if I miss a dose? It is important not to miss your dose. Call your doctor or health care professional if you are unable to keep an appointment. What may interact with this medicine? Interactions are not expected. This list may not describe all possible interactions. Give your health care provider a list of all the medicines, herbs, non-prescription drugs, or dietary supplements you use. Also tell them if you smoke, drink alcohol, or use illegal drugs. Some items may interact with your medicine. What should I watch for while using this medicine? Your condition will be monitored carefully while you are receiving this medicine. You will need important blood work and urine testing done while you are taking this medicine. This medicine may increase your risk to bruise or bleed. Call your doctor or health care professional if you notice any unusual bleeding. Before having surgery, talk to your health care provider to make sure it is ok. This drug can increase the risk of poor healing of your surgical site or wound. You will need to stop this drug for 28 days before surgery. After surgery, wait at least 28 days before restarting this drug. Make sure the surgical site or wound is healed enough before restarting this drug. Talk to your health care provider if questions. Do not become pregnant while taking this medicine or for 6 months after stopping it. Women should inform their doctor if they wish to become pregnant or think they might be  pregnant. There is a potential for serious side effects to an unborn child. Talk to your health care professional or pharmacist for more information. Do not breast-feed an infant while taking this medicine and for 6 months after the last dose. This medicine has caused ovarian failure in some women. This medicine may  interfere with the ability to have a child. You should talk to your doctor or health care professional if you are concerned about your fertility. What side effects may I notice from receiving this medicine? Side effects that you should report to your doctor or health care professional as soon as possible:  allergic reactions like skin rash, itching or hives, swelling of the face, lips, or tongue  chest pain or chest tightness  chills  coughing up blood  high fever  seizures  severe constipation  signs and symptoms of bleeding such as bloody or black, tarry stools; red or dark-brown urine; spitting up blood or brown material that looks like coffee grounds; red spots on the skin; unusual bruising or bleeding from the eye, gums, or nose  signs and symptoms of a blood clot such as breathing problems; chest pain; severe, sudden headache; pain, swelling, warmth in the leg  signs and symptoms of a stroke like changes in vision; confusion; trouble speaking or understanding; severe headaches; sudden numbness or weakness of the face, arm or leg; trouble walking; dizziness; loss of balance or coordination  stomach pain  sweating  swelling of legs or ankles  vomiting  weight gain Side effects that usually do not require medical attention (report to your doctor or health care professional if they continue or are bothersome):  back pain  changes in taste  decreased appetite  dry skin  nausea  tiredness This list may not describe all possible side effects. Call your doctor for medical advice about side effects. You may report side effects to FDA at 1-800-FDA-1088. Where should I keep my medicine? This drug is given in a hospital or clinic and will not be stored at home. NOTE: This sheet is a summary. It may not cover all possible information. If you have questions about this medicine, talk to your doctor, pharmacist, or health care provider.  2021 Elsevier/Gold Standard  (2019-09-26 10:50:46) Fluorouracil, 5FU; Diclofenac topical cream What is this medicine? FLUOROURACIL; DICLOFENAC (flure oh YOOR a sil; dye KLOE fen ak) is a combination of a topical chemotherapy agent and non-steroidal anti-inflammatory drug (NSAID). It is used on the skin to treat skin cancer and skin conditions that could become cancer. This medicine may be used for other purposes; ask your health care provider or pharmacist if you have questions. COMMON BRAND NAME(S): FLUORAC What should I tell my health care provider before I take this medicine? They need to know if you have any of these conditions:  bleeding problems  cigarette smoker  DPD enzyme deficiency  heart disease  high blood pressure  if you frequently drink alcohol containing drinks  kidney disease  liver disease  open or infected skin  stomach problems  swelling or open sores at the treatment site  recent or planned coronary artery bypass graft (CABG) surgery  an unusual or allergic reaction to fluorouracil, diclofenac, aspirin, other NSAIDs, other medicines, foods, dyes, or preservatives  pregnant or trying to get pregnant  breast-feeding How should I use this medicine? This medicine is only for use on the skin. Follow the directions on the prescription label. Wash hands before and after use. Wash affected area and gently  pat dry. To apply this medicine use a cotton-tipped applicator, or use gloves if applying with fingertips. If applied with unprotected fingertips, it is very important to wash your hands well after you apply this medicine. Avoid applying to the eyes, nose, or mouth. Apply enough medicine to cover the affected area. You can cover the area with a light gauze dressing, but do not use tight or air-tight dressings. Finish the full course prescribed by your doctor or health care professional, even if you think your condition is better. Do not stop taking except on the advice of your doctor or  health care professional. Talk to your pediatrician regarding the use of this medicine in children. Special care may be needed. Overdosage: If you think you have taken too much of this medicine contact a poison control center or emergency room at once. NOTE: This medicine is only for you. Do not share this medicine with others. What if I miss a dose? If you miss a dose, apply it as soon as you can. If it is almost time for your next dose, only use that dose. Do not apply extra doses. Contact your doctor or health care professional if you miss more than one dose. What may interact with this medicine? Interactions are not expected. Do not use any other skin products without telling your doctor or health care professional. This list may not describe all possible interactions. Give your health care provider a list of all the medicines, herbs, non-prescription drugs, or dietary supplements you use. Also tell them if you smoke, drink alcohol, or use illegal drugs. Some items may interact with your medicine. What should I watch for while using this medicine? Visit your doctor or healthcare provider for checks on your progress. You will need to use this medicine for 2 to 6 weeks. This may be longer depending on the condition being treated. You may not see full healing for another 1 to 2 months after you stop using the medicine. This medicine may cause serious skin reactions. They can happen weeks to months after starting the medicine. Contact your healthcare provider right away if you notice fevers or flu-like symptoms with a rash. The rash may be red or purple and then turn into blisters or peeling of the skin. Or, you might notice a red rash with swelling of the face, lips or lymph nodes in your neck or under your arms. Treated areas of skin can look unsightly during and for several weeks after treatment with this medicine. This medicine can make you more sensitive to the sun. Keep out of the sun. If you  cannot avoid being in the sun, wear protective clothing and use sunscreen. Do not use sun lamps or tanning beds/booths. Serious side effects or death can occur if a pet comes into contact with this drug. Contact a vet right away if a pet touches or licks the drug on your skin or comes into contact with the container. Throw away or wash any items used to apply this drug. Wash your hands after applying the drug. Make sure the drug does not get on clothing, carpet, or furniture. If you cannot avoid skin to skin contact with your pet, ask your health care provider if you can cover the area(s) where you apply this drug. Do not become pregnant while taking this medicine. Women should inform their doctor if they wish to become pregnant or think they might be pregnant. There is a potential for serious side effects to an unborn  child. Talk to your healthcare provider or pharmacist for more information. What side effects may I notice from receiving this medicine? Side effects that you should report to your doctor or health care professional as soon as possible:  allergic reactions like skin rash, itching or hives, swelling of the face, lips, or tongue  black or bloody stools, blood in the urine or vomit  blurred vision  chest pain  difficulty breathing or wheezing  rash, fever, and swollen lymph nodes  redness, blistering, peeling or loosening of the skin, including inside the mouth  severe redness and swelling of normal skin  slurred speech or weakness on one side of the body  trouble passing urine or change in the amount of urine  unexplained weight gain or swelling  unusually weak or tired  yellowing of eyes or skin Side effects that usually do not require medical attention (report to your doctor or health care professional if they continue or are bothersome):  increased sensitivity of the skin to sun and ultraviolet light  pain and burning of the affected area  scaling or swelling of  the affected area  skin rash, itching of the affected area  tenderness This list may not describe all possible side effects. Call your doctor for medical advice about side effects. You may report side effects to FDA at 1-800-FDA-1088. Where should I keep my medicine? Keep out of the reach of children and pets. Store at room temperature between 20 and 25 degrees C (68 and 77 degrees F). Throw away any unused medicine after the expiration date. NOTE: This sheet is a summary. It may not cover all possible information. If you have questions about this medicine, talk to your doctor, pharmacist, or health care provider.  2021 Elsevier/Gold Standard (2019-11-07 14:47:16) etion.      For prescription refill requests, have your pharmacy contact our office and allow 72 hours for refills to be completed.    Today you received the following chemotherapy and/or immunotherapy agents 5FU and Mvasi      To help prevent nausea and vomiting after your treatment, we encourage you to take your nausea medication as directed.  BELOW ARE SYMPTOMS THAT SHOULD BE REPORTED IMMEDIATELY: . *FEVER GREATER THAN 100.4 F (38 C) OR HIGHER . *CHILLS OR SWEATING . *NAUSEA AND VOMITING THAT IS NOT CONTROLLED WITH YOUR NAUSEA MEDICATION . *UNUSUAL SHORTNESS OF BREATH . *UNUSUAL BRUISING OR BLEEDING . *URINARY PROBLEMS (pain or burning when urinating, or frequent urination) . *BOWEL PROBLEMS (unusual diarrhea, constipation, pain near the anus) . TENDERNESS IN MOUTH AND THROAT WITH OR WITHOUT PRESENCE OF ULCERS (sore throat, sores in mouth, or a toothache) . UNUSUAL RASH, SWELLING OR PAIN  . UNUSUAL VAGINAL DISCHARGE OR ITCHING   Items with * indicate a potential emergency and should be followed up as soon as possible or go to the Emergency Department if any problems should occur.  Please show the CHEMOTHERAPY ALERT CARD or IMMUNOTHERAPY ALERT CARD at check-in to the Emergency Department and triage nurse.  Should you  have questions after your visit or need to cancel or reschedule your appointment, please contact [facility]  [phone] and follow the prompts.  Office hours are 8:00 a.m. to 4:30 p.m. Monday - Friday. Please note that voicemails left after 4:00 p.m. may not be returned until the following business day.  We are closed weekends and major holidays. You have access to a nurse at all times for urgent questions. Please call the  main number to the clinic [phone] and follow the prompts.  For any non-urgent questions, you may also contact your provider using MyChart. We now offer e-Visits for anyone 32 and older to request care online for non-urgent symptoms. For details visit [email or web address].   Also download the MyChart app! Go to the app store, search "MyChart", open the app, select [facility], and log in with your MyChart username and password.  Due to Covid, a mask is required upon entering the hospital/clinic. If you do not have a mask, one will be given to you upon arrival. For doctor visits, patients may have 1 support person aged 42 or older with them. For treatment visits, patients cannot have anyone with them due to current Covid guidelines and our immunocompromised population.

## 2021-04-06 NOTE — Progress Notes (Addendum)
Plt count 95, per Dr [name]- ok to proceed with tx today

## 2021-04-06 NOTE — Progress Notes (Signed)
Hematology/Oncology Consult note [facility]  [phone] [phone]  Patient Care Team: Jodi Marble, MD as PCP - General (Internal Medicine) Clent Jacks, RN as Oncology Nurse Navigator Sindy Guadeloupe, MD as Consulting Physician (Hematology and Oncology)   Name of the patient: Eric Simon  [id number]  01/23/1951   Date of visit: 04/06/21  Diagnosis- metastatic colon cancer with lymph node metastases K-ras/BRAF wild-type  Chief complaint/ Reason for visit-on treatment assessment prior to cycle 34 of 5-FU Mvasi chemotherapy  Heme/Onc history: Patient is a70yrold male with >40 pack year history of smoking. He currently smokes 0.5ppd. he presented to the ER with symptoms of left sided chest pain and left arm pain. Troponin was negative ekg was unremarkable. Ct chest showed no PE. He was incidentally noted to have mediastinal and retrocrural adenopathy and a 2.1X2.3X4.4 cm retroaortic soft tissue lesion in the posterior left chest. 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 Low-grade activity in the left lateral fifth and sixth ribs associated with nondisplaced fractures likely reflecting healing response problem malignancy.  Patient underwent CT-guided biopsy of the retroperitoneal lymph node pathology showed metastatic adenocarcinoma compatible with colorectal origin. CK7 negative. CK20 positive. CDX 2+. TTF-1 negative. PSA negative. This pattern of immunoreactivity supports the above diagnosis. Patient underwent colonoscopy which showed sigmoid mass that was consistent with adenocarcinoma. RASpanel testing showed that he was wild-type for both K-ras and BRAF  FOLFOX and bevacizumab chemotherapy started in July 2020. Subsequently oxaliplatin has been on hold given neuropathy.  Interval  history-patient reports that he had some bleeding in his stool over 3 weeks ago but none recently.  He has been given different bowel medications by GI but he does not like to use them every day and uses them as needed when his constipation gets worse.His last bowel movement was about 2 days ago.  Otherwise doing well on chemotherapy and denies any complaints at this time.  Peripheral neuropathy has been stable  ECOG PS- 1 Pain scale- 0   Review of systems- Review of Systems  Constitutional: Positive for malaise/fatigue. Negative for chills, fever and weight loss.  HENT: Negative for congestion, ear discharge and nosebleeds.   Eyes: Negative for blurred vision.  Respiratory: Negative for cough, hemoptysis, sputum production, shortness of breath and wheezing.   Cardiovascular: Negative for chest pain, palpitations, orthopnea and claudication.  Gastrointestinal: Negative for abdominal pain, blood in stool, constipation, diarrhea, heartburn, melena, nausea and vomiting.  Genitourinary: Negative for dysuria, flank pain, frequency, hematuria and urgency.  Musculoskeletal: Negative for back pain, joint pain and myalgias.  Skin: Negative for rash.  Neurological: Positive for sensory change (Peripheral neuropathy). Negative for dizziness, tingling, focal weakness, seizures, weakness and headaches.  Endo/Heme/Allergies: Does not bruise/bleed easily.  Psychiatric/Behavioral: Negative for depression and suicidal ideas. The patient does not have insomnia.      No Known Allergies   Past Medical History:  Diagnosis Date  . [place] cancer ([place]   . COPD (chronic obstructive pulmonary disease) ([place]   . Hyperlipidemia      Past Surgical History:  Procedure Laterality Date  . COLONOSCOPY WITH PROPOFOL N/A 06/26/2019   Procedure: COLONOSCOPY WITH PROPOFOL;  Surgeon: AJonathon Bellows MD;  [facility];  Service: Gastroenterology;  Laterality: N/A;  . PORTA CATH INSERTION N/A 06/25/2019    Procedure: PORTA CATH INSERTION;  Surgeon: DAlgernon Huxley MD;  Location: [place] [facility];  Service: Cardiovascular;  Laterality: N/A;  . PORTA CATH INSERTION Left 08/11/2020   Procedure: PORTA CATH INSERTION;  Surgeon: Algernon Huxley, MD;  Location: [place] [facility];  Service: Cardiovascular;  Laterality: Left;  . PORTA CATH REMOVAL Right 08/11/2020   Procedure: PORTA CATH REMOVAL;  Surgeon: Algernon Huxley, MD;  Location: [place] [facility];  Service: Cardiovascular;  Laterality: Right;    Social History   Socioeconomic History  . Marital status: Single    Spouse name: Not on file  . Number of children: Not on file  . Years of education: Not on file  . Highest education level: Not on file  Occupational History  . Not on file  Tobacco Use  . Smoking status: Current Every Day Smoker    Packs/day: 0.50    Years: 40.00    Pack years: 20.00    Types: Cigarettes  . Smokeless tobacco: Never Used  . Tobacco comment: smoking 40 years  Vaping Use  . Vaping Use: Never used  Substance and Sexual Activity  . Alcohol use: No  . Drug use: No  . Sexual activity: Not Currently  Other Topics Concern  . Not on file  Social History Narrative  . Not on file   Social Determinants of Health   Financial Resource Strain: Not on file  Food Insecurity: Not on file  Transportation Needs: Not on file  Physical Activity: Not on file  Stress: Not on file  Social Connections: Not on file  Intimate Partner Violence: Not on file    Family History  Problem Relation Age of Onset  . Brain cancer Father      Current Outpatient Medications:  .  aspirin EC 81 MG tablet, Take 81 mg by mouth daily., Disp: , Rfl:  .  azelastine (ASTELIN) 0.1 % nasal spray, Place 1 spray into both nostrils 1 day or 1 dose., Disp: , Rfl:  .  butalbital-acetaminophen-caffeine (FIORICET) 50-325-40 MG tablet, Take 1 tablet by mouth every 4 (four) hours as needed., Disp: , Rfl:  .  cetirizine (ZYRTEC) 10 MG tablet, Take  1 tablet (10 mg total) by mouth daily., Disp: 30 tablet, Rfl: 0 .  chlorthalidone (HYGROTON) 25 MG tablet, Take 1 tablet by mouth daily., Disp: , Rfl:  .  dexamethasone (DECADRON) 4 MG tablet, Take 2 tablets (8 mg total) by mouth daily. Start the day after chemotherapy for 2 days. Take with food., Disp: 30 tablet, Rfl: 1 .  DEXILANT 60 MG capsule, Take 1 capsule by mouth 1 day or 1 dose., Disp: , Rfl:  .  DULoxetine (CYMBALTA) 30 MG capsule, Take 1 capsule (30 mg total) by mouth 2 (two) times daily., Disp: 60 capsule, Rfl: 1 .  fluticasone (FLONASE) 50 MCG/ACT nasal spray, Place 2 sprays into both nostrils 1 day or 1 dose. , Disp: , Rfl:  .  fluticasone (VERAMYST) 27.5 MCG/SPRAY nasal spray, Place 2 sprays into the nose daily., Disp: , Rfl:  .  lactulose (CHRONULAC) 10 GM/15ML solution, Take 30 mLs (20 g total) by mouth 3 (three) times daily., Disp: 8100 mL, Rfl: 1 .  lidocaine-prilocaine (EMLA) cream, Apply to affected area once, Disp: 30 g, Rfl: 3 .  magic mouthwash w/lidocaine SOLN, Take 5-10 mLs by mouth 4 (four) times daily as needed for mouth pain., Disp: 480 mL, Rfl: 3 .  montelukast (SINGULAIR) 10 MG tablet, Take 10 mg by mouth at bedtime., Disp: , Rfl:  .  pantoprazole (  PROTONIX) 40 MG tablet, Take 40 mg by mouth daily. , Disp: , Rfl:  .  potassium chloride SA (K-DUR) 20 MEQ tablet, Take 1 tablet (20 mEq total) by mouth daily., Disp: 14 tablet, Rfl: 0 .  pregabalin (LYRICA) 75 MG capsule, Take 1 capsule (75 mg total) by mouth 2 (two) times daily., Disp: 60 capsule, Rfl: 2 .  simvastatin (ZOCOR) 20 MG tablet, Take 20 mg by mouth daily. , Disp: , Rfl:  .  SYMBICORT 80-4.5 MCG/ACT inhaler, Inhale 2 puffs into the lungs 1 day or 1 dose. , Disp: , Rfl:  .  tamsulosin (FLOMAX) 0.4 MG CAPS capsule, Take 0.4 mg by mouth. , Disp: , Rfl:  .  nicotine (NICODERM CQ - DOSED IN MG/24 HOURS) 21 mg/24hr patch, Place 1 patch onto the skin 1 day or 1 dose. (Patient not taking: Reported on 04/06/2021), Disp: ,  Rfl:  .  ondansetron (ZOFRAN) 8 MG tablet, Take 1 tablet (8 mg total) by mouth 2 (two) times daily as needed for refractory nausea / vomiting. Start on day 3 after chemotherapy. (Patient not taking: Reported on 04/06/2021), Disp: 30 tablet, Rfl: 1 .  oxyCODONE (OXY IR/ROXICODONE) 5 MG immediate release tablet, Take 1 tablet (5 mg total) by mouth every 6 (six) hours as needed for severe pain. (Patient not taking: Reported on 04/06/2021), Disp: 30 tablet, Rfl: 0 .  prochlorperazine (COMPAZINE) 10 MG tablet, Take 1 tablet (10 mg total) by mouth every 6 (six) hours as needed (Nausea or vomiting). (Patient not taking: Reported on 04/06/2021), Disp: 30 tablet, Rfl: 1 No current facility-administered medications for this visit.  Facility-Administered Medications Ordered in Other Visits:  .  bevacizumab-awwb (MVASI) 500 mg in sodium chloride 0.9 % 100 mL chemo infusion, 7.5 mg/kg (Treatment Plan Recorded), Intravenous, Once, Sindy Guadeloupe, MD .  dexamethasone (DECADRON) injection 10 mg, 10 mg, Intravenous, Once, Sindy Guadeloupe, MD .  fluorouracil (ADRUCIL) 4,300 mg in sodium chloride 0.9 % 64 mL chemo infusion, 2,400 mg/m2 (Treatment Plan Recorded), Intravenous, 1 day or 1 dose, Sindy Guadeloupe, MD .  fluorouracil (ADRUCIL) chemo injection 700 mg, 400 mg/m2 (Treatment Plan Recorded), Intravenous, Once, Sindy Guadeloupe, MD .  leucovorin 700 mg in sodium chloride 0.9 % 250 mL infusion, 700 mg, Intravenous, Once, Randa Evens C, MD .  sodium chloride flush (NS) 0.9 % injection 10 mL, 10 mL, Intracatheter, PRN, Sindy Guadeloupe, MD, 10 mL at 08/15/19 1405  Physical exam:  Vitals:   04/06/21 0832  BP: 124/75  Pulse: 71  Resp: 16  Temp: 97.6 F (36.4 C)  TempSrc: Tympanic  Weight: 145 lb 12.8 oz (66.1 kg)  Height: _0  (1.702 m)   Physical Exam Constitutional:      General: He is not in acute distress. Cardiovascular:     Rate and Rhythm: Normal rate and regular rhythm.     Heart sounds: Normal heart  sounds.  Pulmonary:     Effort: Pulmonary effort is normal.     Breath sounds: Normal breath sounds.  Skin:    General: Skin is warm and dry.  Neurological:     Mental Status: He is alert and oriented to person, place, and time.      CMP Latest Ref Rng & Units 04/06/2021  Glucose 70 - 99 mg/dL 95  BUN 8 - 23 mg/dL 29(H)  Creatinine 0.61 - 1.24 mg/dL 1.05  Sodium 135 - 145 mmol/L 137  Potassium 3.5 - 5.1 mmol/L 3.9  Chloride  98 - 111 mmol/L 103  CO2 22 - 32 mmol/L 24  Calcium 8.9 - 10.3 mg/dL 8.9  Total Protein 6.5 - 8.1 g/dL 7.3  Total Bilirubin 0.3 - 1.2 mg/dL 0.6  Alkaline Phos 38 - 126 U/L 120  AST 15 - 41 U/L 40  ALT 0 - 44 U/L 23   CBC Latest Ref Rng & Units 04/06/2021  WBC 4.0 - 10.5 K/uL 9.3  Hemoglobin 13.0 - 17.0 g/dL 12.6(L)  Hematocrit 39.0 - 52.0 % 37.9(L)  Platelets 150 - 400 K/uL 95(L)    No images are attached to the encounter.  CT CHEST ABDOMEN PELVIS W CONTRAST  Result Date: 04/01/2021 CLINICAL DATA:  Colon cancer with metastatic adenopathy, restaging EXAM: CT CHEST, ABDOMEN, AND PELVIS WITH CONTRAST TECHNIQUE: Multidetector CT imaging of the chest, abdomen and pelvis was performed following the standard protocol during bolus administration of intravenous contrast. CONTRAST:  37m OMNIPAQUE IOHEXOL 300 MG/ML  SOLN COMPARISON:  Multiple priors including most recent CT chest abdomen pelvis December 16, 2020. FINDINGS: CT CHEST FINDINGS Cardiovascular: Left chest Port-A-Cath with tip in the right atrium. Aortic and branch vessel atherosclerosis. No thoracic aortic aneurysm. No central pulmonary embolus. Normal size heart. No significant pericardial effusion/thickening. Mediastinum/Nodes: No discrete thyroid nodularity. No pathologically enlarged mediastinal, hilar or axillary lymph nodes. Trachea esophagus are grossly unremarkable. Lungs/Pleura: Moderate to severe centrilobular emphysema. Dependent bibasilar atelectasis versus scarring. Similar biapical  pleuroparenchymal scarring. No suspicious pulmonary nodules or masses. No pleural effusion. No pneumothorax. Musculoskeletal: No chest wall mass or suspicious bone lesions identified. CT ABDOMEN PELVIS FINDINGS Hepatobiliary: No suspicious hepatic lesions. Gallbladder is predominantly decompressed otherwise unremarkable. No biliary ductal dilation. Pancreas: Within normal limits. Spleen: Unchanged splenomegaly measuring 16.6 cm in maximum craniocaudal dimension. Adrenals/Urinary Tract: Adrenal glands are unremarkable. Kidneys are normal, without renal calculi, solid lesion, or hydronephrosis. Bladder is unremarkable for degree of distension. Stomach/Bowel: Enteric contrast traverses the cecum. No suspicious small bowel wall thickening or dilation. Appendix and terminal ileum are grossly unremarkable. Similar mild circumferential wall thickening of the rectum on image 112/2 with adjacent inflammatory stranding mesorectal fat. Vascular/Lymphatic: Aortic atherosclerosis. No abdominal aortic aneurysm. Slightly increased size of the enlarged left iliac and pelvic sidewall lymph nodes, with the indexed lymph node measuring 1.5 x 1.3 cm cm on image 107/2 previously measuring 1.4 x 1.1 cm. Increased size of the left common iliac lymph node now measuring 1.2 x 1.0 cm on image 87/2 previously 0.9 x 0.9 cm. Unchanged size of the subcentimeter left retroperitoneal periaortic lymph node measuring 0.7 x 0.7 cm on image 71/2. Reproductive: Mild prostatomegaly. Other: No abdominopelvic ascites. No discrete omental or mesenteric nodularity. Musculoskeletal: No aggressive lytic or blastic lesion of bone. IMPRESSION: 1. Slightly increased size of the left iliac and pelvic sidewall lymph nodes with similar size of the subcentimeter left periaortic lymph node, concerning for worsening nodal metastatic disease. 2. Similar mild circumferential wall thickening of the rectum with adjacent inflammatory stranding mesorectal fat, in keeping  with known primary malignancy. 3. No evidence of distant metastatic disease within the thorax or solid organ metastatic disease in the abdomen. 4. Unchanged splenomegaly of unknown clinical significance. 5. Emphysema and aortic atherosclerosis. Aortic Atherosclerosis (ICD10-I70.0) and Emphysema (ICD10-J43.9). Electronically Signed   By: JDahlia BailiffMD   On: 04/01/2021 16:22     Assessment and plan- Patient is a 70y.o. male with metastatic colon cancer here for on treatment assessment prior to cycle 34 of 5-FU Mvasi chemotherapy  I have reviewed  CT chest abdomen and pelvis images independently and discussed findings with the patient.That continues to be a slow growth in his intra-abdominal lymph nodes as compared to prior scan in April 2022.  In fact he has had mild increase in his intra-abdominal lymph nodes since May 2021.  Lymph nodes are slowly grown from 5 mm previously to presently at 1.5 cm.  But we are not seeing new areas of adenopathy or other areas of distant metastatic disease.  At presentation patient also had mediastinal adenopathy which has resolved.  Given the slow growth in his disease pattern I am inclined to monitor this conservatively without adding irinotecan to his existing regimen.  Moreover patient's platelet counts are slowly drifting down possibly secondary to splenomegaly which she did not have at presentation.  Oxaliplatin which she has received in the past may have contributed to splenomegaly.  Given that his platelet counts are 95 I would not like to proceed with irinotecan addition at this time.  It would be okay to proceed with 5-FU Mvasi chemotherapy today.  Pump DC on day 3 and receives [place].  I will see him back in 3 weeks for cycle 25.  Constipation: He follows up with GI for this.  He previously reported some rectal bleeding which has resolved in the last 3 to 4 weeks.   Visit Diagnosis 1. Encounter for antineoplastic chemotherapy   2. Encounter for monoclonal  antibody treatment for malignancy   3. 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 ([place])      Dr. Randa Evens, MD, MPH [facility] at [facility] [phone] 04/06/2021 9:12 AM

## 2021-04-06 NOTE — Progress Notes (Addendum)
Pt received 5FU/Mvasi infusion in clinic today. Tolerated well. Adrucil pump connected at d/c.

## 2021-04-06 NOTE — Progress Notes (Signed)
Pt has no concerns, eating and drinking good

## 2021-04-08 ENCOUNTER — Inpatient Hospital Stay: Payer: Medicare HMO

## 2021-04-08 VITALS — BP 127/76 | HR 69 | Temp 97.3°F | Resp 16

## 2021-04-08 DIAGNOSIS — C189 Malignant neoplasm of colon, unspecified: Secondary | ICD-10-CM

## 2021-04-08 DIAGNOSIS — Z5112 Encounter for antineoplastic immunotherapy: Secondary | ICD-10-CM | POA: Diagnosis not present

## 2021-04-08 MED ORDER — PEGFILGRASTIM-CBQV 6 MG/0.6ML ~~LOC~~ SOSY
6.0000 mg | PREFILLED_SYRINGE | Freq: Once | SUBCUTANEOUS | Status: AC
  Administered 2021-04-08: 6 mg via SUBCUTANEOUS
  Filled 2021-04-08: qty 0.6

## 2021-04-08 MED ORDER — HEPARIN SOD (PORK) LOCK FLUSH 100 UNIT/ML IV SOLN
500.0000 [IU] | Freq: Once | INTRAVENOUS | Status: AC | PRN
  Administered 2021-04-08: 500 [IU]
  Filled 2021-04-08: qty 5

## 2021-04-08 MED ORDER — HEPARIN SOD (PORK) LOCK FLUSH 100 UNIT/ML IV SOLN
INTRAVENOUS | Status: AC
  Filled 2021-04-08: qty 5

## 2021-04-08 MED ORDER — SODIUM CHLORIDE 0.9% FLUSH
10.0000 mL | INTRAVENOUS | Status: DC | PRN
  Administered 2021-04-08: 10 mL
  Filled 2021-04-08: qty 10

## 2021-04-08 NOTE — Progress Notes (Signed)
Pt report that he tolerated his infusion well at home.  Pump d/c'ed per policy.  Brisk blood return noted from [facility] prior to deaccess.  Undenyca administered to right arm as ordered, site benign.  Pt left infusion suite stable and ambulatory.

## 2021-04-09 ENCOUNTER — Inpatient Hospital Stay (HOSPITAL_BASED_OUTPATIENT_CLINIC_OR_DEPARTMENT_OTHER): Payer: Medicare HMO | Admitting: Hospice and Palliative Medicine

## 2021-04-09 ENCOUNTER — Other Ambulatory Visit: Payer: Self-pay

## 2021-04-09 DIAGNOSIS — Z515 Encounter for palliative care: Secondary | ICD-10-CM

## 2021-04-09 NOTE — Progress Notes (Signed)
Unable to reach patient for scheduled MyChart visit.  Voicemail left.  Will reschedule. 

## 2021-04-17 ENCOUNTER — Other Ambulatory Visit: Payer: Self-pay | Admitting: *Deleted

## 2021-04-17 MED ORDER — PREGABALIN 75 MG PO CAPS: 75.0000 mg | ORAL_CAPSULE | Freq: Two times a day (BID) | ORAL | 2 refills | Status: DC

## 2021-04-27 ENCOUNTER — Inpatient Hospital Stay: Payer: Medicare HMO | Attending: Oncology

## 2021-04-27 ENCOUNTER — Inpatient Hospital Stay: Payer: Medicare HMO

## 2021-04-27 ENCOUNTER — Inpatient Hospital Stay (HOSPITAL_BASED_OUTPATIENT_CLINIC_OR_DEPARTMENT_OTHER): Payer: Medicare HMO | Admitting: Oncology

## 2021-04-27 ENCOUNTER — Encounter: Payer: Self-pay | Admitting: Oncology

## 2021-04-27 VITALS — BP 126/72 | HR 74 | Temp 98.6°F | Resp 16 | Ht 67.0 in | Wt 143.5 lb

## 2021-04-27 DIAGNOSIS — Z5111 Encounter for antineoplastic chemotherapy: Secondary | ICD-10-CM | POA: Insufficient documentation

## 2021-04-27 DIAGNOSIS — K625 Hemorrhage of anus and rectum: Secondary | ICD-10-CM | POA: Diagnosis not present

## 2021-04-27 DIAGNOSIS — G620 Drug-induced polyneuropathy: Secondary | ICD-10-CM | POA: Insufficient documentation

## 2021-04-27 DIAGNOSIS — T451X5A Adverse effect of antineoplastic and immunosuppressive drugs, initial encounter: Secondary | ICD-10-CM

## 2021-04-27 DIAGNOSIS — Z5189 Encounter for other specified aftercare: Secondary | ICD-10-CM | POA: Insufficient documentation

## 2021-04-27 DIAGNOSIS — C772 Secondary and unspecified malignant neoplasm of intra-abdominal lymph nodes: Secondary | ICD-10-CM | POA: Diagnosis not present

## 2021-04-27 DIAGNOSIS — Z5112 Encounter for antineoplastic immunotherapy: Secondary | ICD-10-CM | POA: Insufficient documentation

## 2021-04-27 DIAGNOSIS — C189 Malignant neoplasm of colon, unspecified: Secondary | ICD-10-CM

## 2021-04-27 DIAGNOSIS — Z452 Encounter for adjustment and management of vascular access device: Secondary | ICD-10-CM | POA: Insufficient documentation

## 2021-04-27 DIAGNOSIS — C187 Malignant neoplasm of sigmoid colon: Secondary | ICD-10-CM | POA: Insufficient documentation

## 2021-04-27 DIAGNOSIS — C786 Secondary malignant neoplasm of retroperitoneum and peritoneum: Secondary | ICD-10-CM | POA: Insufficient documentation

## 2021-04-27 LAB — CBC WITH DIFFERENTIAL/PLATELET
Abs Immature Granulocytes: 0.03 10*3/uL (ref 0.00–0.07)
Basophils Absolute: 0.1 10*3/uL (ref 0.0–0.1)
Basophils Relative: 1 %
Eosinophils Absolute: 0.1 10*3/uL (ref 0.0–0.5)
Eosinophils Relative: 1 %
HCT: 37.3 % — ABNORMAL LOW (ref 39.0–52.0)
Hemoglobin: 12.3 g/dL — ABNORMAL LOW (ref 13.0–17.0)
Immature Granulocytes: 0 %
Lymphocytes Relative: 17 %
Lymphs Abs: 1.8 10*3/uL (ref 0.7–4.0)
MCH: 31.5 pg (ref 26.0–34.0)
MCHC: 33 g/dL (ref 30.0–36.0)
MCV: 95.4 fL (ref 80.0–100.0)
Monocytes Absolute: 0.8 10*3/uL (ref 0.1–1.0)
Monocytes Relative: 7 %
Neutro Abs: 7.6 10*3/uL (ref 1.7–7.7)
Neutrophils Relative %: 74 %
Platelets: 100 10*3/uL — ABNORMAL LOW (ref 150–400)
RBC: 3.91 MIL/uL — ABNORMAL LOW (ref 4.22–5.81)
RDW: 16.7 % — ABNORMAL HIGH (ref 11.5–15.5)
WBC: 10.4 10*3/uL (ref 4.0–10.5)
nRBC: 0 % (ref 0.0–0.2)

## 2021-04-27 LAB — COMPREHENSIVE METABOLIC PANEL
ALT: 21 U/L (ref 0–44)
AST: 39 U/L (ref 15–41)
Albumin: 3.6 g/dL (ref 3.5–5.0)
Alkaline Phosphatase: 125 U/L (ref 38–126)
Anion gap: 9 (ref 5–15)
BUN: 23 mg/dL (ref 8–23)
CO2: 23 mmol/L (ref 22–32)
Calcium: 8.7 mg/dL — ABNORMAL LOW (ref 8.9–10.3)
Chloride: 100 mmol/L (ref 98–111)
Creatinine, Ser: 0.85 mg/dL (ref 0.61–1.24)
GFR, Estimated: >60 mL/min (ref >60)
Glucose, Bld: 92 mg/dL (ref 70–99)
Potassium: 4 mmol/L (ref 3.5–5.1)
Sodium: 132 mmol/L — ABNORMAL LOW (ref 135–145)
Total Bilirubin: 0.9 mg/dL (ref 0.3–1.2)
Total Protein: 7.2 g/dL (ref 6.5–8.1)

## 2021-04-27 LAB — PROTEIN, URINE, RANDOM: Total Protein, Urine: 10 mg/dL

## 2021-04-27 MED ORDER — HEPARIN SOD (PORK) LOCK FLUSH 100 UNIT/ML IV SOLN
500.0000 [IU] | Freq: Once | INTRAVENOUS | Status: DC | PRN
  Filled 2021-04-27: qty 5

## 2021-04-27 MED ORDER — SODIUM CHLORIDE 0.9% FLUSH
10.0000 mL | INTRAVENOUS | Status: DC | PRN
  Administered 2021-04-27: 10 mL via INTRAVENOUS
  Filled 2021-04-27: qty 10

## 2021-04-27 MED ORDER — SODIUM CHLORIDE 0.9 % IV SOLN
7.5000 mg/kg | Freq: Once | INTRAVENOUS | Status: AC
  Administered 2021-04-27: 500 mg via INTRAVENOUS
  Filled 2021-04-27: qty 16

## 2021-04-27 MED ORDER — SODIUM CHLORIDE 0.9 % IV SOLN
INTRAVENOUS | Status: DC
  Filled 2021-04-27: qty 250

## 2021-04-27 MED ORDER — FLUOROURACIL CHEMO INJECTION 2.5 GM/50ML
400.0000 mg/m2 | Freq: Once | INTRAVENOUS | Status: AC
  Administered 2021-04-27: 700 mg via INTRAVENOUS
  Filled 2021-04-27: qty 14

## 2021-04-27 MED ORDER — SODIUM CHLORIDE 0.9 % IV SOLN
2400.0000 mg/m2 | INTRAVENOUS | Status: DC
  Administered 2021-04-27: 4300 mg via INTRAVENOUS
  Filled 2021-04-27: qty 86

## 2021-04-27 MED ORDER — HEPARIN SOD (PORK) LOCK FLUSH 100 UNIT/ML IV SOLN
500.0000 [IU] | Freq: Once | INTRAVENOUS | Status: DC
Start: 2021-04-27 — End: 2021-04-27
  Filled 2021-04-27: qty 5

## 2021-04-27 MED ORDER — SODIUM CHLORIDE 0.9 % IV SOLN
700.0000 mg | Freq: Once | INTRAVENOUS | Status: AC
  Administered 2021-04-27: 700 mg via INTRAVENOUS
  Filled 2021-04-27: qty 35

## 2021-04-27 MED ORDER — DEXAMETHASONE SODIUM PHOSPHATE 10 MG/ML IJ SOLN
10.0000 mg | Freq: Once | INTRAMUSCULAR | Status: AC
  Administered 2021-04-27: 10 mg via INTRAVENOUS
  Filled 2021-04-27: qty 1

## 2021-04-27 NOTE — Progress Notes (Signed)
Hematology/Oncology Consult note [facility]  [phone] [phone]  Patient Care Team: Jodi Marble, MD as PCP - General (Internal Medicine) Clent Jacks, RN as Oncology Nurse Navigator Sindy Guadeloupe, MD as Consulting Physician (Hematology and Oncology)   Name of the patient: Eric Simon  [id number]  14-Jan-1951   Date of visit: 04/27/21  Diagnosis- metastatic colon cancer with lymph node metastases K-ras/BRAF wild-type  Chief complaint/ Reason for visit-on treatment assessment prior to cycle 25 of 5-FU Mvasi chemotherapy  Heme/Onc history: Patient is a70yrold male with >40 pack year history of smoking. He currently smokes 0.5ppd. he presented to the ER with symptoms of left sided chest pain and left arm pain. Troponin was negative ekg was unremarkable. Ct chest showed no PE. He was incidentally noted to have mediastinal and retrocrural adenopathy and a 2.1X2.3X4.4 cm retroaortic soft tissue lesion in the posterior left chest. 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 Low-grade activity in the left lateral fifth and sixth ribs associated with nondisplaced fractures likely reflecting healing response problem malignancy.  Patient underwent CT-guided biopsy of the retroperitoneal lymph node pathology showed metastatic adenocarcinoma compatible with colorectal origin. CK7 negative. CK20 positive. CDX 2+. TTF-1 negative. PSA negative. This pattern of immunoreactivity supports the above diagnosis. Patient underwent colonoscopy which showed sigmoid mass that was consistent with adenocarcinoma. RASpanel testing showed that he was wild-type for both K-ras and BRAF  FOLFOX and bevacizumab chemotherapy started in July 2020. Subsequently oxaliplatin has been on hold given neuropathy.  Recent scans have shown slow  increase in the size of intra-abdominal adenopathy but patient continues to have low-volume disease.Irinotecan has not been added to the regimen yet.   Interval history-reports occasional rectal bleeding.  Denies other complaints at this time.  Bowel movements are now regular.  Denies any significant pain.  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 Negative for chills, fever and weight loss.  HENT: Negative for congestion, ear discharge and nosebleeds.   Eyes: Negative for blurred vision.  Respiratory: Negative for cough, hemoptysis, sputum production, shortness of breath and wheezing.   Cardiovascular: Negative for chest pain, palpitations, orthopnea and claudication.  Gastrointestinal: Positive for blood in stool. Negative for abdominal pain, constipation, diarrhea, heartburn, melena, nausea and vomiting.  Genitourinary: Negative for dysuria, flank pain, frequency, hematuria and urgency.  Musculoskeletal: Negative for back pain, joint pain and myalgias.  Skin: Negative for rash.  Neurological: Positive for sensory change (Peripheral neuropathy). Negative for dizziness, tingling, focal weakness, seizures, weakness and headaches.  Endo/Heme/Allergies: Does not bruise/bleed easily.  Psychiatric/Behavioral: Negative for depression and suicidal ideas. The patient does not have insomnia.      No Known Allergies   Past Medical History:  Diagnosis Date  . [place]   . COPD [place] obstructive pulmonary disease) [place]   . Hyperlipidemia      Past Surgical History:  Procedure Laterality Date  . COLONOSCOPY WITH PROPOFOL N/A 06/26/2019   Procedure: COLONOSCOPY WITH PROPOFOL;  Surgeon: AJonathon Bellows MD;  [facility];  Service: Gastroenterology;  Laterality: N/A;  . PORTA CATH INSERTION N/A 06/25/2019    Procedure: PORTA CATH INSERTION;  Surgeon: DAlgernon Huxley MD;  [facility];  Service: Cardiovascular;  Laterality: N/A;  . PORTA CATH INSERTION Left 08/11/2020   Procedure: PORTA CATH INSERTION;  Surgeon: Algernon Huxley, MD;  Location: [place] [facility];  Service: Cardiovascular;  Laterality: Left;  . PORTA CATH REMOVAL Right 08/11/2020   Procedure: PORTA CATH REMOVAL;  Surgeon: Algernon Huxley, MD;  Location: [place] CV LAB;  Service: Cardiovascular;  Laterality: Right;    Social History   Socioeconomic History  . Marital status: Single    Spouse name: Not on file  . Number of children: Not on file  . Years of education: Not on file  . Highest education level: Not on file  Occupational History  . Not on file  Tobacco Use  . Smoking status: Current Every Day Smoker    Packs/day: 0.50    Years: 40.00    Pack years: 20.00    Types: Cigarettes  . Smokeless tobacco: Never Used  . Tobacco comment: smoking 40 years  Vaping Use  . Vaping Use: Never used  Substance and Sexual Activity  . Alcohol use: No  . Drug use: No  . Sexual activity: Not Currently  Other Topics Concern  . Not on file  Social History Narrative  . Not on file   Social Determinants of Health   Financial Resource Strain: Not on file  Food Insecurity: Not on file  Transportation Needs: Not on file  Physical Activity: Not on file  Stress: Not on file  Social Connections: Not on file  Intimate Partner Violence: Not on file    Family History  Problem Relation Age of Onset  . Brain cancer Father      Current Outpatient Medications:  .  aspirin EC 81 MG tablet, Take 81 mg by mouth daily., Disp: , Rfl:  .  azelastine (ASTELIN) 0.1 % nasal spray, Place 1 spray into both nostrils 1 day or 1 dose., Disp: , Rfl:  .  butalbital-acetaminophen-caffeine (FIORICET) 50-325-40 MG tablet, Take 1 tablet by mouth every 4 (four) hours as needed., Disp: , Rfl:  .  cetirizine (ZYRTEC) 10 MG tablet, Take  1 tablet (10 mg total) by mouth daily., Disp: 30 tablet, Rfl: 0 .  chlorthalidone (HYGROTON) 25 MG tablet, Take 1 tablet by mouth daily., Disp: , Rfl:  .  dexamethasone (DECADRON) 4 MG tablet, Take 2 tablets (8 mg total) by mouth daily. Start the day after chemotherapy for 2 days. Take with food., Disp: 30 tablet, Rfl: 1 .  DEXILANT 60 MG capsule, Take 1 capsule by mouth 1 day or 1 dose., Disp: , Rfl:  .  DULoxetine (CYMBALTA) 30 MG capsule, Take 1 capsule (30 mg total) by mouth 2 (two) times daily., Disp: 60 capsule, Rfl: 1 .  fluticasone (FLONASE) 50 MCG/ACT nasal spray, Place 2 sprays into both nostrils 1 day or 1 dose. , Disp: , Rfl:  .  fluticasone (VERAMYST) 27.5 MCG/SPRAY nasal spray, Place 2 sprays into the nose daily., Disp: , Rfl:  .  lactulose (CHRONULAC) 10 GM/15ML solution, Take 30 mLs (20 g total) by mouth 3 (three) times daily., Disp: 8100 mL, Rfl: 1 .  lidocaine-prilocaine (EMLA) cream, Apply to affected area once, Disp: 30 g, Rfl: 3 .  magic mouthwash w/lidocaine SOLN, Take 5-10 mLs by mouth 4 (four) times daily as needed for mouth pain., Disp: 480 mL, Rfl: 3 .  montelukast (SINGULAIR) 10 MG tablet, Take 10 mg by mouth at bedtime., Disp: , Rfl:  .  nicotine (NICODERM CQ - DOSED IN MG/24 HOURS) 21 mg/24hr  patch, Place 1 patch onto the skin 1 day or 1 dose. (Patient not taking: Reported on 04/06/2021), Disp: , Rfl:  .  ondansetron (ZOFRAN) 8 MG tablet, Take 1 tablet (8 mg total) by mouth 2 (two) times daily as needed for refractory nausea / vomiting. Start on day 3 after chemotherapy. (Patient not taking: Reported on 04/06/2021), Disp: 30 tablet, Rfl: 1 .  oxyCODONE (OXY IR/ROXICODONE) 5 MG immediate release tablet, Take 1 tablet (5 mg total) by mouth every 6 (six) hours as needed for severe pain. (Patient not taking: Reported on 04/06/2021), Disp: 30 tablet, Rfl: 0 .  pantoprazole (PROTONIX) 40 MG tablet, Take 40 mg by mouth daily. , Disp: , Rfl:  .  potassium chloride SA (K-DUR) 20 MEQ  tablet, Take 1 tablet (20 mEq total) by mouth daily., Disp: 14 tablet, Rfl: 0 .  pregabalin (LYRICA) 75 MG capsule, Take 1 capsule (75 mg total) by mouth 2 (two) times daily., Disp: 60 capsule, Rfl: 2 .  prochlorperazine (COMPAZINE) 10 MG tablet, Take 1 tablet (10 mg total) by mouth every 6 (six) hours as needed (Nausea or vomiting). (Patient not taking: Reported on 04/06/2021), Disp: 30 tablet, Rfl: 1 .  simvastatin (ZOCOR) 20 MG tablet, Take 20 mg by mouth daily. , Disp: , Rfl:  .  SYMBICORT 80-4.5 MCG/ACT inhaler, Inhale 2 puffs into the lungs 1 day or 1 dose. , Disp: , Rfl:  .  tamsulosin (FLOMAX) 0.4 MG CAPS capsule, Take 0.4 mg by mouth. , Disp: , Rfl:  No current facility-administered medications for this visit.  Facility-Administered Medications Ordered in Other Visits:  .  heparin lock flush 100 unit/mL, 500 Units, Intravenous, Once, Randa Evens C, MD .  sodium chloride flush (NS) 0.9 % injection 10 mL, 10 mL, Intracatheter, PRN, Sindy Guadeloupe, MD, 10 mL at 08/15/19 1405 .  sodium chloride flush (NS) 0.9 % injection 10 mL, 10 mL, Intravenous, PRN, Sindy Guadeloupe, MD, 10 mL at 04/27/21 1610  Physical exam: There were no vitals filed for this visit. Physical Exam HENT:     Head: Normocephalic and atraumatic.  Eyes:     Pupils: Pupils are equal, round, and reactive to light.  Cardiovascular:     Rate and Rhythm: Normal rate and regular rhythm.     Heart sounds: Normal heart sounds.  Pulmonary:     Effort: Pulmonary effort is normal.     Breath sounds: Normal breath sounds.  Abdominal:     General: Bowel sounds are normal.     Palpations: Abdomen is soft.  Musculoskeletal:     Cervical back: Normal range of motion.  Skin:    General: Skin is warm and dry.  Neurological:     Mental Status: He is alert and oriented to person, place, and time.      CMP Latest Ref Rng & Units 04/06/2021  Glucose 70 - 99 mg/dL 95  BUN 8 - 23 mg/dL 29(H)  Creatinine 0.61 - 1.24 mg/dL 1.05   Sodium 135 - 145 mmol/L 137  Potassium 3.5 - 5.1 mmol/L 3.9  Chloride 98 - 111 mmol/L 103  CO2 22 - 32 mmol/L 24  Calcium 8.9 - 10.3 mg/dL 8.9  Total Protein 6.5 - 8.1 g/dL 7.3  Total Bilirubin 0.3 - 1.2 mg/dL 0.6  Alkaline Phos 38 - 126 U/L 120  AST 15 - 41 U/L 40  ALT 0 - 44 U/L 23   CBC Latest Ref Rng & Units 04/06/2021  WBC 4.0 -  10.5 K/uL 9.3  Hemoglobin 13.0 - 17.0 g/dL 12.6(L)  Hematocrit 39.0 - 52.0 % 37.9(L)  Platelets 150 - 400 K/uL 95(L)    No images are attached to the encounter.  CT CHEST ABDOMEN PELVIS W CONTRAST  Result Date: 04/01/2021 CLINICAL DATA:  Colon cancer with metastatic adenopathy, restaging EXAM: CT CHEST, ABDOMEN, AND PELVIS WITH CONTRAST TECHNIQUE: Multidetector CT imaging of the chest, abdomen and pelvis was performed following the standard protocol during bolus administration of intravenous contrast. CONTRAST:  100m OMNIPAQUE IOHEXOL 300 MG/ML  SOLN COMPARISON:  Multiple priors including most recent CT chest abdomen pelvis December 16, 2020. FINDINGS: CT CHEST FINDINGS Cardiovascular: Left chest Port-A-Cath with tip in the right atrium. Aortic and branch vessel atherosclerosis. No thoracic aortic aneurysm. No central pulmonary embolus. Normal size heart. No significant pericardial effusion/thickening. Mediastinum/Nodes: No discrete thyroid nodularity. No pathologically enlarged mediastinal, hilar or axillary lymph nodes. Trachea esophagus are grossly unremarkable. Lungs/Pleura: Moderate to severe centrilobular emphysema. Dependent bibasilar atelectasis versus scarring. Similar biapical pleuroparenchymal scarring. No suspicious pulmonary nodules or masses. No pleural effusion. No pneumothorax. Musculoskeletal: No chest wall mass or suspicious bone lesions identified. CT ABDOMEN PELVIS FINDINGS Hepatobiliary: No suspicious hepatic lesions. Gallbladder is predominantly decompressed otherwise unremarkable. No biliary ductal dilation. Pancreas: Within normal limits.  Spleen: Unchanged splenomegaly measuring 16.6 cm in maximum craniocaudal dimension. Adrenals/Urinary Tract: Adrenal glands are unremarkable. Kidneys are normal, without renal calculi, solid lesion, or hydronephrosis. Bladder is unremarkable for degree of distension. Stomach/Bowel: Enteric contrast traverses the cecum. No suspicious small bowel wall thickening or dilation. Appendix and terminal ileum are grossly unremarkable. Similar mild circumferential wall thickening of the rectum on image 112/2 with adjacent inflammatory stranding mesorectal fat. Vascular/Lymphatic: Aortic atherosclerosis. No abdominal aortic aneurysm. Slightly increased size of the enlarged left iliac and pelvic sidewall lymph nodes, with the indexed lymph node measuring 1.5 x 1.3 cm cm on image 107/2 previously measuring 1.4 x 1.1 cm. Increased size of the left common iliac lymph node now measuring 1.2 x 1.0 cm on image 87/2 previously 0.9 x 0.9 cm. Unchanged size of the subcentimeter left retroperitoneal periaortic lymph node measuring 0.7 x 0.7 cm on image 71/2. Reproductive: Mild prostatomegaly. Other: No abdominopelvic ascites. No discrete omental or mesenteric nodularity. Musculoskeletal: No aggressive lytic or blastic lesion of bone. IMPRESSION: 1. Slightly increased size of the left iliac and pelvic sidewall lymph nodes with similar size of the subcentimeter left periaortic lymph node, concerning for worsening nodal metastatic disease. 2. Similar mild circumferential wall thickening of the rectum with adjacent inflammatory stranding mesorectal fat, in keeping with known primary malignancy. 3. No evidence of distant metastatic disease within the thorax or solid organ metastatic disease in the abdomen. 4. Unchanged splenomegaly of unknown clinical significance. 5. Emphysema and aortic atherosclerosis. Aortic Atherosclerosis (ICD10-I70.0) and Emphysema (ICD10-J43.9). Electronically Signed   By: JDahlia BailiffMD   On: 04/01/2021 16:22      Assessment and plan- Patient is a 70y.o. male with metastatic colon cancer here for on treatment assessment prior to cycle 35 of 5-FU Mvasi chemotherapy  Counts okay to proceed with cycle 35 of 5-FU Mvasi chemotherapy today.  He will return to clinic in 3 weeks and see covering MD/NP for cycle 36 and I will see him back in 6 weeks for cycle 37.  There has been slow progression in the size of his intra-abdominal adenopathy.  However given the low-volume metastatic disease and to maintain his quality of life I have not added irinotecan to  his regimen yet.  Chemo induced peripheral neuropathy: Continue Cymbalta  Bright red blood per rectum: Possibly secondary to hemorrhoids.  It appears to be hemodynamically insignificant at this time.  He wishes to avoid another endoscopy or colonoscopy at this time.  He will let us know if his bleeding worsens at which point I will send him back to GI   Visit Diagnosis 1. 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 ([place])   2. Encounter for antineoplastic chemotherapy   3. Chemotherapy-induced peripheral neuropathy ([place])      Dr. Randa Evens, MD, MPH [facility] [phone] 04/27/2021 12:22 PM

## 2021-04-27 NOTE — Patient Instructions (Signed)
[facility]ENTER [place] [facility]  Discharge Instructions: Thank you for choosing [facility] to provide your oncology and hematology care.  If you have a lab appointment with the Cancer Center, please go directly to the Cancer Center and check in at the registration area.  Wear comfortable clothing and clothing appropriate for easy access to any Portacath or PICC line.   We strive to give you quality time with your provider. You may need to reschedule your appointment if you arrive late (15 or more minutes).  Arriving late affects you and other patients whose appointments are after yours.  Also, if you miss three or more appointments without notifying the office, you may be dismissed from the clinic at the provider's discretion.      For prescription refill requests, have your pharmacy contact our office and allow 72 hours for refills to be completed.    Today you received the following chemotherapy and/or immunotherapy agents       To help prevent nausea and vomiting after your treatment, we encourage you to take your nausea medication as directed.  BELOW ARE SYMPTOMS THAT SHOULD BE REPORTED IMMEDIATELY: *FEVER GREATER THAN 100.4 F (38 C) OR HIGHER *CHILLS OR SWEATING *NAUSEA AND VOMITING THAT IS NOT CONTROLLED WITH YOUR NAUSEA MEDICATION *UNUSUAL SHORTNESS OF BREATH *UNUSUAL BRUISING OR BLEEDING *URINARY PROBLEMS (pain or burning when urinating, or frequent urination) *BOWEL PROBLEMS (unusual diarrhea, constipation, pain near the anus) TENDERNESS IN MOUTH AND THROAT WITH OR WITHOUT PRESENCE OF ULCERS (sore throat, sores in mouth, or a toothache) UNUSUAL RASH, SWELLING OR PAIN  UNUSUAL VAGINAL DISCHARGE OR ITCHING   Items with * indicate a potential emergency and should be followed up as soon as possible or go to the Emergency Department if any problems should occur.  Please show the CHEMOTHERAPY ALERT CARD or IMMUNOTHERAPY ALERT CARD at check-in to the  Emergency Department and triage nurse.  Should you have questions after your visit or need to cancel or reschedule your appointment, please contact [facility] [place] [facility]  [phone] and follow the prompts.  Office hours are 8:00 a.m. to 4:30 p.m. Monday - Friday. Please note that voicemails left after 4:00 p.m. may not be returned until the following business day.  We are closed weekends and major holidays. You have access to a nurse at all times for urgent questions. Please call the main number to the clinic [phone] and follow the prompts.  For any non-urgent questions, you may also contact your provider using MyChart. We now offer e-Visits for anyone 18 and older to request care online for non-urgent symptoms. For details visit [email or web address].   Also download the MyChart app! Go to the app store, search "MyChart", open the app, select [facility], and log in with your MyChart username and password.  Due to Covid, a mask is required upon entering the hospital/clinic. If you do not have a mask, one will be given to you upon arrival. For doctor visits, patients may have 1 support person aged 18 or older with them. For treatment visits, patients cannot have anyone with them due to current Covid guidelines and our immunocompromised population.  

## 2021-04-27 NOTE — Progress Notes (Signed)
Pt has no concerns. He does say that his air conditioner is out. He is working on trying to find one cheaper thatn 4,000 dollars

## 2021-04-28 LAB — CEA: CEA: 5.7 ng/mL — ABNORMAL HIGH (ref 0.0–4.7)

## 2021-04-29 ENCOUNTER — Inpatient Hospital Stay: Payer: Medicare HMO

## 2021-04-29 DIAGNOSIS — Z5112 Encounter for antineoplastic immunotherapy: Secondary | ICD-10-CM | POA: Diagnosis not present

## 2021-04-29 DIAGNOSIS — C189 Malignant neoplasm of colon, unspecified: Secondary | ICD-10-CM

## 2021-04-29 DIAGNOSIS — C772 Secondary and unspecified malignant neoplasm of intra-abdominal lymph nodes: Secondary | ICD-10-CM

## 2021-04-29 MED ORDER — HEPARIN SOD (PORK) LOCK FLUSH 100 UNIT/ML IV SOLN
500.0000 [IU] | Freq: Once | INTRAVENOUS | Status: AC | PRN
  Administered 2021-04-29: 500 [IU]
  Filled 2021-04-29: qty 5

## 2021-04-29 MED ORDER — HEPARIN SOD (PORK) LOCK FLUSH 100 UNIT/ML IV SOLN
INTRAVENOUS | Status: AC
  Filled 2021-04-29: qty 5

## 2021-04-29 MED ORDER — SODIUM CHLORIDE 0.9% FLUSH
10.0000 mL | INTRAVENOUS | Status: DC | PRN
  Administered 2021-04-29: 10 mL
  Filled 2021-04-29: qty 10

## 2021-04-29 MED ORDER — PEGFILGRASTIM-CBQV 6 MG/0.6ML ~~LOC~~ SOSY
6.0000 mg | PREFILLED_SYRINGE | Freq: Once | SUBCUTANEOUS | Status: AC
  Administered 2021-04-29: 6 mg via SUBCUTANEOUS
  Filled 2021-04-29: qty 0.6

## 2021-04-30 ENCOUNTER — Telehealth: Payer: Self-pay | Admitting: Gastroenterology

## 2021-04-30 NOTE — Telephone Encounter (Signed)
His hb is stable. Last time I asked him about rectal bleeding, he said it was not much and intermittent. I asked him to see you for sigmoidoscopy and he did not want to do it. And this was last week

## 2021-04-30 NOTE — Telephone Encounter (Signed)
Patient called and is have rectal bleeding (0-10 is at a 7). Please advise.

## 2021-05-01 ENCOUNTER — Other Ambulatory Visit: Payer: Self-pay

## 2021-05-01 DIAGNOSIS — K625 Hemorrhage of anus and rectum: Secondary | ICD-10-CM | POA: Insufficient documentation

## 2021-05-01 NOTE — Progress Notes (Signed)
Per Dr. [name], he would like for patient to schedule a Flex sigmoid. Pt has agreed to procedure.

## 2021-05-06 ENCOUNTER — Encounter: Payer: Self-pay | Admitting: Gastroenterology

## 2021-05-07 ENCOUNTER — Ambulatory Visit (INDEPENDENT_AMBULATORY_CARE_PROVIDER_SITE_OTHER): Payer: Medicare HMO | Admitting: Gastroenterology

## 2021-05-07 ENCOUNTER — Ambulatory Visit
Admission: RE | Admit: 2021-05-07 | Discharge: 2021-05-07 | Disposition: A | Discharge status: Home / Self Care | Payer: Medicare HMO | Attending: Gastroenterology | Admitting: Gastroenterology

## 2021-05-07 ENCOUNTER — Other Ambulatory Visit: Payer: Self-pay

## 2021-05-07 ENCOUNTER — Encounter: Payer: Self-pay | Admitting: Gastroenterology

## 2021-05-07 ENCOUNTER — Ambulatory Visit: Payer: Medicare HMO | Admitting: Anesthesiology

## 2021-05-07 ENCOUNTER — Encounter
Admission: RE | Disposition: A | Discharge status: Home / Self Care | Payer: Self-pay | Source: Home / Self Care | Attending: Gastroenterology

## 2021-05-07 DIAGNOSIS — K648 Other hemorrhoids: Secondary | ICD-10-CM

## 2021-05-07 DIAGNOSIS — Z7951 Long term (current) use of inhaled steroids: Secondary | ICD-10-CM | POA: Insufficient documentation

## 2021-05-07 DIAGNOSIS — Z79899 Other long term (current) drug therapy: Secondary | ICD-10-CM | POA: Diagnosis not present

## 2021-05-07 DIAGNOSIS — K641 Second degree hemorrhoids: Secondary | ICD-10-CM | POA: Diagnosis not present

## 2021-05-07 DIAGNOSIS — F17210 Nicotine dependence, cigarettes, uncomplicated: Secondary | ICD-10-CM | POA: Insufficient documentation

## 2021-05-07 DIAGNOSIS — Z7982 Long term (current) use of aspirin: Secondary | ICD-10-CM | POA: Insufficient documentation

## 2021-05-07 DIAGNOSIS — K625 Hemorrhage of anus and rectum: Secondary | ICD-10-CM | POA: Diagnosis present

## 2021-05-07 DIAGNOSIS — K602 Anal fissure, unspecified: Secondary | ICD-10-CM | POA: Diagnosis not present

## 2021-05-07 DIAGNOSIS — J449 Chronic obstructive pulmonary disease, unspecified: Secondary | ICD-10-CM | POA: Insufficient documentation

## 2021-05-07 DIAGNOSIS — Z85038 Personal history of other malignant neoplasm of large intestine: Secondary | ICD-10-CM | POA: Diagnosis not present

## 2021-05-07 DIAGNOSIS — Z808 Family history of malignant neoplasm of other organs or systems: Secondary | ICD-10-CM | POA: Diagnosis not present

## 2021-05-07 HISTORY — PX: FLEXIBLE SIGMOIDOSCOPY: SHX5431

## 2021-05-07 SURGERY — SIGMOIDOSCOPY, FLEXIBLE
Anesthesia: General

## 2021-05-07 MED ORDER — PROPOFOL 10 MG/ML IV BOLUS
INTRAVENOUS | Status: DC | PRN
  Administered 2021-05-07: 20 mg via INTRAVENOUS
  Administered 2021-05-07: 80 mg via INTRAVENOUS
  Administered 2021-05-07: 20 mg via INTRAVENOUS

## 2021-05-07 MED ORDER — SODIUM CHLORIDE 0.9 % IV SOLN
INTRAVENOUS | Status: DC
  Administered 2021-05-07: 1000 mL via INTRAVENOUS

## 2021-05-07 NOTE — Anesthesia Preprocedure Evaluation (Signed)
Anesthesia Evaluation  Patient identified by MRN, date of birth, ID band Patient awake    Reviewed: Allergy & Precautions, NPO status , Patient's Chart, lab work & pertinent test results  History of Anesthesia Complications Negative for: history of anesthetic complications  Airway Mallampati: II  TM Distance: >3 FB Neck ROM: limited    Dental  (+) Missing, Poor Dentition   Pulmonary COPD, Current Smoker and Patient abstained from smoking.,    Pulmonary exam normal        Cardiovascular Exercise Tolerance: Good (-) anginanegative cardio ROS Normal cardiovascular exam     Neuro/Psych negative neurological ROS  negative psych ROS   GI/Hepatic Neg liver ROS,   Endo/Other  negative endocrine ROS  Renal/GU negative Renal ROS  negative genitourinary   Musculoskeletal negative musculoskeletal ROS (+)   Abdominal Normal abdominal exam  (+)   Peds negative pediatric ROS (+)  Hematology negative hematology ROS (+)   Anesthesia Other Findings Past Medical History: No date: COPD (chronic obstructive pulmonary disease) (HCC) No date: Hyperlipidemia  Reproductive/Obstetrics                             Anesthesia Physical  Anesthesia Plan  ASA: III  Anesthesia Plan: General   Post-op Pain Management:    Induction: Intravenous  PONV Risk Score and Plan:   Airway Management Planned: Nasal Cannula  Additional Equipment:   Intra-op Plan:   Post-operative Plan:   Informed Consent: I have reviewed the patients History and Physical, chart, labs and discussed the procedure including the risks, benefits and alternatives for the proposed anesthesia with the patient or authorized representative who has indicated his/her understanding and acceptance.     Dental advisory given  Plan Discussed with: CRNA and Surgeon  Anesthesia Plan Comments: (Patient consented for risks of anesthesia  including but not limited to:  - adverse reactions to medications - risk of airway placement if required - damage to eyes, teeth, lips or other oral mucosa - nerve damage due to positioning  - sore throat or hoarseness - Damage to heart, brain, nerves, lungs, other parts of body or loss of life  Patient voiced understanding.)        Anesthesia Quick Evaluation

## 2021-05-07 NOTE — Progress Notes (Signed)
Patient follow-ups today for banding of hemorrhoids    Summary of history :  He has a history of colorectal cancer undergoing treatment with oncology.  He tolerated recently treated as he is having extensive rectal bleeding after bowel movement.  I performed sigmoidoscopy today and found no blood in the colon nor any colonic mass.  But I did see large internal hemorrhoids which appeared inflamed which could have been the cause of his bleeding.  I advised him to come in today to have them banded.  I attempted to perform a digital rectal exam today at the office he had severe pain in his anus along the posterior wall indicating an anal fissure.  Could not proceed with banding of the hemorrhoids.   Plan:  1.  Anal fissure : avoid constipation.  Commence on MiraLAX 1 capful daily.  We will provide him information to treat anal fissure along with topical ointment to help the anal fissure heal.  Once the anal fissure is healed I can perform banding of the hemorrhoids.  Hopefully the anal fissure heals in 2 to 4 weeks  Follow-up: 2-4 weeks  Dr [name] MD,MRCP [facility]) Gastroenterology/Hepatology Pager: [phone]

## 2021-05-07 NOTE — Transfer of Care (Signed)
Immediate Anesthesia Transfer of Care Note  Patient: Eric Simon  Procedure(s) Performed: FLEXIBLE SIGMOIDOSCOPY (N/A )  Patient Location: PACU and Endoscopy Unit  Anesthesia Type:General  Level of Consciousness: drowsy and patient cooperative  Airway & Oxygen Therapy: Patient Spontanous Breathing  Post-op Assessment: Report given to RN and Post -op Vital signs reviewed and stable  Post vital signs: Reviewed and stable  Last Vitals:  Vitals Value Taken Time  BP 136/71 05/07/21 1110  Temp    Pulse 72 05/07/21 1112  Resp 31 05/07/21 1112  SpO2 97 % 05/07/21 1112  Vitals shown include unvalidated device data.  Last Pain:  Vitals:   05/07/21 0940  TempSrc: Temporal  PainSc: 0-No pain         Complications: No complications documented.

## 2021-05-07 NOTE — Op Note (Signed)
[facility] Patient Name: Eric Simon Procedure Date: 05/07/2021 10:54 AM MRN: [id number] Account #: [id number] Date of Birth: Jun 22, 1951 Admit Type: Outpatient Age: 70 Room: [facility] ENDO ROOM 3 Gender: Male Note Status: Finalized Procedure:             Flexible Sigmoidoscopy Indications:           Rectal hemorrhage Providers:             [name] MD, MD Referring MD:          Venetia Maxon. Elijio Miles, MD (Referring MD) Medicines:             Monitored Anesthesia Care Complications:         No immediate complications. Procedure:             Pre-Anesthesia Assessment:                        - Prior to the procedure, a History and Physical was                         performed, and patient medications, allergies and                         sensitivities were reviewed. The patient's tolerance                         of previous anesthesia was reviewed.                        - The risks and benefits of the procedure and the                         sedation options and risks were discussed with the                         patient. All questions were answered and informed                         consent was obtained.                        - ASA Grade Assessment: III - A patient with severe                         systemic disease.                        After obtaining informed consent, the scope was passed                         under direct vision. The Colonoscope was introduced                         through the anus and advanced to the the left                         transverse colon. The flexible sigmoidoscopy was                         accomplished with ease. The  patient tolerated the                         procedure well. The quality of the bowel preparation                         was good. Findings:      The perianal and digital rectal examinations were normal.      Non-bleeding internal hemorrhoids were found during retroflexion. The        hemorrhoids were large and Grade II (internal hemorrhoids that prolapse       but reduce spontaneously).      The exam was otherwise without abnormality. Impression:            - Non-bleeding internal hemorrhoids.                        - The examination was otherwise normal.                        - No specimens collected. Recommendation:        - Discharge patient to home (with escort).                        - Resume previous diet.                        - Return to my office today.                        - Come to my office today at 3 pm for banding of                         hemorroids Procedure Code(s):     --- Professional ---                        [phone], Sigmoidoscopy, flexible; diagnostic, including                         collection of specimen(s) by brushing or washing, when                         performed (separate procedure) Diagnosis Code(s):     --- Professional ---                        K64.1, Second degree hemorrhoids                        K62.5, Hemorrhage of anus and rectum CPT copyright 2019 American Medical Association. All rights reserved. The codes documented in this report are preliminary and upon coder review may  be revised to meet current compliance requirements. [name], MD [name] MD, MD 05/07/2021 11:10:13 AM This report has been signed electronically. Number of Addenda: 0 Note Initiated On: 05/07/2021 10:54 AM Total Procedure Duration: 0 hours 6 minutes 41 seconds  Estimated Blood Loss:  Estimated blood loss: none.      [facility]

## 2021-05-07 NOTE — H&P (Signed)
[name], MD [address] [place], [place], [place], [address] [address], [place], [place], [place], [address] Phone: [phone]  Fax: [phone]  Primary Care Physician:  Jodi Marble, MD   Pre-Procedure History & Physical: HPI:  Eric Simon is a 70 y.o. male is here for an flexible sigmoidoscopy   Past Medical History:  Diagnosis Date  . Colon cancer ([place])   . COPD (chronic obstructive pulmonary disease) ([place])   . Hyperlipidemia     Past Surgical History:  Procedure Laterality Date  . COLONOSCOPY WITH PROPOFOL N/A 06/26/2019   Procedure: COLONOSCOPY WITH PROPOFOL;  Surgeon: [name], MD;  Location: [facility] ENDOSCOPY;  Service: Gastroenterology;  Laterality: N/A;  . PORTA CATH INSERTION N/A 06/25/2019   Procedure: PORTA CATH INSERTION;  Surgeon: Algernon Huxley, MD;  Location: [place] CV LAB;  Service: Cardiovascular;  Laterality: N/A;  . PORTA CATH INSERTION Left 08/11/2020   Procedure: PORTA CATH INSERTION;  Surgeon: Algernon Huxley, MD;  Location: [place] CV LAB;  Service: Cardiovascular;  Laterality: Left;  . PORTA CATH REMOVAL Right 08/11/2020   Procedure: PORTA CATH REMOVAL;  Surgeon: Algernon Huxley, MD;  Location: [place] CV LAB;  Service: Cardiovascular;  Laterality: Right;    Prior to Admission medications   Medication Sig Start Date End Date Taking? Authorizing Provider  aspirin EC 81 MG tablet Take 81 mg by mouth daily.   Yes [provider]  SYMBICORT 80-4.5 MCG/ACT inhaler Inhale 2 puffs into the lungs 1 day or 1 dose.  11/21/19  Yes [provider]  azelastine (ASTELIN) 0.1 % nasal spray Place 1 spray into both nostrils 1 day or 1 dose. 08/23/19   [provider]  butalbital-acetaminophen-caffeine (FIORICET) 50-325-40 MG tablet Take 1 tablet by mouth every 4 (four) hours as needed. 03/25/20   [provider]  cetirizine (ZYRTEC) 10 MG tablet Take 1 tablet (10 mg total) by mouth daily. 12/14/16    Hagler, Jami L, PA-C  chlorthalidone (HYGROTON) 25 MG tablet Take 1 tablet by mouth daily. 11/21/19   [provider]  dexamethasone (DECADRON) 4 MG tablet Take 2 tablets (8 mg total) by mouth daily. Start the day after chemotherapy for 2 days. Take with food. 07/23/19   Verlon Au, NP  DEXILANT 60 MG capsule Take 1 capsule by mouth 1 day or 1 dose. 11/21/19   [provider]  DULoxetine (CYMBALTA) 30 MG capsule Take 1 capsule (30 mg total) by mouth 2 (two) times daily. 05/05/20   Sindy Guadeloupe, MD  fluticasone (FLONASE) 50 MCG/ACT nasal spray Place 2 sprays into both nostrils 1 day or 1 dose.  11/21/19   [provider]  fluticasone (VERAMYST) 27.5 MCG/SPRAY nasal spray Place 2 sprays into the nose daily.    [provider]  lactulose (CHRONULAC) 10 GM/15ML solution Take 30 mLs (20 g total) by mouth 3 (three) times daily. 02/11/21 08/10/21  [name], MD  lidocaine-prilocaine (EMLA) cream Apply to affected area once 02/23/21   Sindy Guadeloupe, MD  magic mouthwash w/lidocaine SOLN Take 5-10 mLs by mouth 4 (four) times daily as needed for mouth pain. 07/23/19   Verlon Au, NP  montelukast (SINGULAIR) 10 MG tablet Take 10 mg by mouth at bedtime.    [provider]  nicotine (NICODERM CQ - DOSED IN MG/24 HOURS) 21 mg/24hr patch Place 1 patch onto the skin 1 day or 1 dose. Patient not taking: No  sig reported 08/23/19   [provider]  ondansetron (ZOFRAN) 8 MG tablet Take 1 tablet (8 mg total) by mouth 2 (two) times daily as needed for refractory nausea / vomiting. Start on day 3 after chemotherapy. 06/20/19   Sindy Guadeloupe, MD  oxyCODONE (OXY IR/ROXICODONE) 5 MG immediate release tablet Take 1 tablet (5 mg total) by mouth every 6 (six) hours as needed for severe pain. Patient not taking: No sig reported 08/01/20   Sindy Guadeloupe, MD  pantoprazole (PROTONIX) 40 MG tablet Take 40 mg by mouth daily.     [provider]  potassium chloride SA  (K-DUR) 20 MEQ tablet Take 1 tablet (20 mEq total) by mouth daily. 08/13/19   Sindy Guadeloupe, MD  pregabalin (LYRICA) 75 MG capsule Take 1 capsule (75 mg total) by mouth 2 (two) times daily. 04/17/21   Sindy Guadeloupe, MD  prochlorperazine (COMPAZINE) 10 MG tablet Take 1 tablet (10 mg total) by mouth every 6 (six) hours as needed (Nausea or vomiting). Patient not taking: No sig reported 06/20/19   Sindy Guadeloupe, MD  simvastatin (ZOCOR) 20 MG tablet Take 20 mg by mouth daily.     [provider]  tamsulosin (FLOMAX) 0.4 MG CAPS capsule Take 0.4 mg by mouth.     [provider]    Allergies as of 05/01/2021  . (No Known Allergies)    Family History  Problem Relation Age of Onset  . Brain cancer Father     Social History   Socioeconomic History  . Marital status: Single    Spouse name: Not on file  . Number of children: Not on file  . Years of education: Not on file  . Highest education level: Not on file  Occupational History  . Not on file  Tobacco Use  . Smoking status: Current Every Day Smoker    Packs/day: 0.50    Years: 40.00    Pack years: 20.00    Types: Cigarettes  . Smokeless tobacco: Never Used  . Tobacco comment: smoking 40 years  Vaping Use  . Vaping Use: Never used  Substance and Sexual Activity  . Alcohol use: No  . Drug use: No  . Sexual activity: Not Currently  Other Topics Concern  . Not on file  Social History Narrative  . Not on file   Social Determinants of Health   Financial Resource Strain: Not on file  Food Insecurity: Not on file  Transportation Needs: Not on file  Physical Activity: Not on file  Stress: Not on file  Social Connections: Not on file  Intimate Partner Violence: Not on file    Review of Systems: See HPI, otherwise negative ROS  Physical Exam: BP 109/68   Pulse 75   Temp 97.8 F (36.6 C) (Temporal)   Resp 17   Ht 5\' 7"  (1.702 m)   Wt 65.1 kg   SpO2 100%   BMI 22.48 kg/m  General:   Alert,  pleasant  and cooperative in NAD Head:  Normocephalic and atraumatic. Neck:  Supple; no masses or thyromegaly. Lungs:  Clear throughout to auscultation, normal respiratory effort.    Heart:  +S1, +S2, Regular rate and rhythm, No edema. Abdomen:  Soft, nontender and nondistended. Normal bowel sounds, without guarding, and without rebound.   Neurologic:  Alert and  oriented x4;  grossly normal neurologically.  Impression/Plan: Eric Simon is here for a sigmoidoscop to evaluate for rectal bleeding .  Risks, benefits, limitations,  and alternatives regarding  colonoscopy have been reviewed with the patient.  Questions have been answered.  All parties agreeable.   [name], MD  05/07/2021, 10:56 AM

## 2021-05-08 ENCOUNTER — Encounter: Payer: Self-pay | Admitting: Gastroenterology

## 2021-05-08 NOTE — Anesthesia Postprocedure Evaluation (Signed)
Anesthesia Post Note  Patient: Eric Simon  Procedure(s) Performed: FLEXIBLE SIGMOIDOSCOPY (N/A )  Patient location during evaluation: Endoscopy Anesthesia Type: General Level of consciousness: awake and alert Pain management: pain level controlled Vital Signs Assessment: post-procedure vital signs reviewed and stable Respiratory status: spontaneous breathing, nonlabored ventilation, respiratory function stable and patient connected to nasal cannula oxygen Cardiovascular status: blood pressure returned to baseline and stable Postop Assessment: no apparent nausea or vomiting Anesthetic complications: no   No complications documented.   Last Vitals:  Vitals:   05/07/21 1120 05/07/21 1130  BP: (!) 81/67 112/67  Pulse: 66 69  Resp: (!) 21 20  Temp:    SpO2: 99% 100%    Last Pain:  Vitals:   05/07/21 1130  TempSrc:   PainSc: 0-No pain                 Precious Haws Jenan Ellegood

## 2021-05-17 NOTE — Progress Notes (Signed)
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 He currently smokes 0.5ppd. he presented to the ER with symptoms of left sided chest pain and left arm pain. Troponin was negative ekg was unremarkable. Ct chest showed no PE. He was incidentally noted to have mediastinal and retrocrural adenopathy and a 2.1X2.3X4.4 cm retroaortic soft tissue lesion in the posterior left chest. 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 Low-grade activity in the left lateral fifth and sixth ribs associated with nondisplaced fractures likely reflecting healing response problem malignancy.  Patient underwent CT-guided biopsy of the retroperitoneal lymph node pathology showed metastatic adenocarcinoma compatible with colorectal origin. CK7 negative. CK20 positive. CDX 2+. TTF-1 negative. PSA negative. This pattern of immunoreactivity supports the above diagnosis. Patient underwent colonoscopy which showed sigmoid mass that was consistent with adenocarcinoma. RASpanel testing showed that he was wild-type for both K-ras and BRAF  FOLFOX and bevacizumab chemotherapy started in July 2020. Subsequently oxaliplatin has been on hold given neuropathy.  Recent scans have shown slow  increase in the size of intra-abdominal adenopathy but patient continues to have low-volume disease.Irinotecan has not been added to the regimen yet.   Interval history-denies any additional rectal bleeding.  States he has been taking a laxative which appears to be helping.  Has noticed a cough over the past couple days but denies fever or nasal congestion.  Patient believes its his allergies.  Neuropathy is stable in his hands and feet.  He continues Cymbalta.  ECOG PS- 1 Pain scale- 0 Opioid associated constipation- no  Review of systems- Review of Systems  Constitutional: Positive for malaise/fatigue.  Respiratory: Positive for cough.   Gastrointestinal: Positive for constipation.  Neurological: Positive for sensory change.     No Known Allergies   Past Medical History:  Diagnosis Date  . [place]   . [place] disease) ([place]   . Hyperlipidemia      Past Surgical History:  Procedure Laterality Date  . COLONOSCOPY WITH PROPOFOL N/A 06/26/2019   Procedure: COLONOSCOPY WITH PROPOFOL;  Surgeon: AJonathon Bellows MD;  [facility];  Service: Gastroenterology;  Laterality: N/A;  . FLEXIBLE SIGMOIDOSCOPY N/A 05/07/2021   Procedure: FLEXIBLE SIGMOIDOSCOPY;  Surgeon: AJonathon Bellows MD;  [facility];  Service: Gastroenterology;  Laterality: N/A;  . PORTA CATH INSERTION N/A 06/25/2019   Procedure: PORTA CATH INSERTION;  Surgeon: DAlgernon Huxley MD;  [facility];  Service: Cardiovascular;  Laterality: N/A;  . PORTA CATH INSERTION Left 08/11/2020   Procedure: PORTA CATH INSERTION;  Surgeon: DAlgernon Huxley MD;  [facility];  Service: Cardiovascular;  Laterality: Left;  . PORTA CATH REMOVAL Right 08/11/2020   Procedure: PORTA CATH REMOVAL;  Surgeon: DAlgernon Huxley MD;  [facility];  Service: Cardiovascular;  Laterality: Right;  Social History   Socioeconomic History  . Marital  status: Single    Spouse name: Not on file  . Number of children: Not on file  . Years of education: Not on file  . Highest education level: Not on file  Occupational History  . Not on file  Tobacco Use  . Smoking status: Current Every Day Smoker    Packs/day: 0.50    Years: 40.00    Pack years: 20.00    Types: Cigarettes  . Smokeless tobacco: Never Used  . Tobacco comment: smoking 40 years  Vaping Use  . Vaping Use: Never used  Substance and Sexual Activity  . Alcohol use: No  . Drug use: No  . Sexual activity: Not Currently  Other Topics Concern  . Not on file  Social History Narrative  . Not on file   Social Determinants of Health   Financial Resource Strain: Not on file  Food Insecurity: Not on file  Transportation Needs: Not on file  Physical Activity: Not on file  Stress: Not on file  Social Connections: Not on file  Intimate Partner Violence: Not on file    Family History  Problem Relation Age of Onset  . Brain cancer Father      Current Outpatient Medications:  .  aspirin EC 81 MG tablet, Take 81 mg by mouth daily., Disp: , Rfl:  .  azelastine (ASTELIN) 0.1 % nasal spray, Place 1 spray into both nostrils 1 day or 1 dose., Disp: , Rfl:  .  butalbital-acetaminophen-caffeine (FIORICET) 50-325-40 MG tablet, Take 1 tablet by mouth every 4 (four) hours as needed., Disp: , Rfl:  .  cetirizine (ZYRTEC) 10 MG tablet, Take 1 tablet (10 mg total) by mouth daily., Disp: 30 tablet, Rfl: 0 .  chlorthalidone (HYGROTON) 25 MG tablet, Take 1 tablet by mouth daily., Disp: , Rfl:  .  dexamethasone (DECADRON) 4 MG tablet, Take 2 tablets (8 mg total) by mouth daily. Start the day after chemotherapy for 2 days. Take with food., Disp: 30 tablet, Rfl: 1 .  DEXILANT 60 MG capsule, Take 1 capsule by mouth 1 day or 1 dose., Disp: , Rfl:  .  DULoxetine (CYMBALTA) 30 MG capsule, Take 1 capsule (30 mg total) by mouth 2 (two) times daily., Disp: 60 capsule, Rfl: 1 .  fluticasone  (FLONASE) 50 MCG/ACT nasal spray, Place 2 sprays into both nostrils 1 day or 1 dose. , Disp: , Rfl:  .  fluticasone (VERAMYST) 27.5 MCG/SPRAY nasal spray, Place 2 sprays into the nose daily., Disp: , Rfl:  .  lactulose (CHRONULAC) 10 GM/15ML solution, Take 30 mLs (20 g total) by mouth 3 (three) times daily., Disp: 8100 mL, Rfl: 1 .  lidocaine-prilocaine (EMLA) cream, Apply to affected area once, Disp: 30 g, Rfl: 3 .  magic mouthwash w/lidocaine SOLN, Take 5-10 mLs by mouth 4 (four) times daily as needed for mouth pain., Disp: 480 mL, Rfl: 3 .  montelukast (SINGULAIR) 10 MG tablet, Take 10 mg by mouth at bedtime., Disp: , Rfl:  .  ondansetron (ZOFRAN) 8 MG tablet, Take 1 tablet (8 mg total) by mouth 2 (two) times daily as needed for refractory nausea / vomiting. Start on day 3 after chemotherapy., Disp: 30 tablet, Rfl: 1 .  pantoprazole (PROTONIX) 40 MG tablet, Take 40 mg by mouth daily. , Disp: , Rfl:  .  potassium chloride SA (K-DUR) 20 MEQ tablet, Take 1 tablet (20 mEq total) by mouth daily., Disp: 14 tablet, Rfl: 0 .  pregabalin (LYRICA) 75 MG capsule, Take 1 capsule (75 mg total) by mouth 2 (two) times daily., Disp: 60 capsule, Rfl: 2 .  simvastatin (ZOCOR) 20 MG tablet, Take 20 mg by mouth daily. , Disp: , Rfl:  .  SYMBICORT 80-4.5 MCG/ACT inhaler, Inhale 2 puffs into the lungs 1 day or 1 dose. , Disp: , Rfl:  .  tamsulosin (FLOMAX) 0.4 MG CAPS capsule, Take 0.4 mg by mouth. , Disp: , Rfl:  .  nicotine (NICODERM CQ - DOSED IN MG/24 HOURS) 21 mg/24hr patch, Place 1 patch onto the skin 1 day or 1 dose. (Patient not taking: No sig reported), Disp: , Rfl:  .  oxyCODONE (OXY IR/ROXICODONE) 5 MG immediate release tablet, Take 1 tablet (5 mg total) by mouth every 6 (six) hours as needed for severe pain. (Patient not taking: No sig reported), Disp: 30 tablet, Rfl: 0 .  prochlorperazine (COMPAZINE) 10 MG tablet, Take 1 tablet (10 mg total) by mouth every 6 (six) hours as needed (Nausea or vomiting).  (Patient not taking: No sig reported), Disp: 30 tablet, Rfl: 1 No current facility-administered medications for this visit.  Facility-Administered Medications Ordered in Other Visits:  .  sodium chloride flush (NS) 0.9 % injection 10 mL, 10 mL, Intracatheter, PRN, Sindy Guadeloupe, MD, 10 mL at 08/15/19 1405  Physical exam:  Vitals:   05/18/21 0853  BP: 118/73  Pulse: 75  Resp: 20  Temp: (!) 96.8 F (36 C)  TempSrc: Tympanic  Weight: 141 lb 1.6 oz (64 kg)   Physical Exam HENT:     Head: Normocephalic and atraumatic.  Eyes:     Pupils: Pupils are equal, round, and reactive to light.  Cardiovascular:     Rate and Rhythm: Normal rate and regular rhythm.     Heart sounds: Normal heart sounds.  Pulmonary:     Effort: Pulmonary effort is normal.     Breath sounds: Normal breath sounds.  Abdominal:     General: Bowel sounds are normal.     Palpations: Abdomen is soft.  Musculoskeletal:     Cervical back: Normal range of motion.  Skin:    General: Skin is warm and dry.  Neurological:     Mental Status: He is alert and oriented to person, place, and time.      CMP Latest Ref Rng & Units 05/18/2021  Glucose 70 - 99 mg/dL 94  BUN 8 - 23 mg/dL 21  Creatinine 0.61 - 1.24 mg/dL 0.92  Sodium 135 - 145 mmol/L 134(L)  Potassium 3.5 - 5.1 mmol/L 4.2  Chloride 98 - 111 mmol/L 102  CO2 22 - 32 mmol/L 22  Calcium 8.9 - 10.3 mg/dL 8.5(L)  Total Protein 6.5 - 8.1 g/dL 7.3  Total Bilirubin 0.3 - 1.2 mg/dL 0.7  Alkaline Phos 38 - 126 U/L 123  AST 15 - 41 U/L 46(H)  ALT 0 - 44 U/L 23   CBC Latest Ref Rng & Units 05/18/2021  WBC 4.0 - 10.5 K/uL 10.4  Hemoglobin 13.0 - 17.0 g/dL 12.0(L)  Hematocrit 39.0 - 52.0 % 35.7(L)  Platelets 150 - 400 K/uL 112(L)    No images are attached to the encounter.  No results found.   Assessment and plan- Patient is a 70 y.o. male with metastatic colon cancer here for on treatment assessment prior to cycle 36 of 5-FU Mvasi chemotherapy  Counts okay  to proceed with cycle 36 of 5-FU Mvasi chemotherapy today.  He will return to clinic in  3 weeks prior to cycle 37 to see Dr. [name].   There has been slow progression in the size of his intra-abdominal adenopathy.  However given the low-volume metastatic disease and to maintain his quality of life I have not added irinotecan to his regimen yet.  Chemo induced peripheral neuropathy: Continue Cymbalta.  Rectal bleeding: Appears to improved since starting a laxative to help with constipation.    Cough: Lungs clear on assessment.  Denies fever/nausea/vomiting or diarrhea.  Denies exposure to COVID.  If symptoms worsen, recommend chest x-ray.  Patient will let us know.  Recommend antihistamine such as Claritin or Zyrtec.  Greater than 50% was spent in counseling and coordination of care with this patient including but not limited to discussion of the relevant topics above (See A&P) including, but not limited to diagnosis and management of acute and chronic medical conditions.   Visit Diagnosis 1. 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 ([place])   2. Cough   3. Constipation, unspecified constipation type     Faythe Casa, NP 05/19/2021 12:37 PM

## 2021-05-18 ENCOUNTER — Inpatient Hospital Stay: Payer: Medicare HMO | Attending: Oncology

## 2021-05-18 ENCOUNTER — Encounter: Payer: Self-pay | Admitting: Oncology

## 2021-05-18 ENCOUNTER — Inpatient Hospital Stay: Payer: Medicare HMO

## 2021-05-18 ENCOUNTER — Inpatient Hospital Stay (HOSPITAL_BASED_OUTPATIENT_CLINIC_OR_DEPARTMENT_OTHER): Payer: Medicare HMO | Admitting: Oncology

## 2021-05-18 VITALS — BP 118/73 | HR 75 | Temp 96.8°F | Resp 20 | Wt 141.1 lb

## 2021-05-18 DIAGNOSIS — R059 Cough, unspecified: Secondary | ICD-10-CM | POA: Insufficient documentation

## 2021-05-18 DIAGNOSIS — Z5112 Encounter for antineoplastic immunotherapy: Secondary | ICD-10-CM | POA: Diagnosis not present

## 2021-05-18 DIAGNOSIS — Z5111 Encounter for antineoplastic chemotherapy: Secondary | ICD-10-CM | POA: Diagnosis not present

## 2021-05-18 DIAGNOSIS — Z5189 Encounter for other specified aftercare: Secondary | ICD-10-CM | POA: Diagnosis not present

## 2021-05-18 DIAGNOSIS — C189 Malignant neoplasm of colon, unspecified: Secondary | ICD-10-CM

## 2021-05-18 DIAGNOSIS — C187 Malignant neoplasm of sigmoid colon: Secondary | ICD-10-CM | POA: Insufficient documentation

## 2021-05-18 DIAGNOSIS — C772 Secondary and unspecified malignant neoplasm of intra-abdominal lymph nodes: Secondary | ICD-10-CM | POA: Diagnosis not present

## 2021-05-18 DIAGNOSIS — I959 Hypotension, unspecified: Secondary | ICD-10-CM | POA: Insufficient documentation

## 2021-05-18 DIAGNOSIS — G620 Drug-induced polyneuropathy: Secondary | ICD-10-CM | POA: Diagnosis not present

## 2021-05-18 DIAGNOSIS — K5900 Constipation, unspecified: Secondary | ICD-10-CM | POA: Insufficient documentation

## 2021-05-18 DIAGNOSIS — Z452 Encounter for adjustment and management of vascular access device: Secondary | ICD-10-CM | POA: Insufficient documentation

## 2021-05-18 LAB — COMPREHENSIVE METABOLIC PANEL
ALT: 23 U/L (ref 0–44)
AST: 46 U/L — ABNORMAL HIGH (ref 15–41)
Albumin: 3.7 g/dL (ref 3.5–5.0)
Alkaline Phosphatase: 123 U/L (ref 38–126)
Anion gap: 10 (ref 5–15)
BUN: 21 mg/dL (ref 8–23)
CO2: 22 mmol/L (ref 22–32)
Calcium: 8.5 mg/dL — ABNORMAL LOW (ref 8.9–10.3)
Chloride: 102 mmol/L (ref 98–111)
Creatinine, Ser: 0.92 mg/dL (ref 0.61–1.24)
GFR, Estimated: >60 mL/min (ref >60)
Glucose, Bld: 94 mg/dL (ref 70–99)
Potassium: 4.2 mmol/L (ref 3.5–5.1)
Sodium: 134 mmol/L — ABNORMAL LOW (ref 135–145)
Total Bilirubin: 0.7 mg/dL (ref 0.3–1.2)
Total Protein: 7.3 g/dL (ref 6.5–8.1)

## 2021-05-18 LAB — CBC WITH DIFFERENTIAL/PLATELET
Abs Immature Granulocytes: 0.04 10*3/uL (ref 0.00–0.07)
Basophils Absolute: 0.1 10*3/uL (ref 0.0–0.1)
Basophils Relative: 1 %
Eosinophils Absolute: 0.1 10*3/uL (ref 0.0–0.5)
Eosinophils Relative: 1 %
HCT: 35.7 % — ABNORMAL LOW (ref 39.0–52.0)
Hemoglobin: 12 g/dL — ABNORMAL LOW (ref 13.0–17.0)
Immature Granulocytes: 0 %
Lymphocytes Relative: 17 %
Lymphs Abs: 1.8 10*3/uL (ref 0.7–4.0)
MCH: 31.3 pg (ref 26.0–34.0)
MCHC: 33.6 g/dL (ref 30.0–36.0)
MCV: 93 fL (ref 80.0–100.0)
Monocytes Absolute: 0.9 10*3/uL (ref 0.1–1.0)
Monocytes Relative: 9 %
Neutro Abs: 7.4 10*3/uL (ref 1.7–7.7)
Neutrophils Relative %: 72 %
Platelets: 112 10*3/uL — ABNORMAL LOW (ref 150–400)
RBC: 3.84 MIL/uL — ABNORMAL LOW (ref 4.22–5.81)
RDW: 16.2 % — ABNORMAL HIGH (ref 11.5–15.5)
WBC: 10.4 10*3/uL (ref 4.0–10.5)
nRBC: 0 % (ref 0.0–0.2)

## 2021-05-18 LAB — PROTEIN, URINE, RANDOM: Total Protein, Urine: 10 mg/dL

## 2021-05-18 MED ORDER — SODIUM CHLORIDE 0.9% FLUSH
10.0000 mL | INTRAVENOUS | Status: DC | PRN
  Administered 2021-05-18: 10 mL via INTRAVENOUS
  Filled 2021-05-18: qty 10

## 2021-05-18 MED ORDER — FLUOROURACIL CHEMO INJECTION 2.5 GM/50ML
400.0000 mg/m2 | Freq: Once | INTRAVENOUS | Status: AC
  Administered 2021-05-18: 700 mg via INTRAVENOUS
  Filled 2021-05-18: qty 14

## 2021-05-18 MED ORDER — SODIUM CHLORIDE 0.9 % IV SOLN
2400.0000 mg/m2 | INTRAVENOUS | Status: DC
  Administered 2021-05-18: 4300 mg via INTRAVENOUS
  Filled 2021-05-18: qty 86

## 2021-05-18 MED ORDER — SODIUM CHLORIDE 0.9 % IV SOLN
700.0000 mg | Freq: Once | INTRAVENOUS | Status: AC
  Administered 2021-05-18: 700 mg via INTRAVENOUS
  Filled 2021-05-18: qty 25

## 2021-05-18 MED ORDER — SODIUM CHLORIDE 0.9 % IV SOLN
7.5000 mg/kg | Freq: Once | INTRAVENOUS | Status: AC
  Administered 2021-05-18: 500 mg via INTRAVENOUS
  Filled 2021-05-18: qty 16

## 2021-05-18 MED ORDER — HEPARIN SOD (PORK) LOCK FLUSH 100 UNIT/ML IV SOLN
500.0000 [IU] | Freq: Once | INTRAVENOUS | Status: DC
Start: 2021-05-18 — End: 2021-05-18
  Filled 2021-05-18: qty 5

## 2021-05-18 MED ORDER — DEXAMETHASONE SODIUM PHOSPHATE 10 MG/ML IJ SOLN
10.0000 mg | Freq: Once | INTRAMUSCULAR | Status: AC
  Administered 2021-05-18: 10 mg via INTRAVENOUS
  Filled 2021-05-18: qty 1

## 2021-05-18 MED ORDER — SODIUM CHLORIDE 0.9 % IV SOLN
Freq: Once | INTRAVENOUS | Status: AC
  Filled 2021-05-18: qty 250

## 2021-05-18 NOTE — Progress Notes (Signed)
Patient denies any concerns today.  

## 2021-05-18 NOTE — Patient Instructions (Signed)
[facility]    Discharge Instructions:  Thank you for choosing [facility] to provide your oncology and hematology care.  If you have a lab appointment with the [facility], please go directly to the [facility] and check in at the registration area.  Wear comfortable clothing and clothing appropriate for easy access to any Portacath or PICC line.   We strive to give you quality time with your provider. You may need to reschedule your appointment if you arrive late (15 or more minutes).  Arriving late affects you and other patients whose appointments are after yours.  Also, if you miss three or more appointments without notifying the office, you may be dismissed from the clinic at the provider's discretion.      For prescription refill requests, have your pharmacy contact our office and allow 72 hours for refills to be completed.    Today you received the following chemotherapy and/or immunotherapy agents: Bevacizumab-awwb (MVASI), Leucovorin, Fluorouracil.      To help prevent nausea and vomiting after your treatment, we encourage you to take your nausea medication as directed.  BELOW ARE SYMPTOMS THAT SHOULD BE REPORTED IMMEDIATELY: . *FEVER GREATER THAN 100.4 F (38 C) OR HIGHER . *CHILLS OR SWEATING . *NAUSEA AND VOMITING THAT IS NOT CONTROLLED WITH YOUR NAUSEA MEDICATION . *UNUSUAL SHORTNESS OF BREATH . *UNUSUAL BRUISING OR BLEEDING . *URINARY PROBLEMS (pain or burning when urinating, or frequent urination) . *BOWEL PROBLEMS (unusual diarrhea, constipation, pain near the anus) . TENDERNESS IN MOUTH AND THROAT WITH OR WITHOUT PRESENCE OF ULCERS (sore throat, sores in mouth, or a toothache) . UNUSUAL RASH, SWELLING OR PAIN  . UNUSUAL VAGINAL DISCHARGE OR ITCHING   Items with * indicate a potential emergency and should be followed up as soon as possible or go to the Emergency Department if any problems should occur.  Please show  the CHEMOTHERAPY ALERT CARD or IMMUNOTHERAPY ALERT CARD at check-in to the Emergency Department and triage nurse.  Should you have questions after your visit or need to cancel or reschedule your appointment, please contact [facility]  [phone] and follow the prompts.  Office hours are 8:00 a.m. to 4:30 p.m. Monday - Friday. Please note that voicemails left after 4:00 p.m. may not be returned until the following business day.  We are closed weekends and major holidays. You have access to a nurse at all times for urgent questions. Please call the main number to the clinic [phone] and follow the prompts.  For any non-urgent questions, you may also contact your provider using MyChart. We now offer e-Visits for anyone 52 and older to request care online for non-urgent symptoms. For details visit [email or web address].   Also download the MyChart app! Go to the app store, search "MyChart", open the app, select [facility], and log in with your MyChart username and password.  Due to Covid, a mask is required upon entering the hospital/clinic. If you do not have a mask, one will be given to you upon arrival. For doctor visits, patients may have 1 support person aged 54 or older with them. For treatment visits, patients cannot have anyone with them due to current Covid guidelines and our immunocompromised population.   Bevacizumab injection  What is this medicine? BEVACIZUMAB (be va SIZ yoo mab) is a monoclonal antibody. It is used to treat many types of cancer. This medicine may be used for other purposes; ask your health care provider  or pharmacist if you have questions. COMMON BRAND NAME(S): Avastin, MVASI, Zirabev What should I tell my health care provider before I take this medicine? They need to know if you have any of these conditions:  diabetes  heart disease  high blood pressure  history of coughing up blood  prior anthracycline chemotherapy  (e.g., doxorubicin, daunorubicin, epirubicin)  recent or ongoing radiation therapy  recent or planning to have surgery  stroke  an unusual or allergic reaction to bevacizumab, hamster proteins, mouse proteins, other medicines, foods, dyes, or preservatives  pregnant or trying to get pregnant  breast-feeding How should I use this medicine? This medicine is for infusion into a vein. It is given by a health care professional in a hospital or clinic setting. Talk to your pediatrician regarding the use of this medicine in children. Special care may be needed. Overdosage: If you think you have taken too much of this medicine contact a poison control center or emergency room at once. NOTE: This medicine is only for you. Do not share this medicine with others. What if I miss a dose? It is important not to miss your dose. Call your doctor or health care professional if you are unable to keep an appointment. What may interact with this medicine? Interactions are not expected. This list may not describe all possible interactions. Give your health care provider a list of all the medicines, herbs, non-prescription drugs, or dietary supplements you use. Also tell them if you smoke, drink alcohol, or use illegal drugs. Some items may interact with your medicine. What should I watch for while using this medicine? Your condition will be monitored carefully while you are receiving this medicine. You will need important blood work and urine testing done while you are taking this medicine. This medicine may increase your risk to bruise or bleed. Call your doctor or health care professional if you notice any unusual bleeding. Before having surgery, talk to your health care provider to make sure it is ok. This drug can increase the risk of poor healing of your surgical site or wound. You will need to stop this drug for 28 days before surgery. After surgery, wait at least 28 days before restarting this drug. Make  sure the surgical site or wound is healed enough before restarting this drug. Talk to your health care provider if questions. Do not become pregnant while taking this medicine or for 6 months after stopping it. Women should inform their doctor if they wish to become pregnant or think they might be pregnant. There is a potential for serious side effects to an unborn child. Talk to your health care professional or pharmacist for more information. Do not breast-feed an infant while taking this medicine and for 6 months after the last dose. This medicine has caused ovarian failure in some women. This medicine may interfere with the ability to have a child. You should talk to your doctor or health care professional if you are concerned about your fertility. What side effects may I notice from receiving this medicine? Side effects that you should report to your doctor or health care professional as soon as possible:  allergic reactions like skin rash, itching or hives, swelling of the face, lips, or tongue  chest pain or chest tightness  chills  coughing up blood  high fever  seizures  severe constipation  signs and symptoms of bleeding such as bloody or black, tarry stools; red or dark-brown urine; spitting up blood or brown material that  looks like coffee grounds; red spots on the skin; unusual bruising or bleeding from the eye, gums, or nose  signs and symptoms of a blood clot such as breathing problems; chest pain; severe, sudden headache; pain, swelling, warmth in the leg  signs and symptoms of a stroke like changes in vision; confusion; trouble speaking or understanding; severe headaches; sudden numbness or weakness of the face, arm or leg; trouble walking; dizziness; loss of balance or coordination  stomach pain  sweating  swelling of legs or ankles  vomiting  weight gain Side effects that usually do not require medical attention (report to your doctor or health care professional  if they continue or are bothersome):  back pain  changes in taste  decreased appetite  dry skin  nausea  tiredness This list may not describe all possible side effects. Call your doctor for medical advice about side effects. You may report side effects to FDA at 1-800-FDA-1088. Where should I keep my medicine? This drug is given in a hospital or clinic and will not be stored at home. NOTE: This sheet is a summary. It may not cover all possible information. If you have questions about this medicine, talk to your doctor, pharmacist, or health care provider.  2021 Elsevier/Gold Standard (2019-09-26 10:50:46)  Leucovorin injection  What is this medicine? LEUCOVORIN (loo koe VOR in) is used to prevent or treat the harmful effects of some medicines. This medicine is used to treat anemia caused by a low amount of folic acid in the body. It is also used with 5-fluorouracil (5-FU) to treat colon cancer. This medicine may be used for other purposes; ask your health care provider or pharmacist if you have questions. What should I tell my health care provider before I take this medicine? They need to know if you have any of these conditions:  anemia from low levels of vitamin B-12 in the blood  an unusual or allergic reaction to leucovorin, folic acid, other medicines, foods, dyes, or preservatives  pregnant or trying to get pregnant  breast-feeding How should I use this medicine? This medicine is for injection into a muscle or into a vein. It is given by a health care professional in a hospital or clinic setting. Talk to your pediatrician regarding the use of this medicine in children. Special care may be needed. Overdosage: If you think you have taken too much of this medicine contact a poison control center or emergency room at once. NOTE: This medicine is only for you. Do not share this medicine with others. What if I miss a dose? This does not apply. What may interact with this  medicine?  capecitabine  fluorouracil  phenobarbital  phenytoin  primidone  trimethoprim-sulfamethoxazole This list may not describe all possible interactions. Give your health care provider a list of all the medicines, herbs, non-prescription drugs, or dietary supplements you use. Also tell them if you smoke, drink alcohol, or use illegal drugs. Some items may interact with your medicine. What should I watch for while using this medicine? Your condition will be monitored carefully while you are receiving this medicine. This medicine may increase the side effects of 5-fluorouracil, 5-FU. Tell your doctor or health care professional if you have diarrhea or mouth sores that do not get better or that get worse. What side effects may I notice from receiving this medicine? Side effects that you should report to your doctor or health care professional as soon as possible:  allergic reactions like skin rash, itching  or hives, swelling of the face, lips, or tongue  breathing problems  fever, infection  mouth sores  unusual bleeding or bruising  unusually weak or tired Side effects that usually do not require medical attention (report to your doctor or health care professional if they continue or are bothersome):  constipation or diarrhea  loss of appetite  nausea, vomiting This list may not describe all possible side effects. Call your doctor for medical advice about side effects. You may report side effects to FDA at 1-800-FDA-1088. Where should I keep my medicine? This drug is given in a hospital or clinic and will not be stored at home. NOTE: This sheet is a summary. It may not cover all possible information. If you have questions about this medicine, talk to your doctor, pharmacist, or health care provider.  2021 Elsevier/Gold Standard (2008-06-04 16:50:29)  Fluorouracil, 5-FU injection  What is this medicine? FLUOROURACIL, 5-FU (flure oh YOOR a sil) is a chemotherapy  drug. It slows the growth of cancer cells. This medicine is used to treat many types of cancer like breast cancer, colon or rectal cancer, pancreatic cancer, and stomach cancer. This medicine may be used for other purposes; ask your health care provider or pharmacist if you have questions. COMMON BRAND NAME(S): Adrucil What should I tell my health care provider before I take this medicine? They need to know if you have any of these conditions:  blood disorders  dihydropyrimidine dehydrogenase (DPD) deficiency  infection (especially a virus infection such as chickenpox, cold sores, or herpes)  kidney disease  liver disease  malnourished, poor nutrition  recent or ongoing radiation therapy  an unusual or allergic reaction to fluorouracil, other chemotherapy, other medicines, foods, dyes, or preservatives  pregnant or trying to get pregnant  breast-feeding How should I use this medicine? This drug is given as an infusion or injection into a vein. It is administered in a hospital or clinic by a specially trained health care professional. Talk to your pediatrician regarding the use of this medicine in children. Special care may be needed. Overdosage: If you think you have taken too much of this medicine contact a poison control center or emergency room at once. NOTE: This medicine is only for you. Do not share this medicine with others. What if I miss a dose? It is important not to miss your dose. Call your doctor or health care professional if you are unable to keep an appointment. What may interact with this medicine? Do not take this medicine with any of the following medications:  live virus vaccines This medicine may also interact with the following medications:  medicines that treat or prevent blood clots like warfarin, enoxaparin, and dalteparin This list may not describe all possible interactions. Give your health care provider a list of all the medicines, herbs,  non-prescription drugs, or dietary supplements you use. Also tell them if you smoke, drink alcohol, or use illegal drugs. Some items may interact with your medicine. What should I watch for while using this medicine? Visit your doctor for checks on your progress. This drug may make you feel generally unwell. This is not uncommon, as chemotherapy can affect healthy cells as well as cancer cells. Report any side effects. Continue your course of treatment even though you feel ill unless your doctor tells you to stop. In some cases, you may be given additional medicines to help with side effects. Follow all directions for their use. Call your doctor or health care professional for advice  if you get a fever, chills or sore throat, or other symptoms of a cold or flu. Do not treat yourself. This drug decreases your body's ability to fight infections. Try to avoid being around people who are sick. This medicine may increase your risk to bruise or bleed. Call your doctor or health care professional if you notice any unusual bleeding. Be careful brushing and flossing your teeth or using a toothpick because you may get an infection or bleed more easily. If you have any dental work done, tell your dentist you are receiving this medicine. Avoid taking products that contain aspirin, acetaminophen, ibuprofen, naproxen, or ketoprofen unless instructed by your doctor. These medicines may hide a fever. Do not become pregnant while taking this medicine. Women should inform their doctor if they wish to become pregnant or think they might be pregnant. There is a potential for serious side effects to an unborn child. Talk to your health care professional or pharmacist for more information. Do not breast-feed an infant while taking this medicine. Men should inform their doctor if they wish to father a child. This medicine may lower sperm counts. Do not treat diarrhea with over the counter products. Contact your doctor if you  have diarrhea that lasts more than 2 days or if it is severe and watery. This medicine can make you more sensitive to the sun. Keep out of the sun. If you cannot avoid being in the sun, wear protective clothing and use sunscreen. Do not use sun lamps or tanning beds/booths. What side effects may I notice from receiving this medicine? Side effects that you should report to your doctor or health care professional as soon as possible:  allergic reactions like skin rash, itching or hives, swelling of the face, lips, or tongue  low blood counts - this medicine may decrease the number of white blood cells, red blood cells and platelets. You may be at increased risk for infections and bleeding.  signs of infection - fever or chills, cough, sore throat, pain or difficulty passing urine  signs of decreased platelets or bleeding - bruising, pinpoint red spots on the skin, black, tarry stools, blood in the urine  signs of decreased red blood cells - unusually weak or tired, fainting spells, lightheadedness  breathing problems  changes in vision  chest pain  mouth sores  nausea and vomiting  pain, swelling, redness at site where injected  pain, tingling, numbness in the hands or feet  redness, swelling, or sores on hands or feet  stomach pain  unusual bleeding Side effects that usually do not require medical attention (report to your doctor or health care professional if they continue or are bothersome):  changes in finger or toe nails  diarrhea  dry or itchy skin  hair loss  headache  loss of appetite  sensitivity of eyes to the light  stomach upset  unusually teary eyes This list may not describe all possible side effects. Call your doctor for medical advice about side effects. You may report side effects to FDA at 1-800-FDA-1088. Where should I keep my medicine? This drug is given in a hospital or clinic and will not be stored at home. NOTE: This sheet is a summary. It  may not cover all possible information. If you have questions about this medicine, talk to your doctor, pharmacist, or health care provider.  2021 Elsevier/Gold Standard (2019-10-30 15:00:03)

## 2021-05-19 ENCOUNTER — Encounter: Payer: Self-pay | Admitting: Oncology

## 2021-05-20 ENCOUNTER — Inpatient Hospital Stay: Payer: Medicare HMO

## 2021-05-20 VITALS — BP 117/57 | HR 83 | Temp 97.9°F | Resp 18

## 2021-05-20 DIAGNOSIS — I959 Hypotension, unspecified: Secondary | ICD-10-CM | POA: Diagnosis not present

## 2021-05-20 DIAGNOSIS — K5900 Constipation, unspecified: Secondary | ICD-10-CM | POA: Diagnosis not present

## 2021-05-20 DIAGNOSIS — C772 Secondary and unspecified malignant neoplasm of intra-abdominal lymph nodes: Secondary | ICD-10-CM | POA: Diagnosis not present

## 2021-05-20 DIAGNOSIS — Z452 Encounter for adjustment and management of vascular access device: Secondary | ICD-10-CM | POA: Diagnosis not present

## 2021-05-20 DIAGNOSIS — G620 Drug-induced polyneuropathy: Secondary | ICD-10-CM | POA: Diagnosis not present

## 2021-05-20 DIAGNOSIS — R059 Cough, unspecified: Secondary | ICD-10-CM | POA: Diagnosis not present

## 2021-05-20 DIAGNOSIS — C189 Malignant neoplasm of colon, unspecified: Secondary | ICD-10-CM

## 2021-05-20 DIAGNOSIS — Z5112 Encounter for antineoplastic immunotherapy: Secondary | ICD-10-CM | POA: Diagnosis not present

## 2021-05-20 DIAGNOSIS — Z95828 Presence of other vascular implants and grafts: Secondary | ICD-10-CM

## 2021-05-20 DIAGNOSIS — C187 Malignant neoplasm of sigmoid colon: Secondary | ICD-10-CM | POA: Diagnosis not present

## 2021-05-20 DIAGNOSIS — Z5111 Encounter for antineoplastic chemotherapy: Secondary | ICD-10-CM | POA: Diagnosis not present

## 2021-05-20 MED ORDER — HEPARIN SOD (PORK) LOCK FLUSH 100 UNIT/ML IV SOLN
500.0000 [IU] | Freq: Once | INTRAVENOUS | Status: AC
Start: 2021-05-20 — End: 2021-05-20
  Administered 2021-05-20: 500 [IU] via INTRAVENOUS
  Filled 2021-05-20: qty 5

## 2021-05-20 MED ORDER — HEPARIN SOD (PORK) LOCK FLUSH 100 UNIT/ML IV SOLN
INTRAVENOUS | Status: AC
  Filled 2021-05-20: qty 5

## 2021-05-20 MED ORDER — PEGFILGRASTIM-CBQV 6 MG/0.6ML ~~LOC~~ SOSY
6.0000 mg | PREFILLED_SYRINGE | Freq: Once | SUBCUTANEOUS | Status: AC
  Administered 2021-05-20: 6 mg via SUBCUTANEOUS
  Filled 2021-05-20: qty 0.6

## 2021-05-20 NOTE — Patient Instructions (Signed)
[facility]  Discharge Instructions: Thank you for choosing [facility] to provide your oncology and hematology care.  If you have a lab appointment with the [facility], please go directly to the [facility] and check in at the registration area.  Wear comfortable clothing and clothing appropriate for easy access to any Portacath or PICC line.   We strive to give you quality time with your provider. You may need to reschedule your appointment if you arrive late (15 or more minutes).  Arriving late affects you and other patients whose appointments are after yours.  Also, if you miss three or more appointments without notifying the office, you may be dismissed from the clinic at the provider's discretion.      For prescription refill requests, have your pharmacy contact our office and allow 72 hours for refills to be completed.    Today you received the following chemotherapy and/or immunotherapy agents UDENCYA    To help prevent nausea and vomiting after your treatment, we encourage you to take your nausea medication as directed.  BELOW ARE SYMPTOMS THAT SHOULD BE REPORTED IMMEDIATELY: . *FEVER GREATER THAN 100.4 F (38 C) OR HIGHER . *CHILLS OR SWEATING . *NAUSEA AND VOMITING THAT IS NOT CONTROLLED WITH YOUR NAUSEA MEDICATION . *UNUSUAL SHORTNESS OF BREATH . *UNUSUAL BRUISING OR BLEEDING . *URINARY PROBLEMS (pain or burning when urinating, or frequent urination) . *BOWEL PROBLEMS (unusual diarrhea, constipation, pain near the anus) . TENDERNESS IN MOUTH AND THROAT WITH OR WITHOUT PRESENCE OF ULCERS (sore throat, sores in mouth, or a toothache) . UNUSUAL RASH, SWELLING OR PAIN  . UNUSUAL VAGINAL DISCHARGE OR ITCHING   Items with * indicate a potential emergency and should be followed up as soon as possible or go to the Emergency Department if any problems should occur.  Please show the CHEMOTHERAPY ALERT CARD or IMMUNOTHERAPY ALERT  CARD at check-in to the Emergency Department and triage nurse.  Should you have questions after your visit or need to cancel or reschedule your appointment, please contact [facility]  [phone] and follow the prompts.  Office hours are 8:00 a.m. to 4:30 p.m. Monday - Friday. Please note that voicemails left after 4:00 p.m. may not be returned until the following business day.  We are closed weekends and major holidays. You have access to a nurse at all times for urgent questions. Please call the main number to the clinic [phone] and follow the prompts.  For any non-urgent questions, you may also contact your provider using MyChart. We now offer e-Visits for anyone 48 and older to request care online for non-urgent symptoms. For details visit [email or web address].   Also download the MyChart app! Go to the app store, search "MyChart", open the app, select [facility], and log in with your MyChart username and password.  Due to Covid, a mask is required upon entering the hospital/clinic. If you do not have a mask, one will be given to you upon arrival. For doctor visits, patients may have 1 support person aged 20 or older with them. For treatment visits, patients cannot have anyone with them due to current Covid guidelines and our immunocompromised population.   Pegfilgrastim injection What is this medicine? PEGFILGRASTIM (PEG fil gra stim) is a long-acting granulocyte colony-stimulating factor that stimulates the growth of neutrophils, a type of white blood cell important in the body's fight against infection. It is used to reduce the incidence of fever and infection  in patients with certain types of cancer who are receiving chemotherapy that affects the bone marrow, and to increase survival after being exposed to high doses of radiation. This medicine may be used for other purposes; ask your health care provider or pharmacist if you have questions. COMMON  BRAND NAME(S): Rexene Edison, Ziextenzo What should I tell my health care provider before I take this medicine? They need to know if you have any of these conditions:  kidney disease  latex allergy  ongoing radiation therapy  sickle cell disease  skin reactions to acrylic adhesives (On-Body Injector only)  an unusual or allergic reaction to pegfilgrastim, filgrastim, other medicines, foods, dyes, or preservatives  pregnant or trying to get pregnant  breast-feeding How should I use this medicine? This medicine is for injection under the skin. If you get this medicine at home, you will be taught how to prepare and give the pre-filled syringe or how to use the On-body Injector. Refer to the patient Instructions for Use for detailed instructions. Use exactly as directed. Tell your healthcare provider immediately if you suspect that the On-body Injector may not have performed as intended or if you suspect the use of the On-body Injector resulted in a missed or partial dose. It is important that you put your used needles and syringes in a special sharps container. Do not put them in a trash can. If you do not have a sharps container, call your pharmacist or healthcare provider to get one. Talk to your pediatrician regarding the use of this medicine in children. While this drug may be prescribed for selected conditions, precautions do apply. Overdosage: If you think you have taken too much of this medicine contact a poison control center or emergency room at once. NOTE: This medicine is only for you. Do not share this medicine with others. What if I miss a dose? It is important not to miss your dose. Call your doctor or health care professional if you miss your dose. If you miss a dose due to an On-body Injector failure or leakage, a new dose should be administered as soon as possible using a single prefilled syringe for manual use. What may interact with this  medicine? Interactions have not been studied. This list may not describe all possible interactions. Give your health care provider a list of all the medicines, herbs, non-prescription drugs, or dietary supplements you use. Also tell them if you smoke, drink alcohol, or use illegal drugs. Some items may interact with your medicine. What should I watch for while using this medicine? Your condition will be monitored carefully while you are receiving this medicine. You may need blood work done while you are taking this medicine. Talk to your health care provider about your risk of cancer. You may be more at risk for certain types of cancer if you take this medicine. If you are going to need a MRI, CT scan, or other procedure, tell your doctor that you are using this medicine (On-Body Injector only). What side effects may I notice from receiving this medicine? Side effects that you should report to your doctor or health care professional as soon as possible:  allergic reactions (skin rash, itching or hives, swelling of the face, lips, or tongue)  back pain  dizziness  fever  pain, redness, or irritation at site where injected  pinpoint red spots on the skin  red or dark-brown urine  shortness of breath or breathing problems  stomach or side pain, or  pain at the shoulder  swelling  tiredness  trouble passing urine or change in the amount of urine  unusual bruising or bleeding Side effects that usually do not require medical attention (report to your doctor or health care professional if they continue or are bothersome):  bone pain  muscle pain This list may not describe all possible side effects. Call your doctor for medical advice about side effects. You may report side effects to FDA at 1-800-FDA-1088. Where should I keep my medicine? Keep out of the reach of children. If you are using this medicine at home, you will be instructed on how to store it. Throw away any unused  medicine after the expiration date on the label. NOTE: This sheet is a summary. It may not cover all possible information. If you have questions about this medicine, talk to your doctor, pharmacist, or health care provider.  2021 Elsevier/Gold Standard (2019-12-21 13:20:51)

## 2021-06-04 ENCOUNTER — Encounter: Payer: Self-pay | Admitting: Gastroenterology

## 2021-06-04 ENCOUNTER — Other Ambulatory Visit: Payer: Self-pay

## 2021-06-04 ENCOUNTER — Ambulatory Visit (INDEPENDENT_AMBULATORY_CARE_PROVIDER_SITE_OTHER): Payer: Medicare HMO | Admitting: Gastroenterology

## 2021-06-04 VITALS — BP 106/66 | HR 89 | Temp 97.8°F | Ht 67.0 in | Wt 139.4 lb

## 2021-06-04 DIAGNOSIS — K648 Other hemorrhoids: Secondary | ICD-10-CM

## 2021-06-04 NOTE — Progress Notes (Signed)
Eric Bellows MD, MRCP(U.K) [address]  [place]  [place], [place] [address]  Main: [phone]  Fax: [phone]   Primary Care Physician: Jodi Marble, MD  Primary Gastroenterologist:  Dr. Jonathon Simon   Chief Complaint  Patient presents with   Rectal Bleeding    HPI: Eric Simon is a 70 y.o. male who is a patient of mine who has been diagnosed with colorectal cancer undergoing treatment with oncology.  I had performed a sigmoidoscopy on him on 05/07/2021 with recurrent rectal bleeding.  There was no evidence of any tumor but there were large internal hemorrhoids.  I brought him in to my office on 05/07/2021 to band the internal hemorrhoids but noted that he had a posterior anal fissure which is causing him pain.  The plan was to treat his anal fissure with conservative management and bring him back today to band the hemorrhoids.  He states that since he follow the advice that we gave him at his last visit with conservative therapy he has had no further rectal bleeding and no anal pain.  He wishes not to proceed with any banding at this point of time.  Current Outpatient Medications  Medication Sig Dispense Refill   aspirin EC 81 MG tablet Take 81 mg by mouth daily.     azelastine (ASTELIN) 0.1 % nasal spray Place 1 spray into both nostrils 1 day or 1 dose.     butalbital-acetaminophen-caffeine (FIORICET) 50-325-40 MG tablet Take 1 tablet by mouth every 4 (four) hours as needed.     cetirizine (ZYRTEC) 10 MG tablet Take 1 tablet (10 mg total) by mouth daily. 30 tablet 0   chlorthalidone (HYGROTON) 25 MG tablet Take 1 tablet by mouth daily.     dexamethasone (DECADRON) 4 MG tablet Take 2 tablets (8 mg total) by mouth daily. Start the day after chemotherapy for 2 days. Take with food. 30 tablet 1   DEXILANT 60 MG capsule Take 1 capsule by mouth 1 day or 1 dose.     DULoxetine (CYMBALTA) 30 MG capsule Take 1 capsule (30 mg total) by mouth 2 (two) times daily. 60 capsule  1   fluticasone (FLONASE) 50 MCG/ACT nasal spray Place 2 sprays into both nostrils 1 day or 1 dose.      fluticasone (VERAMYST) 27.5 MCG/SPRAY nasal spray Place 2 sprays into the nose daily.     lactulose (CHRONULAC) 10 GM/15ML solution Take 30 mLs (20 g total) by mouth 3 (three) times daily. 8100 mL 1   lidocaine-prilocaine (EMLA) cream Apply to affected area once 30 g 3   magic mouthwash w/lidocaine SOLN Take 5-10 mLs by mouth 4 (four) times daily as needed for mouth pain. 480 mL 3   montelukast (SINGULAIR) 10 MG tablet Take 10 mg by mouth at bedtime.     nicotine (NICODERM CQ - DOSED IN MG/24 HOURS) 21 mg/24hr patch Place 1 patch onto the skin 1 day or 1 dose.     ondansetron (ZOFRAN) 8 MG tablet Take 1 tablet (8 mg total) by mouth 2 (two) times daily as needed for refractory nausea / vomiting. Start on day 3 after chemotherapy. 30 tablet 1   oxyCODONE (OXY IR/ROXICODONE) 5 MG immediate release tablet Take 1 tablet (5 mg total) by mouth every 6 (six) hours as needed for severe pain. 30 tablet 0   pantoprazole (PROTONIX) 40 MG tablet Take 40 mg by mouth daily.      potassium chloride SA (K-DUR) 20  MEQ tablet Take 1 tablet (20 mEq total) by mouth daily. 14 tablet 0   pregabalin (LYRICA) 75 MG capsule Take 1 capsule (75 mg total) by mouth 2 (two) times daily. 60 capsule 2   prochlorperazine (COMPAZINE) 10 MG tablet Take 1 tablet (10 mg total) by mouth every 6 (six) hours as needed (Nausea or vomiting). 30 tablet 1   simvastatin (ZOCOR) 20 MG tablet Take 20 mg by mouth daily.      SYMBICORT 80-4.5 MCG/ACT inhaler Inhale 2 puffs into the lungs 1 day or 1 dose.      tamsulosin (FLOMAX) 0.4 MG CAPS capsule Take 0.4 mg by mouth.      No current facility-administered medications for this visit.   Facility-Administered Medications Ordered in Other Visits  Medication Dose Route Frequency Provider Last Rate Last Admin   sodium chloride flush (NS) 0.9 % injection 10 mL  10 mL Intracatheter PRN Sindy Guadeloupe, MD   10 mL at 08/15/19 1405    Allergies as of 06/04/2021   (No Known Allergies)    ROS:  General: Negative for anorexia, weight loss, fever, chills, fatigue, weakness. ENT: Negative for hoarseness, difficulty swallowing , nasal congestion. CV: Negative for chest pain, angina, palpitations, dyspnea on exertion, peripheral edema.  Respiratory: Negative for dyspnea at rest, dyspnea on exertion, cough, sputum, wheezing.  GI: See history of present illness. GU:  Negative for dysuria, hematuria, urinary incontinence, urinary frequency, nocturnal urination.  Endo: Negative for unusual weight change.    Physical Examination:   BP 106/66   Pulse 89   Temp 97.8 F (36.6 C) (Oral)   Ht 5\' 7"  (1.702 m)   Wt 139 lb 6.4 oz (63.2 kg)   BMI 21.83 kg/m   General: Well-nourished, well-developed in no acute distress.  Eyes: No icterus. Conjunctivae pink. Neuro: Alert and oriented x 3.  Grossly intact. Skin: Warm and dry, no jaundice.   Psych: Alert and cooperative, normal mood and affect.   Imaging Studies: No results found.  Assessment and Plan:   Eric Simon is a 70 y.o. y/o male with a history of colorectal cancer on treatment was seen by myself in May 2022 for recurrent rectal bleeding.  Sigmoidoscopy showed no evidence of tumor but large internal hemorrhoids.  Plan was to band internal hemorrhoids but he had an anal fissure , we treated that with conservative management and since then he has had absolutely no rectal bleeding.  Today he has decided he does not wish to pursue banding at this point of time since he is not having any symptoms which seems reasonable.  I have explained to him that he should continue conservative management of the anal fissure as well as internal hemorrhoids which would include a high-fiber diet with a aim of 25 to 30 g of fiber per day, perianal hygiene as instructed, sitz bath.  He is well aware that if the rectal bleeding recurs he has to call  my office right away and we will set him up for banding.  Usually with conservative management hemorrhoids remain asymptomatic and up to 50% of patients    Dr Eric Bellows  MD,MRCP [facility]) Follow up in as needed

## 2021-06-08 ENCOUNTER — Inpatient Hospital Stay: Payer: Medicare HMO

## 2021-06-08 ENCOUNTER — Encounter: Payer: Self-pay | Admitting: Oncology

## 2021-06-08 ENCOUNTER — Inpatient Hospital Stay (HOSPITAL_BASED_OUTPATIENT_CLINIC_OR_DEPARTMENT_OTHER): Payer: Medicare HMO | Admitting: Oncology

## 2021-06-08 ENCOUNTER — Other Ambulatory Visit: Payer: Self-pay

## 2021-06-08 VITALS — BP 112/65 | HR 84 | Temp 97.2°F | Resp 18 | Wt 136.0 lb

## 2021-06-08 VITALS — BP 122/70 | HR 73 | Temp 96.9°F | Resp 16

## 2021-06-08 DIAGNOSIS — R059 Cough, unspecified: Secondary | ICD-10-CM | POA: Diagnosis not present

## 2021-06-08 DIAGNOSIS — Z5111 Encounter for antineoplastic chemotherapy: Secondary | ICD-10-CM

## 2021-06-08 DIAGNOSIS — C189 Malignant neoplasm of colon, unspecified: Secondary | ICD-10-CM

## 2021-06-08 DIAGNOSIS — Z452 Encounter for adjustment and management of vascular access device: Secondary | ICD-10-CM | POA: Diagnosis not present

## 2021-06-08 DIAGNOSIS — C772 Secondary and unspecified malignant neoplasm of intra-abdominal lymph nodes: Secondary | ICD-10-CM | POA: Diagnosis not present

## 2021-06-08 DIAGNOSIS — Z5112 Encounter for antineoplastic immunotherapy: Secondary | ICD-10-CM

## 2021-06-08 DIAGNOSIS — T451X5A Adverse effect of antineoplastic and immunosuppressive drugs, initial encounter: Secondary | ICD-10-CM

## 2021-06-08 DIAGNOSIS — I959 Hypotension, unspecified: Secondary | ICD-10-CM | POA: Diagnosis not present

## 2021-06-08 DIAGNOSIS — C187 Malignant neoplasm of sigmoid colon: Secondary | ICD-10-CM | POA: Diagnosis not present

## 2021-06-08 DIAGNOSIS — G620 Drug-induced polyneuropathy: Secondary | ICD-10-CM

## 2021-06-08 DIAGNOSIS — K5900 Constipation, unspecified: Secondary | ICD-10-CM | POA: Diagnosis not present

## 2021-06-08 LAB — CBC WITH DIFFERENTIAL/PLATELET
Abs Immature Granulocytes: 0.04 10*3/uL (ref 0.00–0.07)
Basophils Absolute: 0.1 10*3/uL (ref 0.0–0.1)
Basophils Relative: 1 %
Eosinophils Absolute: 0.1 10*3/uL (ref 0.0–0.5)
Eosinophils Relative: 1 %
HCT: 35 % — ABNORMAL LOW (ref 39.0–52.0)
Hemoglobin: 11.7 g/dL — ABNORMAL LOW (ref 13.0–17.0)
Immature Granulocytes: 0 %
Lymphocytes Relative: 19 %
Lymphs Abs: 1.9 10*3/uL (ref 0.7–4.0)
MCH: 30.4 pg (ref 26.0–34.0)
MCHC: 33.4 g/dL (ref 30.0–36.0)
MCV: 90.9 fL (ref 80.0–100.0)
Monocytes Absolute: 0.7 10*3/uL (ref 0.1–1.0)
Monocytes Relative: 7 %
Neutro Abs: 7.2 10*3/uL (ref 1.7–7.7)
Neutrophils Relative %: 72 %
Platelets: 115 10*3/uL — ABNORMAL LOW (ref 150–400)
RBC: 3.85 MIL/uL — ABNORMAL LOW (ref 4.22–5.81)
RDW: 16 % — ABNORMAL HIGH (ref 11.5–15.5)
WBC: 10 10*3/uL (ref 4.0–10.5)
nRBC: 0 % (ref 0.0–0.2)

## 2021-06-08 LAB — COMPREHENSIVE METABOLIC PANEL
ALT: 25 U/L (ref 0–44)
AST: 43 U/L — ABNORMAL HIGH (ref 15–41)
Albumin: 3.8 g/dL (ref 3.5–5.0)
Alkaline Phosphatase: 127 U/L — ABNORMAL HIGH (ref 38–126)
Anion gap: 8 (ref 5–15)
BUN: 24 mg/dL — ABNORMAL HIGH (ref 8–23)
CO2: 24 mmol/L (ref 22–32)
Calcium: 8.7 mg/dL — ABNORMAL LOW (ref 8.9–10.3)
Chloride: 100 mmol/L (ref 98–111)
Creatinine, Ser: 0.98 mg/dL (ref 0.61–1.24)
GFR, Estimated: >60 mL/min (ref >60)
Glucose, Bld: 95 mg/dL (ref 70–99)
Potassium: 3.9 mmol/L (ref 3.5–5.1)
Sodium: 132 mmol/L — ABNORMAL LOW (ref 135–145)
Total Bilirubin: 0.8 mg/dL (ref 0.3–1.2)
Total Protein: 7.2 g/dL (ref 6.5–8.1)

## 2021-06-08 LAB — PROTEIN, URINE, RANDOM: Total Protein, Urine: 12 mg/dL

## 2021-06-08 MED ORDER — SODIUM CHLORIDE 0.9 % IV SOLN
7.5000 mg/kg | Freq: Once | INTRAVENOUS | Status: AC
  Administered 2021-06-08: 500 mg via INTRAVENOUS
  Filled 2021-06-08: qty 16

## 2021-06-08 MED ORDER — SODIUM CHLORIDE 0.9 % IV SOLN
700.0000 mg | Freq: Once | INTRAVENOUS | Status: AC
  Administered 2021-06-08: 700 mg via INTRAVENOUS
  Filled 2021-06-08: qty 35

## 2021-06-08 MED ORDER — FLUOROURACIL CHEMO INJECTION 2.5 GM/50ML
400.0000 mg/m2 | Freq: Once | INTRAVENOUS | Status: AC
  Administered 2021-06-08: 700 mg via INTRAVENOUS
  Filled 2021-06-08: qty 14

## 2021-06-08 MED ORDER — DEXAMETHASONE SODIUM PHOSPHATE 10 MG/ML IJ SOLN
10.0000 mg | Freq: Once | INTRAMUSCULAR | Status: AC
  Administered 2021-06-08: 10 mg via INTRAVENOUS
  Filled 2021-06-08: qty 1

## 2021-06-08 MED ORDER — SODIUM CHLORIDE 0.9 % IV SOLN
2400.0000 mg/m2 | INTRAVENOUS | Status: DC
  Administered 2021-06-08: 4300 mg via INTRAVENOUS
  Filled 2021-06-08: qty 86

## 2021-06-08 MED ORDER — SODIUM CHLORIDE 0.9 % IV SOLN
Freq: Once | INTRAVENOUS | Status: AC
  Filled 2021-06-08: qty 250

## 2021-06-08 NOTE — Progress Notes (Signed)
Hematology/Oncology Consult note [facility]  [phone] [phone]  Patient Care Team: Jodi Marble, MD as PCP - General (Internal Medicine) Clent Jacks, RN as Oncology Nurse Navigator Sindy Guadeloupe, MD as Consulting Physician (Hematology and Oncology)   Name of the patient: Eric Simon  [id number]  Oct 25, 1951   Date of visit: 06/08/21  Diagnosis- metastatic colon cancer with lymph node metastases K-ras/BRAF wild-type  Chief complaint/ Reason for visit-on treatment assessment prior to cycle 38 of 5-FU Mvasi chemotherapy  Heme/Onc history: Patient is a 70 yr old male with >40 pack year history of smoking. He currently smokes 0.5ppd. he presented to the ER with symptoms of left sided chest pain and left arm pain. Troponin was negative ekg was unremarkable. Ct chest showed no PE. He was incidentally noted to have mediastinal and retrocrural adenopathy and a 2.1X2.3X4.4 cm retroaortic soft tissue lesion in the posterior left chest. 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  Low-grade activity in the left lateral fifth and sixth ribs associated with nondisplaced fractures likely reflecting healing response problem malignancy.   Patient underwent CT-guided biopsy of the retroperitoneal lymph node pathology showed metastatic adenocarcinoma compatible with colorectal origin.  CK7 negative.  CK20 positive.  CDX 2+.  TTF-1 negative.  PSA negative.  This pattern of immunoreactivity supports the above diagnosis. Patient underwent colonoscopy which showed sigmoid mass that was consistent with adenocarcinoma.  RAS panel testing showed that he was wild-type for both K-ras and BRAF   FOLFOX and bevacizumab chemotherapy started in July 2020.  Subsequently oxaliplatin has been on hold given neuropathy.  Recent scans have shown slow  increase in the size of intra-abdominal adenopathy but patient continues to have low-volume disease.Irinotecan has not been added to the regimen yet.    Interval history-patient is concerned about his ongoing weight loss despite having a good appetite and eating regular meals.In the last 3 weeks patient has lost about 5 pounds.  Neuropathy is presently stable  ECOG PS- 1 Pain scale- 0  Review of systems- Review of Systems  Constitutional:  Positive for malaise/fatigue and weight loss. Negative for chills and fever.  HENT:  Negative for congestion, ear discharge and nosebleeds.   Eyes:  Negative for blurred vision.  Respiratory:  Negative for cough, hemoptysis, sputum production, shortness of breath and wheezing.   Cardiovascular:  Negative for chest pain, palpitations, orthopnea and claudication.  Gastrointestinal:  Negative for abdominal pain, blood in stool, constipation, diarrhea, heartburn, melena, nausea and vomiting.  Genitourinary:  Negative for dysuria, flank pain, frequency, hematuria and urgency.  Musculoskeletal:  Negative for back pain, joint pain and myalgias.  Skin:  Negative for rash.  Neurological:  Positive for sensory change (Peripheral neuropathy). Negative for dizziness, tingling, focal weakness, seizures, weakness and headaches.  Endo/Heme/Allergies:  Does not bruise/bleed easily.  Psychiatric/Behavioral:  Negative for depression and suicidal ideas. The patient does not have insomnia.      No Known Allergies   Past Medical History:  Diagnosis Date   Colon cancer ([place])    COPD (chronic obstructive pulmonary disease) ([place])    Hyperlipidemia      Past Surgical History:  Procedure Laterality Date   COLONOSCOPY WITH PROPOFOL N/A 06/26/2019   Procedure: COLONOSCOPY WITH PROPOFOL;  Surgeon: [name], MD;  Location: [facility] ENDOSCOPY;  Service: Gastroenterology;  Laterality: N/A;   FLEXIBLE SIGMOIDOSCOPY N/A 05/07/2021  Procedure: FLEXIBLE SIGMOIDOSCOPY;  Surgeon:  [name], MD;  Location: [facility] ENDOSCOPY;  Service: Gastroenterology;  Laterality: N/A;   PORTA CATH INSERTION N/A 06/25/2019   Procedure: PORTA CATH INSERTION;  Surgeon: Algernon Huxley, MD;  Location: [place] CV LAB;  Service: Cardiovascular;  Laterality: N/A;   PORTA CATH INSERTION Left 08/11/2020   Procedure: PORTA CATH INSERTION;  Surgeon: Algernon Huxley, MD;  Location: [place] CV LAB;  Service: Cardiovascular;  Laterality: Left;   PORTA CATH REMOVAL Right 08/11/2020   Procedure: PORTA CATH REMOVAL;  Surgeon: Algernon Huxley, MD;  Location: [place] CV LAB;  Service: Cardiovascular;  Laterality: Right;    Social History   Socioeconomic History   Marital status: Single    Spouse name: Not on file   Number of children: Not on file   Years of education: Not on file   Highest education level: Not on file  Occupational History   Not on file  Tobacco Use   Smoking status: Every Day    Packs/day: 0.50    Years: 40.00    Pack years: 20.00    Types: Cigarettes   Smokeless tobacco: Never   Tobacco comments:    smoking 40 years  Vaping Use   Vaping Use: Never used  Substance and Sexual Activity   Alcohol use: No   Drug use: No   Sexual activity: Not Currently  Other Topics Concern   Not on file  Social History Narrative   Not on file   Social Determinants of Health   Financial Resource Strain: Not on file  Food Insecurity: Not on file  Transportation Needs: Not on file  Physical Activity: Not on file  Stress: Not on file  Social Connections: Not on file  Intimate Partner Violence: Not on file    Family History  Problem Relation Age of Onset   Brain cancer Father      Current Outpatient Medications:    aspirin EC 81 MG tablet, Take 81 mg by mouth daily., Disp: , Rfl:    azelastine (ASTELIN) 0.1 % nasal spray, Place 1 spray into both nostrils 1 day or 1 dose., Disp: , Rfl:    butalbital-acetaminophen-caffeine (FIORICET) 50-325-40 MG tablet, Take 1 tablet by  mouth every 4 (four) hours as needed., Disp: , Rfl:    cetirizine (ZYRTEC) 10 MG tablet, Take 1 tablet (10 mg total) by mouth daily., Disp: 30 tablet, Rfl: 0   chlorthalidone (HYGROTON) 25 MG tablet, Take 1 tablet by mouth daily., Disp: , Rfl:    dexamethasone (DECADRON) 4 MG tablet, Take 2 tablets (8 mg total) by mouth daily. Start the day after chemotherapy for 2 days. Take with food., Disp: 30 tablet, Rfl: 1   DEXILANT 60 MG capsule, Take 1 capsule by mouth 1 day or 1 dose., Disp: , Rfl:    DULoxetine (CYMBALTA) 30 MG capsule, Take 1 capsule (30 mg total) by mouth 2 (two) times daily., Disp: 60 capsule, Rfl: 1   fluticasone (FLONASE) 50 MCG/ACT nasal spray, Place 2 sprays into both nostrils 1 day or 1 dose. , Disp: , Rfl:    fluticasone (VERAMYST) 27.5 MCG/SPRAY nasal spray, Place 2 sprays into the nose daily., Disp: , Rfl:    lactulose (CHRONULAC) 10 GM/15ML solution, Take 30 mLs (20 g total) by mouth 3 (three) times daily., Disp: 8100 mL, Rfl: 1   lidocaine-prilocaine (EMLA) cream, Apply to affected area once, Disp: 30 g, Rfl: 3   magic mouthwash w/lidocaine SOLN, Take  5-10 mLs by mouth 4 (four) times daily as needed for mouth pain., Disp: 480 mL, Rfl: 3   montelukast (SINGULAIR) 10 MG tablet, Take 10 mg by mouth at bedtime., Disp: , Rfl:    nicotine (NICODERM CQ - DOSED IN MG/24 HOURS) 21 mg/24hr patch, Place 1 patch onto the skin 1 day or 1 dose., Disp: , Rfl:    ondansetron (ZOFRAN) 8 MG tablet, Take 1 tablet (8 mg total) by mouth 2 (two) times daily as needed for refractory nausea / vomiting. Start on day 3 after chemotherapy., Disp: 30 tablet, Rfl: 1   oxyCODONE (OXY IR/ROXICODONE) 5 MG immediate release tablet, Take 1 tablet (5 mg total) by mouth every 6 (six) hours as needed for severe pain., Disp: 30 tablet, Rfl: 0   pantoprazole (PROTONIX) 40 MG tablet, Take 40 mg by mouth daily. , Disp: , Rfl:    potassium chloride SA (K-DUR) 20 MEQ tablet, Take 1 tablet (20 mEq total) by mouth daily.,  Disp: 14 tablet, Rfl: 0   pregabalin (LYRICA) 75 MG capsule, Take 1 capsule (75 mg total) by mouth 2 (two) times daily., Disp: 60 capsule, Rfl: 2   prochlorperazine (COMPAZINE) 10 MG tablet, Take 1 tablet (10 mg total) by mouth every 6 (six) hours as needed (Nausea or vomiting)., Disp: 30 tablet, Rfl: 1   simvastatin (ZOCOR) 20 MG tablet, Take 20 mg by mouth daily. , Disp: , Rfl:    SYMBICORT 80-4.5 MCG/ACT inhaler, Inhale 2 puffs into the lungs 1 day or 1 dose. , Disp: , Rfl:    tamsulosin (FLOMAX) 0.4 MG CAPS capsule, Take 0.4 mg by mouth. , Disp: , Rfl:  No current facility-administered medications for this visit.  Facility-Administered Medications Ordered in Other Visits:    fluorouracil (ADRUCIL) 4,300 mg in sodium chloride 0.9 % 64 mL chemo infusion, 2,400 mg/m2 (Treatment Plan Recorded), Intravenous, 1 day or 1 dose, Sindy Guadeloupe, MD, 4,300 mg at 06/08/21 1122   sodium chloride flush (NS) 0.9 % injection 10 mL, 10 mL, Intracatheter, PRN, Sindy Guadeloupe, MD, 10 mL at 08/15/19 1405  Physical exam:  Vitals:   06/08/21 0846  BP: 112/65  Pulse: 84  Resp: 18  Temp: (!) 97.2 F (36.2 C)  TempSrc: Tympanic  SpO2: 100%  Weight: 136 lb (61.7 kg)   Physical Exam Constitutional:      General: He is not in acute distress. Cardiovascular:     Rate and Rhythm: Normal rate and regular rhythm.     Heart sounds: Normal heart sounds.  Pulmonary:     Effort: Pulmonary effort is normal.     Breath sounds: Normal breath sounds.  Abdominal:     General: Bowel sounds are normal.     Palpations: Abdomen is soft.  Skin:    General: Skin is warm and dry.  Neurological:     Mental Status: He is alert and oriented to person, place, and time.     CMP Latest Ref Rng & Units 06/08/2021  Glucose 70 - 99 mg/dL 95  BUN 8 - 23 mg/dL 24(H)  Creatinine 0.61 - 1.24 mg/dL 0.98  Sodium 135 - 145 mmol/L 132(L)  Potassium 3.5 - 5.1 mmol/L 3.9  Chloride 98 - 111 mmol/L 100  CO2 22 - 32 mmol/L 24   Calcium 8.9 - 10.3 mg/dL 8.7(L)  Total Protein 6.5 - 8.1 g/dL 7.2  Total Bilirubin 0.3 - 1.2 mg/dL 0.8  Alkaline Phos 38 - 126 U/L 127(H)  AST 15 -  41 U/L 43(H)  ALT 0 - 44 U/L 25   CBC Latest Ref Rng & Units 06/08/2021  WBC 4.0 - 10.5 K/uL 10.0  Hemoglobin 13.0 - 17.0 g/dL 11.7(L)  Hematocrit 39.0 - 52.0 % 35.0(L)  Platelets 150 - 400 K/uL 115(L)    Assessment and plan- Patient is a 70 y.o. male with metastatic colon cancer here for on treatment assessment prior to cycle 27 of 5-FU Mvasi chemotherapy  Counts okay to proceed with cycle 37 of 5-FU Mvasi chemotherapy today.  Pump DC on day 3 and gets Udenyca.  Return to clinic in 3 weeks for cycle 38.  Plan to repeat CT chest abdomen and pelvis with contrast in 4 to 5 weeks time.  Unintentional weight loss:Last scan in April 2022 did not show significant progression of disease in his intra-abdominal lymph nodes have been gradually increasing in size but remain nonboggy.  Patient reports that he is eating well and his appetite is good.  Have encouraged him to try protein drinks in addition to his regular diet.  Chemo induced peripheral neuropathy: Continue Cymbalta  Hypotension: Patient reports that his blood pressures are between systolic 656-8 20 at home.  He is currently on chlorthalidone half a tablet daily and have asked him to hold off on taking that and keep a tab of his blood pressures at home.  Rectal bleeding: Likely secondary to anal fissure.  Currently resolved after treatment of anal fissure.  He follows up with GI.   Visit Diagnosis 1. 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 ([place])   2. Encounter for monoclonal antibody treatment for malignancy   3. Encounter for antineoplastic chemotherapy   4. Chemotherapy-induced peripheral neuropathy ([place])      Dr. Randa Evens, MD, MPH [facility] [phone] 06/08/2021 12:37 PM

## 2021-06-08 NOTE — Patient Instructions (Signed)
[facility]  Discharge Instructions: Thank you for choosing [facility] to provide your oncology and hematology care.  If you have a lab appointment with the [facility], please go directly to the [facility] and check in at the registration area.  Wear comfortable clothing and clothing appropriate for easy access to any Portacath or PICC line.   We strive to give you quality time with your provider. You may need to reschedule your appointment if you arrive late (15 or more minutes).  Arriving late affects you and other patients whose appointments are after yours.  Also, if you miss three or more appointments without notifying the office, you may be dismissed from the clinic at the provider's discretion.      For prescription refill requests, have your pharmacy contact our office and allow 72 hours for refills to be completed.    Today you received the following chemotherapy and/or immunotherapy agents bevacizumab, leucovorin, fluorouracilFluorouracil, 5-FU injection What is this medication? FLUOROURACIL, 5-FU (flure oh YOOR a sil) is a chemotherapy drug. It slows the growth of cancer cells. This medicine is used to treat many types of cancer like breast cancer, colon or rectal cancer, pancreatic cancer, and stomachcancer. This medicine may be used for other purposes; ask your health care provider orpharmacist if you have questions. COMMON BRAND NAME(S): Adrucil What should I tell my care team before I take this medication? They need to know if you have any of these conditions: blood disorders dihydropyrimidine dehydrogenase (DPD) deficiency infection (especially a virus infection such as chickenpox, cold sores, or herpes) kidney disease liver disease malnourished, poor nutrition recent or ongoing radiation therapy an unusual or allergic reaction to fluorouracil, other chemotherapy, other medicines, foods, dyes, or preservatives pregnant  or trying to get pregnant breast-feeding How should I use this medication? This drug is given as an infusion or injection into a vein. It is administeredin a hospital or clinic by a specially trained health care professional. Talk to your pediatrician regarding the use of this medicine in children.Special care may be needed. Overdosage: If you think you have taken too much of this medicine contact apoison control center or emergency room at once. NOTE: This medicine is only for you. Do not share this medicine with others. What if I miss a dose? It is important not to miss your dose. Call your doctor or health careprofessional if you are unable to keep an appointment. What may interact with this medication? Do not take this medicine with any of the following medications: live virus vaccines This medicine may also interact with the following medications: medicines that treat or prevent blood clots like warfarin, enoxaparin, and dalteparin This list may not describe all possible interactions. Give your health care provider a list of all the medicines, herbs, non-prescription drugs, or dietary supplements you use. Also tell them if you smoke, drink alcohol, or use illegaldrugs. Some items may interact with your medicine. What should I watch for while using this medication? Visit your doctor for checks on your progress. This drug may make you feel generally unwell. This is not uncommon, as chemotherapy can affect healthy cells as well as cancer cells. Report any side effects. Continue your course oftreatment even though you feel ill unless your doctor tells you to stop. In some cases, you may be given additional medicines to help with side effects.Follow all directions for their use. Call your doctor or health care professional for advice if you get a  fever, chills or sore throat, or other symptoms of a cold or flu. Do not treat yourself. This drug decreases your body's ability to fight infections. Try  toavoid being around people who are sick. This medicine may increase your risk to bruise or bleed. Call your doctor orhealth care professional if you notice any unusual bleeding. Be careful brushing and flossing your teeth or using a toothpick because you may get an infection or bleed more easily. If you have any dental work done,tell your dentist you are receiving this medicine. Avoid taking products that contain aspirin, acetaminophen, ibuprofen, naproxen, or ketoprofen unless instructed by your doctor. These medicines may hide afever. Do not become pregnant while taking this medicine. Women should inform their doctor if they wish to become pregnant or think they might be pregnant. There is a potential for serious side effects to an unborn child. Talk to your health care professional or pharmacist for more information. Do not breast-feed aninfant while taking this medicine. Men should inform their doctor if they wish to father a child. This medicinemay lower sperm counts. Do not treat diarrhea with over the counter products. Contact your doctor ifyou have diarrhea that lasts more than 2 days or if it is severe and watery. This medicine can make you more sensitive to the sun. Keep out of the sun. If you cannot avoid being in the sun, wear protective clothing and use sunscreen.Do not use sun lamps or tanning beds/booths. What side effects may I notice from receiving this medication? Side effects that you should report to your doctor or health care professionalas soon as possible: allergic reactions like skin rash, itching or hives, swelling of the face, lips, or tongue low blood counts - this medicine may decrease the number of white blood cells, red blood cells and platelets. You may be at increased risk for infections and bleeding. signs of infection - fever or chills, cough, sore throat, pain or difficulty passing urine signs of decreased platelets or bleeding - bruising, pinpoint red spots on the  skin, black, tarry stools, blood in the urine signs of decreased red blood cells - unusually weak or tired, fainting spells, lightheadedness breathing problems changes in vision chest pain mouth sores nausea and vomiting pain, swelling, redness at site where injected pain, tingling, numbness in the hands or feet redness, swelling, or sores on hands or feet stomach pain unusual bleeding Side effects that usually do not require medical attention (report to yourdoctor or health care professional if they continue or are bothersome): changes in finger or toe nails diarrhea dry or itchy skin hair loss headache loss of appetite sensitivity of eyes to the light stomach upset unusually teary eyes This list may not describe all possible side effects. Call your doctor for medical advice about side effects. You may report side effects to FDA at1-800-FDA-1088. Where should I keep my medication? This drug is given in a hospital or clinic and will not be stored at home. NOTE: This sheet is a summary. It may not cover all possible information. If you have questions about this medicine, talk to your doctor, pharmacist, orhealth care provider.  2022 Elsevier/Gold Standard (2019-10-30 15:00:03) Leucovorin injection What is this medication? LEUCOVORIN (loo koe VOR in) is used to prevent or treat the harmful effects of some medicines. This medicine is used to treat anemia caused by a low amount of folic acid in the body. It is also used with 5-fluorouracil (5-FU) to treatcolon cancer. This medicine may be used for other  purposes; ask your health care provider orpharmacist if you have questions. What should I tell my care team before I take this medication? They need to know if you have any of these conditions: anemia from low levels of vitamin B-12 in the blood an unusual or allergic reaction to leucovorin, folic acid, other medicines, foods, dyes, or preservatives pregnant or trying to get  pregnant breast-feeding How should I use this medication? This medicine is for injection into a muscle or into a vein. It is given by ahealth care professional in a hospital or clinic setting. Talk to your pediatrician regarding the use of this medicine in children.Special care may be needed. Overdosage: If you think you have taken too much of this medicine contact apoison control center or emergency room at once. NOTE: This medicine is only for you. Do not share this medicine with others. What if I miss a dose? This does not apply. What may interact with this medication? capecitabine fluorouracil phenobarbital phenytoin primidone trimethoprim-sulfamethoxazole This list may not describe all possible interactions. Give your health care provider a list of all the medicines, herbs, non-prescription drugs, or dietary supplements you use. Also tell them if you smoke, drink alcohol, or use illegaldrugs. Some items may interact with your medicine. What should I watch for while using this medication? Your condition will be monitored carefully while you are receiving thismedicine. This medicine may increase the side effects of 5-fluorouracil, 5-FU. Tell your doctor or health care professional if you have diarrhea or mouth sores that donot get better or that get worse. What side effects may I notice from receiving this medication? Side effects that you should report to your doctor or health care professionalas soon as possible: allergic reactions like skin rash, itching or hives, swelling of the face, lips, or tongue breathing problems fever, infection mouth sores unusual bleeding or bruising unusually weak or tired Side effects that usually do not require medical attention (report to yourdoctor or health care professional if they continue or are bothersome): constipation or diarrhea loss of appetite nausea, vomiting This list may not describe all possible side effects. Call your doctor for  medical advice about side effects. You may report side effects to FDA at1-800-FDA-1088. Where should I keep my medication? This drug is given in a hospital or clinic and will not be stored at home. NOTE: This sheet is a summary. It may not cover all possible information. If you have questions about this medicine, talk to your doctor, pharmacist, orhealth care provider.  2022 Elsevier/Gold Standard (2008-06-04 16:50:29) Bevacizumab injection What is this medication? BEVACIZUMAB (be va SIZ yoo mab) is a monoclonal antibody. It is used to treatmany types of cancer. This medicine may be used for other purposes; ask your health care provider orpharmacist if you have questions. COMMON BRAND NAME(S): Avastin, MVASI, Noah Charon What should I tell my care team before I take this medication? They need to know if you have any of these conditions: diabetes heart disease high blood pressure history of coughing up blood prior anthracycline chemotherapy (e.g., doxorubicin, daunorubicin, epirubicin) recent or ongoing radiation therapy recent or planning to have surgery stroke an unusual or allergic reaction to bevacizumab, hamster proteins, mouse proteins, other medicines, foods, dyes, or preservatives pregnant or trying to get pregnant breast-feeding How should I use this medication? This medicine is for infusion into a vein. It is given by a health careprofessional in a hospital or clinic setting. Talk to your pediatrician regarding the use of this medicine in children.Special  care may be needed. Overdosage: If you think you have taken too much of this medicine contact apoison control center or emergency room at once. NOTE: This medicine is only for you. Do not share this medicine with others. What if I miss a dose? It is important not to miss your dose. Call your doctor or health careprofessional if you are unable to keep an appointment. What may interact with this medication? Interactions are not  expected. This list may not describe all possible interactions. Give your health care provider a list of all the medicines, herbs, non-prescription drugs, or dietary supplements you use. Also tell them if you smoke, drink alcohol, or use illegaldrugs. Some items may interact with your medicine. What should I watch for while using this medication? Your condition will be monitored carefully while you are receiving this medicine. You will need important blood work and urine testing done while youare taking this medicine. This medicine may increase your risk to bruise or bleed. Call your doctor orhealth care professional if you notice any unusual bleeding. Before having surgery, talk to your health care provider to make sure it is ok. This drug can increase the risk of poor healing of your surgical site or wound. You will need to stop this drug for 28 days before surgery. After surgery, wait at least 28 days before restarting this drug. Make sure the surgical site or wound is healed enough before restarting this drug. Talk to your health careprovider if questions. Do not become pregnant while taking this medicine or for 6 months after stopping it. Women should inform their doctor if they wish to become pregnant or think they might be pregnant. There is a potential for serious side effects to an unborn child. Talk to your health care professional or pharmacist for more information. Do not breast-feed an infant while taking this medicine andfor 6 months after the last dose. This medicine has caused ovarian failure in some women. This medicine may interfere with the ability to have a child. You should talk to your doctor orhealth care professional if you are concerned about your fertility. What side effects may I notice from receiving this medication? Side effects that you should report to your doctor or health care professionalas soon as possible: allergic reactions like skin rash, itching or hives, swelling of  the face, lips, or tongue chest pain or chest tightness chills coughing up blood high fever seizures severe constipation signs and symptoms of bleeding such as bloody or black, tarry stools; red or dark-brown urine; spitting up blood or brown material that looks like coffee grounds; red spots on the skin; unusual bruising or bleeding from the eye, gums, or nose signs and symptoms of a blood clot such as breathing problems; chest pain; severe, sudden headache; pain, swelling, warmth in the leg signs and symptoms of a stroke like changes in vision; confusion; trouble speaking or understanding; severe headaches; sudden numbness or weakness of the face, arm or leg; trouble walking; dizziness; loss of balance or coordination stomach pain sweating swelling of legs or ankles vomiting weight gain Side effects that usually do not require medical attention (report to yourdoctor or health care professional if they continue or are bothersome): back pain changes in taste decreased appetite dry skin nausea tiredness This list may not describe all possible side effects. Call your doctor for medical advice about side effects. You may report side effects to FDA at1-800-FDA-1088. Where should I keep my medication? This drug is given in a hospital  or clinic and will not be stored at home. NOTE: This sheet is a summary. It may not cover all possible information. If you have questions about this medicine, talk to your doctor, pharmacist, orhealth care provider.  2022 Elsevier/Gold Standard (2019-09-26 10:50:46)       To help prevent nausea and vomiting after your treatment, we encourage you to take your nausea medication as directed.  BELOW ARE SYMPTOMS THAT SHOULD BE REPORTED IMMEDIATELY: *FEVER GREATER THAN 100.4 F (38 C) OR HIGHER *CHILLS OR SWEATING *NAUSEA AND VOMITING THAT IS NOT CONTROLLED WITH YOUR NAUSEA MEDICATION *UNUSUAL SHORTNESS OF BREATH *UNUSUAL BRUISING OR BLEEDING *URINARY  PROBLEMS (pain or burning when urinating, or frequent urination) *BOWEL PROBLEMS (unusual diarrhea, constipation, pain near the anus) TENDERNESS IN MOUTH AND THROAT WITH OR WITHOUT PRESENCE OF ULCERS (sore throat, sores in mouth, or a toothache) UNUSUAL RASH, SWELLING OR PAIN  UNUSUAL VAGINAL DISCHARGE OR ITCHING   Items with * indicate a potential emergency and should be followed up as soon as possible or go to the Emergency Department if any problems should occur.  Please show the CHEMOTHERAPY ALERT CARD or IMMUNOTHERAPY ALERT CARD at check-in to the Emergency Department and triage nurse.  Should you have questions after your visit or need to cancel or reschedule your appointment, please contact [facility]  [phone] and follow the prompts.  Office hours are 8:00 a.m. to 4:30 p.m. Monday - Friday. Please note that voicemails left after 4:00 p.m. may not be returned until the following business day.  We are closed weekends and major holidays. You have access to a nurse at all times for urgent questions. Please call the main number to the clinic [phone] and follow the prompts.  For any non-urgent questions, you may also contact your provider using MyChart. We now offer e-Visits for anyone 73 and older to request care online for non-urgent symptoms. For details visit [email or web address].   Also download the MyChart app! Go to the app store, search "MyChart", open the app, select [facility], and log in with your MyChart username and password.  Due to Covid, a mask is required upon entering the hospital/clinic. If you do not have a mask, one will be given to you upon arrival. For doctor visits, patients may have 1 support person aged 85 or older with them. For treatment visits, patients cannot have anyone with them due to current Covid guidelines and our immunocompromised population.

## 2021-06-08 NOTE — Progress Notes (Signed)
Patient here for oncology follow-up appointment, expresses no complaints or concerns at this time.    

## 2021-06-10 ENCOUNTER — Inpatient Hospital Stay: Payer: Medicare HMO

## 2021-06-10 ENCOUNTER — Other Ambulatory Visit: Payer: Self-pay

## 2021-06-10 VITALS — BP 132/68 | HR 77 | Temp 97.8°F | Resp 20

## 2021-06-10 DIAGNOSIS — C189 Malignant neoplasm of colon, unspecified: Secondary | ICD-10-CM

## 2021-06-10 DIAGNOSIS — K5900 Constipation, unspecified: Secondary | ICD-10-CM | POA: Diagnosis not present

## 2021-06-10 DIAGNOSIS — G620 Drug-induced polyneuropathy: Secondary | ICD-10-CM | POA: Diagnosis not present

## 2021-06-10 DIAGNOSIS — I959 Hypotension, unspecified: Secondary | ICD-10-CM | POA: Diagnosis not present

## 2021-06-10 DIAGNOSIS — R059 Cough, unspecified: Secondary | ICD-10-CM | POA: Diagnosis not present

## 2021-06-10 DIAGNOSIS — Z5111 Encounter for antineoplastic chemotherapy: Secondary | ICD-10-CM | POA: Diagnosis not present

## 2021-06-10 DIAGNOSIS — Z452 Encounter for adjustment and management of vascular access device: Secondary | ICD-10-CM | POA: Diagnosis not present

## 2021-06-10 DIAGNOSIS — C772 Secondary and unspecified malignant neoplasm of intra-abdominal lymph nodes: Secondary | ICD-10-CM | POA: Diagnosis not present

## 2021-06-10 DIAGNOSIS — C187 Malignant neoplasm of sigmoid colon: Secondary | ICD-10-CM | POA: Diagnosis not present

## 2021-06-10 DIAGNOSIS — Z5112 Encounter for antineoplastic immunotherapy: Secondary | ICD-10-CM | POA: Diagnosis not present

## 2021-06-10 MED ORDER — PEGFILGRASTIM-CBQV 6 MG/0.6ML ~~LOC~~ SOSY
6.0000 mg | PREFILLED_SYRINGE | Freq: Once | SUBCUTANEOUS | Status: AC
  Administered 2021-06-10: 6 mg via SUBCUTANEOUS
  Filled 2021-06-10: qty 0.6

## 2021-06-10 MED ORDER — SODIUM CHLORIDE 0.9% FLUSH
10.0000 mL | INTRAVENOUS | Status: DC | PRN
  Administered 2021-06-10: 10 mL
  Filled 2021-06-10: qty 10

## 2021-06-10 MED ORDER — HEPARIN SOD (PORK) LOCK FLUSH 100 UNIT/ML IV SOLN
INTRAVENOUS | Status: AC
  Filled 2021-06-10: qty 5

## 2021-06-10 MED ORDER — HEPARIN SOD (PORK) LOCK FLUSH 100 UNIT/ML IV SOLN
500.0000 [IU] | Freq: Once | INTRAVENOUS | Status: AC | PRN
  Administered 2021-06-10: 500 [IU]
  Filled 2021-06-10: qty 5

## 2021-06-29 ENCOUNTER — Inpatient Hospital Stay: Payer: Medicare HMO | Attending: Oncology

## 2021-06-29 ENCOUNTER — Other Ambulatory Visit: Payer: Self-pay

## 2021-06-29 ENCOUNTER — Inpatient Hospital Stay: Payer: Medicare HMO

## 2021-06-29 ENCOUNTER — Inpatient Hospital Stay (HOSPITAL_BASED_OUTPATIENT_CLINIC_OR_DEPARTMENT_OTHER): Payer: Medicare HMO | Admitting: Oncology

## 2021-06-29 VITALS — BP 156/70 | HR 70 | Temp 96.2°F | Resp 20 | Wt 143.0 lb

## 2021-06-29 DIAGNOSIS — C772 Secondary and unspecified malignant neoplasm of intra-abdominal lymph nodes: Secondary | ICD-10-CM | POA: Diagnosis not present

## 2021-06-29 DIAGNOSIS — Z5111 Encounter for antineoplastic chemotherapy: Secondary | ICD-10-CM | POA: Insufficient documentation

## 2021-06-29 DIAGNOSIS — G620 Drug-induced polyneuropathy: Secondary | ICD-10-CM | POA: Diagnosis not present

## 2021-06-29 DIAGNOSIS — T451X5A Adverse effect of antineoplastic and immunosuppressive drugs, initial encounter: Secondary | ICD-10-CM

## 2021-06-29 DIAGNOSIS — C187 Malignant neoplasm of sigmoid colon: Secondary | ICD-10-CM | POA: Insufficient documentation

## 2021-06-29 DIAGNOSIS — C189 Malignant neoplasm of colon, unspecified: Secondary | ICD-10-CM | POA: Diagnosis not present

## 2021-06-29 DIAGNOSIS — Z5112 Encounter for antineoplastic immunotherapy: Secondary | ICD-10-CM | POA: Insufficient documentation

## 2021-06-29 DIAGNOSIS — Z452 Encounter for adjustment and management of vascular access device: Secondary | ICD-10-CM | POA: Insufficient documentation

## 2021-06-29 DIAGNOSIS — Z5189 Encounter for other specified aftercare: Secondary | ICD-10-CM | POA: Diagnosis not present

## 2021-06-29 LAB — CBC WITH DIFFERENTIAL/PLATELET
Abs Immature Granulocytes: 0.07 10*3/uL (ref 0.00–0.07)
Basophils Absolute: 0.1 10*3/uL (ref 0.0–0.1)
Basophils Relative: 1 %
Eosinophils Absolute: 0.1 10*3/uL (ref 0.0–0.5)
Eosinophils Relative: 2 %
HCT: 32 % — ABNORMAL LOW (ref 39.0–52.0)
Hemoglobin: 10.2 g/dL — ABNORMAL LOW (ref 13.0–17.0)
Immature Granulocytes: 1 %
Lymphocytes Relative: 18 %
Lymphs Abs: 1.3 10*3/uL (ref 0.7–4.0)
MCH: 29.1 pg (ref 26.0–34.0)
MCHC: 31.9 g/dL (ref 30.0–36.0)
MCV: 91.2 fL (ref 80.0–100.0)
Monocytes Absolute: 0.6 10*3/uL (ref 0.1–1.0)
Monocytes Relative: 8 %
Neutro Abs: 5.2 10*3/uL (ref 1.7–7.7)
Neutrophils Relative %: 70 %
Platelets: 107 10*3/uL — ABNORMAL LOW (ref 150–400)
RBC: 3.51 MIL/uL — ABNORMAL LOW (ref 4.22–5.81)
RDW: 16.9 % — ABNORMAL HIGH (ref 11.5–15.5)
WBC: 7.3 10*3/uL (ref 4.0–10.5)
nRBC: 0 % (ref 0.0–0.2)

## 2021-06-29 LAB — COMPREHENSIVE METABOLIC PANEL
ALT: 27 U/L (ref 0–44)
AST: 51 U/L — ABNORMAL HIGH (ref 15–41)
Albumin: 3.4 g/dL — ABNORMAL LOW (ref 3.5–5.0)
Alkaline Phosphatase: 116 U/L (ref 38–126)
Anion gap: 7 (ref 5–15)
BUN: 21 mg/dL (ref 8–23)
CO2: 22 mmol/L (ref 22–32)
Calcium: 8.3 mg/dL — ABNORMAL LOW (ref 8.9–10.3)
Chloride: 106 mmol/L (ref 98–111)
Creatinine, Ser: 0.76 mg/dL (ref 0.61–1.24)
GFR, Estimated: >60 mL/min (ref >60)
Glucose, Bld: 96 mg/dL (ref 70–99)
Potassium: 3.8 mmol/L (ref 3.5–5.1)
Sodium: 135 mmol/L (ref 135–145)
Total Bilirubin: 0.7 mg/dL (ref 0.3–1.2)
Total Protein: 6.9 g/dL (ref 6.5–8.1)

## 2021-06-29 LAB — PROTEIN, URINE, RANDOM: Total Protein, Urine: 16 mg/dL

## 2021-06-29 MED ORDER — FLUOROURACIL CHEMO INJECTION 2.5 GM/50ML
400.0000 mg/m2 | Freq: Once | INTRAVENOUS | Status: AC
  Administered 2021-06-29: 700 mg via INTRAVENOUS
  Filled 2021-06-29: qty 14

## 2021-06-29 MED ORDER — SODIUM CHLORIDE 0.9 % IV SOLN
7.5000 mg/kg | Freq: Once | INTRAVENOUS | Status: AC
  Administered 2021-06-29: 500 mg via INTRAVENOUS
  Filled 2021-06-29: qty 16

## 2021-06-29 MED ORDER — DEXAMETHASONE SODIUM PHOSPHATE 10 MG/ML IJ SOLN
10.0000 mg | Freq: Once | INTRAMUSCULAR | Status: AC
  Administered 2021-06-29: 10 mg via INTRAVENOUS
  Filled 2021-06-29: qty 1

## 2021-06-29 MED ORDER — SODIUM CHLORIDE 0.9 % IV SOLN
Freq: Once | INTRAVENOUS | Status: AC
  Filled 2021-06-29: qty 250

## 2021-06-29 MED ORDER — SODIUM CHLORIDE 0.9 % IV SOLN
2400.0000 mg/m2 | INTRAVENOUS | Status: DC
  Administered 2021-06-29: 4300 mg via INTRAVENOUS
  Filled 2021-06-29: qty 86

## 2021-06-29 MED ORDER — SODIUM CHLORIDE 0.9 % IV SOLN
700.0000 mg | Freq: Once | INTRAVENOUS | Status: AC
  Administered 2021-06-29: 700 mg via INTRAVENOUS
  Filled 2021-06-29: qty 25

## 2021-06-29 NOTE — Progress Notes (Signed)
Hematology/Oncology Consult note [facility]  Telephone:([phone] [phone]  Patient Care Team: Jodi Marble, MD as PCP - General (Internal Medicine) Clent Jacks, RN as Oncology Nurse Navigator Sindy Guadeloupe, MD as Consulting Physician (Hematology and Oncology)   Name of the patient: Eric Simon  [id number]  1951-07-28   Date of visit: 06/29/21  Diagnosis- metastatic colon cancer with lymph node metastases K-ras/BRAF wild-type  Chief complaint/ Reason for visit-on treatment assessment prior to cycle 39 of 5-FU Mvasi chemotherapy  Heme/Onc history: Patient is a 70 yr old male with >40 pack year history of smoking. He currently smokes 0.5ppd. he presented to the ER with symptoms of left sided chest pain and left arm pain. Troponin was negative ekg was unremarkable. Ct chest showed no PE. He was incidentally noted to have mediastinal and retrocrural adenopathy and a 2.1X2.3X4.4 cm retroaortic soft tissue lesion in the posterior left chest. 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  Low-grade activity in the left lateral fifth and sixth ribs associated with nondisplaced fractures likely reflecting healing response problem malignancy.   Patient underwent CT-guided biopsy of the retroperitoneal lymph node pathology showed metastatic adenocarcinoma compatible with colorectal origin.  CK7 negative.  CK20 positive.  CDX 2+.  TTF-1 negative.  PSA negative.  This pattern of immunoreactivity supports the above diagnosis. Patient underwent colonoscopy which showed sigmoid mass that was consistent with adenocarcinoma.  RAS panel testing showed that he was wild-type for both K-ras and BRAF   FOLFOX and bevacizumab chemotherapy started in July 2020.  Subsequently oxaliplatin has been on hold given neuropathy.  Recent scans have shown slow  increase in the size of intra-abdominal adenopathy but patient continues to have low-volume disease.Irinotecan has not been added to the regimen yet.    Interval history- Patient reports doing well presently.  His weight was up to 143 pounds today as compared to 136 pounds 3 weeks ago.  Reports that his appetite and weight is good overall.  Denies any rectal bleeding.  Peripheral neuropathy is stable  ECOG PS- 1 Pain scale- 0  Review of systems- Review of Systems  Constitutional:  Negative for chills, fever and weight loss.  HENT:  Negative for congestion, ear discharge and nosebleeds.   Eyes:  Negative for blurred vision.  Respiratory:  Negative for cough, hemoptysis, sputum production, shortness of breath and wheezing.   Cardiovascular:  Negative for chest pain, palpitations, orthopnea and claudication.  Gastrointestinal:  Negative for abdominal pain, blood in stool, constipation, diarrhea, heartburn, melena, nausea and vomiting.  Genitourinary:  Negative for dysuria, flank pain, frequency, hematuria and urgency.  Musculoskeletal:  Negative for back pain, joint pain and myalgias.  Skin:  Negative for rash.  Neurological:  Negative for dizziness, tingling, focal weakness, seizures, weakness and headaches.  Endo/Heme/Allergies:  Does not bruise/bleed easily.  Psychiatric/Behavioral:  Negative for depression and suicidal ideas. The patient does not have insomnia.      No Known Allergies   Past Medical History:  Diagnosis Date   Colon cancer ([place])    COPD (chronic obstructive pulmonary disease) ([place])    Hyperlipidemia      Past Surgical History:  Procedure Laterality Date   COLONOSCOPY WITH PROPOFOL N/A 06/26/2019   Procedure: COLONOSCOPY WITH PROPOFOL;  Surgeon: [name], MD;  Location: [facility] ENDOSCOPY;  Service: Gastroenterology;  Laterality: N/A;   FLEXIBLE SIGMOIDOSCOPY N/A 05/07/2021  Procedure: FLEXIBLE SIGMOIDOSCOPY;  Surgeon: [name], MD;  Location: [facility] ENDOSCOPY;   Service: Gastroenterology;  Laterality: N/A;   PORTA CATH INSERTION N/A 06/25/2019   Procedure: PORTA CATH INSERTION;  Surgeon: Algernon Huxley, MD;  Location: [place] CV LAB;  Service: Cardiovascular;  Laterality: N/A;   PORTA CATH INSERTION Left 08/11/2020   Procedure: PORTA CATH INSERTION;  Surgeon: Algernon Huxley, MD;  Location: [place] CV LAB;  Service: Cardiovascular;  Laterality: Left;   PORTA CATH REMOVAL Right 08/11/2020   Procedure: PORTA CATH REMOVAL;  Surgeon: Algernon Huxley, MD;  Location: [place] CV LAB;  Service: Cardiovascular;  Laterality: Right;    Social History   Socioeconomic History   Marital status: Single    Spouse name: Not on file   Number of children: Not on file   Years of education: Not on file   Highest education level: Not on file  Occupational History   Not on file  Tobacco Use   Smoking status: Every Day    Packs/day: 0.50    Years: 40.00    Pack years: 20.00    Types: Cigarettes   Smokeless tobacco: Never   Tobacco comments:    smoking 40 years  Vaping Use   Vaping Use: Never used  Substance and Sexual Activity   Alcohol use: No   Drug use: No   Sexual activity: Not Currently  Other Topics Concern   Not on file  Social History Narrative   Not on file   Social Determinants of Health   Financial Resource Strain: Not on file  Food Insecurity: Not on file  Transportation Needs: Not on file  Physical Activity: Not on file  Stress: Not on file  Social Connections: Not on file  Intimate Partner Violence: Not on file    Family History  Problem Relation Age of Onset   Brain cancer Father      Current Outpatient Medications:    aspirin EC 81 MG tablet, Take 81 mg by mouth daily., Disp: , Rfl:    azelastine (ASTELIN) 0.1 % nasal spray, Place 1 spray into both nostrils 1 day or 1 dose., Disp: , Rfl:    butalbital-acetaminophen-caffeine (FIORICET) 50-325-40 MG tablet, Take 1 tablet by mouth every 4 (four) hours as needed., Disp:  , Rfl:    cetirizine (ZYRTEC) 10 MG tablet, Take 1 tablet (10 mg total) by mouth daily., Disp: 30 tablet, Rfl: 0   chlorthalidone (HYGROTON) 25 MG tablet, Take 1 tablet by mouth daily., Disp: , Rfl:    dexamethasone (DECADRON) 4 MG tablet, Take 2 tablets (8 mg total) by mouth daily. Start the day after chemotherapy for 2 days. Take with food., Disp: 30 tablet, Rfl: 1   DEXILANT 60 MG capsule, Take 1 capsule by mouth 1 day or 1 dose., Disp: , Rfl:    DULoxetine (CYMBALTA) 30 MG capsule, Take 1 capsule (30 mg total) by mouth 2 (two) times daily., Disp: 60 capsule, Rfl: 1   fluticasone (FLONASE) 50 MCG/ACT nasal spray, Place 2 sprays into both nostrils 1 day or 1 dose. , Disp: , Rfl:    fluticasone (VERAMYST) 27.5 MCG/SPRAY nasal spray, Place 2 sprays into the nose daily., Disp: , Rfl:    lactulose (CHRONULAC) 10 GM/15ML solution, Take 30 mLs (20 g total) by mouth 3 (three) times daily., Disp: 8100 mL, Rfl: 1   lidocaine-prilocaine (EMLA) cream, Apply to affected area once, Disp: 30 g, Rfl: 3   magic mouthwash w/lidocaine SOLN, Take  5-10 mLs by mouth 4 (four) times daily as needed for mouth pain., Disp: 480 mL, Rfl: 3   montelukast (SINGULAIR) 10 MG tablet, Take 10 mg by mouth at bedtime., Disp: , Rfl:    nicotine (NICODERM CQ - DOSED IN MG/24 HOURS) 21 mg/24hr patch, Place 1 patch onto the skin 1 day or 1 dose., Disp: , Rfl:    ondansetron (ZOFRAN) 8 MG tablet, Take 1 tablet (8 mg total) by mouth 2 (two) times daily as needed for refractory nausea / vomiting. Start on day 3 after chemotherapy., Disp: 30 tablet, Rfl: 1   oxyCODONE (OXY IR/ROXICODONE) 5 MG immediate release tablet, Take 1 tablet (5 mg total) by mouth every 6 (six) hours as needed for severe pain., Disp: 30 tablet, Rfl: 0   pantoprazole (PROTONIX) 40 MG tablet, Take 40 mg by mouth daily. , Disp: , Rfl:    potassium chloride SA (K-DUR) 20 MEQ tablet, Take 1 tablet (20 mEq total) by mouth daily., Disp: 14 tablet, Rfl: 0   pregabalin  (LYRICA) 75 MG capsule, Take 1 capsule (75 mg total) by mouth 2 (two) times daily., Disp: 60 capsule, Rfl: 2   prochlorperazine (COMPAZINE) 10 MG tablet, Take 1 tablet (10 mg total) by mouth every 6 (six) hours as needed (Nausea or vomiting)., Disp: 30 tablet, Rfl: 1   simvastatin (ZOCOR) 20 MG tablet, Take 20 mg by mouth daily. , Disp: , Rfl:    SYMBICORT 80-4.5 MCG/ACT inhaler, Inhale 2 puffs into the lungs 1 day or 1 dose. , Disp: , Rfl:    tamsulosin (FLOMAX) 0.4 MG CAPS capsule, Take 0.4 mg by mouth. , Disp: , Rfl:  No current facility-administered medications for this visit.  Facility-Administered Medications Ordered in Other Visits:    fluorouracil (ADRUCIL) 4,300 mg in sodium chloride 0.9 % 64 mL chemo infusion, 2,400 mg/m2 (Treatment Plan Recorded), Intravenous, 1 day or 1 dose, Sindy Guadeloupe, MD, 4,300 mg at 06/29/21 1156   sodium chloride flush (NS) 0.9 % injection 10 mL, 10 mL, Intracatheter, PRN, Sindy Guadeloupe, MD, 10 mL at 08/15/19 1405  Physical exam:  Vitals:   06/29/21 0907  BP: (!) 156/70  Pulse: 70  Resp: 20  Temp: (!) 96.2 F (35.7 C)  TempSrc: Tympanic  SpO2: 100%  Weight: 143 lb (64.9 kg)   Physical Exam Constitutional:      General: He is not in acute distress. Cardiovascular:     Rate and Rhythm: Normal rate and regular rhythm.     Heart sounds: Normal heart sounds.  Pulmonary:     Effort: Pulmonary effort is normal.     Breath sounds: Normal breath sounds.  Abdominal:     General: Bowel sounds are normal.     Palpations: Abdomen is soft.  Skin:    General: Skin is warm and dry.  Neurological:     Mental Status: He is alert and oriented to person, place, and time.     CMP Latest Ref Rng & Units 06/29/2021  Glucose 70 - 99 mg/dL 96  BUN 8 - 23 mg/dL 21  Creatinine 0.61 - 1.24 mg/dL 0.76  Sodium 135 - 145 mmol/L 135  Potassium 3.5 - 5.1 mmol/L 3.8  Chloride 98 - 111 mmol/L 106  CO2 22 - 32 mmol/L 22  Calcium 8.9 - 10.3 mg/dL 8.3(L)  Total  Protein 6.5 - 8.1 g/dL 6.9  Total Bilirubin 0.3 - 1.2 mg/dL 0.7  Alkaline Phos 38 - 126 U/L 116  AST 15 -  41 U/L 51(H)  ALT 0 - 44 U/L 27   CBC Latest Ref Rng & Units 06/29/2021  WBC 4.0 - 10.5 K/uL 7.3  Hemoglobin 13.0 - 17.0 g/dL 10.2(L)  Hematocrit 39.0 - 52.0 % 32.0(L)  Platelets 150 - 400 K/uL 107(L)      Assessment and plan- Patient is a 70 y.o. male with metastatic colon cancer and lymph node metastases here for on treatment assessment prior to cycle 38 of 5-FU Mvasi chemotherapy  Counts okay to proceed with cycle 38 of 5-FU Mvasi chemotherapy today with pump DC on day 3 and gets Udenyca.  Repeat CT chest abdomen and pelvis with contrast in 2 weeks and I will see him back in 3 weeks.  Blood pressure slightly elevated today at 156/70 which we will continue to monitor.  In the past his blood pressures have been less than 251 systolic.  Urine protein on random sample was 16 mg/dL which is acceptable to proceed with Avastin today  Chemo induced peripheral neuropathy: Continue Cymbalta   Visit Diagnosis 1. 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 ([place])   2. Encounter for antineoplastic chemotherapy   3. Encounter for monoclonal antibody treatment for malignancy   4. Chemotherapy-induced peripheral neuropathy ([place])      Dr. Randa Evens, MD, MPH [facility] at [facility] [phone] 06/29/2021 12:55 PM

## 2021-06-29 NOTE — Patient Instructions (Signed)
[facility]  Discharge Instructions: Thank you for choosing [facility] to provide your oncology and hematology care.  If you have a lab appointment with the [facility], please go directly to the [facility] and check in at the registration area.  Wear comfortable clothing and clothing appropriate for easy access to any Portacath or PICC line.   We strive to give you quality time with your provider. You may need to reschedule your appointment if you arrive late (15 or more minutes).  Arriving late affects you and other patients whose appointments are after yours.  Also, if you miss three or more appointments without notifying the office, you may be dismissed from the clinic at the provider's discretion.      For prescription refill requests, have your pharmacy contact our office and allow 72 hours for refills to be completed.    Today you received the following chemotherapy and/or immunotherapy agents Leucovorin and Adrucil       To help prevent nausea and vomiting after your treatment, we encourage you to take your nausea medication as directed.  BELOW ARE SYMPTOMS THAT SHOULD BE REPORTED IMMEDIATELY: *FEVER GREATER THAN 100.4 F (38 C) OR HIGHER *CHILLS OR SWEATING *NAUSEA AND VOMITING THAT IS NOT CONTROLLED WITH YOUR NAUSEA MEDICATION *UNUSUAL SHORTNESS OF BREATH *UNUSUAL BRUISING OR BLEEDING *URINARY PROBLEMS (pain or burning when urinating, or frequent urination) *BOWEL PROBLEMS (unusual diarrhea, constipation, pain near the anus) TENDERNESS IN MOUTH AND THROAT WITH OR WITHOUT PRESENCE OF ULCERS (sore throat, sores in mouth, or a toothache) UNUSUAL RASH, SWELLING OR PAIN  UNUSUAL VAGINAL DISCHARGE OR ITCHING   Items with * indicate a potential emergency and should be followed up as soon as possible or go to the Emergency Department if any problems should occur.  Please show the CHEMOTHERAPY ALERT CARD or IMMUNOTHERAPY ALERT CARD  at check-in to the Emergency Department and triage nurse.  Should you have questions after your visit or need to cancel or reschedule your appointment, please contact [facility]  [phone] and follow the prompts.  Office hours are 8:00 a.m. to 4:30 p.m. Monday - Friday. Please note that voicemails left after 4:00 p.m. may not be returned until the following business day.  We are closed weekends and major holidays. You have access to a nurse at all times for urgent questions. Please call the main number to the clinic [phone] and follow the prompts.  For any non-urgent questions, you may also contact your provider using MyChart. We now offer e-Visits for anyone 29 and older to request care online for non-urgent symptoms. For details visit [email or web address].   Also download the MyChart app! Go to the app store, search "MyChart", open the app, select [facility], and log in with your MyChart username and password.  Due to Covid, a mask is required upon entering the hospital/clinic. If you do not have a mask, one will be given to you upon arrival. For doctor visits, patients may have 1 support person aged 36 or older with them. For treatment visits, patients cannot have anyone with them due to current Covid guidelines and our immunocompromised population.

## 2021-06-29 NOTE — Addendum Note (Signed)
Addended by: Kern Alberta on: 06/29/2021 03:44 PM   Modules accepted: Orders

## 2021-06-30 LAB — CEA: CEA: 7.8 ng/mL — ABNORMAL HIGH (ref 0.0–4.7)

## 2021-07-01 ENCOUNTER — Inpatient Hospital Stay: Payer: Medicare HMO

## 2021-07-01 ENCOUNTER — Telehealth: Payer: Self-pay | Admitting: Gastroenterology

## 2021-07-01 VITALS — BP 114/66 | Temp 96.8°F | Resp 18

## 2021-07-01 DIAGNOSIS — Z5111 Encounter for antineoplastic chemotherapy: Secondary | ICD-10-CM | POA: Diagnosis not present

## 2021-07-01 DIAGNOSIS — C772 Secondary and unspecified malignant neoplasm of intra-abdominal lymph nodes: Secondary | ICD-10-CM

## 2021-07-01 DIAGNOSIS — C189 Malignant neoplasm of colon, unspecified: Secondary | ICD-10-CM

## 2021-07-01 DIAGNOSIS — C187 Malignant neoplasm of sigmoid colon: Secondary | ICD-10-CM | POA: Diagnosis not present

## 2021-07-01 DIAGNOSIS — Z5112 Encounter for antineoplastic immunotherapy: Secondary | ICD-10-CM | POA: Diagnosis not present

## 2021-07-01 DIAGNOSIS — Z452 Encounter for adjustment and management of vascular access device: Secondary | ICD-10-CM | POA: Diagnosis not present

## 2021-07-01 DIAGNOSIS — Z5189 Encounter for other specified aftercare: Secondary | ICD-10-CM | POA: Diagnosis not present

## 2021-07-01 MED ORDER — SODIUM CHLORIDE 0.9% FLUSH
10.0000 mL | INTRAVENOUS | Status: DC | PRN
  Administered 2021-07-01: 10 mL
  Filled 2021-07-01: qty 10

## 2021-07-01 MED ORDER — PEGFILGRASTIM-CBQV 6 MG/0.6ML ~~LOC~~ SOSY
6.0000 mg | PREFILLED_SYRINGE | Freq: Once | SUBCUTANEOUS | Status: AC
  Administered 2021-07-01: 6 mg via SUBCUTANEOUS
  Filled 2021-07-01: qty 0.6

## 2021-07-01 MED ORDER — HEPARIN SOD (PORK) LOCK FLUSH 100 UNIT/ML IV SOLN
500.0000 [IU] | Freq: Once | INTRAVENOUS | Status: AC | PRN
  Administered 2021-07-01: 500 [IU]
  Filled 2021-07-01: qty 5

## 2021-07-01 MED ORDER — HEPARIN SOD (PORK) LOCK FLUSH 100 UNIT/ML IV SOLN
INTRAVENOUS | Status: AC
  Filled 2021-07-01: qty 5

## 2021-07-01 NOTE — Telephone Encounter (Signed)
Patient wants a return phone call from the [facility] to discuss Black stool.

## 2021-07-02 NOTE — Telephone Encounter (Signed)
Called patient but had to leave him a voicemail.

## 2021-07-03 DIAGNOSIS — F17210 Nicotine dependence, cigarettes, uncomplicated: Secondary | ICD-10-CM | POA: Diagnosis not present

## 2021-07-03 DIAGNOSIS — C189 Malignant neoplasm of colon, unspecified: Secondary | ICD-10-CM | POA: Diagnosis not present

## 2021-07-03 DIAGNOSIS — J301 Allergic rhinitis due to pollen: Secondary | ICD-10-CM | POA: Diagnosis not present

## 2021-07-03 DIAGNOSIS — I10 Essential (primary) hypertension: Secondary | ICD-10-CM | POA: Diagnosis not present

## 2021-07-03 DIAGNOSIS — I2510 Atherosclerotic heart disease of native coronary artery without angina pectoris: Secondary | ICD-10-CM | POA: Diagnosis not present

## 2021-07-03 DIAGNOSIS — J449 Chronic obstructive pulmonary disease, unspecified: Secondary | ICD-10-CM | POA: Diagnosis not present

## 2021-07-03 DIAGNOSIS — E790 Hyperuricemia without signs of inflammatory arthritis and tophaceous disease: Secondary | ICD-10-CM | POA: Diagnosis not present

## 2021-07-03 DIAGNOSIS — K219 Gastro-esophageal reflux disease without esophagitis: Secondary | ICD-10-CM | POA: Diagnosis not present

## 2021-07-03 DIAGNOSIS — J019 Acute sinusitis, unspecified: Secondary | ICD-10-CM | POA: Diagnosis not present

## 2021-07-03 NOTE — Telephone Encounter (Signed)
Called patient again and left him another voicemail.

## 2021-07-08 NOTE — Telephone Encounter (Signed)
Patient left a voicemail about his painful bowel movements. He states he has called multiply times this week with no return call. He would like a call back

## 2021-07-14 ENCOUNTER — Ambulatory Visit: Payer: Medicare HMO

## 2021-07-16 ENCOUNTER — Other Ambulatory Visit: Payer: Self-pay

## 2021-07-16 ENCOUNTER — Ambulatory Visit
Admission: RE | Admit: 2021-07-16 | Discharge: 2021-07-16 | Disposition: A | Discharge status: Home / Self Care | Payer: Medicare HMO | Source: Ambulatory Visit | Attending: Oncology | Admitting: Oncology

## 2021-07-16 DIAGNOSIS — K429 Umbilical hernia without obstruction or gangrene: Secondary | ICD-10-CM | POA: Diagnosis not present

## 2021-07-16 DIAGNOSIS — C772 Secondary and unspecified malignant neoplasm of intra-abdominal lymph nodes: Secondary | ICD-10-CM | POA: Diagnosis not present

## 2021-07-16 DIAGNOSIS — K7460 Unspecified cirrhosis of liver: Secondary | ICD-10-CM | POA: Diagnosis not present

## 2021-07-16 DIAGNOSIS — I2510 Atherosclerotic heart disease of native coronary artery without angina pectoris: Secondary | ICD-10-CM | POA: Diagnosis not present

## 2021-07-16 DIAGNOSIS — J9811 Atelectasis: Secondary | ICD-10-CM | POA: Diagnosis not present

## 2021-07-16 DIAGNOSIS — J432 Centrilobular emphysema: Secondary | ICD-10-CM | POA: Diagnosis not present

## 2021-07-16 DIAGNOSIS — C189 Malignant neoplasm of colon, unspecified: Secondary | ICD-10-CM | POA: Insufficient documentation

## 2021-07-16 DIAGNOSIS — K2970 Gastritis, unspecified, without bleeding: Secondary | ICD-10-CM | POA: Diagnosis not present

## 2021-07-16 DIAGNOSIS — M5137 Other intervertebral disc degeneration, lumbosacral region: Secondary | ICD-10-CM | POA: Diagnosis not present

## 2021-07-16 MED ORDER — IOHEXOL 350 MG/ML SOLN
100.0000 mL | Freq: Once | INTRAVENOUS | Status: AC | PRN
  Administered 2021-07-16: 100 mL via INTRAVENOUS

## 2021-07-20 ENCOUNTER — Inpatient Hospital Stay (HOSPITAL_BASED_OUTPATIENT_CLINIC_OR_DEPARTMENT_OTHER): Payer: Medicare HMO | Admitting: Oncology

## 2021-07-20 ENCOUNTER — Inpatient Hospital Stay: Payer: Medicare HMO

## 2021-07-20 ENCOUNTER — Inpatient Hospital Stay: Payer: Medicare HMO | Attending: Oncology

## 2021-07-20 ENCOUNTER — Encounter: Payer: Self-pay | Admitting: Oncology

## 2021-07-20 VITALS — BP 107/61 | HR 81 | Temp 96.0°F | Resp 20 | Wt 136.8 lb

## 2021-07-20 DIAGNOSIS — Z5112 Encounter for antineoplastic immunotherapy: Secondary | ICD-10-CM | POA: Diagnosis not present

## 2021-07-20 DIAGNOSIS — C189 Malignant neoplasm of colon, unspecified: Secondary | ICD-10-CM

## 2021-07-20 DIAGNOSIS — Z7189 Other specified counseling: Secondary | ICD-10-CM

## 2021-07-20 DIAGNOSIS — Z5111 Encounter for antineoplastic chemotherapy: Secondary | ICD-10-CM

## 2021-07-20 DIAGNOSIS — R161 Splenomegaly, not elsewhere classified: Secondary | ICD-10-CM | POA: Insufficient documentation

## 2021-07-20 DIAGNOSIS — D6959 Other secondary thrombocytopenia: Secondary | ICD-10-CM | POA: Insufficient documentation

## 2021-07-20 DIAGNOSIS — C772 Secondary and unspecified malignant neoplasm of intra-abdominal lymph nodes: Secondary | ICD-10-CM

## 2021-07-20 DIAGNOSIS — D509 Iron deficiency anemia, unspecified: Secondary | ICD-10-CM | POA: Diagnosis not present

## 2021-07-20 DIAGNOSIS — Z452 Encounter for adjustment and management of vascular access device: Secondary | ICD-10-CM | POA: Diagnosis not present

## 2021-07-20 DIAGNOSIS — C187 Malignant neoplasm of sigmoid colon: Secondary | ICD-10-CM | POA: Insufficient documentation

## 2021-07-20 LAB — COMPREHENSIVE METABOLIC PANEL
ALT: 18 U/L (ref 0–44)
AST: 37 U/L (ref 15–41)
Albumin: 3.6 g/dL (ref 3.5–5.0)
Alkaline Phosphatase: 93 U/L (ref 38–126)
Anion gap: 7 (ref 5–15)
BUN: 27 mg/dL — ABNORMAL HIGH (ref 8–23)
CO2: 23 mmol/L (ref 22–32)
Calcium: 8.6 mg/dL — ABNORMAL LOW (ref 8.9–10.3)
Chloride: 102 mmol/L (ref 98–111)
Creatinine, Ser: 1.07 mg/dL (ref 0.61–1.24)
GFR, Estimated: >60 mL/min (ref >60)
Glucose, Bld: 92 mg/dL (ref 70–99)
Potassium: 4.2 mmol/L (ref 3.5–5.1)
Sodium: 132 mmol/L — ABNORMAL LOW (ref 135–145)
Total Bilirubin: 0.7 mg/dL (ref 0.3–1.2)
Total Protein: 6.9 g/dL (ref 6.5–8.1)

## 2021-07-20 LAB — CBC WITH DIFFERENTIAL/PLATELET
Abs Immature Granulocytes: 0.04 10*3/uL (ref 0.00–0.07)
Basophils Absolute: 0.1 10*3/uL (ref 0.0–0.1)
Basophils Relative: 1 %
Eosinophils Absolute: 0.1 10*3/uL (ref 0.0–0.5)
Eosinophils Relative: 1 %
HCT: 29.5 % — ABNORMAL LOW (ref 39.0–52.0)
Hemoglobin: 9.3 g/dL — ABNORMAL LOW (ref 13.0–17.0)
Immature Granulocytes: 0 %
Lymphocytes Relative: 18 %
Lymphs Abs: 1.7 10*3/uL (ref 0.7–4.0)
MCH: 28.2 pg (ref 26.0–34.0)
MCHC: 31.5 g/dL (ref 30.0–36.0)
MCV: 89.4 fL (ref 80.0–100.0)
Monocytes Absolute: 0.7 10*3/uL (ref 0.1–1.0)
Monocytes Relative: 8 %
Neutro Abs: 6.8 10*3/uL (ref 1.7–7.7)
Neutrophils Relative %: 72 %
Platelets: 106 10*3/uL — ABNORMAL LOW (ref 150–400)
RBC: 3.3 MIL/uL — ABNORMAL LOW (ref 4.22–5.81)
RDW: 18.5 % — ABNORMAL HIGH (ref 11.5–15.5)
WBC: 9.4 10*3/uL (ref 4.0–10.5)
nRBC: 0 % (ref 0.0–0.2)

## 2021-07-20 LAB — PROTEIN, URINE, RANDOM: Total Protein, Urine: <6 mg/dL

## 2021-07-20 MED ORDER — HEPARIN SOD (PORK) LOCK FLUSH 100 UNIT/ML IV SOLN
500.0000 [IU] | Freq: Once | INTRAVENOUS | Status: AC
  Administered 2021-07-20: 500 [IU] via INTRAVENOUS
  Filled 2021-07-20: qty 5

## 2021-07-20 MED ORDER — LOPERAMIDE HCL 2 MG PO TABS: 2.0000 mg | ORAL_TABLET | Freq: Four times a day (QID) | ORAL | 1 refills | Status: DC | PRN

## 2021-07-20 NOTE — Progress Notes (Signed)
DISCONTINUE ON PATHWAY REGIMEN - Colorectal     A cycle is every 14 days:     Bevacizumab-xxxx      Oxaliplatin      Leucovorin      Fluorouracil      Fluorouracil   **Always confirm dose/schedule in your pharmacy ordering system**  REASON: Disease Progression PRIOR TREATMENT: [id number]: [id number] + Bevacizumab q14 Days TREATMENT RESPONSE: Progressive Disease (PD)    Patient Characteristics: Distant Metastases, Nonsurgical Candidate, KRAS/NRAS Wild-Type (BRAF V600 Wild-Type/Unknown), Standard Cytotoxic Therapy, Second Line Standard Cytotoxic Therapy, Bevacizumab Ineligible Tumor Location: Colon Therapeutic Status: Distant Metastases Microsatellite/Mismatch Repair Status: MSS/pMMR BRAF Mutation Status: Wild-Type (no mutation) KRAS/NRAS Mutation Status: Wild-Type (no mutation) Preferred Therapy Approach: Standard Cytotoxic Therapy Standard Cytotoxic Line of Therapy: Second Line Standard Cytotoxic Therapy Bevacizumab Eligibility: Ineligible

## 2021-07-20 NOTE — Progress Notes (Signed)
START ON PATHWAY REGIMEN - Colorectal     A cycle is every 14 days:     Panitumumab      Irinotecan      Leucovorin      Fluorouracil      Fluorouracil   **Always confirm dose/schedule in your pharmacy ordering system**  Patient Characteristics: Distant Metastases, Nonsurgical Candidate, KRAS/NRAS Wild-Type (BRAF V600 Wild-Type/Unknown), Standard Cytotoxic Therapy, Second Line Standard Cytotoxic Therapy, Bevacizumab Ineligible Tumor Location: Colon Therapeutic Status: Distant Metastases Microsatellite/Mismatch Repair Status: MSS/pMMR BRAF Mutation Status: Wild-Type (no mutation) KRAS/NRAS Mutation Status: Wild-Type (no mutation) Preferred Therapy Approach: Standard Cytotoxic Therapy Standard Cytotoxic Line of Therapy: Second Line Standard Cytotoxic Therapy Bevacizumab Eligibility: Ineligible Intent of Therapy: Non-Curative / Palliative Intent, Discussed with Patient

## 2021-07-20 NOTE — Progress Notes (Signed)
Hematology/Oncology Consult note [facility]  Telephone:([phone] [phone]  Patient Care Team: Jodi Marble, MD as PCP - General (Internal Medicine) Clent Jacks, RN as Oncology Nurse Navigator Sindy Guadeloupe, MD as Consulting Physician (Hematology and Oncology)   Name of the patient: Eric Simon  [id number]  October 30, 1951   Date of visit: 07/20/21  Diagnosis- metastatic colon cancer with lymph node metastases K-ras/BRAF wild-type    Chief complaint/ Reason for visit-on treatment assessment prior to cycle 40 of 5-FU Mvasi chemotherapy  Heme/Onc history: Patient is a 70 yr old male with >40 pack year history of smoking. He currently smokes 0.5ppd. he presented to the ER with symptoms of left sided chest pain and left arm pain. Troponin was negative ekg was unremarkable. Ct chest showed no PE. He was incidentally noted to have mediastinal and retrocrural adenopathy and a 2.1X2.3X4.4 cm retroaortic soft tissue lesion in the posterior left chest. 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  Low-grade activity in the left lateral fifth and sixth ribs associated with nondisplaced fractures likely reflecting healing response problem malignancy.   Patient underwent CT-guided biopsy of the retroperitoneal lymph node pathology showed metastatic adenocarcinoma compatible with colorectal origin.  CK7 negative.  CK20 positive.  CDX 2+.  TTF-1 negative.  PSA negative.  This pattern of immunoreactivity supports the above diagnosis. Patient underwent colonoscopy which showed sigmoid mass that was consistent with adenocarcinoma.  RAS panel testing showed that he was wild-type for both K-ras and BRAF   FOLFOX and bevacizumab chemotherapy started in July 2020.  Subsequently oxaliplatin has been on hold given neuropathy.  Recent scans have shown slow  increase in the size of intra-abdominal adenopathy but patient continues to have low-volume disease.Irinotecan has not been added to the regimen yet.      Interval history-patient reports having occasional rectal pain from his hemorrhoids.  Denies any active bright red blood in his stools.  Reports constant feeling of hunger without frank nausea or vomiting.  ECOG PS- 1 Pain scale- 2   Review of systems- Review of Systems  Constitutional:  Positive for malaise/fatigue. Negative for chills, fever and weight loss.  HENT:  Negative for congestion, ear discharge and nosebleeds.   Eyes:  Negative for blurred vision.  Respiratory:  Negative for cough, hemoptysis, sputum production, shortness of breath and wheezing.   Cardiovascular:  Negative for chest pain, palpitations, orthopnea and claudication.  Gastrointestinal:  Negative for abdominal pain, blood in stool, constipation, diarrhea, heartburn, melena, nausea and vomiting.  Genitourinary:  Negative for dysuria, flank pain, frequency, hematuria and urgency.  Musculoskeletal:  Negative for back pain, joint pain and myalgias.  Skin:  Negative for rash.  Neurological:  Negative for dizziness, tingling, focal weakness, seizures, weakness and headaches.  Endo/Heme/Allergies:  Does not bruise/bleed easily.  Psychiatric/Behavioral:  Negative for depression and suicidal ideas. The patient does not have insomnia.      No Known Allergies   Past Medical History:  Diagnosis Date   Colon cancer ([place])    COPD (chronic obstructive pulmonary disease) ([place])    Hyperlipidemia      Past Surgical History:  Procedure Laterality Date   COLONOSCOPY WITH PROPOFOL N/A 06/26/2019   Procedure: COLONOSCOPY WITH PROPOFOL;  Surgeon: [name], MD;  Location: [facility] ENDOSCOPY;  Service: Gastroenterology;  Laterality: N/A;   FLEXIBLE SIGMOIDOSCOPY N/A 05/07/2021   Procedure: FLEXIBLE SIGMOIDOSCOPY;  Surgeon: [name], MD;  Location: [facility] ENDOSCOPY;  Service:  Gastroenterology;  Laterality: N/A;   PORTA CATH INSERTION N/A 06/25/2019   Procedure: PORTA CATH INSERTION;  Surgeon: Algernon Huxley, MD;  Location: [place] CV LAB;  Service: Cardiovascular;  Laterality: N/A;   PORTA CATH INSERTION Left 08/11/2020   Procedure: PORTA CATH INSERTION;  Surgeon: Algernon Huxley, MD;  Location: [place] [facility] LAB;  Service: Cardiovascular;  Laterality: Left;   PORTA CATH REMOVAL Right 08/11/2020   Procedure: PORTA CATH REMOVAL;  Surgeon: Algernon Huxley, MD;  Location: [place] [facility] LAB;  Service: Cardiovascular;  Laterality: Right;    Social History   Socioeconomic History   Marital status: Single    Spouse name: Not on file   Number of children: Not on file   Years of education: Not on file   Highest education level: Not on file  Occupational History   Not on file  Tobacco Use   Smoking status: Every Day    Packs/day: 0.50    Years: 40.00    Pack years: 20.00    Types: Cigarettes   Smokeless tobacco: Never   Tobacco comments:    smoking 40 years  Vaping Use   Vaping Use: Never used  Substance and Sexual Activity   Alcohol use: No   Drug use: No   Sexual activity: Not Currently  Other Topics Concern   Not on file  Social History Narrative   Not on file   Social Determinants of Health   Financial Resource Strain: Not on file  Food Insecurity: Not on file  Transportation Needs: Not on file  Physical Activity: Not on file  Stress: Not on file  Social Connections: Not on file  Intimate Partner Violence: Not on file    Family History  Problem Relation Age of Onset   Brain cancer Father      Current Outpatient Medications:    aspirin EC 81 MG tablet, Take 81 mg by mouth daily., Disp: , Rfl:    azelastine (ASTELIN) 0.1 % nasal spray, Place 1 spray into both nostrils 1 day or 1 dose., Disp: , Rfl:    butalbital-acetaminophen-caffeine (FIORICET) 50-325-40 MG tablet, Take 1 tablet by mouth every 4 (four) hours as needed., Disp: , Rfl:     cetirizine (ZYRTEC) 10 MG tablet, Take 1 tablet (10 mg total) by mouth daily., Disp: 30 tablet, Rfl: 0   chlorthalidone (HYGROTON) 25 MG tablet, Take 1 tablet by mouth daily., Disp: , Rfl:    dexamethasone (DECADRON) 4 MG tablet, Take 2 tablets (8 mg total) by mouth daily. Start the day after chemotherapy for 2 days. Take with food., Disp: 30 tablet, Rfl: 1   DEXILANT 60 MG capsule, Take 1 capsule by mouth 1 day or 1 dose., Disp: , Rfl:    DULoxetine (CYMBALTA) 30 MG capsule, Take 1 capsule (30 mg total) by mouth 2 (two) times daily., Disp: 60 capsule, Rfl: 1   fluticasone (FLONASE) 50 MCG/ACT nasal spray, Place 2 sprays into both nostrils 1 day or 1 dose. , Disp: , Rfl:    fluticasone (VERAMYST) 27.5 MCG/SPRAY nasal spray, Place 2 sprays into the nose daily., Disp: , Rfl:    lactulose (CHRONULAC) 10 GM/15ML solution, Take 30 mLs (20 g total) by mouth 3 (three) times daily., Disp: 8100 mL, Rfl: 1   lidocaine-prilocaine (EMLA) cream, Apply to affected area once, Disp: 30 g, Rfl: 3   magic mouthwash w/lidocaine SOLN, Take 5-10 mLs by mouth  4 (four) times daily as needed for mouth pain., Disp: 480 mL, Rfl: 3   montelukast (SINGULAIR) 10 MG tablet, Take 10 mg by mouth at bedtime., Disp: , Rfl:    nicotine (NICODERM CQ - DOSED IN MG/24 HOURS) 21 mg/24hr patch, Place 1 patch onto the skin 1 day or 1 dose., Disp: , Rfl:    ondansetron (ZOFRAN) 8 MG tablet, Take 1 tablet (8 mg total) by mouth 2 (two) times daily as needed for refractory nausea / vomiting. Start on day 3 after chemotherapy., Disp: 30 tablet, Rfl: 1   oxyCODONE (OXY IR/ROXICODONE) 5 MG immediate release tablet, Take 1 tablet (5 mg total) by mouth every 6 (six) hours as needed for severe pain., Disp: 30 tablet, Rfl: 0   pantoprazole (PROTONIX) 40 MG tablet, Take 40 mg by mouth daily. , Disp: , Rfl:    potassium chloride SA (K-DUR) 20 MEQ tablet, Take 1 tablet (20 mEq total) by mouth daily., Disp: 14 tablet, Rfl: 0   pregabalin (LYRICA) 75 MG  capsule, Take 1 capsule (75 mg total) by mouth 2 (two) times daily., Disp: 60 capsule, Rfl: 2   prochlorperazine (COMPAZINE) 10 MG tablet, Take 1 tablet (10 mg total) by mouth every 6 (six) hours as needed (Nausea or vomiting)., Disp: 30 tablet, Rfl: 1   simvastatin (ZOCOR) 20 MG tablet, Take 20 mg by mouth daily. , Disp: , Rfl:    SYMBICORT 80-4.5 MCG/ACT inhaler, Inhale 2 puffs into the lungs 1 day or 1 dose. , Disp: , Rfl:    tamsulosin (FLOMAX) 0.4 MG CAPS capsule, Take 0.4 mg by mouth. , Disp: , Rfl:  No current facility-administered medications for this visit.  Facility-Administered Medications Ordered in Other Visits:    sodium chloride flush (NS) 0.9 % injection 10 mL, 10 mL, Intracatheter, PRN, Sindy Guadeloupe, MD, 10 mL at 08/15/19 1405  Physical exam:  Vitals:   07/20/21 0903  BP: 107/61  Pulse: 81  Resp: 20  Temp: (!) 96 F (35.6 C)  TempSrc: Tympanic  SpO2: 100%  Weight: 136 lb 12.8 oz (62.1 kg)   Physical Exam Cardiovascular:     Rate and Rhythm: Normal rate and regular rhythm.     Heart sounds: Normal heart sounds.  Pulmonary:     Effort: Pulmonary effort is normal.     Breath sounds: Normal breath sounds.  Abdominal:     General: Bowel sounds are normal.     Palpations: Abdomen is soft.  Skin:    General: Skin is warm and dry.  Neurological:     Mental Status: He is alert and oriented to person, place, and time.     CMP Latest Ref Rng & Units 07/20/2021  Glucose 70 - 99 mg/dL 92  BUN 8 - 23 mg/dL 27(H)  Creatinine 0.61 - 1.24 mg/dL 1.07  Sodium 135 - 145 mmol/L 132(L)  Potassium 3.5 - 5.1 mmol/L 4.2  Chloride 98 - 111 mmol/L 102  CO2 22 - 32 mmol/L 23  Calcium 8.9 - 10.3 mg/dL 8.6(L)  Total Protein 6.5 - 8.1 g/dL 6.9  Total Bilirubin 0.3 - 1.2 mg/dL 0.7  Alkaline Phos 38 - 126 U/L 93  AST 15 - 41 U/L 37  ALT 0 - 44 U/L 18   CBC Latest Ref Rng & Units 07/20/2021  WBC 4.0 - 10.5 K/uL 9.4  Hemoglobin 13.0 - 17.0 g/dL 9.3(L)  Hematocrit 39.0 - 52.0 %  29.5(L)  Platelets 150 - 400 K/uL 106(L)  No images are attached to the encounter.  CT CHEST ABDOMEN PELVIS W CONTRAST  Result Date: 07/17/2021 CLINICAL DATA:  Colon cancer diagnosed in July of 2020. Treated with chemotherapy. Currently asymptomatic. EXAM: CT CHEST, ABDOMEN, AND PELVIS WITH CONTRAST TECHNIQUE: Multidetector CT imaging of the chest, abdomen and pelvis was performed following the standard protocol during bolus administration of intravenous contrast. CONTRAST:  122m OMNIPAQUE IOHEXOL 350 MG/ML SOLN COMPARISON:  04/01/2021 FINDINGS: CT CHEST FINDINGS Cardiovascular: Aortic atherosclerosis. Tortuous thoracic aorta. Normal heart size, without pericardial effusion. Lad coronary artery calcification. No central pulmonary embolism, on this non-dedicated study. Left Port-A-Cath tip at high right atrium. Mediastinum/Nodes: No mediastinal or hilar adenopathy. Lungs/Pleura: No pleural fluid.  Moderate centrilobular emphysema. Minimal motion degradation superiorly. Dependent subsegmental atelectasis in both lower lobes. No findings of pulmonary metastasis. Musculoskeletal: No acute osseous abnormality. CT ABDOMEN PELVIS FINDINGS Hepatobiliary: Caudate enlargement and subtle medial segment left liver lobe atrophy. No focal liver lesion. Normal gallbladder, without biliary ductal dilatation. Pancreas: Normal, without mass or ductal dilatation. Spleen: Mild splenomegaly including at 13.3 cm, chronic. Adrenals/Urinary Tract: Normal adrenal glands. Too small to characterize lesions in both kidneys. No hydronephrosis. Normal urinary bladder. Stomach/Bowel: The gastric antrum appears thick walled, but is underdistended including on 59/2. Scattered colonic diverticula. The previously described rectal wall thickening is no longer appreciated. No well-defined residual mass. Normal terminal ileum and appendix. Normal small bowel. Vascular/Lymphatic: Advanced aortic and branch vessel atherosclerosis. No specific  evidence of portal venous hypertension. Patent portal and splenic veins. Pre caval node measures 6 mm on 85/2 (Previously 5 mm.) Aortocaval node measures 8 mm on 75/2 (Previously 5 mm.) More anterior aortocaval node measures 8 mm on 72/2 (Previously 5 mm.) Left external iliac index node measures 1.5 x 2.2 cm on 108/2 (Previously 1.3 x 1.5 cm.) More central external iliac node measures 1.4 cm on 104/2 (Previously 9 mm.) Reproductive: Mild prostatomegaly. Other: Trace cul-de-sac fluid, new or increased. No evidence of omental or peritoneal disease. Periumbilical tiny fat and fluid containing hernia without bowel involvement. Musculoskeletal: Avascular necrosis of both femoral heads. Degenerate disc disease at the lumbosacral junction. IMPRESSION: 1. Mild progression of abdominopelvic adenopathy, highly suspicious for nodal metastasis. 2. Mild cirrhosis and persistent splenomegaly. New or increased trace cul-de-sac fluid. 3.  No acute process or evidence of metastatic disease in the chest. 4. Aortic atherosclerosis (ICD10-I70.0), coronary artery atherosclerosis and emphysema (ICD10-J43.9). 5. Apparent gastric antral wall thickening is most likely due to underdistention. Recommend clinical correlation for gastritis. 6. Chronic avascular necrosis of both femoral heads. Electronically Signed   By: KAbigail MiyamotoM.D.   On: 07/17/2021 14:45     Assessment and plan- Patient is a 71y.o. male with metastatic colon cancer and intra-abdominal and mediastinal lymph node metastases here for on treatment assessment prior to cycle 39 of 5-FU Mvasi chemotherapy  I have reviewed CT chest abdomen and pelvis images independently and discussed findings with the patient.  Overall he has had a slow growth in the size of his intra-abdominal lymph nodes over the last 1 year.  So far he has been on 5-FU Mvasi chemotherapy but given the consistent increase in the size of his lymph nodes I will switch him to second line treatment with  FOLFIRI along with panitumumab.  He is K-ras wild-type and therefore a candidate for the same.  Discussed risk and benefits of chemotherapy including all but not limited to nausea, vomiting, low blood counts, risk of infections and hospitalizations.  Risk of diarrhea  associated with irinotecan as well as panitumumab.  Skin rash associated with panitumumab.  Patient understands and agrees to proceed as planned.  Treatment will be given with a palliative intent.  Hold off on giving him chemotherapy today and switch to second line regimen in about a week or 10 days time.  He does have some gradually worsening normocytic anemia which could be secondary to chemotherapy.  Will check ferritin iron studies B12 and folate with next set of labs as well as CEA   Visit Diagnosis 1. 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 ([place])   2. Encounter for antineoplastic chemotherapy   3. Goals of care, counseling/discussion   4. Encounter for monoclonal antibody treatment for malignancy      Dr. Randa Evens, MD, MPH [facility] at [facility] [phone] 07/20/2021 4:31 PM

## 2021-07-21 ENCOUNTER — Encounter: Payer: Self-pay | Admitting: Oncology

## 2021-07-21 ENCOUNTER — Other Ambulatory Visit (HOSPITAL_COMMUNITY): Payer: Self-pay

## 2021-07-21 ENCOUNTER — Telehealth: Payer: Self-pay | Admitting: *Deleted

## 2021-07-21 NOTE — Telephone Encounter (Signed)
Alyson can you please call pt as per our discussion and let me know?

## 2021-07-21 NOTE — Telephone Encounter (Signed)
Patient called with message for Dr. [name] stating he would rather "take the pills than go through the infusion".

## 2021-07-22 ENCOUNTER — Inpatient Hospital Stay: Payer: Medicare HMO

## 2021-07-22 ENCOUNTER — Telehealth: Payer: Self-pay | Admitting: Pharmacist

## 2021-07-22 ENCOUNTER — Other Ambulatory Visit (HOSPITAL_COMMUNITY): Payer: Self-pay

## 2021-07-22 ENCOUNTER — Telehealth: Payer: Self-pay | Admitting: Pharmacy Technician

## 2021-07-22 DIAGNOSIS — C189 Malignant neoplasm of colon, unspecified: Secondary | ICD-10-CM

## 2021-07-22 MED ORDER — CAPECITABINE 500 MG PO TABS
1500.0000 mg | ORAL_TABLET | Freq: Two times a day (BID) | ORAL | 0 refills | Status: DC
  Filled 2021-07-22: qty 84, 14d supply, fill #0
  Filled 2021-07-24: qty 84, 28d supply, fill #0

## 2021-07-22 NOTE — Telephone Encounter (Signed)
Oral Oncology Patient Advocate Encounter   Received notification from [facility] that prior authorization for Xeloda is required.   PA submitted on CoverMyMeds Key [id number] Status is pending   Oral Oncology Clinic will continue to follow.  [place] Patient [place] Phone [phone] Fax [phone] 07/23/2021 8:59 AM

## 2021-07-22 NOTE — Telephone Encounter (Signed)
Oral Oncology Pharmacist Encounter  Received new prescription for Xeloda (capecitabine) for the treatment of metastatic colon cancer in conjunction with irinotecan and panitumumab, planned duration until disease progression or unacceptable drug toxicity.  CMP from 07/20/21 assessed, no relevant lab abnormalities. Prescription dose and frequency assessed.   Current medication list in Epic reviewed, one DDIs with capecitabine identified: - Pantoprazole and dexlansoprazole (both currently on med list): Proton Pump Inhibitors (PPI) may diminish the therapeutic effect of capecitabine, varying information on the clinical impact. Recommend evaluating the need for a PPI/acid suppression. If acid suppression is needed, attempt switching to a H2 antagonist (eg, famotidine) if possible.  Evaluated chart and no patient barriers to medication adherence identified.   Prescription has been e-scribed to the [employer] for benefits analysis and approval.  Oral Oncology Clinic will continue to follow for insurance authorization, copayment issues, initial counseling and start date.  Patient agreed to treatment on 07/20/21 per MD documentation.  Darl Pikes, PharmD, BCPS, BCOP, CPP Hematology/Oncology Clinical Pharmacist Practitioner [facility] [phone]  07/22/2021 1:13 PM

## 2021-07-23 ENCOUNTER — Encounter: Payer: Self-pay | Admitting: Oncology

## 2021-07-23 ENCOUNTER — Other Ambulatory Visit (HOSPITAL_COMMUNITY): Payer: Self-pay

## 2021-07-23 NOTE — Telephone Encounter (Signed)
Oral Oncology Patient Advocate Encounter  Prior Authorization for Xeloda has been approved through Part B benefit.    PA# [id number] Effective dates: 07/22/21 through 12/12/21  Patients co-pay is $0.00  Oral Oncology Clinic will continue to follow.   [place] Patient [place] Phone [phone] Fax [phone] 07/23/2021 9:58 AM

## 2021-07-24 ENCOUNTER — Other Ambulatory Visit: Payer: Self-pay | Admitting: Oncology

## 2021-07-24 ENCOUNTER — Other Ambulatory Visit (HOSPITAL_COMMUNITY): Payer: Self-pay

## 2021-07-27 ENCOUNTER — Encounter: Payer: Self-pay | Admitting: Oncology

## 2021-07-27 ENCOUNTER — Inpatient Hospital Stay: Payer: Medicare HMO | Admitting: Pharmacist

## 2021-07-27 ENCOUNTER — Inpatient Hospital Stay: Payer: Medicare HMO

## 2021-07-27 ENCOUNTER — Other Ambulatory Visit (HOSPITAL_COMMUNITY): Payer: Self-pay

## 2021-07-27 ENCOUNTER — Other Ambulatory Visit: Payer: Self-pay | Admitting: Oncology

## 2021-07-27 ENCOUNTER — Inpatient Hospital Stay (HOSPITAL_BASED_OUTPATIENT_CLINIC_OR_DEPARTMENT_OTHER): Payer: Medicare HMO | Admitting: Oncology

## 2021-07-27 DIAGNOSIS — Z79899 Other long term (current) drug therapy: Secondary | ICD-10-CM

## 2021-07-27 DIAGNOSIS — D6959 Other secondary thrombocytopenia: Secondary | ICD-10-CM | POA: Diagnosis not present

## 2021-07-27 DIAGNOSIS — C772 Secondary and unspecified malignant neoplasm of intra-abdominal lymph nodes: Secondary | ICD-10-CM | POA: Diagnosis not present

## 2021-07-27 DIAGNOSIS — D509 Iron deficiency anemia, unspecified: Secondary | ICD-10-CM | POA: Insufficient documentation

## 2021-07-27 DIAGNOSIS — D508 Other iron deficiency anemias: Secondary | ICD-10-CM

## 2021-07-27 DIAGNOSIS — Z5111 Encounter for antineoplastic chemotherapy: Secondary | ICD-10-CM | POA: Diagnosis not present

## 2021-07-27 DIAGNOSIS — C187 Malignant neoplasm of sigmoid colon: Secondary | ICD-10-CM | POA: Diagnosis not present

## 2021-07-27 DIAGNOSIS — C189 Malignant neoplasm of colon, unspecified: Secondary | ICD-10-CM

## 2021-07-27 DIAGNOSIS — Z5112 Encounter for antineoplastic immunotherapy: Secondary | ICD-10-CM | POA: Diagnosis not present

## 2021-07-27 DIAGNOSIS — T451X5A Adverse effect of antineoplastic and immunosuppressive drugs, initial encounter: Secondary | ICD-10-CM | POA: Diagnosis not present

## 2021-07-27 DIAGNOSIS — Z452 Encounter for adjustment and management of vascular access device: Secondary | ICD-10-CM | POA: Diagnosis not present

## 2021-07-27 DIAGNOSIS — R161 Splenomegaly, not elsewhere classified: Secondary | ICD-10-CM | POA: Diagnosis not present

## 2021-07-27 LAB — COMPREHENSIVE METABOLIC PANEL
ALT: 25 U/L (ref 0–44)
AST: 49 U/L — ABNORMAL HIGH (ref 15–41)
Albumin: 3.6 g/dL (ref 3.5–5.0)
Alkaline Phosphatase: 115 U/L (ref 38–126)
Anion gap: 8 (ref 5–15)
BUN: 27 mg/dL — ABNORMAL HIGH (ref 8–23)
CO2: 23 mmol/L (ref 22–32)
Calcium: 8.9 mg/dL (ref 8.9–10.3)
Chloride: 102 mmol/L (ref 98–111)
Creatinine, Ser: 0.85 mg/dL (ref 0.61–1.24)
GFR, Estimated: >60 mL/min (ref >60)
Glucose, Bld: 96 mg/dL (ref 70–99)
Potassium: 4.1 mmol/L (ref 3.5–5.1)
Sodium: 133 mmol/L — ABNORMAL LOW (ref 135–145)
Total Bilirubin: 0.8 mg/dL (ref 0.3–1.2)
Total Protein: 7.2 g/dL (ref 6.5–8.1)

## 2021-07-27 LAB — CBC WITH DIFFERENTIAL/PLATELET
Abs Immature Granulocytes: 0.02 10*3/uL (ref 0.00–0.07)
Basophils Absolute: 0.1 10*3/uL (ref 0.0–0.1)
Basophils Relative: 1 %
Eosinophils Absolute: 0.1 10*3/uL (ref 0.0–0.5)
Eosinophils Relative: 2 %
HCT: 29.8 % — ABNORMAL LOW (ref 39.0–52.0)
Hemoglobin: 9.2 g/dL — ABNORMAL LOW (ref 13.0–17.0)
Immature Granulocytes: 0 %
Lymphocytes Relative: 10 %
Lymphs Abs: 0.8 10*3/uL (ref 0.7–4.0)
MCH: 27.5 pg (ref 26.0–34.0)
MCHC: 30.9 g/dL (ref 30.0–36.0)
MCV: 89.2 fL (ref 80.0–100.0)
Monocytes Absolute: 0.7 10*3/uL (ref 0.1–1.0)
Monocytes Relative: 10 %
Neutro Abs: 5.7 10*3/uL (ref 1.7–7.7)
Neutrophils Relative %: 77 %
Platelets: 91 10*3/uL — ABNORMAL LOW (ref 150–400)
RBC: 3.34 MIL/uL — ABNORMAL LOW (ref 4.22–5.81)
RDW: 17.4 % — ABNORMAL HIGH (ref 11.5–15.5)
WBC: 7.4 10*3/uL (ref 4.0–10.5)
nRBC: 0 % (ref 0.0–0.2)

## 2021-07-27 LAB — FOLATE: Folate: 21.4 ng/mL (ref >5.9)

## 2021-07-27 LAB — IRON AND TIBC
Iron: 40 ug/dL — ABNORMAL LOW (ref 45–182)
Saturation Ratios: 9 % — ABNORMAL LOW (ref 17.9–39.5)
TIBC: 458 ug/dL — ABNORMAL HIGH (ref 250–450)
UIBC: 418 ug/dL

## 2021-07-27 LAB — FERRITIN: Ferritin: 16 ng/mL — ABNORMAL LOW (ref 24–336)

## 2021-07-27 LAB — MAGNESIUM: Magnesium: 1.8 mg/dL (ref 1.7–2.4)

## 2021-07-27 LAB — VITAMIN B12: Vitamin B-12: 1025 pg/mL — ABNORMAL HIGH (ref 180–914)

## 2021-07-27 MED ORDER — CAPECITABINE 500 MG PO TABS
1500.0000 mg | ORAL_TABLET | Freq: Two times a day (BID) | ORAL | 0 refills | Status: DC
  Filled 2021-07-27: qty 84, 14d supply, fill #0
  Filled 2021-08-21: qty 84, 21d supply, fill #0
  Filled 2021-08-24: qty 84, 14d supply, fill #0

## 2021-07-27 MED ORDER — HEPARIN SOD (PORK) LOCK FLUSH 100 UNIT/ML IV SOLN
500.0000 [IU] | Freq: Once | INTRAVENOUS | Status: AC
  Administered 2021-07-27: 500 [IU] via INTRAVENOUS
  Filled 2021-07-27: qty 5

## 2021-07-27 MED ORDER — SODIUM CHLORIDE 0.9% FLUSH
10.0000 mL | INTRAVENOUS | Status: DC | PRN
  Administered 2021-07-27: 10 mL via INTRAVENOUS
  Filled 2021-07-27: qty 10

## 2021-07-27 NOTE — Progress Notes (Signed)
[place]  Telephone:([phone] Fax:([phone]  Patient Care Team: Jodi Marble, MD as PCP - General (Internal Medicine) Clent Jacks, RN as Oncology Nurse Navigator Sindy Guadeloupe, MD as Consulting Physician (Hematology and Oncology)   Name of the patient: Eric Simon  NQ:[id number]  March 01, 1951   Date of visit: 07/27/21  HPI: Patient is a 70 y.o. male with progressive metastatic colon cancer, planned treatment with Xeloda (capecitabine) in conjunction with irinotecan and panitumumab. Treatment scheduled to start next week 08/03/21.  Reason for Consult: Capecitabine oral chemotherapy education.   PAST MEDICAL HISTORY: Past Medical History:  Diagnosis Date   Colon cancer ([place])    COPD (chronic obstructive pulmonary disease) ([place])    Hyperlipidemia     HEMATOLOGY/ONCOLOGY HISTORY:  Oncology History  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 ([place])  06/19/2019 Cancer Staging   Staging form: Colon and Rectum, AJCC 8th Edition - Clinical stage from 06/19/2019: Stage Unknown (cTX, cNX, pM1) - Signed by Sindy Guadeloupe, MD on 06/20/2019   06/20/2019 Initial Diagnosis   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 ([place])   07/02/2019 - 07/01/2021 Chemotherapy          07/21/2021 -  Chemotherapy    Patient is on Treatment Plan: COLORECTAL FOLFIRI + PANITUMUMAB Q14D         ALLERGIES:  has No Known Allergies.  MEDICATIONS:  Current Outpatient Medications  Medication Sig Dispense Refill   aspirin EC 81 MG tablet Take 81 mg by mouth daily.     azelastine (ASTELIN) 0.1 % nasal spray Place 1 spray into both nostrils 1 day or 1 dose.     butalbital-acetaminophen-caffeine (FIORICET) 50-325-40 MG tablet Take 1 tablet by mouth every 4 (four) hours as needed.     capecitabine (XELODA) 500 MG tablet Take 3 tablets (1,500 mg total) by mouth 2 (two) times daily after a meal. Take for 7 days, then hold  for 7 days. Repeat every 14 days. (Patient not taking: Reported on 07/27/2021) 84 tablet 0   cetirizine (ZYRTEC) 10 MG tablet Take 1 tablet (10 mg total) by mouth daily. 30 tablet 0   chlorthalidone (HYGROTON) 25 MG tablet Take 1 tablet by mouth daily.     DEXILANT 60 MG capsule Take 1 capsule by mouth 1 day or 1 dose.     DULoxetine (CYMBALTA) 30 MG capsule Take 1 capsule (30 mg total) by mouth 2 (two) times daily. 60 capsule 1   fluticasone (FLONASE) 50 MCG/ACT nasal spray Place 2 sprays into both nostrils 1 day or 1 dose.      fluticasone (VERAMYST) 27.5 MCG/SPRAY nasal spray Place 2 sprays into the nose daily.     lactulose (CHRONULAC) 10 GM/15ML solution Take 30 mLs (20 g total) by mouth 3 (three) times daily. 8100 mL 1   loperamide (IMODIUM A-D) 2 MG tablet Take 1 tablet (2 mg total) by mouth 4 (four) times daily as needed. Take 2 at diarrhea onset , then 1 every 2hr until 12hrs with no BM. May take 2 every 4hrs at night. If diarrhea recurs repeat. (Patient not taking: Reported on 07/27/2021) 100 tablet 1   magic mouthwash w/lidocaine SOLN Take 5-10 mLs by mouth 4 (four) times daily as needed for mouth pain. (Patient not taking: Reported on 07/27/2021) 480 mL 3   montelukast (SINGULAIR) 10 MG tablet Take 10 mg by mouth at bedtime.     nicotine (NICODERM CQ - DOSED IN  MG/24 HOURS) 21 mg/24hr patch Place 1 patch onto the skin 1 day or 1 dose. (Patient not taking: Reported on 07/27/2021)     oxyCODONE (OXY IR/ROXICODONE) 5 MG immediate release tablet Take 1 tablet (5 mg total) by mouth every 6 (six) hours as needed for severe pain. 30 tablet 0   pantoprazole (PROTONIX) 40 MG tablet Take 40 mg by mouth daily.      potassium chloride SA (K-DUR) 20 MEQ tablet Take 1 tablet (20 mEq total) by mouth daily. (Patient not taking: Reported on 07/27/2021) 14 tablet 0   pregabalin (LYRICA) 75 MG capsule Take 1 capsule (75 mg total) by mouth 2 (two) times daily. 60 capsule 2   simvastatin (ZOCOR) 20 MG tablet Take  20 mg by mouth daily.      SYMBICORT 80-4.5 MCG/ACT inhaler Inhale 2 puffs into the lungs 1 day or 1 dose.      tamsulosin (FLOMAX) 0.4 MG CAPS capsule Take 0.4 mg by mouth.      No current facility-administered medications for this visit.   Facility-Administered Medications Ordered in Other Visits  Medication Dose Route Frequency Provider Last Rate Last Admin   sodium chloride flush (NS) 0.9 % injection 10 mL  10 mL Intravenous PRN Sindy Guadeloupe, MD   10 mL at 07/27/21 0848    VITAL SIGNS: There were no vitals taken for this visit. There were no vitals filed for this visit.  Estimated body mass index is 21.34 kg/m as calculated from the following:   Height as of 06/04/21: '5\' 7"'$  (1.702 m).   Weight as of an earlier encounter on 07/27/21: 61.8 kg (136 lb 4 oz).  LABS: CBC:    Component Value Date/Time   WBC 7.4 07/27/2021 0848   HGB 9.2 (L) 07/27/2021 0848   HGB 16.1 08/18/2012 1321   HCT 29.8 (L) 07/27/2021 0848   HCT 45.4 08/18/2012 1321   PLT 91 (L) 07/27/2021 0848   PLT 192 08/18/2012 1321   MCV 89.2 07/27/2021 0848   MCV 88 08/18/2012 1321   NEUTROABS 5.7 07/27/2021 0848   LYMPHSABS 0.8 07/27/2021 0848   MONOABS 0.7 07/27/2021 0848   EOSABS 0.1 07/27/2021 0848   BASOSABS 0.1 07/27/2021 0848   Comprehensive Metabolic Panel:    Component Value Date/Time   NA 133 (L) 07/27/2021 0848   NA 139 08/18/2012 1321   K 4.1 07/27/2021 0848   K 3.7 08/18/2012 1321   CL 102 07/27/2021 0848   CL 104 08/18/2012 1321   CO2 23 07/27/2021 0848   CO2 27 08/18/2012 1321   BUN 27 (H) 07/27/2021 0848   BUN 18 08/18/2012 1321   CREATININE 0.85 07/27/2021 0848   CREATININE 0.82 08/18/2012 1321   GLUCOSE 96 07/27/2021 0848   GLUCOSE 68 08/18/2012 1321   CALCIUM 8.9 07/27/2021 0848   CALCIUM 9.0 08/18/2012 1321   AST 49 (H) 07/27/2021 0848   AST 36 08/18/2012 1321   ALT 25 07/27/2021 0848   ALT 51 08/18/2012 1321   ALKPHOS 115 07/27/2021 0848   ALKPHOS 88 08/18/2012 1321    BILITOT 0.8 07/27/2021 0848   BILITOT 0.4 08/18/2012 1321   PROT 7.2 07/27/2021 0848   PROT 8.0 08/18/2012 1321   ALBUMIN 3.6 07/27/2021 0848   ALBUMIN 4.1 08/18/2012 1321     Present during today's visit: Patient only  Assessment and Plan: Start plan: Due to patient platelet count, treatment held today. He is scheduled to begin capecitabine next week 08/03/21 along with  his IV treatment.   Patient Education I spoke with patient for overview of new oral chemotherapy medication: Capecitabine  Administration: Counseled patient on administration, dosing, side effects, monitoring, drug-food interactions, safe handling, storage, and disposal. Patient will take 3 tablets (1,500 mg total) by mouth 2 (two) times daily after a meal. Patient will take for 14 days, then hold for 7 days. Repeat every 21 days.  Side Effects: Side effects include but not limited to: Diarrhea, fatigue, mouth sores, hand-foot syndrome, nausea/vomiting, elevated LFTs, decreased WBC/PLT/Hgb.  Diarrhea: Patient reported not having diarrhea previously. Discussed the need of having loperamide on hand with the initiation of capecitabine and irinotecan. He plans to pick up some loperamide. Hand-foot syndrome: We provided patient with a bottle of [place] Smooth extra care 20. Mouth sores: Patient experienced with mouth sores from treatment about two years ago. He will call if mouth sores reoccur and a new prescription for Magic Mouthwash will be sent in.  Drug-drug Interactions (DDI): 1 DDI identified with PPI (pantoprazole and dexlansoprazole). Patient reported not taking either medication and denied acid reflux symptoms. Patient agreed to switch to Pepcid (famotidine). We will discontinue both PPI medications from his medication list.  Adherence: After discussion with patient no patient barriers to medication adherence identified.  Reviewed with patient importance of keeping a medication schedule and plan for any missed  doses.  Mr. Huyett voiced understanding and appreciation. All questions answered. Medication handout provided.  Provided patient with [facility] phone number. Patient knows to call the office with questions or concerns. [facility] will continue to follow.  Patient expressed understanding and was in agreement with this plan. He also understands that He can call clinic at any time with any questions, concerns, or complaints.   Medication Access Issues: None identified. Patient filling at [facility]. Medication delivered to him in clinic today.  Follow-up plan: Follow up in pharmacy clinic in one week 08/03/21.  Thank you for allowing me to participate in the care of this patient.   Time Total: 20 minutes  Visit consisted of counseling and education on dealing with issues of symptom management in the setting of serious and potentially life-threatening illness.Greater than 50%  of this time was spent counseling and coordinating care related to the above assessment and plan.  Signed by: Darl Pikes, PharmD, BCPS, Salley Slaughter, CPP Hematology/Oncology Clinical Pharmacist Practitioner [facility] [phone]  07/27/2021 2:51 PM

## 2021-07-27 NOTE — Progress Notes (Signed)
Hematology/Oncology Consult note [facility]  Telephone:([phone] Fax:([phone]  Patient Care Team: Jodi Marble, MD as PCP - General (Internal Medicine) Clent Jacks, RN as Oncology Nurse Navigator Sindy Guadeloupe, MD as Consulting Physician (Hematology and Oncology)   Name of the patient: Eric Simon  [id number]  05/30/1951   Date of visit: 07/27/21  Diagnosis- metastatic colon cancer with lymph node metastases K-ras/BRAF wild-type    Chief complaint/ Reason for visit-discuss further management of colon cancer and for cycle 1 of panitumumab and irinotecan chemotherapy  Heme/Onc history:  Patient is a 70 yr old male with >40 pack year history of smoking. He currently smokes 0.5ppd. he presented to the ER with symptoms of left sided chest pain and left arm pain. Troponin was negative ekg was unremarkable. Ct chest showed no PE. He was incidentally noted to have mediastinal and retrocrural adenopathy and a 2.1X2.3X4.4 cm retroaortic soft tissue lesion in the posterior left chest. 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  Low-grade activity in the left lateral fifth and sixth ribs associated with nondisplaced fractures likely reflecting healing response problem malignancy.   Patient underwent CT-guided biopsy of the retroperitoneal lymph node pathology showed metastatic adenocarcinoma compatible with colorectal origin.  CK7 negative.  CK20 positive.  CDX 2+.  TTF-1 negative.  PSA negative.  This pattern of immunoreactivity supports the above diagnosis. Patient underwent colonoscopy which showed sigmoid mass that was consistent with adenocarcinoma.  RAS panel testing showed that he was wild-type for both K-ras and BRAF   FOLFOX and bevacizumab chemotherapy started in July 2020.  Subsequently oxaliplatin has been on hold given  neuropathy.  Recent scans have shown slow increase in the size of intra-abdominal adenopathy but patient continues to have low-volume disease.plan is to switch him to second line Xeloda irinotecan cetuximab regimen       Interval history-patient is decided to take a break from 5-FU and pump and would like to proceed with Xeloda.  Denies any specific complaints today other than mild fatigue.  Denies any recent rectal bleeding.  ECOG PS- 1 Pain scale- 0   Review of systems- Review of Systems  Constitutional:  Positive for malaise/fatigue. Negative for chills, fever and weight loss.  HENT:  Negative for congestion, ear discharge and nosebleeds.   Eyes:  Negative for blurred vision.  Respiratory:  Negative for cough, hemoptysis, sputum production, shortness of breath and wheezing.   Cardiovascular:  Negative for chest pain, palpitations, orthopnea and claudication.  Gastrointestinal:  Negative for abdominal pain, blood in stool, constipation, diarrhea, heartburn, melena, nausea and vomiting.  Genitourinary:  Negative for dysuria, flank pain, frequency, hematuria and urgency.  Musculoskeletal:  Negative for back pain, joint pain and myalgias.  Skin:  Negative for rash.  Neurological:  Negative for dizziness, tingling, focal weakness, seizures, weakness and headaches.  Endo/Heme/Allergies:  Does not bruise/bleed easily.  Psychiatric/Behavioral:  Negative for depression and suicidal ideas. The patient does not have insomnia.     No Known Allergies   Past Medical History:  Diagnosis Date   Colon cancer ([place])    COPD (chronic obstructive pulmonary disease) ([place])    Hyperlipidemia      Past Surgical History:  Procedure Laterality Date   COLONOSCOPY WITH PROPOFOL N/A 06/26/2019   Procedure: COLONOSCOPY WITH PROPOFOL;  Surgeon: [name], MD;  Location: [facility] ENDOSCOPY;  Service: Gastroenterology;  Laterality:  N/A;   FLEXIBLE SIGMOIDOSCOPY N/A 05/07/2021   Procedure: FLEXIBLE  SIGMOIDOSCOPY;  Surgeon: [name], MD;  Location: [facility] ENDOSCOPY;  Service: Gastroenterology;  Laterality: N/A;   PORTA CATH INSERTION N/A 06/25/2019   Procedure: PORTA CATH INSERTION;  Surgeon: Algernon Huxley, MD;  Location: [place] CV LAB;  Service: Cardiovascular;  Laterality: N/A;   PORTA CATH INSERTION Left 08/11/2020   Procedure: PORTA CATH INSERTION;  Surgeon: Algernon Huxley, MD;  Location: [place] CV LAB;  Service: Cardiovascular;  Laterality: Left;   PORTA CATH REMOVAL Right 08/11/2020   Procedure: PORTA CATH REMOVAL;  Surgeon: Algernon Huxley, MD;  Location: [place] CV LAB;  Service: Cardiovascular;  Laterality: Right;    Social History   Socioeconomic History   Marital status: Single    Spouse name: Not on file   Number of children: Not on file   Years of education: Not on file   Highest education level: Not on file  Occupational History   Not on file  Tobacco Use   Smoking status: Every Day    Packs/day: 0.50    Years: 40.00    Pack years: 20.00    Types: Cigarettes   Smokeless tobacco: Never   Tobacco comments:    smoking 40 years  Vaping Use   Vaping Use: Never used  Substance and Sexual Activity   Alcohol use: No   Drug use: No   Sexual activity: Not Currently  Other Topics Concern   Not on file  Social History Narrative   Not on file   Social Determinants of Health   Financial Resource Strain: Not on file  Food Insecurity: Not on file  Transportation Needs: Not on file  Physical Activity: Not on file  Stress: Not on file  Social Connections: Not on file  Intimate Partner Violence: Not on file    Family History  Problem Relation Age of Onset   Brain cancer Father      Current Outpatient Medications:    aspirin EC 81 MG tablet, Take 81 mg by mouth daily., Disp: , Rfl:    azelastine (ASTELIN) 0.1 % nasal spray, Place 1 spray into both nostrils 1 day or 1 dose., Disp: , Rfl:    butalbital-acetaminophen-caffeine (FIORICET) 50-325-40 MG  tablet, Take 1 tablet by mouth every 4 (four) hours as needed., Disp: , Rfl:    cetirizine (ZYRTEC) 10 MG tablet, Take 1 tablet (10 mg total) by mouth daily., Disp: 30 tablet, Rfl: 0   chlorthalidone (HYGROTON) 25 MG tablet, Take 1 tablet by mouth daily., Disp: , Rfl:    DULoxetine (CYMBALTA) 30 MG capsule, Take 1 capsule (30 mg total) by mouth 2 (two) times daily., Disp: 60 capsule, Rfl: 1   fluticasone (FLONASE) 50 MCG/ACT nasal spray, Place 2 sprays into both nostrils 1 day or 1 dose. , Disp: , Rfl:    fluticasone (VERAMYST) 27.5 MCG/SPRAY nasal spray, Place 2 sprays into the nose daily., Disp: , Rfl:    lactulose (CHRONULAC) 10 GM/15ML solution, Take 30 mLs (20 g total) by mouth 3 (three) times daily., Disp: 8100 mL, Rfl: 1   montelukast (SINGULAIR) 10 MG tablet, Take 10 mg by mouth at bedtime., Disp: , Rfl:    oxyCODONE (OXY IR/ROXICODONE) 5 MG immediate release tablet, Take 1 tablet (5 mg total) by mouth every 6 (six) hours as needed for severe pain., Disp: 30 tablet, Rfl: 0   pregabalin (LYRICA) 75 MG capsule, Take 1 capsule (75 mg total) by mouth  2 (two) times daily., Disp: 60 capsule, Rfl: 2   simvastatin (ZOCOR) 20 MG tablet, Take 20 mg by mouth daily. , Disp: , Rfl:    SYMBICORT 80-4.5 MCG/ACT inhaler, Inhale 2 puffs into the lungs 1 day or 1 dose. , Disp: , Rfl:    tamsulosin (FLOMAX) 0.4 MG CAPS capsule, Take 0.4 mg by mouth. , Disp: , Rfl:    capecitabine (XELODA) 500 MG tablet, Take 3 tablets (1,500 mg total) by mouth 2 (two) times daily after a meal. Take for 14 days, then hold for 7 days. Repeat every 21 days., Disp: 84 tablet, Rfl: 0   FAMOTIDINE PO, Take by mouth. Take as needed., Disp: , Rfl:    loperamide (IMODIUM A-D) 2 MG tablet, Take 1 tablet (2 mg total) by mouth 4 (four) times daily as needed. Take 2 at diarrhea onset , then 1 every 2hr until 12hrs with no BM. May take 2 every 4hrs at night. If diarrhea recurs repeat. (Patient not taking: Reported on 07/27/2021), Disp: 100  tablet, Rfl: 1   magic mouthwash w/lidocaine SOLN, Take 5-10 mLs by mouth 4 (four) times daily as needed for mouth pain. (Patient not taking: Reported on 07/27/2021), Disp: 480 mL, Rfl: 3   nicotine (NICODERM CQ - DOSED IN MG/24 HOURS) 21 mg/24hr patch, Place 1 patch onto the skin 1 day or 1 dose. (Patient not taking: Reported on 07/27/2021), Disp: , Rfl:    potassium chloride SA (K-DUR) 20 MEQ tablet, Take 1 tablet (20 mEq total) by mouth daily. (Patient not taking: Reported on 07/27/2021), Disp: 14 tablet, Rfl: 0  Physical exam:  Physical Exam Constitutional:      General: He is not in acute distress. Cardiovascular:     Rate and Rhythm: Normal rate and regular rhythm.     Heart sounds: Normal heart sounds.  Pulmonary:     Effort: Pulmonary effort is normal.     Breath sounds: Normal breath sounds.  Skin:    General: Skin is warm and dry.  Neurological:     Mental Status: He is alert and oriented to person, place, and time.     CMP Latest Ref Rng & Units 07/27/2021  Glucose 70 - 99 mg/dL 96  BUN 8 - 23 mg/dL 27(H)  Creatinine 0.61 - 1.24 mg/dL 0.85  Sodium 135 - 145 mmol/L 133(L)  Potassium 3.5 - 5.1 mmol/L 4.1  Chloride 98 - 111 mmol/L 102  CO2 22 - 32 mmol/L 23  Calcium 8.9 - 10.3 mg/dL 8.9  Total Protein 6.5 - 8.1 g/dL 7.2  Total Bilirubin 0.3 - 1.2 mg/dL 0.8  Alkaline Phos 38 - 126 U/L 115  AST 15 - 41 U/L 49(H)  ALT 0 - 44 U/L 25   CBC Latest Ref Rng & Units 07/27/2021  WBC 4.0 - 10.5 K/uL 7.4  Hemoglobin 13.0 - 17.0 g/dL 9.2(L)  Hematocrit 39.0 - 52.0 % 29.8(L)  Platelets 150 - 400 K/uL 91(L)    No images are attached to the encounter.  CT CHEST ABDOMEN PELVIS W CONTRAST  Result Date: 07/17/2021 CLINICAL DATA:  Colon cancer diagnosed in July of 2020. Treated with chemotherapy. Currently asymptomatic. EXAM: CT CHEST, ABDOMEN, AND PELVIS WITH CONTRAST TECHNIQUE: Multidetector CT imaging of the chest, abdomen and pelvis was performed following the standard protocol  during bolus administration of intravenous contrast. CONTRAST:  135m OMNIPAQUE IOHEXOL 350 MG/ML SOLN COMPARISON:  04/01/2021 FINDINGS: CT CHEST FINDINGS Cardiovascular: Aortic atherosclerosis. Tortuous thoracic aorta. Normal heart size, without pericardial  effusion. Lad coronary artery calcification. No central pulmonary embolism, on this non-dedicated study. Left Port-A-Cath tip at high right atrium. Mediastinum/Nodes: No mediastinal or hilar adenopathy. Lungs/Pleura: No pleural fluid.  Moderate centrilobular emphysema. Minimal motion degradation superiorly. Dependent subsegmental atelectasis in both lower lobes. No findings of pulmonary metastasis. Musculoskeletal: No acute osseous abnormality. CT ABDOMEN PELVIS FINDINGS Hepatobiliary: Caudate enlargement and subtle medial segment left liver lobe atrophy. No focal liver lesion. Normal gallbladder, without biliary ductal dilatation. Pancreas: Normal, without mass or ductal dilatation. Spleen: Mild splenomegaly including at 13.3 cm, chronic. Adrenals/Urinary Tract: Normal adrenal glands. Too small to characterize lesions in both kidneys. No hydronephrosis. Normal urinary bladder. Stomach/Bowel: The gastric antrum appears thick walled, but is underdistended including on 59/2. Scattered colonic diverticula. The previously described rectal wall thickening is no longer appreciated. No well-defined residual mass. Normal terminal ileum and appendix. Normal small bowel. Vascular/Lymphatic: Advanced aortic and branch vessel atherosclerosis. No specific evidence of portal venous hypertension. Patent portal and splenic veins. Pre caval node measures 6 mm on 85/2 (Previously 5 mm.) Aortocaval node measures 8 mm on 75/2 (Previously 5 mm.) More anterior aortocaval node measures 8 mm on 72/2 (Previously 5 mm.) Left external iliac index node measures 1.5 x 2.2 cm on 108/2 (Previously 1.3 x 1.5 cm.) More central external iliac node measures 1.4 cm on 104/2 (Previously 9 mm.)  Reproductive: Mild prostatomegaly. Other: Trace cul-de-sac fluid, new or increased. No evidence of omental or peritoneal disease. Periumbilical tiny fat and fluid containing hernia without bowel involvement. Musculoskeletal: Avascular necrosis of both femoral heads. Degenerate disc disease at the lumbosacral junction. IMPRESSION: 1. Mild progression of abdominopelvic adenopathy, highly suspicious for nodal metastasis. 2. Mild cirrhosis and persistent splenomegaly. New or increased trace cul-de-sac fluid. 3.  No acute process or evidence of metastatic disease in the chest. 4. Aortic atherosclerosis (ICD10-I70.0), coronary artery atherosclerosis and emphysema (ICD10-J43.9). 5. Apparent gastric antral wall thickening is most likely due to underdistention. Recommend clinical correlation for gastritis. 6. Chronic avascular necrosis of both femoral heads. Electronically Signed   By: Abigail Miyamoto M.D.   On: 07/17/2021 14:45     Assessment and plan- Patient is a 70 y.o. male with metastatic K-ras wild-type colon cancer with intra-abdominal lymph node metastases here to discuss further management and for cycle 1 of panitumumab and underwent he can     Patient has been heavily pretreated for his colon cancer and has thrombocytopenia likely secondary to prior oxaliplatin as well as splenomegaly.  Today's platelet count is 91.  I will therefore hold off on starting treatment today and see if it would be more than 100 and about a week's time.  I will plan to start him on Xeloda at that time 1500 mg twice daily 2 weeks on and 1 week off.  Discussed risks and benefits of Xeloda including all but not limited to nausea, vomiting, abdominal pain, skin rash and diarrhea.  This will be given instead of infusional 5-FU chemotherapy.  He will return to clinic in 1 week and receive irinotecan at a reduced dose of 150 mg per metered squared as well as panitumumab on that day.  Ideally this needs to be given every 2 weeks but given  his baseline anemia and thrombocytopenia I will plan to give it every 3 weeks coinciding with his Xeloda  Based on labs today he also has evidence of iron deficiency and therefore will require IV iron.  Based on what his insurance will approve I will decide if he  can get Feraheme versus Venofer.  I will see him back in 4 weeks for cycle 2 of panitumumab plus irinotecan    Visit Diagnosis 1. Chemotherapy-induced thrombocytopenia   2. High risk medication use   3. 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 ([place])   4. Iron deficiency anemia, unspecified iron deficiency anemia type      Dr. Randa Evens, MD, MPH [facility] [phone] 07/27/2021 4:06 PM

## 2021-07-27 NOTE — Addendum Note (Signed)
Addended by: Luella Cook on: 07/27/2021 10:29 AM   Modules accepted: Orders

## 2021-07-29 ENCOUNTER — Inpatient Hospital Stay: Payer: Medicare HMO

## 2021-07-29 ENCOUNTER — Other Ambulatory Visit (HOSPITAL_COMMUNITY): Payer: Self-pay

## 2021-07-30 ENCOUNTER — Other Ambulatory Visit: Payer: Self-pay | Admitting: *Deleted

## 2021-07-30 ENCOUNTER — Inpatient Hospital Stay: Payer: Medicare HMO

## 2021-07-30 VITALS — BP 120/58 | HR 76 | Temp 96.9°F | Resp 18

## 2021-07-30 DIAGNOSIS — D508 Other iron deficiency anemias: Secondary | ICD-10-CM

## 2021-07-30 DIAGNOSIS — C187 Malignant neoplasm of sigmoid colon: Secondary | ICD-10-CM | POA: Diagnosis not present

## 2021-07-30 DIAGNOSIS — Z5111 Encounter for antineoplastic chemotherapy: Secondary | ICD-10-CM | POA: Diagnosis not present

## 2021-07-30 DIAGNOSIS — D6959 Other secondary thrombocytopenia: Secondary | ICD-10-CM | POA: Diagnosis not present

## 2021-07-30 DIAGNOSIS — Z452 Encounter for adjustment and management of vascular access device: Secondary | ICD-10-CM | POA: Diagnosis not present

## 2021-07-30 DIAGNOSIS — C772 Secondary and unspecified malignant neoplasm of intra-abdominal lymph nodes: Secondary | ICD-10-CM | POA: Diagnosis not present

## 2021-07-30 DIAGNOSIS — D509 Iron deficiency anemia, unspecified: Secondary | ICD-10-CM | POA: Diagnosis not present

## 2021-07-30 DIAGNOSIS — R161 Splenomegaly, not elsewhere classified: Secondary | ICD-10-CM | POA: Diagnosis not present

## 2021-07-30 DIAGNOSIS — Z5112 Encounter for antineoplastic immunotherapy: Secondary | ICD-10-CM | POA: Diagnosis not present

## 2021-07-30 MED ORDER — IRON SUCROSE 20 MG/ML IV SOLN
200.0000 mg | Freq: Once | INTRAVENOUS | Status: AC
  Administered 2021-07-30: 200 mg via INTRAVENOUS
  Filled 2021-07-30: qty 10

## 2021-07-30 MED ORDER — SODIUM CHLORIDE 0.9 % IV SOLN
Freq: Once | INTRAVENOUS | Status: AC
  Filled 2021-07-30: qty 250

## 2021-07-30 MED ORDER — SODIUM CHLORIDE 0.9 % IV SOLN: 200.0000 mg | INTRAVENOUS | Status: DC

## 2021-07-30 NOTE — Progress Notes (Signed)
Pt did not need refill of zofran

## 2021-07-30 NOTE — Patient Instructions (Signed)
[facility]  Discharge Instructions: Thank you for choosing [facility] to provide your oncology and hematology care.  If you have a lab appointment with the Cancer Center, please go directly to the Cancer Center and check in at the registration area.  Wear comfortable clothing and clothing appropriate for easy access to any Portacath or PICC line.   We strive to give you quality time with your provider. You may need to reschedule your appointment if you arrive late (15 or more minutes).  Arriving late affects you and other patients whose appointments are after yours.  Also, if you miss three or more appointments without notifying the office, you may be dismissed from the clinic at the provider's discretion.      For prescription refill requests, have your pharmacy contact our office and allow 72 hours for refills to be completed.    Today you received the following chemotherapy and/or immunotherapy agents VENOFER      To help prevent nausea and vomiting after your treatment, we encourage you to take your nausea medication as directed.  BELOW ARE SYMPTOMS THAT SHOULD BE REPORTED IMMEDIATELY: *FEVER GREATER THAN 100.4 F (38 C) OR HIGHER *CHILLS OR SWEATING *NAUSEA AND VOMITING THAT IS NOT CONTROLLED WITH YOUR NAUSEA MEDICATION *UNUSUAL SHORTNESS OF BREATH *UNUSUAL BRUISING OR BLEEDING *URINARY PROBLEMS (pain or burning when urinating, or frequent urination) *BOWEL PROBLEMS (unusual diarrhea, constipation, pain near the anus) TENDERNESS IN MOUTH AND THROAT WITH OR WITHOUT PRESENCE OF ULCERS (sore throat, sores in mouth, or a toothache) UNUSUAL RASH, SWELLING OR PAIN  UNUSUAL VAGINAL DISCHARGE OR ITCHING   Items with * indicate a potential emergency and should be followed up as soon as possible or go to the Emergency Department if any problems should occur.  Please show the CHEMOTHERAPY ALERT CARD or IMMUNOTHERAPY ALERT CARD at check-in to  the Emergency Department and triage nurse.  Should you have questions after your visit or need to cancel or reschedule your appointment, please contact [facility]  [phone] and follow the prompts.  Office hours are 8:00 a.m. to 4:30 p.m. Monday - Friday. Please note that voicemails left after 4:00 p.m. may not be returned until the following business day.  We are closed weekends and major holidays. You have access to a nurse at all times for urgent questions. Please call the main number to the clinic [phone] and follow the prompts.  For any non-urgent questions, you may also contact your provider using MyChart. We now offer e-Visits for anyone 18 and older to request care online for non-urgent symptoms. For details visit [email or web address].   Also download the MyChart app! Go to the app store, search "MyChart", open the app, select [facility], and log in with your MyChart username and password.  Due to Covid, a mask is required upon entering the hospital/clinic. If you do not have a mask, one will be given to you upon arrival. For doctor visits, patients may have 1 support person aged 18 or older with them. For treatment visits, patients cannot have anyone with them due to current Covid guidelines and our immunocompromised population.   Iron Sucrose injection What is this medication? IRON SUCROSE (AHY ern SOO krohs) is an iron complex. Iron is used to make healthy red blood cells, which carry oxygen and nutrients throughout the body. This medicine is used to treat iron deficiency anemia in people with chronickidney disease. This medicine may be used for other   purposes; ask your health care provider orpharmacist if you have questions. COMMON BRAND NAME(S): Venofer What should I tell my care team before I take this medication? They need to know if you have any of these conditions: anemia not caused by low iron levels heart disease high levels of  iron in the blood kidney disease liver disease an unusual or allergic reaction to iron, other medicines, foods, dyes, or preservatives pregnant or trying to get pregnant breast-feeding How should I use this medication? This medicine is for infusion into a vein. It is given by a health careprofessional in a hospital or clinic setting. Talk to your pediatrician regarding the use of this medicine in children. While this drug may be prescribed for children as young as 2 years for selectedconditions, precautions do apply. Overdosage: If you think you have taken too much of this medicine contact apoison control center or emergency room at once. NOTE: This medicine is only for you. Do not share this medicine with others. What if I miss a dose? It is important not to miss your dose. Call your doctor or health careprofessional if you are unable to keep an appointment. What may interact with this medication? Do not take this medicine with any of the following medications: deferoxamine dimercaprol other iron products This medicine may also interact with the following medications: chloramphenicol deferasirox This list may not describe all possible interactions. Give your health care provider a list of all the medicines, herbs, non-prescription drugs, or dietary supplements you use. Also tell them if you smoke, drink alcohol, or use illegaldrugs. Some items may interact with your medicine. What should I watch for while using this medication? Visit your doctor or healthcare professional regularly. Tell your doctor or healthcare professional if your symptoms do not start to get better or if theyget worse. You may need blood work done while you are taking this medicine. You may need to follow a special diet. Talk to your doctor. Foods that contain iron include: whole grains/cereals, dried fruits, beans, or peas, leafy greenvegetables, and organ meats (liver, kidney). What side effects may I notice from  receiving this medication? Side effects that you should report to your doctor or health care professionalas soon as possible: allergic reactions like skin rash, itching or hives, swelling of the face, lips, or tongue breathing problems changes in blood pressure cough fast, irregular heartbeat feeling faint or lightheaded, falls fever or chills flushing, sweating, or hot feelings joint or muscle aches/pains seizures swelling of the ankles or feet unusually weak or tired Side effects that usually do not require medical attention (report to yourdoctor or health care professional if they continue or are bothersome): diarrhea feeling achy headache irritation at site where injected nausea, vomiting stomach upset tiredness This list may not describe all possible side effects. Call your doctor for medical advice about side effects. You may report side effects to FDA at1-800-FDA-1088. Where should I keep my medication? This drug is given in a hospital or clinic and will not be stored at home. NOTE: This sheet is a summary. It may not cover all possible information. If you have questions about this medicine, talk to your doctor, pharmacist, orhealth care provider.  2022 Elsevier/Gold Standard (2011-09-09 17:14:35)  

## 2021-07-31 ENCOUNTER — Other Ambulatory Visit (HOSPITAL_COMMUNITY): Payer: Self-pay

## 2021-08-03 ENCOUNTER — Inpatient Hospital Stay: Payer: Medicare HMO | Admitting: Pharmacist

## 2021-08-03 ENCOUNTER — Ambulatory Visit: Payer: Medicare HMO

## 2021-08-03 ENCOUNTER — Encounter: Payer: Self-pay | Admitting: Oncology

## 2021-08-03 ENCOUNTER — Other Ambulatory Visit: Payer: Medicare HMO

## 2021-08-03 ENCOUNTER — Ambulatory Visit: Payer: Medicare HMO | Admitting: Oncology

## 2021-08-03 ENCOUNTER — Inpatient Hospital Stay: Payer: Medicare HMO

## 2021-08-03 ENCOUNTER — Inpatient Hospital Stay (HOSPITAL_BASED_OUTPATIENT_CLINIC_OR_DEPARTMENT_OTHER): Payer: Medicare HMO | Admitting: Oncology

## 2021-08-03 VITALS — BP 125/72 | HR 72 | Temp 97.5°F | Resp 16 | Ht 67.0 in | Wt 135.5 lb

## 2021-08-03 VITALS — BP 98/51 | HR 68

## 2021-08-03 DIAGNOSIS — Z95828 Presence of other vascular implants and grafts: Secondary | ICD-10-CM

## 2021-08-03 DIAGNOSIS — C772 Secondary and unspecified malignant neoplasm of intra-abdominal lymph nodes: Secondary | ICD-10-CM | POA: Diagnosis not present

## 2021-08-03 DIAGNOSIS — Z5111 Encounter for antineoplastic chemotherapy: Secondary | ICD-10-CM

## 2021-08-03 DIAGNOSIS — C189 Malignant neoplasm of colon, unspecified: Secondary | ICD-10-CM

## 2021-08-03 DIAGNOSIS — C187 Malignant neoplasm of sigmoid colon: Secondary | ICD-10-CM | POA: Diagnosis not present

## 2021-08-03 DIAGNOSIS — D509 Iron deficiency anemia, unspecified: Secondary | ICD-10-CM

## 2021-08-03 DIAGNOSIS — Z452 Encounter for adjustment and management of vascular access device: Secondary | ICD-10-CM | POA: Diagnosis not present

## 2021-08-03 DIAGNOSIS — D6959 Other secondary thrombocytopenia: Secondary | ICD-10-CM | POA: Diagnosis not present

## 2021-08-03 DIAGNOSIS — T451X5A Adverse effect of antineoplastic and immunosuppressive drugs, initial encounter: Secondary | ICD-10-CM | POA: Diagnosis not present

## 2021-08-03 DIAGNOSIS — Z5112 Encounter for antineoplastic immunotherapy: Secondary | ICD-10-CM | POA: Diagnosis not present

## 2021-08-03 DIAGNOSIS — Z79899 Other long term (current) drug therapy: Secondary | ICD-10-CM

## 2021-08-03 DIAGNOSIS — D508 Other iron deficiency anemias: Secondary | ICD-10-CM

## 2021-08-03 DIAGNOSIS — R161 Splenomegaly, not elsewhere classified: Secondary | ICD-10-CM | POA: Diagnosis not present

## 2021-08-03 LAB — CBC WITH DIFFERENTIAL/PLATELET
Abs Immature Granulocytes: 0.02 10*3/uL (ref 0.00–0.07)
Basophils Absolute: 0 10*3/uL (ref 0.0–0.1)
Basophils Relative: 1 %
Eosinophils Absolute: 0.2 10*3/uL (ref 0.0–0.5)
Eosinophils Relative: 3 %
HCT: 31.1 % — ABNORMAL LOW (ref 39.0–52.0)
Hemoglobin: 9.7 g/dL — ABNORMAL LOW (ref 13.0–17.0)
Immature Granulocytes: 0 %
Lymphocytes Relative: 24 %
Lymphs Abs: 1.4 10*3/uL (ref 0.7–4.0)
MCH: 27.5 pg (ref 26.0–34.0)
MCHC: 31.2 g/dL (ref 30.0–36.0)
MCV: 88.1 fL (ref 80.0–100.0)
Monocytes Absolute: 0.6 10*3/uL (ref 0.1–1.0)
Monocytes Relative: 10 %
Neutro Abs: 3.6 10*3/uL (ref 1.7–7.7)
Neutrophils Relative %: 62 %
Platelets: 130 10*3/uL — ABNORMAL LOW (ref 150–400)
RBC: 3.53 MIL/uL — ABNORMAL LOW (ref 4.22–5.81)
RDW: 17.7 % — ABNORMAL HIGH (ref 11.5–15.5)
WBC: 5.8 10*3/uL (ref 4.0–10.5)
nRBC: 0 % (ref 0.0–0.2)

## 2021-08-03 LAB — COMPREHENSIVE METABOLIC PANEL
ALT: 20 U/L (ref 0–44)
AST: 38 U/L (ref 15–41)
Albumin: 3.5 g/dL (ref 3.5–5.0)
Alkaline Phosphatase: 126 U/L (ref 38–126)
Anion gap: 7 (ref 5–15)
BUN: 21 mg/dL (ref 8–23)
CO2: 23 mmol/L (ref 22–32)
Calcium: 8.5 mg/dL — ABNORMAL LOW (ref 8.9–10.3)
Chloride: 104 mmol/L (ref 98–111)
Creatinine, Ser: 0.95 mg/dL (ref 0.61–1.24)
GFR, Estimated: >60 mL/min (ref >60)
Glucose, Bld: 96 mg/dL (ref 70–99)
Potassium: 3.8 mmol/L (ref 3.5–5.1)
Sodium: 134 mmol/L — ABNORMAL LOW (ref 135–145)
Total Bilirubin: 0.5 mg/dL (ref 0.3–1.2)
Total Protein: 7.2 g/dL (ref 6.5–8.1)

## 2021-08-03 LAB — MAGNESIUM: Magnesium: 2.1 mg/dL (ref 1.7–2.4)

## 2021-08-03 MED ORDER — HEPARIN SOD (PORK) LOCK FLUSH 100 UNIT/ML IV SOLN
500.0000 [IU] | Freq: Once | INTRAVENOUS | Status: AC
  Administered 2021-08-03: 500 [IU] via INTRAVENOUS
  Filled 2021-08-03: qty 5

## 2021-08-03 MED ORDER — HEPARIN SOD (PORK) LOCK FLUSH 100 UNIT/ML IV SOLN
INTRAVENOUS | Status: AC
  Filled 2021-08-03: qty 5

## 2021-08-03 MED ORDER — HEPARIN SOD (PORK) LOCK FLUSH 100 UNIT/ML IV SOLN
500.0000 [IU] | Freq: Once | INTRAVENOUS | Status: DC | PRN
  Filled 2021-08-03: qty 5

## 2021-08-03 MED ORDER — ATROPINE SULFATE 1 MG/ML IJ SOLN
0.5000 mg | Freq: Once | INTRAMUSCULAR | Status: AC | PRN
  Administered 2021-08-03: 0.5 mg via INTRAVENOUS
  Filled 2021-08-03: qty 1

## 2021-08-03 MED ORDER — SODIUM CHLORIDE 0.9 % IV SOLN: 200.0000 mg | INTRAVENOUS | Status: DC

## 2021-08-03 MED ORDER — SODIUM CHLORIDE 0.9 % IV SOLN
Freq: Once | INTRAVENOUS | Status: AC
  Filled 2021-08-03: qty 250

## 2021-08-03 MED ORDER — SODIUM CHLORIDE 0.9 % IV SOLN
6.0000 mg/kg | Freq: Once | INTRAVENOUS | Status: AC
  Administered 2021-08-03: 400 mg via INTRAVENOUS
  Filled 2021-08-03: qty 20

## 2021-08-03 MED ORDER — IRON SUCROSE 20 MG/ML IV SOLN
200.0000 mg | Freq: Once | INTRAVENOUS | Status: AC
  Administered 2021-08-03: 200 mg via INTRAVENOUS
  Filled 2021-08-03: qty 10

## 2021-08-03 MED ORDER — PALONOSETRON HCL INJECTION 0.25 MG/5ML
0.2500 mg | Freq: Once | INTRAVENOUS | Status: AC
  Administered 2021-08-03: 0.25 mg via INTRAVENOUS
  Filled 2021-08-03: qty 5

## 2021-08-03 MED ORDER — SODIUM CHLORIDE 0.9 % IV SOLN
150.0000 mg/m2 | Freq: Once | INTRAVENOUS | Status: AC
  Administered 2021-08-03: 260 mg via INTRAVENOUS
  Filled 2021-08-03: qty 10

## 2021-08-03 MED ORDER — SODIUM CHLORIDE 0.9 % IV SOLN
10.0000 mg | Freq: Once | INTRAVENOUS | Status: AC
  Administered 2021-08-03: 10 mg via INTRAVENOUS
  Filled 2021-08-03: qty 10

## 2021-08-03 MED ORDER — SODIUM CHLORIDE 0.9% FLUSH
10.0000 mL | Freq: Once | INTRAVENOUS | Status: AC
  Administered 2021-08-03: 10 mL via INTRAVENOUS
  Filled 2021-08-03: qty 10

## 2021-08-03 NOTE — Progress Notes (Signed)
[place]  Telephone:([phone] Fax:([phone]  Patient Care Team: Jodi Marble, MD as PCP - General (Internal Medicine) Clent Jacks, RN as Oncology Nurse Navigator Sindy Guadeloupe, MD as Consulting Physician (Hematology and Oncology)   Name of the patient: Eric Simon  [id number]  1951-07-30   Date of visit: 08/03/21  HPI: Patient is a 70 y.o. male with progressive metastatic colon cancer, planned treatment with Xeloda (capecitabine) in conjunction with irinotecan and panitumumab. Treatment scheduled to start today, 08/03/21.  Reason for Consult: Capecitabine oral chemotherapy re-education.   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  MEDICATIONS:  Current Outpatient Medications  Medication Sig Dispense Refill   allopurinol (ZYLOPRIM) 100 MG tablet      aspirin EC 81 MG tablet Take 81 mg by mouth daily.     azelastine (ASTELIN) 0.1 % nasal spray Place 1 spray into both nostrils 1 day or 1 dose.     butalbital-acetaminophen-caffeine (FIORICET) 50-325-40 MG tablet Take 1 tablet by mouth every 4 (four) hours as needed.     capecitabine (XELODA) 500 MG tablet Take 3 tablets (1,500 mg total) by mouth 2 (two) times  daily after a meal. Take for 14 days, then hold for 7 days. Repeat every 21 days. 84 tablet 0   cetirizine (ZYRTEC) 10 MG tablet Take 1 tablet (10 mg total) by mouth daily. 30 tablet 0   chlorthalidone (HYGROTON) 25 MG tablet Take 1 tablet by mouth daily.     DULoxetine (CYMBALTA) 30 MG capsule Take 1 capsule (30 mg total) by mouth 2 (two) times daily. 60 capsule 1   FAMOTIDINE PO Take by mouth. Take as needed.     fluticasone (FLONASE) 50 MCG/ACT nasal spray Place 2 sprays into both nostrils 1 day or 1 dose.      fluticasone (VERAMYST) 27.5 MCG/SPRAY nasal spray Place 2 sprays into the nose daily.     lactulose (CHRONULAC) 10 GM/15ML solution Take 30 mLs (20 g total) by mouth 3 (three) times daily. (Patient not taking: Reported on 08/03/2021) 8100 mL 1   loperamide (IMODIUM A-D) 2 MG tablet Take 1 tablet (2 mg total) by mouth 4 (four) times daily as needed. Take 2 at diarrhea onset , then 1 every 2hr until 12hrs with no BM. May take 2 every 4hrs at night. If diarrhea recurs repeat. (Patient not taking: No sig reported) 100 tablet 1   magic mouthwash w/lidocaine SOLN Take 5-10 mLs by mouth 4 (four) times daily as needed for mouth pain. (Patient not taking: No sig reported) 480 mL 3   montelukast (SINGULAIR) 10 MG tablet Take 10 mg by mouth at bedtime.     NEXLETOL 180 MG  TABS Take 1 tablet by mouth daily.     nicotine (NICODERM CQ - DOSED IN MG/24 HOURS) 21 mg/24hr patch Place 1 patch onto the skin 1 day or 1 dose. (Patient not taking: No sig reported)     oxyCODONE (OXY IR/ROXICODONE) 5 MG immediate release tablet Take 1 tablet (5 mg total) by mouth every 6 (six) hours as needed for severe pain. (Patient not taking: Reported on 08/03/2021) 30 tablet 0   potassium chloride SA (K-DUR) 20 MEQ tablet Take 1 tablet (20 mEq total) by mouth daily. (Patient not taking: No sig reported) 14 tablet 0   pregabalin (LYRICA) 75 MG capsule Take 1 capsule (75 mg total) by mouth 2 (two) times daily. 60 capsule 2    simvastatin (ZOCOR) 20 MG tablet Take 20 mg by mouth daily.      SYMBICORT 80-4.5 MCG/ACT inhaler Inhale 2 puffs into the lungs 1 day or 1 dose.      tamsulosin (FLOMAX) 0.4 MG CAPS capsule Take 0.4 mg by mouth.      No current facility-administered medications for this visit.   Facility-Administered Medications Ordered in Other Visits  Medication Dose Route Frequency Provider Last Rate Last Admin   heparin lock flush 100 UNIT/ML injection            heparin lock flush 100 unit/mL  500 Units Intracatheter Once PRN Sindy Guadeloupe, MD        VITAL SIGNS: There were no vitals taken for this visit. There were no vitals filed for this visit.  Estimated body mass index is 21.22 kg/m as calculated from the following:   Height as of an earlier encounter on 08/03/21: '5\' 7"'$  (1.702 m).   Weight as of an earlier encounter on 08/03/21: 61.5 kg (135 lb 8 oz).  LABS: CBC:    Component Value Date/Time   WBC 5.8 08/03/2021 0852   HGB 9.7 (L) 08/03/2021 0852   HGB 16.1 08/18/2012 1321   HCT 31.1 (L) 08/03/2021 0852   HCT 45.4 08/18/2012 1321   PLT 130 (L) 08/03/2021 0852   PLT 192 08/18/2012 1321   MCV 88.1 08/03/2021 0852   MCV 88 08/18/2012 1321   NEUTROABS 3.6 08/03/2021 0852   LYMPHSABS 1.4 08/03/2021 0852   MONOABS 0.6 08/03/2021 0852   EOSABS 0.2 08/03/2021 0852   BASOSABS 0.0 08/03/2021 0852   Comprehensive Metabolic Panel:    Component Value Date/Time   NA 134 (L) 08/03/2021 0852   NA 139 08/18/2012 1321   K 3.8 08/03/2021 0852   K 3.7 08/18/2012 1321   CL 104 08/03/2021 0852   CL 104 08/18/2012 1321   CO2 23 08/03/2021 0852   CO2 27 08/18/2012 1321   BUN 21 08/03/2021 0852   BUN 18 08/18/2012 1321   CREATININE 0.95 08/03/2021 0852   CREATININE 0.82 08/18/2012 1321   GLUCOSE 96 08/03/2021 0852   GLUCOSE 68 08/18/2012 1321   CALCIUM 8.5 (L) 08/03/2021 0852   CALCIUM 9.0 08/18/2012 1321   AST 38 08/03/2021 0852   AST 36 08/18/2012 1321   ALT 20 08/03/2021 0852   ALT 51  08/18/2012 1321   ALKPHOS 126 08/03/2021 0852   ALKPHOS 88 08/18/2012 1321   BILITOT 0.5 08/03/2021 0852   BILITOT 0.4 08/18/2012 1321   PROT 7.2 08/03/2021 0852   PROT 8.0 08/18/2012 1321   ALBUMIN 3.5 08/03/2021 0852   ALBUMIN 4.1 08/18/2012 1321     Present during today's visit: Patient only  Assessment and Plan:  Start plan: Mr. Cardullo took his first dose of capecitabine today 08/03/21 as scheduled   Patient Education Provided Mr. Garbo with a medication to help him keep track of his capecitabine schedule.  He will take 3 tablets (1,500 mg total) by mouth 2 (two) times daily after a meal. Patient will take for 14 days, then hold for 7 days. Repeat every 21 days.  Side Effects: Side effects include but not limited to: Diarrhea, fatigue, mouth sores, hand-foot syndrome, nausea/vomiting, elevated LFTs, decreased WBC/PLT/Hgb.  Hand-foot syndrome: reminded him to use the provided bottle of [place] Smooth extra care 20. Mouth sores: He will call if mouth sores reoccur and a new prescription for Magic Mouthwash will be sent in.  Patient expressed understanding and was in agreement with this plan. He also understands that He can call clinic at any time with any questions, concerns, or complaints.   Medication Access Issues: None identified. Patient filling at [facility].   Follow-up plan: Follow up in pharmacy clinic in one week, will see pt in infusion  Thank you for allowing me to participate in the care of this patient.   Time Total: 10 minutes  Visit consisted of counseling and education on dealing with issues of symptom management in the setting of serious and potentially life-threatening illness.Greater than 50%  of this time was spent counseling and coordinating care related to the above assessment and plan.  Signed by: Darl Pikes, PharmD, BCPS, Salley Slaughter, CPP Hematology/Oncology Clinical Pharmacist Practitioner [facility] [phone]  08/03/2021 3:31 PM

## 2021-08-03 NOTE — Progress Notes (Signed)
I connected with Gwenlyn Perking on 08/03/21 at  9:30 AM EDT by video enabled telemedicine visit and verified that I am speaking with the correct person using two identifiers.   I discussed the limitations, risks, security and privacy concerns of performing an evaluation and management service by telemedicine and the availability of in-person appointments. I also discussed with the patient that there may be a patient responsible charge related to this service. The patient expressed understanding and agreed to proceed.  Other persons participating in the visit and their role in the encounter:  none  Patient's location:  cancer center Provider's location:  home  Chief Complaint:  on treatment assessment prior to cycle 1 of irinotecan/panitumumab  History of present illness: Patient is a 70 yr old male with >40 pack year history of smoking. He currently smokes 0.5ppd. he presented to the ER with symptoms of left sided chest pain and left arm pain. Troponin was negative ekg was unremarkable. Ct chest showed no PE. He was incidentally noted to have mediastinal and retrocrural adenopathy and a 2.1X2.3X4.4 cm retroaortic soft tissue lesion in the posterior left chest. 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  Low-grade activity in the left lateral fifth and sixth ribs associated with nondisplaced fractures likely reflecting healing response problem malignancy.   Patient underwent CT-guided biopsy of the retroperitoneal lymph node pathology showed metastatic adenocarcinoma compatible with colorectal origin.  CK7 negative.  CK20 positive.  CDX 2+.  TTF-1 negative.  PSA negative.  This pattern of immunoreactivity supports the above diagnosis. Patient underwent colonoscopy which showed sigmoid mass that was consistent with adenocarcinoma.  RAS panel testing showed that he was wild-type for both K-ras  and BRAF   FOLFOX and bevacizumab chemotherapy started in July 2020.  Subsequently oxaliplatin has been on hold given neuropathy.  Recent scans have shown slow increase in the size of intra-abdominal adenopathy but patient continues to have low-volume disease.plan is to switch him to second line Xeloda irinotecan cetuximab regimen     Interval history: Reports baseline chronic fatigue. Neuropathy is stable. Weight has remained stable. He is having regular bowel movements. Denies any rectal bleeding   Review of Systems  Constitutional:  Negative for chills, fever, malaise/fatigue and weight loss.  HENT:  Negative for congestion, ear discharge and nosebleeds.   Eyes:  Negative for blurred vision.  Respiratory:  Negative for cough, hemoptysis, sputum production, shortness of breath and wheezing.   Cardiovascular:  Negative for chest pain, palpitations, orthopnea and claudication.  Gastrointestinal:  Negative for abdominal pain, blood in stool, constipation, diarrhea, heartburn, melena, nausea and vomiting.  Genitourinary:  Negative for dysuria, flank pain, frequency, hematuria and urgency.  Musculoskeletal:  Negative for back pain, joint pain and myalgias.  Skin:  Negative for rash.  Neurological:  Negative for dizziness, tingling, focal weakness, seizures, weakness and headaches.  Endo/Heme/Allergies:  Does not bruise/bleed easily.  Psychiatric/Behavioral:  Negative for depression and suicidal ideas. The patient does not have insomnia.    No Known Allergies  Past Medical History:  Diagnosis Date   Colon cancer ([place])    COPD (chronic obstructive pulmonary disease) ([place])    Hyperlipidemia     Past Surgical History:  Procedure Laterality Date   COLONOSCOPY WITH PROPOFOL N/A 06/26/2019   Procedure: COLONOSCOPY WITH PROPOFOL;  Surgeon: [name], MD;  Location[facility] ENDOSCOPY;  Service: Gastroenterology;  Laterality: N/A;   FLEXIBLE SIGMOIDOSCOPY N/A  05/07/2021   Procedure: FLEXIBLE  SIGMOIDOSCOPY;  Surgeon: [name], MD;  Location: [facility] ENDOSCOPY;  Service: Gastroenterology;  Laterality: N/A;   PORTA CATH INSERTION N/A 06/25/2019   Procedure: PORTA CATH INSERTION;  Surgeon: Algernon Huxley, MD;  Location: [place] CV LAB;  Service: Cardiovascular;  Laterality: N/A;   PORTA CATH INSERTION Left 08/11/2020   Procedure: PORTA CATH INSERTION;  Surgeon: Algernon Huxley, MD;  Location: [place] CV LAB;  Service: Cardiovascular;  Laterality: Left;   PORTA CATH REMOVAL Right 08/11/2020   Procedure: PORTA CATH REMOVAL;  Surgeon: Algernon Huxley, MD;  Location: [place] CV LAB;  Service: Cardiovascular;  Laterality: Right;    Social History   Socioeconomic History   Marital status: Single    Spouse name: Not on file   Number of children: Not on file   Years of education: Not on file   Highest education level: Not on file  Occupational History   Not on file  Tobacco Use   Smoking status: Every Day    Packs/day: 0.50    Years: 40.00    Pack years: 20.00    Types: Cigarettes   Smokeless tobacco: Never   Tobacco comments:    smoking 40 years  Vaping Use   Vaping Use: Never used  Substance and Sexual Activity   Alcohol use: No   Drug use: No   Sexual activity: Not Currently  Other Topics Concern   Not on file  Social History Narrative   Not on file   Social Determinants of Health   Financial Resource Strain: Not on file  Food Insecurity: Not on file  Transportation Needs: Not on file  Physical Activity: Not on file  Stress: Not on file  Social Connections: Not on file  Intimate Partner Violence: Not on file    Family History  Problem Relation Age of Onset   Brain cancer Father      Current Outpatient Medications:    allopurinol (ZYLOPRIM) 100 MG tablet, , Disp: , Rfl:    aspirin EC 81 MG tablet, Take 81 mg by mouth daily., Disp: , Rfl:    azelastine (ASTELIN) 0.1 % nasal spray, Place 1 spray into both nostrils 1 day or 1 dose., Disp: , Rfl:     butalbital-acetaminophen-caffeine (FIORICET) 50-325-40 MG tablet, Take 1 tablet by mouth every 4 (four) hours as needed., Disp: , Rfl:    capecitabine (XELODA) 500 MG tablet, Take 3 tablets (1,500 mg total) by mouth 2 (two) times daily after a meal. Take for 14 days, then hold for 7 days. Repeat every 21 days., Disp: 84 tablet, Rfl: 0   cetirizine (ZYRTEC) 10 MG tablet, Take 1 tablet (10 mg total) by mouth daily., Disp: 30 tablet, Rfl: 0   chlorthalidone (HYGROTON) 25 MG tablet, Take 1 tablet by mouth daily., Disp: , Rfl:    DULoxetine (CYMBALTA) 30 MG capsule, Take 1 capsule (30 mg total) by mouth 2 (two) times daily., Disp: 60 capsule, Rfl: 1   FAMOTIDINE PO, Take by mouth. Take as needed., Disp: , Rfl:    fluticasone (FLONASE) 50 MCG/ACT nasal spray, Place 2 sprays into both nostrils 1 day or 1 dose. , Disp: , Rfl:    fluticasone (VERAMYST) 27.5 MCG/SPRAY nasal spray, Place 2 sprays into the nose daily., Disp: , Rfl:    lactulose (CHRONULAC) 10 GM/15ML solution, Take 30 mLs (20 g total) by mouth 3 (three) times daily., Disp: 8100 mL, Rfl: 1   loperamide (IMODIUM A-D)  2 MG tablet, Take 1 tablet (2 mg total) by mouth 4 (four) times daily as needed. Take 2 at diarrhea onset , then 1 every 2hr until 12hrs with no BM. May take 2 every 4hrs at night. If diarrhea recurs repeat. (Patient not taking: Reported on 07/27/2021), Disp: 100 tablet, Rfl: 1   magic mouthwash w/lidocaine SOLN, Take 5-10 mLs by mouth 4 (four) times daily as needed for mouth pain. (Patient not taking: Reported on 07/27/2021), Disp: 480 mL, Rfl: 3   montelukast (SINGULAIR) 10 MG tablet, Take 10 mg by mouth at bedtime., Disp: , Rfl:    NEXLETOL 180 MG TABS, , Disp: , Rfl:    nicotine (NICODERM CQ - DOSED IN MG/24 HOURS) 21 mg/24hr patch, Place 1 patch onto the skin 1 day or 1 dose. (Patient not taking: Reported on 07/27/2021), Disp: , Rfl:    oxyCODONE (OXY IR/ROXICODONE) 5 MG immediate release tablet, Take 1 tablet (5 mg total) by mouth  every 6 (six) hours as needed for severe pain., Disp: 30 tablet, Rfl: 0   potassium chloride SA (K-DUR) 20 MEQ tablet, Take 1 tablet (20 mEq total) by mouth daily. (Patient not taking: Reported on 07/27/2021), Disp: 14 tablet, Rfl: 0   pregabalin (LYRICA) 75 MG capsule, Take 1 capsule (75 mg total) by mouth 2 (two) times daily., Disp: 60 capsule, Rfl: 2   simvastatin (ZOCOR) 20 MG tablet, Take 20 mg by mouth daily. , Disp: , Rfl:    SYMBICORT 80-4.5 MCG/ACT inhaler, Inhale 2 puffs into the lungs 1 day or 1 dose. , Disp: , Rfl:    tamsulosin (FLOMAX) 0.4 MG CAPS capsule, Take 0.4 mg by mouth. , Disp: , Rfl:   CT CHEST ABDOMEN PELVIS W CONTRAST  Result Date: 07/17/2021 CLINICAL DATA:  Colon cancer diagnosed in July of 2020. Treated with chemotherapy. Currently asymptomatic. EXAM: CT CHEST, ABDOMEN, AND PELVIS WITH CONTRAST TECHNIQUE: Multidetector CT imaging of the chest, abdomen and pelvis was performed following the standard protocol during bolus administration of intravenous contrast. CONTRAST:  113m OMNIPAQUE IOHEXOL 350 MG/ML SOLN COMPARISON:  04/01/2021 FINDINGS: CT CHEST FINDINGS Cardiovascular: Aortic atherosclerosis. Tortuous thoracic aorta. Normal heart size, without pericardial effusion. Lad coronary artery calcification. No central pulmonary embolism, on this non-dedicated study. Left Port-A-Cath tip at high right atrium. Mediastinum/Nodes: No mediastinal or hilar adenopathy. Lungs/Pleura: No pleural fluid.  Moderate centrilobular emphysema. Minimal motion degradation superiorly. Dependent subsegmental atelectasis in both lower lobes. No findings of pulmonary metastasis. Musculoskeletal: No acute osseous abnormality. CT ABDOMEN PELVIS FINDINGS Hepatobiliary: Caudate enlargement and subtle medial segment left liver lobe atrophy. No focal liver lesion. Normal gallbladder, without biliary ductal dilatation. Pancreas: Normal, without mass or ductal dilatation. Spleen: Mild splenomegaly including at  13.3 cm, chronic. Adrenals/Urinary Tract: Normal adrenal glands. Too small to characterize lesions in both kidneys. No hydronephrosis. Normal urinary bladder. Stomach/Bowel: The gastric antrum appears thick walled, but is underdistended including on 59/2. Scattered colonic diverticula. The previously described rectal wall thickening is no longer appreciated. No well-defined residual mass. Normal terminal ileum and appendix. Normal small bowel. Vascular/Lymphatic: Advanced aortic and branch vessel atherosclerosis. No specific evidence of portal venous hypertension. Patent portal and splenic veins. Pre caval node measures 6 mm on 85/2 (Previously 5 mm.) Aortocaval node measures 8 mm on 75/2 (Previously 5 mm.) More anterior aortocaval node measures 8 mm on 72/2 (Previously 5 mm.) Left external iliac index node measures 1.5 x 2.2 cm on 108/2 (Previously 1.3 x 1.5 cm.) More central external iliac node  measures 1.4 cm on 104/2 (Previously 9 mm.) Reproductive: Mild prostatomegaly. Other: Trace cul-de-sac fluid, new or increased. No evidence of omental or peritoneal disease. Periumbilical tiny fat and fluid containing hernia without bowel involvement. Musculoskeletal: Avascular necrosis of both femoral heads. Degenerate disc disease at the lumbosacral junction. IMPRESSION: 1. Mild progression of abdominopelvic adenopathy, highly suspicious for nodal metastasis. 2. Mild cirrhosis and persistent splenomegaly. New or increased trace cul-de-sac fluid. 3.  No acute process or evidence of metastatic disease in the chest. 4. Aortic atherosclerosis (ICD10-I70.0), coronary artery atherosclerosis and emphysema (ICD10-J43.9). 5. Apparent gastric antral wall thickening is most likely due to underdistention. Recommend clinical correlation for gastritis. 6. Chronic avascular necrosis of both femoral heads. Electronically Signed   By: Abigail Miyamoto M.D.   On: 07/17/2021 14:45    No images are attached to the encounter.   CMP Latest  Ref Rng & Units 08/03/2021  Glucose 70 - 99 mg/dL 96  BUN 8 - 23 mg/dL 21  Creatinine 0.61 - 1.24 mg/dL 0.95  Sodium 135 - 145 mmol/L 134(L)  Potassium 3.5 - 5.1 mmol/L 3.8  Chloride 98 - 111 mmol/L 104  CO2 22 - 32 mmol/L 23  Calcium 8.9 - 10.3 mg/dL 8.5(L)  Total Protein 6.5 - 8.1 g/dL 7.2  Total Bilirubin 0.3 - 1.2 mg/dL 0.5  Alkaline Phos 38 - 126 U/L 126  AST 15 - 41 U/L 38  ALT 0 - 44 U/L 20   CBC Latest Ref Rng & Units 08/03/2021  WBC 4.0 - 10.5 K/uL 5.8  Hemoglobin 13.0 - 17.0 g/dL 9.7(L)  Hematocrit 39.0 - 52.0 % 31.1(L)  Platelets 150 - 400 K/uL 130(L)     Observation/objective: Appears in no acute distress over video visit today.  Breathing is nonlabored  Assessment and plan: Patient is 70 year old male with metastatic K-ras wild-type colon cancer with intra-abdominal lymph node metastases here for on treatment assessment prior to cycle 1 of panitumumab and irinotecan  Counts okay to proceed with cycle 1 of panitumumab and irinotecan today.  He will also start taking Xeloda at 1500 mg twice daily 2 weeks on and 1 week off.  Again discussed risks and benefits of panitumumab and irinotecan including all but not limited to nausea, vomiting, low blood counts, risk of infections and hospitalizations.  Risk of diarrhea associated with irinotecan.  Risk of diarrhea and skin rash associated with panitumumab.  Patient understands and agrees to proceed as planned  I did explain to the patient that the reason we are switching treatment is because it has been a slow but consistent increase in the size of his intra-abdominal adenopathy despite being on 5-FU Avastin chemotherapy in the past.  Thrombocytopenia has improved after holding chemotherapy for 1 week and his platelets are better at 130 today  He has moderate anemia with a hemoglobin of 9.7 with labs suggestive of possible component of iron deficiency.  He will receive his second dose of Venofer today and remaining 3 doses once a  week over the next 3 weeks.  Follow-up instructions: I will see him back in 3 weeks for cycle 2 of panitumumab and irinotecan  I discussed the assessment and treatment plan with the patient. The patient was provided an opportunity to ask questions and all were answered. The patient agreed with the plan and demonstrated an understanding of the instructions.   The patient was advised to call back or seek an in-person evaluation if the symptoms worsen or if the condition fails to improve  as anticipated.  Visit Diagnosis: 1. High risk medication use   2. Encounter for antineoplastic chemotherapy   3. 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 ([place])   4. Iron deficiency anemia, unspecified iron deficiency anemia type   5. Chemotherapy-induced thrombocytopenia     Dr. Randa Evens, MD, MPH [facility] Tel- [phone] 08/03/2021 5:47 PM

## 2021-08-03 NOTE — Patient Instructions (Signed)
[facility]COL[facility]  Discharge Instructions: Thank you for choosing [facility] to provide your oncology and hematology care.  If you have a lab appointment with the [facility], please go directly to the [facility] and check in at the registration area.  Wear comfortable clothing and clothing appropriate for easy access to any Portacath or PICC line.   We strive to give you quality time with your provider. You may need to reschedule your appointment if you arrive late (15 or more minutes).  Arriving late affects you and other patients whose appointments are after yours.  Also, if you miss three or more appointments without notifying the office, you may be dismissed from the clinic at the provider's discretion.      For prescription refill requests, have your pharmacy contact our office and allow 72 hours for refills to be completed.    Today you received the following chemotherapy and/or immunotherapy agents Irinotecan, Panitumumab       To help prevent nausea and vomiting after your treatment, we encourage you to take your nausea medication as directed.  BELOW ARE SYMPTOMS THAT SHOULD BE REPORTED IMMEDIATELY: *FEVER GREATER THAN 100.4 F (38 C) OR HIGHER *CHILLS OR SWEATING *NAUSEA AND VOMITING THAT IS NOT CONTROLLED WITH YOUR NAUSEA MEDICATION *UNUSUAL SHORTNESS OF BREATH *UNUSUAL BRUISING OR BLEEDING *URINARY PROBLEMS (pain or burning when urinating, or frequent urination) *BOWEL PROBLEMS (unusual diarrhea, constipation, pain near the anus) TENDERNESS IN MOUTH AND THROAT WITH OR WITHOUT PRESENCE OF ULCERS (sore throat, sores in mouth, or a toothache) UNUSUAL RASH, SWELLING OR PAIN  UNUSUAL VAGINAL DISCHARGE OR ITCHING   Items with * indicate a potential emergency and should be followed up as soon as possible or go to the Emergency Department if any problems should occur.  Please show the CHEMOTHERAPY ALERT CARD or IMMUNOTHERAPY ALERT  CARD at check-in to the Emergency Department and triage nurse.  Should you have questions after your visit or need to cancel or reschedule your appointment, please contact [facility]  [phone] and follow the prompts.  Office hours are 8:00 a.m. to 4:30 p.m. Monday - Friday. Please note that voicemails left after 4:00 p.m. may not be returned until the following business day.  We are closed weekends and major holidays. You have access to a nurse at all times for urgent questions. Please call the main number to the clinic [phone] and follow the prompts.  For any non-urgent questions, you may also contact your provider using MyChart. We now offer e-Visits for anyone 29 and older to request care online for non-urgent symptoms. For details visit [email or web address].   Also download the MyChart app! Go to the app store, search "MyChart", open the app, select [facility], and log in with your MyChart username and password.  Due to Covid, a mask is required upon entering the hospital/clinic. If you do not have a mask, one will be given to you upon arrival. For doctor visits, patients may have 1 support person aged 57 or older with them. For treatment visits, patients cannot have anyone with them due to current Covid guidelines and our immunocompromised population.     Panitumumab Solution for Injection What is this medication? PANITUMUMAB (pan i TOOM ue mab) is a monoclonal antibody. It is used to treatcolorectal cancer. This medicine may be used for other purposes; ask your health care provider orpharmacist if you have questions. COMMON BRAND NAME(S): Vectibix What should I tell my  care team before I take this medication? They need to know if you have any of these conditions: eye disease, vision problems low levels of calcium, magnesium, or potassium in the blood lung or breathing disease, like asthma skin conditions or sensitivity an unusual or  allergic reaction to panitumumab, other medicines, foods, dyes, or preservatives pregnant or trying to get pregnant breast-feeding How should I use this medication? This drug is given as an infusion into a vein. It is administered in a hospitalor clinic by a specially trained health care professional. Talk to your pediatrician regarding the use of this medicine in children.Special care may be needed. Overdosage: If you think you have taken too much of this medicine contact apoison control center or emergency room at once. NOTE: This medicine is only for you. Do not share this medicine with others. What if I miss a dose? It is important not to miss your dose. Call your doctor or health careprofessional if you are unable to keep an appointment. What may interact with this medication? Do not take this medicine with any of the following medications: bevacizumab This list may not describe all possible interactions. Give your health care provider a list of all the medicines, herbs, non-prescription drugs, or dietary supplements you use. Also tell them if you smoke, drink alcohol, or use illegaldrugs. Some items may interact with your medicine. What should I watch for while using this medication? Visit your doctor for checks on your progress. This drug may make you feel generally unwell. This is not uncommon, as chemotherapy can affect healthy cells as well as cancer cells. Report any side effects. Continue your course oftreatment even though you feel ill unless your doctor tells you to stop. This medicine can make you more sensitive to the sun. Keep out of the sun while receiving this medicine and for 2 months after the last dose. If you cannot avoid being in the sun, wear protective clothing and use sunscreen. Do not usesun lamps or tanning beds/booths. In some cases, you may be given additional medicines to help with side effects.Follow all directions for their use. Call your doctor or health care  professional for advice if you get a fever, chills or sore throat, or other symptoms of a cold or flu. Do not treat yourself. This drug decreases your body's ability to fight infections. Try toavoid being around people who are sick. Avoid taking products that contain aspirin, acetaminophen, ibuprofen, naproxen, or ketoprofen unless instructed by your doctor. These medicines may hide afever. Do not become pregnant while taking this medicine and for 2 months after the last dose. Women should inform their doctor if they wish to become pregnant or think they might be pregnant. There is a potential for serious side effects to an unborn child. Talk to your health care professional or pharmacist for more information. Do not breast-feed an infant while taking this medicine or for 96month after the last dose. What side effects may I notice from receiving this medication? Side effects that you should report to your doctor or health care professionalas soon as possible: allergic reactions like skin rash, itching or hives, swelling of the face, lips, or tongue breathing problems changes in vision eye pain fast, irregular heartbeat fever, chills mouth sores red spots on the skin redness, blistering, peeling or loosening of the skin, including inside the mouth signs and symptoms of kidney injury like trouble passing urine or change in the amount of urine signs and symptoms of low blood pressure like  dizziness; feeling faint or lightheaded, falls; unusually weak or tired signs of low calcium like fast heartbeat, muscle cramps or muscle pain; pain, tingling, numbness in the hands or feet; seizures signs and symptoms of low magnesium like muscle cramps, pain, or weakness; tremors; seizures; or fast, irregular heartbeat signs and symptoms of low potassium like muscle cramps or muscle pain; chest pain; dizziness; feeling faint or lightheaded, falls; palpitations; breathing problems; or fast, irregular  heartbeat swelling of the ankles, feet, hands Side effects that usually do not require medical attention (report to yourdoctor or health care professional if they continue or are bothersome): changes in skin like acne, cracks, skin dryness diarrhea eyelash growth headache mouth sores nail changes nausea, vomiting This list may not describe all possible side effects. Call your doctor for medical advice about side effects. You may report side effects to FDA at1-800-FDA-1088. Where should I keep my medication? This drug is given in a hospital or clinic and will not be stored at home. NOTE: This sheet is a summary. It may not cover all possible information. If you have questions about this medicine, talk to your doctor, pharmacist, orhealth care provider.  2022 Elsevier/Gold Standard (2016-06-18 16:45:04)

## 2021-08-06 ENCOUNTER — Inpatient Hospital Stay: Payer: Medicare HMO

## 2021-08-06 VITALS — BP 100/53 | HR 72 | Temp 97.5°F | Resp 16

## 2021-08-06 DIAGNOSIS — R161 Splenomegaly, not elsewhere classified: Secondary | ICD-10-CM | POA: Diagnosis not present

## 2021-08-06 DIAGNOSIS — C187 Malignant neoplasm of sigmoid colon: Secondary | ICD-10-CM | POA: Diagnosis not present

## 2021-08-06 DIAGNOSIS — D6959 Other secondary thrombocytopenia: Secondary | ICD-10-CM | POA: Diagnosis not present

## 2021-08-06 DIAGNOSIS — D509 Iron deficiency anemia, unspecified: Secondary | ICD-10-CM | POA: Diagnosis not present

## 2021-08-06 DIAGNOSIS — D508 Other iron deficiency anemias: Secondary | ICD-10-CM

## 2021-08-06 DIAGNOSIS — Z452 Encounter for adjustment and management of vascular access device: Secondary | ICD-10-CM | POA: Diagnosis not present

## 2021-08-06 DIAGNOSIS — Z5112 Encounter for antineoplastic immunotherapy: Secondary | ICD-10-CM | POA: Diagnosis not present

## 2021-08-06 DIAGNOSIS — C772 Secondary and unspecified malignant neoplasm of intra-abdominal lymph nodes: Secondary | ICD-10-CM | POA: Diagnosis not present

## 2021-08-06 DIAGNOSIS — Z5111 Encounter for antineoplastic chemotherapy: Secondary | ICD-10-CM | POA: Diagnosis not present

## 2021-08-06 MED ORDER — HEPARIN SOD (PORK) LOCK FLUSH 100 UNIT/ML IV SOLN
500.0000 [IU] | Freq: Once | INTRAVENOUS | Status: AC | PRN
  Filled 2021-08-06: qty 5

## 2021-08-06 MED ORDER — SODIUM CHLORIDE 0.9% FLUSH
10.0000 mL | Freq: Once | INTRAVENOUS | Status: DC | PRN
  Filled 2021-08-06: qty 10

## 2021-08-06 MED ORDER — SODIUM CHLORIDE 0.9 % IV SOLN
Freq: Once | INTRAVENOUS | Status: AC
  Filled 2021-08-06: qty 250

## 2021-08-06 MED ORDER — SODIUM CHLORIDE 0.9 % IV SOLN: 200.0000 mg | INTRAVENOUS | Status: DC

## 2021-08-06 MED ORDER — IRON SUCROSE 20 MG/ML IV SOLN
200.0000 mg | Freq: Once | INTRAVENOUS | Status: AC
  Administered 2021-08-06: 200 mg via INTRAVENOUS
  Filled 2021-08-06: qty 10

## 2021-08-06 MED ORDER — HEPARIN SOD (PORK) LOCK FLUSH 100 UNIT/ML IV SOLN
INTRAVENOUS | Status: AC
  Administered 2021-08-06: 500 [IU]
  Filled 2021-08-06: qty 5

## 2021-08-06 NOTE — Patient Instructions (Signed)

## 2021-08-10 ENCOUNTER — Inpatient Hospital Stay: Payer: Medicare HMO | Admitting: Pharmacist

## 2021-08-10 ENCOUNTER — Ambulatory Visit: Payer: Medicare HMO

## 2021-08-10 ENCOUNTER — Ambulatory Visit: Payer: Medicare HMO | Admitting: Oncology

## 2021-08-10 ENCOUNTER — Inpatient Hospital Stay: Payer: Medicare HMO

## 2021-08-10 ENCOUNTER — Other Ambulatory Visit: Payer: Medicare HMO

## 2021-08-10 ENCOUNTER — Other Ambulatory Visit: Payer: Self-pay

## 2021-08-10 VITALS — BP 110/67 | HR 75 | Temp 97.0°F | Resp 19

## 2021-08-10 DIAGNOSIS — D508 Other iron deficiency anemias: Secondary | ICD-10-CM

## 2021-08-10 DIAGNOSIS — Z452 Encounter for adjustment and management of vascular access device: Secondary | ICD-10-CM | POA: Diagnosis not present

## 2021-08-10 DIAGNOSIS — R161 Splenomegaly, not elsewhere classified: Secondary | ICD-10-CM | POA: Diagnosis not present

## 2021-08-10 DIAGNOSIS — C772 Secondary and unspecified malignant neoplasm of intra-abdominal lymph nodes: Secondary | ICD-10-CM

## 2021-08-10 DIAGNOSIS — Z5112 Encounter for antineoplastic immunotherapy: Secondary | ICD-10-CM | POA: Diagnosis not present

## 2021-08-10 DIAGNOSIS — D6959 Other secondary thrombocytopenia: Secondary | ICD-10-CM | POA: Diagnosis not present

## 2021-08-10 DIAGNOSIS — C187 Malignant neoplasm of sigmoid colon: Secondary | ICD-10-CM | POA: Diagnosis not present

## 2021-08-10 DIAGNOSIS — C189 Malignant neoplasm of colon, unspecified: Secondary | ICD-10-CM

## 2021-08-10 DIAGNOSIS — Z5111 Encounter for antineoplastic chemotherapy: Secondary | ICD-10-CM | POA: Diagnosis not present

## 2021-08-10 DIAGNOSIS — D509 Iron deficiency anemia, unspecified: Secondary | ICD-10-CM | POA: Diagnosis not present

## 2021-08-10 LAB — CBC WITH DIFFERENTIAL/PLATELET
Abs Immature Granulocytes: 0.02 10*3/uL (ref 0.00–0.07)
Basophils Absolute: 0 10*3/uL (ref 0.0–0.1)
Basophils Relative: 1 %
Eosinophils Absolute: 0.1 10*3/uL (ref 0.0–0.5)
Eosinophils Relative: 2 %
HCT: 32.6 % — ABNORMAL LOW (ref 39.0–52.0)
Hemoglobin: 10.3 g/dL — ABNORMAL LOW (ref 13.0–17.0)
Immature Granulocytes: 0 %
Lymphocytes Relative: 26 %
Lymphs Abs: 1.5 10*3/uL (ref 0.7–4.0)
MCH: 28.4 pg (ref 26.0–34.0)
MCHC: 31.6 g/dL (ref 30.0–36.0)
MCV: 89.8 fL (ref 80.0–100.0)
Monocytes Absolute: 0.5 10*3/uL (ref 0.1–1.0)
Monocytes Relative: 8 %
Neutro Abs: 3.8 10*3/uL (ref 1.7–7.7)
Neutrophils Relative %: 63 %
Platelets: 100 10*3/uL — ABNORMAL LOW (ref 150–400)
RBC: 3.63 MIL/uL — ABNORMAL LOW (ref 4.22–5.81)
RDW: 20.2 % — ABNORMAL HIGH (ref 11.5–15.5)
WBC: 5.9 10*3/uL (ref 4.0–10.5)
nRBC: 0 % (ref 0.0–0.2)

## 2021-08-10 LAB — COMPREHENSIVE METABOLIC PANEL
ALT: 24 U/L (ref 0–44)
AST: 46 U/L — ABNORMAL HIGH (ref 15–41)
Albumin: 3.6 g/dL (ref 3.5–5.0)
Alkaline Phosphatase: 97 U/L (ref 38–126)
Anion gap: 7 (ref 5–15)
BUN: 28 mg/dL — ABNORMAL HIGH (ref 8–23)
CO2: 22 mmol/L (ref 22–32)
Calcium: 8.5 mg/dL — ABNORMAL LOW (ref 8.9–10.3)
Chloride: 107 mmol/L (ref 98–111)
Creatinine, Ser: 0.84 mg/dL (ref 0.61–1.24)
GFR, Estimated: >60 mL/min (ref >60)
Glucose, Bld: 87 mg/dL (ref 70–99)
Potassium: 4 mmol/L (ref 3.5–5.1)
Sodium: 136 mmol/L (ref 135–145)
Total Bilirubin: 0.8 mg/dL (ref 0.3–1.2)
Total Protein: 6.9 g/dL (ref 6.5–8.1)

## 2021-08-10 LAB — MAGNESIUM: Magnesium: 2.1 mg/dL (ref 1.7–2.4)

## 2021-08-10 MED ORDER — SODIUM CHLORIDE 0.9 % IV SOLN
Freq: Once | INTRAVENOUS | Status: AC
  Filled 2021-08-10: qty 250

## 2021-08-10 MED ORDER — HEPARIN SOD (PORK) LOCK FLUSH 100 UNIT/ML IV SOLN
500.0000 [IU] | Freq: Once | INTRAVENOUS | Status: AC
  Administered 2021-08-10: 500 [IU] via INTRAVENOUS
  Filled 2021-08-10: qty 5

## 2021-08-10 MED ORDER — IRON SUCROSE 20 MG/ML IV SOLN: 200.0000 mg | Freq: Once | INTRAVENOUS | Status: DC

## 2021-08-10 MED ORDER — SODIUM CHLORIDE 0.9 % IV SOLN: 200.0000 mg | INTRAVENOUS | Status: DC

## 2021-08-10 MED ORDER — IRON SUCROSE 20 MG/ML IV SOLN
200.0000 mg | Freq: Once | INTRAVENOUS | Status: AC
Start: 2021-08-10 — End: 2021-08-10
  Administered 2021-08-10: 200 mg via INTRAVENOUS
  Filled 2021-08-10: qty 10

## 2021-08-10 MED ORDER — SODIUM CHLORIDE 0.9% FLUSH
10.0000 mL | Freq: Once | INTRAVENOUS | Status: AC
Start: 2021-08-10 — End: 2021-08-10
  Administered 2021-08-10: 10 mL via INTRAVENOUS
  Filled 2021-08-10: qty 10

## 2021-08-10 NOTE — Progress Notes (Signed)
[place]  [phone] Fax:([phone]) [phone]  Patient Care Team: Jodi Marble, MD as PCP - General (Internal Medicine) Clent Jacks, RN as Oncology Nurse Navigator Sindy Guadeloupe, MD as Consulting Physician (Hematology and Oncology)   Name of the patient: Eric Simon  NQ:[id number]  1951/01/08   Date of visit: 08/10/21  HPI: Patient is a 70 y.o. male with progressive metastatic colon cancer, currently treated with Xeloda (capecitabine) in conjunction with irinotecan and panitumumab.  Reason for Consult: Oral chemotherapy follow-up for capecitabine therapy.   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  MEDICATIONS:  Current Outpatient Medications  Medication Sig Dispense Refill   allopurinol (ZYLOPRIM) 100 MG tablet      aspirin EC 81 MG tablet Take 81 mg by mouth daily.     azelastine (ASTELIN) 0.1 % nasal spray Place 1 spray into both nostrils 1 day or 1 dose.     butalbital-acetaminophen-caffeine (FIORICET) 50-325-40 MG tablet Take 1 tablet by mouth every 4 (four) hours as needed.     capecitabine (XELODA) 500 MG tablet Take 3 tablets (1,500 mg total) by mouth 2 (two) times daily after a meal. Take for 14 days,  then hold for 7 days. Repeat every 21 days. 84 tablet 0   cetirizine (ZYRTEC) 10 MG tablet Take 1 tablet (10 mg total) by mouth daily. 30 tablet 0   chlorthalidone (HYGROTON) 25 MG tablet Take 1 tablet by mouth daily.     DULoxetine (CYMBALTA) 30 MG capsule Take 1 capsule (30 mg total) by mouth 2 (two) times daily. 60 capsule 1   FAMOTIDINE PO Take by mouth. Take as needed.     fluticasone (FLONASE) 50 MCG/ACT nasal spray Place 2 sprays into both nostrils 1 day or 1 dose.      fluticasone (VERAMYST) 27.5 MCG/SPRAY nasal spray Place 2 sprays into the nose daily.     lactulose (CHRONULAC) 10 GM/15ML solution Take 30 mLs (20 g total) by mouth 3 (three) times daily. (Patient not taking: Reported on 08/03/2021) 8100 mL 1   loperamide (IMODIUM A-D) 2 MG tablet Take 1 tablet (2 mg total) by mouth 4 (four) times daily as needed. Take 2 at diarrhea onset , then 1 every 2hr until 12hrs with no BM. May take 2 every 4hrs at night. If diarrhea recurs repeat. (Patient not taking: No sig reported) 100 tablet 1   magic mouthwash w/lidocaine SOLN Take 5-10 mLs by mouth 4 (four) times daily as needed for mouth pain. (Patient not taking: No sig reported) 480 mL 3   montelukast (SINGULAIR) 10 MG tablet Take 10 mg by mouth at bedtime.     NEXLETOL 180 MG TABS Take 1 tablet  by mouth daily.     nicotine (NICODERM CQ - DOSED IN MG/24 HOURS) 21 mg/24hr patch Place 1 patch onto the skin 1 day or 1 dose. (Patient not taking: No sig reported)     oxyCODONE (OXY IR/ROXICODONE) 5 MG immediate release tablet Take 1 tablet (5 mg total) by mouth every 6 (six) hours as needed for severe pain. (Patient not taking: Reported on 08/03/2021) 30 tablet 0   potassium chloride SA (K-DUR) 20 MEQ tablet Take 1 tablet (20 mEq total) by mouth daily. (Patient not taking: No sig reported) 14 tablet 0   pregabalin (LYRICA) 75 MG capsule Take 1 capsule (75 mg total) by mouth 2 (two) times daily. 60 capsule 2   simvastatin (ZOCOR) 20 MG tablet Take 20  mg by mouth daily.      SYMBICORT 80-4.5 MCG/ACT inhaler Inhale 2 puffs into the lungs 1 day or 1 dose.      tamsulosin (FLOMAX) 0.4 MG CAPS capsule Take 0.4 mg by mouth.      No current facility-administered medications for this visit.    VITAL SIGNS: There were no vitals taken for this visit. There were no vitals filed for this visit.  Estimated body mass index is 21.22 kg/m as calculated from the following:   Height as of 08/03/21: '5\' 7"'$  (1.702 m).   Weight as of 08/03/21: 61.5 kg (135 lb 8 oz).  LABS: CBC:    Component Value Date/Time   WBC 5.9 08/10/2021 1350   HGB 10.3 (L) 08/10/2021 1350   HGB 16.1 08/18/2012 1321   HCT 32.6 (L) 08/10/2021 1350   HCT 45.4 08/18/2012 1321   PLT 100 (L) 08/10/2021 1350   PLT 192 08/18/2012 1321   MCV 89.8 08/10/2021 1350   MCV 88 08/18/2012 1321   NEUTROABS 3.8 08/10/2021 1350   LYMPHSABS 1.5 08/10/2021 1350   MONOABS 0.5 08/10/2021 1350   EOSABS 0.1 08/10/2021 1350   BASOSABS 0.0 08/10/2021 1350   Comprehensive Metabolic Panel:    Component Value Date/Time   NA 136 08/10/2021 1350   NA 139 08/18/2012 1321   K 4.0 08/10/2021 1350   K 3.7 08/18/2012 1321   CL 107 08/10/2021 1350   CL 104 08/18/2012 1321   CO2 22 08/10/2021 1350   CO2 27 08/18/2012 1321   BUN 28 (H) 08/10/2021 1350   BUN 18 08/18/2012 1321   CREATININE 0.84 08/10/2021 1350   CREATININE 0.82 08/18/2012 1321   GLUCOSE 87 08/10/2021 1350   GLUCOSE 68 08/18/2012 1321   CALCIUM 8.5 (L) 08/10/2021 1350   CALCIUM 9.0 08/18/2012 1321   AST 46 (H) 08/10/2021 1350   AST 36 08/18/2012 1321   ALT 24 08/10/2021 1350   ALT 51 08/18/2012 1321   ALKPHOS 97 08/10/2021 1350   ALKPHOS 88 08/18/2012 1321   BILITOT 0.8 08/10/2021 1350   BILITOT 0.4 08/18/2012 1321   PROT 6.9 08/10/2021 1350   PROT 8.0 08/18/2012 1321   ALBUMIN 3.6 08/10/2021 1350   ALBUMIN 4.1 08/18/2012 1321     Present during today's visit: patient only, patient left cancer center prior to appt  called patient for f/u once he was home  Assessment and Plan: Continue capecitabine   Oral Chemotherapy Side Effect/Intolerance:  Fatigue: patient has noticed an increase in fatigue with the capecitabine. He notices it mostly after his morning dose. The increase in fatigue is tolerable. No reported hand-foot syndrome, diarrhea, or mouth sores  Oral Chemotherapy Adherence: no missed doses reported No patient  barriers to medication adherence identified.   New medications: no reported  Medication Access Issues: no issues, [facility] will be contacting pt next week for deliver of capecitabine for next cycle  Patient expressed understanding and was in agreement with this plan. He also understands that He can call clinic at any time with any questions, concerns, or complaints.    Thank you for allowing me to participate in the care of this very pleasant patient.   Time Total: 10   Visit consisted of counseling and education on dealing with issues of symptom management in the setting of serious and potentially life-threatening illness.Greater than 50%  of this time was spent counseling and coordinating care related to the above assessment and plan.  Signed by: Darl Pikes, PharmD, BCPS, Salley Slaughter, CPP Hematology/Oncology Clinical Pharmacist Practitioner [facility] [phone]  08/10/2021 4:43 PM

## 2021-08-10 NOTE — Patient Instructions (Signed)
[facility]  Discharge Instructions: Thank you for choosing [facility] to provide your oncology and hematology care.  If you have a lab appointment with the Cancer Center, please go directly to the Cancer Center and check in at the registration area.  Wear comfortable clothing and clothing appropriate for easy access to any Portacath or PICC line.   We strive to give you quality time with your provider. You may need to reschedule your appointment if you arrive late (15 or more minutes).  Arriving late affects you and other patients whose appointments are after yours.  Also, if you miss three or more appointments without notifying the office, you may be dismissed from the clinic at the provider's discretion.      For prescription refill requests, have your pharmacy contact our office and allow 72 hours for refills to be completed.    Today you received the following chemotherapy and/or immunotherapy agents venofer   To help prevent nausea and vomiting after your treatment, we encourage you to take your nausea medication as directed.  BELOW ARE SYMPTOMS THAT SHOULD BE REPORTED IMMEDIATELY: *FEVER GREATER THAN 100.4 F (38 C) OR HIGHER *CHILLS OR SWEATING *NAUSEA AND VOMITING THAT IS NOT CONTROLLED WITH YOUR NAUSEA MEDICATION *UNUSUAL SHORTNESS OF BREATH *UNUSUAL BRUISING OR BLEEDING *URINARY PROBLEMS (pain or burning when urinating, or frequent urination) *BOWEL PROBLEMS (unusual diarrhea, constipation, pain near the anus) TENDERNESS IN MOUTH AND THROAT WITH OR WITHOUT PRESENCE OF ULCERS (sore throat, sores in mouth, or a toothache) UNUSUAL RASH, SWELLING OR PAIN  UNUSUAL VAGINAL DISCHARGE OR ITCHING   Items with * indicate a potential emergency and should be followed up as soon as possible or go to the Emergency Department if any problems should occur.  Please show the CHEMOTHERAPY ALERT CARD or IMMUNOTHERAPY ALERT CARD at check-in to the  Emergency Department and triage nurse.  Should you have questions after your visit or need to cancel or reschedule your appointment, please contact [facility]CER CENTER  [facility]  [phone] and follow the prompts.  Office hours are 8:00 a.m. to 4:30 p.m. Monday - Friday. Please note that voicemails left after 4:00 p.m. may not be returned until the following business day.  We are closed weekends and major holidays. You have access to a nurse at all times for urgent questions. Please call the main number to the clinic [phone] and follow the prompts.  For any non-urgent questions, you may also contact your provider using MyChart. We now offer e-Visits for anyone 18 and older to request care online for non-urgent symptoms. For details visit [email or web address].   Also download the MyChart app! Go to the app store, search "MyChart", open the app, select , and log in with your MyChart username and password.  Due to Covid, a mask is required upon entering the hospital/clinic. If you do not have a mask, one will be given to you upon arrival. For doctor visits, patients may have 1 support person aged 18 or older with them. For treatment visits, patients cannot have anyone with them due to current Covid guidelines and our immunocompromised population.  

## 2021-08-13 ENCOUNTER — Other Ambulatory Visit: Payer: Self-pay | Admitting: *Deleted

## 2021-08-13 MED ORDER — PREGABALIN 75 MG PO CAPS: 75.0000 mg | ORAL_CAPSULE | Freq: Two times a day (BID) | ORAL | 2 refills | Status: DC

## 2021-08-20 ENCOUNTER — Other Ambulatory Visit (HOSPITAL_COMMUNITY): Payer: Self-pay

## 2021-08-21 ENCOUNTER — Telehealth: Payer: Self-pay | Admitting: Pharmacist

## 2021-08-21 ENCOUNTER — Other Ambulatory Visit (HOSPITAL_COMMUNITY): Payer: Self-pay

## 2021-08-21 MED ORDER — STERILE WATER FOR INJECTION IJ SOLN
OROMUCOSAL | 3 refills | Status: DC
  Filled 2021-08-21: qty 480, 12d supply, fill #0

## 2021-08-21 NOTE — Telephone Encounter (Signed)
Oral Chemotherapy Pharmacist Encounter   Mr. Kitzmann called and reported having mouth sores. Prescription for magic mouth was faxed to [facility] for patient.   Darl Pikes, PharmD, BCPS, BCOP, CPP Hematology/Oncology Clinical Pharmacist [facility] [phone]  08/21/2021 2:10 PM

## 2021-08-24 ENCOUNTER — Inpatient Hospital Stay: Payer: Medicare HMO | Attending: Oncology

## 2021-08-24 ENCOUNTER — Other Ambulatory Visit (HOSPITAL_COMMUNITY): Payer: Self-pay

## 2021-08-24 ENCOUNTER — Inpatient Hospital Stay: Payer: Medicare HMO | Admitting: Pharmacist

## 2021-08-24 ENCOUNTER — Encounter: Payer: Self-pay | Admitting: Oncology

## 2021-08-24 ENCOUNTER — Inpatient Hospital Stay (HOSPITAL_BASED_OUTPATIENT_CLINIC_OR_DEPARTMENT_OTHER): Payer: Medicare HMO | Admitting: Oncology

## 2021-08-24 ENCOUNTER — Inpatient Hospital Stay: Payer: Medicare HMO

## 2021-08-24 VITALS — BP 107/57 | HR 73 | Temp 97.5°F | Resp 16 | Ht 67.0 in | Wt 137.7 lb

## 2021-08-24 DIAGNOSIS — D6959 Other secondary thrombocytopenia: Secondary | ICD-10-CM | POA: Diagnosis not present

## 2021-08-24 DIAGNOSIS — C189 Malignant neoplasm of colon, unspecified: Secondary | ICD-10-CM

## 2021-08-24 DIAGNOSIS — T451X5A Adverse effect of antineoplastic and immunosuppressive drugs, initial encounter: Secondary | ICD-10-CM

## 2021-08-24 DIAGNOSIS — R634 Abnormal weight loss: Secondary | ICD-10-CM | POA: Insufficient documentation

## 2021-08-24 DIAGNOSIS — Z5112 Encounter for antineoplastic immunotherapy: Secondary | ICD-10-CM | POA: Insufficient documentation

## 2021-08-24 DIAGNOSIS — C772 Secondary and unspecified malignant neoplasm of intra-abdominal lymph nodes: Secondary | ICD-10-CM | POA: Diagnosis not present

## 2021-08-24 DIAGNOSIS — C187 Malignant neoplasm of sigmoid colon: Secondary | ICD-10-CM | POA: Insufficient documentation

## 2021-08-24 DIAGNOSIS — Z5111 Encounter for antineoplastic chemotherapy: Secondary | ICD-10-CM | POA: Insufficient documentation

## 2021-08-24 DIAGNOSIS — D508 Other iron deficiency anemias: Secondary | ICD-10-CM

## 2021-08-24 DIAGNOSIS — C778 Secondary and unspecified malignant neoplasm of lymph nodes of multiple regions: Secondary | ICD-10-CM | POA: Insufficient documentation

## 2021-08-24 DIAGNOSIS — E785 Hyperlipidemia, unspecified: Secondary | ICD-10-CM | POA: Diagnosis not present

## 2021-08-24 LAB — COMPREHENSIVE METABOLIC PANEL
ALT: 24 U/L (ref 0–44)
AST: 42 U/L — ABNORMAL HIGH (ref 15–41)
Albumin: 3.6 g/dL (ref 3.5–5.0)
Alkaline Phosphatase: 106 U/L (ref 38–126)
Anion gap: 8 (ref 5–15)
BUN: 22 mg/dL (ref 8–23)
CO2: 23 mmol/L (ref 22–32)
Calcium: 8.8 mg/dL — ABNORMAL LOW (ref 8.9–10.3)
Chloride: 106 mmol/L (ref 98–111)
Creatinine, Ser: 0.86 mg/dL (ref 0.61–1.24)
GFR, Estimated: >60 mL/min (ref >60)
Glucose, Bld: 132 mg/dL — ABNORMAL HIGH (ref 70–99)
Potassium: 3.2 mmol/L — ABNORMAL LOW (ref 3.5–5.1)
Sodium: 137 mmol/L (ref 135–145)
Total Bilirubin: 0.6 mg/dL (ref 0.3–1.2)
Total Protein: 6.8 g/dL (ref 6.5–8.1)

## 2021-08-24 LAB — CBC WITH DIFFERENTIAL/PLATELET
Abs Immature Granulocytes: 0.02 10*3/uL (ref 0.00–0.07)
Basophils Absolute: 0 10*3/uL (ref 0.0–0.1)
Basophils Relative: 1 %
Eosinophils Absolute: 0.2 10*3/uL (ref 0.0–0.5)
Eosinophils Relative: 4 %
HCT: 34.6 % — ABNORMAL LOW (ref 39.0–52.0)
Hemoglobin: 11.3 g/dL — ABNORMAL LOW (ref 13.0–17.0)
Immature Granulocytes: 0 %
Lymphocytes Relative: 30 %
Lymphs Abs: 1.4 10*3/uL (ref 0.7–4.0)
MCH: 30.8 pg (ref 26.0–34.0)
MCHC: 32.7 g/dL (ref 30.0–36.0)
MCV: 94.3 fL (ref 80.0–100.0)
Monocytes Absolute: 0.4 10*3/uL (ref 0.1–1.0)
Monocytes Relative: 8 %
Neutro Abs: 2.6 10*3/uL (ref 1.7–7.7)
Neutrophils Relative %: 57 %
Platelets: 84 10*3/uL — ABNORMAL LOW (ref 150–400)
RBC: 3.67 MIL/uL — ABNORMAL LOW (ref 4.22–5.81)
RDW: 25.4 % — ABNORMAL HIGH (ref 11.5–15.5)
WBC: 4.6 10*3/uL (ref 4.0–10.5)
nRBC: 0 % (ref 0.0–0.2)

## 2021-08-24 MED ORDER — ATROPINE SULFATE 1 MG/ML IJ SOLN
INTRAMUSCULAR | Status: AC
  Administered 2021-08-24: 0.5 mg via INTRAVENOUS
  Filled 2021-08-24: qty 1

## 2021-08-24 MED ORDER — SODIUM CHLORIDE 0.9 % IV SOLN
Freq: Once | INTRAVENOUS | Status: AC
  Filled 2021-08-24: qty 250

## 2021-08-24 MED ORDER — SODIUM CHLORIDE 0.9 % IV SOLN
10.0000 mg | Freq: Once | INTRAVENOUS | Status: AC
  Administered 2021-08-24: 10 mg via INTRAVENOUS
  Filled 2021-08-24: qty 10

## 2021-08-24 MED ORDER — IRON SUCROSE 20 MG/ML IV SOLN
200.0000 mg | Freq: Once | INTRAVENOUS | Status: AC
  Administered 2021-08-24: 200 mg via INTRAVENOUS
  Filled 2021-08-24: qty 10

## 2021-08-24 MED ORDER — CAPECITABINE 500 MG PO TABS
1000.0000 mg | ORAL_TABLET | Freq: Two times a day (BID) | ORAL | 0 refills | Status: DC
  Filled 2021-08-24: qty 56, 14d supply, fill #0
  Filled 2021-08-24: qty 56, 21d supply, fill #0

## 2021-08-24 MED ORDER — SODIUM CHLORIDE 0.9 % IV SOLN
125.0000 mg/m2 | Freq: Once | INTRAVENOUS | Status: AC
  Administered 2021-08-24: 220 mg via INTRAVENOUS
  Filled 2021-08-24: qty 10

## 2021-08-24 MED ORDER — SODIUM CHLORIDE 0.9% FLUSH
10.0000 mL | INTRAVENOUS | Status: DC | PRN
  Administered 2021-08-24: 10 mL
  Filled 2021-08-24: qty 10

## 2021-08-24 MED ORDER — ATROPINE SULFATE 1 MG/ML IJ SOLN
0.5000 mg | Freq: Once | INTRAMUSCULAR | Status: AC | PRN
  Filled 2021-08-24: qty 1

## 2021-08-24 MED ORDER — HEPARIN SOD (PORK) LOCK FLUSH 100 UNIT/ML IV SOLN
INTRAVENOUS | Status: AC
  Filled 2021-08-24: qty 5

## 2021-08-24 MED ORDER — SODIUM CHLORIDE 0.9 % IV SOLN
200.0000 mg | INTRAVENOUS | Status: DC
  Filled 2021-08-24: qty 10

## 2021-08-24 MED ORDER — PALONOSETRON HCL INJECTION 0.25 MG/5ML
0.2500 mg | Freq: Once | INTRAVENOUS | Status: AC
  Administered 2021-08-24: 0.25 mg via INTRAVENOUS
  Filled 2021-08-24: qty 5

## 2021-08-24 MED ORDER — SODIUM CHLORIDE 0.9 % IV SOLN
6.0000 mg/kg | Freq: Once | INTRAVENOUS | Status: AC
  Administered 2021-08-24: 400 mg via INTRAVENOUS
  Filled 2021-08-24: qty 20

## 2021-08-24 MED ORDER — HEPARIN SOD (PORK) LOCK FLUSH 100 UNIT/ML IV SOLN
500.0000 [IU] | Freq: Once | INTRAVENOUS | Status: AC | PRN
  Administered 2021-08-24: 500 [IU]
  Filled 2021-08-24: qty 5

## 2021-08-24 MED ORDER — SODIUM CHLORIDE 0.9% FLUSH
10.0000 mL | Freq: Once | INTRAVENOUS | Status: AC
  Administered 2021-08-24: 10 mL via INTRAVENOUS
  Filled 2021-08-24: qty 10

## 2021-08-24 NOTE — Progress Notes (Signed)
Per MD ok to treat with Plts

## 2021-08-24 NOTE — Progress Notes (Signed)
Pt did not get his shipment of xeloda. No other changes from his standpoint

## 2021-08-24 NOTE — Progress Notes (Signed)
PLT 84. Per Dr. [name], okay to proceed with treatment.

## 2021-08-24 NOTE — Patient Instructions (Signed)
[facility]  Discharge Instructions: Thank you for choosing [facility] to provide your oncology and hematology care.  If you have a lab appointment with the [facility], please go directly to the [facility] and check in at the registration area.  Wear comfortable clothing and clothing appropriate for easy access to any Portacath or PICC line.   We strive to give you quality time with your provider. You may need to reschedule your appointment if you arrive late (15 or more minutes).  Arriving late affects you and other patients whose appointments are after yours.  Also, if you miss three or more appointments without notifying the office, you may be dismissed from the clinic at the provider's discretion.      For prescription refill requests, have your pharmacy contact our office and allow 72 hours for refills to be completed.    Today you received the following chemotherapy and/or immunotherapy agents Panitumumab & Irinotecan      To help prevent nausea and vomiting after your treatment, we encourage you to take your nausea medication as directed.  BELOW ARE SYMPTOMS THAT SHOULD BE REPORTED IMMEDIATELY: *FEVER GREATER THAN 100.4 F (38 C) OR HIGHER *CHILLS OR SWEATING *NAUSEA AND VOMITING THAT IS NOT CONTROLLED WITH YOUR NAUSEA MEDICATION *UNUSUAL SHORTNESS OF BREATH *UNUSUAL BRUISING OR BLEEDING *URINARY PROBLEMS (pain or burning when urinating, or frequent urination) *BOWEL PROBLEMS (unusual diarrhea, constipation, pain near the anus) TENDERNESS IN MOUTH AND THROAT WITH OR WITHOUT PRESENCE OF ULCERS (sore throat, sores in mouth, or a toothache) UNUSUAL RASH, SWELLING OR PAIN  UNUSUAL VAGINAL DISCHARGE OR ITCHING   Items with * indicate a potential emergency and should be followed up as soon as possible or go to the Emergency Department if any problems should occur.  Please show the CHEMOTHERAPY ALERT CARD or IMMUNOTHERAPY ALERT  CARD at check-in to the Emergency Department and triage nurse.  Should you have questions after your visit or need to cancel or reschedule your appointment, please contact [facility]  [phone] and follow the prompts.  Office hours are 8:00 a.m. to 4:30 p.m. Monday - Friday. Please note that voicemails left after 4:00 p.m. may not be returned until the following business day.  We are closed weekends and major holidays. You have access to a nurse at all times for urgent questions. Please call the main number to the clinic [phone] and follow the prompts.  For any non-urgent questions, you may also contact your provider using MyChart. We now offer e-Visits for anyone 55 and older to request care online for non-urgent symptoms. For details visit [email or web address].   Also download the MyChart app! Go to the app store, search "MyChart", open the app, select [facility], and log in with your MyChart username and password.  Due to Covid, a mask is required upon entering the hospital/clinic. If you do not have a mask, one will be given to you upon arrival. For doctor visits, patients may have 1 support person aged 44 or older with them. For treatment visits, patients cannot have anyone with them due to current Covid guidelines and our immunocompromised population.

## 2021-08-24 NOTE — Progress Notes (Signed)
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 He currently smokes 0.5ppd. he presented to the ER with symptoms of left sided chest pain and left arm pain. Troponin was negative ekg was unremarkable. Ct chest showed no PE. He was incidentally noted to have mediastinal and retrocrural adenopathy and a 2.1X2.3X4.4 cm retroaortic soft tissue lesion in the posterior left chest. 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  Low-grade activity in the left lateral fifth and sixth ribs associated with nondisplaced fractures likely reflecting healing response problem malignancy.   Patient underwent CT-guided biopsy of the retroperitoneal lymph node pathology showed metastatic adenocarcinoma compatible with colorectal origin.  CK7 negative.  CK20 positive.  CDX 2+.  TTF-1 negative.  PSA negative.  This pattern of immunoreactivity supports the above diagnosis. Patient underwent colonoscopy which showed sigmoid mass that was consistent with adenocarcinoma.  RAS panel testing showed that he was wild-type for both K-ras and BRAF   FOLFOX and bevacizumab chemotherapy started in July 2020.  Subsequently oxaliplatin has been on hold given neuropathy.  Recent scans  have shown slow increase in the size of intra-abdominal adenopathy but patient continues to have low-volume disease.plan is to switch him to second line Xeloda irinotecan cetuximab regimen    Interval history-patient is concerned ongoing weight loss.  Weight is down to 137 pounds today As compared to 145 pounds about 5 months ago.  Appetite is good.  He denies any abdominal pain.  Denies any rectal bleeding  ECOG PS- 1 Pain scale- 0  Review of systems- Review of Systems  Constitutional:  Positive for malaise/fatigue and weight loss. Negative for chills and fever.  HENT:  Negative for congestion, ear discharge and nosebleeds.   Eyes:  Negative for blurred vision.  Respiratory:  Negative for cough, hemoptysis, sputum production, shortness of breath and wheezing.   Cardiovascular:  Negative for chest pain, palpitations, orthopnea and claudication.  Gastrointestinal:  Negative for abdominal pain, blood in stool, constipation, diarrhea, heartburn, melena, nausea and vomiting.  Genitourinary:  Negative for dysuria, flank pain, frequency, hematuria and urgency.  Musculoskeletal:  Negative for back pain, joint pain and myalgias.  Skin:  Negative for rash.  Neurological:  Negative for dizziness, tingling, focal weakness, seizures, weakness and headaches.  Endo/Heme/Allergies:  Does not bruise/bleed easily.  Psychiatric/Behavioral:  Negative for depression and suicidal ideas. The patient does not have insomnia.      No Known Allergies   Past Medical History:  Diagnosis Date   Colon cancer ([place])    COPD (chronic obstructive pulmonary disease) ([place])    Hyperlipidemia      Past Surgical History:  Procedure Laterality Date   COLONOSCOPY WITH PROPOFOL N/A 06/26/2019   Procedure: COLONOSCOPY WITH PROPOFOL;  Surgeon: [name], MD;  Location: [facility] ENDOSCOPY;  Service: Gastroenterology;  Laterality: N/A;   FLEXIBLE  SIGMOIDOSCOPY N/A 05/07/2021   Procedure: FLEXIBLE SIGMOIDOSCOPY;  Surgeon: [name], MD;  Location: [facility] ENDOSCOPY;  Service: Gastroenterology;  Laterality: N/A;   PORTA CATH INSERTION N/A 06/25/2019   Procedure: PORTA CATH INSERTION;  Surgeon: Algernon Huxley, MD;  Location: [place] CV LAB;  Service: Cardiovascular;  Laterality: N/A;   PORTA CATH INSERTION Left 08/11/2020   Procedure: PORTA CATH INSERTION;  Surgeon: Algernon Huxley, MD;  Location: [place] CV LAB;  Service: Cardiovascular;  Laterality: Left;   PORTA CATH REMOVAL Right 08/11/2020   Procedure: PORTA CATH REMOVAL;  Surgeon: Algernon Huxley, MD;  Location: [place] CV LAB;  Service: Cardiovascular;  Laterality: Right;    Social History   Socioeconomic History   Marital status: Single    Spouse name: Not on file   Number of children: Not on file   Years of education: Not on file   Highest education level: Not on file  Occupational History   Not on file  Tobacco Use   Smoking status: Every Day    Packs/day: 0.50    Years: 40.00    Pack years: 20.00    Types: Cigarettes   Smokeless tobacco: Never   Tobacco comments:    smoking 40 years  Vaping Use   Vaping Use: Never used  Substance and Sexual Activity   Alcohol use: No   Drug use: No   Sexual activity: Not Currently  Other Topics Concern   Not on file  Social History Narrative   Not on file   Social Determinants of Health   Financial Resource Strain: Not on file  Food Insecurity: Not on file  Transportation Needs: Not on file  Physical Activity: Not on file  Stress: Not on file  Social Connections: Not on file  Intimate Partner Violence: Not on file    Family History  Problem Relation Age of Onset   Brain cancer Father      Current Outpatient Medications:    allopurinol (ZYLOPRIM) 100 MG tablet, , Disp: , Rfl:    aspirin EC 81 MG tablet, Take 81 mg by mouth daily., Disp: , Rfl:    azelastine (ASTELIN) 0.1 % nasal spray, Place 1 spray into both nostrils 1 day or 1 dose., Disp: , Rfl:     butalbital-acetaminophen-caffeine (FIORICET) 50-325-40 MG tablet, Take 1 tablet by mouth every 4 (four) hours as needed., Disp: , Rfl:    cetirizine (ZYRTEC) 10 MG tablet, Take 1 tablet (10 mg total) by mouth daily., Disp: 30 tablet, Rfl: 0   chlorthalidone (HYGROTON) 25 MG tablet, Take 1 tablet by mouth daily., Disp: , Rfl:    DULoxetine (CYMBALTA) 30 MG capsule, Take 1 capsule (30 mg total) by mouth 2 (two) times daily., Disp: 60 capsule, Rfl: 1   FAMOTIDINE PO, Take by mouth. Take as needed., Disp: , Rfl:    fluticasone (FLONASE) 50 MCG/ACT nasal spray, Place 2 sprays into both nostrils 1 day or 1 dose. , Disp: , Rfl:    fluticasone (VERAMYST) 27.5 MCG/SPRAY nasal spray, Place 2 sprays into the nose daily., Disp: , Rfl:    loperamide (IMODIUM A-D) 2 MG tablet, Take 1 tablet (2 mg total) by mouth 4 (four) times daily as needed. Take 2 at diarrhea onset , then 1 every 2hr until 12hrs with no BM. May take 2 every 4hrs at night. If diarrhea recurs repeat., Disp: 100 tablet, Rfl: 1   magic mouthwash (multi-ingredient) oral suspension, Swish and spit 5-10 mls by mouth  4 times a day as needed, Disp: 480 mL, Rfl: 3   magic mouthwash w/lidocaine SOLN, Take 5-10 mLs by mouth 4 (four) times daily as needed for mouth pain., Disp: 480 mL, Rfl: 3   montelukast (SINGULAIR) 10 MG tablet, Take 10 mg by mouth at bedtime., Disp: , Rfl:    NEXLETOL 180 MG TABS, Take 1 tablet by mouth daily., Disp: , Rfl:    pregabalin (LYRICA) 75 MG capsule, Take 1 capsule (75 mg total) by mouth 2 (two) times daily., Disp: 60 capsule, Rfl: 2   simvastatin (ZOCOR) 20 MG tablet, Take 20 mg by mouth daily. , Disp: , Rfl:    SYMBICORT 80-4.5 MCG/ACT inhaler, Inhale 2 puffs into the lungs 1 day or 1 dose. , Disp: , Rfl:    tamsulosin (FLOMAX) 0.4 MG CAPS capsule, Take 0.4 mg by mouth. , Disp: , Rfl:    capecitabine (XELODA) 500 MG tablet, Take 3 tablets (1,500 mg total) by mouth 2 (two) times daily after a meal. Take for 14 days, then  hold for 7 days. Repeat every 21 days. (Patient not taking: Reported on 08/24/2021), Disp: 84 tablet, Rfl: 0   nicotine (NICODERM CQ - DOSED IN MG/24 HOURS) 21 mg/24hr patch, Place 1 patch onto the skin 1 day or 1 dose. (Patient not taking: No sig reported), Disp: , Rfl:    oxyCODONE (OXY IR/ROXICODONE) 5 MG immediate release tablet, Take 1 tablet (5 mg total) by mouth every 6 (six) hours as needed for severe pain. (Patient not taking: Reported on 08/24/2021), Disp: 30 tablet, Rfl: 0   potassium chloride SA (K-DUR) 20 MEQ tablet, Take 1 tablet (20 mEq total) by mouth daily. (Patient not taking: No sig reported), Disp: 14 tablet, Rfl: 0  Physical exam:  Vitals:   08/24/21 0841  BP: (!) 107/57  Pulse: 73  Resp: 16  Temp: (!) 97.5 F (36.4 C)  TempSrc: Tympanic  Weight: 137 lb 11.2 oz (62.5 kg)  Height: _0  (1.702 m)   Physical Exam Constitutional:      General: He is not in acute distress. Cardiovascular:     Rate and Rhythm: Normal rate and regular rhythm.     Heart sounds: Normal heart sounds.  Pulmonary:     Effort: Pulmonary effort is normal.     Breath sounds: Normal breath sounds.  Abdominal:     General: Bowel sounds are normal.     Palpations: Abdomen is soft.  Skin:    General: Skin is warm and dry.  Neurological:     Mental Status: He is alert and oriented to person, place, and time.     CMP Latest Ref Rng & Units 08/10/2021  Glucose 70 - 99 mg/dL 87  BUN 8 - 23 mg/dL 28(H)  Creatinine 0.61 - 1.24 mg/dL 0.84  Sodium 135 - 145 mmol/L 136  Potassium 3.5 - 5.1 mmol/L 4.0  Chloride 98 - 111 mmol/L 107  CO2 22 - 32 mmol/L 22  Calcium 8.9 - 10.3 mg/dL 8.5(L)  Total Protein 6.5 - 8.1 g/dL 6.9  Total Bilirubin 0.3 - 1.2 mg/dL 0.8  Alkaline Phos 38 - 126 U/L 97  AST 15 - 41 U/L 46(H)  ALT 0 - 44 U/L 24   CBC Latest Ref Rng & Units 08/24/2021  WBC 4.0 - 10.5 K/uL 4.6  Hemoglobin 13.0 - 17.0 g/dL 11.3(L)  Hematocrit 39.0 - 52.0 % 34.6(L)  Platelets 150 - 400 K/uL  84(L)     Assessment and plan-  Patient is a 71 y.o. male with metastatic K-ras wild-type colon cancer with intra-abdominal lymph node metastases.  She is here for on treatment assessment prior to cycle 2 of panitumumab and irinotecan  Patient has ongoing thrombocytopenia likely secondary to chemotherapy and his platelet count is down to 84 today.  Patient has not received his Xeloda yet and I will plan to give him a lower dose of 1000 mg twice daily.  Also dose reduce irinotecan to 125 mg per metered square along with panitumumab.  I will see him back in 3 weeks with CBC with differential CMP and CEA for panitumumab and irinotecan.  Unintentional weight loss: Possibly secondary to progressive malignancy.  Continue to monitor  Visit Diagnosis 1. Encounter for antineoplastic chemotherapy   2. Chemotherapy-induced thrombocytopenia   3. 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 ([place])      Dr. Randa Evens, MD, MPH [facility] at [facility] [phone] 08/24/2021 8:43 AM

## 2021-08-24 NOTE — Progress Notes (Signed)
[place]  [phone] [phone]  Patient Care Team: Jodi Marble, MD as PCP - General (Internal Medicine) Clent Jacks, RN as Oncology Nurse Navigator Sindy Guadeloupe, MD as Consulting Physician (Hematology and Oncology)   Name of the patient: Eric Simon  XU:[id number]  08-24-1951   Date of visit: 08/24/21  HPI: Patient is a 70 y.o. male with progressive metastatic colon cancer, currently treated with Xeloda (capecitabine) in conjunction with irinotecan and panitumumab.  Reason for Consult: Oral chemotherapy follow-up for capecitabine therapy.   PAST MEDICAL HISTORY: Past Medical History:  Diagnosis Date   Colon cancer ([place])    COPD (chronic obstructive pulmonary disease) ([place])    Hyperlipidemia     HEMATOLOGY/ONCOLOGY HISTORY:  Oncology History  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 ([place])  06/19/2019 Cancer Staging   Staging form: Colon and Rectum, AJCC 8th Edition - Clinical stage from 06/19/2019: Stage Unknown (cTX, cNX, pM1) - Signed by Sindy Guadeloupe, MD on 06/20/2019   06/20/2019 Initial Diagnosis   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 ([place])   07/02/2019 - 07/01/2021 Chemotherapy          08/03/2021 -  Chemotherapy    Patient is on Treatment Plan: COLORECTAL FOLFIRI + PANITUMUMAB Q14D         ALLERGIES:  has No Known Allergies.  MEDICATIONS:  Current Outpatient Medications  Medication Sig Dispense Refill   allopurinol (ZYLOPRIM) 100 MG tablet      aspirin EC 81 MG tablet Take 81 mg by mouth daily.     azelastine (ASTELIN) 0.1 % nasal spray Place 1 spray into both nostrils 1 day or 1 dose.     butalbital-acetaminophen-caffeine (FIORICET) 50-325-40 MG tablet Take 1 tablet by mouth every 4 (four) hours as needed.     capecitabine (XELODA) 500 MG tablet Take 2 tablets (1,000 mg total) by mouth 2 (two) times daily after a meal. Take for 14 days,  then hold for 7 days. Repeat every 21 days. 56 tablet 0   cetirizine (ZYRTEC) 10 MG tablet Take 1 tablet (10 mg total) by mouth daily. 30 tablet 0   chlorthalidone (HYGROTON) 25 MG tablet Take 1 tablet by mouth daily.     DULoxetine (CYMBALTA) 30 MG capsule Take 1 capsule (30 mg total) by mouth 2 (two) times daily. 60 capsule 1   FAMOTIDINE PO Take by mouth. Take as needed.     fluticasone (FLONASE) 50 MCG/ACT nasal spray Place 2 sprays into both nostrils 1 day or 1 dose.      fluticasone (VERAMYST) 27.5 MCG/SPRAY nasal spray Place 2 sprays into the nose daily.     loperamide (IMODIUM A-D) 2 MG tablet Take 1 tablet (2 mg total) by mouth 4 (four) times daily as needed. Take 2 at diarrhea onset , then 1 every 2hr until 12hrs with no BM. May take 2 every 4hrs at night. If diarrhea recurs repeat. 100 tablet 1   magic mouthwash (multi-ingredient) oral suspension Swish and spit 5-10 mls by mouth 4 times a day as needed 480 mL 3   magic mouthwash w/lidocaine SOLN Take 5-10 mLs by mouth 4 (four) times daily as needed for mouth pain. 480 mL 3   montelukast (SINGULAIR) 10 MG tablet Take 10 mg by mouth at bedtime.     NEXLETOL 180 MG TABS Take 1 tablet by mouth daily.     nicotine (NICODERM CQ - DOSED IN MG/24 HOURS) 21 mg/24hr  patch Place 1 patch onto the skin 1 day or 1 dose. (Patient not taking: No sig reported)     oxyCODONE (OXY IR/ROXICODONE) 5 MG immediate release tablet Take 1 tablet (5 mg total) by mouth every 6 (six) hours as needed for severe pain. (Patient not taking: Reported on 08/24/2021) 30 tablet 0   potassium chloride SA (K-DUR) 20 MEQ tablet Take 1 tablet (20 mEq total) by mouth daily. (Patient not taking: No sig reported) 14 tablet 0   pregabalin (LYRICA) 75 MG capsule Take 1 capsule (75 mg total) by mouth 2 (two) times daily. 60 capsule 2   simvastatin (ZOCOR) 20 MG tablet Take 20 mg by mouth daily.      SYMBICORT 80-4.5 MCG/ACT inhaler Inhale 2 puffs into the lungs 1 day or 1 dose.       tamsulosin (FLOMAX) 0.4 MG CAPS capsule Take 0.4 mg by mouth.      No current facility-administered medications for this visit.   Facility-Administered Medications Ordered in Other Visits  Medication Dose Route Frequency Provider Last Rate Last Admin   irinotecan (CAMPTOSAR) 220 mg in sodium chloride 0.9 % 500 mL chemo infusion  125 mg/m2 (Treatment Plan Recorded) Intravenous Once Sindy Guadeloupe, MD       panitumumab (VECTIBIX) 400 mg in sodium chloride 0.9 % 100 mL chemo infusion  6 mg/kg (Treatment Plan Recorded) Intravenous Once Sindy Guadeloupe, MD 240 mL/hr at 08/24/21 1054 400 mg at 08/24/21 1054    VITAL SIGNS: There were no vitals taken for this visit. There were no vitals filed for this visit.  Estimated body mass index is 21.57 kg/m as calculated from the following:   Height as of an earlier encounter on 08/24/21: '5\' 7"'$  (1.702 m).   Weight as of an earlier encounter on 08/24/21: 62.5 kg (137 lb 11.2 oz).  LABS: CBC:    Component Value Date/Time   WBC 4.6 08/24/2021 0825   HGB 11.3 (L) 08/24/2021 0825   HGB 16.1 08/18/2012 1321   HCT 34.6 (L) 08/24/2021 0825   HCT 45.4 08/18/2012 1321   PLT 84 (L) 08/24/2021 0825   PLT 192 08/18/2012 1321   MCV 94.3 08/24/2021 0825   MCV 88 08/18/2012 1321   NEUTROABS 2.6 08/24/2021 0825   LYMPHSABS 1.4 08/24/2021 0825   MONOABS 0.4 08/24/2021 0825   EOSABS 0.2 08/24/2021 0825   BASOSABS 0.0 08/24/2021 0825   Comprehensive Metabolic Panel:    Component Value Date/Time   NA 137 08/24/2021 0825   NA 139 08/18/2012 1321   K 3.2 (L) 08/24/2021 0825   K 3.7 08/18/2012 1321   CL 106 08/24/2021 0825   CL 104 08/18/2012 1321   CO2 23 08/24/2021 0825   CO2 27 08/18/2012 1321   BUN 22 08/24/2021 0825   BUN 18 08/18/2012 1321   CREATININE 0.86 08/24/2021 0825   CREATININE 0.82 08/18/2012 1321   GLUCOSE 132 (H) 08/24/2021 0825   GLUCOSE 68 08/18/2012 1321   CALCIUM 8.8 (L) 08/24/2021 0825   CALCIUM 9.0 08/18/2012 1321   AST 42 (H)  08/24/2021 0825   AST 36 08/18/2012 1321   ALT 24 08/24/2021 0825   ALT 51 08/18/2012 1321   ALKPHOS 106 08/24/2021 0825   ALKPHOS 88 08/18/2012 1321   BILITOT 0.6 08/24/2021 0825   BILITOT 0.4 08/18/2012 1321   PROT 6.8 08/24/2021 0825   PROT 8.0 08/18/2012 1321   ALBUMIN 3.6 08/24/2021 0825   ALBUMIN 4.1 08/18/2012 1321  Present during today's visit: patient only, seen in infusion  Assessment and Plan: Pltc decreased today, dose reduced to capecitabine '1000mg'$  twice daily 14on/7off   Oral Chemotherapy Side Effect/Intolerance:  Mouth sores: prescription was sent in for magic mouthwash, he plans on picking this up from the pharmacy today Fatigue: tolerable and no worse than last visit No reported hand-foot syndrome, diarrhea,or nausea  Oral Chemotherapy Adherence: no missed doses reported No patient barriers to medication adherence identified.   New medications: no reported  Medication Access Issues: Medication was to be delivered by [facility] on Saturday. Order cancelled in error in pharmacy system, pharmacy will deliver his medication tomorrow 08/25/21. Mr. Kottman knows to start on schedule with his capecitabine and keep his last day of capecitabine as 09/06/21.  Patient expressed understanding and was in agreement with this plan. He also understands that He can call clinic at any time with any questions, concerns, or complaints.    Thank you for allowing me to participate in the care of this very pleasant patient.   Time Total: 10   Visit consisted of counseling and education on dealing with issues of symptom management in the setting of serious and potentially life-threatening illness.Greater than 50%  of this time was spent counseling and coordinating care related to the above assessment and plan.  Signed by: Darl Pikes, PharmD, BCPS, Salley Slaughter, CPP Hematology/Oncology Clinical Pharmacist Practitioner [facility]/HP/AP [facility] [phone]  08/24/2021 11:08 AM

## 2021-08-25 LAB — CEA: CEA: 4.6 ng/mL (ref 0.0–4.7)

## 2021-08-26 ENCOUNTER — Other Ambulatory Visit (HOSPITAL_COMMUNITY): Payer: Self-pay

## 2021-09-07 ENCOUNTER — Other Ambulatory Visit: Payer: Self-pay | Admitting: Oncology

## 2021-09-07 ENCOUNTER — Other Ambulatory Visit (HOSPITAL_COMMUNITY): Payer: Self-pay

## 2021-09-07 DIAGNOSIS — C772 Secondary and unspecified malignant neoplasm of intra-abdominal lymph nodes: Secondary | ICD-10-CM

## 2021-09-07 DIAGNOSIS — C189 Malignant neoplasm of colon, unspecified: Secondary | ICD-10-CM

## 2021-09-07 MED ORDER — CAPECITABINE 500 MG PO TABS
1000.0000 mg | ORAL_TABLET | Freq: Two times a day (BID) | ORAL | 0 refills | Status: DC
  Filled 2021-09-07: qty 56, 21d supply, fill #0

## 2021-09-09 ENCOUNTER — Other Ambulatory Visit (HOSPITAL_COMMUNITY): Payer: Self-pay

## 2021-09-14 ENCOUNTER — Inpatient Hospital Stay: Payer: Medicare HMO | Attending: Oncology

## 2021-09-14 ENCOUNTER — Inpatient Hospital Stay (HOSPITAL_BASED_OUTPATIENT_CLINIC_OR_DEPARTMENT_OTHER): Payer: Medicare HMO | Admitting: Oncology

## 2021-09-14 ENCOUNTER — Other Ambulatory Visit (HOSPITAL_COMMUNITY): Payer: Self-pay

## 2021-09-14 ENCOUNTER — Inpatient Hospital Stay: Payer: Medicare HMO

## 2021-09-14 ENCOUNTER — Encounter: Payer: Self-pay | Admitting: Oncology

## 2021-09-14 VITALS — BP 123/69 | HR 81 | Temp 97.2°F | Resp 16 | Wt 141.2 lb

## 2021-09-14 DIAGNOSIS — D6959 Other secondary thrombocytopenia: Secondary | ICD-10-CM | POA: Diagnosis not present

## 2021-09-14 DIAGNOSIS — C187 Malignant neoplasm of sigmoid colon: Secondary | ICD-10-CM | POA: Insufficient documentation

## 2021-09-14 DIAGNOSIS — Z452 Encounter for adjustment and management of vascular access device: Secondary | ICD-10-CM | POA: Insufficient documentation

## 2021-09-14 DIAGNOSIS — Z5112 Encounter for antineoplastic immunotherapy: Secondary | ICD-10-CM | POA: Insufficient documentation

## 2021-09-14 DIAGNOSIS — T451X5A Adverse effect of antineoplastic and immunosuppressive drugs, initial encounter: Secondary | ICD-10-CM

## 2021-09-14 DIAGNOSIS — C189 Malignant neoplasm of colon, unspecified: Secondary | ICD-10-CM

## 2021-09-14 DIAGNOSIS — Z5111 Encounter for antineoplastic chemotherapy: Secondary | ICD-10-CM

## 2021-09-14 DIAGNOSIS — C772 Secondary and unspecified malignant neoplasm of intra-abdominal lymph nodes: Secondary | ICD-10-CM | POA: Diagnosis not present

## 2021-09-14 DIAGNOSIS — E876 Hypokalemia: Secondary | ICD-10-CM | POA: Insufficient documentation

## 2021-09-14 DIAGNOSIS — C786 Secondary malignant neoplasm of retroperitoneum and peritoneum: Secondary | ICD-10-CM | POA: Insufficient documentation

## 2021-09-14 DIAGNOSIS — Z79899 Other long term (current) drug therapy: Secondary | ICD-10-CM

## 2021-09-14 LAB — COMPREHENSIVE METABOLIC PANEL
ALT: 22 U/L (ref 0–44)
AST: 38 U/L (ref 15–41)
Albumin: 3.8 g/dL (ref 3.5–5.0)
Alkaline Phosphatase: 106 U/L (ref 38–126)
Anion gap: 9 (ref 5–15)
BUN: 26 mg/dL — ABNORMAL HIGH (ref 8–23)
CO2: 23 mmol/L (ref 22–32)
Calcium: 9 mg/dL (ref 8.9–10.3)
Chloride: 105 mmol/L (ref 98–111)
Creatinine, Ser: 0.89 mg/dL (ref 0.61–1.24)
GFR, Estimated: >60 mL/min (ref >60)
Glucose, Bld: 148 mg/dL — ABNORMAL HIGH (ref 70–99)
Potassium: 3.2 mmol/L — ABNORMAL LOW (ref 3.5–5.1)
Sodium: 137 mmol/L (ref 135–145)
Total Bilirubin: 0.9 mg/dL (ref 0.3–1.2)
Total Protein: 7.5 g/dL (ref 6.5–8.1)

## 2021-09-14 LAB — CBC WITH DIFFERENTIAL/PLATELET
Abs Immature Granulocytes: 0.03 10*3/uL (ref 0.00–0.07)
Basophils Absolute: 0.1 10*3/uL (ref 0.0–0.1)
Basophils Relative: 1 %
Eosinophils Absolute: 0.1 10*3/uL (ref 0.0–0.5)
Eosinophils Relative: 3 %
HCT: 39.1 % (ref 39.0–52.0)
Hemoglobin: 12.9 g/dL — ABNORMAL LOW (ref 13.0–17.0)
Immature Granulocytes: 1 %
Lymphocytes Relative: 28 %
Lymphs Abs: 1.5 10*3/uL (ref 0.7–4.0)
MCH: 31.7 pg (ref 26.0–34.0)
MCHC: 33 g/dL (ref 30.0–36.0)
MCV: 96.1 fL (ref 80.0–100.0)
Monocytes Absolute: 0.5 10*3/uL (ref 0.1–1.0)
Monocytes Relative: 9 %
Neutro Abs: 3.1 10*3/uL (ref 1.7–7.7)
Neutrophils Relative %: 58 %
Platelets: 85 10*3/uL — ABNORMAL LOW (ref 150–400)
RBC: 4.07 MIL/uL — ABNORMAL LOW (ref 4.22–5.81)
RDW: 25.6 % — ABNORMAL HIGH (ref 11.5–15.5)
WBC: 5.2 10*3/uL (ref 4.0–10.5)
nRBC: 0 % (ref 0.0–0.2)

## 2021-09-14 LAB — MAGNESIUM: Magnesium: 1.6 mg/dL — ABNORMAL LOW (ref 1.7–2.4)

## 2021-09-14 MED ORDER — SODIUM CHLORIDE 0.9 % IV SOLN
6.0000 mg/kg | Freq: Once | INTRAVENOUS | Status: AC
  Administered 2021-09-14: 400 mg via INTRAVENOUS
  Filled 2021-09-14: qty 20

## 2021-09-14 MED ORDER — POTASSIUM CHLORIDE CRYS ER 20 MEQ PO TBCR: 20.0000 meq | EXTENDED_RELEASE_TABLET | Freq: Every day | ORAL | 0 refills | Status: DC

## 2021-09-14 MED ORDER — SODIUM CHLORIDE 0.9 % IV SOLN
125.0000 mg/m2 | Freq: Once | INTRAVENOUS | Status: AC
  Administered 2021-09-14: 220 mg via INTRAVENOUS
  Filled 2021-09-14: qty 10

## 2021-09-14 MED ORDER — SODIUM CHLORIDE 0.9 % IV SOLN
10.0000 mg | Freq: Once | INTRAVENOUS | Status: AC
  Administered 2021-09-14: 10 mg via INTRAVENOUS
  Filled 2021-09-14: qty 10

## 2021-09-14 MED ORDER — PALONOSETRON HCL INJECTION 0.25 MG/5ML
0.2500 mg | Freq: Once | INTRAVENOUS | Status: AC
  Administered 2021-09-14: 0.25 mg via INTRAVENOUS
  Filled 2021-09-14: qty 5

## 2021-09-14 MED ORDER — SODIUM CHLORIDE 0.9% FLUSH
10.0000 mL | Freq: Once | INTRAVENOUS | Status: AC
  Administered 2021-09-14: 10 mL via INTRAVENOUS
  Filled 2021-09-14: qty 10

## 2021-09-14 MED ORDER — SODIUM CHLORIDE 0.9 % IV SOLN
Freq: Once | INTRAVENOUS | Status: AC
  Filled 2021-09-14: qty 250

## 2021-09-14 MED ORDER — MAGNESIUM SULFATE 2 GM/50ML IV SOLN
2.0000 g | Freq: Once | INTRAVENOUS | Status: AC
  Administered 2021-09-14: 2 g via INTRAVENOUS
  Filled 2021-09-14: qty 50

## 2021-09-14 MED ORDER — HEPARIN SOD (PORK) LOCK FLUSH 100 UNIT/ML IV SOLN
500.0000 [IU] | Freq: Once | INTRAVENOUS | Status: AC | PRN
  Filled 2021-09-14: qty 5

## 2021-09-14 MED ORDER — ATROPINE SULFATE 1 MG/ML IJ SOLN
0.5000 mg | Freq: Once | INTRAMUSCULAR | Status: AC | PRN
  Administered 2021-09-14: 0.5 mg via INTRAVENOUS
  Filled 2021-09-14: qty 1

## 2021-09-14 MED ORDER — HEPARIN SOD (PORK) LOCK FLUSH 100 UNIT/ML IV SOLN
INTRAVENOUS | Status: AC
  Administered 2021-09-14: 500 [IU]
  Filled 2021-09-14: qty 5

## 2021-09-14 NOTE — Progress Notes (Signed)
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 He currently smokes 0.5ppd. he presented to the ER with symptoms of left sided chest pain and left arm pain. Troponin was negative ekg was unremarkable. Ct chest showed no PE. He was incidentally noted to have mediastinal and retrocrural adenopathy and a 2.1X2.3X4.4 cm retroaortic soft tissue lesion in the posterior left chest. 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  Low-grade activity in the left lateral fifth and sixth ribs associated with nondisplaced fractures likely reflecting healing response problem malignancy.   Patient underwent CT-guided biopsy of the retroperitoneal lymph node pathology showed metastatic adenocarcinoma compatible with colorectal origin.  CK7 negative.  CK20 positive.  CDX 2+.  TTF-1 negative.  PSA negative.  This pattern of immunoreactivity supports the above diagnosis. Patient underwent colonoscopy which showed sigmoid mass that was consistent with adenocarcinoma.  RAS panel testing showed that he was wild-type for both K-ras and BRAF   FOLFOX and bevacizumab chemotherapy started in July 2020.  Subsequently oxaliplatin has been on hold given neuropathy.  Recent scans  have shown slow increase in the size of intra-abdominal adenopathy but patient continues to have low-volume disease.plan is to switch him to second line Xeloda irinotecan cetuximab regimen      Interval history-patient reports tolerating present treatment well without any significant side effects.  Denies any skin rash or diarrhea.  Denies any nausea vomiting.  Reports that his appetite has actually improved and he has gained4 pounds since last visit.  ECOG PS- 1 Pain scale- 0   Review of systems- Review of Systems  Constitutional:  Negative for chills, fever, malaise/fatigue and weight loss.  HENT:  Negative for congestion, ear discharge and nosebleeds.   Eyes:  Negative for blurred vision.  Respiratory:  Negative for cough, hemoptysis, sputum production, shortness of breath and wheezing.   Cardiovascular:  Negative for chest pain, palpitations, orthopnea and claudication.  Gastrointestinal:  Negative for abdominal pain, blood in stool, constipation, diarrhea, heartburn, melena, nausea and vomiting.  Genitourinary:  Negative for dysuria, flank pain, frequency, hematuria and urgency.  Musculoskeletal:  Negative for back pain, joint pain and myalgias.  Skin:  Negative for rash.  Neurological:  Negative for dizziness, tingling, focal weakness, seizures, weakness and headaches.  Endo/Heme/Allergies:  Does not bruise/bleed easily.  Psychiatric/Behavioral:  Negative for depression and suicidal ideas. The patient does not have insomnia.       No Known Allergies   Past Medical History:  Diagnosis Date   Colon cancer ([place])    COPD (chronic obstructive pulmonary disease) ([place])    Hyperlipidemia      Past Surgical History:  Procedure Laterality Date   COLONOSCOPY WITH PROPOFOL N/A 06/26/2019   Procedure: COLONOSCOPY WITH PROPOFOL;  Surgeon: [name], MD;  Location: [facility] ENDOSCOPY;  Service: Gastroenterology;  Laterality: N/A;  FLEXIBLE SIGMOIDOSCOPY N/A 05/07/2021   Procedure:  FLEXIBLE SIGMOIDOSCOPY;  Surgeon: [name], MD;  Location: [facility] ENDOSCOPY;  Service: Gastroenterology;  Laterality: N/A;   PORTA CATH INSERTION N/A 06/25/2019   Procedure: PORTA CATH INSERTION;  Surgeon: Algernon Huxley, MD;  Location: [facility] CV LAB;  Service: Cardiovascular;  Laterality: N/A;   PORTA CATH INSERTION Left 08/11/2020   Procedure: PORTA CATH INSERTION;  Surgeon: Algernon Huxley, MD;  Location: [place] CV LAB;  Service: Cardiovascular;  Laterality: Left;   PORTA CATH REMOVAL Right 08/11/2020   Procedure: PORTA CATH REMOVAL;  Surgeon: Algernon Huxley, MD;  Location: [place] CV LAB;  Service: Cardiovascular;  Laterality: Right;    Social History   Socioeconomic History   Marital status: Single    Spouse name: Not on file   Number of children: Not on file   Years of education: Not on file   Highest education level: Not on file  Occupational History   Not on file  Tobacco Use   Smoking status: Every Day    Packs/day: 0.50    Years: 40.00    Pack years: 20.00    Types: Cigarettes   Smokeless tobacco: Never   Tobacco comments:    smoking 40 years  Vaping Use   Vaping Use: Never used  Substance and Sexual Activity   Alcohol use: No   Drug use: No   Sexual activity: Not Currently  Other Topics Concern   Not on file  Social History Narrative   Not on file   Social Determinants of Health   Financial Resource Strain: Not on file  Food Insecurity: Not on file  Transportation Needs: Not on file  Physical Activity: Not on file  Stress: Not on file  Social Connections: Not on file  Intimate Partner Violence: Not on file    Family History  Problem Relation Age of Onset   Brain cancer Father      Current Outpatient Medications:    allopurinol (ZYLOPRIM) 100 MG tablet, , Disp: , Rfl:    aspirin EC 81 MG tablet, Take 81 mg by mouth daily., Disp: , Rfl:    azelastine (ASTELIN) 0.1 % nasal spray, Place 1 spray into both nostrils 1 day or 1 dose., Disp: ,  Rfl:    butalbital-acetaminophen-caffeine (FIORICET) 50-325-40 MG tablet, Take 1 tablet by mouth every 4 (four) hours as needed., Disp: , Rfl:    capecitabine (XELODA) 500 MG tablet, Take 2 tablets (1,000 mg total) by mouth 2 (two) times daily after a meal. Take for 14 days, then hold for 7 days. Repeat every 21 days., Disp: 56 tablet, Rfl: 0   cetirizine (ZYRTEC) 10 MG tablet, Take 1 tablet (10 mg total) by mouth daily., Disp: 30 tablet, Rfl: 0   chlorthalidone (HYGROTON) 25 MG tablet, Take 1 tablet by mouth daily., Disp: , Rfl:    dronabinol (MARINOL) 2.5 MG capsule, , Disp: , Rfl:    DULoxetine (CYMBALTA) 30 MG capsule, Take 1 capsule (30 mg total) by mouth 2 (two) times daily., Disp: 60 capsule, Rfl: 1   FAMOTIDINE PO, Take by mouth. Take as needed., Disp: , Rfl:    fluticasone (FLONASE) 50 MCG/ACT nasal spray, Place 2 sprays into both nostrils 1 day or 1 dose. , Disp: , Rfl:    fluticasone (VERAMYST) 27.5 MCG/SPRAY nasal spray, Place 2 sprays into the nose daily., Disp: , Rfl:    loperamide (IMODIUM A-D) 2 MG tablet, Take 1 tablet (2 mg total)  by mouth 4 (four) times daily as needed. Take 2 at diarrhea onset , then 1 every 2hr until 12hrs with no BM. May take 2 every 4hrs at night. If diarrhea recurs repeat., Disp: 100 tablet, Rfl: 1   magic mouthwash (multi-ingredient) oral suspension, Swish and spit 5-10 mls by mouth 4 times a day as needed, Disp: 480 mL, Rfl: 3   magic mouthwash w/lidocaine SOLN, Take 5-10 mLs by mouth 4 (four) times daily as needed for mouth pain., Disp: 480 mL, Rfl: 3   montelukast (SINGULAIR) 10 MG tablet, Take 10 mg by mouth at bedtime., Disp: , Rfl:    NEXLETOL 180 MG TABS, Take 1 tablet by mouth daily., Disp: , Rfl:    pregabalin (LYRICA) 75 MG capsule, Take 1 capsule (75 mg total) by mouth 2 (two) times daily., Disp: 60 capsule, Rfl: 2   simvastatin (ZOCOR) 20 MG tablet, Take 20 mg by mouth daily. , Disp: , Rfl:    SYMBICORT 80-4.5 MCG/ACT inhaler, Inhale 2 puffs into  the lungs 1 day or 1 dose. , Disp: , Rfl:    tamsulosin (FLOMAX) 0.4 MG CAPS capsule, Take 0.4 mg by mouth. , Disp: , Rfl:    nicotine (NICODERM CQ - DOSED IN MG/24 HOURS) 21 mg/24hr patch, Place 1 patch onto the skin 1 day or 1 dose. (Patient not taking: No sig reported), Disp: , Rfl:    oxyCODONE (OXY IR/ROXICODONE) 5 MG immediate release tablet, Take 1 tablet (5 mg total) by mouth every 6 (six) hours as needed for severe pain. (Patient not taking: No sig reported), Disp: 30 tablet, Rfl: 0   potassium chloride SA (KLOR-CON) 20 MEQ tablet, Take 1 tablet (20 mEq total) by mouth daily., Disp: 30 tablet, Rfl: 0  Physical exam:  Vitals:   09/14/21 0902  BP: 123/69  Pulse: 81  Resp: 16  Temp: (!) 97.2 F (36.2 C)  SpO2: 100%  Weight: 141 lb 3.2 oz (64 kg)   Physical Exam Constitutional:      General: He is not in acute distress. Cardiovascular:     Rate and Rhythm: Normal rate and regular rhythm.     Heart sounds: Normal heart sounds.  Pulmonary:     Effort: Pulmonary effort is normal.     Breath sounds: Normal breath sounds.  Abdominal:     General: Bowel sounds are normal.     Palpations: Abdomen is soft.  Skin:    General: Skin is warm and dry.  Neurological:     Mental Status: He is alert and oriented to person, place, and time.     CMP Latest Ref Rng & Units 09/14/2021  Glucose 70 - 99 mg/dL 148(H)  BUN 8 - 23 mg/dL 26(H)  Creatinine 0.61 - 1.24 mg/dL 0.89  Sodium 135 - 145 mmol/L 137  Potassium 3.5 - 5.1 mmol/L 3.2(L)  Chloride 98 - 111 mmol/L 105  CO2 22 - 32 mmol/L 23  Calcium 8.9 - 10.3 mg/dL 9.0  Total Protein 6.5 - 8.1 g/dL 7.5  Total Bilirubin 0.3 - 1.2 mg/dL 0.9  Alkaline Phos 38 - 126 U/L 106  AST 15 - 41 U/L 38  ALT 0 - 44 U/L 22   CBC Latest Ref Rng & Units 09/14/2021  WBC 4.0 - 10.5 K/uL 5.2  Hemoglobin 13.0 - 17.0 g/dL 12.9(L)  Hematocrit 39.0 - 52.0 % 39.1  Platelets 150 - 400 K/uL 85(L)     Assessment and plan- Patient is a 70 y.o. male with  metastatic K-ras wild-type colon cancer with intra-abdominal lymph node metastases.  He is here for on treatment assessment prior to cycle 3 of panitumumab and irinotecan  Patient has not received his next shipment of Xeloda yet.  He started taking some leftover Xeloda which will last him for the next 2 days.  He is receiving a lower dose of 1000 mg twice daily due to his thrombocytopenia.  He will proceed with panitumumab and irinotecan today.  Irinotecan has also been dose reduced to 125 mg per metered square given his ongoing thrombocytopenia.  Overall patient is tolerating treatment well and reports that his appetite and weight has improved since starting this treatment.  His CEAHas come down from 7.8-4.6 indicating response to treatment.  I will see him back in 3 weeks for cycle 4 of panitumumab and irinotecan with port labs CBC with differential CMP and CEA  Hypomagnesemia: We will give him 2 g of IV mag today  Hypokalemia: We will send him home with 20 mEq of oral potassium which she will continue to take 1 tablet daily   Visit Diagnosis 1. 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 ([place])   2. High risk medication use   3. Encounter for antineoplastic chemotherapy   4. Encounter for monoclonal antibody treatment for malignancy   5. Chemotherapy-induced thrombocytopenia   6. Hypomagnesemia   7. Hypokalemia      Dr. Randa Evens, MD, MPH [facility] [phone] 09/14/2021 10:19 PM

## 2021-09-14 NOTE — Patient Instructions (Signed)
[facility] ONCOLOGY  Discharge Instructions: Thank you for choosing [facility] to provide your oncology and hematology care.  If you have a lab appointment with the [facility], please go directly to the [facility] and check in at the registration area.  Wear comfortable clothing and clothing appropriate for easy access to any Portacath or PICC line.   We strive to give you quality time with your provider. You may need to reschedule your appointment if you arrive late (15 or more minutes).  Arriving late affects you and other patients whose appointments are after yours.  Also, if you miss three or more appointments without notifying the office, you may be dismissed from the clinic at the provider's discretion.      For prescription refill requests, have your pharmacy contact our office and allow 72 hours for refills to be completed.    Today you received the following chemotherapy and/or immunotherapy agents Panitumumab and Irinotecan       To help prevent nausea and vomiting after your treatment, we encourage you to take your nausea medication as directed.  BELOW ARE SYMPTOMS THAT SHOULD BE REPORTED IMMEDIATELY: *FEVER GREATER THAN 100.4 F (38 C) OR HIGHER *CHILLS OR SWEATING *NAUSEA AND VOMITING THAT IS NOT CONTROLLED WITH YOUR NAUSEA MEDICATION *UNUSUAL SHORTNESS OF BREATH *UNUSUAL BRUISING OR BLEEDING *URINARY PROBLEMS (pain or burning when urinating, or frequent urination) *BOWEL PROBLEMS (unusual diarrhea, constipation, pain near the anus) TENDERNESS IN MOUTH AND THROAT WITH OR WITHOUT PRESENCE OF ULCERS (sore throat, sores in mouth, or a toothache) UNUSUAL RASH, SWELLING OR PAIN  UNUSUAL VAGINAL DISCHARGE OR ITCHING   Items with * indicate a potential emergency and should be followed up as soon as possible or go to the Emergency Department if any problems should occur.  Please show the CHEMOTHERAPY ALERT CARD or IMMUNOTHERAPY ALERT  CARD at check-in to the Emergency Department and triage nurse.  Should you have questions after your visit or need to cancel or reschedule your appointment, please contact [facility]  [phone] and follow the prompts.  Office hours are 8:00 a.m. to 4:30 p.m. Monday - Friday. Please note that voicemails left after 4:00 p.m. may not be returned until the following business day.  We are closed weekends and major holidays. You have access to a nurse at all times for urgent questions. Please call the main number to the clinic [phone] and follow the prompts.  For any non-urgent questions, you may also contact your provider using MyChart. We now offer e-Visits for anyone 32 and older to request care online for non-urgent symptoms. For details visit [email or web address].   Also download the MyChart app! Go to the app store, search "MyChart", open the app, select [facility], and log in with your MyChart username and password.  Due to Covid, a mask is required upon entering the hospital/clinic. If you do not have a mask, one will be given to you upon arrival. For doctor visits, patients may have 1 support person aged 31 or older with them. For treatment visits, patients cannot have anyone with them due to current Covid guidelines and our immunocompromised population.

## 2021-09-14 NOTE — Progress Notes (Signed)
Magnesium 1.6, Potassium 3.2 and platelets 85. Per Dr. [name] okay to proceed with treatment. Pt torecieve 2 g Magnesium IV today and per Dr. [name] PO Potassium will be called into pharmacy.  Pt aware.

## 2021-09-21 ENCOUNTER — Other Ambulatory Visit: Payer: Medicare HMO

## 2021-09-21 ENCOUNTER — Ambulatory Visit: Payer: Medicare HMO

## 2021-09-21 ENCOUNTER — Ambulatory Visit: Payer: Medicare HMO | Admitting: Oncology

## 2021-09-24 ENCOUNTER — Other Ambulatory Visit (HOSPITAL_COMMUNITY): Payer: Self-pay

## 2021-09-24 ENCOUNTER — Other Ambulatory Visit: Payer: Self-pay | Admitting: Oncology

## 2021-09-24 DIAGNOSIS — C189 Malignant neoplasm of colon, unspecified: Secondary | ICD-10-CM

## 2021-09-24 MED ORDER — CAPECITABINE 500 MG PO TABS
1000.0000 mg | ORAL_TABLET | Freq: Two times a day (BID) | ORAL | 0 refills | Status: DC
  Filled 2021-09-24: qty 56, 21d supply, fill #0

## 2021-09-29 ENCOUNTER — Other Ambulatory Visit (HOSPITAL_COMMUNITY): Payer: Self-pay

## 2021-09-30 ENCOUNTER — Other Ambulatory Visit (HOSPITAL_COMMUNITY): Payer: Self-pay

## 2021-09-30 IMAGING — CT CT ABD-PELV W/ CM
2 of 5 series · 14 of 46 positions shown, 16 images · IV contrast (omnipaque)
Comparison: PET-CT dated 06/06/2019.  CTA chest dated 05/24/2019.

CLINICAL DATA: Restaging colon cancer, diagnosed 4 months ago

EXAM:
CT CHEST, ABDOMEN, AND PELVIS WITH CONTRAST
TECHNIQUE: Multidetector CT imaging of the chest, abdomen and pelvis was
performed following the standard protocol during bolus
administration of intravenous contrast.
CONTRAST:  100mL OMNIPAQUE IOHEXOL 300 MG/ML  SOLN

[Series 2: axials cap · axial · 0.72mm/px · z∈[-1498,-943]mm · 11 of 133 slices shown, 13 images]
[im 11/133  soft-tissue]
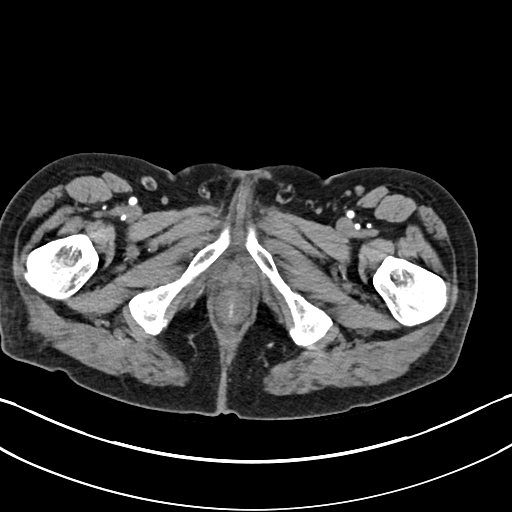
[im 11/133  bone]
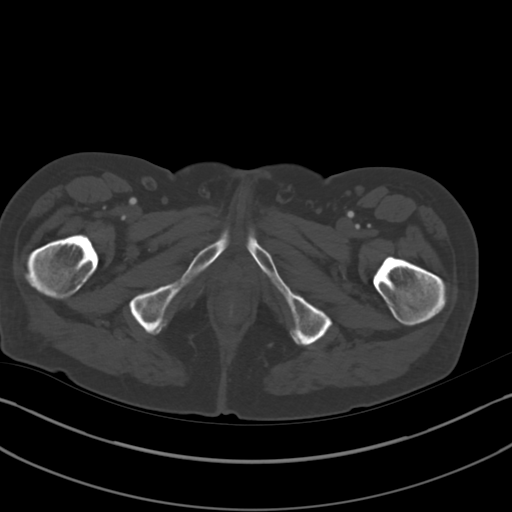
[im 21/133  soft-tissue]
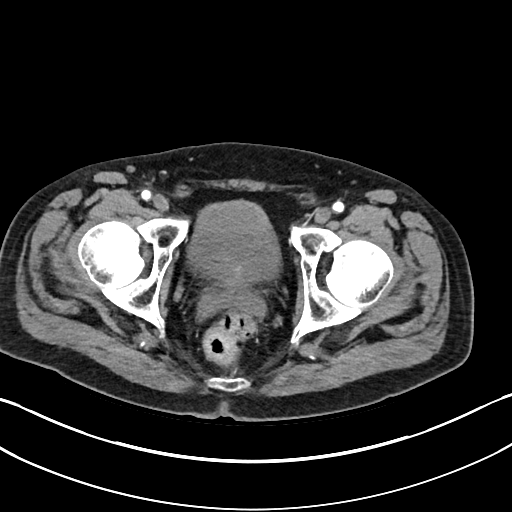
[im 31/133  soft-tissue]
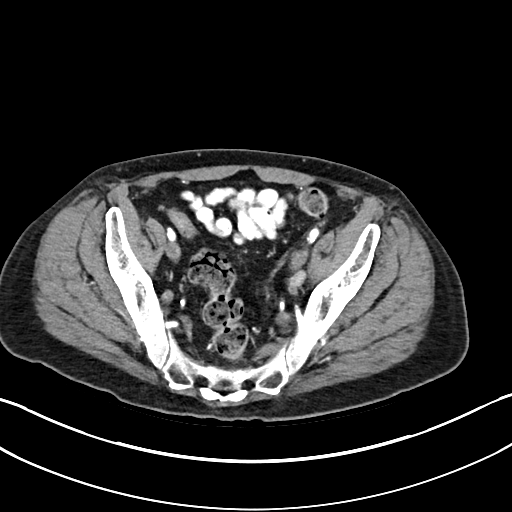
[im 41/133  soft-tissue]
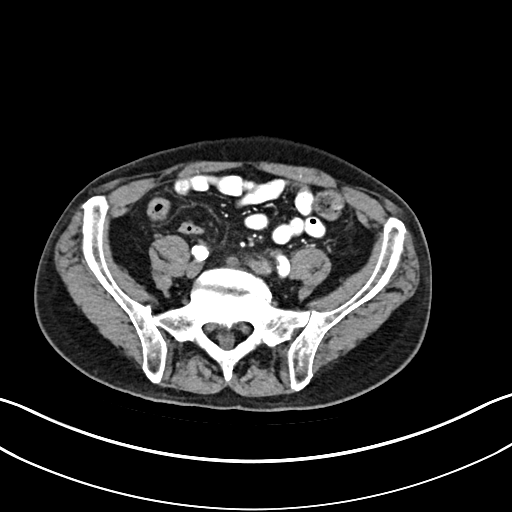
[im 51/133  soft-tissue]
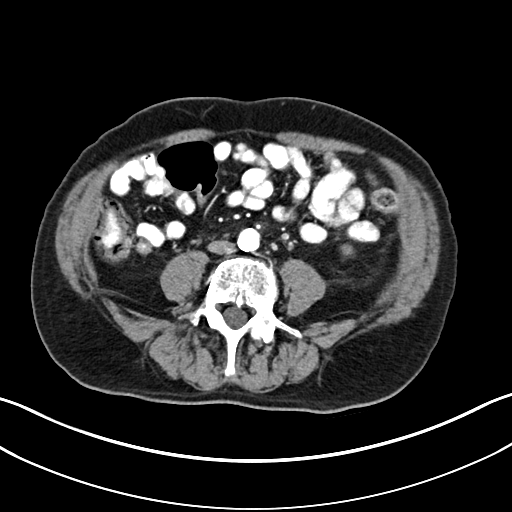
[im 72/133  soft-tissue]
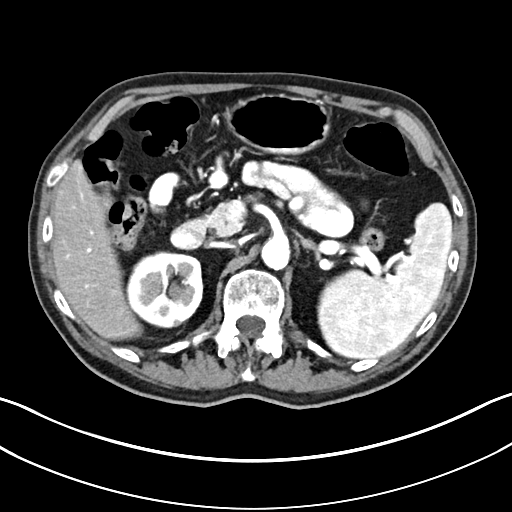
[im 82/133  soft-tissue]
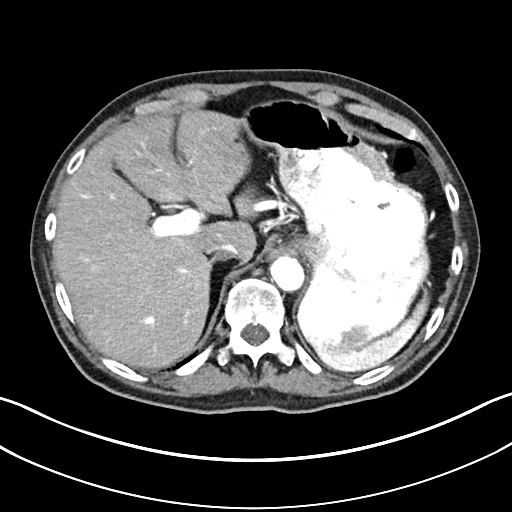
[im 92/133  soft-tissue]
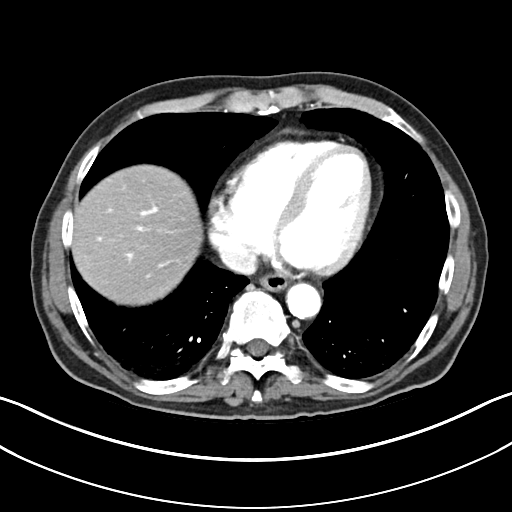
[im 102/133  soft-tissue]
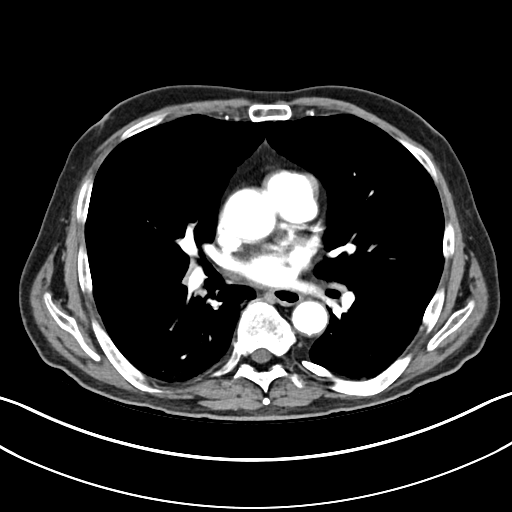
[im 102/133  bone]
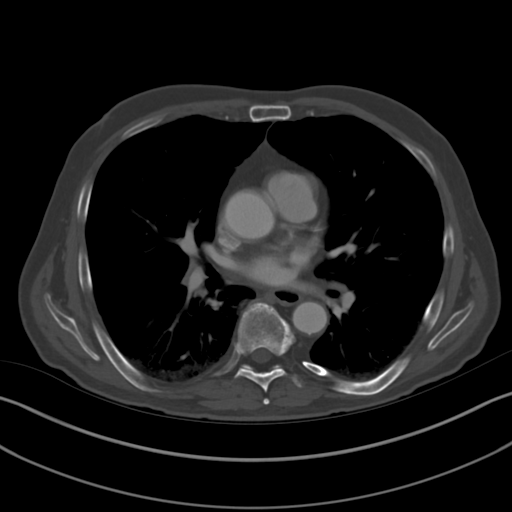
[im 112/133  soft-tissue]
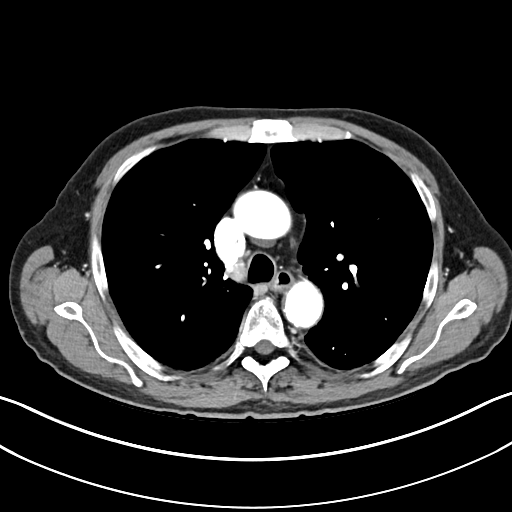
[im 122/133  soft-tissue]
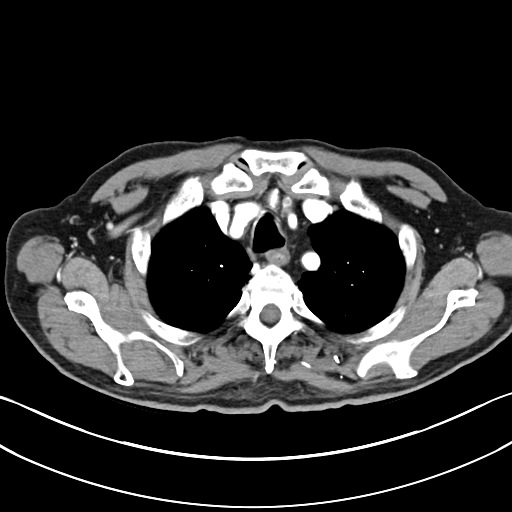

[Series 4: coronals cap · coronal · 0.72mm/px · 3 of 127 slices shown]
[im 43/127  soft-tissue]
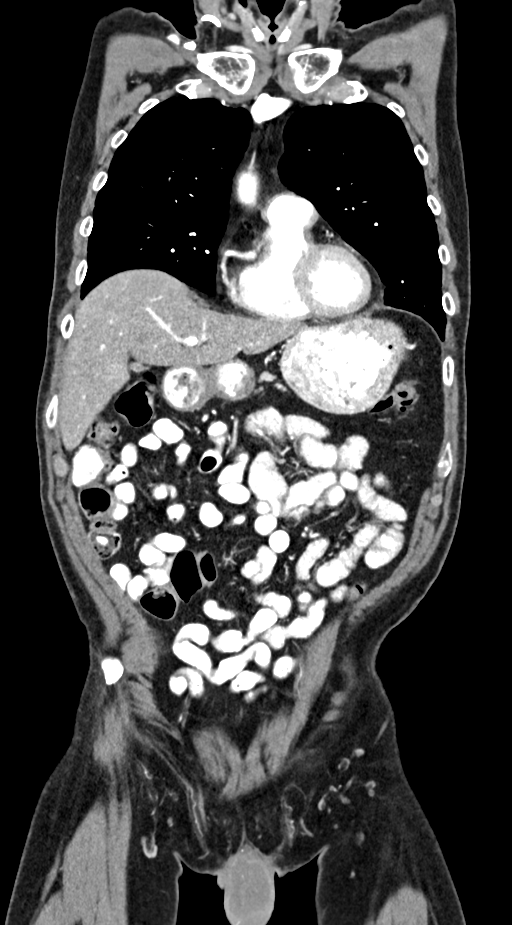
[im 57/127  soft-tissue]
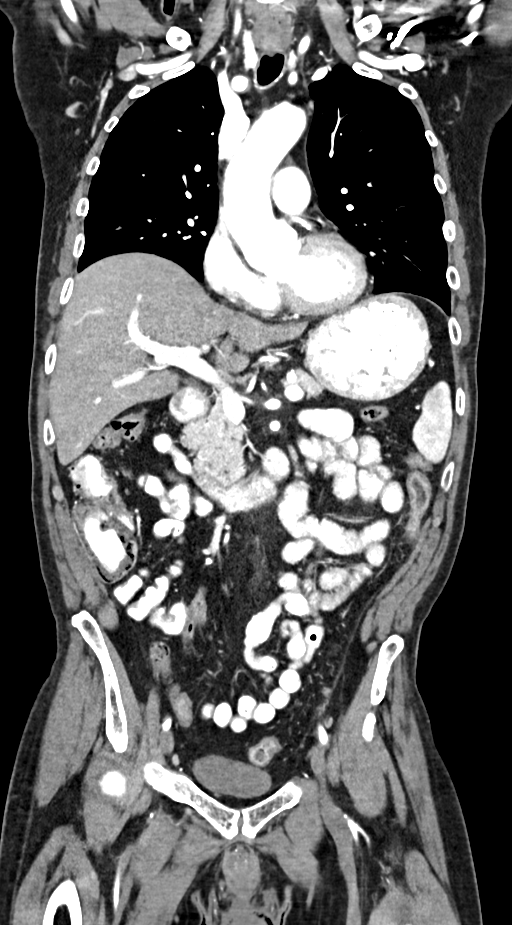
[im 71/127  soft-tissue]
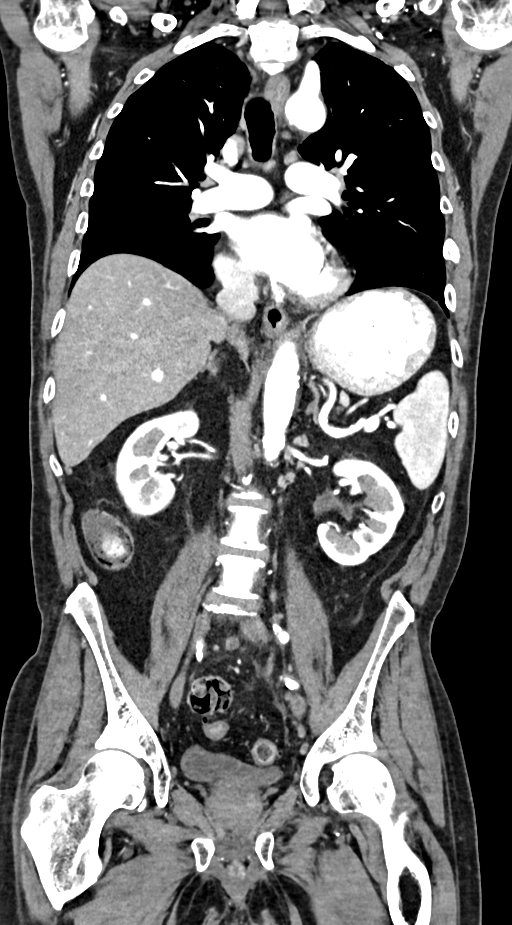

[14 of 46 positions shown; findings below may reference images not displayed]

FINDINGS: CT CHEST FINDINGS

Cardiovascular: The heart is normal in size. No pericardial
effusion.

No evidence of thoracic aortic aneurysm. Atherosclerotic
calcifications of the aortic arch.

Mild coronary atherosclerosis of the LAD.

Right chest port terminates the cavoatrial junction.

Mediastinum/Nodes: 9 mm short axis subcarinal node, previously 12
mm. No suspicious mediastinal lymphadenopathy.

Visualized thyroid is unremarkable.

Lungs/Pleura: Mild to moderate centrilobular and paraseptal
emphysematous changes, upper lung predominant.

Dependent atelectasis in the bilateral lower lobes.

No focal consolidation.

No pleural effusion or pneumothorax.

Musculoskeletal: Old left lateral 5th and 6th rib fracture
deformities.

Thoracic spine is within normal limits.

CT ABDOMEN PELVIS FINDINGS

Hepatobiliary: Liver is within normal limits.

Gallbladder is unremarkable. No intrahepatic or extrahepatic ductal
dilatation.

Pancreas: Within normal limits.

Spleen: Spleen is normal in size.

Adrenals/Urinary Tract: Adrenal glands are within normal limits.

Kidneys are within normal limits.  No hydronephrosis.

Bladder is within normal limits.

Stomach/Bowel: Stomach is within normal limits.

No evidence of bowel obstruction.

Normal appendix (series 2/image 71).

No colonic mass is seen in this patient with known colon cancer.

Vascular/Lymphatic: No evidence of abdominal aortic aneurysm.

Atherosclerotic calcifications of the abdominal aorta and branch
vessels.

Small abdominopelvic nodes, improved from PET, including:

--6 mm short axis right retrocrural node (series 2/image 51),
previously 12 mm

--8 mm short axis aortocaval node (series 2/image 31), previously 20
mm

--9 mm short axis left common iliac node (series 2/image 86),
previously 16 mm

--10 mm short axis left pelvic sidewall node (series 2/image 105),
previously 19 mm

Reproductive: Mild prostatomegaly.

Other: No abdominopelvic ascites.

Musculoskeletal: Mild degenerative changes of the lower lumbar
spine.
IMPRESSION: Improving lymphadenopathy in the chest, abdomen, and pelvis, as
above.

No colonic mass is seen in this patient with known colon cancer.

Additional ancillary findings as above.

## 2021-10-01 DIAGNOSIS — E785 Hyperlipidemia, unspecified: Secondary | ICD-10-CM | POA: Diagnosis not present

## 2021-10-05 DIAGNOSIS — F17200 Nicotine dependence, unspecified, uncomplicated: Secondary | ICD-10-CM | POA: Diagnosis not present

## 2021-10-05 DIAGNOSIS — J449 Chronic obstructive pulmonary disease, unspecified: Secondary | ICD-10-CM | POA: Diagnosis not present

## 2021-10-05 DIAGNOSIS — Z23 Encounter for immunization: Secondary | ICD-10-CM | POA: Diagnosis not present

## 2021-10-05 DIAGNOSIS — I2510 Atherosclerotic heart disease of native coronary artery without angina pectoris: Secondary | ICD-10-CM | POA: Diagnosis not present

## 2021-10-05 DIAGNOSIS — J301 Allergic rhinitis due to pollen: Secondary | ICD-10-CM | POA: Diagnosis not present

## 2021-10-05 DIAGNOSIS — F17210 Nicotine dependence, cigarettes, uncomplicated: Secondary | ICD-10-CM | POA: Diagnosis not present

## 2021-10-05 DIAGNOSIS — I10 Essential (primary) hypertension: Secondary | ICD-10-CM | POA: Diagnosis not present

## 2021-10-05 DIAGNOSIS — E790 Hyperuricemia without signs of inflammatory arthritis and tophaceous disease: Secondary | ICD-10-CM | POA: Diagnosis not present

## 2021-10-05 DIAGNOSIS — I340 Nonrheumatic mitral (valve) insufficiency: Secondary | ICD-10-CM | POA: Diagnosis not present

## 2021-10-05 DIAGNOSIS — K219 Gastro-esophageal reflux disease without esophagitis: Secondary | ICD-10-CM | POA: Diagnosis not present

## 2021-10-05 DIAGNOSIS — E782 Mixed hyperlipidemia: Secondary | ICD-10-CM | POA: Diagnosis not present

## 2021-10-05 DIAGNOSIS — J019 Acute sinusitis, unspecified: Secondary | ICD-10-CM | POA: Diagnosis not present

## 2021-10-06 ENCOUNTER — Other Ambulatory Visit (HOSPITAL_COMMUNITY): Payer: Self-pay

## 2021-10-06 ENCOUNTER — Inpatient Hospital Stay: Payer: Medicare HMO

## 2021-10-06 ENCOUNTER — Other Ambulatory Visit: Payer: Self-pay

## 2021-10-06 ENCOUNTER — Inpatient Hospital Stay (HOSPITAL_BASED_OUTPATIENT_CLINIC_OR_DEPARTMENT_OTHER): Payer: Medicare HMO | Admitting: Oncology

## 2021-10-06 ENCOUNTER — Other Ambulatory Visit: Payer: Self-pay | Admitting: Pharmacist

## 2021-10-06 VITALS — BP 119/58 | HR 66 | Temp 97.8°F | Resp 16 | Ht 67.0 in | Wt 145.5 lb

## 2021-10-06 DIAGNOSIS — Z452 Encounter for adjustment and management of vascular access device: Secondary | ICD-10-CM | POA: Diagnosis not present

## 2021-10-06 DIAGNOSIS — C189 Malignant neoplasm of colon, unspecified: Secondary | ICD-10-CM

## 2021-10-06 DIAGNOSIS — Z5112 Encounter for antineoplastic immunotherapy: Secondary | ICD-10-CM | POA: Diagnosis not present

## 2021-10-06 DIAGNOSIS — C187 Malignant neoplasm of sigmoid colon: Secondary | ICD-10-CM | POA: Diagnosis not present

## 2021-10-06 DIAGNOSIS — Z5111 Encounter for antineoplastic chemotherapy: Secondary | ICD-10-CM | POA: Diagnosis not present

## 2021-10-06 DIAGNOSIS — C786 Secondary malignant neoplasm of retroperitoneum and peritoneum: Secondary | ICD-10-CM | POA: Diagnosis not present

## 2021-10-06 DIAGNOSIS — T451X5A Adverse effect of antineoplastic and immunosuppressive drugs, initial encounter: Secondary | ICD-10-CM

## 2021-10-06 DIAGNOSIS — D6959 Other secondary thrombocytopenia: Secondary | ICD-10-CM | POA: Diagnosis not present

## 2021-10-06 DIAGNOSIS — Z95828 Presence of other vascular implants and grafts: Secondary | ICD-10-CM

## 2021-10-06 DIAGNOSIS — C772 Secondary and unspecified malignant neoplasm of intra-abdominal lymph nodes: Secondary | ICD-10-CM | POA: Diagnosis not present

## 2021-10-06 DIAGNOSIS — E876 Hypokalemia: Secondary | ICD-10-CM | POA: Diagnosis not present

## 2021-10-06 LAB — CBC WITH DIFFERENTIAL/PLATELET
Abs Immature Granulocytes: 0.03 10*3/uL (ref 0.00–0.07)
Basophils Absolute: 0 10*3/uL (ref 0.0–0.1)
Basophils Relative: 1 %
Eosinophils Absolute: 0.1 10*3/uL (ref 0.0–0.5)
Eosinophils Relative: 2 %
HCT: 36.5 % — ABNORMAL LOW (ref 39.0–52.0)
Hemoglobin: 12.2 g/dL — ABNORMAL LOW (ref 13.0–17.0)
Immature Granulocytes: 1 %
Lymphocytes Relative: 29 %
Lymphs Abs: 1.4 10*3/uL (ref 0.7–4.0)
MCH: 32.5 pg (ref 26.0–34.0)
MCHC: 33.4 g/dL (ref 30.0–36.0)
MCV: 97.3 fL (ref 80.0–100.0)
Monocytes Absolute: 0.6 10*3/uL (ref 0.1–1.0)
Monocytes Relative: 12 %
Neutro Abs: 2.7 10*3/uL (ref 1.7–7.7)
Neutrophils Relative %: 55 %
Platelets: 72 10*3/uL — ABNORMAL LOW (ref 150–400)
RBC: 3.75 MIL/uL — ABNORMAL LOW (ref 4.22–5.81)
RDW: 22.9 % — ABNORMAL HIGH (ref 11.5–15.5)
WBC: 4.8 10*3/uL (ref 4.0–10.5)
nRBC: 0 % (ref 0.0–0.2)

## 2021-10-06 LAB — COMPREHENSIVE METABOLIC PANEL
ALT: 22 U/L (ref 0–44)
AST: 35 U/L (ref 15–41)
Albumin: 3.6 g/dL (ref 3.5–5.0)
Alkaline Phosphatase: 84 U/L (ref 38–126)
Anion gap: 6 (ref 5–15)
BUN: 23 mg/dL (ref 8–23)
CO2: 25 mmol/L (ref 22–32)
Calcium: 8.7 mg/dL — ABNORMAL LOW (ref 8.9–10.3)
Chloride: 104 mmol/L (ref 98–111)
Creatinine, Ser: 0.83 mg/dL (ref 0.61–1.24)
GFR, Estimated: >60 mL/min (ref >60)
Glucose, Bld: 93 mg/dL (ref 70–99)
Potassium: 3.7 mmol/L (ref 3.5–5.1)
Sodium: 135 mmol/L (ref 135–145)
Total Bilirubin: 1 mg/dL (ref 0.3–1.2)
Total Protein: 6.8 g/dL (ref 6.5–8.1)

## 2021-10-06 LAB — MAGNESIUM: Magnesium: 1.7 mg/dL (ref 1.7–2.4)

## 2021-10-06 MED ORDER — CAPECITABINE 500 MG PO TABS
500.0000 mg | ORAL_TABLET | Freq: Two times a day (BID) | ORAL | 0 refills | Status: DC
  Filled 2021-10-06 – 2021-11-02 (×2): qty 28, 14d supply, fill #0

## 2021-10-06 MED ORDER — SODIUM CHLORIDE 0.9% FLUSH
10.0000 mL | Freq: Once | INTRAVENOUS | Status: AC
  Administered 2021-10-06: 10 mL via INTRAVENOUS
  Filled 2021-10-06: qty 10

## 2021-10-06 MED ORDER — HEPARIN SOD (PORK) LOCK FLUSH 100 UNIT/ML IV SOLN
500.0000 [IU] | Freq: Once | INTRAVENOUS | Status: DC
  Administered 2021-10-06: 500 [IU] via INTRAVENOUS
  Filled 2021-10-06: qty 5

## 2021-10-06 NOTE — Progress Notes (Signed)
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 He currently smokes 0.5ppd. he presented to the ER with symptoms of left sided chest pain and left arm pain. Troponin was negative ekg was unremarkable. Ct chest showed no PE. He was incidentally noted to have mediastinal and retrocrural adenopathy and a 2.1X2.3X4.4 cm retroaortic soft tissue lesion in the posterior left chest. 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  Low-grade activity in the left lateral fifth and sixth ribs associated with nondisplaced fractures likely reflecting healing response problem malignancy.   Patient underwent CT-guided biopsy of the retroperitoneal lymph node pathology showed metastatic adenocarcinoma compatible with colorectal origin.  CK7 negative.  CK20 positive.  CDX 2+.  TTF-1 negative.  PSA negative.  This pattern of immunoreactivity supports the above diagnosis. Patient underwent colonoscopy which showed sigmoid mass that was consistent with adenocarcinoma.  RAS panel testing showed that he was wild-type for both K-ras and BRAF   FOLFOX and bevacizumab chemotherapy started in July 2020.  Subsequently oxaliplatin has been on hold given neuropathy.   Recent scans have shown slow increase in the size of intra-abdominal adenopathy but patient continues to have low-volume disease.plan is to switch him to second line Xeloda irinotecan cetuximab regimen      Interval history-patient reports doing well and denies any specific complaints at this time.  Energy levels are better and appetite is improving.  He continues to gain weight.  Denies any rectal bleeding  ECOG PS- 1 Pain scale- 0  Review of systems- Review of Systems  Constitutional:  Negative for chills, fever, malaise/fatigue and weight loss.  HENT:  Negative for congestion, ear discharge and nosebleeds.   Eyes:  Negative for blurred vision.  Respiratory:  Negative for cough, hemoptysis, sputum production, shortness of breath and wheezing.   Cardiovascular:  Negative for chest pain, palpitations, orthopnea and claudication.  Gastrointestinal:  Negative for abdominal pain, blood in stool, constipation, diarrhea, heartburn, melena, nausea and vomiting.  Genitourinary:  Negative for dysuria, flank pain, frequency, hematuria and urgency.  Musculoskeletal:  Negative for back pain, joint pain and myalgias.  Skin:  Negative for rash.  Neurological:  Negative for dizziness, tingling, focal weakness, seizures, weakness and headaches.  Endo/Heme/Allergies:  Does not bruise/bleed easily.  Psychiatric/Behavioral:  Negative for depression and suicidal ideas. The patient does not have insomnia.     No Known Allergies   Past Medical History:  Diagnosis Date   Colon cancer ([place])    COPD (chronic obstructive pulmonary disease) ([place])    Hyperlipidemia      Past Surgical History:  Procedure Laterality Date   COLONOSCOPY WITH PROPOFOL N/A 06/26/2019   Procedure: COLONOSCOPY WITH PROPOFOL;  Surgeon: [name], MD;  Location: [facility] ENDOSCOPY;  Service: Gastroenterology;  Laterality: N/A;   FLEXIBLE SIGMOIDOSCOPY N/A 05/07/2021  Procedure: FLEXIBLE SIGMOIDOSCOPY;  Surgeon: [name], MD;   Location: [facility] ENDOSCOPY;  Service: Gastroenterology;  Laterality: N/A;   PORTA CATH INSERTION N/A 06/25/2019   Procedure: PORTA CATH INSERTION;  Surgeon: Algernon Huxley, MD;  Location: [place] CV LAB;  Service: Cardiovascular;  Laterality: N/A;   PORTA CATH INSERTION Left 08/11/2020   Procedure: PORTA CATH INSERTION;  Surgeon: Algernon Huxley, MD;  Location: [place] CV LAB;  Service: Cardiovascular;  Laterality: Left;   PORTA CATH REMOVAL Right 08/11/2020   Procedure: PORTA CATH REMOVAL;  Surgeon: Algernon Huxley, MD;  Location: [place] CV LAB;  Service: Cardiovascular;  Laterality: Right;    Social History   Socioeconomic History   Marital status: Single    Spouse name: Not on file   Number of children: Not on file   Years of education: Not on file   Highest education level: Not on file  Occupational History   Not on file  Tobacco Use   Smoking status: Every Day    Packs/day: 0.50    Years: 40.00    Pack years: 20.00    Types: Cigarettes   Smokeless tobacco: Never   Tobacco comments:    smoking 40 years  Vaping Use   Vaping Use: Never used  Substance and Sexual Activity   Alcohol use: No   Drug use: No   Sexual activity: Not Currently  Other Topics Concern   Not on file  Social History Narrative   Not on file   Social Determinants of Health   Financial Resource Strain: Not on file  Food Insecurity: Not on file  Transportation Needs: Not on file  Physical Activity: Not on file  Stress: Not on file  Social Connections: Not on file  Intimate Partner Violence: Not on file    Family History  Problem Relation Age of Onset   Brain cancer Father      Current Outpatient Medications:    allopurinol (ZYLOPRIM) 100 MG tablet, , Disp: , Rfl:    aspirin EC 81 MG tablet, Take 81 mg by mouth daily., Disp: , Rfl:    azelastine (ASTELIN) 0.1 % nasal spray, Place 1 spray into both nostrils 1 day or 1 dose., Disp: , Rfl:    butalbital-acetaminophen-caffeine (FIORICET)  50-325-40 MG tablet, Take 1 tablet by mouth every 4 (four) hours as needed., Disp: , Rfl:    cetirizine (ZYRTEC) 10 MG tablet, Take 1 tablet (10 mg total) by mouth daily., Disp: 30 tablet, Rfl: 0   chlorthalidone (HYGROTON) 25 MG tablet, Take 1 tablet by mouth daily., Disp: , Rfl:    DULoxetine (CYMBALTA) 30 MG capsule, Take 1 capsule (30 mg total) by mouth 2 (two) times daily., Disp: 60 capsule, Rfl: 1   FAMOTIDINE PO, Take by mouth. Take as needed., Disp: , Rfl:    fluticasone (FLONASE) 50 MCG/ACT nasal spray, Place 2 sprays into both nostrils 1 day or 1 dose. , Disp: , Rfl:    fluticasone (VERAMYST) 27.5 MCG/SPRAY nasal spray, Place 2 sprays into the nose daily., Disp: , Rfl:    loperamide (IMODIUM A-D) 2 MG tablet, Take 1 tablet (2 mg total) by mouth 4 (four) times daily as needed. Take 2 at diarrhea onset , then 1 every 2hr until 12hrs with no BM. May take 2 every 4hrs at night. If diarrhea recurs repeat., Disp: 100 tablet, Rfl: 1   montelukast (SINGULAIR) 10 MG tablet, Take 10 mg by mouth at bedtime., Disp: , Rfl:  NEXLETOL 180 MG TABS, Take 1 tablet by mouth daily., Disp: , Rfl:    oxyCODONE (OXY IR/ROXICODONE) 5 MG immediate release tablet, Take 1 tablet (5 mg total) by mouth every 6 (six) hours as needed for severe pain., Disp: 30 tablet, Rfl: 0   potassium chloride SA (KLOR-CON) 20 MEQ tablet, Take 1 tablet (20 mEq total) by mouth daily., Disp: 30 tablet, Rfl: 0   pregabalin (LYRICA) 75 MG capsule, Take 1 capsule (75 mg total) by mouth 2 (two) times daily., Disp: 60 capsule, Rfl: 2   simvastatin (ZOCOR) 20 MG tablet, Take 20 mg by mouth daily. , Disp: , Rfl:    SYMBICORT 80-4.5 MCG/ACT inhaler, Inhale 2 puffs into the lungs 1 day or 1 dose. , Disp: , Rfl:    tamsulosin (FLOMAX) 0.4 MG CAPS capsule, Take 0.4 mg by mouth. , Disp: , Rfl:    capecitabine (XELODA) 500 MG tablet, Take 1 tablet (500 mg total) by mouth 2 (two) times daily after a meal. Take for 14 days, then hold for 7 days.  Repeat every 21 days., Disp: 28 tablet, Rfl: 0   magic mouthwash (multi-ingredient) oral suspension, Swish and spit 5-10 mls by mouth 4 times a day as needed (Patient not taking: Reported on 10/06/2021), Disp: 480 mL, Rfl: 3   magic mouthwash w/lidocaine SOLN, Take 5-10 mLs by mouth 4 (four) times daily as needed for mouth pain. (Patient not taking: Reported on 10/06/2021), Disp: 480 mL, Rfl: 3   nicotine (NICODERM CQ - DOSED IN MG/24 HOURS) 21 mg/24hr patch, Place 1 patch onto the skin 1 day or 1 dose. (Patient not taking: Reported on 10/06/2021), Disp: , Rfl:   Physical exam:  Vitals:   10/06/21 0855  BP: (!) 119/58  Pulse: 66  Resp: 16  Temp: 97.8 F (36.6 C)  TempSrc: Oral  Weight: 145 lb 8 oz (66 kg)  Height: '5\' 7"'  (1.702 m)   Physical Exam Constitutional:      General: He is not in acute distress. Cardiovascular:     Rate and Rhythm: Normal rate and regular rhythm.     Heart sounds: Normal heart sounds.  Pulmonary:     Effort: Pulmonary effort is normal.     Breath sounds: Normal breath sounds.  Skin:    General: Skin is warm and dry.  Neurological:     Mental Status: He is alert and oriented to person, place, and time.     CMP Latest Ref Rng & Units 10/06/2021  Glucose 70 - 99 mg/dL 93  BUN 8 - 23 mg/dL 23  Creatinine 0.61 - 1.24 mg/dL 0.83  Sodium 135 - 145 mmol/L 135  Potassium 3.5 - 5.1 mmol/L 3.7  Chloride 98 - 111 mmol/L 104  CO2 22 - 32 mmol/L 25  Calcium 8.9 - 10.3 mg/dL 8.7(L)  Total Protein 6.5 - 8.1 g/dL 6.8  Total Bilirubin 0.3 - 1.2 mg/dL 1.0  Alkaline Phos 38 - 126 U/L 84  AST 15 - 41 U/L 35  ALT 0 - 44 U/L 22   CBC Latest Ref Rng & Units 10/06/2021  WBC 4.0 - 10.5 K/uL 4.8  Hemoglobin 13.0 - 17.0 g/dL 12.2(L)  Hematocrit 39.0 - 52.0 % 36.5(L)  Platelets 150 - 400 K/uL 72(L)      Assessment and plan- Patient is a 70 y.o. male with metastatic K-ras wild-type colon cancer with intra-abdominal lymph node metastases.  He is here for on treatment  assessment prior to cycle 4 of panitumumab  irinotecan chemotherapy  Platelet count is 72 today.  I am therefore holding off on chemotherapy at this time.  I will see him back in 2 weeks and if platelet counts are more than 75 consistently we will restart treatment.  He would also hold off on taking Xeloda at this time and restart in 2 weeks at a lower dose of 500 mg twice daily instead of 1000 mg twice daily.  He is already receiving irinotecan at a reduced dose of 125 mg per metered square.  If thrombocytopenia continues to be a problem I will continue with Xeloda plus panitumumab alone.  Overall CEA is trending down and patient symptomatically feels better.  Plan to repeat scans next month   Visit Diagnosis 1. 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 ([place])   2. Chemotherapy-induced thrombocytopenia      Dr. Randa Evens, MD, MPH [facility] [phone] 10/06/2021 12:47 PM

## 2021-10-06 NOTE — Progress Notes (Signed)
Pt states he has been having HA and his PCP gave him fioricet. He has not picked up now.

## 2021-10-07 LAB — CEA: CEA: 3.9 ng/mL (ref 0.0–4.7)

## 2021-10-13 ENCOUNTER — Other Ambulatory Visit: Payer: Self-pay | Admitting: Oncology

## 2021-10-15 ENCOUNTER — Other Ambulatory Visit (HOSPITAL_COMMUNITY): Payer: Self-pay

## 2021-10-19 ENCOUNTER — Other Ambulatory Visit: Payer: Self-pay

## 2021-10-19 ENCOUNTER — Inpatient Hospital Stay: Payer: Medicare HMO | Attending: Oncology

## 2021-10-19 ENCOUNTER — Inpatient Hospital Stay (HOSPITAL_BASED_OUTPATIENT_CLINIC_OR_DEPARTMENT_OTHER): Payer: Medicare HMO | Admitting: Oncology

## 2021-10-19 ENCOUNTER — Encounter: Payer: Self-pay | Admitting: Oncology

## 2021-10-19 ENCOUNTER — Inpatient Hospital Stay: Payer: Medicare HMO

## 2021-10-19 VITALS — BP 107/65 | HR 73 | Temp 96.8°F | Resp 18 | Wt 144.8 lb

## 2021-10-19 DIAGNOSIS — C189 Malignant neoplasm of colon, unspecified: Secondary | ICD-10-CM | POA: Diagnosis not present

## 2021-10-19 DIAGNOSIS — Z5111 Encounter for antineoplastic chemotherapy: Secondary | ICD-10-CM

## 2021-10-19 DIAGNOSIS — Z79899 Other long term (current) drug therapy: Secondary | ICD-10-CM

## 2021-10-19 DIAGNOSIS — C187 Malignant neoplasm of sigmoid colon: Secondary | ICD-10-CM | POA: Insufficient documentation

## 2021-10-19 DIAGNOSIS — C772 Secondary and unspecified malignant neoplasm of intra-abdominal lymph nodes: Secondary | ICD-10-CM

## 2021-10-19 DIAGNOSIS — Z5112 Encounter for antineoplastic immunotherapy: Secondary | ICD-10-CM | POA: Diagnosis not present

## 2021-10-19 DIAGNOSIS — C786 Secondary malignant neoplasm of retroperitoneum and peritoneum: Secondary | ICD-10-CM | POA: Diagnosis not present

## 2021-10-19 DIAGNOSIS — T451X5A Adverse effect of antineoplastic and immunosuppressive drugs, initial encounter: Secondary | ICD-10-CM

## 2021-10-19 DIAGNOSIS — D6959 Other secondary thrombocytopenia: Secondary | ICD-10-CM | POA: Diagnosis not present

## 2021-10-19 LAB — CBC WITH DIFFERENTIAL/PLATELET
Abs Immature Granulocytes: 0.05 10*3/uL (ref 0.00–0.07)
Basophils Absolute: 0.1 10*3/uL (ref 0.0–0.1)
Basophils Relative: 1 %
Eosinophils Absolute: 0.2 10*3/uL (ref 0.0–0.5)
Eosinophils Relative: 3 %
HCT: 39.1 % (ref 39.0–52.0)
Hemoglobin: 13.4 g/dL (ref 13.0–17.0)
Immature Granulocytes: 1 %
Lymphocytes Relative: 29 %
Lymphs Abs: 1.7 10*3/uL (ref 0.7–4.0)
MCH: 33.2 pg (ref 26.0–34.0)
MCHC: 34.3 g/dL (ref 30.0–36.0)
MCV: 96.8 fL (ref 80.0–100.0)
Monocytes Absolute: 0.6 10*3/uL (ref 0.1–1.0)
Monocytes Relative: 9 %
Neutro Abs: 3.4 10*3/uL (ref 1.7–7.7)
Neutrophils Relative %: 57 %
Platelets: 110 10*3/uL — ABNORMAL LOW (ref 150–400)
RBC: 4.04 MIL/uL — ABNORMAL LOW (ref 4.22–5.81)
RDW: 19.3 % — ABNORMAL HIGH (ref 11.5–15.5)
WBC: 5.9 10*3/uL (ref 4.0–10.5)
nRBC: 0 % (ref 0.0–0.2)

## 2021-10-19 LAB — COMPREHENSIVE METABOLIC PANEL
ALT: 27 U/L (ref 0–44)
AST: 42 U/L — ABNORMAL HIGH (ref 15–41)
Albumin: 4 g/dL (ref 3.5–5.0)
Alkaline Phosphatase: 110 U/L (ref 38–126)
Anion gap: 8 (ref 5–15)
BUN: 29 mg/dL — ABNORMAL HIGH (ref 8–23)
CO2: 24 mmol/L (ref 22–32)
Calcium: 9 mg/dL (ref 8.9–10.3)
Chloride: 103 mmol/L (ref 98–111)
Creatinine, Ser: 1.08 mg/dL (ref 0.61–1.24)
GFR, Estimated: >60 mL/min (ref >60)
Glucose, Bld: 103 mg/dL — ABNORMAL HIGH (ref 70–99)
Potassium: 4.1 mmol/L (ref 3.5–5.1)
Sodium: 135 mmol/L (ref 135–145)
Total Bilirubin: 0.8 mg/dL (ref 0.3–1.2)
Total Protein: 7.8 g/dL (ref 6.5–8.1)

## 2021-10-19 LAB — MAGNESIUM: Magnesium: 2.1 mg/dL (ref 1.7–2.4)

## 2021-10-19 MED ORDER — HEPARIN SOD (PORK) LOCK FLUSH 100 UNIT/ML IV SOLN
INTRAVENOUS | Status: AC
  Administered 2021-10-19: 500 [IU] via INTRAVENOUS
  Filled 2021-10-19: qty 5

## 2021-10-19 MED ORDER — PALONOSETRON HCL INJECTION 0.25 MG/5ML
0.2500 mg | Freq: Once | INTRAVENOUS | Status: AC
  Administered 2021-10-19: 0.25 mg via INTRAVENOUS
  Filled 2021-10-19: qty 5

## 2021-10-19 MED ORDER — SODIUM CHLORIDE 0.9 % IV SOLN
10.0000 mg | Freq: Once | INTRAVENOUS | Status: AC
  Administered 2021-10-19: 10 mg via INTRAVENOUS
  Filled 2021-10-19: qty 10

## 2021-10-19 MED ORDER — ATROPINE SULFATE 1 MG/ML IV SOLN
0.5000 mg | Freq: Once | INTRAVENOUS | Status: AC | PRN
  Administered 2021-10-19: 0.5 mg via INTRAVENOUS
  Filled 2021-10-19: qty 1

## 2021-10-19 MED ORDER — SODIUM CHLORIDE 0.9% FLUSH
10.0000 mL | INTRAVENOUS | Status: DC | PRN
  Administered 2021-10-19: 10 mL via INTRAVENOUS
  Filled 2021-10-19: qty 10

## 2021-10-19 MED ORDER — SODIUM CHLORIDE 0.9 % IV SOLN
6.0000 mg/kg | Freq: Once | INTRAVENOUS | Status: AC
  Administered 2021-10-19: 400 mg via INTRAVENOUS
  Filled 2021-10-19: qty 20

## 2021-10-19 MED ORDER — SODIUM CHLORIDE 0.9 % IV SOLN
Freq: Once | INTRAVENOUS | Status: AC
  Filled 2021-10-19: qty 250

## 2021-10-19 MED ORDER — HEPARIN SOD (PORK) LOCK FLUSH 100 UNIT/ML IV SOLN
500.0000 [IU] | Freq: Once | INTRAVENOUS | Status: AC
  Filled 2021-10-19: qty 5

## 2021-10-19 MED ORDER — HEPARIN SOD (PORK) LOCK FLUSH 100 UNIT/ML IV SOLN
500.0000 [IU] | Freq: Once | INTRAVENOUS | Status: DC | PRN
  Filled 2021-10-19: qty 5

## 2021-10-19 MED ORDER — SODIUM CHLORIDE 0.9 % IV SOLN
125.0000 mg/m2 | Freq: Once | INTRAVENOUS | Status: AC
  Administered 2021-10-19: 220 mg via INTRAVENOUS
  Filled 2021-10-19: qty 10

## 2021-10-19 NOTE — Progress Notes (Signed)
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 He currently smokes 0.5ppd. he presented to the ER with symptoms of left sided chest pain and left arm pain. Troponin was negative ekg was unremarkable. Ct chest showed no PE. He was incidentally noted to have mediastinal and retrocrural adenopathy and a 2.1X2.3X4.4 cm retroaortic soft tissue lesion in the posterior left chest. 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  Low-grade activity in the left lateral fifth and sixth ribs associated with nondisplaced fractures likely reflecting healing response problem malignancy.   Patient underwent CT-guided biopsy of the retroperitoneal lymph node pathology showed metastatic adenocarcinoma compatible with colorectal origin.  CK7 negative.  CK20 positive.  CDX 2+.  TTF-1 negative.  PSA negative.  This pattern of immunoreactivity supports the above diagnosis. Patient underwent colonoscopy which showed sigmoid mass that was consistent with adenocarcinoma.  RAS panel testing showed that he was wild-type for both K-ras and BRAF   FOLFOX and bevacizumab chemotherapy started in July 2020.  Subsequently oxaliplatin has been on hold given neuropathy.   Recent scans have shown slow increase in the size of intra-abdominal adenopathy but patient continues to have low-volume disease.plan is to switch him to second line Xeloda irinotecan panitumumab  regimen    Interval history-overall patient is tolerating treatment well and denies any [place] complaints at this time.  Bowel movements are regular.  Denies any rectal bleeding.  Appetite and weight have remained stable.  Denies any skin rash.  Neuropathy overall stable  ECOG PS- 1 Pain scale- 0 Opioid associated constipation- no  Review of systems- Review of Systems  Constitutional:  Negative for chills, fever, malaise/fatigue and weight loss.  HENT:  Negative for congestion, ear discharge and nosebleeds.   Eyes:  Negative for blurred vision.  Respiratory:  Negative for cough, hemoptysis, sputum production, shortness of breath and wheezing.   Cardiovascular:  Negative for chest pain, palpitations, orthopnea and claudication.  Gastrointestinal:  Negative for abdominal pain, blood in stool, constipation, diarrhea, heartburn, melena, nausea and vomiting.  Genitourinary:  Negative for dysuria, flank pain, frequency, hematuria and urgency.  Musculoskeletal:  Negative for back pain, joint pain and myalgias.  Skin:  Negative for rash.  Neurological:  Negative for dizziness, tingling, focal weakness, seizures, weakness and headaches.  Endo/Heme/Allergies:  Does not bruise/bleed easily.  Psychiatric/Behavioral:  Negative for depression and suicidal ideas. The patient does not have insomnia.      No Known Allergies   Past Medical History:  Diagnosis Date   Colon cancer ([place])    COPD (chronic obstructive pulmonary disease) ([place])    Hyperlipidemia      Past Surgical History:  Procedure Laterality Date   COLONOSCOPY WITH PROPOFOL N/A 06/26/2019   Procedure: COLONOSCOPY WITH PROPOFOL;  Surgeon: [name], MD;  Location: [facility] ENDOSCOPY;  Service: Gastroenterology;  Laterality: N/A;   FLEXIBLE  SIGMOIDOSCOPY N/A 05/07/2021   Procedure: FLEXIBLE SIGMOIDOSCOPY;  Surgeon: [name], MD;  Location: [facility] ENDOSCOPY;  Service: Gastroenterology;  Laterality: N/A;   PORTA CATH INSERTION N/A 06/25/2019   Procedure: PORTA CATH INSERTION;  Surgeon: Algernon Huxley, MD;  Location: [place] CV LAB;  Service: Cardiovascular;  Laterality: N/A;   PORTA CATH INSERTION Left 08/11/2020   Procedure: PORTA CATH INSERTION;  Surgeon: Algernon Huxley, MD;  Location: [place] CV LAB;  Service: Cardiovascular;  Laterality: Left;   PORTA CATH REMOVAL Right 08/11/2020   Procedure: PORTA CATH REMOVAL;  Surgeon: Algernon Huxley, MD;  Location: [place] CV LAB;  Service: Cardiovascular;  Laterality: Right;    Social History   Socioeconomic History   Marital status: Single    Spouse name: Not on file   Number of children: Not on file   Years of education: Not on file   Highest education level: Not on file  Occupational History   Not on file  Tobacco Use   Smoking status: Every Day    Packs/day: 0.50    Years: 40.00    Pack years: 20.00    Types: Cigarettes   Smokeless tobacco: Never   Tobacco comments:    smoking 40 years  Vaping Use   Vaping Use: Never used  Substance and Sexual Activity   Alcohol use: No   Drug use: No   Sexual activity: Not Currently  Other Topics Concern   Not on file  Social History Narrative   Not on file   Social Determinants of Health   Financial Resource Strain: Not on file  Food Insecurity: Not on file  Transportation Needs: Not on file  Physical Activity: Not on file  Stress: Not on file  Social Connections: Not on file  Intimate Partner Violence: Not on file    Family History  Problem Relation Age of Onset   Brain cancer Father      Current Outpatient Medications:    allopurinol (ZYLOPRIM) 100 MG tablet, , Disp: , Rfl:    aspirin EC 81 MG tablet, Take 81 mg by mouth daily., Disp: , Rfl:    azelastine (ASTELIN) 0.1 % nasal spray, Place 1 spray into  both nostrils 1 day or 1 dose., Disp: , Rfl:    butalbital-acetaminophen-caffeine (FIORICET) 50-325-40 MG tablet, Take 1 tablet by mouth every 4 (four) hours as needed., Disp: , Rfl:    capecitabine (XELODA) 500 MG tablet, Take 1 tablet (500 mg total) by mouth 2 (two) times daily after a meal. Take for 14 days, then hold for 7 days. Repeat every 21 days., Disp: 28 tablet, Rfl: 0   cetirizine (ZYRTEC) 10 MG tablet, Take 1 tablet (10 mg total) by mouth daily., Disp: 30 tablet, Rfl: 0   chlorthalidone (HYGROTON) 25 MG tablet, Take 1 tablet by mouth daily., Disp: , Rfl:    DULoxetine (CYMBALTA) 30 MG capsule, Take 1 capsule (30 mg total) by mouth 2 (two) times daily., Disp: 60 capsule, Rfl: 1   FAMOTIDINE PO, Take by mouth. Take as needed., Disp: , Rfl:    fluticasone (FLONASE) 50 MCG/ACT nasal spray, Place 2 sprays into both nostrils 1 day or 1 dose. , Disp: , Rfl:    fluticasone (VERAMYST) 27.5 MCG/SPRAY nasal spray, Place 2 sprays into the nose daily., Disp: , Rfl:    loperamide (IMODIUM A-D) 2 MG tablet, Take 1 tablet (2 mg total) by mouth 4 (four) times daily as needed.  Take 2 at diarrhea onset , then 1 every 2hr until 12hrs with no BM. May take 2 every 4hrs at night. If diarrhea recurs repeat., Disp: 100 tablet, Rfl: 1   magic mouthwash (multi-ingredient) oral suspension, Swish and spit 5-10 mls by mouth 4 times a day as needed (Patient not taking: Reported on 10/06/2021), Disp: 480 mL, Rfl: 3   magic mouthwash w/lidocaine SOLN, Take 5-10 mLs by mouth 4 (four) times daily as needed for mouth pain. (Patient not taking: Reported on 10/06/2021), Disp: 480 mL, Rfl: 3   montelukast (SINGULAIR) 10 MG tablet, Take 10 mg by mouth at bedtime., Disp: , Rfl:    NEXLETOL 180 MG TABS, Take 1 tablet by mouth daily., Disp: , Rfl:    nicotine (NICODERM CQ - DOSED IN MG/24 HOURS) 21 mg/24hr patch, Place 1 patch onto the skin 1 day or 1 dose. (Patient not taking: Reported on 10/06/2021), Disp: , Rfl:    oxyCODONE  (OXY IR/ROXICODONE) 5 MG immediate release tablet, Take 1 tablet (5 mg total) by mouth every 6 (six) hours as needed for severe pain., Disp: 30 tablet, Rfl: 0   potassium chloride SA (KLOR-CON) 20 MEQ tablet, Take 1 tablet (20 mEq total) by mouth daily., Disp: 30 tablet, Rfl: 0   pregabalin (LYRICA) 75 MG capsule, Take 1 capsule (75 mg total) by mouth 2 (two) times daily., Disp: 60 capsule, Rfl: 2   simvastatin (ZOCOR) 20 MG tablet, Take 20 mg by mouth daily. , Disp: , Rfl:    SYMBICORT 80-4.5 MCG/ACT inhaler, Inhale 2 puffs into the lungs 1 day or 1 dose. , Disp: , Rfl:    tamsulosin (FLOMAX) 0.4 MG CAPS capsule, Take 0.4 mg by mouth. , Disp: , Rfl:  No current facility-administered medications for this visit.  Facility-Administered Medications Ordered in Other Visits:    heparin lock flush 100 unit/mL, 500 Units, Intravenous, Once, Sindy Guadeloupe, MD   sodium chloride flush (NS) 0.9 % injection 10 mL, 10 mL, Intravenous, PRN, Sindy Guadeloupe, MD  Physical exam:  Vitals:   10/19/21 0838  BP: 107/65  Pulse: 73  Resp: 18  Temp: (!) 96.8 F (36 C)  SpO2: 99%  Weight: 144 lb 12.8 oz (65.7 kg)   Physical Exam HENT:     Head: Normocephalic and atraumatic.  Eyes:     Pupils: Pupils are equal, round, and reactive to light.  Cardiovascular:     Rate and Rhythm: Normal rate and regular rhythm.     Heart sounds: Normal heart sounds.  Pulmonary:     Effort: Pulmonary effort is normal.     Breath sounds: Normal breath sounds.  Abdominal:     General: Bowel sounds are normal.     Palpations: Abdomen is soft.  Musculoskeletal:     Cervical back: Normal range of motion.  Skin:    General: Skin is warm and dry.  Neurological:     Mental Status: He is alert and oriented to person, place, and time.     CMP Latest Ref Rng & Units 10/06/2021  Glucose 70 - 99 mg/dL 93  BUN 8 - 23 mg/dL 23  Creatinine 0.61 - 1.24 mg/dL 0.83  Sodium 135 - 145 mmol/L 135  Potassium 3.5 - 5.1 mmol/L 3.7   Chloride 98 - 111 mmol/L 104  CO2 22 - 32 mmol/L 25  Calcium 8.9 - 10.3 mg/dL 8.7(L)  Total Protein 6.5 - 8.1 g/dL 6.8  Total Bilirubin 0.3 - 1.2 mg/dL 1.0  Alkaline Phos 38 - 126 U/L 84  AST 15 - 41 U/L 35  ALT 0 - 44 U/L 22   CBC Latest Ref Rng & Units 10/19/2021  WBC 4.0 - 10.5 K/uL 5.9  Hemoglobin 13.0 - 17.0 g/dL 13.4  Hematocrit 39.0 - 52.0 % 39.1  Platelets 150 - 400 K/uL 110(L)     Assessment and plan- Patient is a 70 y.o. male with metastatic K-ras wild-type colon cancer with intra-abdominal lymph node metastases.  He is here for on treatment assessment prior to cycle 5 of panitumumab irinotecan chemotherapy  Treatment was on hold for 3 weeks given his last platelet count was 72.  I do plan to restart chemotherapy at this time with irinotecan and panitumumab.  He is already receiving irinotecan at a reduced dose of 1.5 mg per metered square.  He will be restarting Xeloda today at a lower dose of 500 mg twice daily and Xeloda 1000 mg twice daily 2 weeks on and 1 week off.  Counts otherwise okay to proceed with cycle 4 of panitumumab irinotecan chemotherapy today.  Platelet counts have improved 210/172 prior  I will see him back in 3 weeks for cycle 5.  Plan to repeat scans after 6 cycles.  CEA is trending down   Visit Diagnosis 1. Encounter for antineoplastic chemotherapy   2. High risk medication use   3. Chemotherapy-induced thrombocytopenia   4. 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 ([place])      Dr. Randa Evens, MD, MPH [facility] at [facility] [phone] 10/19/2021 8:52 AM

## 2021-10-19 NOTE — Patient Instructions (Signed)
[facility] ONCOLOGY  Discharge Instructions: Thank you for choosing [facility] to provide your oncology and hematology care.  If you have a lab appointment with the [facility], please go directly to the [facility] and check in at the registration area.  Wear comfortable clothing and clothing appropriate for easy access to any Portacath or PICC line.   We strive to give you quality time with your provider. You may need to reschedule your appointment if you arrive late (15 or more minutes).  Arriving late affects you and other patients whose appointments are after yours.  Also, if you miss three or more appointments without notifying the office, you may be dismissed from the clinic at the provider's discretion.      For prescription refill requests, have your pharmacy contact our office and allow 72 hours for refills to be completed.    Today you received the following chemotherapy and/or immunotherapy agents Vectibix and Irinotecan       To help prevent nausea and vomiting after your treatment, we encourage you to take your nausea medication as directed.  BELOW ARE SYMPTOMS THAT SHOULD BE REPORTED IMMEDIATELY: *FEVER GREATER THAN 100.4 F (38 C) OR HIGHER *CHILLS OR SWEATING *NAUSEA AND VOMITING THAT IS NOT CONTROLLED WITH YOUR NAUSEA MEDICATION *UNUSUAL SHORTNESS OF BREATH *UNUSUAL BRUISING OR BLEEDING *URINARY PROBLEMS (pain or burning when urinating, or frequent urination) *BOWEL PROBLEMS (unusual diarrhea, constipation, pain near the anus) TENDERNESS IN MOUTH AND THROAT WITH OR WITHOUT PRESENCE OF ULCERS (sore throat, sores in mouth, or a toothache) UNUSUAL RASH, SWELLING OR PAIN  UNUSUAL VAGINAL DISCHARGE OR ITCHING   Items with * indicate a potential emergency and should be followed up as soon as possible or go to the Emergency Department if any problems should occur.  Please show the CHEMOTHERAPY ALERT CARD or IMMUNOTHERAPY ALERT  CARD at check-in to the Emergency Department and triage nurse.  Should you have questions after your visit or need to cancel or reschedule your appointment, please contact [facility]  [phone] and follow the prompts.  Office hours are 8:00 a.m. to 4:30 p.m. Monday - Friday. Please note that voicemails left after 4:00 p.m. may not be returned until the following business day.  We are closed weekends and major holidays. You have access to a nurse at all times for urgent questions. Please call the main number to the clinic [phone] and follow the prompts.  For any non-urgent questions, you may also contact your provider using MyChart. We now offer e-Visits for anyone 63 and older to request care online for non-urgent symptoms. For details visit [email or web address].   Also download the MyChart app! Go to the app store, search "MyChart", open the app, select [facility], and log in with your MyChart username and password.  Due to Covid, a mask is required upon entering the hospital/clinic. If you do not have a mask, one will be given to you upon arrival. For doctor visits, patients may have 1 support person aged 55 or older with them. For treatment visits, patients cannot have anyone with them due to current Covid guidelines and our immunocompromised population.

## 2021-10-20 ENCOUNTER — Encounter: Payer: Self-pay | Admitting: Pharmacist

## 2021-10-20 LAB — CEA: CEA: 6.1 ng/mL — ABNORMAL HIGH (ref 0.0–4.7)

## 2021-10-21 ENCOUNTER — Inpatient Hospital Stay: Payer: Medicare HMO

## 2021-11-02 ENCOUNTER — Other Ambulatory Visit (HOSPITAL_COMMUNITY): Payer: Self-pay

## 2021-11-04 ENCOUNTER — Other Ambulatory Visit (HOSPITAL_COMMUNITY): Payer: Self-pay

## 2021-11-09 ENCOUNTER — Inpatient Hospital Stay: Payer: Medicare HMO | Attending: Oncology | Admitting: Oncology

## 2021-11-09 ENCOUNTER — Inpatient Hospital Stay: Payer: Medicare HMO | Attending: Oncology

## 2021-11-09 ENCOUNTER — Inpatient Hospital Stay: Payer: Medicare HMO

## 2021-11-09 ENCOUNTER — Other Ambulatory Visit: Payer: Self-pay

## 2021-11-09 ENCOUNTER — Encounter: Payer: Self-pay | Admitting: Oncology

## 2021-11-09 VITALS — BP 112/61 | HR 75 | Temp 98.0°F | Resp 16 | Ht 67.0 in | Wt 148.0 lb

## 2021-11-09 DIAGNOSIS — Z5111 Encounter for antineoplastic chemotherapy: Secondary | ICD-10-CM

## 2021-11-09 DIAGNOSIS — C772 Secondary and unspecified malignant neoplasm of intra-abdominal lymph nodes: Secondary | ICD-10-CM

## 2021-11-09 DIAGNOSIS — C189 Malignant neoplasm of colon, unspecified: Secondary | ICD-10-CM | POA: Diagnosis not present

## 2021-11-09 DIAGNOSIS — Z79899 Other long term (current) drug therapy: Secondary | ICD-10-CM

## 2021-11-09 DIAGNOSIS — Z5112 Encounter for antineoplastic immunotherapy: Secondary | ICD-10-CM | POA: Diagnosis not present

## 2021-11-09 DIAGNOSIS — D6959 Other secondary thrombocytopenia: Secondary | ICD-10-CM | POA: Diagnosis not present

## 2021-11-09 DIAGNOSIS — C187 Malignant neoplasm of sigmoid colon: Secondary | ICD-10-CM | POA: Diagnosis not present

## 2021-11-09 DIAGNOSIS — C786 Secondary malignant neoplasm of retroperitoneum and peritoneum: Secondary | ICD-10-CM | POA: Diagnosis not present

## 2021-11-09 LAB — CBC WITH DIFFERENTIAL/PLATELET
Abs Immature Granulocytes: 0.04 10*3/uL (ref 0.00–0.07)
Basophils Absolute: 0.1 10*3/uL (ref 0.0–0.1)
Basophils Relative: 1 %
Eosinophils Absolute: 0.2 10*3/uL (ref 0.0–0.5)
Eosinophils Relative: 3 %
HCT: 39.1 % (ref 39.0–52.0)
Hemoglobin: 13.6 g/dL (ref 13.0–17.0)
Immature Granulocytes: 1 %
Lymphocytes Relative: 30 %
Lymphs Abs: 1.8 10*3/uL (ref 0.7–4.0)
MCH: 33.7 pg (ref 26.0–34.0)
MCHC: 34.8 g/dL (ref 30.0–36.0)
MCV: 96.8 fL (ref 80.0–100.0)
Monocytes Absolute: 0.6 10*3/uL (ref 0.1–1.0)
Monocytes Relative: 11 %
Neutro Abs: 3.3 10*3/uL (ref 1.7–7.7)
Neutrophils Relative %: 54 %
Platelets: 88 10*3/uL — ABNORMAL LOW (ref 150–400)
RBC: 4.04 MIL/uL — ABNORMAL LOW (ref 4.22–5.81)
RDW: 16.3 % — ABNORMAL HIGH (ref 11.5–15.5)
WBC: 5.9 10*3/uL (ref 4.0–10.5)
nRBC: 0 % (ref 0.0–0.2)

## 2021-11-09 LAB — COMPREHENSIVE METABOLIC PANEL
ALT: 28 U/L (ref 0–44)
AST: 40 U/L (ref 15–41)
Albumin: 4 g/dL (ref 3.5–5.0)
Alkaline Phosphatase: 97 U/L (ref 38–126)
Anion gap: 4 — ABNORMAL LOW (ref 5–15)
BUN: 27 mg/dL — ABNORMAL HIGH (ref 8–23)
CO2: 27 mmol/L (ref 22–32)
Calcium: 9.3 mg/dL (ref 8.9–10.3)
Chloride: 105 mmol/L (ref 98–111)
Creatinine, Ser: 1.05 mg/dL (ref 0.61–1.24)
GFR, Estimated: >60 mL/min (ref >60)
Glucose, Bld: 92 mg/dL (ref 70–99)
Potassium: 4 mmol/L (ref 3.5–5.1)
Sodium: 136 mmol/L (ref 135–145)
Total Bilirubin: 0.6 mg/dL (ref 0.3–1.2)
Total Protein: 7.6 g/dL (ref 6.5–8.1)

## 2021-11-09 LAB — MAGNESIUM: Magnesium: 2.1 mg/dL (ref 1.7–2.4)

## 2021-11-09 MED ORDER — SODIUM CHLORIDE 0.9 % IV SOLN
10.0000 mg | Freq: Once | INTRAVENOUS | Status: AC
  Administered 2021-11-09: 12:00:00 10 mg via INTRAVENOUS
  Filled 2021-11-09: qty 10

## 2021-11-09 MED ORDER — SODIUM CHLORIDE 0.9 % IV SOLN
6.0000 mg/kg | Freq: Once | INTRAVENOUS | Status: AC
  Administered 2021-11-09: 12:00:00 400 mg via INTRAVENOUS
  Filled 2021-11-09: qty 20

## 2021-11-09 MED ORDER — SODIUM CHLORIDE 0.9 % IV SOLN
Freq: Once | INTRAVENOUS | Status: AC
  Filled 2021-11-09: qty 250

## 2021-11-09 MED ORDER — HEPARIN SOD (PORK) LOCK FLUSH 100 UNIT/ML IV SOLN
INTRAVENOUS | Status: AC
  Administered 2021-11-09: 14:00:00 500 [IU]
  Filled 2021-11-09: qty 5

## 2021-11-09 MED ORDER — ATROPINE SULFATE 1 MG/ML IV SOLN
0.5000 mg | Freq: Once | INTRAVENOUS | Status: AC | PRN
  Administered 2021-11-09: 12:00:00 0.5 mg via INTRAVENOUS
  Filled 2021-11-09: qty 1

## 2021-11-09 MED ORDER — PALONOSETRON HCL INJECTION 0.25 MG/5ML
0.2500 mg | Freq: Once | INTRAVENOUS | Status: AC
  Administered 2021-11-09: 11:00:00 0.25 mg via INTRAVENOUS
  Filled 2021-11-09: qty 5

## 2021-11-09 MED ORDER — SODIUM CHLORIDE 0.9 % IV SOLN
125.0000 mg/m2 | Freq: Once | INTRAVENOUS | Status: AC
  Administered 2021-11-09: 13:00:00 220 mg via INTRAVENOUS
  Filled 2021-11-09: qty 10

## 2021-11-09 NOTE — Patient Instructions (Signed)
[facility] ONCOLOGY  Discharge Instructions: Thank you for choosing [facility] to provide your oncology and hematology care.  If you have a lab appointment with the [facility], please go directly to the [facility] and check in at the registration area.  Wear comfortable clothing and clothing appropriate for easy access to any Portacath or PICC line.   We strive to give you quality time with your provider. You may need to reschedule your appointment if you arrive late (15 or more minutes).  Arriving late affects you and other patients whose appointments are after yours.  Also, if you miss three or more appointments without notifying the office, you may be dismissed from the clinic at the provider's discretion.      For prescription refill requests, have your pharmacy contact our office and allow 72 hours for refills to be completed.    Today you received the following chemotherapy and/or immunotherapy agents Irinotecan and Panitumumab       To help prevent nausea and vomiting after your treatment, we encourage you to take your nausea medication as directed.  BELOW ARE SYMPTOMS THAT SHOULD BE REPORTED IMMEDIATELY: *FEVER GREATER THAN 100.4 F (38 C) OR HIGHER *CHILLS OR SWEATING *NAUSEA AND VOMITING THAT IS NOT CONTROLLED WITH YOUR NAUSEA MEDICATION *UNUSUAL SHORTNESS OF BREATH *UNUSUAL BRUISING OR BLEEDING *URINARY PROBLEMS (pain or burning when urinating, or frequent urination) *BOWEL PROBLEMS (unusual diarrhea, constipation, pain near the anus) TENDERNESS IN MOUTH AND THROAT WITH OR WITHOUT PRESENCE OF ULCERS (sore throat, sores in mouth, or a toothache) UNUSUAL RASH, SWELLING OR PAIN  UNUSUAL VAGINAL DISCHARGE OR ITCHING   Items with * indicate a potential emergency and should be followed up as soon as possible or go to the Emergency Department if any problems should occur.  Please show the CHEMOTHERAPY ALERT CARD or IMMUNOTHERAPY ALERT  CARD at check-in to the Emergency Department and triage nurse.  Should you have questions after your visit or need to cancel or reschedule your appointment, please contact [facility]  [phone] and follow the prompts.  Office hours are 8:00 a.m. to 4:30 p.m. Monday - Friday. Please note that voicemails left after 4:00 p.m. may not be returned until the following business day.  We are closed weekends and major holidays. You have access to a nurse at all times for urgent questions. Please call the main number to the clinic [phone] and follow the prompts.  For any non-urgent questions, you may also contact your provider using MyChart. We now offer e-Visits for anyone 67 and older to request care online for non-urgent symptoms. For details visit [email or web address].   Also download the MyChart app! Go to the app store, search "MyChart", open the app, select [facility], and log in with your MyChart username and password.  Due to Covid, a mask is required upon entering the hospital/clinic. If you do not have a mask, one will be given to you upon arrival. For doctor visits, patients may have 1 support person aged 26 or older with them. For treatment visits, patients cannot have anyone with them due to current Covid guidelines and our immunocompromised population.

## 2021-11-09 NOTE — Progress Notes (Signed)
Pt has no concerns, he is eating and drinking good, no issues with urinary, or bowels. Sleeping good

## 2021-11-09 NOTE — Progress Notes (Signed)
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 He currently smokes 0.5ppd. he presented to the ER with symptoms of left sided chest pain and left arm pain. Troponin was negative ekg was unremarkable. Ct chest showed no PE. He was incidentally noted to have mediastinal and retrocrural adenopathy and a 2.1X2.3X4.4 cm retroaortic soft tissue lesion in the posterior left chest. 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  Low-grade activity in the left lateral fifth and sixth ribs associated with nondisplaced fractures likely reflecting healing response problem malignancy.   Patient underwent CT-guided biopsy of the retroperitoneal lymph node pathology showed metastatic adenocarcinoma compatible with colorectal origin.  CK7 negative.  CK20 positive.  CDX 2+.  TTF-1 negative.  PSA negative.  This pattern of immunoreactivity supports the above diagnosis. Patient underwent colonoscopy which showed sigmoid mass that was consistent with adenocarcinoma.  RAS panel testing showed that he was wild-type for both K-ras and BRAF   FOLFOX and bevacizumab chemotherapy started in July 2020.  Subsequently oxaliplatin has been on hold given neuropathy.   Recent scans have shown slow increase in the size of intra-abdominal adenopathy but patient continues to have low-volume disease.plan is to switch him to second line Xeloda irinotecan panitumumab  regimen    Interval history-tolerating treatment well without any significant side effects.  Denies any rectal bleeding nausea vomiting diarrhea or skin rash.  Appetite is good and he has kept his weight stable.  ECOG PS- 1 Pain scale- 0 Opioid associated constipation- no  Review of systems- Review of Systems  Constitutional:  Negative for chills, fever, malaise/fatigue and weight loss.  HENT:  Negative for congestion, ear discharge and nosebleeds.   Eyes:  Negative for blurred vision.  Respiratory:  Negative for cough, hemoptysis, sputum production, shortness of breath and wheezing.   Cardiovascular:  Negative for chest pain, palpitations, orthopnea and claudication.  Gastrointestinal:  Negative for abdominal pain, blood in stool, constipation, diarrhea, heartburn, melena, nausea and vomiting.  Genitourinary:  Negative for dysuria, flank pain, frequency, hematuria and urgency.  Musculoskeletal:  Negative for back pain, joint pain and myalgias.  Skin:  Negative for rash.  Neurological:  Negative for dizziness, tingling, focal weakness, seizures, weakness and headaches.  Endo/Heme/Allergies:  Does not bruise/bleed easily.  Psychiatric/Behavioral:  Negative for depression and suicidal ideas. The patient does not have insomnia.      No Known Allergies   Past Medical History:  Diagnosis Date   Colon cancer ([place])    COPD (chronic obstructive pulmonary disease) ([place])    Hyperlipidemia      Past Surgical History:  Procedure Laterality Date   COLONOSCOPY WITH PROPOFOL N/A 06/26/2019   Procedure: COLONOSCOPY WITH PROPOFOL;  Surgeon: [name], MD;  Location: [facility] ENDOSCOPY;  Service: Gastroenterology;  Laterality: N/A;   FLEXIBLE SIGMOIDOSCOPY N/A 05/07/2021  Procedure: FLEXIBLE  SIGMOIDOSCOPY;  Surgeon: [name], MD;  Location: [facility] ENDOSCOPY;  Service: Gastroenterology;  Laterality: N/A;   PORTA CATH INSERTION N/A 06/25/2019   Procedure: PORTA CATH INSERTION;  Surgeon: Algernon Huxley, MD;  Location: [facility] CV LAB;  Service: Cardiovascular;  Laterality: N/A;   PORTA CATH INSERTION Left 08/11/2020   Procedure: PORTA CATH INSERTION;  Surgeon: Algernon Huxley, MD;  Location: [facility] CV LAB;  Service: Cardiovascular;  Laterality: Left;   PORTA CATH REMOVAL Right 08/11/2020   Procedure: PORTA CATH REMOVAL;  Surgeon: Algernon Huxley, MD;  Location: [facility] CV LAB;  Service: Cardiovascular;  Laterality: Right;    Social History   Socioeconomic History   Marital status: Single    Spouse name: Not on file   Number of children: Not on file   Years of education: Not on file   Highest education level: Not on file  Occupational History   Not on file  Tobacco Use   Smoking status: Every Day    Packs/day: 0.50    Years: 40.00    Pack years: 20.00    Types: Cigarettes   Smokeless tobacco: Never   Tobacco comments:    smoking 40 years  Vaping Use   Vaping Use: Never used  Substance and Sexual Activity   Alcohol use: No   Drug use: No   Sexual activity: Not Currently  Other Topics Concern   Not on file  Social History Narrative   Not on file   Social Determinants of Health   Financial Resource Strain: Not on file  Food Insecurity: Not on file  Transportation Needs: Not on file  Physical Activity: Not on file  Stress: Not on file  Social Connections: Not on file  Intimate Partner Violence: Not on file    Family History  Problem Relation Age of Onset   Brain cancer Father      Current Outpatient Medications:    allopurinol (ZYLOPRIM) 100 MG tablet, Take 100 mg by mouth daily., Disp: , Rfl:    aspirin EC 81 MG tablet, Take 81 mg by mouth daily., Disp: , Rfl:    azelastine (ASTELIN) 0.1 % nasal spray, Place 1 spray into both nostrils daily as  needed., Disp: , Rfl:    butalbital-acetaminophen-caffeine (FIORICET) 50-325-40 MG tablet, Take 1 tablet by mouth every 4 (four) hours as needed., Disp: , Rfl:    capecitabine (XELODA) 500 MG tablet, Take 1 tablet (500 mg total) by mouth 2 (two) times daily after a meal. Take for 14 days, then hold for 7 days. Repeat every 21 days., Disp: 28 tablet, Rfl: 0   cetirizine (ZYRTEC) 10 MG tablet, Take 1 tablet (10 mg total) by mouth daily., Disp: 30 tablet, Rfl: 0   chlorthalidone (HYGROTON) 25 MG tablet, Take 1 tablet by mouth daily., Disp: , Rfl:    DEXILANT 60 MG capsule, Take 60 mg by mouth daily., Disp: , Rfl:    DULoxetine (CYMBALTA) 30 MG capsule, Take 1 capsule (30 mg total) by mouth 2 (two) times daily., Disp: 60 capsule, Rfl: 1   fluticasone (FLONASE) 50 MCG/ACT nasal spray, Place 2 sprays into both nostrils daily as needed., Disp: , Rfl:    fluticasone (VERAMYST) 27.5 MCG/SPRAY nasal spray, Place 2 sprays into the nose daily as needed., Disp: , Rfl:    lisinopril (ZESTRIL) 5 MG tablet, Take 5 mg by mouth daily., Disp: , Rfl:    loperamide (IMODIUM A-D) 2 MG tablet, Take 1 tablet (  2 mg total) by mouth 4 (four) times daily as needed. Take 2 at diarrhea onset , then 1 every 2hr until 12hrs with no BM. May take 2 every 4hrs at night. If diarrhea recurs repeat., Disp: 100 tablet, Rfl: 1   montelukast (SINGULAIR) 10 MG tablet, Take 10 mg by mouth at bedtime., Disp: , Rfl:    NEXLETOL 180 MG TABS, Take 1 tablet by mouth daily., Disp: , Rfl:    pregabalin (LYRICA) 75 MG capsule, Take 1 capsule (75 mg total) by mouth 2 (two) times daily., Disp: 60 capsule, Rfl: 2   simvastatin (ZOCOR) 20 MG tablet, Take 20 mg by mouth daily. , Disp: , Rfl:    SYMBICORT 80-4.5 MCG/ACT inhaler, Inhale 2 puffs into the lungs daily., Disp: , Rfl:    tamsulosin (FLOMAX) 0.4 MG CAPS capsule, Take 0.4 mg by mouth. , Disp: , Rfl:    magic mouthwash (multi-ingredient) oral suspension, Swish and spit 5-10 mls by mouth 4 times a  day as needed (Patient not taking: Reported on 10/06/2021), Disp: 480 mL, Rfl: 3   magic mouthwash w/lidocaine SOLN, Take 5-10 mLs by mouth 4 (four) times daily as needed for mouth pain. (Patient not taking: Reported on 10/06/2021), Disp: 480 mL, Rfl: 3   nicotine (NICODERM CQ - DOSED IN MG/24 HOURS) 21 mg/24hr patch, Place 1 patch onto the skin 1 day or 1 dose. (Patient not taking: Reported on 10/06/2021), Disp: , Rfl:    oxyCODONE (OXY IR/ROXICODONE) 5 MG immediate release tablet, Take 1 tablet (5 mg total) by mouth every 6 (six) hours as needed for severe pain. (Patient not taking: Reported on 11/09/2021), Disp: 30 tablet, Rfl: 0   potassium chloride SA (KLOR-CON) 20 MEQ tablet, Take 1 tablet (20 mEq total) by mouth daily. (Patient not taking: Reported on 11/09/2021), Disp: 30 tablet, Rfl: 0  Physical exam:  Vitals:   11/09/21 0849  BP: 112/61  Pulse: 75  Resp: 16  Temp: 98 F (36.7 C)  TempSrc: Oral  Weight: 148 lb (67.1 kg)  Height: '5\' 7"'  (1.702 m)   Physical Exam Cardiovascular:     Rate and Rhythm: Normal rate and regular rhythm.     Heart sounds: Normal heart sounds.  Pulmonary:     Effort: Pulmonary effort is normal.     Breath sounds: Normal breath sounds.  Abdominal:     General: Bowel sounds are normal.     Palpations: Abdomen is soft.  Skin:    General: Skin is warm and dry.  Neurological:     Mental Status: He is alert and oriented to person, place, and time.     CMP Latest Ref Rng & Units 10/19/2021  Glucose 70 - 99 mg/dL 103(H)  BUN 8 - 23 mg/dL 29(H)  Creatinine 0.61 - 1.24 mg/dL 1.08  Sodium 135 - 145 mmol/L 135  Potassium 3.5 - 5.1 mmol/L 4.1  Chloride 98 - 111 mmol/L 103  CO2 22 - 32 mmol/L 24  Calcium 8.9 - 10.3 mg/dL 9.0  Total Protein 6.5 - 8.1 g/dL 7.8  Total Bilirubin 0.3 - 1.2 mg/dL 0.8  Alkaline Phos 38 - 126 U/L 110  AST 15 - 41 U/L 42(H)  ALT 0 - 44 U/L 27   CBC Latest Ref Rng & Units 10/19/2021  WBC 4.0 - 10.5 K/uL 5.9  Hemoglobin 13.0 -  17.0 g/dL 13.4  Hematocrit 39.0 - 52.0 % 39.1  Platelets 150 - 400 K/uL 110(L)    Assessment and plan- Patient  is a 70 y.o. male with metastatic K-ras wild-type colon cancer with intra-abdominal lymph node metastases.  He is here for on treatment assessment prior to cycle 6 of panitumumab irinotecan chemotherapy  Patient CEA which was trending down to 3.9 went up to 6.1 last time.We will continue to assess CEA trends and patient will proceed with cycle 6 of panitumumab irinotecan chemotherapy today.  Patient is already on Xeloda 1000 mg twice daily 2 weeks on and 1 week off which she will continue.  Port labs and see covering NP in 3 weeks for cycle 7.  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 ([place])   2. Encounter for monoclonal antibody treatment for malignancy   3. Encounter for antineoplastic chemotherapy   4. High risk medication use      Dr. Randa Evens, MD, MPH [facility] [phone] 11/09/2021 10:01 AM

## 2021-11-09 NOTE — Progress Notes (Signed)
Platelets 87, Per Dr. [name] okay to proceed with treatment.

## 2021-11-10 LAB — CEA: CEA: 4.3 ng/mL (ref 0.0–4.7)

## 2021-11-26 ENCOUNTER — Other Ambulatory Visit (HOSPITAL_COMMUNITY): Payer: Self-pay

## 2021-11-26 ENCOUNTER — Other Ambulatory Visit: Payer: Self-pay | Admitting: Oncology

## 2021-11-26 DIAGNOSIS — C772 Secondary and unspecified malignant neoplasm of intra-abdominal lymph nodes: Secondary | ICD-10-CM

## 2021-11-26 MED ORDER — CAPECITABINE 500 MG PO TABS
500.0000 mg | ORAL_TABLET | Freq: Two times a day (BID) | ORAL | 0 refills | Status: DC
  Filled 2021-11-26: qty 28, 21d supply, fill #0

## 2021-11-29 NOTE — Progress Notes (Signed)
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 He currently smokes 0.5ppd. he presented to the ER with symptoms of left sided chest pain and left arm pain. Troponin was negative ekg was unremarkable. Ct chest showed no PE. He was incidentally noted to have mediastinal and retrocrural adenopathy and a 2.1X2.3X4.4 cm retroaortic soft tissue lesion in the posterior left chest. 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  Low-grade activity in the left lateral fifth and sixth ribs associated with nondisplaced fractures likely reflecting healing response problem malignancy.   Patient underwent CT-guided biopsy of the retroperitoneal lymph node pathology showed metastatic adenocarcinoma compatible with colorectal origin.  CK7 negative.  CK20 positive.  CDX 2+.  TTF-1 negative.  PSA negative.  This pattern of immunoreactivity supports the above diagnosis. Patient underwent colonoscopy which showed sigmoid mass that was consistent with adenocarcinoma.  RAS panel testing showed that he was wild-type for both K-ras and BRAF   FOLFOX and bevacizumab chemotherapy started in July 2020.  Subsequently oxaliplatin has been on hold given neuropathy.   Recent scans have shown slow increase in the size of intra-abdominal adenopathy but patient continues to have low-volume disease.plan is to switch him to second line Xeloda irinotecan panitumumab  regimen   Interval history-patient is tolerating treatments well without any side effects.  Started back to Xeloda today.  Specifically denies any diarrhea, nausea, vomiting, rectal bleeding or rash.  States he is eating and drinking well.  ECOG PS- 1 Pain scale- 0 Opioid associated constipation- no  Review of systems- Review of Systems  Constitutional: Negative.  Negative for chills, fever, malaise/fatigue and weight loss.  HENT:  Negative for congestion, ear pain and tinnitus.   Eyes: Negative.  Negative for blurred vision and double vision.  Respiratory: Negative.  Negative for cough, sputum production and shortness of breath.   Cardiovascular: Negative.  Negative for chest pain, palpitations and leg swelling.  Gastrointestinal: Negative.  Negative for abdominal pain, constipation, diarrhea, nausea and vomiting.  Genitourinary:  Negative for dysuria, frequency and urgency.  Musculoskeletal:  Negative for back pain and falls.  Skin: Negative.  Negative for rash.  Neurological: Negative.  Negative for weakness and headaches.  Endo/Heme/Allergies: Negative.  Does not bruise/bleed easily.  Psychiatric/Behavioral: Negative.  Negative for depression. The patient is not nervous/anxious and does not have insomnia.      No Known Allergies   Past Medical History:  Diagnosis Date   Colon cancer ([place])    COPD (chronic obstructive pulmonary disease) ([place])    Hyperlipidemia      Past Surgical History:  Procedure Laterality Date   COLONOSCOPY WITH PROPOFOL N/A 06/26/2019   Procedure: COLONOSCOPY WITH PROPOFOL;  Surgeon: [name], MD;  Location: [facility] ENDOSCOPY;  Service: Gastroenterology;  Laterality: N/A;   FLEXIBLE SIGMOIDOSCOPY N/A 05/07/2021  Procedure: FLEXIBLE SIGMOIDOSCOPY;  Surgeon:  [name], MD;  Location: [facility] ENDOSCOPY;  Service: Gastroenterology;  Laterality: N/A;   PORTA CATH INSERTION N/A 06/25/2019   Procedure: PORTA CATH INSERTION;  Surgeon: Algernon Huxley, MD;  Location: [place] [facility];  Service: Cardiovascular;  Laterality: N/A;   PORTA CATH INSERTION Left 08/11/2020   Procedure: PORTA CATH INSERTION;  Surgeon: Algernon Huxley, MD;  Location: [place] [facility];  Service: Cardiovascular;  Laterality: Left;   PORTA CATH REMOVAL Right 08/11/2020   Procedure: PORTA CATH REMOVAL;  Surgeon: Algernon Huxley, MD;  Location: [place];  Service: Cardiovascular;  Laterality: Right;    Social History   Socioeconomic History   Marital status: Single    Spouse name: Not on file   Number of children: Not on file   Years of education: Not on file   Highest education level: Not on file  Occupational History   Not on file  Tobacco Use   Smoking status: Every Day    Packs/day: 0.50    Years: 40.00    Pack years: 20.00    Types: Cigarettes   Smokeless tobacco: Never   Tobacco comments:    smoking 40 years  Vaping Use   Vaping Use: Never used  Substance and Sexual Activity   Alcohol use: No   Drug use: No   Sexual activity: Not Currently  Other Topics Concern   Not on file  Social History Narrative   Not on file   Social Determinants of Health   Financial Resource Strain: Not on file  Food Insecurity: Not on file  Transportation Needs: Not on file  Physical Activity: Not on file  Stress: Not on file  Social Connections: Not on file  Intimate Partner Violence: Not on file    Family History  Problem Relation Age of Onset   Brain cancer Father      Current Outpatient Medications:    allopurinol (ZYLOPRIM) 100 MG tablet, Take 100 mg by mouth daily., Disp: , Rfl:    aspirin EC 81 MG tablet, Take 81 mg by mouth daily., Disp: , Rfl:    azelastine (ASTELIN) 0.1 % nasal spray, Place 1 spray into both nostrils daily as needed., Disp: , Rfl:     butalbital-acetaminophen-caffeine (FIORICET) 50-325-40 MG tablet, Take 1 tablet by mouth every 4 (four) hours as needed., Disp: , Rfl:    capecitabine (XELODA) 500 MG tablet, Take 1 tablet (500 mg total) by mouth 2 (two) times daily after a meal. Take for 14 days, then hold for 7 days. Repeat every 21 days., Disp: 28 tablet, Rfl: 0   cetirizine (ZYRTEC) 10 MG tablet, Take 1 tablet (10 mg total) by mouth daily., Disp: 30 tablet, Rfl: 0   chlorthalidone (HYGROTON) 25 MG tablet, Take 1 tablet by mouth daily., Disp: , Rfl:    DEXILANT 60 MG capsule, Take 60 mg by mouth daily., Disp: , Rfl:    DULoxetine (CYMBALTA) 30 MG capsule, Take 1 capsule (30 mg total) by mouth 2 (two) times daily., Disp: 60 capsule, Rfl: 1   fluticasone (FLONASE) 50 MCG/ACT nasal spray, Place 2 sprays into both nostrils daily as needed., Disp: , Rfl:    fluticasone (VERAMYST) 27.5 MCG/SPRAY nasal spray, Place 2 sprays into the nose daily as needed., Disp: , Rfl:    loperamide (IMODIUM A-D) 2 MG tablet, Take 1 tablet (2 mg total) by mouth 4 (four) times daily as needed. Take 2 at diarrhea onset ,  then 1 every 2hr until 12hrs with no BM. May take 2 every 4hrs at night. If diarrhea recurs repeat., Disp: 100 tablet, Rfl: 1   magic mouthwash (multi-ingredient) oral suspension, Swish and spit 5-10 mls by mouth 4 times a day as needed, Disp: 480 mL, Rfl: 3   magic mouthwash w/lidocaine SOLN, Take 5-10 mLs by mouth 4 (four) times daily as needed for mouth pain., Disp: 480 mL, Rfl: 3   montelukast (SINGULAIR) 10 MG tablet, Take 10 mg by mouth at bedtime., Disp: , Rfl:    NEXLETOL 180 MG TABS, Take 1 tablet by mouth daily., Disp: , Rfl:    nicotine (NICODERM CQ - DOSED IN MG/24 HOURS) 21 mg/24hr patch, Place 1 patch onto the skin 1 day or 1 dose., Disp: , Rfl:    oxyCODONE (OXY IR/ROXICODONE) 5 MG immediate release tablet, Take 1 tablet (5 mg total) by mouth every 6 (six) hours as needed for severe pain., Disp: 30 tablet, Rfl: 0   potassium  chloride SA (KLOR-CON) 20 MEQ tablet, Take 1 tablet (20 mEq total) by mouth daily., Disp: 30 tablet, Rfl: 0   pregabalin (LYRICA) 75 MG capsule, Take 1 capsule (75 mg total) by mouth 2 (two) times daily., Disp: 60 capsule, Rfl: 2   simvastatin (ZOCOR) 20 MG tablet, Take 20 mg by mouth daily. , Disp: , Rfl:    SYMBICORT 80-4.5 MCG/ACT inhaler, Inhale 2 puffs into the lungs daily., Disp: , Rfl:    tamsulosin (FLOMAX) 0.4 MG CAPS capsule, Take 0.4 mg by mouth. , Disp: , Rfl:    lisinopril (ZESTRIL) 5 MG tablet, Take 5 mg by mouth daily., Disp: , Rfl:  No current facility-administered medications for this visit.  Facility-Administered Medications Ordered in Other Visits:    irinotecan (CAMPTOSAR) 220 mg in sodium chloride 0.9 % 500 mL chemo infusion, 125 mg/m2 (Treatment Plan Recorded), Intravenous, Once, Sindy Guadeloupe, MD, Last Rate: 341 mL/hr at 11/30/21 1141, 220 mg at 11/30/21 1141  Physical exam:  Vitals:   11/30/21 0920  BP: (!) 119/59  Pulse: 76  Temp: 97.9 F (36.6 C)  TempSrc: Tympanic  SpO2: 100%  Weight: 147 lb 3.2 oz (66.8 kg)  Height: _0  (1.702 m)   Physical Exam Constitutional:      Appearance: Normal appearance.  HENT:     Head: Normocephalic and atraumatic.  Eyes:     Pupils: Pupils are equal, round, and reactive to light.  Cardiovascular:     Rate and Rhythm: Normal rate and regular rhythm.     Heart sounds: Normal heart sounds. No murmur heard. Pulmonary:     Effort: Pulmonary effort is normal.     Breath sounds: Normal breath sounds. No wheezing.  Abdominal:     General: Bowel sounds are normal. There is no distension.     Palpations: Abdomen is soft.     Tenderness: There is no abdominal tenderness.  Musculoskeletal:        General: Normal range of motion.     Cervical back: Normal range of motion.  Skin:    General: Skin is warm and dry.     Findings: No rash.  Neurological:     Mental Status: He is alert and oriented to person, place, and time.   Psychiatric:        Judgment: Judgment normal.     CMP Latest Ref Rng & Units 11/30/2021  Glucose 70 - 99 mg/dL 127(H)  BUN 8 - 23 mg/dL 28(H)  Creatinine  0.61 - 1.24 mg/dL 0.98  Sodium 135 - 145 mmol/L 136  Potassium 3.5 - 5.1 mmol/L 4.0  Chloride 98 - 111 mmol/L 107  CO2 22 - 32 mmol/L 23  Calcium 8.9 - 10.3 mg/dL 9.0  Total Protein 6.5 - 8.1 g/dL 7.4  Total Bilirubin 0.3 - 1.2 mg/dL 0.8  Alkaline Phos 38 - 126 U/L 101  AST 15 - 41 U/L 38  ALT 0 - 44 U/L 25   CBC Latest Ref Rng & Units 11/30/2021  WBC 4.0 - 10.5 K/uL 5.0  Hemoglobin 13.0 - 17.0 g/dL 13.4  Hematocrit 39.0 - 52.0 % 38.5(L)  Platelets 150 - 400 K/uL 97(L)    Assessment and plan- Patient is a 70 y.o. male with metastatic K-ras wild-type colon cancer with intra-abdominal lymph node metastases.  He is here for on treatment assessment prior to cycle 6 of panitumumab irinotecan chemotherapy.  Labs from today are acceptable for treatment.  Proceed with cycle 6.  Continue Xeloda 1000 mg daily x2 weeks with 1 week off.  He started his Xeloda back today.  He has a CT scan scheduled for 12/08/2021.  Chemo induced thrombocytopenia-platelets improved to 97,000.  Continue with treatment per above.  Disposition- CT scan on 12/08/2021. RTC on 12/21/2021 for follow-up with Dr. [name] and cycle 7 chemotherapy.  I spent 20 minutes dedicated to the care of this patient (face-to-face and non-face-to-face) on the date of the encounter to include what is described in the assessment and plan.   Visit Diagnosis No diagnosis found.  Faythe Casa, NP 11/30/2021 12:52 PM

## 2021-11-30 ENCOUNTER — Other Ambulatory Visit: Payer: Self-pay

## 2021-11-30 ENCOUNTER — Inpatient Hospital Stay: Payer: Medicare HMO

## 2021-11-30 ENCOUNTER — Encounter: Payer: Self-pay | Admitting: Oncology

## 2021-11-30 ENCOUNTER — Inpatient Hospital Stay: Payer: Medicare HMO | Attending: Oncology | Admitting: Oncology

## 2021-11-30 VITALS — BP 119/59 | HR 76 | Temp 97.9°F | Ht 67.0 in | Wt 147.2 lb

## 2021-11-30 DIAGNOSIS — J449 Chronic obstructive pulmonary disease, unspecified: Secondary | ICD-10-CM | POA: Diagnosis not present

## 2021-11-30 DIAGNOSIS — C772 Secondary and unspecified malignant neoplasm of intra-abdominal lymph nodes: Secondary | ICD-10-CM | POA: Diagnosis not present

## 2021-11-30 DIAGNOSIS — Z5111 Encounter for antineoplastic chemotherapy: Secondary | ICD-10-CM | POA: Insufficient documentation

## 2021-11-30 DIAGNOSIS — D6959 Other secondary thrombocytopenia: Secondary | ICD-10-CM | POA: Insufficient documentation

## 2021-11-30 DIAGNOSIS — C187 Malignant neoplasm of sigmoid colon: Secondary | ICD-10-CM | POA: Diagnosis not present

## 2021-11-30 DIAGNOSIS — Z95828 Presence of other vascular implants and grafts: Secondary | ICD-10-CM

## 2021-11-30 DIAGNOSIS — Z5112 Encounter for antineoplastic immunotherapy: Secondary | ICD-10-CM | POA: Insufficient documentation

## 2021-11-30 DIAGNOSIS — E785 Hyperlipidemia, unspecified: Secondary | ICD-10-CM | POA: Insufficient documentation

## 2021-11-30 DIAGNOSIS — C786 Secondary malignant neoplasm of retroperitoneum and peritoneum: Secondary | ICD-10-CM | POA: Diagnosis not present

## 2021-11-30 DIAGNOSIS — R970 Elevated carcinoembryonic antigen [CEA]: Secondary | ICD-10-CM | POA: Diagnosis not present

## 2021-11-30 DIAGNOSIS — C189 Malignant neoplasm of colon, unspecified: Secondary | ICD-10-CM

## 2021-11-30 DIAGNOSIS — T451X5A Adverse effect of antineoplastic and immunosuppressive drugs, initial encounter: Secondary | ICD-10-CM | POA: Diagnosis not present

## 2021-11-30 LAB — CBC WITH DIFFERENTIAL/PLATELET
Abs Immature Granulocytes: 0.03 10*3/uL (ref 0.00–0.07)
Basophils Absolute: 0.1 10*3/uL (ref 0.0–0.1)
Basophils Relative: 1 %
Eosinophils Absolute: 0.2 10*3/uL (ref 0.0–0.5)
Eosinophils Relative: 4 %
HCT: 38.5 % — ABNORMAL LOW (ref 39.0–52.0)
Hemoglobin: 13.4 g/dL (ref 13.0–17.0)
Immature Granulocytes: 1 %
Lymphocytes Relative: 32 %
Lymphs Abs: 1.6 10*3/uL (ref 0.7–4.0)
MCH: 33.7 pg (ref 26.0–34.0)
MCHC: 34.8 g/dL (ref 30.0–36.0)
MCV: 96.7 fL (ref 80.0–100.0)
Monocytes Absolute: 0.6 10*3/uL (ref 0.1–1.0)
Monocytes Relative: 11 %
Neutro Abs: 2.6 10*3/uL (ref 1.7–7.7)
Neutrophils Relative %: 51 %
Platelets: 97 10*3/uL — ABNORMAL LOW (ref 150–400)
RBC: 3.98 MIL/uL — ABNORMAL LOW (ref 4.22–5.81)
RDW: 15.7 % — ABNORMAL HIGH (ref 11.5–15.5)
WBC: 5 10*3/uL (ref 4.0–10.5)
nRBC: 0 % (ref 0.0–0.2)

## 2021-11-30 LAB — COMPREHENSIVE METABOLIC PANEL
ALT: 25 U/L (ref 0–44)
AST: 38 U/L (ref 15–41)
Albumin: 4 g/dL (ref 3.5–5.0)
Alkaline Phosphatase: 101 U/L (ref 38–126)
Anion gap: 6 (ref 5–15)
BUN: 28 mg/dL — ABNORMAL HIGH (ref 8–23)
CO2: 23 mmol/L (ref 22–32)
Calcium: 9 mg/dL (ref 8.9–10.3)
Chloride: 107 mmol/L (ref 98–111)
Creatinine, Ser: 0.98 mg/dL (ref 0.61–1.24)
GFR, Estimated: >60 mL/min (ref >60)
Glucose, Bld: 127 mg/dL — ABNORMAL HIGH (ref 70–99)
Potassium: 4 mmol/L (ref 3.5–5.1)
Sodium: 136 mmol/L (ref 135–145)
Total Bilirubin: 0.8 mg/dL (ref 0.3–1.2)
Total Protein: 7.4 g/dL (ref 6.5–8.1)

## 2021-11-30 LAB — MAGNESIUM: Magnesium: 2.1 mg/dL (ref 1.7–2.4)

## 2021-11-30 MED ORDER — SODIUM CHLORIDE 0.9 % IV SOLN
125.0000 mg/m2 | Freq: Once | INTRAVENOUS | Status: AC
  Administered 2021-11-30: 12:00:00 220 mg via INTRAVENOUS
  Filled 2021-11-30: qty 10

## 2021-11-30 MED ORDER — SODIUM CHLORIDE 0.9% FLUSH
10.0000 mL | Freq: Once | INTRAVENOUS | Status: AC
  Administered 2021-11-30: 09:00:00 10 mL via INTRAVENOUS
  Filled 2021-11-30: qty 10

## 2021-11-30 MED ORDER — ATROPINE SULFATE 1 MG/ML IV SOLN
0.5000 mg | Freq: Once | INTRAVENOUS | Status: AC | PRN
  Administered 2021-11-30: 12:00:00 0.5 mg via INTRAVENOUS
  Filled 2021-11-30: qty 1

## 2021-11-30 MED ORDER — HEPARIN SOD (PORK) LOCK FLUSH 100 UNIT/ML IV SOLN
500.0000 [IU] | Freq: Once | INTRAVENOUS | Status: AC
  Administered 2021-11-30: 13:00:00 500 [IU] via INTRAVENOUS
  Filled 2021-11-30: qty 5

## 2021-11-30 MED ORDER — SODIUM CHLORIDE 0.9 % IV SOLN
Freq: Once | INTRAVENOUS | Status: AC
  Filled 2021-11-30: qty 250

## 2021-11-30 MED ORDER — SODIUM CHLORIDE 0.9 % IV SOLN
6.0000 mg/kg | Freq: Once | INTRAVENOUS | Status: AC
  Administered 2021-11-30: 11:00:00 400 mg via INTRAVENOUS
  Filled 2021-11-30: qty 20

## 2021-11-30 MED ORDER — PALONOSETRON HCL INJECTION 0.25 MG/5ML
0.2500 mg | Freq: Once | INTRAVENOUS | Status: AC
  Administered 2021-11-30: 10:00:00 0.25 mg via INTRAVENOUS
  Filled 2021-11-30: qty 5

## 2021-11-30 MED ORDER — HEPARIN SOD (PORK) LOCK FLUSH 100 UNIT/ML IV SOLN
INTRAVENOUS | Status: AC
  Filled 2021-11-30: qty 5

## 2021-11-30 MED ORDER — SODIUM CHLORIDE 0.9 % IV SOLN
10.0000 mg | Freq: Once | INTRAVENOUS | Status: AC
  Administered 2021-11-30: 10:00:00 10 mg via INTRAVENOUS
  Filled 2021-11-30: qty 10

## 2021-11-30 NOTE — Patient Instructions (Signed)
[facility] AT [place]  Discharge Instructions: Thank you for choosing [facility] to provide your oncology and hematology care.   If you have a lab appointment with the [facility], please go directly to the [facility] and check in at the registration area.   Wear comfortable clothing and clothing appropriate for easy access to any Portacath or PICC line.   We strive to give you quality time with your provider. You may need to reschedule your appointment if you arrive late (15 or more minutes).  Arriving late affects you and other patients whose appointments are after yours.  Also, if you miss three or more appointments without notifying the office, you may be dismissed from the clinic at the providers discretion.      For prescription refill requests, have your pharmacy contact our office and allow 72 hours for refills to be completed.    Today you received the following chemotherapy and/or immunotherapy agents VECTIBEX and IRINOTECAN      To help prevent nausea and vomiting after your treatment, we encourage you to take your nausea medication as directed.  BELOW ARE SYMPTOMS THAT SHOULD BE REPORTED IMMEDIATELY: *FEVER GREATER THAN 100.4 F (38 C) OR HIGHER *CHILLS OR SWEATING *NAUSEA AND VOMITING THAT IS NOT CONTROLLED WITH YOUR NAUSEA MEDICATION *UNUSUAL SHORTNESS OF BREATH *UNUSUAL BRUISING OR BLEEDING *URINARY PROBLEMS (pain or burning when urinating, or frequent urination) *BOWEL PROBLEMS (unusual diarrhea, constipation, pain near the anus) TENDERNESS IN MOUTH AND THROAT WITH OR WITHOUT PRESENCE OF ULCERS (sore throat, sores in mouth, or a toothache) UNUSUAL RASH, SWELLING OR PAIN  UNUSUAL VAGINAL DISCHARGE OR ITCHING   Items with * indicate a potential emergency and should be followed up as soon as possible or go to the Emergency Department if any problems should occur.  Please show the CHEMOTHERAPY ALERT CARD or IMMUNOTHERAPY ALERT CARD  at check-in to the Emergency Department and triage nurse.  Should you have questions after your visit or need to cancel or reschedule your appointment, please contact [facility] [place]  Dept: [phone]  and follow the prompts.  Office hours are 8:00 a.m. to 4:30 p.m. Monday - Friday. Please note that voicemails left after 4:00 p.m. may not be returned until the following business day.  We are closed weekends and major holidays. You have access to a nurse at all times for urgent questions. Please call the main number to the clinic Dept: [phone] and follow the prompts.   For any non-urgent questions, you may also contact your provider using MyChart. We now offer e-Visits for anyone 11 and older to request care online for non-urgent symptoms. For details visit [email or web address].   Also download the MyChart app! Go to the app store, search "MyChart", open the app, select [facility], and log in with your MyChart username and password.  Due to Covid, a mask is required upon entering the hospital/clinic. If you do not have a mask, one will be given to you upon arrival. For doctor visits, patients may have 1 support person aged 9 or older with them. For treatment visits, patients cannot have anyone with them due to current Covid guidelines and our immunocompromised population.   Panitumumab Solution for Injection What is this medication? PANITUMUMAB (pan i TOOM ue mab) is a monoclonal antibody. It is used to treat colorectal cancer. This medicine may be used for other purposes; ask your health care provider or pharmacist if you have questions. COMMON BRAND NAME(S):  Vectibix What should I tell my care team before I take this medication? They need to know if you have any of these conditions: eye disease, vision problems low levels of calcium, magnesium, or potassium in the blood lung or breathing disease, like asthma skin conditions or sensitivity an unusual or  allergic reaction to panitumumab, other medicines, foods, dyes, or preservatives pregnant or trying to get pregnant breast-feeding How should I use this medication? This drug is given as an infusion into a vein. It is administered in a hospital or clinic by a specially trained health care professional. Talk to your pediatrician regarding the use of this medicine in children. Special care may be needed. Overdosage: If you think you have taken too much of this medicine contact a poison control center or emergency room at once. NOTE: This medicine is only for you. Do not share this medicine with others. What if I miss a dose? It is important not to miss your dose. Call your doctor or health care professional if you are unable to keep an appointment. What may interact with this medication? Do not take this medicine with any of the following medications: bevacizumab This list may not describe all possible interactions. Give your health care provider a list of all the medicines, herbs, non-prescription drugs, or dietary supplements you use. Also tell them if you smoke, drink alcohol, or use illegal drugs. Some items may interact with your medicine. What should I watch for while using this medication? Visit your doctor for checks on your progress. This drug may make you feel generally unwell. This is not uncommon, as chemotherapy can affect healthy cells as well as cancer cells. Report any side effects. Continue your course of treatment even though you feel ill unless your doctor tells you to stop. This medicine can make you more sensitive to the sun. Keep out of the sun while receiving this medicine and for 2 months after the last dose. If you cannot avoid being in the sun, wear protective clothing and use sunscreen. Do not use sun lamps or tanning beds/booths. In some cases, you may be given additional medicines to help with side effects. Follow all directions for their use. Call your doctor or health  care professional for advice if you get a fever, chills or sore throat, or other symptoms of a cold or flu. Do not treat yourself. This drug decreases your body's ability to fight infections. Try to avoid being around people who are sick. Avoid taking products that contain aspirin, acetaminophen, ibuprofen, naproxen, or ketoprofen unless instructed by your doctor. These medicines may hide a fever. Do not become pregnant while taking this medicine and for 2 months after the last dose. Women should inform their doctor if they wish to become pregnant or think they might be pregnant. There is a potential for serious side effects to an unborn child. Talk to your health care professional or pharmacist for more information. Do not breast-feed an infant while taking this medicine or for 2 months after the last dose. What side effects may I notice from receiving this medication? Side effects that you should report to your doctor or health care professional as soon as possible: allergic reactions like skin rash, itching or hives, swelling of the face, lips, or tongue breathing problems changes in vision eye pain fast, irregular heartbeat fever, chills mouth sores red spots on the skin redness, blistering, peeling or loosening of the skin, including inside the mouth signs and symptoms of kidney injury like  trouble passing urine or change in the amount of urine signs and symptoms of low blood pressure like dizziness; feeling faint or lightheaded, falls; unusually weak or tired signs of low calcium like fast heartbeat, muscle cramps or muscle pain; pain, tingling, numbness in the hands or feet; seizures signs and symptoms of low magnesium like muscle cramps, pain, or weakness; tremors; seizures; or fast, irregular heartbeat signs and symptoms of low potassium like muscle cramps or muscle pain; chest pain; dizziness; feeling faint or lightheaded, falls; palpitations; breathing problems; or fast, irregular  heartbeat swelling of the ankles, feet, hands Side effects that usually do not require medical attention (report to your doctor or health care professional if they continue or are bothersome): changes in skin like acne, cracks, skin dryness diarrhea eyelash growth headache mouth sores nail changes nausea, vomiting This list may not describe all possible side effects. Call your doctor for medical advice about side effects. You may report side effects to FDA at 1-800-FDA-1088. Where should I keep my medication? This drug is given in a hospital or clinic and will not be stored at home. NOTE: This sheet is a summary. It may not cover all possible information. If you have questions about this medicine, talk to your doctor, pharmacist, or health care provider.  Irinotecan injection What is this medication? IRINOTECAN (ir in oh TEE kan ) is a chemotherapy drug. It is used to treat colon and rectal cancer. This medicine may be used for other purposes; ask your health care provider or pharmacist if you have questions. COMMON BRAND NAME(S): Camptosar What should I tell my care team before I take this medication? They need to know if you have any of these conditions: dehydration diarrhea infection (especially a virus infection such as chickenpox, cold sores, or herpes) liver disease low blood counts, like low white cell, platelet, or red cell counts low levels of calcium, magnesium, or potassium in the blood recent or ongoing radiation therapy an unusual or allergic reaction to irinotecan, other medicines, foods, dyes, or preservatives pregnant or trying to get pregnant breast-feeding How should I use this medication? This drug is given as an infusion into a vein. It is administered in a hospital or clinic by a specially trained health care professional. Talk to your pediatrician regarding the use of this medicine in children. Special care may be needed. Overdosage: If you think you have  taken too much of this medicine contact a poison control center or emergency room at once. NOTE: This medicine is only for you. Do not share this medicine with others. What if I miss a dose? It is important not to miss your dose. Call your doctor or health care professional if you are unable to keep an appointment. What may interact with this medication? Do not take this medicine with any of the following medications: cobicistat itraconazole This medicine may interact with the following medications: antiviral medicines for HIV or AIDS certain antibiotics like rifampin or rifabutin certain medicines for fungal infections like ketoconazole, posaconazole, and voriconazole certain medicines for seizures like carbamazepine, phenobarbital, phenotoin clarithromycin gemfibrozil nefazodone [facility] John's Wort This list may not describe all possible interactions. Give your health care provider a list of all the medicines, herbs, non-prescription drugs, or dietary supplements you use. Also tell them if you smoke, drink alcohol, or use illegal drugs. Some items may interact with your medicine. What should I watch for while using this medication? Your condition will be monitored carefully while you are receiving this medicine.  You will need important blood work done while you are taking this medicine. This drug may make you feel generally unwell. This is not uncommon, as chemotherapy can affect healthy cells as well as cancer cells. Report any side effects. Continue your course of treatment even though you feel ill unless your doctor tells you to stop. In some cases, you may be given additional medicines to help with side effects. Follow all directions for their use. You may get drowsy or dizzy. Do not drive, use machinery, or do anything that needs mental alertness until you know how this medicine affects you. Do not stand or sit up quickly, especially if you are an older patient. This reduces the risk of  dizzy or fainting spells. Call your health care professional for advice if you get a fever, chills, or sore throat, or other symptoms of a cold or flu. Do not treat yourself. This medicine decreases your body's ability to fight infections. Try to avoid being around people who are sick. Avoid taking products that contain aspirin, acetaminophen, ibuprofen, naproxen, or ketoprofen unless instructed by your doctor. These medicines may hide a fever. This medicine may increase your risk to bruise or bleed. Call your doctor or health care professional if you notice any unusual bleeding. Be careful brushing and flossing your teeth or using a toothpick because you may get an infection or bleed more easily. If you have any dental work done, tell your dentist you are receiving this medicine. Do not become pregnant while taking this medicine or for 6 months after stopping it. Women should inform their health care professional if they wish to become pregnant or think they might be pregnant. Men should not father a child while taking this medicine and for 3 months after stopping it. There is potential for serious side effects to an unborn child. Talk to your health care professional for more information. Do not breast-feed an infant while taking this medicine or for 7 days after stopping it. This medicine has caused ovarian failure in some women. This medicine may make it more difficult to get pregnant. Talk to your health care professional if you are concerned about your fertility. This medicine has caused decreased sperm counts in some men. This may make it more difficult to father a child. Talk to your health care professional if you are concerned about your fertility. What side effects may I notice from receiving this medication? Side effects that you should report to your doctor or health care professional as soon as possible: allergic reactions like skin rash, itching or hives, swelling of the face, lips, or  tongue chest pain diarrhea flushing, runny nose, sweating during infusion low blood counts - this medicine may decrease the number of white blood cells, red blood cells and platelets. You may be at increased risk for infections and bleeding. nausea, vomiting pain, swelling, warmth in the leg signs of decreased platelets or bleeding - bruising, pinpoint red spots on the skin, black, tarry stools, blood in the urine signs of infection - fever or chills, cough, sore throat, pain or difficulty passing urine signs of decreased red blood cells - unusually weak or tired, fainting spells, lightheadedness Side effects that usually do not require medical attention (report to your doctor or health care professional if they continue or are bothersome): constipation hair loss headache loss of appetite mouth sores stomach pain This list may not describe all possible side effects. Call your doctor for medical advice about side effects. You may report  side effects to FDA at 1-800-FDA-1088. Where should I keep my medication? This drug is given in a hospital or clinic and will not be stored at home. NOTE: This sheet is a summary. It may not cover all possible information. If you have questions about this medicine, talk to your doctor, pharmacist, or health care provider.  2022 [employer] (2021-08-18 00:00:00)

## 2021-11-30 NOTE — Progress Notes (Signed)
Faythe Casa here at [facility]- labs reviewed. OK to proceed with PLT 97

## 2021-12-01 LAB — CANCER ANTIGEN 19-9: CA 19-9: 23 U/mL (ref 0–35)

## 2021-12-08 ENCOUNTER — Other Ambulatory Visit: Payer: Self-pay

## 2021-12-08 ENCOUNTER — Ambulatory Visit
Admission: RE | Admit: 2021-12-08 | Discharge: 2021-12-08 | Disposition: A | Discharge status: Home / Self Care | Payer: Medicare HMO | Source: Ambulatory Visit | Attending: Oncology | Admitting: Oncology

## 2021-12-08 DIAGNOSIS — C772 Secondary and unspecified malignant neoplasm of intra-abdominal lymph nodes: Secondary | ICD-10-CM | POA: Insufficient documentation

## 2021-12-08 DIAGNOSIS — C189 Malignant neoplasm of colon, unspecified: Secondary | ICD-10-CM | POA: Diagnosis not present

## 2021-12-08 DIAGNOSIS — J432 Centrilobular emphysema: Secondary | ICD-10-CM | POA: Diagnosis not present

## 2021-12-08 DIAGNOSIS — I700 Atherosclerosis of aorta: Secondary | ICD-10-CM | POA: Diagnosis not present

## 2021-12-08 DIAGNOSIS — K5790 Diverticulosis of intestine, part unspecified, without perforation or abscess without bleeding: Secondary | ICD-10-CM | POA: Diagnosis not present

## 2021-12-08 DIAGNOSIS — K429 Umbilical hernia without obstruction or gangrene: Secondary | ICD-10-CM | POA: Diagnosis not present

## 2021-12-08 DIAGNOSIS — J9811 Atelectasis: Secondary | ICD-10-CM | POA: Diagnosis not present

## 2021-12-08 DIAGNOSIS — I2510 Atherosclerotic heart disease of native coronary artery without angina pectoris: Secondary | ICD-10-CM | POA: Diagnosis not present

## 2021-12-08 MED ORDER — IOHEXOL 300 MG/ML  SOLN
100.0000 mL | Freq: Once | INTRAMUSCULAR | Status: AC | PRN
  Administered 2021-12-08: 10:00:00 100 mL via INTRAVENOUS

## 2021-12-14 ENCOUNTER — Other Ambulatory Visit (HOSPITAL_COMMUNITY): Payer: Self-pay

## 2021-12-14 ENCOUNTER — Other Ambulatory Visit: Payer: Self-pay | Admitting: Oncology

## 2021-12-14 DIAGNOSIS — C772 Secondary and unspecified malignant neoplasm of intra-abdominal lymph nodes: Secondary | ICD-10-CM

## 2021-12-14 DIAGNOSIS — C189 Malignant neoplasm of colon, unspecified: Secondary | ICD-10-CM

## 2021-12-14 MED ORDER — CAPECITABINE 500 MG PO TABS
500.0000 mg | ORAL_TABLET | Freq: Two times a day (BID) | ORAL | 0 refills | Status: DC
  Filled 2021-12-14: qty 28, 21d supply, fill #0

## 2021-12-16 ENCOUNTER — Other Ambulatory Visit (HOSPITAL_COMMUNITY): Payer: Self-pay

## 2021-12-16 ENCOUNTER — Other Ambulatory Visit: Payer: Self-pay | Admitting: *Deleted

## 2021-12-16 MED ORDER — PREGABALIN 75 MG PO CAPS: 75.0000 mg | ORAL_CAPSULE | Freq: Two times a day (BID) | ORAL | 2 refills | Status: DC

## 2021-12-21 ENCOUNTER — Inpatient Hospital Stay: Payer: Medicare HMO

## 2021-12-21 ENCOUNTER — Other Ambulatory Visit: Payer: Self-pay

## 2021-12-21 ENCOUNTER — Inpatient Hospital Stay (HOSPITAL_BASED_OUTPATIENT_CLINIC_OR_DEPARTMENT_OTHER): Payer: Medicare HMO | Admitting: Oncology

## 2021-12-21 ENCOUNTER — Encounter: Payer: Self-pay | Admitting: Oncology

## 2021-12-21 ENCOUNTER — Other Ambulatory Visit: Payer: Medicare HMO

## 2021-12-21 ENCOUNTER — Ambulatory Visit: Payer: Medicare HMO

## 2021-12-21 ENCOUNTER — Inpatient Hospital Stay: Payer: Medicare HMO | Attending: Oncology

## 2021-12-21 ENCOUNTER — Ambulatory Visit: Payer: Medicare HMO | Admitting: Oncology

## 2021-12-21 VITALS — BP 122/73 | HR 77 | Temp 96.0°F | Resp 16 | Ht 67.0 in | Wt 152.0 lb

## 2021-12-21 DIAGNOSIS — Z5112 Encounter for antineoplastic immunotherapy: Secondary | ICD-10-CM

## 2021-12-21 DIAGNOSIS — Z79899 Other long term (current) drug therapy: Secondary | ICD-10-CM

## 2021-12-21 DIAGNOSIS — D6959 Other secondary thrombocytopenia: Secondary | ICD-10-CM

## 2021-12-21 DIAGNOSIS — C187 Malignant neoplasm of sigmoid colon: Secondary | ICD-10-CM | POA: Insufficient documentation

## 2021-12-21 DIAGNOSIS — T451X5A Adverse effect of antineoplastic and immunosuppressive drugs, initial encounter: Secondary | ICD-10-CM

## 2021-12-21 DIAGNOSIS — C778 Secondary and unspecified malignant neoplasm of lymph nodes of multiple regions: Secondary | ICD-10-CM | POA: Diagnosis not present

## 2021-12-21 DIAGNOSIS — Z5111 Encounter for antineoplastic chemotherapy: Secondary | ICD-10-CM

## 2021-12-21 DIAGNOSIS — G620 Drug-induced polyneuropathy: Secondary | ICD-10-CM | POA: Diagnosis not present

## 2021-12-21 DIAGNOSIS — F17210 Nicotine dependence, cigarettes, uncomplicated: Secondary | ICD-10-CM | POA: Insufficient documentation

## 2021-12-21 DIAGNOSIS — C189 Malignant neoplasm of colon, unspecified: Secondary | ICD-10-CM

## 2021-12-21 LAB — COMPREHENSIVE METABOLIC PANEL
ALT: 27 U/L (ref 0–44)
AST: 37 U/L (ref 15–41)
Albumin: 4 g/dL (ref 3.5–5.0)
Alkaline Phosphatase: 106 U/L (ref 38–126)
Anion gap: 9 (ref 5–15)
BUN: 24 mg/dL — ABNORMAL HIGH (ref 8–23)
CO2: 23 mmol/L (ref 22–32)
Calcium: 9.2 mg/dL (ref 8.9–10.3)
Chloride: 102 mmol/L (ref 98–111)
Creatinine, Ser: 0.99 mg/dL (ref 0.61–1.24)
GFR, Estimated: >60 mL/min (ref >60)
Glucose, Bld: 144 mg/dL — ABNORMAL HIGH (ref 70–99)
Potassium: 3.3 mmol/L — ABNORMAL LOW (ref 3.5–5.1)
Sodium: 134 mmol/L — ABNORMAL LOW (ref 135–145)
Total Bilirubin: 0.8 mg/dL (ref 0.3–1.2)
Total Protein: 7.4 g/dL (ref 6.5–8.1)

## 2021-12-21 LAB — CBC WITH DIFFERENTIAL/PLATELET
Abs Immature Granulocytes: 0.03 10*3/uL (ref 0.00–0.07)
Basophils Absolute: 0 10*3/uL (ref 0.0–0.1)
Basophils Relative: 1 %
Eosinophils Absolute: 0.2 10*3/uL (ref 0.0–0.5)
Eosinophils Relative: 3 %
HCT: 40.2 % (ref 39.0–52.0)
Hemoglobin: 13.9 g/dL (ref 13.0–17.0)
Immature Granulocytes: 1 %
Lymphocytes Relative: 32 %
Lymphs Abs: 1.8 10*3/uL (ref 0.7–4.0)
MCH: 33 pg (ref 26.0–34.0)
MCHC: 34.6 g/dL (ref 30.0–36.0)
MCV: 95.5 fL (ref 80.0–100.0)
Monocytes Absolute: 0.5 10*3/uL (ref 0.1–1.0)
Monocytes Relative: 9 %
Neutro Abs: 3.1 10*3/uL (ref 1.7–7.7)
Neutrophils Relative %: 54 %
Platelets: 90 10*3/uL — ABNORMAL LOW (ref 150–400)
RBC: 4.21 MIL/uL — ABNORMAL LOW (ref 4.22–5.81)
RDW: 16 % — ABNORMAL HIGH (ref 11.5–15.5)
WBC: 5.7 10*3/uL (ref 4.0–10.5)
nRBC: 0 % (ref 0.0–0.2)

## 2021-12-21 LAB — MAGNESIUM: Magnesium: 1.8 mg/dL (ref 1.7–2.4)

## 2021-12-21 MED ORDER — PALONOSETRON HCL INJECTION 0.25 MG/5ML
0.2500 mg | Freq: Once | INTRAVENOUS | Status: AC
  Administered 2021-12-21: 0.25 mg via INTRAVENOUS
  Filled 2021-12-21: qty 5

## 2021-12-21 MED ORDER — ATROPINE SULFATE 1 MG/ML IV SOLN
0.5000 mg | Freq: Once | INTRAVENOUS | Status: AC | PRN
  Administered 2021-12-21: 0.5 mg via INTRAVENOUS
  Filled 2021-12-21: qty 1

## 2021-12-21 MED ORDER — SODIUM CHLORIDE 0.9 % IV SOLN
10.0000 mg | Freq: Once | INTRAVENOUS | Status: AC
  Administered 2021-12-21: 10 mg via INTRAVENOUS
  Filled 2021-12-21: qty 10

## 2021-12-21 MED ORDER — SODIUM CHLORIDE 0.9 % IV SOLN
125.0000 mg/m2 | Freq: Once | INTRAVENOUS | Status: AC
  Administered 2021-12-21: 220 mg via INTRAVENOUS
  Filled 2021-12-21: qty 10

## 2021-12-21 MED ORDER — SODIUM CHLORIDE 0.9 % IV SOLN
Freq: Once | INTRAVENOUS | Status: AC
  Filled 2021-12-21: qty 250

## 2021-12-21 MED ORDER — SODIUM CHLORIDE 0.9 % IV SOLN
6.0000 mg/kg | Freq: Once | INTRAVENOUS | Status: AC
  Administered 2021-12-21: 400 mg via INTRAVENOUS
  Filled 2021-12-21: qty 20

## 2021-12-21 MED ORDER — HEPARIN SOD (PORK) LOCK FLUSH 100 UNIT/ML IV SOLN
500.0000 [IU] | Freq: Once | INTRAVENOUS | Status: AC | PRN
  Administered 2021-12-21: 500 [IU]
  Filled 2021-12-21: qty 5

## 2021-12-21 NOTE — Progress Notes (Signed)
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 He currently smokes 0.5ppd. he presented to the ER with symptoms of left sided chest pain and left arm pain. Troponin was negative ekg was unremarkable. Ct chest showed no PE. He was incidentally noted to have mediastinal and retrocrural adenopathy and a 2.1X2.3X4.4 cm retroaortic soft tissue lesion in the posterior left chest. 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  Low-grade activity in the left lateral fifth and sixth ribs associated with nondisplaced fractures likely reflecting healing response problem malignancy.   Patient underwent CT-guided biopsy of the retroperitoneal lymph node pathology showed metastatic adenocarcinoma compatible with colorectal origin.  CK7 negative.  CK20 positive.  CDX 2+.  TTF-1 negative.  PSA negative.  This pattern of immunoreactivity supports the above diagnosis. Patient underwent colonoscopy which showed sigmoid mass that was consistent with adenocarcinoma.  RAS panel testing showed that he was wild-type for both K-ras and BRAF   FOLFOX and bevacizumab chemotherapy started in July 2020.  Subsequently oxaliplatin has been on hold given neuropathy.  Recent  scans have shown slow increase in the size of intra-abdominal adenopathy but patient continues to have low-volume disease.plan is to switch him to second line Xeloda irinotecan panitumumab  regimen  Interval history-tolerating Xeloda well.  Denies any skin rash nausea or diarrhea.  Tolerating chemotherapy well without any significant side effects.  Overall patient feels well and continues to gain weight.  Weight is up to 152 pounds from a prior number of 148 pounds.  Denies any rectal bleeding.  Bowel movements have been regular  ECOG PS- 1 Pain scale- 0 Opioid associated constipation- no  Review of systems- Review of Systems  Constitutional:  Negative for chills, fever, malaise/fatigue and weight loss.  HENT:  Negative for congestion, ear discharge and nosebleeds.   Eyes:  Negative for blurred vision.  Respiratory:  Negative for cough, hemoptysis, sputum production, shortness of breath and wheezing.   Cardiovascular:  Negative for chest pain, palpitations, orthopnea and claudication.  Gastrointestinal:  Negative for abdominal pain, blood in stool, constipation, diarrhea, heartburn, melena, nausea and vomiting.  Genitourinary:  Negative for dysuria, flank pain, frequency, hematuria and urgency.  Musculoskeletal:  Negative for back pain, joint pain and myalgias.  Skin:  Negative for rash.  Neurological:  Negative for dizziness, tingling, focal weakness, seizures, weakness and headaches.  Endo/Heme/Allergies:  Does not bruise/bleed easily.  Psychiatric/Behavioral:  Negative for depression and suicidal ideas. The patient does not have insomnia.       No Known Allergies   Past Medical History:  Diagnosis Date   Colon cancer ([place])    COPD (chronic obstructive pulmonary disease) ([place])    Hyperlipidemia      Past Surgical History:  Procedure Laterality Date   COLONOSCOPY WITH PROPOFOL N/A 06/26/2019   Procedure: COLONOSCOPY WITH  PROPOFOL;  Surgeon: [name], MD;  Location: [facility]  ENDOSCOPY;  Service: Gastroenterology;  Laterality: N/A;   FLEXIBLE SIGMOIDOSCOPY N/A 05/07/2021   Procedure: FLEXIBLE SIGMOIDOSCOPY;  Surgeon: [name], MD;  Location: [facility] ENDOSCOPY;  Service: Gastroenterology;  Laterality: N/A;   PORTA CATH INSERTION N/A 06/25/2019   Procedure: PORTA CATH INSERTION;  Surgeon: Algernon Huxley, MD;  Location: [place] [facility];  Service: Cardiovascular;  Laterality: N/A;   PORTA CATH INSERTION Left 08/11/2020   Procedure: PORTA CATH INSERTION;  Surgeon: Algernon Huxley, MD;  Location: [place] [facility];  Service: Cardiovascular;  Laterality: Left;   PORTA CATH REMOVAL Right 08/11/2020   Procedure: PORTA CATH REMOVAL;  Surgeon: Algernon Huxley, MD;  Location: [place] [facility];  Service: Cardiovascular;  Laterality: Right;    Social History   Socioeconomic History   Marital status: Single    Spouse name: Not on file   Number of children: Not on file   Years of education: Not on file   Highest education level: Not on file  Occupational History   Not on file  Tobacco Use   Smoking status: Every Day    Packs/day: 0.50    Years: 40.00    Pack years: 20.00    Types: Cigarettes   Smokeless tobacco: Never   Tobacco comments:    smoking 40 years  Vaping Use   Vaping Use: Never used  Substance and Sexual Activity   Alcohol use: No   Drug use: No   Sexual activity: Not Currently  Other Topics Concern   Not on file  Social History Narrative   Not on file   Social Determinants of Health   Financial Resource Strain: Not on file  Food Insecurity: Not on file  Transportation Needs: Not on file  Physical Activity: Not on file  Stress: Not on file  Social Connections: Not on file  Intimate Partner Violence: Not on file    Family History  Problem Relation Age of Onset   Brain cancer Father      Current Outpatient Medications:    allopurinol (ZYLOPRIM) 100 MG tablet, Take 100 mg by mouth daily., Disp: , Rfl:    aspirin EC 81 MG tablet, Take 81  mg by mouth daily., Disp: , Rfl:    azelastine (ASTELIN) 0.1 % nasal spray, Place 1 spray into both nostrils daily as needed., Disp: , Rfl:    butalbital-acetaminophen-caffeine (FIORICET) 50-325-40 MG tablet, Take 1 tablet by mouth every 4 (four) hours as needed., Disp: , Rfl:    capecitabine (XELODA) 500 MG tablet, Take 1 tablet (500 mg total) by mouth 2 (two) times daily after a meal. Take for 14 days, then hold for 7 days. Repeat every 21 days., Disp: 28 tablet, Rfl: 0   cetirizine (ZYRTEC) 10 MG tablet, Take 1 tablet (10 mg total) by mouth daily., Disp: 30 tablet, Rfl: 0   chlorthalidone (HYGROTON) 25 MG tablet, Take 1 tablet by mouth daily., Disp: , Rfl:    DEXILANT 60 MG capsule, Take 60 mg by mouth daily., Disp: , Rfl:    DULoxetine (CYMBALTA) 30 MG capsule, Take 1 capsule (30 mg total) by mouth 2 (two) times daily., Disp: 60 capsule, Rfl: 1   fluticasone (FLONASE) 50 MCG/ACT nasal spray, Place 2 sprays into both nostrils daily as needed., Disp: , Rfl:    fluticasone (VERAMYST) 27.5 MCG/SPRAY nasal spray, Place 2 sprays into the nose daily as needed., Disp: , Rfl:    lisinopril (ZESTRIL)  5 MG tablet, Take 5 mg by mouth daily., Disp: , Rfl:    loperamide (IMODIUM A-D) 2 MG tablet, Take 1 tablet (2 mg total) by mouth 4 (four) times daily as needed. Take 2 at diarrhea onset , then 1 every 2hr until 12hrs with no BM. May take 2 every 4hrs at night. If diarrhea recurs repeat., Disp: 100 tablet, Rfl: 1   magic mouthwash (multi-ingredient) oral suspension, Swish and spit 5-10 mls by mouth 4 times a day as needed, Disp: 480 mL, Rfl: 3   magic mouthwash w/lidocaine SOLN, Take 5-10 mLs by mouth 4 (four) times daily as needed for mouth pain., Disp: 480 mL, Rfl: 3   montelukast (SINGULAIR) 10 MG tablet, Take 10 mg by mouth at bedtime., Disp: , Rfl:    NEXLETOL 180 MG TABS, Take 1 tablet by mouth daily., Disp: , Rfl:    nicotine (NICODERM CQ - DOSED IN MG/24 HOURS) 21 mg/24hr patch, Place 1 patch onto the  skin 1 day or 1 dose., Disp: , Rfl:    oxyCODONE (OXY IR/ROXICODONE) 5 MG immediate release tablet, Take 1 tablet (5 mg total) by mouth every 6 (six) hours as needed for severe pain., Disp: 30 tablet, Rfl: 0   potassium chloride SA (KLOR-CON) 20 MEQ tablet, Take 1 tablet (20 mEq total) by mouth daily., Disp: 30 tablet, Rfl: 0   pregabalin (LYRICA) 75 MG capsule, Take 1 capsule (75 mg total) by mouth 2 (two) times daily., Disp: 60 capsule, Rfl: 2   simvastatin (ZOCOR) 20 MG tablet, Take 20 mg by mouth daily. , Disp: , Rfl:    SYMBICORT 80-4.5 MCG/ACT inhaler, Inhale 2 puffs into the lungs daily., Disp: , Rfl:    tamsulosin (FLOMAX) 0.4 MG CAPS capsule, Take 0.4 mg by mouth. , Disp: , Rfl:   Physical exam:  Vitals:   12/21/21 0856  BP: 122/73  Pulse: 77  Resp: 16  Temp: (!) 96 F (35.6 C)  TempSrc: Tympanic  SpO2: 98%  Weight: 152 lb (68.9 kg)  Height: '5\' 7"'  (1.702 m)   Physical Exam Cardiovascular:     Rate and Rhythm: Regular rhythm. Tachycardia present.     Heart sounds: Normal heart sounds.  Pulmonary:     Effort: Pulmonary effort is normal.     Breath sounds: Normal breath sounds.  Abdominal:     General: Bowel sounds are normal.     Palpations: Abdomen is soft.  Skin:    General: Skin is warm and dry.  Neurological:     Mental Status: He is alert and oriented to person, place, and time.     CMP Latest Ref Rng & Units 12/21/2021  Glucose 70 - 99 mg/dL 144(H)  BUN 8 - 23 mg/dL 24(H)  Creatinine 0.61 - 1.24 mg/dL 0.99  Sodium 135 - 145 mmol/L 134(L)  Potassium 3.5 - 5.1 mmol/L 3.3(L)  Chloride 98 - 111 mmol/L 102  CO2 22 - 32 mmol/L 23  Calcium 8.9 - 10.3 mg/dL 9.2  Total Protein 6.5 - 8.1 g/dL 7.4  Total Bilirubin 0.3 - 1.2 mg/dL 0.8  Alkaline Phos 38 - 126 U/L 106  AST 15 - 41 U/L 37  ALT 0 - 44 U/L 27   CBC Latest Ref Rng & Units 11/30/2021  WBC 4.0 - 10.5 K/uL 5.0  Hemoglobin 13.0 - 17.0 g/dL 13.4  Hematocrit 39.0 - 52.0 % 38.5(L)  Platelets 150 - 400 K/uL  97(L)    No images are attached to the encounter.  CT CHEST ABDOMEN PELVIS W CONTRAST  Result Date: 12/08/2021 CLINICAL DATA:  Metastatic colon cancer EXAM: CT CHEST, ABDOMEN, AND PELVIS WITH CONTRAST TECHNIQUE: Multidetector CT imaging of the chest, abdomen and pelvis was performed following the standard protocol during bolus administration of intravenous contrast. CONTRAST:  171m OMNIPAQUE IOHEXOL 300 MG/ML  SOLN COMPARISON:  CT chest, abdomen and pelvis dated July 16, 2021 FINDINGS: CT CHEST FINDINGS Cardiovascular: Normal heart size. No pericardial effusion. Mild LAD calcifications. Atherosclerotic disease of the thoracic aorta. Left chest wall port with tip in the upper right atrium. Mediastinum/Nodes: No pathologically enlarged lymph nodes seen in the chest. Patulous esophagus. Thyroid is unremarkable. Lungs/Pleura: Central airways are patent. Moderate centrilobular emphysema. Bibasilar atelectasis. Stable small solid pulmonary nodules. Reference solid nodule of the right lower lobe measuring 4 mm on series 100, image 96. reference solid pulmonary nodule measuring 3 mm on image 63. No new or enlarging pulmonary nodules. Musculoskeletal: No chest wall mass or suspicious bone lesions identified. CT ABDOMEN PELVIS FINDINGS Hepatobiliary: No focal liver abnormality is seen. No gallstones, gallbladder wall thickening, or biliary dilatation. Pancreas: Unremarkable. No pancreatic ductal dilatation or surrounding inflammatory changes. Spleen: Unchanged well splenomegaly. Adrenals/Urinary Tract: Adrenal glands are unremarkable. Kidneys are normal, without renal calculi, focal lesion, or hydronephrosis. Bladder is unremarkable. Stomach/Bowel: Stomach is within normal limits. Appendix appears normal. Mild diverticulosis. No evidence of bowel wall thickening, distention, or inflammatory changes. Vascular/Lymphatic: Interval decreased size of abdominopelvic lymph nodes. Reference aortocaval lymph node measuring 4  mm on series 2, image 69, previously 9 mm. Reference left external iliac lymph node measuring 8 mm in short axis on image 105, previously 1.3 cm. Reproductive: Mild prostatomegaly. Other: Small fat containing umbilical hernia. No abdominopelvic ascites. Fat stranding again seen about the IMA and left external iliac vessels, likely post treatment changes. Musculoskeletal: Chronic AVN of the femoral heads. No acute or significant osseous findings. IMPRESSION: 1. Interval decreased size of abdominopelvic adenopathy. 2. No evidence of metastatic disease in the chest. 3. Unchanged splenomegaly. 4. Aortic Atherosclerosis (ICD10-I70.0) and Emphysema (ICD10-J43.9). Electronically Signed   By: LYetta GlassmanM.D.   On: 12/08/2021 10:28     Assessment and plan- Patient is a 71y.o. male with metastatic K-ras wild-type colon cancer with intra-abdominal lymph node metastases.  He is here for on treatment assessment prior to cycle 8 of panitumumab irinotecan chemotherapy  Patient will continue Xeloda 1000 mg twice daily 2 weeks on 1 week off which she will start taking today again. He has mild chemo induced thrombocytopenia with a platelet count of fluctuated between 80s to 90s.  He will proceed with panitumumab and irinotecan today and I will see him back in 3 weeks for cycle 9.  I have reviewed CT chest abdomen and pelvis images independently and discussed findings with the patient which shows overall decrease in the size of intra-abdominal adenopathy.  Stable splenomegaly.  No evidence of malignancy in the chest.  Subcentimeter lung nodules overall stable.  Plan is to continue present regimen until progression or toxicity.     Visit Diagnosis 1. Encounter for antineoplastic chemotherapy   2. Encounter for monoclonal antibody treatment for malignancy      Dr. ARanda Evens MD, MPH [id number]/08/2022 8:40 AM

## 2021-12-21 NOTE — Patient Instructions (Signed)
[facility] AT [facility] [place]  Discharge Instructions: Thank you for choosing [facility] to provide your oncology and hematology care.  If you have a lab appointment with the [facility], please go directly to the [facility] and check in at the registration area.  Wear comfortable clothing and clothing appropriate for easy access to any Portacath or PICC line.   We strive to give you quality time with your provider. You may need to reschedule your appointment if you arrive late (15 or more minutes).  Arriving late affects you and other patients whose appointments are after yours.  Also, if you miss three or more appointments without notifying the office, you may be dismissed from the clinic at the providers discretion.      For prescription refill requests, have your pharmacy contact our office and allow 72 hours for refills to be completed.    Today you received the following chemotherapy and/or immunotherapy agents VECTIBIX and IRINOTRECAN      To help prevent nausea and vomiting after your treatment, we encourage you to take your nausea medication as directed.  BELOW ARE SYMPTOMS THAT SHOULD BE REPORTED IMMEDIATELY: *FEVER GREATER THAN 100.4 F (38 C) OR HIGHER *CHILLS OR SWEATING *NAUSEA AND VOMITING THAT IS NOT CONTROLLED WITH YOUR NAUSEA MEDICATION *UNUSUAL SHORTNESS OF BREATH *UNUSUAL BRUISING OR BLEEDING *URINARY PROBLEMS (pain or burning when urinating, or frequent urination) *BOWEL PROBLEMS (unusual diarrhea, constipation, pain near the anus) TENDERNESS IN MOUTH AND THROAT WITH OR WITHOUT PRESENCE OF ULCERS (sore throat, sores in mouth, or a toothache) UNUSUAL RASH, SWELLING OR PAIN  UNUSUAL VAGINAL DISCHARGE OR ITCHING   Items with * indicate a potential emergency and should be followed up as soon as possible or go to the Emergency Department if any problems should occur.  Please show the CHEMOTHERAPY ALERT CARD or IMMUNOTHERAPY ALERT CARD  at check-in to the Emergency Department and triage nurse.  Should you have questions after your visit or need to cancel or reschedule your appointment, please contact [facility] [place] AT [place] [place]  [phone] and follow the prompts.  Office hours are 8:00 a.m. to 4:30 p.m. Monday - Friday. Please note that voicemails left after 4:00 p.m. may not be returned until the following business day.  We are closed weekends and major holidays. You have access to a nurse at all times for urgent questions. Please call the main number to the clinic [phone] and follow the prompts.  For any non-urgent questions, you may also contact your provider using MyChart. We now offer e-Visits for anyone 13 and older to request care online for non-urgent symptoms. For details visit [email or web address].   Also download the MyChart app! Go to the app store, search "MyChart", open the app, select [facility], and log in with your MyChart username and password.  Due to Covid, a mask is required upon entering the hospital/clinic. If you do not have a mask, one will be given to you upon arrival. For doctor visits, patients may have 1 support person aged 43 or older with them. For treatment visits, patients cannot have anyone with them due to current Covid guidelines and our immunocompromised population.   Panitumumab Solution for Injection What is this medication? PANITUMUMAB (pan i TOOM ue mab) is a monoclonal antibody. It is used to treat colorectal cancer. This medicine may be used for other purposes; ask your health care provider or pharmacist if you have questions. COMMON BRAND NAME(S): Vectibix What should I tell my  care team before I take this medication? They need to know if you have any of these conditions: eye disease, vision problems low levels of calcium, magnesium, or potassium in the blood lung or breathing disease, like asthma skin conditions or sensitivity an unusual or allergic  reaction to panitumumab, other medicines, foods, dyes, or preservatives pregnant or trying to get pregnant breast-feeding How should I use this medication? This drug is given as an infusion into a vein. It is administered in a hospital or clinic by a specially trained health care professional. Talk to your pediatrician regarding the use of this medicine in children. Special care may be needed. Overdosage: If you think you have taken too much of this medicine contact a poison control center or emergency room at once. NOTE: This medicine is only for you. Do not share this medicine with others. What if I miss a dose? It is important not to miss your dose. Call your doctor or health care professional if you are unable to keep an appointment. What may interact with this medication? Do not take this medicine with any of the following medications: bevacizumab This list may not describe all possible interactions. Give your health care provider a list of all the medicines, herbs, non-prescription drugs, or dietary supplements you use. Also tell them if you smoke, drink alcohol, or use illegal drugs. Some items may interact with your medicine. What should I watch for while using this medication? Visit your doctor for checks on your progress. This drug may make you feel generally unwell. This is not uncommon, as chemotherapy can affect healthy cells as well as cancer cells. Report any side effects. Continue your course of treatment even though you feel ill unless your doctor tells you to stop. This medicine can make you more sensitive to the sun. Keep out of the sun while receiving this medicine and for 2 months after the last dose. If you cannot avoid being in the sun, wear protective clothing and use sunscreen. Do not use sun lamps or tanning beds/booths. In some cases, you may be given additional medicines to help with side effects. Follow all directions for their use. Call your doctor or health care  professional for advice if you get a fever, chills or sore throat, or other symptoms of a cold or flu. Do not treat yourself. This drug decreases your body's ability to fight infections. Try to avoid being around people who are sick. Avoid taking products that contain aspirin, acetaminophen, ibuprofen, naproxen, or ketoprofen unless instructed by your doctor. These medicines may hide a fever. Do not become pregnant while taking this medicine and for 2 months after the last dose. Women should inform their doctor if they wish to become pregnant or think they might be pregnant. There is a potential for serious side effects to an unborn child. Talk to your health care professional or pharmacist for more information. Do not breast-feed an infant while taking this medicine or for 2 months after the last dose. What side effects may I notice from receiving this medication? Side effects that you should report to your doctor or health care professional as soon as possible: allergic reactions like skin rash, itching or hives, swelling of the face, lips, or tongue breathing problems changes in vision eye pain fast, irregular heartbeat fever, chills mouth sores red spots on the skin redness, blistering, peeling or loosening of the skin, including inside the mouth signs and symptoms of kidney injury like trouble passing urine or change in  the amount of urine signs and symptoms of low blood pressure like dizziness; feeling faint or lightheaded, falls; unusually weak or tired signs of low calcium like fast heartbeat, muscle cramps or muscle pain; pain, tingling, numbness in the hands or feet; seizures signs and symptoms of low magnesium like muscle cramps, pain, or weakness; tremors; seizures; or fast, irregular heartbeat signs and symptoms of low potassium like muscle cramps or muscle pain; chest pain; dizziness; feeling faint or lightheaded, falls; palpitations; breathing problems; or fast, irregular  heartbeat swelling of the ankles, feet, hands Side effects that usually do not require medical attention (report to your doctor or health care professional if they continue or are bothersome): changes in skin like acne, cracks, skin dryness diarrhea eyelash growth headache mouth sores nail changes nausea, vomiting This list may not describe all possible side effects. Call your doctor for medical advice about side effects. You may report side effects to FDA at 1-800-FDA-1088. Where should I keep my medication? This drug is given in a hospital or clinic and will not be stored at home. NOTE: This sheet is a summary. It may not cover all possible information. If you have questions about this medicine, talk to your doctor, pharmacist, or health care provider.  2022 Elsevier/Gold Standard (2016-06-25 00:00:00)  Irinotecan injection What is this medication? IRINOTECAN (ir in oh TEE kan ) is a chemotherapy drug. It is used to treat colon and rectal cancer. This medicine may be used for other purposes; ask your health care provider or pharmacist if you have questions. COMMON BRAND NAME(S): Camptosar What should I tell my care team before I take this medication? They need to know if you have any of these conditions: dehydration diarrhea infection (especially a virus infection such as chickenpox, cold sores, or herpes) liver disease low blood counts, like low white cell, platelet, or red cell counts low levels of calcium, magnesium, or potassium in the blood recent or ongoing radiation therapy an unusual or allergic reaction to irinotecan, other medicines, foods, dyes, or preservatives pregnant or trying to get pregnant breast-feeding How should I use this medication? This drug is given as an infusion into a vein. It is administered in a hospital or clinic by a specially trained health care professional. Talk to your pediatrician regarding the use of this medicine in children. Special care  may be needed. Overdosage: If you think you have taken too much of this medicine contact a poison control center or emergency room at once. NOTE: This medicine is only for you. Do not share this medicine with others. What if I miss a dose? It is important not to miss your dose. Call your doctor or health care professional if you are unable to keep an appointment. What may interact with this medication? Do not take this medicine with any of the following medications: cobicistat itraconazole This medicine may interact with the following medications: antiviral medicines for HIV or AIDS certain antibiotics like rifampin or rifabutin certain medicines for fungal infections like ketoconazole, posaconazole, and voriconazole certain medicines for seizures like carbamazepine, phenobarbital, phenotoin clarithromycin gemfibrozil nefazodone [facility]. John's Wort This list may not describe all possible interactions. Give your health care provider a list of all the medicines, herbs, non-prescription drugs, or dietary supplements you use. Also tell them if you smoke, drink alcohol, or use illegal drugs. Some items may interact with your medicine. What should I watch for while using this medication? Your condition will be monitored carefully while you are receiving this medicine.  You will need important blood work done while you are taking this medicine. This drug may make you feel generally unwell. This is not uncommon, as chemotherapy can affect healthy cells as well as cancer cells. Report any side effects. Continue your course of treatment even though you feel ill unless your doctor tells you to stop. In some cases, you may be given additional medicines to help with side effects. Follow all directions for their use. You may get drowsy or dizzy. Do not drive, use machinery, or do anything that needs mental alertness until you know how this medicine affects you. Do not stand or sit up quickly, especially if you  are an older patient. This reduces the risk of dizzy or fainting spells. Call your health care professional for advice if you get a fever, chills, or sore throat, or other symptoms of a cold or flu. Do not treat yourself. This medicine decreases your body's ability to fight infections. Try to avoid being around people who are sick. Avoid taking products that contain aspirin, acetaminophen, ibuprofen, naproxen, or ketoprofen unless instructed by your doctor. These medicines may hide a fever. This medicine may increase your risk to bruise or bleed. Call your doctor or health care professional if you notice any unusual bleeding. Be careful brushing and flossing your teeth or using a toothpick because you may get an infection or bleed more easily. If you have any dental work done, tell your dentist you are receiving this medicine. Do not become pregnant while taking this medicine or for 6 months after stopping it. Women should inform their health care professional if they wish to become pregnant or think they might be pregnant. Men should not father a child while taking this medicine and for 3 months after stopping it. There is potential for serious side effects to an unborn child. Talk to your health care professional for more information. Do not breast-feed an infant while taking this medicine or for 7 days after stopping it. This medicine has caused ovarian failure in some women. This medicine may make it more difficult to get pregnant. Talk to your health care professional if you are concerned about your fertility. This medicine has caused decreased sperm counts in some men. This may make it more difficult to father a child. Talk to your health care professional if you are concerned about your fertility. What side effects may I notice from receiving this medication? Side effects that you should report to your doctor or health care professional as soon as possible: allergic reactions like skin rash,  itching or hives, swelling of the face, lips, or tongue chest pain diarrhea flushing, runny nose, sweating during infusion low blood counts - this medicine may decrease the number of white blood cells, red blood cells and platelets. You may be at increased risk for infections and bleeding. nausea, vomiting pain, swelling, warmth in the leg signs of decreased platelets or bleeding - bruising, pinpoint red spots on the skin, black, tarry stools, blood in the urine signs of infection - fever or chills, cough, sore throat, pain or difficulty passing urine signs of decreased red blood cells - unusually weak or tired, fainting spells, lightheadedness Side effects that usually do not require medical attention (report to your doctor or health care professional if they continue or are bothersome): constipation hair loss headache loss of appetite mouth sores stomach pain This list may not describe all possible side effects. Call your doctor for medical advice about side effects. You may report  side effects to FDA at 1-800-FDA-1088. Where should I keep my medication? This drug is given in a hospital or clinic and will not be stored at home. NOTE: This sheet is a summary. It may not cover all possible information. If you have questions about this medicine, talk to your doctor, pharmacist, or health care provider.  2022 Elsevier/Gold Standard (2021-08-18 00:00:00)

## 2021-12-21 NOTE — Progress Notes (Signed)
Plts 90.  Per Dr [name] ok to proceed with treatment

## 2021-12-31 DIAGNOSIS — E785 Hyperlipidemia, unspecified: Secondary | ICD-10-CM | POA: Diagnosis not present

## 2021-12-31 DIAGNOSIS — I10 Essential (primary) hypertension: Secondary | ICD-10-CM | POA: Diagnosis not present

## 2022-01-04 ENCOUNTER — Other Ambulatory Visit (HOSPITAL_COMMUNITY): Payer: Self-pay

## 2022-01-04 ENCOUNTER — Other Ambulatory Visit: Payer: Self-pay | Admitting: Oncology

## 2022-01-04 DIAGNOSIS — C189 Malignant neoplasm of colon, unspecified: Secondary | ICD-10-CM

## 2022-01-04 MED ORDER — CAPECITABINE 500 MG PO TABS
500.0000 mg | ORAL_TABLET | Freq: Two times a day (BID) | ORAL | 0 refills | Status: DC
  Filled 2022-01-04: qty 28, 21d supply, fill #0

## 2022-01-05 DIAGNOSIS — Z1331 Encounter for screening for depression: Secondary | ICD-10-CM | POA: Diagnosis not present

## 2022-01-05 DIAGNOSIS — J019 Acute sinusitis, unspecified: Secondary | ICD-10-CM | POA: Diagnosis not present

## 2022-01-05 DIAGNOSIS — F17200 Nicotine dependence, unspecified, uncomplicated: Secondary | ICD-10-CM | POA: Diagnosis not present

## 2022-01-05 DIAGNOSIS — I2510 Atherosclerotic heart disease of native coronary artery without angina pectoris: Secondary | ICD-10-CM | POA: Diagnosis not present

## 2022-01-05 DIAGNOSIS — Z0001 Encounter for general adult medical examination with abnormal findings: Secondary | ICD-10-CM | POA: Diagnosis not present

## 2022-01-05 DIAGNOSIS — E790 Hyperuricemia without signs of inflammatory arthritis and tophaceous disease: Secondary | ICD-10-CM | POA: Diagnosis not present

## 2022-01-05 DIAGNOSIS — J449 Chronic obstructive pulmonary disease, unspecified: Secondary | ICD-10-CM | POA: Diagnosis not present

## 2022-01-05 DIAGNOSIS — K3189 Other diseases of stomach and duodenum: Secondary | ICD-10-CM | POA: Diagnosis not present

## 2022-01-05 DIAGNOSIS — I10 Essential (primary) hypertension: Secondary | ICD-10-CM | POA: Diagnosis not present

## 2022-01-06 ENCOUNTER — Other Ambulatory Visit (HOSPITAL_COMMUNITY): Payer: Self-pay

## 2022-01-11 ENCOUNTER — Other Ambulatory Visit: Payer: Self-pay

## 2022-01-11 ENCOUNTER — Inpatient Hospital Stay: Payer: Medicare HMO

## 2022-01-11 ENCOUNTER — Inpatient Hospital Stay (HOSPITAL_BASED_OUTPATIENT_CLINIC_OR_DEPARTMENT_OTHER): Payer: Medicare HMO | Admitting: Oncology

## 2022-01-11 ENCOUNTER — Encounter: Payer: Self-pay | Admitting: Oncology

## 2022-01-11 VITALS — BP 119/69 | HR 88 | Temp 97.9°F | Resp 16 | Ht 67.0 in | Wt 151.8 lb

## 2022-01-11 DIAGNOSIS — C772 Secondary and unspecified malignant neoplasm of intra-abdominal lymph nodes: Secondary | ICD-10-CM | POA: Diagnosis not present

## 2022-01-11 DIAGNOSIS — Z79899 Other long term (current) drug therapy: Secondary | ICD-10-CM | POA: Diagnosis not present

## 2022-01-11 DIAGNOSIS — G620 Drug-induced polyneuropathy: Secondary | ICD-10-CM

## 2022-01-11 DIAGNOSIS — C189 Malignant neoplasm of colon, unspecified: Secondary | ICD-10-CM | POA: Diagnosis not present

## 2022-01-11 DIAGNOSIS — T451X5A Adverse effect of antineoplastic and immunosuppressive drugs, initial encounter: Secondary | ICD-10-CM | POA: Diagnosis not present

## 2022-01-11 DIAGNOSIS — F17210 Nicotine dependence, cigarettes, uncomplicated: Secondary | ICD-10-CM | POA: Diagnosis not present

## 2022-01-11 DIAGNOSIS — C778 Secondary and unspecified malignant neoplasm of lymph nodes of multiple regions: Secondary | ICD-10-CM | POA: Diagnosis not present

## 2022-01-11 DIAGNOSIS — Z5111 Encounter for antineoplastic chemotherapy: Secondary | ICD-10-CM

## 2022-01-11 DIAGNOSIS — Z5112 Encounter for antineoplastic immunotherapy: Secondary | ICD-10-CM

## 2022-01-11 DIAGNOSIS — C187 Malignant neoplasm of sigmoid colon: Secondary | ICD-10-CM | POA: Diagnosis not present

## 2022-01-11 LAB — COMPREHENSIVE METABOLIC PANEL
ALT: 21 U/L (ref 0–44)
AST: 31 U/L (ref 15–41)
Albumin: 3.9 g/dL (ref 3.5–5.0)
Alkaline Phosphatase: 97 U/L (ref 38–126)
Anion gap: 9 (ref 5–15)
BUN: 31 mg/dL — ABNORMAL HIGH (ref 8–23)
CO2: 22 mmol/L (ref 22–32)
Calcium: 9.2 mg/dL (ref 8.9–10.3)
Chloride: 104 mmol/L (ref 98–111)
Creatinine, Ser: 1.11 mg/dL (ref 0.61–1.24)
GFR, Estimated: >60 mL/min (ref >60)
Glucose, Bld: 168 mg/dL — ABNORMAL HIGH (ref 70–99)
Potassium: 3.2 mmol/L — ABNORMAL LOW (ref 3.5–5.1)
Sodium: 135 mmol/L (ref 135–145)
Total Bilirubin: 0.6 mg/dL (ref 0.3–1.2)
Total Protein: 7.2 g/dL (ref 6.5–8.1)

## 2022-01-11 LAB — CBC WITH DIFFERENTIAL/PLATELET
Abs Immature Granulocytes: 0.06 10*3/uL (ref 0.00–0.07)
Basophils Absolute: 0.1 10*3/uL (ref 0.0–0.1)
Basophils Relative: 1 %
Eosinophils Absolute: 0.2 10*3/uL (ref 0.0–0.5)
Eosinophils Relative: 3 %
HCT: 38.4 % — ABNORMAL LOW (ref 39.0–52.0)
Hemoglobin: 13.5 g/dL (ref 13.0–17.0)
Immature Granulocytes: 1 %
Lymphocytes Relative: 30 %
Lymphs Abs: 1.7 10*3/uL (ref 0.7–4.0)
MCH: 33.6 pg (ref 26.0–34.0)
MCHC: 35.2 g/dL (ref 30.0–36.0)
MCV: 95.5 fL (ref 80.0–100.0)
Monocytes Absolute: 0.5 10*3/uL (ref 0.1–1.0)
Monocytes Relative: 8 %
Neutro Abs: 3.3 10*3/uL (ref 1.7–7.7)
Neutrophils Relative %: 57 %
Platelets: 102 10*3/uL — ABNORMAL LOW (ref 150–400)
RBC: 4.02 MIL/uL — ABNORMAL LOW (ref 4.22–5.81)
RDW: 15.9 % — ABNORMAL HIGH (ref 11.5–15.5)
WBC: 5.7 10*3/uL (ref 4.0–10.5)
nRBC: 0 % (ref 0.0–0.2)

## 2022-01-11 LAB — MAGNESIUM: Magnesium: 1.8 mg/dL (ref 1.7–2.4)

## 2022-01-11 MED ORDER — SODIUM CHLORIDE 0.9 % IV SOLN
6.0000 mg/kg | Freq: Once | INTRAVENOUS | Status: AC
  Administered 2022-01-11: 400 mg via INTRAVENOUS
  Filled 2022-01-11: qty 20

## 2022-01-11 MED ORDER — SODIUM CHLORIDE 0.9 % IV SOLN
Freq: Once | INTRAVENOUS | Status: AC
  Filled 2022-01-11: qty 250

## 2022-01-11 MED ORDER — ATROPINE SULFATE 1 MG/ML IV SOLN
0.5000 mg | Freq: Once | INTRAVENOUS | Status: AC | PRN
  Administered 2022-01-11: 0.5 mg via INTRAVENOUS
  Filled 2022-01-11: qty 1

## 2022-01-11 MED ORDER — SODIUM CHLORIDE 0.9 % IV SOLN
125.0000 mg/m2 | Freq: Once | INTRAVENOUS | Status: AC
  Administered 2022-01-11: 220 mg via INTRAVENOUS
  Filled 2022-01-11: qty 10

## 2022-01-11 MED ORDER — HEPARIN SOD (PORK) LOCK FLUSH 100 UNIT/ML IV SOLN
500.0000 [IU] | Freq: Once | INTRAVENOUS | Status: AC | PRN
  Administered 2022-01-11: 500 [IU]
  Filled 2022-01-11: qty 5

## 2022-01-11 MED ORDER — PALONOSETRON HCL INJECTION 0.25 MG/5ML
0.2500 mg | Freq: Once | INTRAVENOUS | Status: AC
  Administered 2022-01-11: 0.25 mg via INTRAVENOUS
  Filled 2022-01-11: qty 5

## 2022-01-11 MED ORDER — SODIUM CHLORIDE 0.9 % IV SOLN
10.0000 mg | Freq: Once | INTRAVENOUS | Status: AC
  Administered 2022-01-11: 10 mg via INTRAVENOUS
  Filled 2022-01-11: qty 10

## 2022-01-11 MED ORDER — POTASSIUM CHLORIDE CRYS ER 20 MEQ PO TBCR: 20.0000 meq | EXTENDED_RELEASE_TABLET | Freq: Every day | ORAL | 0 refills | Status: DC

## 2022-01-11 NOTE — Patient Instructions (Signed)
[facility] [place]  Discharge Instructions: Thank you for choosing [facility] to provide your oncology and hematology care.  If you have a lab appointment with the [facility], please go directly to the [facility] and check in at the registration area.  Wear comfortable clothing and clothing appropriate for easy access to any Portacath or PICC line.   We strive to give you quality time with your provider. You may need to reschedule your appointment if you arrive late (15 or more minutes).  Arriving late affects you and other patients whose appointments are after yours.  Also, if you miss three or more appointments without notifying the office, you may be dismissed from the clinic at the providers discretion.      For prescription refill requests, have your pharmacy contact our office and allow 72 hours for refills to be completed.    Today you received the following chemotherapy and/or immunotherapy agents VECTBIX and IRINOTECAN      To help prevent nausea and vomiting after your treatment, we encourage you to take your nausea medication as directed.  BELOW ARE SYMPTOMS THAT SHOULD BE REPORTED IMMEDIATELY: *FEVER GREATER THAN 100.4 F (38 C) OR HIGHER *CHILLS OR SWEATING *NAUSEA AND VOMITING THAT IS NOT CONTROLLED WITH YOUR NAUSEA MEDICATION *UNUSUAL SHORTNESS OF BREATH *UNUSUAL BRUISING OR BLEEDING *URINARY PROBLEMS (pain or burning when urinating, or frequent urination) *BOWEL PROBLEMS (unusual diarrhea, constipation, pain near the anus) TENDERNESS IN MOUTH AND THROAT WITH OR WITHOUT PRESENCE OF ULCERS (sore throat, sores in mouth, or a toothache) UNUSUAL RASH, SWELLING OR PAIN  UNUSUAL VAGINAL DISCHARGE OR ITCHING   Items with * indicate a potential emergency and should be followed up as soon as possible or go to the Emergency Department if any problems should occur.  Please show the CHEMOTHERAPY ALERT CARD or IMMUNOTHERAPY ALERT CARD at  check-in to the Emergency Department and triage nurse.  Should you have questions after your visit or need to cancel or reschedule your appointment, please contact [facility] [place]  [phone] and follow the prompts.  Office hours are 8:00 a.m. to 4:30 p.m. Monday - Friday. Please note that voicemails left after 4:00 p.m. may not be returned until the following business day.  We are closed weekends and major holidays. You have access to a nurse at all times for urgent questions. Please call the main number to the clinic [phone] and follow the prompts.  For any non-urgent questions, you may also contact your provider using MyChart. We now offer e-Visits for anyone 82 and older to request care online for non-urgent symptoms. For details visit [email or web address].   Also download the MyChart app! Go to the app store, search "MyChart", open the app, select [facility], and log in with your MyChart username and password.  Due to Covid, a mask is required upon entering the hospital/clinic. If you do not have a mask, one will be given to you upon arrival. For doctor visits, patients may have 1 support person aged 59 or older with them. For treatment visits, patients cannot have anyone with them due to current Covid guidelines and our immunocompromised population.   Panitumumab Solution for Injection What is this medication? PANITUMUMAB (pan i TOOM ue mab) is a monoclonal antibody. It is used to treat colorectal cancer. This medicine may be used for other purposes; ask your health care provider or pharmacist if you have questions. COMMON BRAND NAME(S): Vectibix What should I tell my  care team before I take this medication? They need to know if you have any of these conditions: eye disease, vision problems low levels of calcium, magnesium, or potassium in the blood lung or breathing disease, like asthma skin conditions or sensitivity an unusual or allergic reaction  to panitumumab, other medicines, foods, dyes, or preservatives pregnant or trying to get pregnant breast-feeding How should I use this medication? This drug is given as an infusion into a vein. It is administered in a hospital or clinic by a specially trained health care professional. Talk to your pediatrician regarding the use of this medicine in children. Special care may be needed. Overdosage: If you think you have taken too much of this medicine contact a poison control center or emergency room at once. NOTE: This medicine is only for you. Do not share this medicine with others. What if I miss a dose? It is important not to miss your dose. Call your doctor or health care professional if you are unable to keep an appointment. What may interact with this medication? Do not take this medicine with any of the following medications: bevacizumab This list may not describe all possible interactions. Give your health care provider a list of all the medicines, herbs, non-prescription drugs, or dietary supplements you use. Also tell them if you smoke, drink alcohol, or use illegal drugs. Some items may interact with your medicine. What should I watch for while using this medication? Visit your doctor for checks on your progress. This drug may make you feel generally unwell. This is not uncommon, as chemotherapy can affect healthy cells as well as cancer cells. Report any side effects. Continue your course of treatment even though you feel ill unless your doctor tells you to stop. This medicine can make you more sensitive to the sun. Keep out of the sun while receiving this medicine and for 2 months after the last dose. If you cannot avoid being in the sun, wear protective clothing and use sunscreen. Do not use sun lamps or tanning beds/booths. In some cases, you may be given additional medicines to help with side effects. Follow all directions for their use. Call your doctor or health care professional  for advice if you get a fever, chills or sore throat, or other symptoms of a cold or flu. Do not treat yourself. This drug decreases your body's ability to fight infections. Try to avoid being around people who are sick. Avoid taking products that contain aspirin, acetaminophen, ibuprofen, naproxen, or ketoprofen unless instructed by your doctor. These medicines may hide a fever. Do not become pregnant while taking this medicine and for 2 months after the last dose. Women should inform their doctor if they wish to become pregnant or think they might be pregnant. There is a potential for serious side effects to an unborn child. Talk to your health care professional or pharmacist for more information. Do not breast-feed an infant while taking this medicine or for 2 months after the last dose. What side effects may I notice from receiving this medication? Side effects that you should report to your doctor or health care professional as soon as possible: allergic reactions like skin rash, itching or hives, swelling of the face, lips, or tongue breathing problems changes in vision eye pain fast, irregular heartbeat fever, chills mouth sores red spots on the skin redness, blistering, peeling or loosening of the skin, including inside the mouth signs and symptoms of kidney injury like trouble passing urine or change in  the amount of urine signs and symptoms of low blood pressure like dizziness; feeling faint or lightheaded, falls; unusually weak or tired signs of low calcium like fast heartbeat, muscle cramps or muscle pain; pain, tingling, numbness in the hands or feet; seizures signs and symptoms of low magnesium like muscle cramps, pain, or weakness; tremors; seizures; or fast, irregular heartbeat signs and symptoms of low potassium like muscle cramps or muscle pain; chest pain; dizziness; feeling faint or lightheaded, falls; palpitations; breathing problems; or fast, irregular heartbeat swelling of  the ankles, feet, hands Side effects that usually do not require medical attention (report to your doctor or health care professional if they continue or are bothersome): changes in skin like acne, cracks, skin dryness diarrhea eyelash growth headache mouth sores nail changes nausea, vomiting This list may not describe all possible side effects. Call your doctor for medical advice about side effects. You may report side effects to FDA at 1-800-FDA-1088. Where should I keep my medication? This drug is given in a hospital or clinic and will not be stored at home. NOTE: This sheet is a summary. It may not cover all possible information. If you have questions about this medicine, talk to your doctor, pharmacist, or health care provider.  2022 Elsevier/Gold Standard (2016-06-25 00:00:00)  Irinotecan injection What is this medication? IRINOTECAN (ir in oh TEE kan ) is a chemotherapy drug. It is used to treat colon and rectal cancer. This medicine may be used for other purposes; ask your health care provider or pharmacist if you have questions. COMMON BRAND NAME(S): Camptosar What should I tell my care team before I take this medication? They need to know if you have any of these conditions: dehydration diarrhea infection (especially a virus infection such as chickenpox, cold sores, or herpes) liver disease low blood counts, like low white cell, platelet, or red cell counts low levels of calcium, magnesium, or potassium in the blood recent or ongoing radiation therapy an unusual or allergic reaction to irinotecan, other medicines, foods, dyes, or preservatives pregnant or trying to get pregnant breast-feeding How should I use this medication? This drug is given as an infusion into a vein. It is administered in a hospital or clinic by a specially trained health care professional. Talk to your pediatrician regarding the use of this medicine in children. Special care may be  needed. Overdosage: If you think you have taken too much of this medicine contact a poison control center or emergency room at once. NOTE: This medicine is only for you. Do not share this medicine with others. What if I miss a dose? It is important not to miss your dose. Call your doctor or health care professional if you are unable to keep an appointment. What may interact with this medication? Do not take this medicine with any of the following medications: cobicistat itraconazole This medicine may interact with the following medications: antiviral medicines for HIV or AIDS certain antibiotics like rifampin or rifabutin certain medicines for fungal infections like ketoconazole, posaconazole, and voriconazole certain medicines for seizures like carbamazepine, phenobarbital, phenotoin clarithromycin gemfibrozil nefazodone St. John's Wort This list may not describe all possible interactions. Give your health care provider a list of all the medicines, herbs, non-prescription drugs, or dietary supplements you use. Also tell them if you smoke, drink alcohol, or use illegal drugs. Some items may interact with your medicine. What should I watch for while using this medication? Your condition will be monitored carefully while you are receiving this medicine.  You will need important blood work done while you are taking this medicine. This drug may make you feel generally unwell. This is not uncommon, as chemotherapy can affect healthy cells as well as cancer cells. Report any side effects. Continue your course of treatment even though you feel ill unless your doctor tells you to stop. In some cases, you may be given additional medicines to help with side effects. Follow all directions for their use. You may get drowsy or dizzy. Do not drive, use machinery, or do anything that needs mental alertness until you know how this medicine affects you. Do not stand or sit up quickly, especially if you are an  older patient. This reduces the risk of dizzy or fainting spells. Call your health care professional for advice if you get a fever, chills, or sore throat, or other symptoms of a cold or flu. Do not treat yourself. This medicine decreases your body's ability to fight infections. Try to avoid being around people who are sick. Avoid taking products that contain aspirin, acetaminophen, ibuprofen, naproxen, or ketoprofen unless instructed by your doctor. These medicines may hide a fever. This medicine may increase your risk to bruise or bleed. Call your doctor or health care professional if you notice any unusual bleeding. Be careful brushing and flossing your teeth or using a toothpick because you may get an infection or bleed more easily. If you have any dental work done, tell your dentist you are receiving this medicine. Do not become pregnant while taking this medicine or for 6 months after stopping it. Women should inform their health care professional if they wish to become pregnant or think they might be pregnant. Men should not father a child while taking this medicine and for 3 months after stopping it. There is potential for serious side effects to an unborn child. Talk to your health care professional for more information. Do not breast-feed an infant while taking this medicine or for 7 days after stopping it. This medicine has caused ovarian failure in some women. This medicine may make it more difficult to get pregnant. Talk to your health care professional if you are concerned about your fertility. This medicine has caused decreased sperm counts in some men. This may make it more difficult to father a child. Talk to your health care professional if you are concerned about your fertility. What side effects may I notice from receiving this medication? Side effects that you should report to your doctor or health care professional as soon as possible: allergic reactions like skin rash, itching or  hives, swelling of the face, lips, or tongue chest pain diarrhea flushing, runny nose, sweating during infusion low blood counts - this medicine may decrease the number of white blood cells, red blood cells and platelets. You may be at increased risk for infections and bleeding. nausea, vomiting pain, swelling, warmth in the leg signs of decreased platelets or bleeding - bruising, pinpoint red spots on the skin, black, tarry stools, blood in the urine signs of infection - fever or chills, cough, sore throat, pain or difficulty passing urine signs of decreased red blood cells - unusually weak or tired, fainting spells, lightheadedness Side effects that usually do not require medical attention (report to your doctor or health care professional if they continue or are bothersome): constipation hair loss headache loss of appetite mouth sores stomach pain This list may not describe all possible side effects. Call your doctor for medical advice about side effects. You may report  side effects to FDA at 1-800-FDA-1088. Where should I keep my medication? This drug is given in a hospital or clinic and will not be stored at home. NOTE: This sheet is a summary. It may not cover all possible information. If you have questions about this medicine, talk to your doctor, pharmacist, or health care provider.  2022 Elsevier/Gold Standard (2021-08-18 00:00:00)

## 2022-01-11 NOTE — Progress Notes (Signed)
No concerns, started the capecitabine today.

## 2022-01-11 NOTE — Progress Notes (Signed)
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 He currently smokes 0.5ppd. he presented to the ER with symptoms of left sided chest pain and left arm pain. Troponin was negative ekg was unremarkable. Ct chest showed no PE. He was incidentally noted to have mediastinal and retrocrural adenopathy and a 2.1X2.3X4.4 cm retroaortic soft tissue lesion in the posterior left chest. 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  Low-grade activity in the left lateral fifth and sixth ribs associated with nondisplaced fractures likely reflecting healing response problem malignancy.   Patient underwent CT-guided biopsy of the retroperitoneal lymph node pathology showed metastatic adenocarcinoma compatible with colorectal origin.  CK7 negative.  CK20 positive.  CDX 2+.  TTF-1 negative.  PSA negative.  This pattern of immunoreactivity supports the above diagnosis. Patient underwent colonoscopy which showed sigmoid mass that was consistent with adenocarcinoma.  RAS panel testing showed that he was wild-type for both K-ras and BRAF   FOLFOX and bevacizumab chemotherapy started in July 2020.  Subsequently oxaliplatin has been on hold given neuropathy.   Recent scans have shown slow increase in the size of intra-abdominal adenopathy but patient continues to have low-volume disease.plan is to switch him to second line Xeloda irinotecan panitumumab  regimen    Interval history-he is tolerating treatments well so far.  Reports feeling well and denies any specific complaints at this time.  Neuropathy in his feet is stable.  Denies any rectal bleeding  ECOG PS- 1 Pain scale- 0   Review of systems- Review of Systems  Constitutional:  Negative for chills, fever, malaise/fatigue and weight loss.  HENT:  Negative for congestion, ear discharge and nosebleeds.   Eyes:  Negative for blurred vision.  Respiratory:  Negative for cough, hemoptysis, sputum production, shortness of breath and wheezing.   Cardiovascular:  Negative for chest pain, palpitations, orthopnea and claudication.  Gastrointestinal:  Negative for abdominal pain, blood in stool, constipation, diarrhea, heartburn, melena, nausea and vomiting.  Genitourinary:  Negative for dysuria, flank pain, frequency, hematuria and urgency.  Musculoskeletal:  Negative for back pain, joint pain and myalgias.  Skin:  Negative for rash.  Neurological:  Positive for sensory change (Peripheral neuropathy). Negative for dizziness, tingling, focal weakness, seizures, weakness and headaches.  Endo/Heme/Allergies:  Does not bruise/bleed easily.  Psychiatric/Behavioral:  Negative for depression and suicidal ideas. The patient does not have insomnia.       No Known Allergies   Past Medical History:  Diagnosis Date   Colon cancer ([place])    COPD (chronic obstructive pulmonary disease) ([place])    Hyperlipidemia      Past Surgical History:  Procedure Laterality Date   COLONOSCOPY WITH PROPOFOL N/A 06/26/2019   Procedure: COLONOSCOPY WITH PROPOFOL;  Surgeon: [name], MD;  Location: [facility] ENDOSCOPY;  Service: Gastroenterology;  Laterality: N/A;  FLEXIBLE SIGMOIDOSCOPY N/A 05/07/2021   Procedure: FLEXIBLE  SIGMOIDOSCOPY;  Surgeon: [name], MD;  Location: [facility] ENDOSCOPY;  Service: Gastroenterology;  Laterality: N/A;   PORTA CATH INSERTION N/A 06/25/2019   Procedure: PORTA CATH INSERTION;  Surgeon: Algernon Huxley, MD;  Location: [place] CV LAB;  Service: Cardiovascular;  Laterality: N/A;   PORTA CATH INSERTION Left 08/11/2020   Procedure: PORTA CATH INSERTION;  Surgeon: Algernon Huxley, MD;  Location: [place] [facility];  Service: Cardiovascular;  Laterality: Left;   PORTA CATH REMOVAL Right 08/11/2020   Procedure: PORTA CATH REMOVAL;  Surgeon: Algernon Huxley, MD;  Location: [place] [facility];  Service: Cardiovascular;  Laterality: Right;    Social History   Socioeconomic History   Marital status: Single    Spouse name: Not on file   Number of children: Not on file   Years of education: Not on file   Highest education level: Not on file  Occupational History   Not on file  Tobacco Use   Smoking status: Every Day    Packs/day: 0.50    Years: 40.00    Pack years: 20.00    Types: Cigarettes   Smokeless tobacco: Never   Tobacco comments:    smoking 40 years  Vaping Use   Vaping Use: Never used  Substance and Sexual Activity   Alcohol use: No   Drug use: No   Sexual activity: Not Currently  Other Topics Concern   Not on file  Social History Narrative   Not on file   Social Determinants of Health   Financial Resource Strain: Not on file  Food Insecurity: Not on file  Transportation Needs: Not on file  Physical Activity: Not on file  Stress: Not on file  Social Connections: Not on file  Intimate Partner Violence: Not on file    Family History  Problem Relation Age of Onset   Brain cancer Father      Current Outpatient Medications:    allopurinol (ZYLOPRIM) 100 MG tablet, Take 100 mg by mouth daily., Disp: , Rfl:    aspirin EC 81 MG tablet, Take 81 mg by mouth daily., Disp: , Rfl:    azelastine (ASTELIN) 0.1 % nasal spray, Place 1 spray into both nostrils daily as  needed., Disp: , Rfl:    butalbital-acetaminophen-caffeine (FIORICET) 50-325-40 MG tablet, Take 1 tablet by mouth every 4 (four) hours as needed., Disp: , Rfl:    capecitabine (XELODA) 500 MG tablet, Take 1 tablet (500 mg total) by mouth 2 (two) times daily after a meal. Take for 14 days, then hold for 7 days. Repeat every 21 days., Disp: 28 tablet, Rfl: 0   cetirizine (ZYRTEC) 10 MG tablet, Take 1 tablet (10 mg total) by mouth daily., Disp: 30 tablet, Rfl: 0   chlorthalidone (HYGROTON) 25 MG tablet, Take 1 tablet by mouth daily., Disp: , Rfl:    DEXILANT 60 MG capsule, Take 60 mg by mouth daily., Disp: , Rfl:    DULoxetine (CYMBALTA) 30 MG capsule, Take 1 capsule (30 mg total) by mouth 2 (two) times daily., Disp: 60 capsule, Rfl: 1   fluticasone (FLONASE) 50 MCG/ACT nasal spray, Place 2 sprays into both nostrils daily as needed., Disp: , Rfl:    fluticasone (VERAMYST) 27.5 MCG/SPRAY nasal spray, Place 2 sprays into the nose daily as needed., Disp: , Rfl:    lisinopril (ZESTRIL) 5 MG tablet, Take 5 mg by mouth daily., Disp: , Rfl:    loperamide (IMODIUM A-D)  2 MG tablet, Take 1 tablet (2 mg total) by mouth 4 (four) times daily as needed. Take 2 at diarrhea onset , then 1 every 2hr until 12hrs with no BM. May take 2 every 4hrs at night. If diarrhea recurs repeat., Disp: 100 tablet, Rfl: 1   montelukast (SINGULAIR) 10 MG tablet, Take 10 mg by mouth at bedtime., Disp: , Rfl:    NEXLETOL 180 MG TABS, Take 1 tablet by mouth daily., Disp: , Rfl:    pregabalin (LYRICA) 75 MG capsule, Take 1 capsule (75 mg total) by mouth 2 (two) times daily., Disp: 60 capsule, Rfl: 2   simvastatin (ZOCOR) 20 MG tablet, Take 20 mg by mouth daily. , Disp: , Rfl:    SYMBICORT 80-4.5 MCG/ACT inhaler, Inhale 2 puffs into the lungs daily., Disp: , Rfl:    tamsulosin (FLOMAX) 0.4 MG CAPS capsule, Take 0.4 mg by mouth. , Disp: , Rfl:    magic mouthwash (multi-ingredient) oral suspension, Swish and spit 5-10 mls by mouth 4 times a  day as needed (Patient not taking: Reported on 01/11/2022), Disp: 480 mL, Rfl: 3   magic mouthwash w/lidocaine SOLN, Take 5-10 mLs by mouth 4 (four) times daily as needed for mouth pain. (Patient not taking: Reported on 01/11/2022), Disp: 480 mL, Rfl: 3   oxyCODONE (OXY IR/ROXICODONE) 5 MG immediate release tablet, Take 1 tablet (5 mg total) by mouth every 6 (six) hours as needed for severe pain. (Patient not taking: Reported on 01/11/2022), Disp: 30 tablet, Rfl: 0   potassium chloride SA (KLOR-CON M) 20 MEQ tablet, Take 1 tablet (20 mEq total) by mouth daily., Disp: 30 tablet, Rfl: 0 No current facility-administered medications for this visit.  Facility-Administered Medications Ordered in Other Visits:    heparin lock flush 100 unit/mL, 500 Units, Intracatheter, Once PRN, Sindy Guadeloupe, MD   irinotecan (CAMPTOSAR) 220 mg in sodium chloride 0.9 % 500 mL chemo infusion, 125 mg/m2 (Treatment Plan Recorded), Intravenous, Once, Sindy Guadeloupe, MD, Last Rate: 341 mL/hr at 01/11/22 1151, 220 mg at 01/11/22 1151  Physical exam:  Vitals:   01/11/22 0837  BP: 119/69  Pulse: 88  Resp: 16  Temp: 97.9 F (36.6 C)  TempSrc: Tympanic  Weight: 151 lb 12.8 oz (68.9 kg)  Height: '5\' 7"'  (1.702 m)   Physical Exam Constitutional:      General: He is not in acute distress. Cardiovascular:     Rate and Rhythm: Normal rate and regular rhythm.     Heart sounds: Normal heart sounds.  Pulmonary:     Effort: Pulmonary effort is normal.     Breath sounds: Normal breath sounds.  Abdominal:     General: Bowel sounds are normal.     Palpations: Abdomen is soft.  Skin:    General: Skin is warm and dry.  Neurological:     Mental Status: He is alert and oriented to person, place, and time.     CMP Latest Ref Rng & Units 01/11/2022  Glucose 70 - 99 mg/dL 168(H)  BUN 8 - 23 mg/dL 31(H)  Creatinine 0.61 - 1.24 mg/dL 1.11  Sodium 135 - 145 mmol/L 135  Potassium 3.5 - 5.1 mmol/L 3.2(L)  Chloride 98 - 111 mmol/L  104  CO2 22 - 32 mmol/L 22  Calcium 8.9 - 10.3 mg/dL 9.2  Total Protein 6.5 - 8.1 g/dL 7.2  Total Bilirubin 0.3 - 1.2 mg/dL 0.6  Alkaline Phos 38 - 126 U/L 97  AST 15 - 41 U/L 31  ALT 0 - 44 U/L 21   CBC Latest Ref Rng & Units 01/11/2022  WBC 4.0 - 10.5 K/uL 5.7  Hemoglobin 13.0 - 17.0 g/dL 13.5  Hematocrit 39.0 - 52.0 % 38.4(L)  Platelets 150 - 400 K/uL 102(L)    Assessment and plan- Patient is a 71 y.o. male  with metastatic K-ras wild-type colon cancer with intra-abdominal lymph node metastases.  He is here for on treatment assessment prior to cycle 9 of panitumumab irinotecan chemotherapy  Counts okay to proceed with cycle 9 of treatment today and I will see him back in 3 weeks for cycle 10.  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  Continue to monitor  Patient will also continue Xeloda 1000 mg twice daily 2 weeks on and 1 week off.  CEA is overall trending down.  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 ([place])   2. Encounter for antineoplastic chemotherapy   3. High risk medication use   4. Chemotherapy-induced peripheral neuropathy ([place])      Dr. Randa Evens, MD, MPH [facility] [phone] 01/11/2022 12:24 PM

## 2022-01-12 LAB — CEA: CEA: 3.7 ng/mL (ref 0.0–4.7)

## 2022-01-26 ENCOUNTER — Other Ambulatory Visit: Payer: Self-pay | Admitting: Oncology

## 2022-01-26 ENCOUNTER — Other Ambulatory Visit (HOSPITAL_COMMUNITY): Payer: Self-pay

## 2022-01-26 DIAGNOSIS — C772 Secondary and unspecified malignant neoplasm of intra-abdominal lymph nodes: Secondary | ICD-10-CM

## 2022-01-26 DIAGNOSIS — C189 Malignant neoplasm of colon, unspecified: Secondary | ICD-10-CM

## 2022-01-26 MED ORDER — CAPECITABINE 500 MG PO TABS
500.0000 mg | ORAL_TABLET | Freq: Two times a day (BID) | ORAL | 0 refills | Status: DC
  Filled 2022-01-26: qty 28, 21d supply, fill #0

## 2022-01-26 NOTE — Telephone Encounter (Signed)
CBC with Differential Order: [id number] Status: Final result    Visible to patient: Yes (seen)    Next appt: 02/01/2022 at 09:00 AM in Oncology ([facility])    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    0 Result Notes           Component Ref Range & Units 2 wk ago (01/11/22) 1 mo ago (12/21/21) 1 mo ago (11/30/21) 2 mo ago (11/09/21) 3 mo ago (10/19/21) 3 mo ago (10/06/21) 4 mo ago (09/14/21)  WBC 4.0 - 10.5 K/uL 5.7  5.7  5.0  5.9  5.9  4.8  5.2   RBC 4.22 - 5.81 MIL/uL 4.02 Low   4.21 Low   3.98 Low   4.04 Low   4.04 Low   3.75 Low   4.07 Low    Hemoglobin 13.0 - 17.0 g/dL 13.5  13.9  13.4  13.6  13.4  12.2 Low   12.9 Low    HCT 39.0 - 52.0 % 38.4 Low   40.2  38.5 Low   39.1  39.1  36.5 Low   39.1   MCV 80.0 - 100.0 fL 95.5  95.5  96.7  96.8  96.8  97.3  96.1   MCH 26.0 - 34.0 pg 33.6  33.0  33.7  33.7  33.2  32.5  31.7   MCHC 30.0 - 36.0 g/dL 35.2  34.6  34.8  34.8  34.3  33.4  33.0   RDW 11.5 - 15.5 % 15.9 High   16.0 High   15.7 High   16.3 High   19.3 High   22.9 High   25.6 High    Platelets 150 - 400 K/uL 102 Low   90 Low  CM  97 Low  CM  88 Low   110 Low   72 Low  CM  85 Low    nRBC 0.0 - 0.2 % 0.0  0.0  0.0  0.0  0.0  0.0  0.0   Neutrophils Relative % % 57  54  51  54  57  55  58   Neutro Abs 1.7 - 7.7 K/uL 3.3  3.1  2.6  3.3  3.4  2.7  3.1   Lymphocytes Relative % 30  32  32  30  29  29  28    Lymphs Abs 0.7 - 4.0 K/uL 1.7  1.8  1.6  1.8  1.7  1.4  1.5   Monocytes Relative % 8  9  11  11  9  12  9    Monocytes Absolute 0.1 - 1.0 K/uL 0.5  0.5  0.6  0.6  0.6  0.6  0.5   Eosinophils Relative % 3  3  4  3  3  2  3    Eosinophils Absolute 0.0 - 0.5 K/uL 0.2  0.2  0.2  0.2  0.2  0.1  0.1   Basophils Relative % 1  1  1  1  1  1  1    Basophils Absolute 0.0 - 0.1 K/uL 0.1  0.0  0.1  0.1  0.1  0.0  0.1   Immature Granulocytes % 1  1  1  1  1  1  1    Abs Immature Granulocytes 0.00 - 0.07 K/uL 0.06  0.03 CM  0.03 CM  0.04 CM  0.05 CM  0.03 CM  0.03 CM   Comment: Performed at  [facility], [address] [place], [place] [address]  Resulting Agency  [facility] [place] [facility] [place] [facility] [place] [facility] [place] [facility] [place] [facility] [place] [facility]         Specimen Collected: 01/11/22 08:17 Last Resulted: 01/11/22 09:08      Lab Flowsheet    Order Details    View Encounter    Lab and Collection Details    Routing    Result History    View Encounter Conversation      CM=Additional comments      Result Care Coordination   Patient Communication   Add Comments   Seen Back to Top       Other Results from 01/11/2022  Magnesium Order: [id number] Status: Final result    Visible to patient: Yes (seen)    Next appt: 02/01/2022 at 09:00 AM in Oncology ([facility])    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    0 Result Notes           Component Ref Range & Units 2 wk ago (01/11/22) 1 mo ago (12/21/21) 1 mo ago (11/30/21) 2 mo ago (11/09/21) 3 mo ago (10/19/21) 3 mo ago (10/06/21) 4 mo ago (09/14/21)  Magnesium 1.7 - 2.4 mg/dL 1.8  1.8 CM  2.1 CM  2.1 CM  2.1 CM  1.7 CM  1.6 Low  CM   Comment: Performed at [facility], [address] [place], [place] [address]  Resulting Agency  [facility] [place] [facility] [place] [facility] [place] [facility] [place] [facility] [place] [facility] [place] [facility]         Specimen Collected: 01/11/22 08:17 Last Resulted: 01/11/22 09:12      Lab Flowsheet    Order Details    View Encounter    Lab and Collection Details    Routing    Result History    View Encounter Conversation      CM=Additional comments      Result Care Coordination   Patient Communication   Add Comments   Seen Back to Top          Contains abnormal data Comprehensive metabolic panel Order: [id number] Status: Final result    Visible to patient: Yes (seen)    Next appt: 02/01/2022 at 09:00 AM in Oncology ([facility])    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    0 Result Notes           Component Ref Range & Units 2 wk ago (01/11/22) 1 mo  ago (12/21/21) 1 mo ago (11/30/21) 2 mo ago (11/09/21) 3 mo ago (10/19/21) 3 mo ago (10/06/21) 4 mo ago (09/14/21)  Sodium 135 - 145 mmol/L 135  134 Low   136  136  135  135  137   Potassium 3.5 - 5.1 mmol/L 3.2 Low   3.3 Low   4.0  4.0  4.1  3.7  3.2 Low    Chloride 98 - 111 mmol/L 104  102  107  105  103  104  105   CO2 22 - 32 mmol/L 22  23  23  27  24  25  23    Glucose, Bld 70 - 99 mg/dL 168 High   144 High  CM  127 High  CM  92 CM  103 High  CM  93 CM  148 High  CM   Comment: Glucose reference range applies only to samples taken after fasting for at least 8 hours.  BUN 8 -  23 mg/dL 31 High   24 High   28 High   27 High   29 High   23  26 High    Creatinine, Ser 0.61 - 1.24 mg/dL 1.11  0.99  0.98  1.05  1.08  0.83  0.89   Calcium 8.9 - 10.3 mg/dL 9.2  9.2  9.0  9.3  9.0  8.7 Low   9.0   Total Protein 6.5 - 8.1 g/dL 7.2  7.4  7.4  7.6  7.8  6.8  7.5   Albumin 3.5 - 5.0 g/dL 3.9  4.0  4.0  4.0  4.0  3.6  3.8   AST 15 - 41 U/L 31  37  38  40  42 High   35  38   ALT 0 - 44 U/L 21  27  25  28  27  22  22    Alkaline Phosphatase 38 - 126 U/L 97  106  101  97  110  84  106   Total Bilirubin 0.3 - 1.2 mg/dL 0.6  0.8  0.8  0.6  0.8  1.0  0.9   GFR, Estimated >60 mL/min >60  >60 CM  >60 CM  >60 CM  >60 CM  >60 CM  >60 CM   Comment: (NOTE)  Calculated using the CKD-EPI Creatinine Equation (2021)   Anion gap 5 - 15 9  9  CM  6 CM  4 Low  CM  8 CM  6 CM  9 CM   Comment: Performed at [facility], [address] [place], [place] [address]  Resulting Agency  [facility] [place] [facility] [place] [facility] [place] [facility] [place] [facility] [place] [facility] [place] [facility]         Specimen Collected: 01/11/22 08:17 Last Resulted: 01/11/22 09:12

## 2022-01-28 ENCOUNTER — Other Ambulatory Visit (HOSPITAL_COMMUNITY): Payer: Self-pay

## 2022-02-01 ENCOUNTER — Inpatient Hospital Stay: Payer: Medicare HMO

## 2022-02-01 ENCOUNTER — Inpatient Hospital Stay (HOSPITAL_BASED_OUTPATIENT_CLINIC_OR_DEPARTMENT_OTHER): Payer: Medicare HMO | Admitting: Oncology

## 2022-02-01 ENCOUNTER — Inpatient Hospital Stay: Payer: Medicare HMO | Attending: Oncology

## 2022-02-01 ENCOUNTER — Encounter: Payer: Self-pay | Admitting: Oncology

## 2022-02-01 ENCOUNTER — Other Ambulatory Visit: Payer: Self-pay

## 2022-02-01 VITALS — BP 120/58 | HR 86 | Temp 98.7°F | Resp 20 | Wt 153.5 lb

## 2022-02-01 DIAGNOSIS — T451X5A Adverse effect of antineoplastic and immunosuppressive drugs, initial encounter: Secondary | ICD-10-CM | POA: Diagnosis not present

## 2022-02-01 DIAGNOSIS — C772 Secondary and unspecified malignant neoplasm of intra-abdominal lymph nodes: Secondary | ICD-10-CM

## 2022-02-01 DIAGNOSIS — Z5111 Encounter for antineoplastic chemotherapy: Secondary | ICD-10-CM | POA: Insufficient documentation

## 2022-02-01 DIAGNOSIS — D6959 Other secondary thrombocytopenia: Secondary | ICD-10-CM | POA: Diagnosis not present

## 2022-02-01 DIAGNOSIS — C189 Malignant neoplasm of colon, unspecified: Secondary | ICD-10-CM

## 2022-02-01 DIAGNOSIS — Z5112 Encounter for antineoplastic immunotherapy: Secondary | ICD-10-CM | POA: Diagnosis not present

## 2022-02-01 DIAGNOSIS — C187 Malignant neoplasm of sigmoid colon: Secondary | ICD-10-CM | POA: Insufficient documentation

## 2022-02-01 DIAGNOSIS — D696 Thrombocytopenia, unspecified: Secondary | ICD-10-CM | POA: Diagnosis not present

## 2022-02-01 DIAGNOSIS — C786 Secondary malignant neoplasm of retroperitoneum and peritoneum: Secondary | ICD-10-CM | POA: Insufficient documentation

## 2022-02-01 LAB — CBC WITH DIFFERENTIAL/PLATELET
Abs Immature Granulocytes: 0.05 10*3/uL (ref 0.00–0.07)
Basophils Absolute: 0.1 10*3/uL (ref 0.0–0.1)
Basophils Relative: 1 %
Eosinophils Absolute: 0.2 10*3/uL (ref 0.0–0.5)
Eosinophils Relative: 4 %
HCT: 40.5 % (ref 39.0–52.0)
Hemoglobin: 13.7 g/dL (ref 13.0–17.0)
Immature Granulocytes: 1 %
Lymphocytes Relative: 33 %
Lymphs Abs: 1.9 10*3/uL (ref 0.7–4.0)
MCH: 33.2 pg (ref 26.0–34.0)
MCHC: 33.8 g/dL (ref 30.0–36.0)
MCV: 98.1 fL (ref 80.0–100.0)
Monocytes Absolute: 0.6 10*3/uL (ref 0.1–1.0)
Monocytes Relative: 10 %
Neutro Abs: 2.9 10*3/uL (ref 1.7–7.7)
Neutrophils Relative %: 51 %
Platelets: 123 10*3/uL — ABNORMAL LOW (ref 150–400)
RBC: 4.13 MIL/uL — ABNORMAL LOW (ref 4.22–5.81)
RDW: 15.6 % — ABNORMAL HIGH (ref 11.5–15.5)
WBC: 5.7 10*3/uL (ref 4.0–10.5)
nRBC: 0 % (ref 0.0–0.2)

## 2022-02-01 LAB — MAGNESIUM: Magnesium: 2.1 mg/dL (ref 1.7–2.4)

## 2022-02-01 LAB — COMPREHENSIVE METABOLIC PANEL
ALT: 20 U/L (ref 0–44)
AST: 31 U/L (ref 15–41)
Albumin: 3.9 g/dL (ref 3.5–5.0)
Alkaline Phosphatase: 104 U/L (ref 38–126)
Anion gap: 8 (ref 5–15)
BUN: 40 mg/dL — ABNORMAL HIGH (ref 8–23)
CO2: 20 mmol/L — ABNORMAL LOW (ref 22–32)
Calcium: 8.8 mg/dL — ABNORMAL LOW (ref 8.9–10.3)
Chloride: 106 mmol/L (ref 98–111)
Creatinine, Ser: 1.2 mg/dL (ref 0.61–1.24)
GFR, Estimated: >60 mL/min (ref >60)
Glucose, Bld: 122 mg/dL — ABNORMAL HIGH (ref 70–99)
Potassium: 3.8 mmol/L (ref 3.5–5.1)
Sodium: 134 mmol/L — ABNORMAL LOW (ref 135–145)
Total Bilirubin: 0.4 mg/dL (ref 0.3–1.2)
Total Protein: 7.4 g/dL (ref 6.5–8.1)

## 2022-02-01 MED ORDER — ATROPINE SULFATE 1 MG/ML IV SOLN
0.5000 mg | Freq: Once | INTRAVENOUS | Status: AC | PRN
  Administered 2022-02-01: 0.5 mg via INTRAVENOUS
  Filled 2022-02-01: qty 1

## 2022-02-01 MED ORDER — PALONOSETRON HCL INJECTION 0.25 MG/5ML
0.2500 mg | Freq: Once | INTRAVENOUS | Status: AC
  Administered 2022-02-01: 0.25 mg via INTRAVENOUS
  Filled 2022-02-01: qty 5

## 2022-02-01 MED ORDER — SODIUM CHLORIDE 0.9 % IV SOLN
Freq: Once | INTRAVENOUS | Status: DC
  Filled 2022-02-01: qty 250

## 2022-02-01 MED ORDER — SODIUM CHLORIDE 0.9% FLUSH
10.0000 mL | Freq: Once | INTRAVENOUS | Status: DC
  Filled 2022-02-01: qty 10

## 2022-02-01 MED ORDER — HEPARIN SOD (PORK) LOCK FLUSH 100 UNIT/ML IV SOLN
500.0000 [IU] | Freq: Once | INTRAVENOUS | Status: AC | PRN
  Administered 2022-02-01: 500 [IU]
  Filled 2022-02-01: qty 5

## 2022-02-01 MED ORDER — SODIUM CHLORIDE 0.9 % IV SOLN
6.0000 mg/kg | Freq: Once | INTRAVENOUS | Status: AC
  Administered 2022-02-01: 400 mg via INTRAVENOUS
  Filled 2022-02-01: qty 20

## 2022-02-01 MED ORDER — SODIUM CHLORIDE 0.9 % IV SOLN
Freq: Once | INTRAVENOUS | Status: AC
  Filled 2022-02-01: qty 250

## 2022-02-01 MED ORDER — SODIUM CHLORIDE 0.9 % IV SOLN
10.0000 mg | Freq: Once | INTRAVENOUS | Status: AC
  Administered 2022-02-01: 10 mg via INTRAVENOUS
  Filled 2022-02-01: qty 10

## 2022-02-01 MED ORDER — SODIUM CHLORIDE 0.9 % IV SOLN
125.0000 mg/m2 | Freq: Once | INTRAVENOUS | Status: AC
  Administered 2022-02-01: 220 mg via INTRAVENOUS
  Filled 2022-02-01: qty 11
  Filled 2022-02-01: qty 10

## 2022-02-01 NOTE — Patient Instructions (Signed)
[facility] AT [place]  Discharge Instructions: Thank you for choosing [facility] to provide your oncology and hematology care.  If you have a lab appointment with the [facility], please go directly to the [facility] and check in at the registration area.  Wear comfortable clothing and clothing appropriate for easy access to any Portacath or PICC line.   We strive to give you quality time with your provider. You may need to reschedule your appointment if you arrive late (15 or more minutes).  Arriving late affects you and other patients whose appointments are after yours.  Also, if you miss three or more appointments without notifying the office, you may be dismissed from the clinic at the providers discretion.      For prescription refill requests, have your pharmacy contact our office and allow 72 hours for refills to be completed.    Today you received the following chemotherapy and/or immunotherapy agents VECTIBEX and IRINOTECAN      To help prevent nausea and vomiting after your treatment, we encourage you to take your nausea medication as directed.  BELOW ARE SYMPTOMS THAT SHOULD BE REPORTED IMMEDIATELY: *FEVER GREATER THAN 100.4 F (38 C) OR HIGHER *CHILLS OR SWEATING *NAUSEA AND VOMITING THAT IS NOT CONTROLLED WITH YOUR NAUSEA MEDICATION *UNUSUAL SHORTNESS OF BREATH *UNUSUAL BRUISING OR BLEEDING *URINARY PROBLEMS (pain or burning when urinating, or frequent urination) *BOWEL PROBLEMS (unusual diarrhea, constipation, pain near the anus) TENDERNESS IN MOUTH AND THROAT WITH OR WITHOUT PRESENCE OF ULCERS (sore throat, sores in mouth, or a toothache) UNUSUAL RASH, SWELLING OR PAIN  UNUSUAL VAGINAL DISCHARGE OR ITCHING   Items with * indicate a potential emergency and should be followed up as soon as possible or go to the Emergency Department if any problems should occur.  Please show the CHEMOTHERAPY ALERT CARD or IMMUNOTHERAPY ALERT CARD at  check-in to the Emergency Department and triage nurse.  Should you have questions after your visit or need to cancel or reschedule your appointment, please contact [facility] [place] [facility] [place]  [phone] and follow the prompts.  Office hours are 8:00 a.m. to 4:30 p.m. Monday - Friday. Please note that voicemails left after 4:00 p.m. may not be returned until the following business day.  We are closed weekends and major holidays. You have access to a nurse at all times for urgent questions. Please call the main number to the clinic [phone] and follow the prompts.  For any non-urgent questions, you may also contact your provider using MyChart. We now offer e-Visits for anyone 55 and older to request care online for non-urgent symptoms. For details visit [email or web address].   Also download the MyChart app! Go to the app store, search "MyChart", open the app, select [facility], and log in with your MyChart username and password.  Due to Covid, a mask is required upon entering the hospital/clinic. If you do not have a mask, one will be given to you upon arrival. For doctor visits, patients may have 1 support person aged 34 or older with them. For treatment visits, patients cannot have anyone with them due to current Covid guidelines and our immunocompromised population.   Panitumumab Solution for Injection What is this medication? PANITUMUMAB (pan i TOOM ue mab) is a monoclonal antibody. It is used to treat colorectal cancer. This medicine may be used for other purposes; ask your health care provider or pharmacist if you have questions. COMMON BRAND NAME(S): Vectibix What should I tell my  care team before I take this medication? They need to know if you have any of these conditions: eye disease, vision problems low levels of calcium, magnesium, or potassium in the blood lung or breathing disease, like asthma skin conditions or sensitivity an unusual or allergic reaction  to panitumumab, other medicines, foods, dyes, or preservatives pregnant or trying to get pregnant breast-feeding How should I use this medication? This drug is given as an infusion into a vein. It is administered in a hospital or clinic by a specially trained health care professional. Talk to your pediatrician regarding the use of this medicine in children. Special care may be needed. Overdosage: If you think you have taken too much of this medicine contact a poison control center or emergency room at once. NOTE: This medicine is only for you. Do not share this medicine with others. What if I miss a dose? It is important not to miss your dose. Call your doctor or health care professional if you are unable to keep an appointment. What may interact with this medication? Do not take this medicine with any of the following medications: bevacizumab This list may not describe all possible interactions. Give your health care provider a list of all the medicines, herbs, non-prescription drugs, or dietary supplements you use. Also tell them if you smoke, drink alcohol, or use illegal drugs. Some items may interact with your medicine. What should I watch for while using this medication? Visit your doctor for checks on your progress. This drug may make you feel generally unwell. This is not uncommon, as chemotherapy can affect healthy cells as well as cancer cells. Report any side effects. Continue your course of treatment even though you feel ill unless your doctor tells you to stop. This medicine can make you more sensitive to the sun. Keep out of the sun while receiving this medicine and for 2 months after the last dose. If you cannot avoid being in the sun, wear protective clothing and use sunscreen. Do not use sun lamps or tanning beds/booths. In some cases, you may be given additional medicines to help with side effects. Follow all directions for their use. Call your doctor or health care professional  for advice if you get a fever, chills or sore throat, or other symptoms of a cold or flu. Do not treat yourself. This drug decreases your body's ability to fight infections. Try to avoid being around people who are sick. Avoid taking products that contain aspirin, acetaminophen, ibuprofen, naproxen, or ketoprofen unless instructed by your doctor. These medicines may hide a fever. Do not become pregnant while taking this medicine and for 2 months after the last dose. Women should inform their doctor if they wish to become pregnant or think they might be pregnant. There is a potential for serious side effects to an unborn child. Talk to your health care professional or pharmacist for more information. Do not breast-feed an infant while taking this medicine or for 2 months after the last dose. What side effects may I notice from receiving this medication? Side effects that you should report to your doctor or health care professional as soon as possible: allergic reactions like skin rash, itching or hives, swelling of the face, lips, or tongue breathing problems changes in vision eye pain fast, irregular heartbeat fever, chills mouth sores red spots on the skin redness, blistering, peeling or loosening of the skin, including inside the mouth signs and symptoms of kidney injury like trouble passing urine or change in  the amount of urine signs and symptoms of low blood pressure like dizziness; feeling faint or lightheaded, falls; unusually weak or tired signs of low calcium like fast heartbeat, muscle cramps or muscle pain; pain, tingling, numbness in the hands or feet; seizures signs and symptoms of low magnesium like muscle cramps, pain, or weakness; tremors; seizures; or fast, irregular heartbeat signs and symptoms of low potassium like muscle cramps or muscle pain; chest pain; dizziness; feeling faint or lightheaded, falls; palpitations; breathing problems; or fast, irregular heartbeat swelling of  the ankles, feet, hands Side effects that usually do not require medical attention (report to your doctor or health care professional if they continue or are bothersome): changes in skin like acne, cracks, skin dryness diarrhea eyelash growth headache mouth sores nail changes nausea, vomiting This list may not describe all possible side effects. Call your doctor for medical advice about side effects. You may report side effects to FDA at 1-800-FDA-1088. Where should I keep my medication? This drug is given in a hospital or clinic and will not be stored at home. NOTE: This sheet is a summary. It may not cover all possible information. If you have questions about this medicine, talk to your doctor, pharmacist, or health care provider.  2022 Elsevier/Gold Standard (2016-06-25 00:00:00)  Irinotecan injection What is this medication? IRINOTECAN (ir in oh TEE kan ) is a chemotherapy drug. It is used to treat colon and rectal cancer. This medicine may be used for other purposes; ask your health care provider or pharmacist if you have questions. COMMON BRAND NAME(S): Camptosar What should I tell my care team before I take this medication? They need to know if you have any of these conditions: dehydration diarrhea infection (especially a virus infection such as chickenpox, cold sores, or herpes) liver disease low blood counts, like low white cell, platelet, or red cell counts low levels of calcium, magnesium, or potassium in the blood recent or ongoing radiation therapy an unusual or allergic reaction to irinotecan, other medicines, foods, dyes, or preservatives pregnant or trying to get pregnant breast-feeding How should I use this medication? This drug is given as an infusion into a vein. It is administered in a hospital or clinic by a specially trained health care professional. Talk to your pediatrician regarding the use of this medicine in children. Special care may be  needed. Overdosage: If you think you have taken too much of this medicine contact a poison control center or emergency room at once. NOTE: This medicine is only for you. Do not share this medicine with others. What if I miss a dose? It is important not to miss your dose. Call your doctor or health care professional if you are unable to keep an appointment. What may interact with this medication? Do not take this medicine with any of the following medications: cobicistat itraconazole This medicine may interact with the following medications: antiviral medicines for HIV or AIDS certain antibiotics like rifampin or rifabutin certain medicines for fungal infections like ketoconazole, posaconazole, and voriconazole certain medicines for seizures like carbamazepine, phenobarbital, phenotoin clarithromycin gemfibrozil nefazodone St. John's Wort This list may not describe all possible interactions. Give your health care provider a list of all the medicines, herbs, non-prescription drugs, or dietary supplements you use. Also tell them if you smoke, drink alcohol, or use illegal drugs. Some items may interact with your medicine. What should I watch for while using this medication? Your condition will be monitored carefully while you are receiving this medicine.  You will need important blood work done while you are taking this medicine. This drug may make you feel generally unwell. This is not uncommon, as chemotherapy can affect healthy cells as well as cancer cells. Report any side effects. Continue your course of treatment even though you feel ill unless your doctor tells you to stop. In some cases, you may be given additional medicines to help with side effects. Follow all directions for their use. You may get drowsy or dizzy. Do not drive, use machinery, or do anything that needs mental alertness until you know how this medicine affects you. Do not stand or sit up quickly, especially if you are an  older patient. This reduces the risk of dizzy or fainting spells. Call your health care professional for advice if you get a fever, chills, or sore throat, or other symptoms of a cold or flu. Do not treat yourself. This medicine decreases your body's ability to fight infections. Try to avoid being around people who are sick. Avoid taking products that contain aspirin, acetaminophen, ibuprofen, naproxen, or ketoprofen unless instructed by your doctor. These medicines may hide a fever. This medicine may increase your risk to bruise or bleed. Call your doctor or health care professional if you notice any unusual bleeding. Be careful brushing and flossing your teeth or using a toothpick because you may get an infection or bleed more easily. If you have any dental work done, tell your dentist you are receiving this medicine. Do not become pregnant while taking this medicine or for 6 months after stopping it. Women should inform their health care professional if they wish to become pregnant or think they might be pregnant. Men should not father a child while taking this medicine and for 3 months after stopping it. There is potential for serious side effects to an unborn child. Talk to your health care professional for more information. Do not breast-feed an infant while taking this medicine or for 7 days after stopping it. This medicine has caused ovarian failure in some women. This medicine may make it more difficult to get pregnant. Talk to your health care professional if you are concerned about your fertility. This medicine has caused decreased sperm counts in some men. This may make it more difficult to father a child. Talk to your health care professional if you are concerned about your fertility. What side effects may I notice from receiving this medication? Side effects that you should report to your doctor or health care professional as soon as possible: allergic reactions like skin rash, itching or  hives, swelling of the face, lips, or tongue chest pain diarrhea flushing, runny nose, sweating during infusion low blood counts - this medicine may decrease the number of white blood cells, red blood cells and platelets. You may be at increased risk for infections and bleeding. nausea, vomiting pain, swelling, warmth in the leg signs of decreased platelets or bleeding - bruising, pinpoint red spots on the skin, black, tarry stools, blood in the urine signs of infection - fever or chills, cough, sore throat, pain or difficulty passing urine signs of decreased red blood cells - unusually weak or tired, fainting spells, lightheadedness Side effects that usually do not require medical attention (report to your doctor or health care professional if they continue or are bothersome): constipation hair loss headache loss of appetite mouth sores stomach pain This list may not describe all possible side effects. Call your doctor for medical advice about side effects. You may report  side effects to FDA at 1-800-FDA-1088. Where should I keep my medication? This drug is given in a hospital or clinic and will not be stored at home. NOTE: This sheet is a summary. It may not cover all possible information. If you have questions about this medicine, talk to your doctor, pharmacist, or health care provider.  2022 Elsevier/Gold Standard (2021-08-18 00:00:00)

## 2022-02-01 NOTE — Progress Notes (Signed)
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 He currently smokes 0.5ppd. he presented to the ER with symptoms of left sided chest pain and left arm pain. Troponin was negative ekg was unremarkable. Ct chest showed no PE. He was incidentally noted to have mediastinal and retrocrural adenopathy and a 2.1X2.3X4.4 cm retroaortic soft tissue lesion in the posterior left chest. 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  Low-grade activity in the left lateral fifth and sixth ribs associated with nondisplaced fractures likely reflecting healing response problem malignancy.   Patient underwent CT-guided biopsy of the retroperitoneal lymph node pathology showed metastatic adenocarcinoma compatible with colorectal origin.  CK7 negative.  CK20 positive.  CDX 2+.  TTF-1 negative.  PSA negative.  This pattern of immunoreactivity supports the above diagnosis. Patient underwent colonoscopy which showed sigmoid mass that was consistent with adenocarcinoma.  RAS panel testing showed that he was wild-type for both K-ras and BRAF   FOLFOX and bevacizumab chemotherapy started in July 2020.  Subsequently oxaliplatin has been on hold given neuropathy.   Recent scans have shown slow increase in the size of intra-abdominal adenopathy but patient continues to have low-volume disease.plan is to switch him to second line Xeloda irinotecan panitumumab  regimen      Interval history-tolerating chemotherapy well without any significant side effects.  Denies any nausea, vomiting or diarrhea.  Denies any GI bleed.  Appetite and weight have remained stable  ECOG PS- 1 Pain scale- 0   Review of systems- Review of Systems  Constitutional:  Negative for chills, fever, malaise/fatigue and weight loss.  HENT:  Negative for congestion, ear discharge and nosebleeds.   Eyes:  Negative for blurred vision.  Respiratory:  Negative for cough, hemoptysis, sputum production, shortness of breath and wheezing.   Cardiovascular:  Negative for chest pain, palpitations, orthopnea and claudication.  Gastrointestinal:  Negative for abdominal pain, blood in stool, constipation, diarrhea, heartburn, melena, nausea and vomiting.  Genitourinary:  Negative for dysuria, flank pain, frequency, hematuria and urgency.  Musculoskeletal:  Negative for back pain, joint pain and myalgias.  Skin:  Negative for rash.  Neurological:  Negative for dizziness, tingling, focal weakness, seizures, weakness and headaches.  Endo/Heme/Allergies:  Does not bruise/bleed easily.  Psychiatric/Behavioral:  Negative for depression and suicidal ideas. The patient does not have insomnia.    No Known Allergies   Past Medical History:  Diagnosis Date   Colon cancer ([place])    COPD (chronic obstructive pulmonary disease) ([place])    Hyperlipidemia      Past Surgical History:  Procedure Laterality Date   COLONOSCOPY WITH PROPOFOL N/A 06/26/2019   Procedure: COLONOSCOPY WITH PROPOFOL;  Surgeon: [name], MD;  Location: [facility] ENDOSCOPY;  Service: Gastroenterology;  Laterality: N/A;   FLEXIBLE SIGMOIDOSCOPY N/A 05/07/2021   Procedure: FLEXIBLE SIGMOIDOSCOPY;  Surgeon: [name], MD;  Location: [facility]  ENDOSCOPY;  Service: Gastroenterology;  Laterality: N/A;   PORTA CATH INSERTION N/A 06/25/2019   Procedure: PORTA CATH INSERTION;  Surgeon: Algernon Huxley, MD;  Location: [place] CV LAB;  Service: Cardiovascular;  Laterality: N/A;   PORTA CATH INSERTION Left 08/11/2020   Procedure: PORTA CATH INSERTION;  Surgeon: Algernon Huxley, MD;  Location: [place] [facility] LAB;  Service: Cardiovascular;  Laterality: Left;   PORTA CATH REMOVAL Right 08/11/2020   Procedure: PORTA CATH REMOVAL;  Surgeon: Algernon Huxley, MD;  Location: [place] CV LAB;  Service: Cardiovascular;  Laterality: Right;    Social History   Socioeconomic History   Marital status: Single    Spouse name: Not on file   Number of children: Not on file   Years of education: Not on file   Highest education level: Not on file  Occupational History   Not on file  Tobacco Use   Smoking status: Every Day    Packs/day: 0.50    Years: 40.00    Pack years: 20.00    Types: Cigarettes   Smokeless tobacco: Never   Tobacco comments:    smoking 40 years  Vaping Use   Vaping Use: Never used  Substance and Sexual Activity   Alcohol use: No   Drug use: No   Sexual activity: Not Currently  Other Topics Concern   Not on file  Social History Narrative   Not on file   Social Determinants of Health   Financial Resource Strain: Not on file  Food Insecurity: Not on file  Transportation Needs: Not on file  Physical Activity: Not on file  Stress: Not on file  Social Connections: Not on file  Intimate Partner Violence: Not on file    Family History  Problem Relation Age of Onset   Brain cancer Father      Current Outpatient Medications:    allopurinol (ZYLOPRIM) 100 MG tablet, Take 100 mg by mouth daily., Disp: , Rfl:    aspirin EC 81 MG tablet, Take 81 mg by mouth daily., Disp: , Rfl:    azelastine (ASTELIN) 0.1 % nasal spray, Place 1 spray into both nostrils daily as needed., Disp: , Rfl:     butalbital-acetaminophen-caffeine (FIORICET) 50-325-40 MG tablet, Take 1 tablet by mouth every 4 (four) hours as needed., Disp: , Rfl:    capecitabine (XELODA) 500 MG tablet, Take 1 tablet (500 mg total) by mouth 2 (two) times daily after a meal. Take for 14 days, then hold for 7 days. Repeat every 21 days., Disp: 28 tablet, Rfl: 0   cetirizine (ZYRTEC) 10 MG tablet, Take 1 tablet (10 mg total) by mouth daily., Disp: 30 tablet, Rfl: 0   chlorthalidone (HYGROTON) 25 MG tablet, Take 1 tablet by mouth daily., Disp: , Rfl:    DEXILANT 60 MG capsule, Take 60 mg by mouth daily., Disp: , Rfl:    DULoxetine (CYMBALTA) 30 MG capsule, Take 1 capsule (30 mg total) by mouth 2 (two) times daily., Disp: 60 capsule, Rfl: 1   fluticasone (FLONASE) 50 MCG/ACT nasal spray, Place 2 sprays into both nostrils daily as needed., Disp: , Rfl:    fluticasone (VERAMYST) 27.5 MCG/SPRAY nasal spray, Place 2 sprays into the nose daily as needed., Disp: , Rfl:    lisinopril (ZESTRIL) 5 MG tablet, Take 5 mg by mouth daily., Disp: , Rfl:    loperamide (IMODIUM A-D) 2 MG tablet, Take 1 tablet (2 mg total) by  mouth 4 (four) times daily as needed. Take 2 at diarrhea onset , then 1 every 2hr until 12hrs with no BM. May take 2 every 4hrs at night. If diarrhea recurs repeat., Disp: 100 tablet, Rfl: 1   montelukast (SINGULAIR) 10 MG tablet, Take 10 mg by mouth at bedtime., Disp: , Rfl:    NEXLETOL 180 MG TABS, Take 1 tablet by mouth daily., Disp: , Rfl:    potassium chloride SA (KLOR-CON M) 20 MEQ tablet, Take 1 tablet (20 mEq total) by mouth daily., Disp: 30 tablet, Rfl: 0   pregabalin (LYRICA) 75 MG capsule, Take 1 capsule (75 mg total) by mouth 2 (two) times daily., Disp: 60 capsule, Rfl: 2   simvastatin (ZOCOR) 20 MG tablet, Take 20 mg by mouth daily. , Disp: , Rfl:    SYMBICORT 80-4.5 MCG/ACT inhaler, Inhale 2 puffs into the lungs daily., Disp: , Rfl:    tamsulosin (FLOMAX) 0.4 MG CAPS capsule, Take 0.4 mg by mouth. , Disp: , Rfl:     magic mouthwash (multi-ingredient) oral suspension, Swish and spit 5-10 mls by mouth 4 times a day as needed (Patient not taking: Reported on 01/11/2022), Disp: 480 mL, Rfl: 3   magic mouthwash w/lidocaine SOLN, Take 5-10 mLs by mouth 4 (four) times daily as needed for mouth pain. (Patient not taking: Reported on 01/11/2022), Disp: 480 mL, Rfl: 3   oxyCODONE (OXY IR/ROXICODONE) 5 MG immediate release tablet, Take 1 tablet (5 mg total) by mouth every 6 (six) hours as needed for severe pain. (Patient not taking: Reported on 01/11/2022), Disp: 30 tablet, Rfl: 0 No current facility-administered medications for this visit.  Facility-Administered Medications Ordered in Other Visits:    0.9 %  sodium chloride infusion, , Intravenous, Once, Sindy Guadeloupe, MD   heparin lock flush 100 unit/mL, 500 Units, Intracatheter, Once PRN, Sindy Guadeloupe, MD   irinotecan (CAMPTOSAR) 220 mg in sodium chloride 0.9 % 500 mL chemo infusion, 125 mg/m2 (Treatment Plan Recorded), Intravenous, Once, Sindy Guadeloupe, MD, Last Rate: 341 mL/hr at 02/01/22 1202, 220 mg at 02/01/22 1202   sodium chloride flush (NS) 0.9 % injection 10 mL, 10 mL, Intravenous, Once, Sindy Guadeloupe, MD  Physical exam:  Vitals:   02/01/22 0909  BP: (!) 120/58  Pulse: 86  Resp: 20  Temp: 98.7 F (37.1 C)  SpO2: 100%  Weight: 153 lb 8 oz (69.6 kg)   Physical Exam Cardiovascular:     Rate and Rhythm: Normal rate and regular rhythm.     Heart sounds: Normal heart sounds.  Pulmonary:     Effort: Pulmonary effort is normal.     Breath sounds: Normal breath sounds.  Skin:    General: Skin is warm and dry.  Neurological:     Mental Status: He is alert and oriented to person, place, and time.     CMP Latest Ref Rng & Units 02/01/2022  Glucose 70 - 99 mg/dL 122(H)  BUN 8 - 23 mg/dL 40(H)  Creatinine 0.61 - 1.24 mg/dL 1.20  Sodium 135 - 145 mmol/L 134(L)  Potassium 3.5 - 5.1 mmol/L 3.8  Chloride 98 - 111 mmol/L 106  CO2 22 - 32 mmol/L 20(L)   Calcium 8.9 - 10.3 mg/dL 8.8(L)  Total Protein 6.5 - 8.1 g/dL 7.4  Total Bilirubin 0.3 - 1.2 mg/dL 0.4  Alkaline Phos 38 - 126 U/L 104  AST 15 - 41 U/L 31  ALT 0 - 44 U/L 20   CBC Latest Ref Rng &  Units 02/01/2022  WBC 4.0 - 10.5 K/uL 5.7  Hemoglobin 13.0 - 17.0 g/dL 13.7  Hematocrit 39.0 - 52.0 % 40.5  Platelets 150 - 400 K/uL 123(L)   Assessment and plan- Patient is a 71 y.o. male  with metastatic K-ras wild-type colon cancer with intra-abdominal lymph node metastases.  He is here for on treatment assessment prior to cycle 10 of panitumumab irinotecan chemotherapy  Counseled to proceed with cycle 10 of panitumumab irinotecan chemotherapy today.  He is also taking Xeloda 1000 mg twice daily 2 weeks on and 1 week off which she is tolerating it well.  He would like to skip treatment in 3 weeks and I will see him back in 6 weeks with scans prior.  He will however continue to take Xeloda 2 weeks on and 1 week off.  Mild thrombocytopenia likely secondary to chemotherapy.  Continue to monitor   Visit Diagnosis 1. 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 ([place])   2. Encounter for antineoplastic chemotherapy   3. Encounter for monoclonal antibody treatment for malignancy   4. Chemotherapy-induced thrombocytopenia      Dr. Randa Evens, MD, MPH [facility] [phone] 02/01/2022 12:36 PM

## 2022-02-01 NOTE — Progress Notes (Signed)
Patient states no concerns at the moment. 

## 2022-02-02 LAB — CEA: CEA: 4.2 ng/mL (ref 0.0–4.7)

## 2022-02-03 ENCOUNTER — Inpatient Hospital Stay: Payer: Medicare HMO

## 2022-02-15 ENCOUNTER — Other Ambulatory Visit (HOSPITAL_COMMUNITY): Payer: Self-pay

## 2022-02-15 ENCOUNTER — Other Ambulatory Visit: Payer: Self-pay | Admitting: Oncology

## 2022-02-15 DIAGNOSIS — C189 Malignant neoplasm of colon, unspecified: Secondary | ICD-10-CM

## 2022-02-15 DIAGNOSIS — C772 Secondary and unspecified malignant neoplasm of intra-abdominal lymph nodes: Secondary | ICD-10-CM

## 2022-02-15 MED ORDER — CAPECITABINE 500 MG PO TABS
500.0000 mg | ORAL_TABLET | Freq: Two times a day (BID) | ORAL | 0 refills | Status: DC
  Filled 2022-02-15: qty 28, 21d supply, fill #0

## 2022-02-15 NOTE — Telephone Encounter (Signed)
CBC with Differential ?Order: [id number] ?Status: Final result    ?Visible to patient: Yes (seen)    ?Next appt: 03/08/2022 at 08:00 AM in Radiology ([facility])    ?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    ?0 Result Notes ?          ?Component Ref Range & Units 2 wk ago ?(02/01/22) 1 mo ago ?(01/11/22) 1 mo ago ?(12/21/21) 2 mo ago ?(11/30/21) 3 mo ago ?(11/09/21) 3 mo ago ?(10/19/21) 4 mo ago ?(10/06/21)  ?WBC 4.0 - 10.5 K/uL 5.7  5.7  5.7  5.0  5.9  5.9  4.8   ?RBC 4.22 - 5.81 MIL/uL 4.13 Low   4.02 Low   4.21 Low   3.98 Low   4.04 Low   4.04 Low   3.75 Low    ?Hemoglobin 13.0 - 17.0 g/dL 13.7  13.5  13.9  13.4  13.6  13.4  12.2 Low    ?HCT 39.0 - 52.0 % 40.5  38.4 Low   40.2  38.5 Low   39.1  39.1  36.5 Low    ?MCV 80.0 - 100.0 fL 98.1  95.5  95.5  96.7  96.8  96.8  97.3   ?MCH 26.0 - 34.0 pg 33.2  33.6  33.0  33.7  33.7  33.2  32.5   ?MCHC 30.0 - 36.0 g/dL 33.8  35.2  34.6  34.8  34.8  34.3  33.4   ?RDW 11.5 - 15.5 % 15.6 High   15.9 High   16.0 High   15.7 High   16.3 High   19.3 High   22.9 High    ?Platelets 150 - 400 K/uL 123 Low   102 Low   90 Low  CM  97 Low  CM  88 Low   110 Low   72 Low  CM   ?Comment: SPECIMEN CHECKED FOR CLOTS  ?nRBC 0.0 - 0.2 % 0.0  0.0  0.0  0.0  0.0  0.0  0.0   ?Neutrophils Relative % % 51  57  54  51  54  57  55   ?Neutro Abs 1.7 - 7.7 K/uL 2.9  3.3  3.1  2.6  3.3  3.4  2.7   ?Lymphocytes Relative % 33  30  32  32  '30  29  29   '$ ?Lymphs Abs 0.7 - 4.0 K/uL 1.9  1.7  1.8  1.6  1.8  1.7  1.4   ?Monocytes Relative % '10  8  9  11  11  9  12   '$ ?Monocytes Absolute 0.1 - 1.0 K/uL 0.6  0.5  0.5  0.6  0.6  0.6  0.6   ?Eosinophils Relative % '4  3  3  4  3  3  2   '$ ?Eosinophils Absolute 0.0 - 0.5 K/uL 0.2  0.2  0.2  0.2  0.2  0.2  0.1   ?Basophils Relative % '1  1  1  1  1  1  1   '$ ?Basophils Absolute 0.0 - 0.1 K/uL 0.1  0.1  0.0  0.1  0.1  0.1  0.0   ?Immature Granulocytes % '1  1  1  1  1  1  1   '$ ?Abs Immature Granulocytes 0.00 - 0.07 K/uL 0.05  0.06 CM  0.03 CM  0.03 CM  0.04 CM  0.05 CM  0.03  CM   ?Comment: Performed at [facility], [address] [place],  [place] [address]  ?Resulting Agency  [facility] [place] [facility] [place] [facility] [place] [facility] [place] [facility] [place] [facility] [place] [facility]  ?  ? ?  ?  ?Specimen Collected: 02/01/22 08:49 Last Resulted: 02/01/22 09:35  ?  ?  Lab Flowsheet   ? Order Details   ? View Encounter   ? Lab and Collection Details   ? Routing   ? Result History    ?View Encounter Conversation    ?  ?CM=Additional comments    ?  ?Result Care Coordination ? ? ?Patient Communication ? ? Add Comments   Seen Back to Top  ?  ?  ? ?Other Results from 02/01/2022 ? ?Magnesium ?Order: [id number] ?Status: Final result    ?Visible to patient: Yes (seen)    ?Next appt: 03/08/2022 at 08:00 AM in Radiology ([facility])    ?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    ?0 Result Notes ?          ?Component Ref Range & Units 2 wk ago ?(02/01/22) 1 mo ago ?(01/11/22) 1 mo ago ?(12/21/21) 2 mo ago ?(11/30/21) 3 mo ago ?(11/09/21) 3 mo ago ?(10/19/21) 4 mo ago ?(10/06/21)  ?Magnesium 1.7 - 2.4 mg/dL 2.1  1.8 CM  1.8 CM  2.1 CM  2.1 CM  2.1 CM  1.7 CM   ?Comment: Performed at [facility], [address] [place], [place] [address]  ?Resulting Agency  [place] [place] [facility] [place] [facility] [place] [facility] [place] [facility] [place] [facility] [place] [facility]  ?  ? ?  ?  ?Specimen Collected: 02/01/22 08:49 Last Resulted: 02/01/22 09:11  ?  ?  Lab Flowsheet   ? Order Details   ? View Encounter   ? Lab and Collection Details   ? Routing   ? Result History    ?View Encounter Conversation    ?  ?CM=Additional comments    ?  ?Result Care Coordination ? ? ?Patient Communication ? ? Add Comments   Seen Back to Top  ?  ?  ? ?  ?CEA ?Order: [id number] ?Status: Final result    ?Visible to patient: Yes (seen)    ?Next appt: 03/08/2022 at 08:00 AM in Radiology ([facility])    ?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    ?0 Result Notes ?          ?Component Ref Range & Units 2 wk ago ?(02/01/22) 1 mo ago ?(01/11/22) 3 mo ago ?(11/09/21) 3 mo  ago ?(10/19/21) 4 mo ago ?(10/06/21) 5 mo ago ?(08/24/21) 7 mo ago ?(06/29/21)  ?CEA 0.0 - 4.7 ng/mL 4.2  3.7 CM  4.3 CM  6.1 High  CM  3.9 CM  4.6 CM  7.8 High  CM   ?Comment: (NOTE)  ?                            Nonsmokers          <3.9  ?                            Smokers             <5.6  ?Roche Diagnostics Electrochemiluminescence Immunoassay  ?(ECLIA)  ?Values obtained with different assay methods or kits  ?cannot be used interchangeably.  Results cannot be  ?interpreted as absolute evidence of the presence or  ?absence  of malignant disease.  ?Performed At: [facility]  ?[address] [place], [place] [id number]  ?Rush Farmer MD TV:[phone]   ?Resulting Agency  [facility] [place] [facility] [place] [facility] [place] [facility] [place] [facility] [place] [facility] [place] [facility]  ?  ? ?  ?  ?Specimen Collected: 02/01/22 08:49 Last Resulted: 02/02/22   ?  ?  ? ?

## 2022-02-17 ENCOUNTER — Other Ambulatory Visit (HOSPITAL_COMMUNITY): Payer: Self-pay

## 2022-03-04 ENCOUNTER — Other Ambulatory Visit: Payer: Self-pay | Admitting: Oncology

## 2022-03-04 ENCOUNTER — Other Ambulatory Visit (HOSPITAL_COMMUNITY): Payer: Self-pay

## 2022-03-04 DIAGNOSIS — C189 Malignant neoplasm of colon, unspecified: Secondary | ICD-10-CM

## 2022-03-04 MED ORDER — CAPECITABINE 500 MG PO TABS
500.0000 mg | ORAL_TABLET | Freq: Two times a day (BID) | ORAL | 0 refills | Status: DC
  Filled 2022-03-04: qty 28, 21d supply, fill #0

## 2022-03-04 NOTE — Telephone Encounter (Signed)
Component Ref Range & Units 1 mo ago ?(02/01/22) 1 mo ago ?(01/11/22) 2 mo ago ?(12/21/21) 3 mo ago ?(11/30/21) 3 mo ago ?(11/09/21) 4 mo ago ?(10/19/21) 4 mo ago ?(10/06/21)  ?WBC 4.0 - 10.5 K/uL 5.7  5.7  5.7  5.0  5.9  5.9  4.8   ?RBC 4.22 - 5.81 MIL/uL 4.13 Low   4.02 Low   4.21 Low   3.98 Low   4.04 Low   4.04 Low   3.75 Low    ?Hemoglobin 13.0 - 17.0 g/dL 13.7  13.5  13.9  13.4  13.6  13.4  12.2 Low    ?HCT 39.0 - 52.0 % 40.5  38.4 Low   40.2  38.5 Low   39.1  39.1  36.5 Low    ?MCV 80.0 - 100.0 fL 98.1  95.5  95.5  96.7  96.8  96.8  97.3   ?MCH 26.0 - 34.0 pg 33.2  33.6  33.0  33.7  33.7  33.2  32.5   ?MCHC 30.0 - 36.0 g/dL 33.8  35.2  34.6  34.8  34.8  34.3  33.4   ?RDW 11.5 - 15.5 % 15.6 High   15.9 High   16.0 High   15.7 High   16.3 High   19.3 High   22.9 High    ?Platelets 150 - 400 K/uL 123 Low   102 Low   90 Low  CM  97 Low  CM  88 Low   110 Low   72 Low  CM   ?Comment: SPECIMEN CHECKED FOR CLOTS  ?nRBC 0.0 - 0.2 % 0.0  0.0  0.0  0.0  0.0  0.0  0.0   ?Neutrophils Relative % % 51  57  54  51  54  57  55   ?Neutro Abs 1.7 - 7.7 K/uL 2.9  3.3  3.1  2.6  3.3  3.4  2.7   ?Lymphocytes Relative % 33  30  32  32  '30  29  29   '$ ?Lymphs Abs 0.7 - 4.0 K/uL 1.9  1.7  1.8  1.6  1.8  1.7  1.4   ?Monocytes Relative % '10  8  9  11  11  9  12   '$ ?Monocytes Absolute 0.1 - 1.0 K/uL 0.6  0.5  0.5  0.6  0.6  0.6  0.6   ?Eosinophils Relative % '4  3  3  4  3  3  2   '$ ?Eosinophils Absolute 0.0 - 0.5 K/uL 0.2  0.2  0.2  0.2  0.2  0.2  0.1   ?Basophils Relative % '1  1  1  1  1  1  1   '$ ?Basophils Absolute 0.0 - 0.1 K/uL 0.1  0.1  0.0  0.1  0.1  0.1  0.0   ?Immature Granulocytes % '1  1  1  1  1  1  1   '$ ?Abs Immature Granulocytes 0.00 - 0.07 K/uL 0.05  0.06 CM  0.03 CM  0.03 CM  0.04 CM  0.05 CM  0.03 CM   ?Comment: Performed at [facility], [address] [place], [place] [address]  ?Resulting Agency  [facility] [place] [facility] [place] [facility] [place] [facility] [place] [facility] [place] [facility] [place] [facility]  ?  ? ?  ?  ?Specimen Collected: 02/01/22  08:49 Last Resulted: 02/01/22 09:35  ?  ?  ? ?Component Ref Range &  Units 1 mo ago ?(02/01/22) 1 mo ago ?(01/11/22) 2 mo ago ?(12/21/21) 3 mo ago ?(11/30/21) 3 mo ago ?(11/09/21) 4 mo ago ?(10/19/21) 4 mo ago ?(10/06/21)  ?Sodium 135 - 145 mmol/L 134 Low   135  134 Low   136  136  135  135   ?Potassium 3.5 - 5.1 mmol/L 3.8  3.2 Low   3.3 Low   4.0  4.0  4.1  3.7   ?Chloride 98 - 111 mmol/L 106  104  102  107  105  103  104   ?CO2 22 - 32 mmol/L 20 Low   '22  23  23  27  24  25   '$ ?Glucose, Bld 70 - 99 mg/dL 122 High   168 High  CM  144 High  CM  127 High  CM  92 CM  103 High  CM  93 CM   ?Comment: Glucose reference range applies only to samples taken after fasting for at least 8 hours.  ?BUN 8 - 23 mg/dL 40 High   31 High   24 High   28 High   27 High   29 High   23   ?Creatinine, Ser 0.61 - 1.24 mg/dL 1.20  1.11  0.99  0.98  1.05  1.08  0.83   ?Calcium 8.9 - 10.3 mg/dL 8.8 Low   9.2  9.2  9.0  9.3  9.0  8.7 Low    ?Total Protein 6.5 - 8.1 g/dL 7.4  7.2  7.4  7.4  7.6  7.8  6.8   ?Albumin 3.5 - 5.0 g/dL 3.9  3.9  4.0  4.0  4.0  4.0  3.6   ?AST 15 - 41 U/L 31  31  37  38  40  42 High   35   ?ALT 0 - 44 U/L '20  21  27  25  28  27  22   '$ ?Alkaline Phosphatase 38 - 126 U/L 104  97  106  101  97  110  84   ?Total Bilirubin 0.3 - 1.2 mg/dL 0.4  0.6  0.8  0.8  0.6  0.8  1.0   ?GFR, Estimated >60 mL/min >60  >60 CM  >60 CM  >60 CM  >60 CM  >60 CM  >60 CM   ?Comment: (NOTE)  ?Calculated using the CKD-EPI Creatinine Equation (2021)   ?Anion gap 5 - '15 8  9 '$ CM  9 CM  6 CM  4 Low  CM  8 CM  6 CM   ?Comment: Performed at [facility], [address], [place], [place] [address]  ?Resulting Agency  [facility] [place] [facility] [place] [facility] [place] [facility] [place] [facility] [place] [facility] [place] [facility]  ?  ? ?  ?  ?Specimen Collected: 02/01/22 08:49 Last Resulted: 02/01/22 09:14  ?  ?  ?  ? ?

## 2022-03-05 ENCOUNTER — Other Ambulatory Visit (HOSPITAL_COMMUNITY): Payer: Self-pay

## 2022-03-08 ENCOUNTER — Ambulatory Visit
Admission: RE | Admit: 2022-03-08 | Discharge: 2022-03-08 | Disposition: A | Discharge status: Home / Self Care | Payer: Medicare HMO | Source: Ambulatory Visit | Attending: Oncology | Admitting: Oncology

## 2022-03-08 ENCOUNTER — Other Ambulatory Visit: Payer: Self-pay

## 2022-03-08 DIAGNOSIS — J432 Centrilobular emphysema: Secondary | ICD-10-CM | POA: Diagnosis not present

## 2022-03-08 DIAGNOSIS — C772 Secondary and unspecified malignant neoplasm of intra-abdominal lymph nodes: Secondary | ICD-10-CM | POA: Insufficient documentation

## 2022-03-08 DIAGNOSIS — K7689 Other specified diseases of liver: Secondary | ICD-10-CM | POA: Diagnosis not present

## 2022-03-08 DIAGNOSIS — C189 Malignant neoplasm of colon, unspecified: Secondary | ICD-10-CM | POA: Insufficient documentation

## 2022-03-08 DIAGNOSIS — K429 Umbilical hernia without obstruction or gangrene: Secondary | ICD-10-CM | POA: Diagnosis not present

## 2022-03-08 DIAGNOSIS — Z85038 Personal history of other malignant neoplasm of large intestine: Secondary | ICD-10-CM | POA: Diagnosis not present

## 2022-03-08 DIAGNOSIS — Z5111 Encounter for antineoplastic chemotherapy: Secondary | ICD-10-CM | POA: Insufficient documentation

## 2022-03-08 DIAGNOSIS — J9811 Atelectasis: Secondary | ICD-10-CM | POA: Diagnosis not present

## 2022-03-08 DIAGNOSIS — K5730 Diverticulosis of large intestine without perforation or abscess without bleeding: Secondary | ICD-10-CM | POA: Diagnosis not present

## 2022-03-08 DIAGNOSIS — I700 Atherosclerosis of aorta: Secondary | ICD-10-CM | POA: Diagnosis not present

## 2022-03-08 MED ORDER — IOHEXOL 300 MG/ML  SOLN
100.0000 mL | Freq: Once | INTRAMUSCULAR | Status: AC | PRN
  Administered 2022-03-08: 100 mL via INTRAVENOUS

## 2022-03-09 ENCOUNTER — Other Ambulatory Visit (HOSPITAL_COMMUNITY): Payer: Self-pay

## 2022-03-15 ENCOUNTER — Inpatient Hospital Stay: Payer: Medicare HMO | Attending: Oncology

## 2022-03-15 ENCOUNTER — Encounter: Payer: Self-pay | Admitting: Oncology

## 2022-03-15 ENCOUNTER — Inpatient Hospital Stay (HOSPITAL_BASED_OUTPATIENT_CLINIC_OR_DEPARTMENT_OTHER): Payer: Medicare HMO | Admitting: Oncology

## 2022-03-15 ENCOUNTER — Inpatient Hospital Stay: Payer: Medicare HMO

## 2022-03-15 VITALS — BP 119/66 | HR 82 | Temp 97.7°F | Resp 16 | Ht 67.0 in | Wt 159.5 lb

## 2022-03-15 DIAGNOSIS — G620 Drug-induced polyneuropathy: Secondary | ICD-10-CM | POA: Diagnosis not present

## 2022-03-15 DIAGNOSIS — T451X5A Adverse effect of antineoplastic and immunosuppressive drugs, initial encounter: Secondary | ICD-10-CM | POA: Diagnosis not present

## 2022-03-15 DIAGNOSIS — C772 Secondary and unspecified malignant neoplasm of intra-abdominal lymph nodes: Secondary | ICD-10-CM | POA: Diagnosis not present

## 2022-03-15 DIAGNOSIS — Z5112 Encounter for antineoplastic immunotherapy: Secondary | ICD-10-CM | POA: Diagnosis not present

## 2022-03-15 DIAGNOSIS — Z7189 Other specified counseling: Secondary | ICD-10-CM

## 2022-03-15 DIAGNOSIS — Z5111 Encounter for antineoplastic chemotherapy: Secondary | ICD-10-CM | POA: Insufficient documentation

## 2022-03-15 DIAGNOSIS — C189 Malignant neoplasm of colon, unspecified: Secondary | ICD-10-CM | POA: Diagnosis not present

## 2022-03-15 DIAGNOSIS — C187 Malignant neoplasm of sigmoid colon: Secondary | ICD-10-CM | POA: Diagnosis not present

## 2022-03-15 DIAGNOSIS — C786 Secondary malignant neoplasm of retroperitoneum and peritoneum: Secondary | ICD-10-CM | POA: Diagnosis not present

## 2022-03-15 LAB — CBC WITH DIFFERENTIAL/PLATELET
Abs Immature Granulocytes: 0.04 10*3/uL (ref 0.00–0.07)
Basophils Absolute: 0.1 10*3/uL (ref 0.0–0.1)
Basophils Relative: 1 %
Eosinophils Absolute: 0.3 10*3/uL (ref 0.0–0.5)
Eosinophils Relative: 3 %
HCT: 39.6 % (ref 39.0–52.0)
Hemoglobin: 13.9 g/dL (ref 13.0–17.0)
Immature Granulocytes: 1 %
Lymphocytes Relative: 24 %
Lymphs Abs: 2.1 10*3/uL (ref 0.7–4.0)
MCH: 33.8 pg (ref 26.0–34.0)
MCHC: 35.1 g/dL (ref 30.0–36.0)
MCV: 96.4 fL (ref 80.0–100.0)
Monocytes Absolute: 0.6 10*3/uL (ref 0.1–1.0)
Monocytes Relative: 7 %
Neutro Abs: 5.5 10*3/uL (ref 1.7–7.7)
Neutrophils Relative %: 64 %
Platelets: 125 10*3/uL — ABNORMAL LOW (ref 150–400)
RBC: 4.11 MIL/uL — ABNORMAL LOW (ref 4.22–5.81)
RDW: 14.6 % (ref 11.5–15.5)
WBC: 8.6 10*3/uL (ref 4.0–10.5)
nRBC: 0 % (ref 0.0–0.2)

## 2022-03-15 LAB — COMPREHENSIVE METABOLIC PANEL
ALT: 19 U/L (ref 0–44)
AST: 33 U/L (ref 15–41)
Albumin: 3.8 g/dL (ref 3.5–5.0)
Alkaline Phosphatase: 119 U/L (ref 38–126)
Anion gap: 10 (ref 5–15)
BUN: 38 mg/dL — ABNORMAL HIGH (ref 8–23)
CO2: 21 mmol/L — ABNORMAL LOW (ref 22–32)
Calcium: 8.9 mg/dL (ref 8.9–10.3)
Chloride: 105 mmol/L (ref 98–111)
Creatinine, Ser: 1.25 mg/dL — ABNORMAL HIGH (ref 0.61–1.24)
GFR, Estimated: >60 mL/min (ref >60)
Glucose, Bld: 140 mg/dL — ABNORMAL HIGH (ref 70–99)
Potassium: 3.6 mmol/L (ref 3.5–5.1)
Sodium: 136 mmol/L (ref 135–145)
Total Bilirubin: 0.5 mg/dL (ref 0.3–1.2)
Total Protein: 7 g/dL (ref 6.5–8.1)

## 2022-03-15 LAB — MAGNESIUM: Magnesium: 1.9 mg/dL (ref 1.7–2.4)

## 2022-03-15 MED ORDER — ATROPINE SULFATE 1 MG/ML IV SOLN
0.5000 mg | Freq: Once | INTRAVENOUS | Status: AC | PRN
  Administered 2022-03-15: 0.5 mg via INTRAVENOUS
  Filled 2022-03-15: qty 1

## 2022-03-15 MED ORDER — SODIUM CHLORIDE 0.9 % IV SOLN
6.0000 mg/kg | Freq: Once | INTRAVENOUS | Status: AC
  Administered 2022-03-15: 400 mg via INTRAVENOUS
  Filled 2022-03-15: qty 20

## 2022-03-15 MED ORDER — PALONOSETRON HCL INJECTION 0.25 MG/5ML
0.2500 mg | Freq: Once | INTRAVENOUS | Status: AC
  Administered 2022-03-15: 0.25 mg via INTRAVENOUS
  Filled 2022-03-15: qty 5

## 2022-03-15 MED ORDER — SODIUM CHLORIDE 0.9 % IV SOLN
125.0000 mg/m2 | Freq: Once | INTRAVENOUS | Status: AC
  Administered 2022-03-15: 220 mg via INTRAVENOUS
  Filled 2022-03-15: qty 5

## 2022-03-15 MED ORDER — HEPARIN SOD (PORK) LOCK FLUSH 100 UNIT/ML IV SOLN
500.0000 [IU] | Freq: Once | INTRAVENOUS | Status: AC | PRN
  Administered 2022-03-15: 500 [IU]
  Filled 2022-03-15: qty 5

## 2022-03-15 MED ORDER — SODIUM CHLORIDE 0.9 % IV SOLN
Freq: Once | INTRAVENOUS | Status: AC
  Filled 2022-03-15: qty 250

## 2022-03-15 MED ORDER — SODIUM CHLORIDE 0.9 % IV SOLN
10.0000 mg | Freq: Once | INTRAVENOUS | Status: AC
  Administered 2022-03-15: 10 mg via INTRAVENOUS
  Filled 2022-03-15: qty 10

## 2022-03-15 NOTE — Progress Notes (Signed)
? ? ? ?Hematology/Oncology Consult note ?[place]  ?[phone] [phone] ? ?Patient Care Team: ?Jodi Marble, MD as PCP - General (Internal Medicine) ?Clent Jacks, RN as Oncology Nurse Navigator ?Sindy Guadeloupe, MD as Consulting Physician (Hematology and Oncology)  ? ?Name of the patient: Eric Simon  ?[id number]  ?1950/12/14  ? ?Date of visit: 03/15/22 ? 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 ?Chief complaint/ Reason for visit-discuss CT scan results and on treatment assessment prior to cycle 10 of irinotecan panitumumab chemotherapy ? ?Heme/Onc history:  Patient is a 71 yr old male with >40 pack year history of smoking. He currently smokes 0.5ppd. he presented to the ER with symptoms of left sided chest pain and left arm pain. Troponin was negative ekg was unremarkable. Ct chest showed no PE. He was incidentally noted to have mediastinal and retrocrural adenopathy and a 2.1X2.3X4.4 cm retroaortic soft tissue lesion in the posterior left chest. He has been referred for further work up ? 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  Low-grade activity in the left lateral fifth and sixth ribs associated with nondisplaced fractures likely reflecting healing response problem malignancy. ?  ?Patient underwent CT-guided biopsy of the retroperitoneal lymph node pathology showed metastatic adenocarcinoma compatible with colorectal origin.  CK7 negative.  CK20 positive.  CDX 2+.  TTF-1 negative.  PSA negative.  This pattern of immunoreactivity supports the above diagnosis. ?Patient underwent colonoscopy which showed sigmoid mass that was consistent with adenocarcinoma.  RAS panel testing showed that he was wild-type for both K-ras and BRAF ?  ?FOLFOX and bevacizumab chemotherapy started in July 2020.  Subsequently oxaliplatin has been on  hold given neuropathy.  Recent scans have shown slow increase in the size of intra-abdominal adenopathy but patient continues to have low-volume disease.plan is to switch him to second line Xeloda irinotecan panitumumab  regimen ?  ?  ? ?Interval history-patient is tolerating treatment well.  Appetite is good and he is putting on weight.  Denies any problems with his bowel habits.  Denies any blood in his stool.  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 ? ?Review of systems- Review of Systems  ?Constitutional:  Negative for chills, fever, malaise/fatigue and weight loss.  ?HENT:  Negative for congestion, ear discharge and nosebleeds.   ?Eyes:  Negative for blurred vision.  ?Respiratory:  Negative for cough, hemoptysis, sputum production, shortness of breath and wheezing.   ?Cardiovascular:  Negative for chest pain, palpitations, orthopnea and claudication.  ?Gastrointestinal:  Negative for abdominal pain, blood in stool, constipation, diarrhea, heartburn, melena, nausea and vomiting.  ?Genitourinary:  Negative for dysuria, flank pain, frequency, hematuria and urgency.  ?Musculoskeletal:  Negative for back pain, joint pain and myalgias.  ?Skin:  Negative for rash.  ?Neurological:  Negative for dizziness, tingling, focal weakness, seizures, weakness and headaches.  ?Endo/Heme/Allergies:  Does not bruise/bleed easily.  ?Psychiatric/Behavioral:  Negative for depression and suicidal ideas. The patient does not have insomnia.    ? ? ? ?No Known Allergies ? ? ?Past Medical History:  ?Diagnosis Date  ? Colon cancer ([place])   ? COPD (chronic obstructive pulmonary disease) ([place])   ? Hyperlipidemia   ? ? ? ?Past Surgical History:  ?Procedure Laterality Date  ? COLONOSCOPY WITH PROPOFOL N/A 06/26/2019  ? Procedure: COLONOSCOPY WITH PROPOFOL;  Surgeon: [name], MD;  Location: [facility] ENDOSCOPY;  Service: Gastroenterology;  Laterality: N/A;  ? FLEXIBLE SIGMOIDOSCOPY N/A 05/07/2021  ? Procedure: FLEXIBLE  SIGMOIDOSCOPY;  Surgeon: [name], MD;  Location: [facility] ENDOSCOPY;  Service: Gastroenterology;  Laterality: N/A;  ? PORTA CATH INSERTION N/A 06/25/2019  ? Procedure: PORTA CATH INSERTION;  Surgeon: Algernon Huxley, MD;  Location: [place] CV LAB;  Service: Cardiovascular;  Laterality: N/A;  ? PORTA CATH INSERTION Left 08/11/2020  ? Procedure: PORTA CATH INSERTION;  Surgeon: Algernon Huxley, MD;  Location: [place] CV LAB;  Service: Cardiovascular;  Laterality: Left;  ? PORTA CATH REMOVAL Right 08/11/2020  ? Procedure: PORTA CATH REMOVAL;  Surgeon: Algernon Huxley, MD;  Location: [place] CV LAB;  Service: Cardiovascular;  Laterality: Right;  ? ? ?Social History  ? ?Socioeconomic History  ? Marital status: Single  ?  Spouse name: Not on file  ? Number of children: Not on file  ? Years of education: Not on file  ? Highest education level: Not on file  ?Occupational History  ? Not on file  ?Tobacco Use  ? Smoking status: Every Day  ?  Packs/day: 0.50  ?  Years: 40.00  ?  Pack years: 20.00  ?  Types: Cigarettes  ? Smokeless tobacco: Never  ? Tobacco comments:  ?  smoking 40 years  ?Vaping Use  ? Vaping Use: Never used  ?Substance and Sexual Activity  ? Alcohol use: No  ? Drug use: No  ? Sexual activity: Not Currently  ?Other Topics Concern  ? Not on file  ?Social History Narrative  ? Not on file  ? ?Social Determinants of Health  ? ?Financial Resource Strain: Not on file  ?Food Insecurity: Not on file  ?Transportation Needs: Not on file  ?Physical Activity: Not on file  ?Stress: Not on file  ?Social Connections: Not on file  ?Intimate Partner Violence: Not on file  ? ? ?Family History  ?Problem Relation Age of Onset  ? Brain cancer Father   ? ? ? ?Current Outpatient Medications:  ?  allopurinol (ZYLOPRIM) 100 MG tablet, Take 100 mg by mouth daily., Disp: , Rfl:  ?  aspirin EC 81 MG tablet, Take 81 mg by mouth daily., Disp: , Rfl:  ?  azelastine (ASTELIN) 0.1 % nasal spray, Place 1 spray into both nostrils daily as  needed., Disp: , Rfl:  ?  butalbital-acetaminophen-caffeine (FIORICET) 50-325-40 MG tablet, Take 1 tablet by mouth every 4 (four) hours as needed., Disp: , Rfl:  ?  capecitabine (XELODA) 500 MG tablet, Take 1 tablet (500 mg total) by mouth 2 (two) times daily after a meal. Take for 14 days, then hold for 7 days. Repeat every 21 days., Disp: 28 tablet, Rfl: 0 ?  cetirizine (ZYRTEC) 10 MG tablet, Take 1 tablet (10 mg total) by mouth daily., Disp: 30 tablet, Rfl: 0 ?  chlorthalidone (HYGROTON) 25 MG tablet, Take 1 tablet by mouth daily., Disp: , Rfl:  ?  DEXILANT 60 MG capsule, Take 60 mg by mouth daily., Disp: , Rfl:  ?  DULoxetine (CYMBALTA) 30 MG capsule, Take 1 capsule (30 mg total) by mouth 2 (two) times daily., Disp: 60 capsule, Rfl: 1 ?  fluticasone (FLONASE) 50 MCG/ACT nasal spray, Place 2 sprays into both nostrils daily as needed., Disp: , Rfl:  ?  fluticasone (VERAMYST) 27.5 MCG/SPRAY nasal spray, Place 2 sprays into the nose daily as needed., Disp: , Rfl:  ?  lisinopril (ZESTRIL) 5 MG tablet, Take 5 mg by mouth daily., Disp: , Rfl:  ?  loperamide (IMODIUM A-D) 2 MG tablet, Take 1 tablet (2 mg total) by mouth 4 (four) times daily as needed. Take 2 at diarrhea onset , then 1 every 2hr until 12hrs with no BM. May take 2 every 4hrs at night. If diarrhea recurs repeat., Disp: 100 tablet, Rfl: 1 ?  magic mouthwash (multi-ingredient) oral suspension, Swish and spit 5-10 mls by mouth 4 times a day as needed (Patient not taking: Reported on 01/11/2022), Disp: 480 mL, Rfl: 3 ?  magic mouthwash w/lidocaine SOLN, Take 5-10 mLs by mouth 4 (four) times daily as needed for mouth pain. (Patient not taking: Reported on 01/11/2022), Disp: 480 mL, Rfl: 3 ?  montelukast (SINGULAIR) 10 MG tablet, Take 10 mg by mouth at bedtime., Disp: , Rfl:  ?  NEXLETOL 180 MG TABS, Take 1 tablet by mouth daily., Disp: , Rfl:  ?  oxyCODONE (OXY IR/ROXICODONE) 5 MG immediate release tablet, Take 1 tablet (5 mg total) by mouth every 6 (six) hours  as needed for severe pain. (Patient not taking: Reported on 01/11/2022), Disp: 30 tablet, Rfl: 0 ?  potassium chloride SA (KLOR-CON M) 20 MEQ tablet, Take 1 tablet (20 mEq total) by mouth daily., Disp:

## 2022-03-15 NOTE — Patient Instructions (Signed)
[facility] AT [place]  Discharge Instructions: ?Thank you for choosing [facility] to provide your oncology and hematology care.  ?If you have a lab appointment with the [facility], please go directly to the [facility] and check in at the registration area. ? ?Wear comfortable clothing and clothing appropriate for easy access to any Portacath or PICC line.  ? ?We strive to give you quality time with your provider. You may need to reschedule your appointment if you arrive late (15 or more minutes).  Arriving late affects you and other patients whose appointments are after yours.  Also, if you miss three or more appointments without notifying the office, you may be dismissed from the clinic at the provider?s discretion.    ?  ?For prescription refill requests, have your pharmacy contact our office and allow 72 hours for refills to be completed.   ? ?Today you received the following chemotherapy and/or immunotherapy agents: Vectibix, Irinotecan    ?  ?To help prevent nausea and vomiting after your treatment, we encourage you to take your nausea medication as directed. ? ?BELOW ARE SYMPTOMS THAT SHOULD BE REPORTED IMMEDIATELY: ?*FEVER GREATER THAN 100.4 F (38 ?C) OR HIGHER ?*CHILLS OR SWEATING ?*NAUSEA AND VOMITING THAT IS NOT CONTROLLED WITH YOUR NAUSEA MEDICATION ?*UNUSUAL SHORTNESS OF BREATH ?*UNUSUAL BRUISING OR BLEEDING ?*URINARY PROBLEMS (pain or burning when urinating, or frequent urination) ?*BOWEL PROBLEMS (unusual diarrhea, constipation, pain near the anus) ?TENDERNESS IN MOUTH AND THROAT WITH OR WITHOUT PRESENCE OF ULCERS (sore throat, sores in mouth, or a toothache) ?UNUSUAL RASH, SWELLING OR PAIN  ?UNUSUAL VAGINAL DISCHARGE OR ITCHING  ? ?Items with * indicate a potential emergency and should be followed up as soon as possible or go to the Emergency Department if any problems should occur. ? ?Please show the CHEMOTHERAPY ALERT CARD or IMMUNOTHERAPY ALERT CARD at  check-in to the Emergency Department and triage nurse. ? ?Should you have questions after your visit or need to cancel or reschedule your appointment, please contact [facility] [place] AT [place]  [phone] and follow the prompts.  Office hours are 8:00 a.m. to 4:30 p.m. Monday - Friday. Please note that voicemails left after 4:00 p.m. may not be returned until the following business day.  We are closed weekends and major holidays. You have access to a nurse at all times for urgent questions. Please call the main number to the clinic [phone] and follow the prompts. ? ?For any non-urgent questions, you may also contact your provider using MyChart. We now offer e-Visits for anyone 2 and older to request care online for non-urgent symptoms. For details visit [email or web address]. ?  ?Also download the MyChart app! Go to the app store, search "MyChart", open the app, select [facility], and log in with your MyChart username and password. ? ?Due to Covid, a mask is required upon entering the hospital/clinic. If you do not have a mask, one will be given to you upon arrival. For doctor visits, patients may have 1 support person aged 53 or older with them. For treatment visits, patients cannot have anyone with them due to current Covid guidelines and our immunocompromised population. Irinotecan injection ?What is this medication? ?IRINOTECAN (ir in oh TEE kan ) is a chemotherapy drug. It is used to treat colon and rectal cancer. ?This medicine may be used for other purposes; ask your health care provider or pharmacist if you have questions. ?COMMON BRAND NAME(S): Camptosar ?What should I tell my care team  before I take this medication? ?They need to know if you have any of these conditions: ?dehydration ?diarrhea ?infection (especially a virus infection such as chickenpox, cold sores, or herpes) ?liver disease ?low blood counts, like low white cell, platelet, or red cell counts ?low levels of  calcium, magnesium, or potassium in the blood ?recent or ongoing radiation therapy ?an unusual or allergic reaction to irinotecan, other medicines, foods, dyes, or preservatives ?pregnant or trying to get pregnant ?breast-feeding ?How should I use this medication? ?This drug is given as an infusion into a vein. It is administered in a hospital or clinic by a specially trained health care professional. ?Talk to your pediatrician regarding the use of this medicine in children. Special care may be needed. ?Overdosage: If you think you have taken too much of this medicine contact a poison control center or emergency room at once. ?NOTE: This medicine is only for you. Do not share this medicine with others. ?What if I miss a dose? ?It is important not to miss your dose. Call your doctor or health care professional if you are unable to keep an appointment. ?What may interact with this medication? ?Do not take this medicine with any of the following medications: ?cobicistat ?itraconazole ?This medicine may interact with the following medications: ?antiviral medicines for HIV or AIDS ?certain antibiotics like rifampin or rifabutin ?certain medicines for fungal infections like ketoconazole, posaconazole, and voriconazole ?certain medicines for seizures like carbamazepine, phenobarbital, phenotoin ?clarithromycin ?gemfibrozil ?nefazodone ?[place] ?This list may not describe all possible interactions. Give your health care provider a list of all the medicines, herbs, non-prescription drugs, or dietary supplements you use. Also tell them if you smoke, drink alcohol, or use illegal drugs. Some items may interact with your medicine. ?What should I watch for while using this medication? ?Your condition will be monitored carefully while you are receiving this medicine. You will need important blood work done while you are taking this medicine. ?This drug may make you feel generally unwell. This is not uncommon, as  chemotherapy can affect healthy cells as well as cancer cells. Report any side effects. Continue your course of treatment even though you feel ill unless your doctor tells you to stop. ?In some cases, you may be given additional medicines to help with side effects. Follow all directions for their use. ?You may get drowsy or dizzy. Do not drive, use machinery, or do anything that needs mental alertness until you know how this medicine affects you. Do not stand or sit up quickly, especially if you are an older patient. This reduces the risk of dizzy or fainting spells. ?Call your health care professional for advice if you get a fever, chills, or sore throat, or other symptoms of a cold or flu. Do not treat yourself. This medicine decreases your body's ability to fight infections. Try to avoid being around people who are sick. ?Avoid taking products that contain aspirin, acetaminophen, ibuprofen, naproxen, or ketoprofen unless instructed by your doctor. These medicines may hide a fever. ?This medicine may increase your risk to bruise or bleed. Call your doctor or health care professional if you notice any unusual bleeding. ?Be careful brushing and flossing your teeth or using a toothpick because you may get an infection or bleed more easily. If you have any dental work done, tell your dentist you are receiving this medicine. ?Do not become pregnant while taking this medicine or for 6 months after stopping it. Women should inform their health care professional if  they wish to become pregnant or think they might be pregnant. Men should not father a child while taking this medicine and for 3 months after stopping it. There is potential for serious side effects to an unborn child. Talk to your health care professional for more information. ?Do not breast-feed an infant while taking this medicine or for 7 days after stopping it. ?This medicine has caused ovarian failure in some women. This medicine may make it more  difficult to get pregnant. Talk to your health care professional if you are concerned about your fertility. ?This medicine has caused decreased sperm counts in some men. This may make it more difficult to father a child. T

## 2022-03-16 LAB — CEA: CEA: 10.5 ng/mL — ABNORMAL HIGH (ref 0.0–4.7)

## 2022-03-17 ENCOUNTER — Encounter: Payer: Self-pay | Admitting: Oncology

## 2022-03-29 ENCOUNTER — Other Ambulatory Visit: Payer: Self-pay | Admitting: Oncology

## 2022-03-29 ENCOUNTER — Other Ambulatory Visit (HOSPITAL_COMMUNITY): Payer: Self-pay

## 2022-03-29 DIAGNOSIS — C189 Malignant neoplasm of colon, unspecified: Secondary | ICD-10-CM

## 2022-03-29 MED ORDER — CAPECITABINE 500 MG PO TABS
500.0000 mg | ORAL_TABLET | Freq: Two times a day (BID) | ORAL | 0 refills | Status: DC
  Filled 2022-03-29: qty 28, 21d supply, fill #0

## 2022-03-29 NOTE — Telephone Encounter (Signed)
CBC with Differential ?Order: [id number] ?Status: Final result    ?Visible to patient: Yes (seen)    ?Next appt: 04/05/2022 at 08:15 AM in Oncology ([facility])    ?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    ?0 Result Notes ?          ?Component Ref Range & Units 2 wk ago ?(03/15/22) 1 mo ago ?(02/01/22) 2 mo ago ?(01/11/22) 3 mo ago ?(12/21/21) 3 mo ago ?(11/30/21) 4 mo ago ?(11/09/21) 5 mo ago ?(10/19/21)  ?WBC 4.0 - 10.5 K/uL 8.6  5.7  5.7  5.7  5.0  5.9  5.9   ?RBC 4.22 - 5.81 MIL/uL 4.11 Low   4.13 Low   4.02 Low   4.21 Low   3.98 Low   4.04 Low   4.04 Low    ?Hemoglobin 13.0 - 17.0 g/dL 13.9  13.7  13.5  13.9  13.4  13.6  13.4   ?HCT 39.0 - 52.0 % 39.6  40.5  38.4 Low   40.2  38.5 Low   39.1  39.1   ?MCV 80.0 - 100.0 fL 96.4  98.1  95.5  95.5  96.7  96.8  96.8   ?MCH 26.0 - 34.0 pg 33.8  33.2  33.6  33.0  33.7  33.7  33.2   ?MCHC 30.0 - 36.0 g/dL 35.1  33.8  35.2  34.6  34.8  34.8  34.3   ?RDW 11.5 - 15.5 % 14.6  15.6 High   15.9 High   16.0 High   15.7 High   16.3 High   19.3 High    ?Platelets 150 - 400 K/uL 125 Low   123 Low  CM  102 Low   90 Low  CM  97 Low  CM  88 Low   110 Low    ?nRBC 0.0 - 0.2 % 0.0  0.0  0.0  0.0  0.0  0.0  0.0   ?Neutrophils Relative % % 64  51  57  54  51  54  57   ?Neutro Abs 1.7 - 7.7 K/uL 5.5  2.9  3.3  3.1  2.6  3.3  3.4   ?Lymphocytes Relative % 24  33  30  32  32  30  29   ?Lymphs Abs 0.7 - 4.0 K/uL 2.1  1.9  1.7  1.8  1.6  1.8  1.7   ?Monocytes Relative % '7  10  8  9  11  11  9   '$ ?Monocytes Absolute 0.1 - 1.0 K/uL 0.6  0.6  0.5  0.5  0.6  0.6  0.6   ?Eosinophils Relative % '3  4  3  3  4  3  3   '$ ?Eosinophils Absolute 0.0 - 0.5 K/uL 0.3  0.2  0.2  0.2  0.2  0.2  0.2   ?Basophils Relative % '1  1  1  1  1  1  1   '$ ?Basophils Absolute 0.0 - 0.1 K/uL 0.1  0.1  0.1  0.0  0.1  0.1  0.1   ?Immature Granulocytes % '1  1  1  1  1  1  1   '$ ?Abs Immature Granulocytes 0.00 - 0.07 K/uL 0.04  0.05 CM  0.06 CM  0.03 CM  0.03 CM  0.04 CM  0.05 CM   ?Comment: Performed at [facility],  [address] [place], [place] [address]  ?Resulting Agency  [place] [facility] [place] [facility]  LAB [place] [facility] [place] [facility]  ?  ? ?  ?  ?Specimen Collected: 03/15/22 08:01 Last Resulted: 03/15/22 08:20  ?  ?  Lab Flowsheet   ? Order Details   ? View Encounter   ? Lab and Collection Details   ? Routing   ? Result History    ?View Encounter Conversation    ?  ?CM=Additional comments    ?  ?Result Care Coordination ? ? ?Patient Communication ? ? Add Comments   Seen Back to Top  ?  ?  ? ?Other Results from 03/15/2022 ? ?Magnesium ?Order: [id number] ?Status: Final result    ?Visible to patient: Yes (seen)    ?Next appt: 04/05/2022 at 08:15 AM in Oncology ([facility])    ?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    ?0 Result Notes ?          ?Component Ref Range & Units 2 wk ago ?(03/15/22) 1 mo ago ?(02/01/22) 2 mo ago ?(01/11/22) 3 mo ago ?(12/21/21) 3 mo ago ?(11/30/21) 4 mo ago ?(11/09/21) 5 mo ago ?(10/19/21)  ?Magnesium 1.7 - 2.4 mg/dL 1.9  2.1 CM  1.8 CM  1.8 CM  2.1 CM  2.1 CM  2.1 CM   ?Comment: Performed at [facility], [address], [place], [place] [address]  ?Resulting Agency  [place] [facility] [place] [facility] [place] [facility] [place] [facility] [place] [facility] [place] [facility] [place] [facility]  ?  ? ?  ?  ?Specimen Collected: 03/15/22 08:01 Last Resulted: 03/15/22 08:30  ?  ?  Lab Flowsheet   ? Order Details   ? View Encounter   ? Lab and Collection Details   ? Routing   ? Result History    ?View Encounter Conversation    ?  ?CM=Additional comments    ?  ?Result Care Coordination ? ? ?Patient Communication ? ? Add Comments   Seen Back to Top  ?  ?  ? ?  ? Contains abnormal data Comprehensive metabolic panel ?Order: [id number] ?Status: Final result    ?Visible to patient: Yes (seen)    ?Next appt: 04/05/2022 at 08:15 AM in Oncology ([facility])    ?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    ?0 Result Notes ?          ?Component Ref Range & Units 2 wk ago ?(03/15/22) 1 mo ago ?(02/01/22) 2 mo ago ?(01/11/22)  3 mo ago ?(12/21/21) 3 mo ago ?(11/30/21) 4 mo ago ?(11/09/21) 5 mo ago ?(10/19/21)  ?Sodium 135 - 145 mmol/L 136  134 Low   135  134 Low   136  136  135   ?Potassium 3.5 - 5.1 mmol/L 3.6  3.8  3.2 Low   3.3 Low   4.0  4.0  4.1   ?Chloride 98 - 111 mmol/L 105  106  104  102  107  105  103   ?CO2 22 - 32 mmol/L 21 Low   20 Low   '22  23  23  27  24   '$ ?Glucose, Bld 70 - 99 mg/dL 140 High   122 High  CM  168 High  CM  144 High  CM  127 High  CM  92 CM  103 High  CM   ?Comment: Glucose reference range applies only to samples taken after fasting for at least 8 hours.  ?BUN 8 - 23 mg/dL 38  High   40 High   31 High   24 High   28 High   27 High   29 High    ?Creatinine, Ser 0.61 - 1.24 mg/dL 1.25 High   1.20  1.11  0.99  0.98  1.05  1.08   ?Calcium 8.9 - 10.3 mg/dL 8.9  8.8 Low   9.2  9.2  9.0  9.3  9.0   ?Total Protein 6.5 - 8.1 g/dL 7.0  7.4  7.2  7.4  7.4  7.6  7.8   ?Albumin 3.5 - 5.0 g/dL 3.8  3.9  3.9  4.0  4.0  4.0  4.0   ?AST 15 - 41 U/L 33  31  31  37  38  40  42 High    ?ALT 0 - 44 U/L '19  20  21  27  25  28  27   '$ ?Alkaline Phosphatase 38 - 126 U/L 119  104  97  106  101  97  110   ?Total Bilirubin 0.3 - 1.2 mg/dL 0.5  0.4  0.6  0.8  0.8  0.6  0.8   ?GFR, Estimated >60 mL/min >60  >60 CM  >60 CM  >60 CM  >60 CM  >60 CM  >60 CM   ?Comment: (NOTE)  ?Calculated using the CKD-EPI Creatinine Equation (2021)   ?Anion gap 5 - '15 10  8 '$ CM  9 CM  9 CM  6 CM  4 Low  CM  8 CM   ?Comment: Performed at [facility], [address], [place], [place] [address]  ?Resulting Agency  [facility] [facility] [place] [facility] [place] [facility] [place] [facility] [place] [facility] [place] [facility] [place] [facility]  ?  ? ?  ?  ?Specimen Collected: 03/15/22 08:01 Last Resulted: 03/15/22 08:30  ?  ?  Lab Flowsheet   ? Order Details   ? View Encounter   ? Lab and Collection Details   ? Routing   ? Result History    ?View Encounter Conversation    ?  ?CM=Additional comments    ?  ?Result Care Coordination ? ? ?Patient Communication ? ? Add Comments   Seen Back to Top  ?  ?  ? ?  ?  Contains abnormal data CEA ?Order: [id number] ?Status: Final result    ?Visible to patient: Yes (seen)    ?Next appt: 04/05/2022 at 08:15 AM in Oncology ([facility])    ?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    ?0 Result Notes ?          ?Component Ref Range & Units 2 wk ago ?(03/15/22) 1 mo ago ?(02/01/22) 2 mo ago ?(01/11/22) 4 mo ago ?(11/09/21) 5 mo ago ?(10/19/21) 5 mo ago ?(10/06/21) 7 mo ago ?(08/24/21)  ?CEA 0.0 - 4.7 ng/mL 10.5 High   4.2 CM  3.7 CM  4.3 CM  6.1 High  CM  3.9 CM  4.6 CM   ?Comment: (NOTE)  ?                            Nonsmokers          <3.9  ?                            Smokers             <5.6   ?  ? ?

## 2022-03-30 ENCOUNTER — Other Ambulatory Visit (HOSPITAL_COMMUNITY): Payer: Self-pay

## 2022-03-31 ENCOUNTER — Other Ambulatory Visit (HOSPITAL_COMMUNITY): Payer: Self-pay

## 2022-04-04 ENCOUNTER — Other Ambulatory Visit: Payer: Self-pay | Admitting: *Deleted

## 2022-04-05 ENCOUNTER — Inpatient Hospital Stay (HOSPITAL_BASED_OUTPATIENT_CLINIC_OR_DEPARTMENT_OTHER): Payer: Medicare HMO | Admitting: Oncology

## 2022-04-05 ENCOUNTER — Inpatient Hospital Stay: Payer: Medicare HMO

## 2022-04-05 ENCOUNTER — Encounter: Payer: Self-pay | Admitting: Oncology

## 2022-04-05 VITALS — BP 122/65 | HR 72 | Temp 97.8°F | Resp 16 | Ht 67.0 in | Wt 160.7 lb

## 2022-04-05 DIAGNOSIS — G620 Drug-induced polyneuropathy: Secondary | ICD-10-CM | POA: Diagnosis not present

## 2022-04-05 DIAGNOSIS — Z5112 Encounter for antineoplastic immunotherapy: Secondary | ICD-10-CM

## 2022-04-05 DIAGNOSIS — Z5111 Encounter for antineoplastic chemotherapy: Secondary | ICD-10-CM

## 2022-04-05 DIAGNOSIS — C772 Secondary and unspecified malignant neoplasm of intra-abdominal lymph nodes: Secondary | ICD-10-CM

## 2022-04-05 DIAGNOSIS — C189 Malignant neoplasm of colon, unspecified: Secondary | ICD-10-CM

## 2022-04-05 DIAGNOSIS — Z7189 Other specified counseling: Secondary | ICD-10-CM | POA: Diagnosis not present

## 2022-04-05 DIAGNOSIS — C786 Secondary malignant neoplasm of retroperitoneum and peritoneum: Secondary | ICD-10-CM | POA: Diagnosis not present

## 2022-04-05 DIAGNOSIS — C187 Malignant neoplasm of sigmoid colon: Secondary | ICD-10-CM | POA: Diagnosis not present

## 2022-04-05 LAB — CBC WITH DIFFERENTIAL/PLATELET
Abs Immature Granulocytes: 0.05 10*3/uL (ref 0.00–0.07)
Basophils Absolute: 0 10*3/uL (ref 0.0–0.1)
Basophils Relative: 1 %
Eosinophils Absolute: 0.2 10*3/uL (ref 0.0–0.5)
Eosinophils Relative: 3 %
HCT: 37.7 % — ABNORMAL LOW (ref 39.0–52.0)
Hemoglobin: 13 g/dL (ref 13.0–17.0)
Immature Granulocytes: 1 %
Lymphocytes Relative: 26 %
Lymphs Abs: 1.6 10*3/uL (ref 0.7–4.0)
MCH: 33.3 pg (ref 26.0–34.0)
MCHC: 34.5 g/dL (ref 30.0–36.0)
MCV: 96.7 fL (ref 80.0–100.0)
Monocytes Absolute: 0.4 10*3/uL (ref 0.1–1.0)
Monocytes Relative: 7 %
Neutro Abs: 3.9 10*3/uL (ref 1.7–7.7)
Neutrophils Relative %: 62 %
Platelets: 106 10*3/uL — ABNORMAL LOW (ref 150–400)
RBC: 3.9 MIL/uL — ABNORMAL LOW (ref 4.22–5.81)
RDW: 14.9 % (ref 11.5–15.5)
WBC: 6.2 10*3/uL (ref 4.0–10.5)
nRBC: 0 % (ref 0.0–0.2)

## 2022-04-05 LAB — COMPREHENSIVE METABOLIC PANEL
ALT: 23 U/L (ref 0–44)
AST: 32 U/L (ref 15–41)
Albumin: 3.5 g/dL (ref 3.5–5.0)
Alkaline Phosphatase: 119 U/L (ref 38–126)
Anion gap: 5 (ref 5–15)
BUN: 30 mg/dL — ABNORMAL HIGH (ref 8–23)
CO2: 22 mmol/L (ref 22–32)
Calcium: 8.8 mg/dL — ABNORMAL LOW (ref 8.9–10.3)
Chloride: 109 mmol/L (ref 98–111)
Creatinine, Ser: 0.92 mg/dL (ref 0.61–1.24)
GFR, Estimated: >60 mL/min (ref >60)
Glucose, Bld: 102 mg/dL — ABNORMAL HIGH (ref 70–99)
Potassium: 3.8 mmol/L (ref 3.5–5.1)
Sodium: 136 mmol/L (ref 135–145)
Total Bilirubin: 0.9 mg/dL (ref 0.3–1.2)
Total Protein: 6.8 g/dL (ref 6.5–8.1)

## 2022-04-05 LAB — MAGNESIUM: Magnesium: 1.8 mg/dL (ref 1.7–2.4)

## 2022-04-05 MED ORDER — SODIUM CHLORIDE 0.9 % IV SOLN
6.0000 mg/kg | Freq: Once | INTRAVENOUS | Status: AC
  Administered 2022-04-05: 400 mg via INTRAVENOUS
  Filled 2022-04-05: qty 20

## 2022-04-05 MED ORDER — HEPARIN SOD (PORK) LOCK FLUSH 100 UNIT/ML IV SOLN
INTRAVENOUS | Status: AC
  Filled 2022-04-05: qty 5

## 2022-04-05 MED ORDER — SODIUM CHLORIDE 0.9 % IV SOLN
125.0000 mg/m2 | Freq: Once | INTRAVENOUS | Status: AC
  Administered 2022-04-05: 220 mg via INTRAVENOUS
  Filled 2022-04-05: qty 5

## 2022-04-05 MED ORDER — ATROPINE SULFATE 1 MG/ML IV SOLN
0.5000 mg | Freq: Once | INTRAVENOUS | Status: AC | PRN
  Administered 2022-04-05: 0.5 mg via INTRAVENOUS
  Filled 2022-04-05: qty 1

## 2022-04-05 MED ORDER — PALONOSETRON HCL INJECTION 0.25 MG/5ML
0.2500 mg | Freq: Once | INTRAVENOUS | Status: AC
  Administered 2022-04-05: 0.25 mg via INTRAVENOUS
  Filled 2022-04-05: qty 5

## 2022-04-05 MED ORDER — HEPARIN SOD (PORK) LOCK FLUSH 100 UNIT/ML IV SOLN
500.0000 [IU] | Freq: Once | INTRAVENOUS | Status: AC | PRN
  Administered 2022-04-05: 500 [IU]
  Filled 2022-04-05: qty 5

## 2022-04-05 MED ORDER — SODIUM CHLORIDE 0.9 % IV SOLN
10.0000 mg | Freq: Once | INTRAVENOUS | Status: AC
  Administered 2022-04-05: 10 mg via INTRAVENOUS
  Filled 2022-04-05: qty 10

## 2022-04-05 MED ORDER — SODIUM CHLORIDE 0.9 % IV SOLN
Freq: Once | INTRAVENOUS | Status: AC
  Filled 2022-04-05: qty 250

## 2022-04-05 NOTE — Progress Notes (Signed)
? ? ? ?Hematology/Oncology Consult note ?[place]  ?[phone] [phone] ? ?Patient Care Team: ?Jodi Marble, MD as PCP - General (Internal Medicine) ?Clent Jacks, RN as Oncology Nurse Navigator ?Sindy Guadeloupe, MD as Consulting Physician (Hematology and Oncology)  ? ?Name of the patient: Eric Simon  ?[id number]  ?February 06, 1951  ? ?Date of visit: 04/05/22 ? 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  ? ?Chief complaint/ Reason for visit-on treatment assessment prior to cycle 11 of irinotecan panitumumab chemotherapy ? ?Heme/Onc history: Patient is a 71 yr old male with >40 pack year history of smoking. He currently smokes 0.5ppd. he presented to the ER with symptoms of left sided chest pain and left arm pain. Troponin was negative ekg was unremarkable. Ct chest showed no PE. He was incidentally noted to have mediastinal and retrocrural adenopathy and a 2.1X2.3X4.4 cm retroaortic soft tissue lesion in the posterior left chest. He has been referred for further work up ? 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  Low-grade activity in the left lateral fifth and sixth ribs associated with nondisplaced fractures likely reflecting healing response problem malignancy. ?  ?Patient underwent CT-guided biopsy of the retroperitoneal lymph node pathology showed metastatic adenocarcinoma compatible with colorectal origin.  CK7 negative.  CK20 positive.  CDX 2+.  TTF-1 negative.  PSA negative.  This pattern of immunoreactivity supports the above diagnosis. ?Patient underwent colonoscopy which showed sigmoid mass that was consistent with adenocarcinoma.  RAS panel testing showed that he was wild-type for both K-ras and BRAF ?  ?FOLFOX and bevacizumab chemotherapy started in July 2020.  Subsequently oxaliplatin has been on hold given neuropathy.   Recent scans have shown slow increase in the size of intra-abdominal adenopathy but patient continues to have low-volume disease.plan is to switch him to second line Xeloda irinotecan panitumumab  regimen ?  ? ?Interval history-tolerating treatment well so far without any significant nausea or vomiting.  Appetite is good and weight has remained stable.  Bowel movements are regular.  Denies any abdominal pain ? ?ECOG PS- 1 ?Pain scale- 0 ? ? ?Review of systems- Review of Systems  ?Constitutional:  Negative for chills, fever, malaise/fatigue and weight loss.  ?HENT:  Negative for congestion, ear discharge and nosebleeds.   ?Eyes:  Negative for blurred vision.  ?Respiratory:  Negative for cough, hemoptysis, sputum production, shortness of breath and wheezing.   ?Cardiovascular:  Negative for chest pain, palpitations, orthopnea and claudication.  ?Gastrointestinal:  Negative for abdominal pain, blood in stool, constipation, diarrhea, heartburn, melena, nausea and vomiting.  ?Genitourinary:  Negative for dysuria, flank pain, frequency, hematuria and urgency.  ?Musculoskeletal:  Negative for back pain, joint pain and myalgias.  ?Skin:  Negative for rash.  ?Neurological:  Negative for dizziness, tingling, focal weakness, seizures, weakness and headaches.  ?Endo/Heme/Allergies:  Does not bruise/bleed easily.  ?Psychiatric/Behavioral:  Negative for depression and suicidal ideas. The patient does not have insomnia.    ? ? ? ?No Known Allergies ? ? ?Past Medical History:  ?Diagnosis Date  ? Colon cancer ([place])   ? COPD (chronic obstructive pulmonary disease) ([place])   ? Hyperlipidemia   ? ? ? ?Past Surgical History:  ?Procedure Laterality Date  ? COLONOSCOPY WITH PROPOFOL N/A 06/26/2019  ? Procedure: COLONOSCOPY WITH PROPOFOL;  Surgeon: [name], MD;  Location: [facility] ENDOSCOPY;  Service: Gastroenterology;  Laterality: N/A;  ? FLEXIBLE SIGMOIDOSCOPY N/A 05/07/2021  ?  Procedure: FLEXIBLE SIGMOIDOSCOPY;  Surgeon: [name], MD;   Location: [facility] ENDOSCOPY;  Service: Gastroenterology;  Laterality: N/A;  ? PORTA CATH INSERTION N/A 06/25/2019  ? Procedure: PORTA CATH INSERTION;  Surgeon: Algernon Huxley, MD;  Location: [facility] CV LAB;  Service: Cardiovascular;  Laterality: N/A;  ? PORTA CATH INSERTION Left 08/11/2020  ? Procedure: PORTA CATH INSERTION;  Surgeon: Algernon Huxley, MD;  Location: [facility] LAB;  Service: Cardiovascular;  Laterality: Left;  ? PORTA CATH REMOVAL Right 08/11/2020  ? Procedure: PORTA CATH REMOVAL;  Surgeon: Algernon Huxley, MD;  Location: [facility] CV LAB;  Service: Cardiovascular;  Laterality: Right;  ? ? ?Social History  ? ?Socioeconomic History  ? Marital status: Single  ?  Spouse name: Not on file  ? Number of children: Not on file  ? Years of education: Not on file  ? Highest education level: Not on file  ?Occupational History  ? Not on file  ?Tobacco Use  ? Smoking status: Every Day  ?  Packs/day: 0.50  ?  Years: 40.00  ?  Pack years: 20.00  ?  Types: Cigarettes  ? Smokeless tobacco: Never  ? Tobacco comments:  ?  smoking 40 years  ?Vaping Use  ? Vaping Use: Never used  ?Substance and Sexual Activity  ? Alcohol use: No  ? Drug use: No  ? Sexual activity: Not Currently  ?Other Topics Concern  ? Not on file  ?Social History Narrative  ? Not on file  ? ?Social Determinants of Health  ? ?Financial Resource Strain: Not on file  ?Food Insecurity: Not on file  ?Transportation Needs: Not on file  ?Physical Activity: Not on file  ?Stress: Not on file  ?Social Connections: Not on file  ?Intimate Partner Violence: Not on file  ? ? ?Family History  ?Problem Relation Age of Onset  ? Brain cancer Father   ? ? ? ?Current Outpatient Medications:  ?  allopurinol (ZYLOPRIM) 100 MG tablet, Take 100 mg by mouth daily., Disp: , Rfl:  ?  aspirin EC 81 MG tablet, Take 81 mg by mouth daily., Disp: , Rfl:  ?  azelastine (ASTELIN) 0.1 % nasal spray, Place 1 spray into both nostrils daily as needed., Disp: , Rfl:  ?   butalbital-acetaminophen-caffeine (FIORICET) 50-325-40 MG tablet, Take 1 tablet by mouth every 4 (four) hours as needed., Disp: , Rfl:  ?  capecitabine (XELODA) 500 MG tablet, Take 1 tablet (500 mg total) by mouth 2 (two) times daily after a meal. Take for 14 days, then hold for 7 days. Repeat every 21 days., Disp: 28 tablet, Rfl: 0 ?  cetirizine (ZYRTEC) 10 MG tablet, Take 1 tablet (10 mg total) by mouth daily., Disp: 30 tablet, Rfl: 0 ?  chlorthalidone (HYGROTON) 25 MG tablet, Take 1 tablet by mouth daily., Disp: , Rfl:  ?  DEXILANT 60 MG capsule, Take 60 mg by mouth daily., Disp: , Rfl:  ?  DULoxetine (CYMBALTA) 30 MG capsule, Take 1 capsule (30 mg total) by mouth 2 (two) times daily., Disp: 60 capsule, Rfl: 1 ?  fluticasone (FLONASE) 50 MCG/ACT nasal spray, Place 2 sprays into both nostrils daily as needed., Disp: , Rfl:  ?  fluticasone (VERAMYST) 27.5 MCG/SPRAY nasal spray, Place 2 sprays into the nose daily as needed., Disp: , Rfl:  ?  lisinopril (ZESTRIL) 5 MG tablet, Take 5 mg by mouth daily., Disp: , Rfl:  ?  loperamide (IMODIUM A-D) 2 MG tablet, Take 1 tablet (  2 mg total) by mouth 4 (four) times daily as needed. Take 2 at diarrhea onset , then 1 every 2hr until 12hrs with no BM. May take 2 every 4hrs at night. If diarrhea recurs repeat., Disp: 100 tablet, Rfl: 1 ?  montelukast (SINGULAIR) 10 MG tablet, Take 10 mg by mouth at bedtime., Disp: , Rfl:  ?  NEXLETOL 180 MG TABS, Take 1 tablet by mouth daily., Disp: , Rfl:  ?  pregabalin (LYRICA) 75 MG capsule, Take 1 capsule (75 mg total) by mouth 2 (two) times daily., Disp: 60 capsule, Rfl: 2 ?  simvastatin (ZOCOR) 20 MG tablet, Take 20 mg by mouth daily. , Disp: , Rfl:  ?  SYMBICORT 80-4.5 MCG/ACT inhaler, Inhale 2 puffs into the lungs daily., Disp: , Rfl:  ?  tamsulosin (FLOMAX) 0.4 MG CAPS capsule, Take 0.4 mg by mouth. , Disp: , Rfl:  ?  magic mouthwash (multi-ingredient) oral suspension, Swish and spit 5-10 mls by mouth 4 times a day as needed (Patient not  taking: Reported on 04/05/2022), Disp: 480 mL, Rfl: 3 ?  oxyCODONE (OXY IR/ROXICODONE) 5 MG immediate release tablet, Take 1 tablet (5 mg total) by mouth every 6 (six) hours as needed for severe pain. (Patient not taking

## 2022-04-05 NOTE — Progress Notes (Signed)
Allergies from pollen not good for him , no other concerns ?

## 2022-04-05 NOTE — Patient Instructions (Signed)
[facility] CANCER CTR AT [place]  Discharge Instructions: ?Thank you for choosing [facility] to provide your oncology and hematology care.  ?If you have a lab appointment with the [facility], please go directly to the [facility] and check in at the registration area. ? ?Wear comfortable clothing and clothing appropriate for easy access to any Portacath or PICC line.  ? ?We strive to give you quality time with your provider. You may need to reschedule your appointment if you arrive late (15 or more minutes).  Arriving late affects you and other patients whose appointments are after yours.  Also, if you miss three or more appointments without notifying the office, you may be dismissed from the clinic at the provider?s discretion.    ?  ?For prescription refill requests, have your pharmacy contact our office and allow 72 hours for refills to be completed.   ? ?Today you received the following chemotherapy and/or immunotherapy agents Irinotecan and Panitimumab     ?  ?To help prevent nausea and vomiting after your treatment, we encourage you to take your nausea medication as directed. ? ?BELOW ARE SYMPTOMS THAT SHOULD BE REPORTED IMMEDIATELY: ?*FEVER GREATER THAN 100.4 F (38 ?C) OR HIGHER ?*CHILLS OR SWEATING ?*NAUSEA AND VOMITING THAT IS NOT CONTROLLED WITH YOUR NAUSEA MEDICATION ?*UNUSUAL SHORTNESS OF BREATH ?*UNUSUAL BRUISING OR BLEEDING ?*URINARY PROBLEMS (pain or burning when urinating, or frequent urination) ?*BOWEL PROBLEMS (unusual diarrhea, constipation, pain near the anus) ?TENDERNESS IN MOUTH AND THROAT WITH OR WITHOUT PRESENCE OF ULCERS (sore throat, sores in mouth, or a toothache) ?UNUSUAL RASH, SWELLING OR PAIN  ?UNUSUAL VAGINAL DISCHARGE OR ITCHING  ? ?Items with * indicate a potential emergency and should be followed up as soon as possible or go to the Emergency Department if any problems should occur. ? ?Please show the CHEMOTHERAPY ALERT CARD or IMMUNOTHERAPY ALERT  CARD at check-in to the Emergency Department and triage nurse. ? ?Should you have questions after your visit or need to cancel or reschedule your appointment, please contact [facility] CANCER [place] AT [place]  [phone] and follow the prompts.  Office hours are 8:00 a.m. to 4:30 p.m. Monday - Friday. Please note that voicemails left after 4:00 p.m. may not be returned until the following business day.  We are closed weekends and major holidays. You have access to a nurse at all times for urgent questions. Please call the main number to the clinic [phone] and follow the prompts. ? ?For any non-urgent questions, you may also contact your provider using MyChart. We now offer e-Visits for anyone 58 and older to request care online for non-urgent symptoms. For details visit [email or web address]. ?  ?Also download the MyChart app! Go to the app store, search "MyChart", open the app, select [facility], and log in with your MyChart username and password. ? ?Due to Covid, a mask is required upon entering the hospital/clinic. If you do not have a mask, one will be given to you upon arrival. For doctor visits, patients may have 1 support person aged 6 or older with them. For treatment visits, patients cannot have anyone with them due to current Covid guidelines and our immunocompromised population.  ?

## 2022-04-06 DIAGNOSIS — I340 Nonrheumatic mitral (valve) insufficiency: Secondary | ICD-10-CM | POA: Diagnosis not present

## 2022-04-06 DIAGNOSIS — I10 Essential (primary) hypertension: Secondary | ICD-10-CM | POA: Diagnosis not present

## 2022-04-06 DIAGNOSIS — J449 Chronic obstructive pulmonary disease, unspecified: Secondary | ICD-10-CM | POA: Diagnosis not present

## 2022-04-06 DIAGNOSIS — E782 Mixed hyperlipidemia: Secondary | ICD-10-CM | POA: Diagnosis not present

## 2022-04-06 DIAGNOSIS — I2510 Atherosclerotic heart disease of native coronary artery without angina pectoris: Secondary | ICD-10-CM | POA: Diagnosis not present

## 2022-04-06 DIAGNOSIS — F17200 Nicotine dependence, unspecified, uncomplicated: Secondary | ICD-10-CM | POA: Diagnosis not present

## 2022-04-06 DIAGNOSIS — G44019 Episodic cluster headache, not intractable: Secondary | ICD-10-CM | POA: Diagnosis not present

## 2022-04-06 LAB — CEA: CEA: 4.5 ng/mL (ref 0.0–4.7)

## 2022-04-09 ENCOUNTER — Other Ambulatory Visit: Payer: Self-pay | Admitting: *Deleted

## 2022-04-09 MED ORDER — PREGABALIN 75 MG PO CAPS: 75.0000 mg | ORAL_CAPSULE | Freq: Two times a day (BID) | ORAL | 2 refills | Status: DC

## 2022-04-13 ENCOUNTER — Other Ambulatory Visit (HOSPITAL_COMMUNITY): Payer: Self-pay

## 2022-04-20 DIAGNOSIS — I2510 Atherosclerotic heart disease of native coronary artery without angina pectoris: Secondary | ICD-10-CM | POA: Diagnosis not present

## 2022-04-20 DIAGNOSIS — I10 Essential (primary) hypertension: Secondary | ICD-10-CM | POA: Diagnosis not present

## 2022-04-20 DIAGNOSIS — E790 Hyperuricemia without signs of inflammatory arthritis and tophaceous disease: Secondary | ICD-10-CM | POA: Diagnosis not present

## 2022-04-20 DIAGNOSIS — F17210 Nicotine dependence, cigarettes, uncomplicated: Secondary | ICD-10-CM | POA: Diagnosis not present

## 2022-04-20 DIAGNOSIS — E785 Hyperlipidemia, unspecified: Secondary | ICD-10-CM | POA: Diagnosis not present

## 2022-04-20 DIAGNOSIS — F17200 Nicotine dependence, unspecified, uncomplicated: Secondary | ICD-10-CM | POA: Diagnosis not present

## 2022-04-20 DIAGNOSIS — J301 Allergic rhinitis due to pollen: Secondary | ICD-10-CM | POA: Diagnosis not present

## 2022-04-20 DIAGNOSIS — I340 Nonrheumatic mitral (valve) insufficiency: Secondary | ICD-10-CM | POA: Diagnosis not present

## 2022-04-20 DIAGNOSIS — J449 Chronic obstructive pulmonary disease, unspecified: Secondary | ICD-10-CM | POA: Diagnosis not present

## 2022-04-20 DIAGNOSIS — J019 Acute sinusitis, unspecified: Secondary | ICD-10-CM | POA: Diagnosis not present

## 2022-04-20 DIAGNOSIS — K219 Gastro-esophageal reflux disease without esophagitis: Secondary | ICD-10-CM | POA: Diagnosis not present

## 2022-04-22 ENCOUNTER — Other Ambulatory Visit (HOSPITAL_COMMUNITY): Payer: Self-pay

## 2022-04-22 ENCOUNTER — Other Ambulatory Visit: Payer: Self-pay | Admitting: Oncology

## 2022-04-22 DIAGNOSIS — C189 Malignant neoplasm of colon, unspecified: Secondary | ICD-10-CM

## 2022-04-22 MED ORDER — CAPECITABINE 500 MG PO TABS
500.0000 mg | ORAL_TABLET | Freq: Two times a day (BID) | ORAL | 0 refills | Status: DC
  Filled 2022-04-22: qty 28, 21d supply, fill #0

## 2022-04-22 NOTE — Telephone Encounter (Signed)
CBC with Differential ?Order: [id number] ?Status: Final result    ?Visible to patient: Yes (seen)    ?Next appt: 04/26/2022 at 08:15 AM in Oncology ([facility])    ?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    ?0 Result Notes ?          ?Component Ref Range & Units 2 wk ago ?(04/05/22) 1 mo ago ?(03/15/22) 2 mo ago ?(02/01/22) 3 mo ago ?(01/11/22) 4 mo ago ?(12/21/21) 4 mo ago ?(11/30/21) 5 mo ago ?(11/09/21)  ?WBC 4.0 - 10.5 K/uL 6.2  8.6  5.7  5.7  5.7  5.0  5.9   ?RBC 4.22 - 5.81 MIL/uL 3.90 Low   4.11 Low   4.13 Low   4.02 Low   4.21 Low   3.98 Low   4.04 Low    ?Hemoglobin 13.0 - 17.0 g/dL 13.0  13.9  13.7  13.5  13.9  13.4  13.6   ?HCT 39.0 - 52.0 % 37.7 Low   39.6  40.5  38.4 Low   40.2  38.5 Low   39.1   ?MCV 80.0 - 100.0 fL 96.7  96.4  98.1  95.5  95.5  96.7  96.8   ?MCH 26.0 - 34.0 pg 33.3  33.8  33.2  33.6  33.0  33.7  33.7   ?MCHC 30.0 - 36.0 g/dL 34.5  35.1  33.8  35.2  34.6  34.8  34.8   ?RDW 11.5 - 15.5 % 14.9  14.6  15.6 High   15.9 High   16.0 High   15.7 High   16.3 High    ?Platelets 150 - 400 K/uL 106 Low   125 Low   123 Low  CM  102 Low   90 Low  CM  97 Low  CM  88 Low    ?Comment: SPECIMEN CHECKED FOR CLOTS  ?nRBC 0.0 - 0.2 % 0.0  0.0  0.0  0.0  0.0  0.0  0.0   ?Neutrophils Relative % % 62  64  51  57  54  51  54   ?Neutro Abs 1.7 - 7.7 K/uL 3.9  5.5  2.9  3.3  3.1  2.6  3.3   ?Lymphocytes Relative % 26  24  33  30  32  32  30   ?Lymphs Abs 0.7 - 4.0 K/uL 1.6  2.1  1.9  1.7  1.8  1.6  1.8   ?Monocytes Relative % '7  7  10  8  9  11  11   '$ ?Monocytes Absolute 0.1 - 1.0 K/uL 0.4  0.6  0.6  0.5  0.5  0.6  0.6   ?Eosinophils Relative % '3  3  4  3  3  4  3   '$ ?Eosinophils Absolute 0.0 - 0.5 K/uL 0.2  0.3  0.2  0.2  0.2  0.2  0.2   ?Basophils Relative % '1  1  1  1  1  1  1   '$ ?Basophils Absolute 0.0 - 0.1 K/uL 0.0  0.1  0.1  0.1  0.0  0.1  0.1   ?Immature Granulocytes % '1  1  1  1  1  1  1   '$ ?Abs Immature Granulocytes 0.00 - 0.07 K/uL 0.05  0.04 CM  0.05 CM  0.06 CM  0.03 CM  0.03 CM  0.04 CM   ?Comment:  Performed at [facility], [address] [place], [place] [address]  ?Resulting Agency  [place] [facility] [place] [facility] [place] [facility] [place] [facility] [place] [facility] [place] [facility] [place] [facility]  ?  ? ?  ?  ?Specimen Collected: 04/05/22 08:16 Last Resulted: 04/05/22 08:48  ?  ?  Lab Flowsheet   ? Order Details   ? View Encounter   ? Lab and Collection Details   ? Routing   ? Result History    ?View Encounter Conversation    ?  ?CM=Additional comments    ?  ?Result Care Coordination ? ? ?Patient Communication ? ? Add Comments   Seen Back to Top  ?  ?  ? ?Other Results from 04/05/2022 ? ?Magnesium ?Order: [id number] ?Status: Final result    ?Visible to patient: Yes (seen)    ?Next appt: 04/26/2022 at 08:15 AM in Oncology ([facility])    ?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    ?0 Result Notes ?          ?Component Ref Range & Units 2 wk ago ?(04/05/22) 1 mo ago ?(03/15/22) 2 mo ago ?(02/01/22) 3 mo ago ?(01/11/22) 4 mo ago ?(12/21/21) 4 mo ago ?(11/30/21) 5 mo ago ?(11/09/21)  ?Magnesium 1.7 - 2.4 mg/dL 1.8  1.9 CM  2.1 CM  1.8 CM  1.8 CM  2.1 CM  2.1 CM   ?Comment: Performed at [facility], [address] [place], [place] [address]  ?Resulting Agency  [place] [facility] [place] [facility] [place] [facility] [place] [facility] [place] [facility] [place] [facility] [place] [facility]  ?  ? ?  ?  ?Specimen Collected: 04/05/22 08:16 Last Resulted: 04/05/22 09:23  ?  ?  Lab Flowsheet   ? Order Details   ? View Encounter   ? Lab and Collection Details   ? Routing   ? Result History    ?View Encounter Conversation    ?  ?CM=Additional comments    ?  ?Result Care Coordination ? ? ?Patient Communication ? ? Add Comments   Seen Back to Top  ?  ?  ? ?  ? Contains abnormal data Comprehensive metabolic panel ?Order: [id number] ?Status: Edited Result - FINAL    ?Visible to patient: Yes (seen)    ?Next appt: 04/26/2022 at 08:15 AM in Oncology ([facility])    ?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    ?0 Result Notes ?          ?Component Ref Range & Units 2 wk  ago ?(04/05/22) 1 mo ago ?(03/15/22) 2 mo ago ?(02/01/22) 3 mo ago ?(01/11/22) 4 mo ago ?(12/21/21) 4 mo ago ?(11/30/21) 5 mo ago ?(11/09/21)  ?Sodium 135 - 145 mmol/L 136 VC  136  134 Low   135  134 Low   136  136   ?Potassium 3.5 - 5.1 mmol/L 3.8 VC  3.6  3.8  3.2 Low   3.3 Low   4.0  4.0   ?Chloride 98 - 111 mmol/L 109 VC  105  106  104  102  107  105   ?CO2 22 - 32 mmol/L 22 VC  21 Low   20 Low   '22  23  23  27   '$ ?Glucose, Bld 70 - 99 mg/dL 102 High   140 High  CM  122 High  CM  168 High  CM  144 High  CM  127 High  CM  92 CM   ?Comment: Glucose reference range applies only to samples taken after fasting for  at least 8 hours.  ?BUN 8 - 23 mg/dL 30 High   38 High   40 High   31 High   24 High   28 High   27 High    ?Creatinine, Ser 0.61 - 1.24 mg/dL 0.92  1.25 High   1.20  1.11  0.99  0.98  1.05   ?Calcium 8.9 - 10.3 mg/dL 8.8 Low   8.9  8.8 Low   9.2  9.2  9.0  9.3   ?Total Protein 6.5 - 8.1 g/dL 6.8  7.0  7.4  7.2  7.4  7.4  7.6   ?Albumin 3.5 - 5.0 g/dL 3.5  3.8  3.9  3.9  4.0  4.0  4.0   ?AST 15 - 41 U/L 32  33  31  31  37  38  40   ?ALT 0 - 44 U/L '23  19  20  21  27  25  28   '$ ?Alkaline Phosphatase 38 - 126 U/L 119  119  104  97  106  101  97   ?Total Bilirubin 0.3 - 1.2 mg/dL 0.9  0.5  0.4  0.6  0.8  0.8  0.6   ?GFR, Estimated >60 mL/min >60  >60 CM  >60 CM  >60 CM  >60 CM  >60 CM  >60 CM   ?Comment: (NOTE)  ?Calculated using the CKD-EPI Creatinine Equation (2021)   ?Anion gap 5 - 15 5 VC  10 CM  8 CM  9 CM  9 CM  6 CM  4 Low  CM   ?Comment: Performed at [facility], [address] [place], [place] [address]  ?Resulting Agency  [place] [facility] [place] [facility] [place] [facility] [place] [facility] [place] [facility] [place] [facility] [place] [facility]  ?  ? ?  ?  ?Specimen Collected: 04/05/22 08:16 Last Resulted: 04/05/22 09:23  ?  ?  ?  ? ?

## 2022-04-23 DIAGNOSIS — J449 Chronic obstructive pulmonary disease, unspecified: Secondary | ICD-10-CM | POA: Diagnosis not present

## 2022-04-23 DIAGNOSIS — E782 Mixed hyperlipidemia: Secondary | ICD-10-CM | POA: Diagnosis not present

## 2022-04-23 DIAGNOSIS — I340 Nonrheumatic mitral (valve) insufficiency: Secondary | ICD-10-CM | POA: Diagnosis not present

## 2022-04-23 DIAGNOSIS — I77819 Aortic ectasia, unspecified site: Secondary | ICD-10-CM | POA: Diagnosis not present

## 2022-04-23 DIAGNOSIS — F17200 Nicotine dependence, unspecified, uncomplicated: Secondary | ICD-10-CM | POA: Diagnosis not present

## 2022-04-23 DIAGNOSIS — I10 Essential (primary) hypertension: Secondary | ICD-10-CM | POA: Diagnosis not present

## 2022-04-23 MED FILL — Dexamethasone Sodium Phosphate Inj 100 MG/10ML: INTRAMUSCULAR | Qty: 1 | Status: AC

## 2022-04-26 ENCOUNTER — Encounter: Payer: Self-pay | Admitting: Oncology

## 2022-04-26 ENCOUNTER — Inpatient Hospital Stay: Payer: Medicare HMO

## 2022-04-26 ENCOUNTER — Inpatient Hospital Stay: Payer: Medicare HMO | Attending: Oncology

## 2022-04-26 ENCOUNTER — Inpatient Hospital Stay (HOSPITAL_BASED_OUTPATIENT_CLINIC_OR_DEPARTMENT_OTHER): Payer: Medicare HMO | Admitting: Oncology

## 2022-04-26 VITALS — BP 112/70 | HR 72 | Temp 96.7°F | Resp 16 | Wt 159.0 lb

## 2022-04-26 DIAGNOSIS — Z79899 Other long term (current) drug therapy: Secondary | ICD-10-CM | POA: Insufficient documentation

## 2022-04-26 DIAGNOSIS — F17210 Nicotine dependence, cigarettes, uncomplicated: Secondary | ICD-10-CM | POA: Insufficient documentation

## 2022-04-26 DIAGNOSIS — Z7951 Long term (current) use of inhaled steroids: Secondary | ICD-10-CM | POA: Insufficient documentation

## 2022-04-26 DIAGNOSIS — C772 Secondary and unspecified malignant neoplasm of intra-abdominal lymph nodes: Secondary | ICD-10-CM | POA: Diagnosis not present

## 2022-04-26 DIAGNOSIS — Z7982 Long term (current) use of aspirin: Secondary | ICD-10-CM | POA: Insufficient documentation

## 2022-04-26 DIAGNOSIS — G629 Polyneuropathy, unspecified: Secondary | ICD-10-CM | POA: Insufficient documentation

## 2022-04-26 DIAGNOSIS — C189 Malignant neoplasm of colon, unspecified: Secondary | ICD-10-CM | POA: Diagnosis not present

## 2022-04-26 DIAGNOSIS — Z298 Encounter for other specified prophylactic measures: Secondary | ICD-10-CM

## 2022-04-26 DIAGNOSIS — Z5111 Encounter for antineoplastic chemotherapy: Secondary | ICD-10-CM | POA: Insufficient documentation

## 2022-04-26 DIAGNOSIS — Z5112 Encounter for antineoplastic immunotherapy: Secondary | ICD-10-CM

## 2022-04-26 DIAGNOSIS — C778 Secondary and unspecified malignant neoplasm of lymph nodes of multiple regions: Secondary | ICD-10-CM | POA: Diagnosis not present

## 2022-04-26 DIAGNOSIS — K5900 Constipation, unspecified: Secondary | ICD-10-CM | POA: Diagnosis not present

## 2022-04-26 LAB — CBC WITH DIFFERENTIAL/PLATELET
Abs Immature Granulocytes: 0.06 10*3/uL (ref 0.00–0.07)
Basophils Absolute: 0.1 10*3/uL (ref 0.0–0.1)
Basophils Relative: 1 %
Eosinophils Absolute: 0.2 10*3/uL (ref 0.0–0.5)
Eosinophils Relative: 3 %
HCT: 40.1 % (ref 39.0–52.0)
Hemoglobin: 14 g/dL (ref 13.0–17.0)
Immature Granulocytes: 1 %
Lymphocytes Relative: 28 %
Lymphs Abs: 1.9 10*3/uL (ref 0.7–4.0)
MCH: 33.3 pg (ref 26.0–34.0)
MCHC: 34.9 g/dL (ref 30.0–36.0)
MCV: 95.2 fL (ref 80.0–100.0)
Monocytes Absolute: 0.6 10*3/uL (ref 0.1–1.0)
Monocytes Relative: 9 %
Neutro Abs: 3.9 10*3/uL (ref 1.7–7.7)
Neutrophils Relative %: 58 %
Platelets: 108 10*3/uL — ABNORMAL LOW (ref 150–400)
RBC: 4.21 MIL/uL — ABNORMAL LOW (ref 4.22–5.81)
RDW: 15.2 % (ref 11.5–15.5)
WBC: 6.7 10*3/uL (ref 4.0–10.5)
nRBC: 0 % (ref 0.0–0.2)

## 2022-04-26 LAB — COMPREHENSIVE METABOLIC PANEL
ALT: 20 U/L (ref 0–44)
AST: 28 U/L (ref 15–41)
Albumin: 3.8 g/dL (ref 3.5–5.0)
Alkaline Phosphatase: 131 U/L — ABNORMAL HIGH (ref 38–126)
Anion gap: 9 (ref 5–15)
BUN: 32 mg/dL — ABNORMAL HIGH (ref 8–23)
CO2: 22 mmol/L (ref 22–32)
Calcium: 9.3 mg/dL (ref 8.9–10.3)
Chloride: 104 mmol/L (ref 98–111)
Creatinine, Ser: 1.1 mg/dL (ref 0.61–1.24)
GFR, Estimated: >60 mL/min (ref >60)
Glucose, Bld: 93 mg/dL (ref 70–99)
Potassium: 3.7 mmol/L (ref 3.5–5.1)
Sodium: 135 mmol/L (ref 135–145)
Total Bilirubin: 0.7 mg/dL (ref 0.3–1.2)
Total Protein: 7.3 g/dL (ref 6.5–8.1)

## 2022-04-26 LAB — MAGNESIUM: Magnesium: 2 mg/dL (ref 1.7–2.4)

## 2022-04-26 MED ORDER — HEPARIN SOD (PORK) LOCK FLUSH 100 UNIT/ML IV SOLN
500.0000 [IU] | Freq: Once | INTRAVENOUS | Status: DC
  Filled 2022-04-26: qty 5

## 2022-04-26 MED ORDER — SODIUM CHLORIDE 0.9 % IV SOLN
6.0000 mg/kg | Freq: Once | INTRAVENOUS | Status: AC
  Administered 2022-04-26: 400 mg via INTRAVENOUS
  Filled 2022-04-26: qty 20

## 2022-04-26 MED ORDER — HEPARIN SOD (PORK) LOCK FLUSH 100 UNIT/ML IV SOLN
500.0000 [IU] | Freq: Once | INTRAVENOUS | Status: AC | PRN
  Administered 2022-04-26: 500 [IU]
  Filled 2022-04-26: qty 5

## 2022-04-26 MED ORDER — SODIUM CHLORIDE 0.9% FLUSH
10.0000 mL | INTRAVENOUS | Status: DC | PRN
  Administered 2022-04-26: 10 mL via INTRAVENOUS
  Filled 2022-04-26: qty 10

## 2022-04-26 MED ORDER — SODIUM CHLORIDE 0.9 % IV SOLN
Freq: Once | INTRAVENOUS | Status: AC
  Filled 2022-04-26: qty 250

## 2022-04-26 MED ORDER — SODIUM CHLORIDE 0.9 % IV SOLN
10.0000 mg | Freq: Once | INTRAVENOUS | Status: AC
  Administered 2022-04-26: 10 mg via INTRAVENOUS
  Filled 2022-04-26: qty 10

## 2022-04-26 MED ORDER — PALONOSETRON HCL INJECTION 0.25 MG/5ML
0.2500 mg | Freq: Once | INTRAVENOUS | Status: AC
  Administered 2022-04-26: 0.25 mg via INTRAVENOUS
  Filled 2022-04-26: qty 5

## 2022-04-26 MED ORDER — ATROPINE SULFATE 1 MG/ML IV SOLN
0.5000 mg | Freq: Once | INTRAVENOUS | Status: AC | PRN
  Administered 2022-04-26: 0.5 mg via INTRAVENOUS
  Filled 2022-04-26: qty 1

## 2022-04-26 MED ORDER — SODIUM CHLORIDE 0.9 % IV SOLN
125.0000 mg/m2 | Freq: Once | INTRAVENOUS | Status: AC
  Administered 2022-04-26: 220 mg via INTRAVENOUS
  Filled 2022-04-26: qty 5

## 2022-04-26 NOTE — Progress Notes (Signed)
? ? ? ?Hematology/Oncology Consult note ?[place]  ?[phone] [phone] ? ?Patient Care Team: ?Jodi Marble, MD as PCP - General (Internal Medicine) ?Clent Jacks, RN as Oncology Nurse Navigator ?Sindy Guadeloupe, MD as Consulting Physician (Hematology and Oncology)  ? ?Name of the patient: Eric Simon  ?[id number]  ?[birth year]  ? ?Date of visit: 04/26/22 ? 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  ? ?Chief complaint/ Reason for visit-on treatment assessment prior to cycle 12 of irinotecan panitumumab chemotherapy ? ?Heme/Onc history: Patient is a 71 yr old male with >40 pack year history of smoking. He currently smokes 0.5ppd. he presented to the ER with symptoms of left sided chest pain and left arm pain. Troponin was negative ekg was unremarkable. Ct chest showed no PE. He was incidentally noted to have mediastinal and retrocrural adenopathy and a 2.1X2.3X4.4 cm retroaortic soft tissue lesion in the posterior left chest. He has been referred for further work up ? 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  Low-grade activity in the left lateral fifth and sixth ribs associated with nondisplaced fractures likely reflecting healing response problem malignancy. ?  ?Patient underwent CT-guided biopsy of the retroperitoneal lymph node pathology showed metastatic adenocarcinoma compatible with colorectal origin.  CK7 negative.  CK20 positive.  CDX 2+.  TTF-1 negative.  PSA negative.  This pattern of immunoreactivity supports the above diagnosis. ?Patient underwent colonoscopy which showed sigmoid mass that was consistent with adenocarcinoma.  RAS panel testing showed that he was wild-type for both K-ras and BRAF ?  ?FOLFOX and bevacizumab chemotherapy started in July 2020.  Subsequently oxaliplatin has been on hold given neuropathy.   Recent scans have shown slow increase in the size of intra-abdominal adenopathy but patient continues to have low-volume disease.plan is to switch him to second line Xeloda irinotecan panitumumab  regimen ?  ?  ? ?Interval history-tolerating chemotherapy well so far without any significant side effects.  Currently reports ongoing constipation for which she takes MiraLAX but states that its not helping ? ?ECOG PS- 1 ?Pain scale- 0 ? ?Review of systems- Review of Systems  ?Constitutional:  Negative for chills, fever, malaise/fatigue and weight loss.  ?HENT:  Negative for congestion, ear discharge and nosebleeds.   ?Eyes:  Negative for blurred vision.  ?Respiratory:  Negative for cough, hemoptysis, sputum production, shortness of breath and wheezing.   ?Cardiovascular:  Negative for chest pain, palpitations, orthopnea and claudication.  ?Gastrointestinal:  Positive for constipation. Negative for abdominal pain, blood in stool, diarrhea, heartburn, melena, nausea and vomiting.  ?Genitourinary:  Negative for dysuria, flank pain, frequency, hematuria and urgency.  ?Musculoskeletal:  Negative for back pain, joint pain and myalgias.  ?Skin:  Negative for rash.  ?Neurological:  Negative for dizziness, tingling, focal weakness, seizures, weakness and headaches.  ?Endo/Heme/Allergies:  Does not bruise/bleed easily.  ?Psychiatric/Behavioral:  Negative for depression and suicidal ideas. The patient does not have insomnia.    ? ? ? ?No Known Allergies ? ? ?Past Medical History:  ?Diagnosis Date  ? Colon cancer ([place])   ? COPD (chronic obstructive pulmonary disease) ([place])   ? Hyperlipidemia   ? ? ? ?Past Surgical History:  ?Procedure Laterality Date  ? COLONOSCOPY WITH PROPOFOL N/A 06/26/2019  ? Procedure: COLONOSCOPY WITH PROPOFOL;  Surgeon: [name], MD;  Location[facility] ENDOSCOPY;  Service: Gastroenterology;  Laterality: N/A;  ? FLEXIBLE SIGMOIDOSCOPY N/A 05/07/2021  ? Procedure:  FLEXIBLE SIGMOIDOSCOPY;  Surgeon: [name],  MD;  Location: [facility] ENDOSCOPY;  Service: Gastroenterology;  Laterality: N/A;  ? PORTA CATH INSERTION N/A 06/25/2019  ? Procedure: PORTA CATH INSERTION;  Surgeon: Algernon Huxley, MD;  Location: [facility] CV LAB;  Service: Cardiovascular;  Laterality: N/A;  ? PORTA CATH INSERTION Left 08/11/2020  ? Procedure: PORTA CATH INSERTION;  Surgeon: Algernon Huxley, MD;  Location: [place] CV LAB;  Service: Cardiovascular;  Laterality: Left;  ? PORTA CATH REMOVAL Right 08/11/2020  ? Procedure: PORTA CATH REMOVAL;  Surgeon: Algernon Huxley, MD;  Location: [place] CV LAB;  Service: Cardiovascular;  Laterality: Right;  ? ? ?Social History  ? ?Socioeconomic History  ? Marital status: Single  ?  Spouse name: Not on file  ? Number of children: Not on file  ? Years of education: Not on file  ? Highest education level: Not on file  ?Occupational History  ? Not on file  ?Tobacco Use  ? Smoking status: Every Day  ?  Packs/day: 0.50  ?  Years: 40.00  ?  Pack years: 20.00  ?  Types: Cigarettes  ? Smokeless tobacco: Never  ? Tobacco comments:  ?  smoking 40 years  ?Vaping Use  ? Vaping Use: Never used  ?Substance and Sexual Activity  ? Alcohol use: No  ? Drug use: No  ? Sexual activity: Not Currently  ?Other Topics Concern  ? Not on file  ?Social History Narrative  ? Not on file  ? ?Social Determinants of Health  ? ?Financial Resource Strain: Not on file  ?Food Insecurity: Not on file  ?Transportation Needs: Not on file  ?Physical Activity: Not on file  ?Stress: Not on file  ?Social Connections: Not on file  ?Intimate Partner Violence: Not on file  ? ? ?Family History  ?Problem Relation Age of Onset  ? Brain cancer Father   ? ? ? ?Current Outpatient Medications:  ?  allopurinol (ZYLOPRIM) 100 MG tablet, Take 100 mg by mouth daily., Disp: , Rfl:  ?  aspirin EC 81 MG tablet, Take 81 mg by mouth daily., Disp: , Rfl:  ?  azelastine (ASTELIN) 0.1 % nasal spray, Place 1 spray into both nostrils daily as needed., Disp: , Rfl:  ?   butalbital-acetaminophen-caffeine (FIORICET) 50-325-40 MG tablet, Take 1 tablet by mouth every 4 (four) hours as needed., Disp: , Rfl:  ?  capecitabine (XELODA) 500 MG tablet, Take 1 tablet (500 mg total) by mouth 2 (two) times daily after a meal. Take for 14 days, then hold for 7 days. Repeat every 21 days., Disp: 28 tablet, Rfl: 0 ?  cetirizine (ZYRTEC) 10 MG tablet, Take 1 tablet (10 mg total) by mouth daily., Disp: 30 tablet, Rfl: 0 ?  chlorthalidone (HYGROTON) 25 MG tablet, Take 1 tablet by mouth daily., Disp: , Rfl:  ?  DEXILANT 60 MG capsule, Take 60 mg by mouth daily., Disp: , Rfl:  ?  DULoxetine (CYMBALTA) 30 MG capsule, Take 1 capsule (30 mg total) by mouth 2 (two) times daily., Disp: 60 capsule, Rfl: 1 ?  fluticasone (FLONASE) 50 MCG/ACT nasal spray, Place 2 sprays into both nostrils daily as needed., Disp: , Rfl:  ?  fluticasone (VERAMYST) 27.5 MCG/SPRAY nasal spray, Place 2 sprays into the nose daily as needed., Disp: , Rfl:  ?  lisinopril (ZESTRIL) 5 MG tablet, Take 5 mg by mouth daily., Disp: , Rfl:  ?  loperamide (IMODIUM A-D) 2 MG tablet, Take 1 tablet (2  mg total) by mouth 4 (four) times daily as needed. Take 2 at diarrhea onset , then 1 every 2hr until 12hrs with no BM. May take 2 every 4hrs at night. If diarrhea recurs repeat., Disp: 100 tablet, Rfl: 1 ?  montelukast (SINGULAIR) 10 MG tablet, Take 10 mg by mouth at bedtime., Disp: , Rfl:  ?  NEXLETOL 180 MG TABS, Take 1 tablet by mouth daily., Disp: , Rfl:  ?  pregabalin (LYRICA) 75 MG capsule, Take 1 capsule (75 mg total) by mouth 2 (two) times daily., Disp: 60 capsule, Rfl: 2 ?  simvastatin (ZOCOR) 20 MG tablet, Take 20 mg by mouth daily. , Disp: , Rfl:  ?  SYMBICORT 80-4.5 MCG/ACT inhaler, Inhale 2 puffs into the lungs daily., Disp: , Rfl:  ?  tamsulosin (FLOMAX) 0.4 MG CAPS capsule, Take 0.4 mg by mouth. , Disp: , Rfl:  ?  magic mouthwash (multi-ingredient) oral suspension, Swish and spit 5-10 mls by mouth 4 times a day as needed (Patient not  taking: Reported on 04/05/2022), Disp: 480 mL, Rfl: 3 ?  oxyCODONE (OXY IR/ROXICODONE) 5 MG immediate release tablet, Take 1 tablet (5 mg total) by mouth every 6 (six) hours as needed for severe pain. (Patient not ta

## 2022-04-26 NOTE — Patient Instructions (Signed)
[facility]CER [facility] AT [place]  Discharge Instructions: ?Thank you for choosing [facility] to provide your oncology and hematology care.  ?If you have a lab appointment with the [facility], please go directly to the [facility] and check in at the registration area. ? ?Wear comfortable clothing and clothing appropriate for easy access to any Portacath or PICC line.  ? ?We strive to give you quality time with your provider. You may need to reschedule your appointment if you arrive late (15 or more minutes).  Arriving late affects you and other patients whose appointments are after yours.  Also, if you miss three or more appointments without notifying the office, you may be dismissed from the clinic at the provider?s discretion.    ?  ?For prescription refill requests, have your pharmacy contact our office and allow 72 hours for refills to be completed.   ? ?Today you received the following chemotherapy and/or immunotherapy agents VECTIBEX, IRINOTECAN    ?  ?To help prevent nausea and vomiting after your treatment, we encourage you to take your nausea medication as directed. ? ?BELOW ARE SYMPTOMS THAT SHOULD BE REPORTED IMMEDIATELY: ?*FEVER GREATER THAN 100.4 F (38 ?C) OR HIGHER ?*CHILLS OR SWEATING ?*NAUSEA AND VOMITING THAT IS NOT CONTROLLED WITH YOUR NAUSEA MEDICATION ?*UNUSUAL SHORTNESS OF BREATH ?*UNUSUAL BRUISING OR BLEEDING ?*URINARY PROBLEMS (pain or burning when urinating, or frequent urination) ?*BOWEL PROBLEMS (unusual diarrhea, constipation, pain near the anus) ?TENDERNESS IN MOUTH AND THROAT WITH OR WITHOUT PRESENCE OF ULCERS (sore throat, sores in mouth, or a toothache) ?UNUSUAL RASH, SWELLING OR PAIN  ?UNUSUAL VAGINAL DISCHARGE OR ITCHING  ? ?Items with * indicate a potential emergency and should be followed up as soon as possible or go to the Emergency Department if any problems should occur. ? ?Please show the CHEMOTHERAPY ALERT CARD or IMMUNOTHERAPY ALERT CARD at  check-in to the Emergency Department and triage nurse. ? ?Should you have questions after your visit or need to cancel or reschedule your appointment, please contact [facility] CANCER [place] AT [place]  [phone] and follow the prompts.  Office hours are 8:00 a.m. to 4:30 p.m. Monday - Friday. Please note that voicemails left after 4:00 p.m. may not be returned until the following business day.  We are closed weekends and major holidays. You have access to a nurse at all times for urgent questions. Please call the main number to the clinic [phone] and follow the prompts. ? ?For any non-urgent questions, you may also contact your provider using MyChart. We now offer e-Visits for anyone 73 and older to request care online for non-urgent symptoms. For details visit [email or web address]. ?  ?Also download the MyChart app! Go to the app store, search "MyChart", open the app, select [facility], and log in with your MyChart username and password. ? ?Due to Covid, a mask is required upon entering the hospital/clinic. If you do not have a mask, one will be given to you upon arrival. For doctor visits, patients may have 1 support person aged 73 or older with them. For treatment visits, patients cannot have anyone with them due to current Covid guidelines and our immunocompromised population.  ? ?Panitumumab Solution for Injection ?What is this medication? ?PANITUMUMAB (pan i TOOM ue mab) is a monoclonal antibody. It is used to treat colorectal cancer. ?This medicine may be used for other purposes; ask your health care provider or pharmacist if you have questions. ?COMMON BRAND NAME(S): Vectibix ?What should I tell my care  team before I take this medication? ?They need to know if you have any of these conditions: ?eye disease, vision problems ?low levels of calcium, magnesium, or potassium in the blood ?lung or breathing disease, like asthma ?skin conditions or sensitivity ?an unusual or allergic reaction  to panitumumab, other medicines, foods, dyes, or preservatives ?pregnant or trying to get pregnant ?breast-feeding ?How should I use this medication? ?This drug is given as an infusion into a vein. It is administered in a hospital or clinic by a specially trained health care professional. ?Talk to your pediatrician regarding the use of this medicine in children. Special care may be needed. ?Overdosage: If you think you have taken too much of this medicine contact a poison control center or emergency room at once. ?NOTE: This medicine is only for you. Do not share this medicine with others. ?What if I miss a dose? ?It is important not to miss your dose. Call your doctor or health care professional if you are unable to keep an appointment. ?What may interact with this medication? ?Do not take this medicine with any of the following medications: ?bevacizumab ?This list may not describe all possible interactions. Give your health care provider a list of all the medicines, herbs, non-prescription drugs, or dietary supplements you use. Also tell them if you smoke, drink alcohol, or use illegal drugs. Some items may interact with your medicine. ?What should I watch for while using this medication? ?Visit your doctor for checks on your progress. This drug may make you feel generally unwell. This is not uncommon, as chemotherapy can affect healthy cells as well as cancer cells. Report any side effects. Continue your course of treatment even though you feel ill unless your doctor tells you to stop. ?This medicine can make you more sensitive to the sun. Keep out of the sun while receiving this medicine and for 2 months after the last dose. If you cannot avoid being in the sun, wear protective clothing and use sunscreen. Do not use sun lamps or tanning beds/booths. ?In some cases, you may be given additional medicines to help with side effects. Follow all directions for their use. ?Call your doctor or health care professional  for advice if you get a fever, chills or sore throat, or other symptoms of a cold or flu. Do not treat yourself. This drug decreases your body's ability to fight infections. Try to avoid being around people who are sick. ?Avoid taking products that contain aspirin, acetaminophen, ibuprofen, naproxen, or ketoprofen unless instructed by your doctor. These medicines may hide a fever. ?Do not become pregnant while taking this medicine and for 2 months after the last dose. Women should inform their doctor if they wish to become pregnant or think they might be pregnant. There is a potential for serious side effects to an unborn child. Talk to your health care professional or pharmacist for more information. Do not breast-feed an infant while taking this medicine or for 2 months after the last dose. ?What side effects may I notice from receiving this medication? ?Side effects that you should report to your doctor or health care professional as soon as possible: ?allergic reactions like skin rash, itching or hives, swelling of the face, lips, or tongue ?breathing problems ?changes in vision ?eye pain ?fast, irregular heartbeat ?fever, chills ?mouth sores ?red spots on the skin ?redness, blistering, peeling or loosening of the skin, including inside the mouth ?signs and symptoms of kidney injury like trouble passing urine or change in the  amount of urine ?signs and symptoms of low blood pressure like dizziness; feeling faint or lightheaded, falls; unusually weak or tired ?signs of low calcium like fast heartbeat, muscle cramps or muscle pain; pain, tingling, numbness in the hands or feet; seizures ?signs and symptoms of low magnesium like muscle cramps, pain, or weakness; tremors; seizures; or fast, irregular heartbeat ?signs and symptoms of low potassium like muscle cramps or muscle pain; chest pain; dizziness; feeling faint or lightheaded, falls; palpitations; breathing problems; or fast, irregular heartbeat ?swelling of  the ankles, feet, hands ?Side effects that usually do not require medical attention (report to your doctor or health care professional if they continue or are bothersome): ?changes in skin like acne, crac

## 2022-04-27 LAB — CEA: CEA: 4.8 ng/mL — ABNORMAL HIGH (ref 0.0–4.7)

## 2022-05-05 DIAGNOSIS — I2510 Atherosclerotic heart disease of native coronary artery without angina pectoris: Secondary | ICD-10-CM | POA: Diagnosis not present

## 2022-05-06 ENCOUNTER — Other Ambulatory Visit (HOSPITAL_COMMUNITY): Payer: Self-pay

## 2022-05-06 ENCOUNTER — Other Ambulatory Visit: Payer: Self-pay | Admitting: Oncology

## 2022-05-06 DIAGNOSIS — C189 Malignant neoplasm of colon, unspecified: Secondary | ICD-10-CM

## 2022-05-06 MED ORDER — CAPECITABINE 500 MG PO TABS
500.0000 mg | ORAL_TABLET | Freq: Two times a day (BID) | ORAL | 0 refills | Status: DC
  Filled 2022-05-06: qty 28, 21d supply, fill #0

## 2022-05-07 DIAGNOSIS — E782 Mixed hyperlipidemia: Secondary | ICD-10-CM | POA: Diagnosis not present

## 2022-05-07 DIAGNOSIS — F17210 Nicotine dependence, cigarettes, uncomplicated: Secondary | ICD-10-CM | POA: Diagnosis not present

## 2022-05-07 DIAGNOSIS — I10 Essential (primary) hypertension: Secondary | ICD-10-CM | POA: Diagnosis not present

## 2022-05-07 DIAGNOSIS — I340 Nonrheumatic mitral (valve) insufficiency: Secondary | ICD-10-CM | POA: Diagnosis not present

## 2022-05-07 DIAGNOSIS — R0602 Shortness of breath: Secondary | ICD-10-CM | POA: Diagnosis not present

## 2022-05-12 ENCOUNTER — Other Ambulatory Visit (HOSPITAL_COMMUNITY): Payer: Self-pay

## 2022-05-21 MED FILL — Dexamethasone Sodium Phosphate Inj 100 MG/10ML: INTRAMUSCULAR | Qty: 1 | Status: AC

## 2022-05-24 ENCOUNTER — Encounter: Payer: Self-pay | Admitting: Oncology

## 2022-05-24 ENCOUNTER — Inpatient Hospital Stay: Payer: Medicare HMO

## 2022-05-24 ENCOUNTER — Inpatient Hospital Stay (HOSPITAL_BASED_OUTPATIENT_CLINIC_OR_DEPARTMENT_OTHER): Payer: Medicare HMO | Admitting: Oncology

## 2022-05-24 ENCOUNTER — Inpatient Hospital Stay: Payer: Medicare HMO | Attending: Oncology

## 2022-05-24 VITALS — BP 109/65 | HR 72 | Temp 97.8°F | Resp 16 | Ht 67.0 in | Wt 158.8 lb

## 2022-05-24 DIAGNOSIS — C778 Secondary and unspecified malignant neoplasm of lymph nodes of multiple regions: Secondary | ICD-10-CM | POA: Insufficient documentation

## 2022-05-24 DIAGNOSIS — C187 Malignant neoplasm of sigmoid colon: Secondary | ICD-10-CM | POA: Insufficient documentation

## 2022-05-24 DIAGNOSIS — Z5111 Encounter for antineoplastic chemotherapy: Secondary | ICD-10-CM | POA: Diagnosis not present

## 2022-05-24 DIAGNOSIS — C189 Malignant neoplasm of colon, unspecified: Secondary | ICD-10-CM

## 2022-05-24 DIAGNOSIS — C772 Secondary and unspecified malignant neoplasm of intra-abdominal lymph nodes: Secondary | ICD-10-CM | POA: Diagnosis not present

## 2022-05-24 DIAGNOSIS — Z5112 Encounter for antineoplastic immunotherapy: Secondary | ICD-10-CM | POA: Diagnosis not present

## 2022-05-24 DIAGNOSIS — M545 Low back pain, unspecified: Secondary | ICD-10-CM

## 2022-05-24 LAB — COMPREHENSIVE METABOLIC PANEL
ALT: 17 U/L (ref 0–44)
AST: 27 U/L (ref 15–41)
Albumin: 3.7 g/dL (ref 3.5–5.0)
Alkaline Phosphatase: 145 U/L — ABNORMAL HIGH (ref 38–126)
Anion gap: 9 (ref 5–15)
BUN: 30 mg/dL — ABNORMAL HIGH (ref 8–23)
CO2: 23 mmol/L (ref 22–32)
Calcium: 9.1 mg/dL (ref 8.9–10.3)
Chloride: 104 mmol/L (ref 98–111)
Creatinine, Ser: 1.11 mg/dL (ref 0.61–1.24)
GFR, Estimated: >60 mL/min (ref >60)
Glucose, Bld: 107 mg/dL — ABNORMAL HIGH (ref 70–99)
Potassium: 4 mmol/L (ref 3.5–5.1)
Sodium: 136 mmol/L (ref 135–145)
Total Bilirubin: 1.1 mg/dL (ref 0.3–1.2)
Total Protein: 7.5 g/dL (ref 6.5–8.1)

## 2022-05-24 LAB — CBC WITH DIFFERENTIAL/PLATELET
Abs Immature Granulocytes: 0.11 10*3/uL — ABNORMAL HIGH (ref 0.00–0.07)
Basophils Absolute: 0.1 10*3/uL (ref 0.0–0.1)
Basophils Relative: 1 %
Eosinophils Absolute: 0.2 10*3/uL (ref 0.0–0.5)
Eosinophils Relative: 3 %
HCT: 39 % (ref 39.0–52.0)
Hemoglobin: 13.5 g/dL (ref 13.0–17.0)
Immature Granulocytes: 2 %
Lymphocytes Relative: 27 %
Lymphs Abs: 1.9 10*3/uL (ref 0.7–4.0)
MCH: 33 pg (ref 26.0–34.0)
MCHC: 34.6 g/dL (ref 30.0–36.0)
MCV: 95.4 fL (ref 80.0–100.0)
Monocytes Absolute: 0.6 10*3/uL (ref 0.1–1.0)
Monocytes Relative: 9 %
Neutro Abs: 4.3 10*3/uL (ref 1.7–7.7)
Neutrophils Relative %: 58 %
Platelets: 148 10*3/uL — ABNORMAL LOW (ref 150–400)
RBC: 4.09 MIL/uL — ABNORMAL LOW (ref 4.22–5.81)
RDW: 14.6 % (ref 11.5–15.5)
WBC: 7.3 10*3/uL (ref 4.0–10.5)
nRBC: 0 % (ref 0.0–0.2)

## 2022-05-24 LAB — MAGNESIUM: Magnesium: 1.9 mg/dL (ref 1.7–2.4)

## 2022-05-24 MED ORDER — SODIUM CHLORIDE 0.9 % IV SOLN
125.0000 mg/m2 | Freq: Once | INTRAVENOUS | Status: AC
  Administered 2022-05-24: 220 mg via INTRAVENOUS
  Filled 2022-05-24: qty 5

## 2022-05-24 MED ORDER — CYCLOBENZAPRINE HCL 10 MG PO TABS: 10.0000 mg | ORAL_TABLET | Freq: Three times a day (TID) | ORAL | 0 refills | Status: DC | PRN

## 2022-05-24 MED ORDER — SODIUM CHLORIDE 0.9 % IV SOLN
6.0000 mg/kg | Freq: Once | INTRAVENOUS | Status: AC
  Administered 2022-05-24: 400 mg via INTRAVENOUS
  Filled 2022-05-24: qty 20

## 2022-05-24 MED ORDER — KETOROLAC TROMETHAMINE 15 MG/ML IJ SOLN
15.0000 mg | Freq: Once | INTRAMUSCULAR | Status: AC
  Administered 2022-05-24: 15 mg via INTRAVENOUS
  Filled 2022-05-24: qty 1

## 2022-05-24 MED ORDER — LIDOCAINE-PRILOCAINE 2.5-2.5 % EX CREA: 1.0000 "application " | TOPICAL_CREAM | CUTANEOUS | 3 refills | Status: DC | PRN

## 2022-05-24 MED ORDER — SODIUM CHLORIDE 0.9 % IV SOLN
10.0000 mg | Freq: Once | INTRAVENOUS | Status: AC
  Administered 2022-05-24: 10 mg via INTRAVENOUS
  Filled 2022-05-24: qty 10

## 2022-05-24 MED ORDER — SODIUM CHLORIDE 0.9 % IV SOLN
Freq: Once | INTRAVENOUS | Status: AC
  Filled 2022-05-24: qty 250

## 2022-05-24 MED ORDER — ATROPINE SULFATE 1 MG/ML IV SOLN
0.5000 mg | Freq: Once | INTRAVENOUS | Status: AC | PRN
  Administered 2022-05-24: 0.5 mg via INTRAVENOUS
  Filled 2022-05-24: qty 1

## 2022-05-24 MED ORDER — PALONOSETRON HCL INJECTION 0.25 MG/5ML
0.2500 mg | Freq: Once | INTRAVENOUS | Status: AC
  Administered 2022-05-24: 0.25 mg via INTRAVENOUS
  Filled 2022-05-24: qty 5

## 2022-05-24 MED ORDER — KETOROLAC TROMETHAMINE 15 MG/ML IJ SOLN: 15.0000 mg | Freq: Once | INTRAMUSCULAR | Status: DC

## 2022-05-24 MED ORDER — HEPARIN SOD (PORK) LOCK FLUSH 100 UNIT/ML IV SOLN
500.0000 [IU] | Freq: Once | INTRAVENOUS | Status: AC | PRN
  Administered 2022-05-24: 500 [IU]
  Filled 2022-05-24: qty 5

## 2022-05-24 NOTE — Progress Notes (Signed)
Pt has back pain but hurts when he sits. He did try aleve and it did not work.

## 2022-05-24 NOTE — Progress Notes (Signed)
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 He currently smokes 0.5ppd. he presented to the ER with symptoms of left sided chest pain and left arm pain. Troponin was negative ekg was unremarkable. Ct chest showed no PE. He was incidentally noted to have mediastinal and retrocrural adenopathy and a 2.1X2.3X4.4 cm retroaortic soft tissue lesion in the posterior left chest. 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  Low-grade activity in the left lateral fifth and sixth ribs associated with nondisplaced fractures likely reflecting healing response problem malignancy.   Patient underwent CT-guided biopsy of the retroperitoneal lymph node pathology showed metastatic adenocarcinoma compatible with colorectal origin.  CK7 negative.  CK20 positive.  CDX 2+.  TTF-1 negative.  PSA negative.  This pattern of immunoreactivity supports the above diagnosis. Patient underwent colonoscopy which showed sigmoid mass that was consistent with adenocarcinoma.  RAS panel testing showed that he was wild-type for both K-ras and BRAF   FOLFOX and bevacizumab chemotherapy started in July 2020.  Subsequently oxaliplatin has been on hold given neuropathy.   Recent scans have shown slow increase in the size of intra-abdominal adenopathy but patient continues to have low-volume disease.plan is to switch him to second line Xeloda irinotecan panitumumab  regimen    Interval history-patient currently reports some ongoing low back pain for the last 2 weeks.  It does not affect his quality of life significantly.  He is still able to perform his ADLs and IADLs.  Denies other complaints at this time  ECOG PS- 1 Pain scale- 3 Opioid associated constipation- no  Review of systems- Review of Systems  Constitutional:  Positive for malaise/fatigue. Negative for chills, fever and weight loss.  HENT:  Negative for congestion, ear discharge and nosebleeds.   Eyes:  Negative for blurred vision.  Respiratory:  Negative for cough, hemoptysis, sputum production, shortness of breath and wheezing.   Cardiovascular:  Negative for chest pain, palpitations, orthopnea and claudication.  Gastrointestinal:  Negative for abdominal pain, blood in stool, constipation, diarrhea, heartburn, melena, nausea and vomiting.  Genitourinary:  Negative for dysuria, flank pain, frequency, hematuria and urgency.  Musculoskeletal:  Negative for back pain, joint pain and myalgias.  Skin:  Negative for rash.  Neurological:  Negative for dizziness, tingling, focal weakness, seizures, weakness and headaches.  Endo/Heme/Allergies:  Does not bruise/bleed easily.  Psychiatric/Behavioral:  Negative for depression and suicidal ideas. The patient does not have insomnia.       No Known Allergies   Past Medical History:  Diagnosis Date   Colon cancer ([place])    COPD (chronic obstructive pulmonary disease) ([place])    Hyperlipidemia      Past Surgical History:  Procedure Laterality Date   COLONOSCOPY WITH PROPOFOL N/A 06/26/2019   Procedure: COLONOSCOPY WITH PROPOFOL;  Surgeon: [name], MD;  Location:  [facility] ENDOSCOPY;  Service: Gastroenterology;  Laterality: N/A;   FLEXIBLE SIGMOIDOSCOPY  N/A 05/07/2021   Procedure: FLEXIBLE SIGMOIDOSCOPY;  Surgeon: [name], MD;  Location: [facility] ENDOSCOPY;  Service: Gastroenterology;  Laterality: N/A;   PORTA CATH INSERTION N/A 06/25/2019   Procedure: PORTA CATH INSERTION;  Surgeon: Algernon Huxley, MD;  Location: [place] CV LAB;  Service: Cardiovascular;  Laterality: N/A;   PORTA CATH INSERTION Left 08/11/2020   Procedure: PORTA CATH INSERTION;  Surgeon: Algernon Huxley, MD;  Location: [place] CV LAB;  Service: Cardiovascular;  Laterality: Left;   PORTA CATH REMOVAL Right 08/11/2020   Procedure: PORTA CATH REMOVAL;  Surgeon: Algernon Huxley, MD;  Location: [place] CV LAB;  Service: Cardiovascular;  Laterality: Right;    Social History   Socioeconomic History   Marital status: Single    Spouse name: Not on file   Number of children: Not on file   Years of education: Not on file   Highest education level: Not on file  Occupational History   Not on file  Tobacco Use   Smoking status: Every Day    Packs/day: 0.50    Years: 40.00    Total pack years: 20.00    Types: Cigarettes   Smokeless tobacco: Never   Tobacco comments:    smoking 40 years  Vaping Use   Vaping Use: Never used  Substance and Sexual Activity   Alcohol use: No   Drug use: No   Sexual activity: Not Currently  Other Topics Concern   Not on file  Social History Narrative   Not on file   Social Determinants of Health   Financial Resource Strain: Low Risk  (06/12/2019)   Overall Financial Resource Strain (CARDIA)    Difficulty of Paying Living Expenses: Not hard at all  Food Insecurity: No Food Insecurity (06/12/2019)   Hunger Vital Sign    Worried About Running Out of Food in the Last Year: Never true    Ran Out of Food in the Last Year: Never true  Transportation Needs: No Transportation Needs (06/12/2019)   PRAPARE - Hydrologist (Medical): No    Lack of Transportation (Non-Medical): No  Physical Activity: Not on file   Stress: No Stress Concern Present (06/12/2019)   [place]    Feeling of Stress : Not at all  Social Connections: Unknown (06/12/2019)   Social Connection and Isolation Panel [NHANES]    Frequency of Communication with Friends and Family: More than three times a week    Frequency of Social Gatherings with Friends and Family: Not on file    Attends Religious Services: Not on file    Active Member of [place] or Organizations: Not on file    Attends Archivist Meetings: Not on file    Marital Status: Not on file  Intimate Partner Violence: Not At Risk (06/12/2019)   Humiliation, Afraid, Rape, and Kick questionnaire    Fear of Current or Ex-Partner: No    Emotionally Abused: No    Physically Abused: No    Sexually Abused: No    Family History  Problem Relation Age of Onset   Brain cancer Father      Current Outpatient Medications:    allopurinol (ZYLOPRIM) 100 MG tablet, Take 100 mg by mouth daily., Disp: , Rfl:    aspirin EC 81 MG tablet, Take 81 mg by mouth daily., Disp: , Rfl:  azelastine (ASTELIN) 0.1 % nasal spray, Place 1 spray into both nostrils daily as needed., Disp: , Rfl:    butalbital-acetaminophen-caffeine (FIORICET) 50-325-40 MG tablet, Take 1 tablet by mouth every 4 (four) hours as needed., Disp: , Rfl:    capecitabine (XELODA) 500 MG tablet, Take 1 tablet (500 mg total) by mouth 2 (two) times daily after a meal. Take for 14 days, then hold for 7 days. Repeat every 21 days., Disp: 28 tablet, Rfl: 0   cetirizine (ZYRTEC) 10 MG tablet, Take 1 tablet (10 mg total) by mouth daily., Disp: 30 tablet, Rfl: 0   chlorthalidone (HYGROTON) 25 MG tablet, Take 1 tablet by mouth daily., Disp: , Rfl:    cyclobenzaprine (FLEXERIL) 10 MG tablet, Take 1 tablet (10 mg total) by mouth 3 (three) times daily as needed for muscle spasms., Disp: 42 tablet, Rfl: 0   DULoxetine (CYMBALTA) 30 MG capsule, Take 1 capsule (30  mg total) by mouth 2 (two) times daily., Disp: 60 capsule, Rfl: 1   fluticasone (FLONASE) 50 MCG/ACT nasal spray, Place 2 sprays into both nostrils daily as needed., Disp: , Rfl:    fluticasone (VERAMYST) 27.5 MCG/SPRAY nasal spray, Place 2 sprays into the nose daily as needed., Disp: , Rfl:    lidocaine-prilocaine (EMLA) cream, Apply 1 application  topically as needed (pt will put 1 inch of cream over port 1 hourfor each treatment, cover plastic over the cream protect clothes)., Disp: 30 g, Rfl: 3   lisinopril (ZESTRIL) 5 MG tablet, Take 5 mg by mouth daily., Disp: , Rfl:    montelukast (SINGULAIR) 10 MG tablet, Take 10 mg by mouth at bedtime., Disp: , Rfl:    NEXLETOL 180 MG TABS, Take 1 tablet by mouth daily., Disp: , Rfl:    pregabalin (LYRICA) 75 MG capsule, Take 1 capsule (75 mg total) by mouth 2 (two) times daily., Disp: 60 capsule, Rfl: 2   simvastatin (ZOCOR) 20 MG tablet, Take 20 mg by mouth daily. , Disp: , Rfl:    SYMBICORT 80-4.5 MCG/ACT inhaler, Inhale 2 puffs into the lungs daily., Disp: , Rfl:    tamsulosin (FLOMAX) 0.4 MG CAPS capsule, Take 0.4 mg by mouth. , Disp: , Rfl:    DEXILANT 60 MG capsule, Take 60 mg by mouth daily., Disp: , Rfl:    loperamide (IMODIUM A-D) 2 MG tablet, Take 1 tablet (2 mg total) by mouth 4 (four) times daily as needed. Take 2 at diarrhea onset , then 1 every 2hr until 12hrs with no BM. May take 2 every 4hrs at night. If diarrhea recurs repeat. (Patient not taking: Reported on 05/24/2022), Disp: 100 tablet, Rfl: 1   magic mouthwash (multi-ingredient) oral suspension, Swish and spit 5-10 mls by mouth 4 times a day as needed (Patient not taking: Reported on 04/05/2022), Disp: 480 mL, Rfl: 3   oxyCODONE (OXY IR/ROXICODONE) 5 MG immediate release tablet, Take 1 tablet (5 mg total) by mouth every 6 (six) hours as needed for severe pain. (Patient not taking: Reported on 04/05/2022), Disp: 30 tablet, Rfl: 0  Current Facility-Administered Medications:    ketorolac  (TORADOL) 15 MG/ML injection 15 mg, 15 mg, Intravenous, Once, Sindy Guadeloupe, MD  Facility-Administered Medications Ordered in Other Visits:    heparin lock flush 100 UNIT/ML injection, , , ,   Physical exam:  Vitals:   05/24/22 0836  BP: 109/65  Pulse: 72  Resp: 16  Temp: 97.8 F (36.6 C)  TempSrc: Oral  Weight: 158 lb 12.8 oz (  72 kg)  Height: '5\' 7"'  (1.702 m)   Physical Exam Constitutional:      General: He is not in acute distress. Cardiovascular:     Rate and Rhythm: Normal rate and regular rhythm.     Heart sounds: Normal heart sounds.  Pulmonary:     Effort: Pulmonary effort is normal.     Breath sounds: Normal breath sounds.  Abdominal:     General: Bowel sounds are normal.     Palpations: Abdomen is soft.  Skin:    General: Skin is warm and dry.  Neurological:     Mental Status: He is alert and oriented to person, place, and time.         Latest Ref Rng & Units 05/24/2022    8:07 AM  CMP  Glucose 70 - 99 mg/dL 107   BUN 8 - 23 mg/dL 30   Creatinine 0.61 - 1.24 mg/dL 1.11   Sodium 135 - 145 mmol/L 136   Potassium 3.5 - 5.1 mmol/L 4.0   Chloride 98 - 111 mmol/L 104   CO2 22 - 32 mmol/L 23   Calcium 8.9 - 10.3 mg/dL 9.1   Total Protein 6.5 - 8.1 g/dL 7.5   Total Bilirubin 0.3 - 1.2 mg/dL 1.1   Alkaline Phos 38 - 126 U/L 145   AST 15 - 41 U/L 27   ALT 0 - 44 U/L 17       Latest Ref Rng & Units 05/24/2022    8:07 AM  CBC  WBC 4.0 - 10.5 K/uL 7.3   Hemoglobin 13.0 - 17.0 g/dL 13.5   Hematocrit 39.0 - 52.0 % 39.0   Platelets 150 - 400 K/uL 148      Assessment and plan- Patient is a 71 y.o. male  with metastatic colon cancer and intra-abdominal metastases stage IV.  He is here for on treatment assessment prior to cycle 14 of palliative panitumumab irinotecan chemotherapy  Counts okay to proceed with cycle 13 of panitumumab irinotecan chemotherapy today.  He is also on Xeloda 500 mg twice daily 2 weeks on and 1 week off.  CEA stable around 4.8.  I will  see him back in 3 weeks for cycle 14 and plan to repeat scans after that.  Low back pain: Likely musculoskeletal.  I have given him a prescription for as needed Robaxin.     Visit Diagnosis 1. 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 ([place])   2. Encounter for antineoplastic chemotherapy      Dr. Randa Evens, MD, MPH [facility] at [facility] [phone] 05/24/2022 4:01 PM

## 2022-05-24 NOTE — Patient Instructions (Signed)
[facility]ER [facility] AT [place]  Discharge Instructions: Thank you for choosing [facility] to provide your oncology and hematology care.  If you have a lab appointment with the [facility], please go directly to the [facility] and check in at the registration area.  Wear comfortable clothing and clothing appropriate for easy access to any Portacath or PICC line.   We strive to give you quality time with your provider. You may need to reschedule your appointment if you arrive late (15 or more minutes).  Arriving late affects you and other patients whose appointments are after yours.  Also, if you miss three or more appointments without notifying the office, you may be dismissed from the clinic at the provider's discretion.      For prescription refill requests, have your pharmacy contact our office and allow 72 hours for refills to be completed.    Today you received the following chemotherapy and/or immunotherapy agents Vectibex & Camptosar      To help prevent nausea and vomiting after your treatment, we encourage you to take your nausea medication as directed.  BELOW ARE SYMPTOMS THAT SHOULD BE REPORTED IMMEDIATELY: *FEVER GREATER THAN 100.4 F (38 C) OR HIGHER *CHILLS OR SWEATING *NAUSEA AND VOMITING THAT IS NOT CONTROLLED WITH YOUR NAUSEA MEDICATION *UNUSUAL SHORTNESS OF BREATH *UNUSUAL BRUISING OR BLEEDING *URINARY PROBLEMS (pain or burning when urinating, or frequent urination) *BOWEL PROBLEMS (unusual diarrhea, constipation, pain near the anus) TENDERNESS IN MOUTH AND THROAT WITH OR WITHOUT PRESENCE OF ULCERS (sore throat, sores in mouth, or a toothache) UNUSUAL RASH, SWELLING OR PAIN  UNUSUAL VAGINAL DISCHARGE OR ITCHING   Items with * indicate a potential emergency and should be followed up as soon as possible or go to the Emergency Department if any problems should occur.  Please show the CHEMOTHERAPY ALERT CARD or IMMUNOTHERAPY ALERT CARD at  check-in to the Emergency Department and triage nurse.  Should you have questions after your visit or need to cancel or reschedule your appointment, please contact [facility] [place] [facility] [place]  [phone] and follow the prompts.  Office hours are 8:00 a.m. to 4:30 p.m. Monday - Friday. Please note that voicemails left after 4:00 p.m. may not be returned until the following business day.  We are closed weekends and major holidays. You have access to a nurse at all times for urgent questions. Please call the main number to the clinic [phone] and follow the prompts.  For any non-urgent questions, you may also contact your provider using MyChart. We now offer e-Visits for anyone 4 and older to request care online for non-urgent symptoms. For details visit [email or web address].   Also download the MyChart app! Go to the app store, search "MyChart", open the app, select [facility], and log in with your MyChart username and password.  Due to Covid, a mask is required upon entering the hospital/clinic. If you do not have a mask, one will be given to you upon arrival. For doctor visits, patients may have 1 support person aged 61 or older with them. For treatment visits, patients cannot have anyone with them due to current Covid guidelines and our immunocompromised population.

## 2022-05-25 ENCOUNTER — Other Ambulatory Visit (HOSPITAL_COMMUNITY): Payer: Self-pay

## 2022-05-25 ENCOUNTER — Telehealth: Payer: Self-pay | Admitting: *Deleted

## 2022-05-25 LAB — CEA: CEA: 5.7 ng/mL — ABNORMAL HIGH (ref 0.0–4.7)

## 2022-05-25 NOTE — Telephone Encounter (Signed)
Dr. [name] wanted to draw tubes of blood for foundation CDX. The infusion staff drew the tubes and I labeled them and put the paper work in the bag and sent it to shipping at main hospital to fedex the specimen . I took it to main hospital 6/12 at 2:45 pm

## 2022-05-26 ENCOUNTER — Other Ambulatory Visit (HOSPITAL_COMMUNITY): Payer: Self-pay

## 2022-05-26 ENCOUNTER — Other Ambulatory Visit: Payer: Self-pay | Admitting: Oncology

## 2022-05-26 DIAGNOSIS — C189 Malignant neoplasm of colon, unspecified: Secondary | ICD-10-CM

## 2022-05-26 MED ORDER — CAPECITABINE 500 MG PO TABS
500.0000 mg | ORAL_TABLET | Freq: Two times a day (BID) | ORAL | 0 refills | Status: DC
  Filled 2022-05-26: qty 28, 21d supply, fill #0

## 2022-05-26 NOTE — Telephone Encounter (Signed)
CBC with Differential Order: [id number] Status: Final result    Visible to patient: Yes (seen)    Next appt: 06/14/2022 at 08:15 AM in Oncology ([facility])    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    0 Result Notes           Component Ref Range & Units 2 d ago (05/24/22) 1 mo ago (04/26/22) 1 mo ago (04/05/22) 2 mo ago (03/15/22) 3 mo ago (02/01/22) 4 mo ago (01/11/22) 5 mo ago (12/21/21)  WBC 4.0 - 10.5 K/uL 7.3  6.7  6.2  8.6  5.7  5.7  5.7   RBC 4.22 - 5.81 MIL/uL 4.09 Low   4.21 Low   3.90 Low   4.11 Low   4.13 Low   4.02 Low   4.21 Low    Hemoglobin 13.0 - 17.0 g/dL 13.5  14.0  13.0  13.9  13.7  13.5  13.9   HCT 39.0 - 52.0 % 39.0  40.1  37.7 Low   39.6  40.5  38.4 Low   40.2   MCV 80.0 - 100.0 fL 95.4  95.2  96.7  96.4  98.1  95.5  95.5   MCH 26.0 - 34.0 pg 33.0  33.3  33.3  33.8  33.2  33.6  33.0   MCHC 30.0 - 36.0 g/dL 34.6  34.9  34.5  35.1  33.8  35.2  34.6   RDW 11.5 - 15.5 % 14.6  15.2  14.9  14.6  15.6 High   15.9 High   16.0 High    Platelets 150 - 400 K/uL 148 Low   108 Low  CM  106 Low  CM  125 Low   123 Low  CM  102 Low   90 Low  CM   nRBC 0.0 - 0.2 % 0.0  0.0  0.0  0.0  0.0  0.0  0.0   Neutrophils Relative % % 58  58  62  64  51  57  54   Neutro Abs 1.7 - 7.7 K/uL 4.3  3.9  3.9  5.5  2.9  3.3  3.1   Lymphocytes Relative % '27  28  26  24  '$ 33  30  32   Lymphs Abs 0.7 - 4.0 K/uL 1.9  1.9  1.6  2.1  1.9  1.7  1.8   Monocytes Relative % '9  9  7  7  10  8  9   '$ Monocytes Absolute 0.1 - 1.0 K/uL 0.6  0.6  0.4  0.6  0.6  0.5  0.5   Eosinophils Relative % '3  3  3  3  4  3  3   '$ Eosinophils Absolute 0.0 - 0.5 K/uL 0.2  0.2  0.2  0.3  0.2  0.2  0.2   Basophils Relative % '1  1  1  1  1  1  1   '$ Basophils Absolute 0.0 - 0.1 K/uL 0.1  0.1  0.0  0.1  0.1  0.1  0.0   Immature Granulocytes % '2  1  1  1  1  1  1   '$ Abs Immature Granulocytes 0.00 - 0.07 K/uL 0.11 High   0.06 CM  0.05 CM  0.04 CM  0.05 CM  0.06 CM  0.03 CM   Comment: Performed at [facility], [address] [place], [place] [address]  Resulting Agency  [facility] CLIN LAB [place] CLIN LAB [place]  LAB [place] CLIN LAB [place] CLIN LAB [place] [facility] [place] [facility]         Specimen Collected: 05/24/22 08:07 Last Resulted: 05/24/22 08:20      Lab Flowsheet    Order Details    View Encounter    Lab and Collection Details    Routing    Result History    View All Conversations on this Encounter      CM=Additional comments      Result Care Coordination   Patient Communication   Add Comments   Seen Back to Top       Other Results from 05/24/2022   Contains abnormal data Comprehensive metabolic panel Order: [id number] Status: Final result    Visible to patient: Yes (seen)    Next appt: 06/14/2022 at 08:15 AM in Oncology ([facility])    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    0 Result Notes           Component Ref Range & Units 2 d ago (05/24/22) 1 mo ago (04/26/22) 1 mo ago (04/05/22) 2 mo ago (03/15/22) 3 mo ago (02/01/22) 4 mo ago (01/11/22) 5 mo ago (12/21/21)  Sodium 135 - 145 mmol/L 136  135  136 VC  136  134 Low   135  134 Low    Potassium 3.5 - 5.1 mmol/L 4.0  3.7  3.8 VC  3.6  3.8  3.2 Low   3.3 Low    Chloride 98 - 111 mmol/L 104  104  109 VC  105  106  104  102   CO2 22 - 32 mmol/L '23  22  22 '$ VC  21 Low   20 Low   22  23   Glucose, Bld 70 - 99 mg/dL 107 High   93 CM  102 High  CM  140 High  CM  122 High  CM  168 High  CM  144 High  CM   Comment: Glucose reference range applies only to samples taken after fasting for at least 8 hours.  BUN 8 - 23 mg/dL 30 High   32 High   30 High   38 High   40 High   31 High   24 High    Creatinine, Ser 0.61 - 1.24 mg/dL 1.11  1.10  0.92  1.25 High   1.20  1.11  0.99   Calcium 8.9 - 10.3 mg/dL 9.1  9.3  8.8 Low   8.9  8.8 Low   9.2  9.2   Total Protein 6.5 - 8.1 g/dL 7.5  7.3  6.8  7.0  7.4  7.2  7.4   Albumin 3.5 - 5.0 g/dL 3.7  3.8  3.5  3.8  3.9  3.9  4.0   AST 15 - 41 U/L 27  28  32  33  31  31  37   ALT 0 - 44 U/L '17  20  23  19  20  21  27    '$ Alkaline Phosphatase 38 - 126 U/L 145 High   131 High   119  119  104  97  106   Total Bilirubin 0.3 - 1.2 mg/dL 1.1  0.7  0.9  0.5  0.4  0.6  0.8   GFR, Estimated >60 mL/min >60  >60 CM  >60 CM  >60 CM  >60 CM  >60 CM  >60 CM   Comment: (NOTE)  Calculated using the CKD-EPI Creatinine Equation (2021)  Anion gap 5 - '15 9  9 '$ CM  5 VC, CM  10 CM  8 CM  9 CM  9 CM   Comment: Performed at [facility], [address] [place], [place] [address]  Resulting Agency  [place] CLIN LAB [place] CLIN LAB [place] CLIN LAB [place] CLIN LAB [place] CL[facility] LAB [place] CL[facility] LAB [place] [facility] LAB         Specimen Collected: 05/24/22 08:07 Last Resulted: 05/24/22 08:30      Lab Flowsheet    Order Details    View Encounter    Lab and Collection Details    Routing    Result History    View All Conversations on this Encounter      VC=Value has a corrected status   CM=Additional comments      Result Care Coordination   Patient Communication   Add Comments   Seen Back to Top         Magnesium Order: [id number] Status: Final result    Visible to patient: Yes (seen)    Next appt: 06/14/2022 at 08:15 AM in Oncology ([facility])    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    0 Result Notes           Component Ref Range & Units 2 d ago (05/24/22) 1 mo ago (04/26/22) 1 mo ago (04/05/22) 2 mo ago (03/15/22) 3 mo ago (02/01/22) 4 mo ago (01/11/22) 5 mo ago (12/21/21)  Magnesium 1.7 - 2.4 mg/dL 1.9  2.0 CM  1.8 CM  1.9 CM  2.1 CM  1.8 CM  1.8 CM   Comment: Performed at [facility], [address] [place], [place] [address]  Resulting Agency  [facility] CLIN LAB [place] CLIN LAB [place] CLIN LAB [place] CLIN LAB [place] CLIN LAB [place] [facility] LAB [place] [facility] LAB         Specimen Collected: 05/24/22 08:07 Last Resulted: 05/24/22 08:30      Lab Flowsheet    Order Details    View Encounter    Lab and Collection Details    Routing    Result History    View All Conversations on this Encounter      CM=Additional comments      Result Care  Coordination   Patient Communication   Add Comments   Seen Back to Top          Contains abnormal data CEA Order: [id number] Status: Final result    Visible to patient: Yes (seen)    Next appt: 06/14/2022 at 08:15 AM in Oncology ([facility])    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    0 Result Notes           Component Ref Range & Units 2 d ago (05/24/22) 1 mo ago (04/26/22) 1 mo ago (04/05/22) 2 mo ago (03/15/22) 3 mo ago (02/01/22) 4 mo ago (01/11/22) 6 mo ago (11/09/21)  CEA 0.0 - 4.7 ng/mL 5.7 High   4.8 High  CM  4.5 CM  10.5 High  CM  4.2 CM  3.7 CM  4.3 CM   Comment: (NOTE)                              Nonsmokers          <3.9  Smokers             <5.6  Roche [employer]ostics Electrochemiluminescence Immunoassay  (ECLIA)  Values obtained with different assay methods or kits  cannot be used interchangeably.  Results cannot be  interpreted as absolute evidence of the presence or  absence of malignant disease.  Performed At: [facility]  [address] [place], [place] [id number]  Rush Farmer MD RC:[id number]   Resulting Agency  [facility] CLIN LAB [facility] CLIN LAB [place] CLIN LAB [place] CLIN LAB [place] CLIN LAB [place] CLIN LAB [facility] CLIN LAB         Specimen Collected: 05/24/22 08:07 Last Resulted: 05/25/22 06:36

## 2022-05-31 ENCOUNTER — Other Ambulatory Visit (HOSPITAL_COMMUNITY): Payer: Self-pay

## 2022-06-02 ENCOUNTER — Telehealth: Payer: Self-pay

## 2022-06-02 ENCOUNTER — Telehealth: Payer: Self-pay | Admitting: *Deleted

## 2022-06-02 DIAGNOSIS — C772 Secondary and unspecified malignant neoplasm of intra-abdominal lymph nodes: Secondary | ICD-10-CM | POA: Diagnosis not present

## 2022-06-02 DIAGNOSIS — C189 Malignant neoplasm of colon, unspecified: Secondary | ICD-10-CM | POA: Diagnosis not present

## 2022-06-02 NOTE — Telephone Encounter (Signed)
Call from Mardene Celeste with [facility] to offer Dr [name] to do a Peer to Peer for the Foundation 1 testing that was ordered for this patient, If wanting to to peer review, you must call before noon on Friday 06/04/22  [phone] x [phone]

## 2022-06-02 NOTE — Telephone Encounter (Signed)
They no longer make appointments for peer to peer so the medical director will be calling Dr [name] [phone]. If they do not get an answer they will leave a VM and you can call back. Just needs to be done by Friday at noon

## 2022-06-02 NOTE — Telephone Encounter (Signed)
Can they call me tomorrow or is it going to be a random call?

## 2022-06-02 NOTE — Telephone Encounter (Signed)
I think random for the most part but made it sound like it would be tomorrow

## 2022-06-03 ENCOUNTER — Telehealth: Payer: Self-pay | Admitting: *Deleted

## 2022-06-03 ENCOUNTER — Encounter: Payer: Self-pay | Admitting: Oncology

## 2022-06-03 NOTE — Telephone Encounter (Signed)
We called and got in touch with insurance and there was a per to peer to be done. Dr. Osborn Coho is the docotor and called me and said to feel free to call her anytime tomorrow to discuss that genetic test NGS throught blood that was sent to foundation to get it approved. her direct number is [phone].  Dr. [name] called her this evening and she told her it was approved

## 2022-06-04 DIAGNOSIS — Z4421 Encounter for fitting and adjustment of artificial right eye: Secondary | ICD-10-CM | POA: Diagnosis not present

## 2022-06-04 NOTE — Telephone Encounter (Signed)
The approval number Is [id number]

## 2022-06-04 NOTE — Telephone Encounter (Signed)
It was approved note in his chart

## 2022-06-13 ENCOUNTER — Emergency Department: Payer: Medicare HMO

## 2022-06-13 ENCOUNTER — Other Ambulatory Visit: Payer: Self-pay

## 2022-06-13 ENCOUNTER — Observation Stay
Admission: EM | Admit: 2022-06-13 | Discharge: 2022-06-14 | Disposition: A | Discharge status: Home / Self Care | Payer: Medicare HMO | Attending: Internal Medicine | Admitting: Internal Medicine

## 2022-06-13 DIAGNOSIS — Z20822 Contact with and (suspected) exposure to covid-19: Secondary | ICD-10-CM | POA: Insufficient documentation

## 2022-06-13 DIAGNOSIS — F17210 Nicotine dependence, cigarettes, uncomplicated: Secondary | ICD-10-CM | POA: Insufficient documentation

## 2022-06-13 DIAGNOSIS — R0789 Other chest pain: Secondary | ICD-10-CM | POA: Diagnosis not present

## 2022-06-13 DIAGNOSIS — D696 Thrombocytopenia, unspecified: Secondary | ICD-10-CM

## 2022-06-13 DIAGNOSIS — Z79899 Other long term (current) drug therapy: Secondary | ICD-10-CM | POA: Insufficient documentation

## 2022-06-13 DIAGNOSIS — C772 Secondary and unspecified malignant neoplasm of intra-abdominal lymph nodes: Secondary | ICD-10-CM | POA: Diagnosis not present

## 2022-06-13 DIAGNOSIS — Z7982 Long term (current) use of aspirin: Secondary | ICD-10-CM | POA: Diagnosis not present

## 2022-06-13 DIAGNOSIS — J449 Chronic obstructive pulmonary disease, unspecified: Secondary | ICD-10-CM | POA: Diagnosis not present

## 2022-06-13 DIAGNOSIS — R0689 Other abnormalities of breathing: Secondary | ICD-10-CM | POA: Diagnosis not present

## 2022-06-13 DIAGNOSIS — R251 Tremor, unspecified: Secondary | ICD-10-CM | POA: Diagnosis not present

## 2022-06-13 DIAGNOSIS — N289 Disorder of kidney and ureter, unspecified: Secondary | ICD-10-CM

## 2022-06-13 DIAGNOSIS — Z720 Tobacco use: Secondary | ICD-10-CM

## 2022-06-13 DIAGNOSIS — N179 Acute kidney failure, unspecified: Secondary | ICD-10-CM | POA: Insufficient documentation

## 2022-06-13 DIAGNOSIS — E162 Hypoglycemia, unspecified: Secondary | ICD-10-CM | POA: Diagnosis not present

## 2022-06-13 DIAGNOSIS — R079 Chest pain, unspecified: Secondary | ICD-10-CM | POA: Diagnosis not present

## 2022-06-13 DIAGNOSIS — R9431 Abnormal electrocardiogram [ECG] [EKG]: Secondary | ICD-10-CM | POA: Diagnosis not present

## 2022-06-13 DIAGNOSIS — E161 Other hypoglycemia: Secondary | ICD-10-CM | POA: Diagnosis not present

## 2022-06-13 DIAGNOSIS — C189 Malignant neoplasm of colon, unspecified: Secondary | ICD-10-CM | POA: Insufficient documentation

## 2022-06-13 LAB — COMPREHENSIVE METABOLIC PANEL
ALT: 22 U/L (ref 0–44)
AST: 37 U/L (ref 15–41)
Albumin: 3.6 g/dL (ref 3.5–5.0)
Alkaline Phosphatase: 124 U/L (ref 38–126)
Anion gap: 9 (ref 5–15)
BUN: 28 mg/dL — ABNORMAL HIGH (ref 8–23)
CO2: 23 mmol/L (ref 22–32)
Calcium: 8.8 mg/dL — ABNORMAL LOW (ref 8.9–10.3)
Chloride: 109 mmol/L (ref 98–111)
Creatinine, Ser: 1.48 mg/dL — ABNORMAL HIGH (ref 0.61–1.24)
GFR, Estimated: 50 mL/min — ABNORMAL LOW (ref >60)
Glucose, Bld: 93 mg/dL (ref 70–99)
Potassium: 3.8 mmol/L (ref 3.5–5.1)
Sodium: 141 mmol/L (ref 135–145)
Total Bilirubin: 0.9 mg/dL (ref 0.3–1.2)
Total Protein: 6.8 g/dL (ref 6.5–8.1)

## 2022-06-13 LAB — CBC
HCT: 38.9 % — ABNORMAL LOW (ref 39.0–52.0)
Hemoglobin: 13 g/dL (ref 13.0–17.0)
MCH: 32.8 pg (ref 26.0–34.0)
MCHC: 33.4 g/dL (ref 30.0–36.0)
MCV: 98.2 fL (ref 80.0–100.0)
Platelets: 91 10*3/uL — ABNORMAL LOW (ref 150–400)
RBC: 3.96 MIL/uL — ABNORMAL LOW (ref 4.22–5.81)
RDW: 14.8 % (ref 11.5–15.5)
WBC: 6.9 10*3/uL (ref 4.0–10.5)
nRBC: 0 % (ref 0.0–0.2)

## 2022-06-13 LAB — CK: Total CK: 32 U/L — ABNORMAL LOW (ref 49–397)

## 2022-06-13 LAB — TROPONIN I (HIGH SENSITIVITY): Troponin I (High Sensitivity): 4 ng/L (ref <18)

## 2022-06-13 LAB — SARS CORONAVIRUS 2 BY RT PCR: SARS Coronavirus 2 by RT PCR: NEGATIVE

## 2022-06-13 MED ORDER — ONDANSETRON HCL 4 MG/2ML IJ SOLN: 4.0000 mg | Freq: Four times a day (QID) | INTRAMUSCULAR | Status: DC | PRN

## 2022-06-13 MED ORDER — ALBUTEROL SULFATE (2.5 MG/3ML) 0.083% IN NEBU: 2.5000 mg | INHALATION_SOLUTION | RESPIRATORY_TRACT | Status: DC | PRN

## 2022-06-13 MED ORDER — LORATADINE 10 MG PO TABS
10.0000 mg | ORAL_TABLET | Freq: Every day | ORAL | Status: DC
  Administered 2022-06-14: 10 mg via ORAL
  Filled 2022-06-13: qty 1

## 2022-06-13 MED ORDER — LORAZEPAM 2 MG/ML IJ SOLN
1.0000 mg | Freq: Once | INTRAMUSCULAR | Status: AC
  Administered 2022-06-13: 1 mg via INTRAVENOUS
  Filled 2022-06-13: qty 1

## 2022-06-13 MED ORDER — BEMPEDOIC ACID 180 MG PO TABS: 1.0000 | ORAL_TABLET | Freq: Every day | ORAL | Status: DC

## 2022-06-13 MED ORDER — TAMSULOSIN HCL 0.4 MG PO CAPS
0.4000 mg | ORAL_CAPSULE | Freq: Every day | ORAL | Status: DC
  Administered 2022-06-14: 0.4 mg via ORAL
  Filled 2022-06-13: qty 1

## 2022-06-13 MED ORDER — FLUTICASONE PROPIONATE 50 MCG/ACT NA SUSP
2.0000 | Freq: Every day | NASAL | Status: DC | PRN
Start: 2022-06-13 — End: 2022-06-14

## 2022-06-13 MED ORDER — AZELASTINE HCL 0.1 % NA SOLN: 1.0000 | Freq: Every day | NASAL | Status: DC | PRN

## 2022-06-13 MED ORDER — MONTELUKAST SODIUM 10 MG PO TABS
10.0000 mg | ORAL_TABLET | Freq: Every day | ORAL | Status: DC
  Administered 2022-06-13: 10 mg via ORAL
  Filled 2022-06-13: qty 1

## 2022-06-13 MED ORDER — GADOBUTROL 1 MMOL/ML IV SOLN
7.0000 mL | Freq: Once | INTRAVENOUS | Status: AC | PRN
  Administered 2022-06-13: 7.5 mL via INTRAVENOUS

## 2022-06-13 MED ORDER — ALLOPURINOL 100 MG PO TABS
100.0000 mg | ORAL_TABLET | Freq: Every day | ORAL | Status: DC
  Administered 2022-06-14: 100 mg via ORAL
  Filled 2022-06-13: qty 1

## 2022-06-13 MED ORDER — SODIUM CHLORIDE 0.9 % IV SOLN: 250.0000 mL | INTRAVENOUS | Status: DC | PRN

## 2022-06-13 MED ORDER — CHLORTHALIDONE 25 MG PO TABS
25.0000 mg | ORAL_TABLET | Freq: Every day | ORAL | Status: DC
  Administered 2022-06-14: 25 mg via ORAL
  Filled 2022-06-13: qty 1

## 2022-06-13 MED ORDER — SIMVASTATIN 20 MG PO TABS: 20.0000 mg | ORAL_TABLET | Freq: Every day | ORAL | Status: DC

## 2022-06-13 MED ORDER — POLYETHYLENE GLYCOL 3350 17 G PO PACK
17.0000 g | PACK | Freq: Every day | ORAL | Status: DC | PRN
Start: 2022-06-13 — End: 2022-06-14

## 2022-06-13 MED ORDER — ASPIRIN 81 MG PO TBEC
81.0000 mg | DELAYED_RELEASE_TABLET | Freq: Every day | ORAL | Status: DC
  Administered 2022-06-14: 81 mg via ORAL
  Filled 2022-06-13: qty 1

## 2022-06-13 MED ORDER — HEPARIN SODIUM (PORCINE) 5000 UNIT/ML IJ SOLN
5000.0000 [IU] | Freq: Three times a day (TID) | INTRAMUSCULAR | Status: DC
  Administered 2022-06-13 – 2022-06-14 (×2): 5000 [IU] via SUBCUTANEOUS
  Filled 2022-06-13 (×2): qty 1

## 2022-06-13 MED ORDER — SODIUM CHLORIDE 0.9% FLUSH: 3.0000 mL | INTRAVENOUS | Status: DC | PRN

## 2022-06-13 MED ORDER — SODIUM CHLORIDE 0.9 % IV BOLUS
1000.0000 mL | Freq: Once | INTRAVENOUS | Status: AC
  Administered 2022-06-13: 1000 mL via INTRAVENOUS

## 2022-06-13 MED ORDER — PANTOPRAZOLE SODIUM 40 MG PO TBEC
40.0000 mg | DELAYED_RELEASE_TABLET | Freq: Every day | ORAL | Status: DC
  Administered 2022-06-14: 40 mg via ORAL
  Filled 2022-06-13: qty 1

## 2022-06-13 MED ORDER — BUTALBITAL-APAP-CAFFEINE 50-325-40 MG PO TABS
1.0000 | ORAL_TABLET | ORAL | Status: DC | PRN
  Administered 2022-06-13: 1 via ORAL
  Filled 2022-06-13: qty 1

## 2022-06-13 MED ORDER — ONDANSETRON HCL 4 MG PO TABS: 4.0000 mg | ORAL_TABLET | Freq: Four times a day (QID) | ORAL | Status: DC | PRN

## 2022-06-13 MED ORDER — CAPECITABINE 500 MG PO TABS: 500.0000 mg | ORAL_TABLET | Freq: Two times a day (BID) | ORAL | Status: DC

## 2022-06-13 MED ORDER — MOMETASONE FURO-FORMOTEROL FUM 100-5 MCG/ACT IN AERO
2.0000 | INHALATION_SPRAY | Freq: Two times a day (BID) | RESPIRATORY_TRACT | Status: DC
  Administered 2022-06-14: 2 via RESPIRATORY_TRACT
  Filled 2022-06-13: qty 8.8

## 2022-06-13 MED ORDER — PREGABALIN 75 MG PO CAPS
75.0000 mg | ORAL_CAPSULE | Freq: Two times a day (BID) | ORAL | Status: DC
  Administered 2022-06-13 – 2022-06-14 (×2): 75 mg via ORAL
  Filled 2022-06-13 (×2): qty 1

## 2022-06-13 MED ORDER — DULOXETINE HCL 30 MG PO CPEP
30.0000 mg | ORAL_CAPSULE | Freq: Two times a day (BID) | ORAL | Status: DC
  Administered 2022-06-14: 30 mg via ORAL
  Filled 2022-06-13: qty 1

## 2022-06-13 MED ORDER — CYCLOBENZAPRINE HCL 10 MG PO TABS: 10.0000 mg | ORAL_TABLET | Freq: Three times a day (TID) | ORAL | Status: DC | PRN

## 2022-06-13 MED ORDER — LISINOPRIL 5 MG PO TABS
5.0000 mg | ORAL_TABLET | Freq: Every day | ORAL | Status: DC
  Administered 2022-06-14: 5 mg via ORAL
  Filled 2022-06-13: qty 1

## 2022-06-13 MED ORDER — SODIUM CHLORIDE 0.9% FLUSH
3.0000 mL | Freq: Two times a day (BID) | INTRAVENOUS | Status: DC
Start: 2022-06-13 — End: 2022-06-14
  Administered 2022-06-13 – 2022-06-14 (×2): 3 mL via INTRAVENOUS

## 2022-06-13 NOTE — Hospital Course (Addendum)
Patient is a 71 year old with metastatic colon cancer who is on a chemotherapeutic regimen.  He was in his usual state of health until he developed new onset tremor today that caused him to be unable to walk or do much of anything and that lasted hours.  Improved with Ativan.  Neurology consult obtained, MRI of the brain did not show any acute changes or brain metastasis.  Tremor could reflect anxiety versus panic attack.  Condition so far had improved, patient is medically stable to be discharged.  Patient is advised to follow-up with PCP as outpatient.

## 2022-06-13 NOTE — Assessment & Plan Note (Signed)
He has an oncology plan and is on an oral chemotherapeutic agent that he takes twice daily.  Follow-up with hematology as an outpatient for further treatment. There is no current evidence of metastatic colon cancer of the brain that might have been responsible for his symptoms today.

## 2022-06-13 NOTE — Assessment & Plan Note (Signed)
Unclear etiology.  Sounds like he might of been experiencing rigors, though I do not have a good reason for this, which have seemed to have resolved after getting 1 dose of Ativan in the ED.  Neurology will see him in the morning for further assessment of focal seizures. Consider assessment of his medications with pharmacy to see if any of them are contributing.

## 2022-06-13 NOTE — Assessment & Plan Note (Signed)
Unclear etiology, negative CK Avoid nephrotoxic agents Trend Consider if any of his medications may be contributing.

## 2022-06-13 NOTE — ED Triage Notes (Addendum)
Pt from [place] via EMS, lives at home- pt states this morning he felt cold and was shaking a little bit, he then went to [facility] and started having involuntary tremors in arms and legs. Pt c/o muscular pain all over and CP, denies any history of seizures or chronic alcohol use, denies drug use. Pt is AOX4, in NAD, obvious tremors in arms and legs, no slurred speech or trouble speaking noted. Pt has history of stage 4 colon cancer for which he gets chemo (next appointment is tomorrow), has port in left upper chest.  BGL- 102 Eco2- 20 RR-40 118/72 Temp 99.1 oral ST on monitor- 103 324 of aspirin given en route

## 2022-06-13 NOTE — Assessment & Plan Note (Signed)
Likely related to chemotherapy.

## 2022-06-13 NOTE — Assessment & Plan Note (Signed)
Patient offered and declined nicotine replacement 

## 2022-06-13 NOTE — ED Provider Notes (Signed)
[facility] Provider Note    Event Date/Time   First MD Initiated Contact with Patient 06/13/22 1415     (approximate)  History   Chief Complaint: Tremors  HPI  Eric Simon is a 71 y.o. male with a past medical history of stage IV colon cancer on chemotherapy, COPD, hyperlipidemia, presents to the emergency department for new tremor.  According to the patient since this morning he has had involuntary full body tremor.  During my evaluation patient is shaking mostly in the upper extremities bilaterally.  Patient is conversing normally and denies any headache or trouble speaking or thinking.  Patient states this is never happened before.  Denies any focal weakness or numbness.  Patient has known stage IV colon cancer on chemotherapy but denies any brain metastases.  Physical Exam   Triage Vital Signs: ED Triage Vitals  Enc Vitals Group     BP      Pulse      Resp      Temp      Temp src      SpO2      Weight      Height      Head Circumference      Peak Flow      Pain Score      Pain Loc      Pain Edu?      Excl. in [place]?     Most recent vital signs: There were no vitals filed for this visit.  General: Awake, no distress.  CV:  Good peripheral perfusion.  Regular rate and rhythm  Resp:  Normal effort.  Equal breath sounds bilaterally.  Abd:  No distention.  Soft, nontender.  No rebound or guarding.  ED Results / Procedures / Treatments   EKG  EKG viewed and interpreted by myself shows a sinus rhythm at 97 bpm with a narrow QRS, normal axis, slight PR prolongation otherwise normal intervals with nonspecific ST changes without ST elevation.  RADIOLOGY  I have viewed and interpreted the chest x-ray images I do not see any significant abnormality seen on my evaluation. Radiology is read the chest x-ray as atelectasis versus infiltrate left lung base.   MEDICATIONS ORDERED IN ED: Medications  LORazepam (ATIVAN) injection 1 mg (has no  administration in time range)  sodium chloride 0.9 % bolus 1,000 mL (has no administration in time range)     IMPRESSION / MDM / ASSESSMENT AND PLAN / ED COURSE  I reviewed the triage vital signs and the nursing notes.  Patient's presentation is most consistent with acute presentation with potential threat to life or bodily function.  Patient presents emergency department for diffuse tremor which is new since this morning.  Patient has stage IV colon cancer denies any focal weakness or numbness.  Denies any brain metastases.  Patient is currently on chemotherapy is scheduled for chemotherapy again tomorrow.  We will check labs including a CK given the persistent tremor and feeling of muscle soreness throughout his body.  No obvious focal deficits on my exam.  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  Neurological exam is difficult given the tremor but appears to have good strength at least in all extremities.  We will proceed with an MRI of the brain with and without contrast to evaluate for possible brain metastases.  We will treat with IV Ativan and fluids to see if we can calm the tremor down  while awaiting work-up.  Patient's tremor has improved after Ativan administration.  MRI of the patient's brain shows no significant abnormality.  No sign of metastatic disease.  The remainder the patient's work-up has been nonrevealing.  Patient has mild renal insufficiency receiving IV fluids.  CBC is normal.  Troponin negative.  COVID-negative.  CK of 32.  Patient per report was having significant trouble ambulating at home due to the shaking and tremor.  Tremor is quite significant upon arrival continues have a mild tremor.  I spoke to Dr. Curly Shores of neurology.  She states given the significant tremor and difficulty urinating due to the tremor at home she recommends admission to the hospital for continued monitoring and she will see the patient in the morning to see  what further evaluation needs to be performed from a neurological standpoint.  I discussed this with the patient who is agreeable to this plan of care.  FINAL CLINICAL IMPRESSION(S) / ED DIAGNOSES   Tremor   Note:  This document was prepared using Dragon voice recognition software and may include unintentional dictation errors.   Harvest Dark, MD 06/13/22 [phone]

## 2022-06-13 NOTE — H&P (Signed)
History and Physical    Patient: Eric Simon XIP:[id number] DOB: 1951/06/09 DOA: 06/13/2022 DOS: the patient was seen and examined on 06/13/2022 PCP: Jodi Marble, MD  Patient coming from: Home  Chief Complaint:  Chief Complaint  Patient presents with   Tremors   HPI: Eric Simon is a 71 y.o. male with medical history significant of tobacco use, COPD, hyperlipidemia with metastatic colon cancer.  He is in treatment on Vectibix infusions with heme-onc as well as daily oral Xeloda.  He was in his usual state of health until he developed what sounds like rigors today with significant tremor and being unable to walk.  This lasted for several hours and prompted him to come to the ED the EDP was impressed with his tremor and gave him 1 dose of Ativan which seemed to help and then they seem to return.  Patient did undergo MRI scanning which showed no evidence of metastatic disease.  EDP reviewed the case with neurology who wanted him admitted for an in person evaluation of focal seizures in the morning.  Review of Systems: As mentioned in the history of present illness. All other systems reviewed and are negative. Past Medical History:  Diagnosis Date   Colon cancer ([place])    COPD (chronic obstructive pulmonary disease) ([place])    Hyperlipidemia    Past Surgical History:  Procedure Laterality Date   COLONOSCOPY WITH PROPOFOL N/A 06/26/2019   Procedure: COLONOSCOPY WITH PROPOFOL;  Surgeon: [name], MD;  Location: [facility] ENDOSCOPY;  Service: Gastroenterology;  Laterality: N/A;   FLEXIBLE SIGMOIDOSCOPY N/A 05/07/2021   Procedure: FLEXIBLE SIGMOIDOSCOPY;  Surgeon: [name], MD;  Location: [facility] ENDOSCOPY;  Service: Gastroenterology;  Laterality: N/A;   PORTA CATH INSERTION N/A 06/25/2019   Procedure: PORTA CATH INSERTION;  Surgeon: Algernon Huxley, MD;  Location: [place] CV LAB;  Service: Cardiovascular;  Laterality: N/A;   PORTA CATH INSERTION Left 08/11/2020   Procedure: PORTA  CATH INSERTION;  Surgeon: Algernon Huxley, MD;  Location: [place] [facility] LAB;  Service: Cardiovascular;  Laterality: Left;   PORTA CATH REMOVAL Right 08/11/2020   Procedure: PORTA CATH REMOVAL;  Surgeon: Algernon Huxley, MD;  Location: [place] [facility] LAB;  Service: Cardiovascular;  Laterality: Right;   Social History:  reports that he has been smoking cigarettes. He has a 20.00 pack-year smoking history. He has never used smokeless tobacco. He reports that he does not drink alcohol and does not use drugs.  No Known Allergies  Family History  Problem Relation Age of Onset   Brain cancer Father     Prior to Admission medications   Medication Sig Start Date End Date Taking? Authorizing Provider  allopurinol (ZYLOPRIM) 100 MG tablet Take 100 mg by mouth daily. 05/14/21   [provider]  aspirin EC 81 MG tablet Take 81 mg by mouth daily.    [provider]  azelastine (ASTELIN) 0.1 % nasal spray Place 1 spray into both nostrils daily as needed. 08/23/19   [provider]  butalbital-acetaminophen-caffeine (FIORICET) 50-325-40 MG tablet Take 1 tablet by mouth every 4 (four) hours as needed. 03/25/20   [provider]  capecitabine (XELODA) 500 MG tablet Take 1 tablet (500 mg total) by mouth 2 (two) times daily after a meal. Take for 14 days, then hold for 7 days. Repeat every 21 days. 05/26/22   Sindy Guadeloupe, MD  cetirizine (ZYRTEC) 10 MG tablet Take 1 tablet (10 mg total) by mouth daily. 12/14/16  Hagler, Jami L, PA-C  chlorthalidone (HYGROTON) 25 MG tablet Take 1 tablet by mouth daily. 11/21/19   [provider]  cyclobenzaprine (FLEXERIL) 10 MG tablet Take 1 tablet (10 mg total) by mouth 3 (three) times daily as needed for muscle spasms. 05/24/22   Sindy Guadeloupe, MD  DEXILANT 60 MG capsule Take 60 mg by mouth daily. 08/12/21   [provider]  DULoxetine (CYMBALTA) 30 MG capsule Take 1 capsule (30 mg total) by mouth 2 (two) times daily. 05/05/20    Sindy Guadeloupe, MD  fluticasone (FLONASE) 50 MCG/ACT nasal spray Place 2 sprays into both nostrils daily as needed. 11/21/19   [provider]  fluticasone (VERAMYST) 27.5 MCG/SPRAY nasal spray Place 2 sprays into the nose daily as needed.    [provider]  lidocaine-prilocaine (EMLA) cream Apply 1 application  topically as needed (pt will put 1 inch of cream over port 1 hourfor each treatment, cover plastic over the cream protect clothes). 05/24/22   Sindy Guadeloupe, MD  lisinopril (ZESTRIL) 5 MG tablet Take 5 mg by mouth daily. 08/12/21   [provider]  loperamide (IMODIUM A-D) 2 MG tablet Take 1 tablet (2 mg total) by mouth 4 (four) times daily as needed. Take 2 at diarrhea onset , then 1 every 2hr until 12hrs with no BM. May take 2 every 4hrs at night. If diarrhea recurs repeat. Patient not taking: Reported on 05/24/2022 07/20/21   Sindy Guadeloupe, MD  magic mouthwash (multi-ingredient) oral suspension Swish and spit 5-10 mls by mouth 4 times a day as needed Patient not taking: Reported on 04/05/2022 08/21/21   Darl Pikes, RPH-CPP  montelukast (SINGULAIR) 10 MG tablet Take 10 mg by mouth at bedtime.    [provider]  NEXLETOL 180 MG TABS Take 1 tablet by mouth daily. 05/15/21   [provider]  oxyCODONE (OXY IR/ROXICODONE) 5 MG immediate release tablet Take 1 tablet (5 mg total) by mouth every 6 (six) hours as needed for severe pain. Patient not taking: Reported on 04/05/2022 08/01/20   Sindy Guadeloupe, MD  pregabalin (LYRICA) 75 MG capsule Take 1 capsule (75 mg total) by mouth 2 (two) times daily. 04/09/22   Sindy Guadeloupe, MD  simvastatin (ZOCOR) 20 MG tablet Take 20 mg by mouth daily.     [provider]  SYMBICORT 80-4.5 MCG/ACT inhaler Inhale 2 puffs into the lungs daily. 11/21/19   [provider]  tamsulosin (FLOMAX) 0.4 MG CAPS capsule Take 0.4 mg by mouth.     [provider]  prochlorperazine (COMPAZINE) 10 MG tablet  Take 1 tablet (10 mg total) by mouth every 6 (six) hours as needed (Nausea or vomiting). 06/20/19 07/20/21  Sindy Guadeloupe, MD    Physical Exam: Vitals:   06/13/22 1837 06/13/22 1900 06/13/22 2000 06/13/22 2030  BP: 124/79 120/74 106/60 119/60  Pulse: (!) 104 80 80 84  Resp: 19 (!) 26 (!) 21 (!) 25  Temp:      TempSrc:      SpO2: 91% 94% 96% 94%   Physical Examination: General appearance - alert, well appearing, and in no distress and ill-appearing Mental status - alert, oriented to person, place, and time Neck - supple, no significant adenopathy Heart - normal rate, regular rhythm, normal S1, S2, no murmurs, rubs, clicks or gallops Abdomen - soft, nontender, nondistended, no masses or organomegaly Neurological - alert, oriented, normal speech, no focal findings or movement disorder noted Extremities -  peripheral pulses normal, no pedal edema, no clubbing or cyanosis Skin - normal coloration and turgor, no rashes, no suspicious skin lesions noted  Data Reviewed:  Results for orders placed or performed during the hospital encounter of 06/13/22 (from the past 24 hour(s))  CBC     Status: Abnormal   Collection Time: 06/13/22  2:17 PM  Result Value Ref Range   WBC 6.9 4.0 - 10.5 K/uL   RBC 3.96 (L) 4.22 - 5.81 MIL/uL   Hemoglobin 13.0 13.0 - 17.0 g/dL   HCT 38.9 (L) 39.0 - 52.0 %   MCV 98.2 80.0 - 100.0 fL   MCH 32.8 26.0 - 34.0 pg   MCHC 33.4 30.0 - 36.0 g/dL   RDW 14.8 11.5 - 15.5 %   Platelets 91 (L) 150 - 400 K/uL   nRBC 0.0 0.0 - 0.2 %  Comprehensive metabolic panel     Status: Abnormal   Collection Time: 06/13/22  2:17 PM  Result Value Ref Range   Sodium 141 135 - 145 mmol/L   Potassium 3.8 3.5 - 5.1 mmol/L   Chloride 109 98 - 111 mmol/L   CO2 23 22 - 32 mmol/L   Glucose, Bld 93 70 - 99 mg/dL   BUN 28 (H) 8 - 23 mg/dL   Creatinine, Ser 1.48 (H) 0.61 - 1.24 mg/dL   Calcium 8.8 (L) 8.9 - 10.3 mg/dL   Total Protein 6.8 6.5 - 8.1 g/dL   Albumin 3.6 3.5 - 5.0 g/dL   AST 37  15 - 41 U/L   ALT 22 0 - 44 U/L   Alkaline Phosphatase 124 38 - 126 U/L   Total Bilirubin 0.9 0.3 - 1.2 mg/dL   GFR, Estimated 50 (L) >60 mL/min   Anion gap 9 5 - 15  CK     Status: Abnormal   Collection Time: 06/13/22  2:17 PM  Result Value Ref Range   Total CK 32 (L) 49 - 397 U/L  Troponin I (High Sensitivity)     Status: None   Collection Time: 06/13/22  2:18 PM  Result Value Ref Range   Troponin I (High Sensitivity) 4 <18 ng/L  SARS Coronavirus 2 by RT PCR (hospital order, performed in [place] hospital lab) *cepheid single result test* Anterior Nasal Swab     Status: None   Collection Time: 06/13/22  6:00 PM   Specimen: Anterior Nasal Swab  Result Value Ref Range   SARS Coronavirus 2 by RT PCR NEGATIVE NEGATIVE   MR Brain W and Wo Contrast  Result Date: 06/13/2022 CLINICAL DATA:  New onset of upper and lower extremity tremors. The patient is currently undergoing therapy for colon cancer. EXAM: MRI HEAD WITHOUT AND WITH CONTRAST TECHNIQUE: Multiplanar, multiecho pulse sequences of the brain and surrounding structures were obtained without and with intravenous contrast. CONTRAST:  7.62m GADAVIST GADOBUTROL 1 MMOL/ML IV SOLN COMPARISON:  MR head 03/10/2016 FINDINGS: Brain: No acute infarct, hemorrhage, or mass lesion is present. Mild periventricular and subcortical T2 hyperintensities bilaterally are stable and likely within normal limits for age. The ventricles are of normal size. No significant extraaxial fluid collection is present. Basal ganglia are within normal limits. The internal auditory canals are within normal limits. The brainstem and cerebellum are within normal limits. Vascular: Flow is present in the major intracranial arteries. Skull and upper cervical spine: The craniocervical junction is normal. Upper cervical spine is within normal limits. Marrow signal is unremarkable. Sinuses/Orbits: Bilateral mastoid effusions are present. No obstructing  nasopharyngeal lesion is  present. The paranasal sinuses and mastoid air cells are otherwise clear. Right globe replacement noted. Globes and orbits are otherwise within normal limits. IMPRESSION: 1. Normal MRI appearance of the brain for age. 2. Bilateral mastoid effusions. No obstructing nasopharyngeal lesion is present. Electronically Signed   By: San Morelle M.D.   On: 06/13/2022 18:08   DG Chest 2 View  Result Date: 06/13/2022 CLINICAL DATA:  [place] cold, began having involuntary tremors in arms and legs, diffuse muscular pain, history stage IV colon cancer, COPD EXAM: CHEST - 2 VIEW COMPARISON:  05/24/2019 FINDINGS: LEFT jugular Port-A-Cath with tip projecting over RIGHT atrium. Enlargement of cardiac silhouette with slight vascular congestion. Mediastinal contours normal. Asked Ross calcification aorta. Subsegmental atelectasis RIGHT base. Hypoinflated lungs versus previous exam with questionable retrocardiac LEFT lower lobe infiltrate or atelectasis. Upper lungs clear. No pleural effusion or pneumothorax. IMPRESSION: Question mild atelectasis versus infiltrate at LEFT lung base. Subsegmental atelectasis RIGHT base. Electronically Signed   By: Lavonia Dana M.D.   On: 06/13/2022 14:51     Assessment and Plan: * Tremor of unknown origin Unclear etiology.  Sounds like he might of been experiencing rigors, though I do not have a good reason for this, which have seemed to have resolved after getting 1 dose of Ativan in the ED.  Neurology will see him in the morning for further assessment of focal seizures. Consider assessment of his medications with pharmacy to see if any of them are contributing.  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  Follow-up with hematology as an outpatient for further treatment. There is no current evidence of metastatic colon cancer of the brain that might have been responsible for  his symptoms today.  Tobacco use Patient offered and declined nicotine replacement  Thrombocytopenia ([place]) Likely related to chemotherapy.  Acute renal insufficiency Unclear etiology, negative CK Avoid nephrotoxic agents Trend Consider if any of his medications may be contributing.      Advance Care Planning:   Code Status: Not on file full  Consults: Neurology by EDP they should see him in the morning  Family Communication: Patient at bedside  Severity of Illness: The appropriate patient status for this patient is INPATIENT. Inpatient status is judged to be reasonable and necessary in order to provide the required intensity of service to ensure the patient's safety. The patient's presenting symptoms, physical exam findings, and initial radiographic and laboratory data in the context of their chronic comorbidities is felt to place them at high risk for further clinical deterioration. Furthermore, it is not anticipated that the patient will be medically stable for discharge from the hospital within 2 midnights of admission.  I suspect work-up will take longer than 1 midnight.  I certify that at the point of admission it is my clinical judgment that the patient will require inpatient hospital care spanning beyond 2 midnights from the point of admission due to high intensity of service, high risk for further deterioration and high frequency of surveillance required.  Author: Donnamae Jude, MD 06/13/2022 9:32 PM  For on call review [email or web address].

## 2022-06-13 NOTE — ED Notes (Signed)
Spoke with MRI and informed them that pt was ready

## 2022-06-14 ENCOUNTER — Telehealth: Payer: Self-pay | Admitting: Oncology

## 2022-06-14 ENCOUNTER — Inpatient Hospital Stay: Payer: Medicare HMO | Admitting: Oncology

## 2022-06-14 ENCOUNTER — Inpatient Hospital Stay: Payer: Medicare HMO

## 2022-06-14 DIAGNOSIS — R251 Tremor, unspecified: Secondary | ICD-10-CM | POA: Diagnosis not present

## 2022-06-14 DIAGNOSIS — C772 Secondary and unspecified malignant neoplasm of intra-abdominal lymph nodes: Secondary | ICD-10-CM

## 2022-06-14 DIAGNOSIS — N289 Disorder of kidney and ureter, unspecified: Secondary | ICD-10-CM | POA: Diagnosis not present

## 2022-06-14 DIAGNOSIS — C189 Malignant neoplasm of colon, unspecified: Secondary | ICD-10-CM | POA: Diagnosis not present

## 2022-06-14 LAB — CBC
HCT: 37.4 % — ABNORMAL LOW (ref 39.0–52.0)
Hemoglobin: 12.7 g/dL — ABNORMAL LOW (ref 13.0–17.0)
MCH: 33.1 pg (ref 26.0–34.0)
MCHC: 34 g/dL (ref 30.0–36.0)
MCV: 97.4 fL (ref 80.0–100.0)
Platelets: 83 10*3/uL — ABNORMAL LOW (ref 150–400)
RBC: 3.84 MIL/uL — ABNORMAL LOW (ref 4.22–5.81)
RDW: 14.8 % (ref 11.5–15.5)
WBC: 7.2 10*3/uL (ref 4.0–10.5)
nRBC: 0 % (ref 0.0–0.2)

## 2022-06-14 LAB — BASIC METABOLIC PANEL
Anion gap: 7 (ref 5–15)
BUN: 25 mg/dL — ABNORMAL HIGH (ref 8–23)
CO2: 22 mmol/L (ref 22–32)
Calcium: 8.8 mg/dL — ABNORMAL LOW (ref 8.9–10.3)
Chloride: 112 mmol/L — ABNORMAL HIGH (ref 98–111)
Creatinine, Ser: 1.29 mg/dL — ABNORMAL HIGH (ref 0.61–1.24)
GFR, Estimated: 59 mL/min — ABNORMAL LOW (ref >60)
Glucose, Bld: 93 mg/dL (ref 70–99)
Potassium: 3.9 mmol/L (ref 3.5–5.1)
Sodium: 141 mmol/L (ref 135–145)

## 2022-06-14 LAB — TROPONIN I (HIGH SENSITIVITY): Troponin I (High Sensitivity): 10 ng/L (ref <18)

## 2022-06-14 MED ORDER — CAPECITABINE 500 MG PO TABS
500.0000 mg | ORAL_TABLET | Freq: Two times a day (BID) | ORAL | Status: DC
  Administered 2022-06-14: 500 mg via ORAL
  Filled 2022-06-14: qty 1

## 2022-06-14 MED ORDER — CAPECITABINE 500 MG PO TABS: 500.0000 mg | ORAL_TABLET | Freq: Two times a day (BID) | ORAL | Status: DC

## 2022-06-14 MED FILL — Dexamethasone Sodium Phosphate Inj 100 MG/10ML: INTRAMUSCULAR | Qty: 1 | Status: AC

## 2022-06-14 NOTE — Discharge Summary (Addendum)
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 [facility] Course: Patient is a 71 year old with metastatic colon cancer who is on a chemotherapeutic regimen.  He was in his usual state of health until he developed new onset tremor today that caused him to be unable to walk or do much of anything and that lasted hours.  Improved with Ativan.  Neurology consult obtained, MRI of the brain did not show any acute changes or brain metastasis.  Tremor could reflect anxiety versus panic attack.  Condition so far had improved, patient is medically stable to be discharged.  Patient is advised to follow-up with PCP as outpatient.  Assessment and Plan: Tremor. Patient is seen by neurology, no evidence of seizure.  This may reflect anxiety versus a panic attack.  Symptoms resolved after giving a dose of Ativan. At this point, he is medically stable to be discharged.  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 Follow-up with oncology as outpatient.  Thrombocytopenia. Due to chemo. Follow with oncology  Acute renal insufficiency. Pretty mild, repeat at this morning is better.  Follow-up with PCP as outpatient       Consultants: Neurology Procedures performed: None  Disposition: Home Diet recommendation:  Discharge Diet Orders (From admission, onward)     Start     Ordered   06/14/22 0000  Diet - low sodium heart healthy        06/14/22 1351           Cardiac  diet DISCHARGE MEDICATION: Allergies as of 06/14/2022   No Known Allergies      Medication List     TAKE these medications    allopurinol 100 MG tablet Commonly known as: ZYLOPRIM Take 100 mg by mouth daily.   aspirin EC 81 MG tablet Take 81 mg by mouth daily.   azelastine 0.1 % nasal spray Commonly known as: ASTELIN Place 1 spray into both nostrils daily as needed.   butalbital-acetaminophen-caffeine 50-325-40 MG tablet Commonly known as: FIORICET Take 1 tablet by mouth every 4 (four) hours as needed.   capecitabine 500 MG tablet Commonly known as: XELODA Take 1 tablet (500 mg total) by mouth 2 (two) times daily after a meal. Take for 14 days, then hold for 7 days. Repeat every 21 days.   cetirizine 10 MG tablet Commonly known as: ZYRTEC Take 1 tablet (10 mg total) by mouth daily.   chlorthalidone 25 MG tablet Commonly known as: HYGROTON Take 1 tablet by mouth daily.   cyclobenzaprine 10 MG tablet Commonly known as: FLEXERIL Take 1 tablet (10 mg total) by mouth 3 (three) times daily as needed for muscle spasms.   Dexilant 60 MG capsule Generic drug: dexlansoprazole Take 60 mg by mouth daily.   DULoxetine 30 MG capsule Commonly known as: CYMBALTA Take 1 capsule (30 mg total) by mouth 2 (two) times daily.   fluticasone 50 MCG/ACT nasal spray Commonly known as: FLONASE Place 2 sprays into both nostrils daily as needed.  lidocaine-prilocaine cream Commonly known as: EMLA Apply 1 application  topically as needed (pt will put 1 inch of cream over port 1 hourfor each treatment, cover plastic over the cream protect clothes).   lisinopril 5 MG tablet Commonly known as: ZESTRIL Take 5 mg by mouth daily.   loperamide 2 MG tablet Commonly known as: Imodium A-D Take 1 tablet (2 mg total) by mouth 4 (four) times daily as needed. Take 2 at diarrhea onset , then 1 every 2hr until 12hrs with no BM. May take 2 every 4hrs at night. If diarrhea recurs repeat.   magic  mouthwash (multi-ingredient) oral suspension Swish and spit 5-10 mls by mouth 4 times a day as needed   montelukast 10 MG tablet Commonly known as: SINGULAIR Take 10 mg by mouth at bedtime.   Nexletol 180 MG Tabs Generic drug: Bempedoic Acid Take 1 tablet by mouth daily.   oxyCODONE 5 MG immediate release tablet Commonly known as: Oxy IR/ROXICODONE Take 1 tablet (5 mg total) by mouth every 6 (six) hours as needed for severe pain.   pregabalin 75 MG capsule Commonly known as: Lyrica Take 1 capsule (75 mg total) by mouth 2 (two) times daily.   simvastatin 20 MG tablet Commonly known as: ZOCOR Take 20 mg by mouth daily.   Symbicort 80-4.5 MCG/ACT inhaler Generic drug: budesonide-formoterol Inhale 2 puffs into the lungs daily.   tamsulosin 0.4 MG Caps capsule Commonly known as: FLOMAX Take 0.4 mg by mouth.        Follow-up Information     Jodi Marble, MD Follow up in 1 week(s).   Specialty: Internal Medicine Contact information: [address] [place] [id number] [phone]                Discharge Exam: Danley Danker Weights   06/13/22 2200  Weight: 72.3 kg   General exam: Appears calm and comfortable  Respiratory system: Clear to auscultation. Respiratory effort normal. Cardiovascular system: S1 & S2 heard, RRR. No JVD, murmurs, rubs, gallops or clicks. No pedal edema. Gastrointestinal system: Abdomen is nondistended, soft and nontender. No organomegaly or masses felt. Normal bowel sounds heard. Central nervous system: Alert and oriented. No focal neurological deficits. Extremities: Symmetric 5 x 5 power. Skin: No rashes, lesions or ulcers Psychiatry: Judgement and insight appear normal. Mood & affect appropriate.    Condition at discharge: good  The results of significant diagnostics from this hospitalization (including imaging, microbiology, ancillary and laboratory) are listed below for reference.   Imaging Studies: MR Brain W and Wo  Contrast  Result Date: 06/13/2022 CLINICAL DATA:  New onset of upper and lower extremity tremors. The patient is currently undergoing therapy for colon cancer. EXAM: MRI HEAD WITHOUT AND WITH CONTRAST TECHNIQUE: Multiplanar, multiecho pulse sequences of the brain and surrounding structures were obtained without and with intravenous contrast. CONTRAST:  7.22m GADAVIST GADOBUTROL 1 MMOL/ML IV SOLN COMPARISON:  MR head 03/10/2016 FINDINGS: Brain: No acute infarct, hemorrhage, or mass lesion is present. Mild periventricular and subcortical T2 hyperintensities bilaterally are stable and likely within normal limits for age. The ventricles are of normal size. No significant extraaxial fluid collection is present. Basal ganglia are within normal limits. The internal auditory canals are within normal limits. The brainstem and cerebellum are within normal limits. Vascular: Flow is present in the major intracranial arteries. Skull and upper cervical spine: The craniocervical junction is normal. Upper cervical spine is within normal limits. Marrow signal is unremarkable. Sinuses/Orbits: Bilateral mastoid effusions are present. No  obstructing nasopharyngeal lesion is present. The paranasal sinuses and mastoid air cells are otherwise clear. Right globe replacement noted. Globes and orbits are otherwise within normal limits. IMPRESSION: 1. Normal MRI appearance of the brain for age. 2. Bilateral mastoid effusions. No obstructing nasopharyngeal lesion is present. Electronically Signed   By: San Morelle M.D.   On: 06/13/2022 18:08   DG Chest 2 View  Result Date: 06/13/2022 CLINICAL DATA:  [place] cold, began having involuntary tremors in arms and legs, diffuse muscular pain, history stage IV colon cancer, COPD EXAM: CHEST - 2 VIEW COMPARISON:  05/24/2019 FINDINGS: LEFT jugular Port-A-Cath with tip projecting over RIGHT atrium. Enlargement of cardiac silhouette with slight vascular congestion. Mediastinal contours normal.  Asked Ross calcification aorta. Subsegmental atelectasis RIGHT base. Hypoinflated lungs versus previous exam with questionable retrocardiac LEFT lower lobe infiltrate or atelectasis. Upper lungs clear. No pleural effusion or pneumothorax. IMPRESSION: Question mild atelectasis versus infiltrate at LEFT lung base. Subsegmental atelectasis RIGHT base. Electronically Signed   By: Lavonia Dana M.D.   On: 06/13/2022 14:51    Microbiology: Results for orders placed or performed during the hospital encounter of 06/13/22  SARS Coronavirus 2 by RT PCR (hospital order, performed in [facility] hospital lab) *cepheid single result test* Anterior Nasal Swab     Status: None   Collection Time: 06/13/22  6:00 PM   Specimen: Anterior Nasal Swab  Result Value Ref Range Status   SARS Coronavirus 2 by RT PCR NEGATIVE NEGATIVE Final    Comment: (NOTE) SARS-CoV-2 target nucleic acids are NOT DETECTED.  The SARS-CoV-2 RNA is generally detectable in upper and lower respiratory specimens during the acute phase of infection. The lowest concentration of SARS-CoV-2 viral copies this assay can detect is 250 copies / mL. A negative result does not preclude SARS-CoV-2 infection and should not be used as the sole basis for treatment or other patient management decisions.  A negative result may occur with improper specimen collection / handling, submission of specimen other than nasopharyngeal swab, presence of viral mutation(s) within the areas targeted by this assay, and inadequate number of viral copies (<250 copies / mL). A negative result must be combined with clinical observations, patient history, and epidemiological information.  Fact Sheet for Patients:   https://www.patel.info/  Fact Sheet for Healthcare Providers: https://hall.com/  This test is not yet approved or  cleared by the [place] FDA and has been authorized for detection and/or diagnosis of SARS-CoV-2  by FDA under an Emergency Use Authorization (EUA).  This EUA will remain in effect (meaning this test can be used) for the duration of the COVID-19 declaration under Section 564(b)(1) of the Act, 21 U.S.C. section 360bbb-3(b)(1), unless the authorization is terminated or revoked sooner.  Performed at [facility], [address] [place], [place] [address]     Labs: CBC: Recent Labs  Lab 06/13/22 1417 06/14/22 0108  WBC 6.9 7.2  HGB 13.0 12.7*  HCT 38.9* 37.4*  MCV 98.2 97.4  PLT 91* 83*   Basic Metabolic Panel: Recent Labs  Lab 06/13/22 1417 06/14/22 0108  NA 141 141  K 3.8 3.9  CL 109 112*  CO2 23 22  GLUCOSE 93 93  BUN 28* 25*  CREATININE 1.48* 1.29*  CALCIUM 8.8* 8.8*   Liver Function Tests: Recent Labs  Lab 06/13/22 1417  AST 37  ALT 22  ALKPHOS 124  BILITOT 0.9  PROT 6.8  ALBUMIN 3.6   CBG: No results for input(s): "GLUCAP" in the last  168 hours.  Discharge time spent: less than 30 minutes.  Signed: Sharen Hones, MD [facility] 06/14/2022

## 2022-06-14 NOTE — Telephone Encounter (Signed)
Yes 2 weeks

## 2022-06-14 NOTE — Telephone Encounter (Signed)
Pt is currently admitted in hospital and missed appt today, He had nurse call to see what to do moving forward to treatment.Marland KitchenKJ

## 2022-06-14 NOTE — Care Management CC44 (Signed)
Condition Code 44 Documentation Completed  Patient Details  Name: LAWARENCE MEEK MRN: [id number] Date of Birth: [birth year]   Condition Code 44 given:  Yes Patient signature on Condition Code 44 notice:  Yes Documentation of 2 MD's agreement:  Yes Code 44 added to claim:  Yes    Gerrianne Scale Kendelle Schweers, LCSW 06/14/2022, 2:17 PM

## 2022-06-14 NOTE — Telephone Encounter (Signed)
VM left with patient to make him aware of appointment from today (he is inpatient at [facility]) that has been rescheduled 2 + wks per MD. Requested that he call back if this date does not work for him or if he has any questions.

## 2022-06-14 NOTE — Consult Note (Signed)
Stroke Neurology Consultation Note  Consult Requested by: Sharen Hones, MD  Reason for Consult: Tremor  Consult Date: 06/14/22    History of Present Illness:  Eric Simon is a 71 y.o. Caucasian male with PMH of see below.  He had episode of whole body tremulous activity without loss of consciousness.  Duration for about 5-10 minutes.  He had several episodes of this felt very cold started when he was at [facility].  He could not turn the air conditioning on the car as this would bring on his chills/tremulous activity.  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  This is quite different than what he used to have and those seizures resolved.  Currently he is back to baseline    Past Medical History:  Diagnosis Date   Colon cancer ([place])    COPD (chronic obstructive pulmonary disease) ([place])    Hyperlipidemia     Past Surgical History:  Procedure Laterality Date   COLONOSCOPY WITH PROPOFOL N/A 06/26/2019   Procedure: COLONOSCOPY WITH PROPOFOL;  Surgeon: [name], MD;  Location: [facility] ENDOSCOPY;  Service: Gastroenterology;  Laterality: N/A;   FLEXIBLE SIGMOIDOSCOPY N/A 05/07/2021   Procedure: FLEXIBLE SIGMOIDOSCOPY;  Surgeon: [name], MD;  Location: [facility] ENDOSCOPY;  Service: Gastroenterology;  Laterality: N/A;   PORTA CATH INSERTION N/A 06/25/2019   Procedure: PORTA CATH INSERTION;  Surgeon: Algernon Huxley, MD;  Location: [place] CV LAB;  Service: Cardiovascular;  Laterality: N/A;   PORTA CATH INSERTION Left 08/11/2020   Procedure: PORTA CATH INSERTION;  Surgeon: Algernon Huxley, MD;  Location: [place] CV LAB;  Service: Cardiovascular;  Laterality: Left;   PORTA CATH REMOVAL Right 08/11/2020   Procedure: PORTA CATH REMOVAL;  Surgeon: Algernon Huxley, MD;  Location: [place] CV LAB;  Service: Cardiovascular;  Laterality: Right;    Family History  Problem Relation Age of Onset   Brain cancer Father     Social History:  reports that he has been smoking  cigarettes. He has a 20.00 pack-year smoking history. He has never used smokeless tobacco. He reports that he does not drink alcohol and does not use drugs.  Allergies: No Known Allergies  Current Facility-Administered Medications on File Prior to Encounter  Medication Dose Route Frequency Provider Last Rate Last Admin   heparin lock flush 100 UNIT/ML injection            Current Outpatient Medications on File Prior to Encounter  Medication Sig Dispense Refill   allopurinol (ZYLOPRIM) 100 MG tablet Take 100 mg by mouth daily.     aspirin EC 81 MG tablet Take 81 mg by mouth daily.     capecitabine (XELODA) 500 MG tablet Take 1 tablet (500 mg total) by mouth 2 (two) times daily after a meal. Take for 14 days, then hold for 7 days. Repeat every 21 days. 28 tablet 0   cetirizine (ZYRTEC) 10 MG tablet Take 1 tablet (10 mg total) by mouth daily. 30 tablet 0   chlorthalidone (HYGROTON) 25 MG tablet Take 1 tablet by mouth daily.     cyclobenzaprine (FLEXERIL) 10 MG tablet Take 1 tablet (10 mg total) by mouth 3 (three) times daily as needed for muscle spasms. 42 tablet 0   DEXILANT 60 MG capsule Take 60 mg by mouth daily.     DULoxetine (CYMBALTA) 30 MG capsule Take 1 capsule (30 mg total) by mouth 2 (two) times daily. 60 capsule 1   lidocaine-prilocaine (EMLA) cream Apply 1  application  topically as needed (pt will put 1 inch of cream over port 1 hourfor each treatment, cover plastic over the cream protect clothes). 30 g 3   lisinopril (ZESTRIL) 5 MG tablet Take 5 mg by mouth daily.     montelukast (SINGULAIR) 10 MG tablet Take 10 mg by mouth at bedtime.     NEXLETOL 180 MG TABS Take 1 tablet by mouth daily.     pregabalin (LYRICA) 75 MG capsule Take 1 capsule (75 mg total) by mouth 2 (two) times daily. 60 capsule 2   simvastatin (ZOCOR) 20 MG tablet Take 20 mg by mouth daily.      SYMBICORT 80-4.5 MCG/ACT inhaler Inhale 2 puffs into the lungs daily.     tamsulosin (FLOMAX) 0.4 MG CAPS capsule Take  0.4 mg by mouth.      azelastine (ASTELIN) 0.1 % nasal spray Place 1 spray into both nostrils daily as needed.     butalbital-acetaminophen-caffeine (FIORICET) 50-325-40 MG tablet Take 1 tablet by mouth every 4 (four) hours as needed.     fluticasone (FLONASE) 50 MCG/ACT nasal spray Place 2 sprays into both nostrils daily as needed.     loperamide (IMODIUM A-D) 2 MG tablet Take 1 tablet (2 mg total) by mouth 4 (four) times daily as needed. Take 2 at diarrhea onset , then 1 every 2hr until 12hrs with no BM. May take 2 every 4hrs at night. If diarrhea recurs repeat. (Patient not taking: Reported on 05/24/2022) 100 tablet 1   magic mouthwash (multi-ingredient) oral suspension Swish and spit 5-10 mls by mouth 4 times a day as needed (Patient not taking: Reported on 04/05/2022) 480 mL 3   oxyCODONE (OXY IR/ROXICODONE) 5 MG immediate release tablet Take 1 tablet (5 mg total) by mouth every 6 (six) hours as needed for severe pain. (Patient not taking: Reported on 04/05/2022) 30 tablet 0   [DISCONTINUED] prochlorperazine (COMPAZINE) 10 MG tablet Take 1 tablet (10 mg total) by mouth every 6 (six) hours as needed (Nausea or vomiting). 30 tablet 1    Review of Systems: A full ROS was attempted today and was able to be performed.  Systems assessed include - Constitutional, Eyes, HENT, Respiratory, Cardiovascular, Gastrointestinal, Genitourinary, Integument/breast, Hematologic/lymphatic, Musculoskeletal, Neurological, Behavioral/Psych, Endocrine, Allergic/Immunologic - with pertinent responses as per HPI.  Physical Examination: Temp:  [98.1 F (36.7 C)-98.9 F (37.2 C)] 98.8 F (37.1 C) (07/03 0726) Pulse Rate:  [80-104] 84 (07/03 0726) Resp:  [18-26] 18 (07/03 0726) BP: (106-124)/(60-106) 115/64 (07/03 0726) SpO2:  [91 %-99 %] 94 % (07/03 0726) Weight:  [72.3 kg] 72.3 kg (07/02 2200)  General - well nourished, well developed, in no apparent distress   Ophthalmologic - fundi not visualized due to  noncooperation.    Cardiovascular - regular rhythm and rate  Mental Status -  Level of arousal and orientation to time, place, and person were intact. Language including expression, naming, repetition, comprehension, reading, and writing was assessed and found intact. Attention span and concentration were normal. Recent and remote memory were intact. Fund of Knowledge was assessed and was intact.  Cranial Nerves II - XII - II - Vision intact OU. III, IV, VI - Extraocular movements intact. V - Facial sensation intact bilaterally. VII - Facial movement intact bilaterally. VIII - Hearing & vestibular intact bilaterally. X - Palate elevates symmetrically. XI - Chin turning & shoulder shrug intact bilaterally. XII - Tongue protrusion intact.  Motor Strength - The patient's strength was normal in all extremities and  pronator drift was absent.   Motor Tone & Bulk - Muscle tone was assessed at the neck and appendages and was normal.  Bulk was normal and fasciculations were absent.   Reflexes - The patient's reflexes were normal in all extremities and he had no pathological reflexes.  Sensory - Light touch, temperature/pinprick were assessed and were normal.    Coordination - The patient had normal movements in the hands and feet with no ataxia or dysmetria.  Tremor was absent.  Gait and Station - deferred  Data Reviewed: MR Brain W and Wo Contrast  Result Date: 06/13/2022 CLINICAL DATA:  New onset of upper and lower extremity tremors. The patient is currently undergoing therapy for colon cancer. EXAM: MRI HEAD WITHOUT AND WITH CONTRAST TECHNIQUE: Multiplanar, multiecho pulse sequences of the brain and surrounding structures were obtained without and with intravenous contrast. CONTRAST:  7.65m GADAVIST GADOBUTROL 1 MMOL/ML IV SOLN COMPARISON:  MR head 03/10/2016 FINDINGS: Brain: No acute infarct, hemorrhage, or mass lesion is present. Mild periventricular and subcortical T2 hyperintensities  bilaterally are stable and likely within normal limits for age. The ventricles are of normal size. No significant extraaxial fluid collection is present. Basal ganglia are within normal limits. The internal auditory canals are within normal limits. The brainstem and cerebellum are within normal limits. Vascular: Flow is present in the major intracranial arteries. Skull and upper cervical spine: The craniocervical junction is normal. Upper cervical spine is within normal limits. Marrow signal is unremarkable. Sinuses/Orbits: Bilateral mastoid effusions are present. No obstructing nasopharyngeal lesion is present. The paranasal sinuses and mastoid air cells are otherwise clear. Right globe replacement noted. Globes and orbits are otherwise within normal limits. IMPRESSION: 1. Normal MRI appearance of the brain for age. 2. Bilateral mastoid effusions. No obstructing nasopharyngeal lesion is present. Electronically Signed   By: CSan MorelleM.D.   On: 06/13/2022 18:08   DG Chest 2 View  Result Date: 06/13/2022 CLINICAL DATA:  [place], began having involuntary tremors in arms and legs, diffuse muscular pain, history stage IV colon cancer, COPD EXAM: CHEST - 2 VIEW COMPARISON:  05/24/2019 FINDINGS: LEFT jugular Port-A-Cath with tip projecting over RIGHT atrium. Enlargement of cardiac silhouette with slight vascular congestion. Mediastinal contours normal. Asked Ross calcification aorta. Subsegmental atelectasis RIGHT base. Hypoinflated lungs versus previous exam with questionable retrocardiac LEFT lower lobe infiltrate or atelectasis. Upper lungs clear. No pleural effusion or pneumothorax. IMPRESSION: Question mild atelectasis versus infiltrate at LEFT lung base. Subsegmental atelectasis RIGHT base. Electronically Signed   By: MLavonia DanaM.D.   On: 06/13/2022 14:51    Assessment: 71y.o. male with history of metastatic colon cancer.  Episode of tremulous activity is not concerning for seizures.  There is  no loss of consciousness.  EEG is not available today at this facility.  MRI without contrast is negative for mets.  At this point he is back to baseline no need for additional medications.  Certainly if this continues he should see a neurology outpatient for further work-up.  Patient is ready to go home and is more concerned about getting his chemotherapy treatment.  Plan discussed with Dr. ZRoosevelt Locks  Total of 57 mins spent reviewing chart, discussion with patient and family on prognosis, Dx and plan. Discussed case with patient's nurse. Reviewed Imaging personally.    Gerlad Pelzel,MD

## 2022-06-16 ENCOUNTER — Other Ambulatory Visit (HOSPITAL_COMMUNITY): Payer: Self-pay

## 2022-06-16 ENCOUNTER — Other Ambulatory Visit: Payer: Self-pay | Admitting: Oncology

## 2022-06-16 DIAGNOSIS — C189 Malignant neoplasm of colon, unspecified: Secondary | ICD-10-CM

## 2022-06-18 ENCOUNTER — Other Ambulatory Visit (HOSPITAL_COMMUNITY): Payer: Self-pay

## 2022-06-18 ENCOUNTER — Other Ambulatory Visit: Payer: Self-pay | Admitting: Oncology

## 2022-06-18 DIAGNOSIS — C189 Malignant neoplasm of colon, unspecified: Secondary | ICD-10-CM

## 2022-06-21 ENCOUNTER — Other Ambulatory Visit: Payer: Self-pay | Admitting: Oncology

## 2022-06-21 ENCOUNTER — Other Ambulatory Visit (HOSPITAL_COMMUNITY): Payer: Self-pay

## 2022-06-21 DIAGNOSIS — C189 Malignant neoplasm of colon, unspecified: Secondary | ICD-10-CM

## 2022-06-22 ENCOUNTER — Ambulatory Visit
Admission: RE | Admit: 2022-06-22 | Discharge: 2022-06-22 | Disposition: A | Discharge status: Home / Self Care | Payer: Medicare HMO | Source: Ambulatory Visit | Attending: Oncology | Admitting: Oncology

## 2022-06-22 ENCOUNTER — Other Ambulatory Visit (HOSPITAL_COMMUNITY): Payer: Self-pay

## 2022-06-22 ENCOUNTER — Encounter: Payer: Self-pay | Admitting: Oncology

## 2022-06-22 DIAGNOSIS — C189 Malignant neoplasm of colon, unspecified: Secondary | ICD-10-CM | POA: Insufficient documentation

## 2022-06-22 DIAGNOSIS — C772 Secondary and unspecified malignant neoplasm of intra-abdominal lymph nodes: Secondary | ICD-10-CM | POA: Diagnosis not present

## 2022-06-22 DIAGNOSIS — K5730 Diverticulosis of large intestine without perforation or abscess without bleeding: Secondary | ICD-10-CM | POA: Diagnosis not present

## 2022-06-22 DIAGNOSIS — K7460 Unspecified cirrhosis of liver: Secondary | ICD-10-CM | POA: Diagnosis not present

## 2022-06-22 MED ORDER — IOHEXOL 300 MG/ML  SOLN
100.0000 mL | Freq: Once | INTRAMUSCULAR | Status: AC | PRN
  Administered 2022-06-22: 100 mL via INTRAVENOUS

## 2022-06-22 MED ORDER — CAPECITABINE 500 MG PO TABS
500.0000 mg | ORAL_TABLET | Freq: Two times a day (BID) | ORAL | 0 refills | Status: DC
  Filled 2022-06-22: qty 28, 14d supply, fill #0

## 2022-06-22 NOTE — Telephone Encounter (Signed)
CBC Order: [id number] Status: Final result    Visible to patient: Yes (seen)    Next appt: Today at 09:00 AM in Radiology ([facility])    0 Result Notes           Component Ref Range & Units 8 d ago 9 d ago 4 wk ago 1 mo ago 2 mo ago 3 mo ago 4 mo ago  WBC 4.0 - 10.5 K/uL 7.2  6.9  7.3  6.7  6.2  8.6  5.7   RBC 4.22 - 5.81 MIL/uL 3.84 Low   3.96 Low   4.09 Low   4.21 Low   3.90 Low   4.11 Low   4.13 Low    Hemoglobin 13.0 - 17.0 g/dL 12.7 Low   13.0  13.5  14.0  13.0  13.9  13.7   HCT 39.0 - 52.0 % 37.4 Low   38.9 Low   39.0  40.1  37.7 Low   39.6  40.5   MCV 80.0 - 100.0 fL 97.4  98.2  95.4  95.2  96.7  96.4  98.1   MCH 26.0 - 34.0 pg 33.1  32.8  33.0  33.3  33.3  33.8  33.2   MCHC 30.0 - 36.0 g/dL 34.0  33.4  34.6  34.9  34.5  35.1  33.8   RDW 11.5 - 15.5 % 14.8  14.8  14.6  15.2  14.9  14.6  15.6 High    Platelets 150 - 400 K/uL 83 Low   91 Low   148 Low   108 Low  CM  106 Low  CM  125 Low   123 Low  CM   Comment: Immature Platelet Fraction may be  clinically indicated, consider  ordering this additional test  [id number]   nRBC 0.0 - 0.2 % 0.0  0.0 CM  0.0  0.0  0.0  0.0  0.0   Comment: Performed at [facility], [address] [place], [place] [address]  Neutrophils Relative %    58 R  58 R  62 R  64 R  51 R   Basophils Absolute    0.1 R  0.1 R  0.0 R  0.1 R  0.1 R   Immature Granulocytes    2 R  1 R  1 R  1 R  1 R   Abs Immature Granulocytes    0.11 High  R, CM  0.06 R, CM  0.05 R, CM  0.04 R, CM  0.05 R, CM   Neutro Abs    4.3 R  3.9 R  3.9 R  5.5 R  2.9 R   Lymphocytes Relative    27 R  28 R  26 R  24 R  33 R   Lymphs Abs    1.9 R  1.9 R  1.6 R  2.1 R  1.9 R   Monocytes Relative    9 R  9 R  7 R  7 R  10 R   Monocytes Absolute    0.6 R  0.6 R  0.4 R  0.6 R  0.6 R   Eosinophils Relative    3 R  3 R  3 R  3 R  4 R   Eosinophils Absolute    0.2 R  0.2 R  0.2 R  0.3 R  0.2 R   Basophils Relative    1 R  1 R  1 R  1 R  1 R   Resulting Agency  [place] [facility] [place] [facility] [place] [facility]  [facility] [place] [facility] [place] [facility] [place] [facility] [place] [facility]         Specimen Collected: 06/14/22 01:08 Last Resulted: 06/14/22 02:21      Basic metabolic panel Order: [id number] Status: Final result    Visible to patient: Yes (seen)    Next appt: Today at 09:00 AM in Radiology ([facility])    0 Result Notes           Component Ref Range & Units 8 d ago 9 d ago 4 wk ago 1 mo ago 2 mo ago 3 mo ago 4 mo ago  Sodium 135 - 145 mmol/L 141  141  136  135  136 VC  136  134 Low    Comment: ELECTROLYTES REPEATED TO VERIFY DLB  Potassium 3.5 - 5.1 mmol/L 3.9  3.8  4.0  3.7  3.8 VC  3.6  3.8   Chloride 98 - 111 mmol/L 112 High   109  104  104  109 VC  105  106   CO2 22 - 32 mmol/L '22  23  23  22  22 '$ VC  21 Low   20 Low    Glucose, Bld 70 - 99 mg/dL 93  93 CM  107 High  CM  93 CM  102 High  CM  140 High  CM  122 High  CM   Comment: Glucose reference range applies only to samples taken after fasting for at least 8 hours.  BUN 8 - 23 mg/dL 25 High   28 High   30 High   32 High   30 High   38 High   40 High    Creatinine, Ser 0.61 - 1.24 mg/dL 1.29 High   1.48 High   1.11  1.10  0.92  1.25 High   1.20   Calcium 8.9 - 10.3 mg/dL 8.8 Low   8.8 Low   9.1  9.3  8.8 Low   8.9  8.8 Low    GFR, Estimated >60 mL/min 59 Low   50 Low  CM  >60 CM  >60 CM  >60 CM  >60 CM  >60 CM   Comment: (NOTE)  Calculated using the CKD-EPI Creatinine Equation (2021)   Anion gap 5 - '15 7  9 '$ CM  9 CM  9 CM  5 VC, CM  10 CM  8 CM   Comment: Performed at [facility], [address] [place], [place] [address]  Resulting Agency  [facility] [place] [facility] [place] [facility] [place] [facility] [place] [facility] [place] [facility] [place] [facility]         Specimen Collected: 06/14/22 01:08 Last Resulted: 06/14/22 01:46

## 2022-06-23 ENCOUNTER — Other Ambulatory Visit (HOSPITAL_COMMUNITY): Payer: Self-pay

## 2022-06-23 ENCOUNTER — Other Ambulatory Visit: Payer: Self-pay

## 2022-06-23 DIAGNOSIS — C189 Malignant neoplasm of colon, unspecified: Secondary | ICD-10-CM

## 2022-06-29 ENCOUNTER — Inpatient Hospital Stay: Payer: Medicare HMO

## 2022-06-29 ENCOUNTER — Encounter: Payer: Self-pay | Admitting: Oncology

## 2022-06-29 ENCOUNTER — Other Ambulatory Visit: Payer: Self-pay | Admitting: *Deleted

## 2022-06-29 ENCOUNTER — Inpatient Hospital Stay (HOSPITAL_BASED_OUTPATIENT_CLINIC_OR_DEPARTMENT_OTHER): Payer: Medicare HMO | Admitting: Oncology

## 2022-06-29 ENCOUNTER — Ambulatory Visit
Admission: RE | Admit: 2022-06-29 | Discharge: 2022-06-29 | Disposition: A | Discharge status: Home / Self Care | Payer: Medicare HMO | Source: Ambulatory Visit | Attending: Radiation Oncology | Admitting: Radiation Oncology

## 2022-06-29 ENCOUNTER — Inpatient Hospital Stay: Payer: Medicare HMO | Attending: Oncology

## 2022-06-29 VITALS — BP 112/68 | HR 89 | Temp 98.7°F | Resp 20 | Wt 159.0 lb

## 2022-06-29 DIAGNOSIS — E785 Hyperlipidemia, unspecified: Secondary | ICD-10-CM | POA: Insufficient documentation

## 2022-06-29 DIAGNOSIS — R197 Diarrhea, unspecified: Secondary | ICD-10-CM | POA: Insufficient documentation

## 2022-06-29 DIAGNOSIS — C189 Malignant neoplasm of colon, unspecified: Secondary | ICD-10-CM | POA: Insufficient documentation

## 2022-06-29 DIAGNOSIS — Z5112 Encounter for antineoplastic immunotherapy: Secondary | ICD-10-CM

## 2022-06-29 DIAGNOSIS — Z7189 Other specified counseling: Secondary | ICD-10-CM

## 2022-06-29 DIAGNOSIS — Z7951 Long term (current) use of inhaled steroids: Secondary | ICD-10-CM | POA: Insufficient documentation

## 2022-06-29 DIAGNOSIS — Z5111 Encounter for antineoplastic chemotherapy: Secondary | ICD-10-CM | POA: Diagnosis not present

## 2022-06-29 DIAGNOSIS — C187 Malignant neoplasm of sigmoid colon: Secondary | ICD-10-CM | POA: Insufficient documentation

## 2022-06-29 DIAGNOSIS — K5900 Constipation, unspecified: Secondary | ICD-10-CM | POA: Insufficient documentation

## 2022-06-29 DIAGNOSIS — C772 Secondary and unspecified malignant neoplasm of intra-abdominal lymph nodes: Secondary | ICD-10-CM | POA: Insufficient documentation

## 2022-06-29 DIAGNOSIS — F17210 Nicotine dependence, cigarettes, uncomplicated: Secondary | ICD-10-CM | POA: Insufficient documentation

## 2022-06-29 DIAGNOSIS — J449 Chronic obstructive pulmonary disease, unspecified: Secondary | ICD-10-CM | POA: Insufficient documentation

## 2022-06-29 DIAGNOSIS — N179 Acute kidney failure, unspecified: Secondary | ICD-10-CM

## 2022-06-29 DIAGNOSIS — Z7982 Long term (current) use of aspirin: Secondary | ICD-10-CM | POA: Insufficient documentation

## 2022-06-29 DIAGNOSIS — Z79899 Other long term (current) drug therapy: Secondary | ICD-10-CM

## 2022-06-29 DIAGNOSIS — M545 Low back pain, unspecified: Secondary | ICD-10-CM | POA: Insufficient documentation

## 2022-06-29 LAB — COMPREHENSIVE METABOLIC PANEL
ALT: 13 U/L (ref 0–44)
AST: 24 U/L (ref 15–41)
Albumin: 3.9 g/dL (ref 3.5–5.0)
Alkaline Phosphatase: 140 U/L — ABNORMAL HIGH (ref 38–126)
Anion gap: 6 (ref 5–15)
BUN: 36 mg/dL — ABNORMAL HIGH (ref 8–23)
CO2: 24 mmol/L (ref 22–32)
Calcium: 8.7 mg/dL — ABNORMAL LOW (ref 8.9–10.3)
Chloride: 106 mmol/L (ref 98–111)
Creatinine, Ser: 1.44 mg/dL — ABNORMAL HIGH (ref 0.61–1.24)
GFR, Estimated: 52 mL/min — ABNORMAL LOW (ref >60)
Glucose, Bld: 150 mg/dL — ABNORMAL HIGH (ref 70–99)
Potassium: 3.5 mmol/L (ref 3.5–5.1)
Sodium: 136 mmol/L (ref 135–145)
Total Bilirubin: 0.9 mg/dL (ref 0.3–1.2)
Total Protein: 7.2 g/dL (ref 6.5–8.1)

## 2022-06-29 LAB — CBC WITH DIFFERENTIAL/PLATELET
Abs Immature Granulocytes: 0.04 10*3/uL (ref 0.00–0.07)
Basophils Absolute: 0.1 10*3/uL (ref 0.0–0.1)
Basophils Relative: 1 %
Eosinophils Absolute: 0.2 10*3/uL (ref 0.0–0.5)
Eosinophils Relative: 2 %
HCT: 39.6 % (ref 39.0–52.0)
Hemoglobin: 13.7 g/dL (ref 13.0–17.0)
Immature Granulocytes: 0 %
Lymphocytes Relative: 21 %
Lymphs Abs: 1.9 10*3/uL (ref 0.7–4.0)
MCH: 33.3 pg (ref 26.0–34.0)
MCHC: 34.6 g/dL (ref 30.0–36.0)
MCV: 96.1 fL (ref 80.0–100.0)
Monocytes Absolute: 0.8 10*3/uL (ref 0.1–1.0)
Monocytes Relative: 9 %
Neutro Abs: 6.1 10*3/uL (ref 1.7–7.7)
Neutrophils Relative %: 67 %
Platelets: 162 10*3/uL (ref 150–400)
RBC: 4.12 MIL/uL — ABNORMAL LOW (ref 4.22–5.81)
RDW: 14.6 % (ref 11.5–15.5)
WBC: 9 10*3/uL (ref 4.0–10.5)
nRBC: 0 % (ref 0.0–0.2)

## 2022-06-29 LAB — MAGNESIUM: Magnesium: 2.1 mg/dL (ref 1.7–2.4)

## 2022-06-29 MED ORDER — HEPARIN SOD (PORK) LOCK FLUSH 100 UNIT/ML IV SOLN
INTRAVENOUS | Status: AC
  Administered 2022-06-29: 500 [IU]
  Filled 2022-06-29: qty 5

## 2022-06-29 MED ORDER — SODIUM CHLORIDE 0.9 % IV SOLN
10.0000 mg | Freq: Once | INTRAVENOUS | Status: AC
  Administered 2022-06-29: 10 mg via INTRAVENOUS
  Filled 2022-06-29: qty 1
  Filled 2022-06-29: qty 10

## 2022-06-29 MED ORDER — SODIUM CHLORIDE 0.9 % IV SOLN
6.0000 mg/kg | Freq: Once | INTRAVENOUS | Status: AC
  Administered 2022-06-29: 400 mg via INTRAVENOUS
  Filled 2022-06-29: qty 20

## 2022-06-29 MED ORDER — PALONOSETRON HCL INJECTION 0.25 MG/5ML
0.2500 mg | Freq: Once | INTRAVENOUS | Status: AC
  Administered 2022-06-29: 0.25 mg via INTRAVENOUS
  Filled 2022-06-29: qty 5

## 2022-06-29 MED ORDER — ATROPINE SULFATE 1 MG/ML IV SOLN
0.5000 mg | Freq: Once | INTRAVENOUS | Status: AC | PRN
  Administered 2022-06-29: 0.5 mg via INTRAVENOUS
  Filled 2022-06-29: qty 1

## 2022-06-29 MED ORDER — SODIUM CHLORIDE 0.9% FLUSH
10.0000 mL | INTRAVENOUS | Status: DC | PRN
  Administered 2022-06-29: 10 mL
  Filled 2022-06-29: qty 10

## 2022-06-29 MED ORDER — SODIUM CHLORIDE 0.9 % IV SOLN
Freq: Once | INTRAVENOUS | Status: AC
  Filled 2022-06-29: qty 250

## 2022-06-29 MED ORDER — HEPARIN SOD (PORK) LOCK FLUSH 100 UNIT/ML IV SOLN
500.0000 [IU] | Freq: Once | INTRAVENOUS | Status: AC | PRN
  Filled 2022-06-29: qty 5

## 2022-06-29 MED ORDER — SODIUM CHLORIDE 0.9 % IV SOLN
125.0000 mg/m2 | Freq: Once | INTRAVENOUS | Status: AC
  Administered 2022-06-29: 220 mg via INTRAVENOUS
  Filled 2022-06-29: qty 6

## 2022-06-29 NOTE — Consult Note (Signed)
NEW PATIENT EVALUATION  Name: Eric Simon  MRN: [id number]  Date:   06/29/2022     DOB: Jun 01, 1951   This 71 y.o. male patient presents to the clinic for initial evaluation of palliative treatment for retroperitoneal lymph nodes in patient with known stage IV colon cancer.  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   PREVIOUS INVESTIGATIONS:  CT scans reviewed PET CT scan ordered Pathology reports reviewed Clinical notes reviewed  HPI: Patient is a 71 year old male diagnosed back in 2020 when he presented with chest pain.  Work-up showed mediastinal and retrocrural adenopathy.  PET scan showed pathologic retroperitoneal and pelvic and thoracic adenopathy favoring lymphoma.  CT-guided biopsy of his retroperitoneal lymph nodes was positive for metastatic adenocarcinoma of colorectal origin.  Immunohistochemistry supported this diagnosis.  Colonoscopy showed a sigmoid mass consistent with adenocarcinoma.  Tumor was positive for wild-type both K-ras and BRAF positive.  He has been treated with FOLFOX and bevacizumab since his July 2020.  His recent CT scans of shown increased size of intra-abdominal adenopathy but with low-volume disease.  He has been having some increasing low back pain.  He has been recently switched to Xeloda irinotecan panitumumab  regimen.  He is now referred to radiation oncology for consideration of palliative treatment.  His bowels are oscillating between some constipation and diarrhea.  He is having no pain elsewhere.  He states the back pain is not that significant at this time.  PLANNED TREATMENT REGIMEN: Possible palliative radiation therapy to intra-abdominal lymph nodes  PAST MEDICAL HISTORY:  has a past medical history of Colon cancer ([place]), COPD (chronic  obstructive pulmonary disease) ([place]), and Hyperlipidemia.    PAST SURGICAL HISTORY:  Past Surgical History:  Procedure Laterality Date   COLONOSCOPY WITH PROPOFOL N/A 06/26/2019   Procedure: COLONOSCOPY WITH PROPOFOL;  Surgeon: [name], MD;  Location: [facility] ENDOSCOPY;  Service: Gastroenterology;  Laterality: N/A;   FLEXIBLE SIGMOIDOSCOPY N/A 05/07/2021   Procedure: FLEXIBLE SIGMOIDOSCOPY;  Surgeon: [name], MD;  Location: [facility] ENDOSCOPY;  Service: Gastroenterology;  Laterality: N/A;   PORTA CATH INSERTION N/A 06/25/2019   Procedure: PORTA CATH INSERTION;  Surgeon: Algernon Huxley, MD;  Location: [place] [facility];  Service: Cardiovascular;  Laterality: N/A;   PORTA CATH INSERTION Left 08/11/2020   Procedure: PORTA CATH INSERTION;  Surgeon: Algernon Huxley, MD;  Location: [place] [facility];  Service: Cardiovascular;  Laterality: Left;   PORTA CATH REMOVAL Right 08/11/2020   Procedure: PORTA CATH REMOVAL;  Surgeon: Algernon Huxley, MD;  Location: [place] [facility];  Service: Cardiovascular;  Laterality: Right;    FAMILY HISTORY: family history includes Brain cancer in his father.  SOCIAL HISTORY:  reports that he has been smoking cigarettes. He has a 20.00 pack-year smoking history. He has never used smokeless tobacco. He reports that he does not drink alcohol and does not use drugs.  ALLERGIES: Patient has no known allergies.  MEDICATIONS:  Current Outpatient Medications  Medication Sig Dispense Refill   allopurinol (ZYLOPRIM) 100 MG tablet Take 100 mg by mouth daily.     aspirin EC 81 MG tablet Take 81 mg by mouth daily.     azelastine (ASTELIN) 0.1 % nasal spray Place 1 spray into both nostrils daily as needed.     butalbital-acetaminophen-caffeine (FIORICET) [phone]  MG tablet Take 1 tablet by mouth every 4 (four) hours as needed.     capecitabine (XELODA) 500 MG tablet Take 1 tablet (500 mg total) by mouth 2 (two) times daily after a meal. Take for 14 days, then hold for 7 days.  Repeat every 21 days. 28 tablet 0   cetirizine (ZYRTEC) 10 MG tablet Take 1 tablet (10 mg total) by mouth daily. 30 tablet 0   chlorthalidone (HYGROTON) 25 MG tablet Take 1 tablet by mouth daily.     cyclobenzaprine (FLEXERIL) 10 MG tablet Take 1 tablet (10 mg total) by mouth 3 (three) times daily as needed for muscle spasms. 42 tablet 0   DEXILANT 60 MG capsule Take 60 mg by mouth daily.     DULoxetine (CYMBALTA) 30 MG capsule Take 1 capsule (30 mg total) by mouth 2 (two) times daily. 60 capsule 1   fluticasone (FLONASE) 50 MCG/ACT nasal spray Place 2 sprays into both nostrils daily as needed.     lidocaine-prilocaine (EMLA) cream Apply 1 application  topically as needed (pt will put 1 inch of cream over port 1 hourfor each treatment, cover plastic over the cream protect clothes). 30 g 3   lisinopril (ZESTRIL) 5 MG tablet Take 5 mg by mouth daily.     loperamide (IMODIUM A-D) 2 MG tablet Take 1 tablet (2 mg total) by mouth 4 (four) times daily as needed. Take 2 at diarrhea onset , then 1 every 2hr until 12hrs with no BM. May take 2 every 4hrs at night. If diarrhea recurs repeat. (Patient not taking: Reported on 05/24/2022) 100 tablet 1   magic mouthwash (multi-ingredient) oral suspension Swish and spit 5-10 mls by mouth 4 times a day as needed (Patient not taking: Reported on 04/05/2022) 480 mL 3   montelukast (SINGULAIR) 10 MG tablet Take 10 mg by mouth at bedtime.     NEXLETOL 180 MG TABS Take 1 tablet by mouth daily.     oxyCODONE (OXY IR/ROXICODONE) 5 MG immediate release tablet Take 1 tablet (5 mg total) by mouth every 6 (six) hours as needed for severe pain. (Patient not taking: Reported on 04/05/2022) 30 tablet 0   pregabalin (LYRICA) 75 MG capsule Take 1 capsule (75 mg total) by mouth 2 (two) times daily. 60 capsule 2   simvastatin (ZOCOR) 20 MG tablet Take 20 mg by mouth daily.      SYMBICORT 80-4.5 MCG/ACT inhaler Inhale 2 puffs into the lungs daily.     tamsulosin (FLOMAX) 0.4 MG CAPS  capsule Take 0.4 mg by mouth.      No current facility-administered medications for this encounter.   Facility-Administered Medications Ordered in Other Encounters  Medication Dose Route Frequency Provider Last Rate Last Admin   heparin lock flush 100 UNIT/ML injection            sodium chloride flush (NS) 0.9 % injection 10 mL  10 mL Intracatheter PRN Sindy Guadeloupe, MD   10 mL at 06/29/22 1322    ECOG PERFORMANCE STATUS:  1 - Symptomatic but completely ambulatory  REVIEW OF SYSTEMS: Patient denies any weight loss, fatigue, weakness, fever, chills or night sweats. Patient denies any loss of vision, blurred vision. Patient denies any ringing  of the ears or hearing loss. No irregular heartbeat. Patient denies heart murmur or history of fainting. Patient denies any chest pain or pain radiating to her upper extremities. Patient denies any shortness of breath, difficulty breathing at night, cough or hemoptysis. Patient denies any  swelling in the lower legs. Patient denies any nausea vomiting, vomiting of blood, or coffee ground material in the vomitus. Patient denies any stomach pain. Patient states has had normal bowel movements no significant constipation or diarrhea. Patient denies any dysuria, hematuria or significant nocturia. Patient denies any problems walking, swelling in the joints or loss of balance. Patient denies any skin changes, loss of hair or loss of weight. Patient denies any excessive worrying or anxiety or significant depression. Patient denies any problems with insomnia. Patient denies excessive thirst, polyuria, polydipsia. Patient denies any swollen glands, patient denies easy bruising or easy bleeding. Patient denies any recent infections, allergies or URI. Patient "s visual fields have not changed significantly in recent time.   PHYSICAL EXAM: There were no vitals taken for this visit.  Motor or sensory and DTR levels are equal and symmetric in the lower  extremities. Well-developed well-nourished patient in NAD. HEENT reveals PERLA, EOMI, discs not visualized.  Oral cavity is clear. No oral mucosal lesions are identified. Neck is clear without evidence of cervical or supraclavicular adenopathy. Lungs are clear to A&P. Cardiac examination is essentially unremarkable with regular rate and rhythm without murmur rub or thrill. Abdomen is benign with no organomegaly or masses noted. Motor sensory and DTR levels are equal and symmetric in the upper and lower extremities. Cranial nerves II through XII are grossly intact. Proprioception is intact. No peripheral adenopathy or edema is identified. No motor or sensory levels are noted. Crude visual fields are within normal range.  LABORATORY DATA: Labs and pathology reports reviewed    RADIOLOGY RESULTS: CT scan reviewed PET CT scan ordered   IMPRESSION: Progressive retroperitoneal adenopathy and patient with known stage IV colon cancer currently on second line therapy in 70 year old male  PLAN: This time of ordered a PET CT scan for better clarification exactly what we are dealing with his terms of progressive disease.  This will also better able me to target lesions with some palliative radiation therapy.  Would plan on a short course of radiation therapy approximately 40-45 Gray over 4 to 5 weeks.  We will make further recommendations once I review his PET CT scan.  I have set the patient up for simulation after his PET scan but we will discuss all his results prior to initiating treatment.  Patient comprehends my recommendations well.  I would like to take this opportunity to thank you for allowing me to participate in the care of your patient.Noreene Filbert, MD

## 2022-06-29 NOTE — Progress Notes (Signed)
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 He currently smokes 0.5ppd. he presented to the ER with symptoms of left sided chest pain and left arm pain. Troponin was negative ekg was unremarkable. Ct chest showed no PE. He was incidentally noted to have mediastinal and retrocrural adenopathy and a 2.1X2.3X4.4 cm retroaortic soft tissue lesion in the posterior left chest. 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  Low-grade activity in the left lateral fifth and sixth ribs associated with nondisplaced fractures likely reflecting healing response problem malignancy.   Patient underwent CT-guided biopsy of the retroperitoneal lymph node pathology showed metastatic adenocarcinoma compatible with colorectal origin.  CK7 negative.  CK20 positive.  CDX 2+.  TTF-1 negative.  PSA negative.  This pattern of immunoreactivity supports the above diagnosis. Patient underwent colonoscopy which showed sigmoid mass that was consistent with adenocarcinoma.  RAS panel testing showed that he was wild-type for both K-ras and BRAF   FOLFOX and bevacizumab chemotherapy started in July 2020.  Subsequently oxaliplatin has been on hold given neuropathy.   Recent scans have shown slow increase in the size of intra-abdominal adenopathy but patient continues to have low-volume disease.plan is to switch him to second line Xeloda irinotecan panitumumab  regimen    Interval history-tolerating chemotherapy well so far.  Denies any significant side effects  ECOG PS- 1 Pain scale- 0   Review of systems- Review of Systems  Constitutional:  Negative for chills, fever, malaise/fatigue and weight loss.  HENT:  Negative for congestion, ear discharge and nosebleeds.   Eyes:  Negative for blurred vision.  Respiratory:  Negative for cough, hemoptysis, sputum production, shortness of breath and wheezing.   Cardiovascular:  Negative for chest pain, palpitations, orthopnea and claudication.  Gastrointestinal:  Negative for abdominal pain, blood in stool, constipation, diarrhea, heartburn, melena, nausea and vomiting.  Genitourinary:  Negative for dysuria, flank pain, frequency, hematuria and urgency.  Musculoskeletal:  Negative for back pain, joint pain and myalgias.  Skin:  Negative for rash.  Neurological:  Negative for dizziness, tingling, focal weakness, seizures, weakness and headaches.  Endo/Heme/Allergies:  Does not bruise/bleed easily.  Psychiatric/Behavioral:  Negative for depression and suicidal ideas. The patient does not have insomnia.       No Known Allergies   Past Medical History:  Diagnosis Date   Colon cancer ([place])    COPD (chronic obstructive pulmonary disease) ([place])    Hyperlipidemia      Past Surgical History:  Procedure Laterality Date   COLONOSCOPY WITH PROPOFOL N/A 06/26/2019   Procedure: COLONOSCOPY WITH PROPOFOL;  Surgeon: [name], MD;  Location: [facility] ENDOSCOPY;  Service: Gastroenterology;  Laterality: N/A;   FLEXIBLE SIGMOIDOSCOPY N/A 05/07/2021   Procedure: FLEXIBLE SIGMOIDOSCOPY;  Surgeon: [name], MD;  Location: [facility] ENDOSCOPY;  Service: Gastroenterology;  Laterality: N/A;  PORTA CATH INSERTION N/A 06/25/2019    Procedure: PORTA CATH INSERTION;  Surgeon: Algernon Huxley, MD;  Location: [place] CV LAB;  Service: Cardiovascular;  Laterality: N/A;   PORTA CATH INSERTION Left 08/11/2020   Procedure: PORTA CATH INSERTION;  Surgeon: Algernon Huxley, MD;  Location: [place];  Service: Cardiovascular;  Laterality: Left;   PORTA CATH REMOVAL Right 08/11/2020   Procedure: PORTA CATH REMOVAL;  Surgeon: Algernon Huxley, MD;  Location: [place] [facility];  Service: Cardiovascular;  Laterality: Right;    Social History   Socioeconomic History   Marital status: Single    Spouse name: Not on file   Number of children: Not on file   Years of education: Not on file   Highest education level: Not on file  Occupational History   Not on file  Tobacco Use   Smoking status: Every Day    Packs/day: 0.50    Years: 40.00    Total pack years: 20.00    Types: Cigarettes   Smokeless tobacco: Never   Tobacco comments:    smoking 40 years  Vaping Use   Vaping Use: Never used  Substance and Sexual Activity   Alcohol use: No   Drug use: No   Sexual activity: Not Currently  Other Topics Concern   Not on file  Social History Narrative   Not on file   Social Determinants of Health   Financial Resource Strain: Low Risk  (06/12/2019)   Overall Financial Resource Strain (CARDIA)    Difficulty of Paying Living Expenses: Not hard at all  Food Insecurity: No Food Insecurity (06/12/2019)   Hunger Vital Sign    Worried About Running Out of Food in the Last Year: Never true    Ran Out of Food in the Last Year: Never true  Transportation Needs: No Transportation Needs (06/12/2019)   PRAPARE - Hydrologist (Medical): No    Lack of Transportation (Non-Medical): No  Physical Activity: Not on file  Stress: No Stress Concern Present (06/12/2019)   [place]    Feeling of Stress : Not at all  Social Connections: Unknown  (06/12/2019)   Social Connection and Isolation Panel [NHANES]    Frequency of Communication with Friends and Family: More than three times a week    Frequency of Social Gatherings with Friends and Family: Not on file    Attends Religious Services: Not on file    Active Member of [place] or Organizations: Not on file    Attends Archivist Meetings: Not on file    Marital Status: Not on file  Intimate Partner Violence: Not At Risk (06/12/2019)   Humiliation, Afraid, Rape, and Kick questionnaire    Fear of Current or Ex-Partner: No    Emotionally Abused: No    Physically Abused: No    Sexually Abused: No    Family History  Problem Relation Age of Onset   Brain cancer Father      Current Outpatient Medications:    allopurinol (ZYLOPRIM) 100 MG tablet, Take 100 mg by mouth daily., Disp: , Rfl:    aspirin EC 81 MG tablet, Take 81 mg by mouth daily., Disp: , Rfl:    azelastine (ASTELIN) 0.1 % nasal spray, Place 1 spray into both nostrils daily as needed., Disp: , Rfl:    butalbital-acetaminophen-caffeine (FIORICET) 50-325-40 MG tablet, Take 1 tablet by mouth every 4 (four) hours as  needed., Disp: , Rfl:    capecitabine (XELODA) 500 MG tablet, Take 1 tablet (500 mg total) by mouth 2 (two) times daily after a meal. Take for 14 days, then hold for 7 days. Repeat every 21 days., Disp: 28 tablet, Rfl: 0   cetirizine (ZYRTEC) 10 MG tablet, Take 1 tablet (10 mg total) by mouth daily., Disp: 30 tablet, Rfl: 0   chlorthalidone (HYGROTON) 25 MG tablet, Take 1 tablet by mouth daily., Disp: , Rfl:    cyclobenzaprine (FLEXERIL) 10 MG tablet, Take 1 tablet (10 mg total) by mouth 3 (three) times daily as needed for muscle spasms., Disp: 42 tablet, Rfl: 0   DEXILANT 60 MG capsule, Take 60 mg by mouth daily., Disp: , Rfl:    DULoxetine (CYMBALTA) 30 MG capsule, Take 1 capsule (30 mg total) by mouth 2 (two) times daily., Disp: 60 capsule, Rfl: 1   fluticasone (FLONASE) 50 MCG/ACT nasal spray, Place 2  sprays into both nostrils daily as needed., Disp: , Rfl:    lidocaine-prilocaine (EMLA) cream, Apply 1 application  topically as needed (pt will put 1 inch of cream over port 1 hourfor each treatment, cover plastic over the cream protect clothes)., Disp: 30 g, Rfl: 3   lisinopril (ZESTRIL) 5 MG tablet, Take 5 mg by mouth daily., Disp: , Rfl:    montelukast (SINGULAIR) 10 MG tablet, Take 10 mg by mouth at bedtime., Disp: , Rfl:    NEXLETOL 180 MG TABS, Take 1 tablet by mouth daily., Disp: , Rfl:    pregabalin (LYRICA) 75 MG capsule, Take 1 capsule (75 mg total) by mouth 2 (two) times daily., Disp: 60 capsule, Rfl: 2   simvastatin (ZOCOR) 20 MG tablet, Take 20 mg by mouth daily. , Disp: , Rfl:    SYMBICORT 80-4.5 MCG/ACT inhaler, Inhale 2 puffs into the lungs daily., Disp: , Rfl:    tamsulosin (FLOMAX) 0.4 MG CAPS capsule, Take 0.4 mg by mouth. , Disp: , Rfl:    loperamide (IMODIUM A-D) 2 MG tablet, Take 1 tablet (2 mg total) by mouth 4 (four) times daily as needed. Take 2 at diarrhea onset , then 1 every 2hr until 12hrs with no BM. May take 2 every 4hrs at night. If diarrhea recurs repeat. (Patient not taking: Reported on 05/24/2022), Disp: 100 tablet, Rfl: 1   magic mouthwash (multi-ingredient) oral suspension, Swish and spit 5-10 mls by mouth 4 times a day as needed (Patient not taking: Reported on 04/05/2022), Disp: 480 mL, Rfl: 3   oxyCODONE (OXY IR/ROXICODONE) 5 MG immediate release tablet, Take 1 tablet (5 mg total) by mouth every 6 (six) hours as needed for severe pain. (Patient not taking: Reported on 04/05/2022), Disp: 30 tablet, Rfl: 0 No current facility-administered medications for this visit.  Facility-Administered Medications Ordered in Other Visits:    heparin lock flush 100 UNIT/ML injection, , , ,    heparin lock flush 100 UNIT/ML injection, , , ,    heparin lock flush 100 unit/mL, 500 Units, Intracatheter, Once PRN, Sindy Guadeloupe, MD   irinotecan (CAMPTOSAR) 220 mg in sodium chloride  0.9 % 500 mL chemo infusion, 125 mg/m2 (Treatment Plan Recorded), Intravenous, Once, Sindy Guadeloupe, MD, Last Rate: 341 mL/hr at 06/29/22 1128, 220 mg at 06/29/22 1128   sodium chloride flush (NS) 0.9 % injection 10 mL, 10 mL, Intracatheter, PRN, Sindy Guadeloupe, MD  Physical exam:  Vitals:   06/29/22 0847  BP: 112/68  Pulse: 89  Resp: 20  Temp:  98.7 F (37.1 C)  SpO2: 95%  Weight: 159 lb (72.1 kg)   Physical Exam Constitutional:      General: He is not in acute distress. Cardiovascular:     Rate and Rhythm: Normal rate and regular rhythm.     Heart sounds: Normal heart sounds.  Pulmonary:     Effort: Pulmonary effort is normal.     Breath sounds: Normal breath sounds.  Abdominal:     General: Bowel sounds are normal.     Palpations: Abdomen is soft.  Skin:    General: Skin is warm and dry.  Neurological:     Mental Status: He is alert and oriented to person, place, and time.         Latest Ref Rng & Units 06/29/2022    8:13 AM  CMP  Glucose 70 - 99 mg/dL 150   BUN 8 - 23 mg/dL 36   Creatinine 0.61 - 1.24 mg/dL 1.44   Sodium 135 - 145 mmol/L 136   Potassium 3.5 - 5.1 mmol/L 3.5   Chloride 98 - 111 mmol/L 106   CO2 22 - 32 mmol/L 24   Calcium 8.9 - 10.3 mg/dL 8.7   Total Protein 6.5 - 8.1 g/dL 7.2   Total Bilirubin 0.3 - 1.2 mg/dL 0.9   Alkaline Phos 38 - 126 U/L 140   AST 15 - 41 U/L 24   ALT 0 - 44 U/L 13       Latest Ref Rng & Units 06/29/2022    8:13 AM  CBC  WBC 4.0 - 10.5 K/uL 9.0   Hemoglobin 13.0 - 17.0 g/dL 13.7   Hematocrit 39.0 - 52.0 % 39.6   Platelets 150 - 400 K/uL 162     No images are attached to the encounter.  CT ABDOMEN PELVIS W CONTRAST  Result Date: 06/23/2022 CLINICAL DATA:  History of colon cancer, restaging. * Tracking Code: BO * . EXAM: CT ABDOMEN AND PELVIS WITH CONTRAST TECHNIQUE: Multidetector CT imaging of the abdomen and pelvis was performed using the standard protocol following bolus administration of intravenous contrast.  RADIATION DOSE REDUCTION: This exam was performed according to the departmental dose-optimization program which includes automated exposure control, adjustment of the mA and/or kV according to patient size and/or use of iterative reconstruction technique. CONTRAST:  134m OMNIPAQUE IOHEXOL 300 MG/ML  SOLN COMPARISON:  Priors including most recent CT March 08, 2022 FINDINGS: Lower chest: Centrilobular emphysema.  Bibasilar atelectasis. Hepatobiliary: No suspicious hepatic lesion. Similar hepatic contour nodularity with widening of the fissures. Gallbladder is decompressed. No biliary ductal dilation. Pancreas: No pancreatic ductal dilation or evidence of acute inflammation. Spleen: Similar mild splenomegaly measuring 15.2 cm in maximum axial dimension. Adrenals/Urinary Tract: Bilateral adrenal glands appear normal. No hydronephrosis. Kidneys demonstrate symmetric enhancement and excretion of contrast material. No suspicious renal mass. Urinary bladder is unremarkable for degree of distension. Stomach/Bowel: Radiopaque enteric contrast material traverses the descending colon. Stomach is unremarkable for degree of distension. No pathologic dilation of large or small bowel. The appendix and terminal ileum appear normal. Colonic diverticulosis without findings of acute diverticulitis. No suspicious colonic wall thickening or masslike lesion identified. Moderate volume of formed stool throughout the colon. Vascular/Lymphatic: Aortic and branch vessel atherosclerosis without abdominal aortic aneurysm. No significant interval change in the abdominal retroperitoneal adenopathy, with increased left iliac side chain adenopathy. Previously index lymph nodes are as follows: -aortocaval lymph node measures 5 mm in short axis on image 32/2, unchanged. -left external iliac lymph node  measures 19 mm in short axis on image 77/2 previously 8 mm. A dominant left external iliac lymph node measures 2.9 cm in short axis on image 71/2  previously 2 cm. Reproductive: Mild prostatomegaly. Other: Mild nodular peritoneal thickening along the left pericolic gutter which extends into the pelvis is similar over multiple prior studies. No significant abdominopelvic free fluid. Small fat containing ventral hernia. Musculoskeletal: No aggressive lytic or blastic lesion of bone. Multilevel degenerative changes spine. 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 IMPRESSION: 1. Continued increase in the left iliac side chain adenopathy concerning for worsening nodal metastatic disease. 2. Similar prominent retroperitoneal lymph nodes are nonspecific, attention on follow-up imaging suggested. 3. Mild nodular peritoneal thickening along the left pericolic gutter extending into the pelvis is similar dating back to at least August 27, 2020 and likely reflects posttreatment change. 4. Hepatic morphologic changes suggestive of mild cirrhosis are similar prior. 5. Mild splenomegaly. 6. Colonic diverticulosis without findings of acute diverticulitis. 7. Aortic Atherosclerosis (ICD10-I70.0) and Emphysema (ICD10-J43.9). Electronically Signed   By: Dahlia Bailiff M.D.   On: 06/23/2022 10:09   MR Brain W and Wo Contrast  Result Date: 06/13/2022 CLINICAL DATA:  New onset of upper and lower extremity tremors. The patient is currently undergoing therapy for colon cancer. EXAM: MRI HEAD WITHOUT AND WITH CONTRAST TECHNIQUE: Multiplanar, multiecho pulse sequences of the brain and surrounding structures were obtained without and with intravenous contrast. CONTRAST:  7.22m GADAVIST GADOBUTROL 1 MMOL/ML IV SOLN COMPARISON:  MR head 03/10/2016 FINDINGS: Brain: No acute infarct, hemorrhage, or mass lesion is present. Mild periventricular and subcortical T2 hyperintensities bilaterally are stable and likely within normal limits for age. The ventricles are of normal size. No significant extraaxial fluid collection is present. Basal ganglia are within  normal limits. The internal auditory canals are within normal limits. The brainstem and cerebellum are within normal limits. Vascular: Flow is present in the major intracranial arteries. Skull and upper cervical spine: The craniocervical junction is normal. Upper cervical spine is within normal limits. Marrow signal is unremarkable. Sinuses/Orbits: Bilateral mastoid effusions are present. No obstructing nasopharyngeal lesion is present. The paranasal sinuses and mastoid air cells are otherwise clear. Right globe replacement noted. Globes and orbits are otherwise within normal limits. IMPRESSION: 1. Normal MRI appearance of the brain for age. 2. Bilateral mastoid effusions. No obstructing nasopharyngeal lesion is present. Electronically Signed   By: CSan MorelleM.D.   On: 06/13/2022 18:08   DG Chest 2 View  Result Date: 06/13/2022 CLINICAL DATA:  [place], began having involuntary tremors in arms and legs, diffuse muscular pain, history stage IV colon cancer, COPD EXAM: CHEST - 2 VIEW COMPARISON:  05/24/2019 FINDINGS: LEFT jugular Port-A-Cath with tip projecting over RIGHT atrium. Enlargement of cardiac silhouette with slight vascular congestion. Mediastinal contours normal. Asked Ross calcification aorta. Subsegmental atelectasis RIGHT base. Hypoinflated lungs versus previous exam with questionable retrocardiac LEFT lower lobe infiltrate or atelectasis. Upper lungs clear. No pleural effusion or pneumothorax. IMPRESSION: Question mild atelectasis versus infiltrate at LEFT lung base. Subsegmental atelectasis RIGHT base. Electronically Signed   By: MLavonia DanaM.D.   On: 06/13/2022 14:51     Assessment and plan- Patient is a 71y.o. male with metastatic colon cancer and intra-abdominal metastases stage IV.  He is here for on treatment assessment prior to cycle 15 of palliative panitumumab irinotecan chemotherapy  Counts okay to proceed with cycle 14 of panitumumab irinotecan chemotherapy today.I have  reviewed CT abdomen pelvis images independently  and discussed findings with the patient which shows gradual increase in the size of his intra-abdominal adenopathyBut mainly involving the left external iliac lymph node region.  Overall he has low-volume disease.  I am therefore referring him to radiation oncology to see if he can get palliative radiation treatment to that area.  I would like him to hold off on getting panitumumab and irinotecan while he is getting radiation treatmentHe will continue Xeloda however.  I will see him back in 6 weeks to restart cycle 15 of treatment.  Patient verbalized understanding of the plan   Visit Diagnosis 1. Encounter for antineoplastic chemotherapy   2. High risk medication use   3. Encounter for monoclonal antibody treatment for malignancy   4. Goals of care, counseling/discussion   5. 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 ([place])      Dr. Randa Evens, MD, MPH [facility] [phone] 06/29/2022 12:11 PM

## 2022-06-29 NOTE — Patient Instructions (Signed)
[facility] [place]  Discharge Instructions: Thank you for choosing [facility] to provide your oncology and hematology care.  If you have a lab appointment with the [facility], please go directly to the [facility] and check in at the registration area.  Wear comfortable clothing and clothing appropriate for easy access to any Portacath or PICC line.   We strive to give you quality time with your provider. You may need to reschedule your appointment if you arrive late (15 or more minutes).  Arriving late affects you and other patients whose appointments are after yours.  Also, if you miss three or more appointments without notifying the office, you may be dismissed from the clinic at the provider's discretion.      For prescription refill requests, have your pharmacy contact our office and allow 72 hours for refills to be completed.    Today you received the following chemotherapy and/or immunotherapy agents vectibix, irinotecan      To help prevent nausea and vomiting after your treatment, we encourage you to take your nausea medication as directed.  BELOW ARE SYMPTOMS THAT SHOULD BE REPORTED IMMEDIATELY: *FEVER GREATER THAN 100.4 F (38 C) OR HIGHER *CHILLS OR SWEATING *NAUSEA AND VOMITING THAT IS NOT CONTROLLED WITH YOUR NAUSEA MEDICATION *UNUSUAL SHORTNESS OF BREATH *UNUSUAL BRUISING OR BLEEDING *URINARY PROBLEMS (pain or burning when urinating, or frequent urination) *BOWEL PROBLEMS (unusual diarrhea, constipation, pain near the anus) TENDERNESS IN MOUTH AND THROAT WITH OR WITHOUT PRESENCE OF ULCERS (sore throat, sores in mouth, or a toothache) UNUSUAL RASH, SWELLING OR PAIN  UNUSUAL VAGINAL DISCHARGE OR ITCHING   Items with * indicate a potential emergency and should be followed up as soon as possible or go to the Emergency Department if any problems should occur.  Please show the CHEMOTHERAPY ALERT CARD or IMMUNOTHERAPY ALERT CARD at  check-in to the Emergency Department and triage nurse.  Should you have questions after your visit or need to cancel or reschedule your appointment, please contact [facility] [place] [facility] [place]  [phone] and follow the prompts.  Office hours are 8:00 a.m. to 4:30 p.m. Monday - Friday. Please note that voicemails left after 4:00 p.m. may not be returned until the following business day.  We are closed weekends and major holidays. You have access to a nurse at all times for urgent questions. Please call the main number to the clinic [phone] and follow the prompts.  For any non-urgent questions, you may also contact your provider using MyChart. We now offer e-Visits for anyone 63 and older to request care online for non-urgent symptoms. For details visit [email or web address].   Also download the MyChart app! Go to the app store, search "MyChart", open the app, select [facility], and log in with your MyChart username and password.  Masks are optional in the cancer centers. If you would like for your care team to wear a mask while they are taking care of you, please let them know. For doctor visits, patients may have with them one support person who is at least 72 years old. At this time, visitors are not allowed in the infusion area.  Panitumumab Solution for Injection What is this medication? PANITUMUMAB (pan i TOOM ue mab) is a monoclonal antibody. It is used to treat colorectal cancer. This medicine may be used for other purposes; ask your health care provider or pharmacist if you have questions. COMMON BRAND NAME(S): Vectibix What should I tell my care team  before I take this medication? They need to know if you have any of these conditions: eye disease, vision problems low levels of calcium, magnesium, or potassium in the blood lung or breathing disease, like asthma skin conditions or sensitivity an unusual or allergic reaction to panitumumab, other medicines, foods,  dyes, or preservatives pregnant or trying to get pregnant breast-feeding How should I use this medication? This drug is given as an infusion into a vein. It is administered in a hospital or clinic by a specially trained health care professional. Talk to your pediatrician regarding the use of this medicine in children. Special care may be needed. Overdosage: If you think you have taken too much of this medicine contact a poison control center or emergency room at once. NOTE: This medicine is only for you. Do not share this medicine with others. What if I miss a dose? It is important not to miss your dose. Call your doctor or health care professional if you are unable to keep an appointment. What may interact with this medication? Do not take this medicine with any of the following medications: bevacizumab This list may not describe all possible interactions. Give your health care provider a list of all the medicines, herbs, non-prescription drugs, or dietary supplements you use. Also tell them if you smoke, drink alcohol, or use illegal drugs. Some items may interact with your medicine. What should I watch for while using this medication? Visit your doctor for checks on your progress. This drug may make you feel generally unwell. This is not uncommon, as chemotherapy can affect healthy cells as well as cancer cells. Report any side effects. Continue your course of treatment even though you feel ill unless your doctor tells you to stop. This medicine can make you more sensitive to the sun. Keep out of the sun while receiving this medicine and for 2 months after the last dose. If you cannot avoid being in the sun, wear protective clothing and use sunscreen. Do not use sun lamps or tanning beds/booths. In some cases, you may be given additional medicines to help with side effects. Follow all directions for their use. Call your doctor or health care professional for advice if you get a fever, chills or  sore throat, or other symptoms of a cold or flu. Do not treat yourself. This drug decreases your body's ability to fight infections. Try to avoid being around people who are sick. Avoid taking products that contain aspirin, acetaminophen, ibuprofen, naproxen, or ketoprofen unless instructed by your doctor. These medicines may hide a fever. Do not become pregnant while taking this medicine and for 2 months after the last dose. Women should inform their doctor if they wish to become pregnant or think they might be pregnant. There is a potential for serious side effects to an unborn child. Talk to your health care professional or pharmacist for more information. Do not breast-feed an infant while taking this medicine or for 2 months after the last dose. What side effects may I notice from receiving this medication? Side effects that you should report to your doctor or health care professional as soon as possible: allergic reactions like skin rash, itching or hives, swelling of the face, lips, or tongue breathing problems changes in vision eye pain fast, irregular heartbeat fever, chills mouth sores red spots on the skin redness, blistering, peeling or loosening of the skin, including inside the mouth signs and symptoms of kidney injury like trouble passing urine or change in the amount  of urine signs and symptoms of low blood pressure like dizziness; feeling faint or lightheaded, falls; unusually weak or tired signs of low calcium like fast heartbeat, muscle cramps or muscle pain; pain, tingling, numbness in the hands or feet; seizures signs and symptoms of low magnesium like muscle cramps, pain, or weakness; tremors; seizures; or fast, irregular heartbeat signs and symptoms of low potassium like muscle cramps or muscle pain; chest pain; dizziness; feeling faint or lightheaded, falls; palpitations; breathing problems; or fast, irregular heartbeat swelling of the ankles, feet, hands Side effects that  usually do not require medical attention (report to your doctor or health care professional if they continue or are bothersome): changes in skin like acne, cracks, skin dryness diarrhea eyelash growth headache mouth sores nail changes nausea, vomiting This list may not describe all possible side effects. Call your doctor for medical advice about side effects. You may report side effects to FDA at 1-800-FDA-1088. Where should I keep my medication? This drug is given in a hospital or clinic and will not be stored at home. NOTE: This sheet is a summary. It may not cover all possible information. If you have questions about this medicine, talk to your doctor, pharmacist, or health care provider.  2023 Elsevier/Gold Standard (2016-06-25 00:00:00)  Irinotecan injection What is this medication? IRINOTECAN (ir in oh TEE kan ) is a chemotherapy drug. It is used to treat colon and rectal cancer. This medicine may be used for other purposes; ask your health care provider or pharmacist if you have questions. COMMON BRAND NAME(S): Camptosar What should I tell my care team before I take this medication? They need to know if you have any of these conditions: dehydration diarrhea infection (especially a virus infection such as chickenpox, cold sores, or herpes) liver disease low blood counts, like low white cell, platelet, or red cell counts low levels of calcium, magnesium, or potassium in the blood recent or ongoing radiation therapy an unusual or allergic reaction to irinotecan, other medicines, foods, dyes, or preservatives pregnant or trying to get pregnant breast-feeding How should I use this medication? This drug is given as an infusion into a vein. It is administered in a hospital or clinic by a specially trained health care professional. Talk to your pediatrician regarding the use of this medicine in children. Special care may be needed. Overdosage: If you think you have taken too much of  this medicine contact a poison control center or emergency room at once. NOTE: This medicine is only for you. Do not share this medicine with others. What if I miss a dose? It is important not to miss your dose. Call your doctor or health care professional if you are unable to keep an appointment. What may interact with this medication? Do not take this medicine with any of the following medications: cobicistat itraconazole This medicine may interact with the following medications: antiviral medicines for HIV or AIDS certain antibiotics like rifampin or rifabutin certain medicines for fungal infections like ketoconazole, posaconazole, and voriconazole certain medicines for seizures like carbamazepine, phenobarbital, phenotoin clarithromycin gemfibrozil nefazodone St. John's Wort This list may not describe all possible interactions. Give your health care provider a list of all the medicines, herbs, non-prescription drugs, or dietary supplements you use. Also tell them if you smoke, drink alcohol, or use illegal drugs. Some items may interact with your medicine. What should I watch for while using this medication? Your condition will be monitored carefully while you are receiving this medicine. You will  need important blood work done while you are taking this medicine. This drug may make you feel generally unwell. This is not uncommon, as chemotherapy can affect healthy cells as well as cancer cells. Report any side effects. Continue your course of treatment even though you feel ill unless your doctor tells you to stop. In some cases, you may be given additional medicines to help with side effects. Follow all directions for their use. You may get drowsy or dizzy. Do not drive, use machinery, or do anything that needs mental alertness until you know how this medicine affects you. Do not stand or sit up quickly, especially if you are an older patient. This reduces the risk of dizzy or fainting  spells. Call your health care professional for advice if you get a fever, chills, or sore throat, or other symptoms of a cold or flu. Do not treat yourself. This medicine decreases your body's ability to fight infections. Try to avoid being around people who are sick. Avoid taking products that contain aspirin, acetaminophen, ibuprofen, naproxen, or ketoprofen unless instructed by your doctor. These medicines may hide a fever. This medicine may increase your risk to bruise or bleed. Call your doctor or health care professional if you notice any unusual bleeding. Be careful brushing and flossing your teeth or using a toothpick because you may get an infection or bleed more easily. If you have any dental work done, tell your dentist you are receiving this medicine. Do not become pregnant while taking this medicine or for 6 months after stopping it. Women should inform their health care professional if they wish to become pregnant or think they might be pregnant. Men should not father a child while taking this medicine and for 3 months after stopping it. There is potential for serious side effects to an unborn child. Talk to your health care professional for more information. Do not breast-feed an infant while taking this medicine or for 7 days after stopping it. This medicine has caused ovarian failure in some women. This medicine may make it more difficult to get pregnant. Talk to your health care professional if you are concerned about your fertility. This medicine has caused decreased sperm counts in some men. This may make it more difficult to father a child. Talk to your health care professional if you are concerned about your fertility. What side effects may I notice from receiving this medication? Side effects that you should report to your doctor or health care professional as soon as possible: allergic reactions like skin rash, itching or hives, swelling of the face, lips, or tongue chest  pain diarrhea flushing, runny nose, sweating during infusion low blood counts - this medicine may decrease the number of white blood cells, red blood cells and platelets. You may be at increased risk for infections and bleeding. nausea, vomiting pain, swelling, warmth in the leg signs of decreased platelets or bleeding - bruising, pinpoint red spots on the skin, black, tarry stools, blood in the urine signs of infection - fever or chills, cough, sore throat, pain or difficulty passing urine signs of decreased red blood cells - unusually weak or tired, fainting spells, lightheadedness Side effects that usually do not require medical attention (report to your doctor or health care professional if they continue or are bothersome): constipation hair loss headache loss of appetite mouth sores stomach pain This list may not describe all possible side effects. Call your doctor for medical advice about side effects. You may report side effects  to FDA at 1-800-FDA-1088. Where should I keep my medication? This drug is given in a hospital or clinic and will not be stored at home. NOTE: This sheet is a summary. It may not cover all possible information. If you have questions about this medicine, talk to your doctor, pharmacist, or health care provider.  2023 Elsevier/Gold Standard (2021-10-30 00:00:00)

## 2022-06-30 LAB — CEA: CEA: 13 ng/mL — ABNORMAL HIGH (ref 0.0–4.7)

## 2022-07-05 ENCOUNTER — Other Ambulatory Visit: Payer: Self-pay | Admitting: Oncology

## 2022-07-05 ENCOUNTER — Other Ambulatory Visit (HOSPITAL_COMMUNITY): Payer: Self-pay

## 2022-07-05 DIAGNOSIS — C189 Malignant neoplasm of colon, unspecified: Secondary | ICD-10-CM

## 2022-07-05 MED ORDER — CAPECITABINE 500 MG PO TABS
500.0000 mg | ORAL_TABLET | Freq: Two times a day (BID) | ORAL | 0 refills | Status: DC
  Filled 2022-07-05: qty 28, 21d supply, fill #0

## 2022-07-05 NOTE — Telephone Encounter (Signed)
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    0 Result Notes           Component Ref Range & Units 6 d ago (06/29/22) 3 wk ago (06/14/22) 3 wk ago (06/13/22) 1 mo ago (05/24/22) 2 mo ago (04/26/22) 3 mo ago (04/05/22) 3 mo ago (03/15/22)  WBC 4.0 - 10.5 K/uL 9.0  7.2  6.9  7.3  6.7  6.2  8.6   RBC 4.22 - 5.81 MIL/uL 4.12 Low   3.84 Low   3.96 Low   4.09 Low   4.21 Low   3.90 Low   4.11 Low    Hemoglobin 13.0 - 17.0 g/dL 13.7  12.7 Low   13.0  13.5  14.0  13.0  13.9   HCT 39.0 - 52.0 % 39.6  37.4 Low   38.9 Low   39.0  40.1  37.7 Low   39.6   MCV 80.0 - 100.0 fL 96.1  97.4  98.2  95.4  95.2  96.7  96.4   MCH 26.0 - 34.0 pg 33.3  33.1  32.8  33.0  33.3  33.3  33.8   MCHC 30.0 - 36.0 g/dL 34.6  34.0  33.4  34.6  34.9  34.5  35.1   RDW 11.5 - 15.5 % 14.6  14.8  14.8  14.6  15.2  14.9  14.6   Platelets 150 - 400 K/uL 162  83 Low  CM  91 Low   148 Low   108 Low  CM  106 Low  CM  125 Low    nRBC 0.0 - 0.2 % 0.0  0.0 CM  0.0 CM  0.0  0.0  0.0  0.0   Neutrophils Relative % % 67    58  58  62  64   Neutro Abs 1.7 - 7.7 K/uL 6.1    4.3  3.9  3.9  5.5   Lymphocytes Relative % '21    27  28  26  24   '$ Lymphs Abs 0.7 - 4.0 K/uL 1.9    1.9  1.9  1.6  2.1   Monocytes Relative % '9    9  9  7  7   '$ Monocytes Absolute 0.1 - 1.0 K/uL 0.8    0.6  0.6  0.4  0.6   Eosinophils Relative % '2    3  3  3  3   '$ Eosinophils Absolute 0.0 - 0.5 K/uL 0.2    0.2  0.2  0.2  0.3   Basophils Relative % '1    1  1  1  1   '$ Basophils Absolute 0.0 - 0.1 K/uL 0.1    0.1  0.1  0.0  0.1   Immature Granulocytes % 0    '2  1  1  1   '$ Abs Immature Granulocytes 0.00 - 0.07 K/uL 0.04    0.11 High  CM  0.06 CM  0.05 CM  0.04 CM   Comment: Performed at [facility], [address] [place], [place] [address]  Resulting Agency  [facility] [place] [facility] [place] [facility] [place] [facility]  LAB [place] [facility] LAB [place] [facility] [place] [facility]         Specimen Collected: 06/29/22 08:13 Last Resulted: 06/29/22  08:[place] Microbiologist History    View All Conversations on this Encounter      CM=Additional comments      Result Care Coordination   Patient Communication   Add Comments   Add Notifications  Back to Top       Other Results from 06/29/2022  Magnesium Order: [id number] Status: Final result    Visible to patient: Yes (seen)    Next appt: 07/12/2022 at 01:30 PM in Radiology ([facility]PET CT1)    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    0 Result Notes           Component Ref Range & Units 6 d ago (06/29/22) 1 mo ago (05/24/22) 2 mo ago (04/26/22) 3 mo ago (04/05/22) 3 mo ago (03/15/22) 5 mo ago (02/01/22) 5 mo ago (01/11/22)  Magnesium 1.7 - 2.4 mg/dL 2.1  1.9 CM  2.0 CM  1.8 CM  1.9 CM  2.1 CM  1.8 CM   Comment: Performed at [facility], [address] [place], [place] [address]  Resulting Agency  [place] [facility] [place] [facility] [place] [facility] [place] [facility] [place] [facility] [place] [facility] [place] [facility]         Specimen Collected: 06/29/22 08:13 Last Resulted: 06/29/22 08:51      Lab Flowsheet    Order Details    View Encounter    Lab and Collection Details    Routing    Result History    View All Conversations on this Encounter      CM=Additional comments      Result Care Coordination   Patient Communication   Add Comments   Seen Back to Top          Contains abnormal data Comprehensive metabolic panel Order: [id number] Status: Final result    Visible to patient: Yes (seen)    Next appt: 07/12/2022 at 01:30 PM in Radiology ([facility]PET CT1)    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    0 Result Notes           Component Ref Range & Units 6 d ago (06/29/22) 3 wk ago (06/14/22) 3 wk ago (06/13/22) 1 mo ago (05/24/22) 2 mo ago (04/26/22) 3 mo ago (04/05/22) 3 mo  ago (03/15/22)  Sodium 135 - 145 mmol/L 136  141 CM  141  136  135  136 VC  136   Potassium 3.5 - 5.1 mmol/L 3.5  3.9  3.8  4.0  3.7  3.8 VC  3.6   Chloride 98 - 111 mmol/L 106  112 High   109  104  104  109 VC  105   CO2 22 - 32 mmol/L '24  22  23  23  22  22 '$ VC  21 Low    Glucose, Bld 70 - 99 mg/dL 150 High   93 CM  93 CM  107 High  CM  93 CM  102 High  CM  140 High  CM   Comment: Glucose reference range applies only to samples taken after fasting for at least 8 hours.  BUN 8 - 23 mg/dL 36 High   25 High   28 High   30 High   32 High   30 High   38 High    Creatinine, Ser  0.61 - 1.24 mg/dL 1.44 High   1.29 High   1.48 High   1.11  1.10  0.92  1.25 High    Calcium 8.9 - 10.3 mg/dL 8.7 Low   8.8 Low   8.8 Low   9.1  9.3  8.8 Low   8.9   Total Protein 6.5 - 8.1 g/dL 7.2   6.8  7.5  7.3  6.8  7.0   Albumin 3.5 - 5.0 g/dL 3.9   3.6  3.7  3.8  3.5  3.8   AST 15 - 41 U/L 24   37  27  28  32  33   ALT 0 - 44 U/L '13   22  17  20  23  19   '$ Alkaline Phosphatase 38 - 126 U/L 140 High    124  145 High   131 High   119  119   Total Bilirubin 0.3 - 1.2 mg/dL 0.9   0.9  1.1  0.7  0.9  0.5   GFR, Estimated >60 mL/min 52 Low   59 Low  CM  50 Low  CM  >60 CM  >60 CM  >60 CM  >60 CM   Comment: (NOTE)  Calculated using the CKD-EPI Creatinine Equation (2021)   Anion gap 5 - '15 6  7 '$ CM  9 CM  9 CM  9 CM  5 VC, CM  10 CM   Comment: Performed at [facility], [address] [place], [place] [address]  Resulting Agency  [facility] CL[facility] LAB [place] CL[facility] LAB [place] [facility] LAB [place] [facility] [place] [facility] [place] [facility] [place] [facility] LAB         Specimen Collected: 06/29/22 08:13 Last Resulted: 06/29/22 08:51

## 2022-07-07 ENCOUNTER — Other Ambulatory Visit (HOSPITAL_COMMUNITY): Payer: Self-pay

## 2022-07-12 ENCOUNTER — Other Ambulatory Visit: Payer: Self-pay | Admitting: Oncology

## 2022-07-12 ENCOUNTER — Encounter
Admission: RE | Admit: 2022-07-12 | Discharge: 2022-07-12 | Disposition: A | Discharge status: Home / Self Care | Payer: Medicare HMO | Source: Ambulatory Visit | Attending: Radiation Oncology | Admitting: Radiation Oncology

## 2022-07-12 DIAGNOSIS — C189 Malignant neoplasm of colon, unspecified: Secondary | ICD-10-CM | POA: Insufficient documentation

## 2022-07-12 DIAGNOSIS — C772 Secondary and unspecified malignant neoplasm of intra-abdominal lymph nodes: Secondary | ICD-10-CM | POA: Diagnosis not present

## 2022-07-12 LAB — GLUCOSE, CAPILLARY: Glucose-Capillary: 98 mg/dL (ref 70–99)

## 2022-07-12 MED ORDER — FLUDEOXYGLUCOSE F - 18 (FDG) INJECTION
8.2000 | Freq: Once | INTRAVENOUS | Status: AC | PRN
  Administered 2022-07-12: 8.92 via INTRAVENOUS

## 2022-07-13 ENCOUNTER — Encounter: Payer: Self-pay | Admitting: Oncology

## 2022-07-14 ENCOUNTER — Ambulatory Visit: Payer: Medicare HMO

## 2022-07-14 ENCOUNTER — Ambulatory Visit
Admission: RE | Admit: 2022-07-14 | Discharge: 2022-07-14 | Disposition: A | Discharge status: Home / Self Care | Payer: Medicare HMO | Source: Ambulatory Visit | Attending: Radiation Oncology | Admitting: Radiation Oncology

## 2022-07-14 DIAGNOSIS — C772 Secondary and unspecified malignant neoplasm of intra-abdominal lymph nodes: Secondary | ICD-10-CM | POA: Insufficient documentation

## 2022-07-14 DIAGNOSIS — C189 Malignant neoplasm of colon, unspecified: Secondary | ICD-10-CM | POA: Diagnosis not present

## 2022-07-14 DIAGNOSIS — Z5111 Encounter for antineoplastic chemotherapy: Secondary | ICD-10-CM | POA: Insufficient documentation

## 2022-07-14 DIAGNOSIS — C778 Secondary and unspecified malignant neoplasm of lymph nodes of multiple regions: Secondary | ICD-10-CM | POA: Insufficient documentation

## 2022-07-14 DIAGNOSIS — Z510 Encounter for antineoplastic radiation therapy: Secondary | ICD-10-CM | POA: Insufficient documentation

## 2022-07-14 DIAGNOSIS — Z5112 Encounter for antineoplastic immunotherapy: Secondary | ICD-10-CM | POA: Insufficient documentation

## 2022-07-14 NOTE — Addendum Note (Signed)
Encounter addended by: Noreene Filbert, MD on: 07/14/2022 2:15 PM  Actions taken: Level of Service modified

## 2022-07-14 NOTE — Progress Notes (Signed)
Radiation Oncology Follow up Note  Name: Eric Simon   Date:   07/14/2022 MRN:  [id number] DOB: 12/25/1950    This 71 y.o. 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  REFERRING PROVIDER: Tejan-Sie, S Ahmed, MD  HPI: Patient is a 71-year-old male originally consulted in early July for progressive retroperitoneal and pelvic lymph nodes and patient with known stage IV colon cancer.  His CT-guided biopsy of retroperitoneal peritoneal lymph nodes were positive for metastatic adenocarcinoma of colorectal origin.  He had a sigmoid mass consistent with adenocarcinoma.  Tumor was wild-type both K-ras and BRAF.  He has been treated with FOLFOX 7 bevacizumab since July 2020.  He has recently been switched to oral Xeloda irinotecan penatumomab.  He had a recent PET scan showing hypermetabolic activity in the left internal/external iliac nodal chain.  Does have some soft findings of the adenopathy in the retroperitoneum in the lower lumbar spinal region.  He continues to have back pain.  We are proceeding with simulation of his lower lumbar spine and left pelvic region targeting areas of hypermetabolic lymph node involvement.  COMPLICATIONS OF TREATMENT: none  FOLLOW UP COMPLIANCE: keeps appointments   PHYSICAL EXAM:  There were no vitals taken for this visit. Well-developed well-nourished patient in NAD. HEENT reveals PERLA, EOMI, discs not visualized.  Oral cavity is clear. No oral mucosal lesions are identified. Neck is clear without evidence of cervical or supraclavicular adenopathy. Lungs are clear to A&P. Cardiac examination is essentially unremarkable with regular rate and rhythm without murmur rub or thrill. Abdomen is benign with no organomegaly or masses noted. Motor sensory and DTR levels are equal and symmetric in the upper and lower extremities.  Cranial nerves II through XII are grossly intact. Proprioception is intact. No peripheral adenopathy or edema is identified. No motor or sensory levels are noted. Crude visual fields are within normal range.  RADIOLOGY RESULTS: PET CT scan reviewed compatible with above-stated findings  PLAN: This time we will go ahead with I palliative radiation therapy to the lower lumbar spine and left hypermetabolic pelvic lymph nodes.  l I will plan on 40 Gray in 20 fractions.  Risks and benefits of treatment occluding possible increased lower urinary tract symptoms diarrhea fatigue alteration of blood counts all reviewed with the patient.  I would like to take this opportunity to thank you for allowing me to participate in the care of your patient..    Glenn Chrystal, MD  

## 2022-07-16 ENCOUNTER — Ambulatory Visit: Payer: Medicare HMO | Admitting: Radiation Oncology

## 2022-07-20 DIAGNOSIS — C189 Malignant neoplasm of colon, unspecified: Secondary | ICD-10-CM | POA: Diagnosis not present

## 2022-07-20 DIAGNOSIS — C772 Secondary and unspecified malignant neoplasm of intra-abdominal lymph nodes: Secondary | ICD-10-CM | POA: Diagnosis not present

## 2022-07-21 ENCOUNTER — Ambulatory Visit: Admission: RE | Admit: 2022-07-21 | Payer: Medicare HMO | Source: Ambulatory Visit

## 2022-07-21 DIAGNOSIS — Z5111 Encounter for antineoplastic chemotherapy: Secondary | ICD-10-CM | POA: Diagnosis not present

## 2022-07-21 DIAGNOSIS — C189 Malignant neoplasm of colon, unspecified: Secondary | ICD-10-CM | POA: Diagnosis not present

## 2022-07-21 DIAGNOSIS — C778 Secondary and unspecified malignant neoplasm of lymph nodes of multiple regions: Secondary | ICD-10-CM | POA: Diagnosis not present

## 2022-07-21 DIAGNOSIS — Z5112 Encounter for antineoplastic immunotherapy: Secondary | ICD-10-CM | POA: Diagnosis not present

## 2022-07-21 DIAGNOSIS — C772 Secondary and unspecified malignant neoplasm of intra-abdominal lymph nodes: Secondary | ICD-10-CM | POA: Diagnosis not present

## 2022-07-21 DIAGNOSIS — Z510 Encounter for antineoplastic radiation therapy: Secondary | ICD-10-CM | POA: Diagnosis not present

## 2022-07-22 ENCOUNTER — Ambulatory Visit
Admission: RE | Admit: 2022-07-22 | Discharge: 2022-07-22 | Disposition: A | Discharge status: Home / Self Care | Payer: Medicare HMO | Source: Ambulatory Visit | Attending: Radiation Oncology | Admitting: Radiation Oncology

## 2022-07-22 ENCOUNTER — Other Ambulatory Visit: Payer: Self-pay

## 2022-07-22 DIAGNOSIS — Z510 Encounter for antineoplastic radiation therapy: Secondary | ICD-10-CM | POA: Diagnosis not present

## 2022-07-22 DIAGNOSIS — C778 Secondary and unspecified malignant neoplasm of lymph nodes of multiple regions: Secondary | ICD-10-CM | POA: Diagnosis not present

## 2022-07-22 DIAGNOSIS — C189 Malignant neoplasm of colon, unspecified: Secondary | ICD-10-CM | POA: Diagnosis not present

## 2022-07-22 DIAGNOSIS — Z5111 Encounter for antineoplastic chemotherapy: Secondary | ICD-10-CM | POA: Diagnosis not present

## 2022-07-22 DIAGNOSIS — Z5112 Encounter for antineoplastic immunotherapy: Secondary | ICD-10-CM | POA: Diagnosis not present

## 2022-07-22 DIAGNOSIS — C772 Secondary and unspecified malignant neoplasm of intra-abdominal lymph nodes: Secondary | ICD-10-CM | POA: Diagnosis not present

## 2022-07-22 LAB — RAD ONC ARIA SESSION SUMMARY
Course Elapsed Days: 0
Plan Fractions Treated to Date: 1
Plan Prescribed Dose Per Fraction: 2 Gy
Plan Total Fractions Prescribed: 20
Plan Total Prescribed Dose: 40 Gy
Reference Point Dosage Given to Date: 2 Gy
Reference Point Session Dosage Given: 2 Gy
Session Number: 1

## 2022-07-23 ENCOUNTER — Ambulatory Visit
Admission: RE | Admit: 2022-07-23 | Discharge: 2022-07-23 | Disposition: A | Discharge status: Home / Self Care | Payer: Medicare HMO | Source: Ambulatory Visit | Attending: Radiation Oncology | Admitting: Radiation Oncology

## 2022-07-23 ENCOUNTER — Other Ambulatory Visit: Payer: Self-pay

## 2022-07-23 DIAGNOSIS — Z5111 Encounter for antineoplastic chemotherapy: Secondary | ICD-10-CM | POA: Diagnosis not present

## 2022-07-23 DIAGNOSIS — K219 Gastro-esophageal reflux disease without esophagitis: Secondary | ICD-10-CM | POA: Diagnosis not present

## 2022-07-23 DIAGNOSIS — E785 Hyperlipidemia, unspecified: Secondary | ICD-10-CM | POA: Diagnosis not present

## 2022-07-23 DIAGNOSIS — J301 Allergic rhinitis due to pollen: Secondary | ICD-10-CM | POA: Diagnosis not present

## 2022-07-23 DIAGNOSIS — F17200 Nicotine dependence, unspecified, uncomplicated: Secondary | ICD-10-CM | POA: Diagnosis not present

## 2022-07-23 DIAGNOSIS — C778 Secondary and unspecified malignant neoplasm of lymph nodes of multiple regions: Secondary | ICD-10-CM | POA: Diagnosis not present

## 2022-07-23 DIAGNOSIS — Z510 Encounter for antineoplastic radiation therapy: Secondary | ICD-10-CM | POA: Diagnosis not present

## 2022-07-23 DIAGNOSIS — Z5112 Encounter for antineoplastic immunotherapy: Secondary | ICD-10-CM | POA: Diagnosis not present

## 2022-07-23 DIAGNOSIS — E790 Hyperuricemia without signs of inflammatory arthritis and tophaceous disease: Secondary | ICD-10-CM | POA: Diagnosis not present

## 2022-07-23 DIAGNOSIS — I2510 Atherosclerotic heart disease of native coronary artery without angina pectoris: Secondary | ICD-10-CM | POA: Diagnosis not present

## 2022-07-23 DIAGNOSIS — J019 Acute sinusitis, unspecified: Secondary | ICD-10-CM | POA: Diagnosis not present

## 2022-07-23 DIAGNOSIS — I10 Essential (primary) hypertension: Secondary | ICD-10-CM | POA: Diagnosis not present

## 2022-07-23 DIAGNOSIS — C189 Malignant neoplasm of colon, unspecified: Secondary | ICD-10-CM | POA: Diagnosis not present

## 2022-07-23 DIAGNOSIS — J449 Chronic obstructive pulmonary disease, unspecified: Secondary | ICD-10-CM | POA: Diagnosis not present

## 2022-07-23 DIAGNOSIS — C772 Secondary and unspecified malignant neoplasm of intra-abdominal lymph nodes: Secondary | ICD-10-CM | POA: Diagnosis not present

## 2022-07-23 LAB — RAD ONC ARIA SESSION SUMMARY
Course Elapsed Days: 1
Plan Fractions Treated to Date: 2
Plan Prescribed Dose Per Fraction: 2 Gy
Plan Total Fractions Prescribed: 20
Plan Total Prescribed Dose: 40 Gy
Reference Point Dosage Given to Date: 4 Gy
Reference Point Session Dosage Given: 2 Gy
Session Number: 2

## 2022-07-26 ENCOUNTER — Ambulatory Visit
Admission: RE | Admit: 2022-07-26 | Discharge: 2022-07-26 | Disposition: A | Discharge status: Home / Self Care | Payer: Medicare HMO | Source: Ambulatory Visit | Attending: Radiation Oncology | Admitting: Radiation Oncology

## 2022-07-26 ENCOUNTER — Other Ambulatory Visit: Payer: Self-pay

## 2022-07-26 DIAGNOSIS — Z5112 Encounter for antineoplastic immunotherapy: Secondary | ICD-10-CM | POA: Diagnosis not present

## 2022-07-26 DIAGNOSIS — C189 Malignant neoplasm of colon, unspecified: Secondary | ICD-10-CM | POA: Diagnosis not present

## 2022-07-26 DIAGNOSIS — C772 Secondary and unspecified malignant neoplasm of intra-abdominal lymph nodes: Secondary | ICD-10-CM | POA: Diagnosis not present

## 2022-07-26 DIAGNOSIS — C778 Secondary and unspecified malignant neoplasm of lymph nodes of multiple regions: Secondary | ICD-10-CM | POA: Diagnosis not present

## 2022-07-26 DIAGNOSIS — Z5111 Encounter for antineoplastic chemotherapy: Secondary | ICD-10-CM | POA: Diagnosis not present

## 2022-07-26 DIAGNOSIS — Z510 Encounter for antineoplastic radiation therapy: Secondary | ICD-10-CM | POA: Diagnosis not present

## 2022-07-26 LAB — RAD ONC ARIA SESSION SUMMARY
Course Elapsed Days: 4
Plan Fractions Treated to Date: 3
Plan Prescribed Dose Per Fraction: 2 Gy
Plan Total Fractions Prescribed: 20
Plan Total Prescribed Dose: 40 Gy
Reference Point Dosage Given to Date: 6 Gy
Reference Point Session Dosage Given: 2 Gy
Session Number: 3

## 2022-07-27 ENCOUNTER — Ambulatory Visit
Admission: RE | Admit: 2022-07-27 | Discharge: 2022-07-27 | Disposition: A | Discharge status: Home / Self Care | Payer: Medicare HMO | Source: Ambulatory Visit | Attending: Radiation Oncology | Admitting: Radiation Oncology

## 2022-07-27 ENCOUNTER — Other Ambulatory Visit: Payer: Self-pay

## 2022-07-27 DIAGNOSIS — Z510 Encounter for antineoplastic radiation therapy: Secondary | ICD-10-CM | POA: Diagnosis not present

## 2022-07-27 DIAGNOSIS — Z5111 Encounter for antineoplastic chemotherapy: Secondary | ICD-10-CM | POA: Diagnosis not present

## 2022-07-27 DIAGNOSIS — Z5112 Encounter for antineoplastic immunotherapy: Secondary | ICD-10-CM | POA: Diagnosis not present

## 2022-07-27 DIAGNOSIS — C189 Malignant neoplasm of colon, unspecified: Secondary | ICD-10-CM | POA: Diagnosis not present

## 2022-07-27 DIAGNOSIS — C772 Secondary and unspecified malignant neoplasm of intra-abdominal lymph nodes: Secondary | ICD-10-CM | POA: Diagnosis not present

## 2022-07-27 DIAGNOSIS — C778 Secondary and unspecified malignant neoplasm of lymph nodes of multiple regions: Secondary | ICD-10-CM | POA: Diagnosis not present

## 2022-07-27 LAB — RAD ONC ARIA SESSION SUMMARY
Course Elapsed Days: 5
Plan Fractions Treated to Date: 4
Plan Prescribed Dose Per Fraction: 2 Gy
Plan Total Fractions Prescribed: 20
Plan Total Prescribed Dose: 40 Gy
Reference Point Dosage Given to Date: 8 Gy
Reference Point Session Dosage Given: 2 Gy
Session Number: 4

## 2022-07-28 ENCOUNTER — Other Ambulatory Visit: Payer: Self-pay

## 2022-07-28 ENCOUNTER — Inpatient Hospital Stay: Payer: Medicare HMO

## 2022-07-28 ENCOUNTER — Ambulatory Visit
Admission: RE | Admit: 2022-07-28 | Discharge: 2022-07-28 | Disposition: A | Discharge status: Home / Self Care | Payer: Medicare HMO | Source: Ambulatory Visit | Attending: Radiation Oncology | Admitting: Radiation Oncology

## 2022-07-28 DIAGNOSIS — C189 Malignant neoplasm of colon, unspecified: Secondary | ICD-10-CM | POA: Insufficient documentation

## 2022-07-28 DIAGNOSIS — C778 Secondary and unspecified malignant neoplasm of lymph nodes of multiple regions: Secondary | ICD-10-CM | POA: Insufficient documentation

## 2022-07-28 DIAGNOSIS — Z452 Encounter for adjustment and management of vascular access device: Secondary | ICD-10-CM | POA: Insufficient documentation

## 2022-07-28 DIAGNOSIS — Z5112 Encounter for antineoplastic immunotherapy: Secondary | ICD-10-CM | POA: Insufficient documentation

## 2022-07-28 DIAGNOSIS — Z5111 Encounter for antineoplastic chemotherapy: Secondary | ICD-10-CM | POA: Insufficient documentation

## 2022-07-28 DIAGNOSIS — C772 Secondary and unspecified malignant neoplasm of intra-abdominal lymph nodes: Secondary | ICD-10-CM | POA: Diagnosis not present

## 2022-07-28 DIAGNOSIS — Z510 Encounter for antineoplastic radiation therapy: Secondary | ICD-10-CM | POA: Diagnosis not present

## 2022-07-28 LAB — RAD ONC ARIA SESSION SUMMARY
Course Elapsed Days: 6
Plan Fractions Treated to Date: 5
Plan Prescribed Dose Per Fraction: 2 Gy
Plan Total Fractions Prescribed: 20
Plan Total Prescribed Dose: 40 Gy
Reference Point Dosage Given to Date: 10 Gy
Reference Point Session Dosage Given: 2 Gy
Session Number: 5

## 2022-07-28 LAB — CBC WITH DIFFERENTIAL/PLATELET
Abs Immature Granulocytes: 0.04 10*3/uL (ref 0.00–0.07)
Basophils Absolute: 0 10*3/uL (ref 0.0–0.1)
Basophils Relative: 1 %
Eosinophils Absolute: 0.2 10*3/uL (ref 0.0–0.5)
Eosinophils Relative: 4 %
HCT: 36.7 % — ABNORMAL LOW (ref 39.0–52.0)
Hemoglobin: 12.6 g/dL — ABNORMAL LOW (ref 13.0–17.0)
Immature Granulocytes: 1 %
Lymphocytes Relative: 23 %
Lymphs Abs: 1.2 10*3/uL (ref 0.7–4.0)
MCH: 32.4 pg (ref 26.0–34.0)
MCHC: 34.3 g/dL (ref 30.0–36.0)
MCV: 94.3 fL (ref 80.0–100.0)
Monocytes Absolute: 0.4 10*3/uL (ref 0.1–1.0)
Monocytes Relative: 8 %
Neutro Abs: 3.3 10*3/uL (ref 1.7–7.7)
Neutrophils Relative %: 63 %
Platelets: 134 10*3/uL — ABNORMAL LOW (ref 150–400)
RBC: 3.89 MIL/uL — ABNORMAL LOW (ref 4.22–5.81)
RDW: 14.4 % (ref 11.5–15.5)
WBC: 5.3 10*3/uL (ref 4.0–10.5)
nRBC: 0 % (ref 0.0–0.2)

## 2022-07-28 MED ORDER — SODIUM CHLORIDE 0.9% FLUSH
10.0000 mL | Freq: Once | INTRAVENOUS | Status: AC
  Administered 2022-07-28: 10 mL via INTRAVENOUS
  Filled 2022-07-28: qty 10

## 2022-07-28 MED ORDER — HEPARIN SOD (PORK) LOCK FLUSH 100 UNIT/ML IV SOLN
500.0000 [IU] | Freq: Once | INTRAVENOUS | Status: AC
  Administered 2022-07-28: 500 [IU] via INTRAVENOUS
  Filled 2022-07-28: qty 5

## 2022-07-29 ENCOUNTER — Other Ambulatory Visit (HOSPITAL_COMMUNITY): Payer: Self-pay

## 2022-07-29 ENCOUNTER — Other Ambulatory Visit: Payer: Self-pay

## 2022-07-29 ENCOUNTER — Ambulatory Visit
Admission: RE | Admit: 2022-07-29 | Discharge: 2022-07-29 | Disposition: A | Discharge status: Home / Self Care | Payer: Medicare HMO | Source: Ambulatory Visit | Attending: Radiation Oncology | Admitting: Radiation Oncology

## 2022-07-29 DIAGNOSIS — C778 Secondary and unspecified malignant neoplasm of lymph nodes of multiple regions: Secondary | ICD-10-CM | POA: Diagnosis not present

## 2022-07-29 DIAGNOSIS — C772 Secondary and unspecified malignant neoplasm of intra-abdominal lymph nodes: Secondary | ICD-10-CM | POA: Diagnosis not present

## 2022-07-29 DIAGNOSIS — Z5111 Encounter for antineoplastic chemotherapy: Secondary | ICD-10-CM | POA: Diagnosis not present

## 2022-07-29 DIAGNOSIS — C189 Malignant neoplasm of colon, unspecified: Secondary | ICD-10-CM | POA: Diagnosis not present

## 2022-07-29 DIAGNOSIS — Z5112 Encounter for antineoplastic immunotherapy: Secondary | ICD-10-CM | POA: Diagnosis not present

## 2022-07-29 DIAGNOSIS — Z510 Encounter for antineoplastic radiation therapy: Secondary | ICD-10-CM | POA: Diagnosis not present

## 2022-07-29 LAB — RAD ONC ARIA SESSION SUMMARY
Course Elapsed Days: 7
Plan Fractions Treated to Date: 6
Plan Prescribed Dose Per Fraction: 2 Gy
Plan Total Fractions Prescribed: 20
Plan Total Prescribed Dose: 40 Gy
Reference Point Dosage Given to Date: 12 Gy
Reference Point Session Dosage Given: 2 Gy
Session Number: 6

## 2022-07-30 ENCOUNTER — Other Ambulatory Visit: Payer: Self-pay | Admitting: *Deleted

## 2022-07-30 ENCOUNTER — Ambulatory Visit
Admission: RE | Admit: 2022-07-30 | Discharge: 2022-07-30 | Disposition: A | Discharge status: Home / Self Care | Payer: Medicare HMO | Source: Ambulatory Visit | Attending: Radiation Oncology | Admitting: Radiation Oncology

## 2022-07-30 ENCOUNTER — Other Ambulatory Visit: Payer: Self-pay

## 2022-07-30 DIAGNOSIS — C772 Secondary and unspecified malignant neoplasm of intra-abdominal lymph nodes: Secondary | ICD-10-CM | POA: Diagnosis not present

## 2022-07-30 DIAGNOSIS — Z510 Encounter for antineoplastic radiation therapy: Secondary | ICD-10-CM | POA: Diagnosis not present

## 2022-07-30 DIAGNOSIS — Z5112 Encounter for antineoplastic immunotherapy: Secondary | ICD-10-CM | POA: Diagnosis not present

## 2022-07-30 DIAGNOSIS — C189 Malignant neoplasm of colon, unspecified: Secondary | ICD-10-CM | POA: Diagnosis not present

## 2022-07-30 DIAGNOSIS — C778 Secondary and unspecified malignant neoplasm of lymph nodes of multiple regions: Secondary | ICD-10-CM | POA: Diagnosis not present

## 2022-07-30 DIAGNOSIS — Z5111 Encounter for antineoplastic chemotherapy: Secondary | ICD-10-CM | POA: Diagnosis not present

## 2022-07-30 LAB — RAD ONC ARIA SESSION SUMMARY
Course Elapsed Days: 8
Plan Fractions Treated to Date: 7
Plan Prescribed Dose Per Fraction: 2 Gy
Plan Total Fractions Prescribed: 20
Plan Total Prescribed Dose: 40 Gy
Reference Point Dosage Given to Date: 14 Gy
Reference Point Session Dosage Given: 2 Gy
Session Number: 7

## 2022-08-02 ENCOUNTER — Other Ambulatory Visit: Payer: Self-pay | Admitting: Oncology

## 2022-08-02 ENCOUNTER — Other Ambulatory Visit (HOSPITAL_COMMUNITY): Payer: Self-pay

## 2022-08-02 ENCOUNTER — Other Ambulatory Visit: Payer: Self-pay

## 2022-08-02 ENCOUNTER — Ambulatory Visit
Admission: RE | Admit: 2022-08-02 | Discharge: 2022-08-02 | Disposition: A | Discharge status: Home / Self Care | Payer: Medicare HMO | Source: Ambulatory Visit | Attending: Radiation Oncology | Admitting: Radiation Oncology

## 2022-08-02 DIAGNOSIS — Z5112 Encounter for antineoplastic immunotherapy: Secondary | ICD-10-CM | POA: Diagnosis not present

## 2022-08-02 DIAGNOSIS — C772 Secondary and unspecified malignant neoplasm of intra-abdominal lymph nodes: Secondary | ICD-10-CM | POA: Diagnosis not present

## 2022-08-02 DIAGNOSIS — Z510 Encounter for antineoplastic radiation therapy: Secondary | ICD-10-CM | POA: Diagnosis not present

## 2022-08-02 DIAGNOSIS — C189 Malignant neoplasm of colon, unspecified: Secondary | ICD-10-CM | POA: Diagnosis not present

## 2022-08-02 DIAGNOSIS — C778 Secondary and unspecified malignant neoplasm of lymph nodes of multiple regions: Secondary | ICD-10-CM | POA: Diagnosis not present

## 2022-08-02 DIAGNOSIS — Z5111 Encounter for antineoplastic chemotherapy: Secondary | ICD-10-CM | POA: Diagnosis not present

## 2022-08-02 LAB — RAD ONC ARIA SESSION SUMMARY
Course Elapsed Days: 11
Plan Fractions Treated to Date: 8
Plan Prescribed Dose Per Fraction: 2 Gy
Plan Total Fractions Prescribed: 20
Plan Total Prescribed Dose: 40 Gy
Reference Point Dosage Given to Date: 16 Gy
Reference Point Session Dosage Given: 2 Gy
Session Number: 8

## 2022-08-02 MED ORDER — CAPECITABINE 500 MG PO TABS
500.0000 mg | ORAL_TABLET | Freq: Two times a day (BID) | ORAL | 0 refills | Status: DC
  Filled 2022-08-02: qty 28, 21d supply, fill #0

## 2022-08-02 NOTE — Telephone Encounter (Signed)
CBC with Differential/Platelet Order: [id number] Status: Final result    Visible to patient: Yes (seen)    Next appt: Today at 09:00 AM in [facility])    Dx: Encounter for antineoplastic chemothe...    0 Result Notes           Component Ref Range & Units 5 d ago (07/28/22) 1 mo ago (06/29/22) 1 mo ago (06/14/22) 1 mo ago (06/13/22) 2 mo ago (05/24/22) 3 mo ago (04/26/22) 3 mo ago (04/05/22)  WBC 4.0 - 10.5 K/uL 5.3  9.0  7.2  6.9  7.3  6.7  6.2   RBC 4.22 - 5.81 MIL/uL 3.89 Low   4.12 Low   3.84 Low   3.96 Low   4.09 Low   4.21 Low   3.90 Low    Hemoglobin 13.0 - 17.0 g/dL 12.6 Low   13.7  12.7 Low   13.0  13.5  14.0  13.0   HCT 39.0 - 52.0 % 36.7 Low   39.6  37.4 Low   38.9 Low   39.0  40.1  37.7 Low    MCV 80.0 - 100.0 fL 94.3  96.1  97.4  98.2  95.4  95.2  96.7   MCH 26.0 - 34.0 pg 32.4  33.3  33.1  32.8  33.0  33.3  33.3   MCHC 30.0 - 36.0 g/dL 34.3  34.6  34.0  33.4  34.6  34.9  34.5   RDW 11.5 - 15.5 % 14.4  14.6  14.8  14.8  14.6  15.2  14.9   Platelets 150 - 400 K/uL 134 Low   162  83 Low  CM  91 Low   148 Low   108 Low  CM  106 Low  CM   Comment: SPECIMEN CHECKED FOR CLOTS  nRBC 0.0 - 0.2 % 0.0  0.0  0.0 CM  0.0 CM  0.0  0.0  0.0   Neutrophils Relative % % 63  67    58  58  62   Neutro Abs 1.7 - 7.7 K/uL 3.3  6.1    4.3  3.9  3.9   Lymphocytes Relative % '23  21    27  28  26   '$ Lymphs Abs 0.7 - 4.0 K/uL 1.2  1.9    1.9  1.9  1.6   Monocytes Relative % '8  9    9  9  7   '$ Monocytes Absolute 0.1 - 1.0 K/uL 0.4  0.8    0.6  0.6  0.4   Eosinophils Relative % '4  2    3  3  3   '$ Eosinophils Absolute 0.0 - 0.5 K/uL 0.2  0.2    0.2  0.2  0.2   Basophils Relative % '1  1    1  1  1   '$ Basophils Absolute 0.0 - 0.1 K/uL 0.0  0.1    0.1  0.1  0.0   Immature Granulocytes % 1  0    '2  1  1   '$ Abs Immature Granulocytes 0.00 - 0.07 K/uL 0.04  0.04 CM    0.11 High  CM  0.06 CM  0.05 CM   Comment: Performed at [facility], [address] [place], [place] [address]   Resulting Agency  [facility] [place]  Specimen Collected: 07/28/22 09:00 Last Resulted: 07/28/22 09:23

## 2022-08-03 ENCOUNTER — Other Ambulatory Visit: Payer: Self-pay

## 2022-08-03 ENCOUNTER — Ambulatory Visit
Admission: RE | Admit: 2022-08-03 | Discharge: 2022-08-03 | Disposition: A | Discharge status: Home / Self Care | Payer: Medicare HMO | Source: Ambulatory Visit | Attending: Radiation Oncology | Admitting: Radiation Oncology

## 2022-08-03 DIAGNOSIS — Z510 Encounter for antineoplastic radiation therapy: Secondary | ICD-10-CM | POA: Diagnosis not present

## 2022-08-03 DIAGNOSIS — C778 Secondary and unspecified malignant neoplasm of lymph nodes of multiple regions: Secondary | ICD-10-CM | POA: Diagnosis not present

## 2022-08-03 DIAGNOSIS — Z5112 Encounter for antineoplastic immunotherapy: Secondary | ICD-10-CM | POA: Diagnosis not present

## 2022-08-03 DIAGNOSIS — C772 Secondary and unspecified malignant neoplasm of intra-abdominal lymph nodes: Secondary | ICD-10-CM | POA: Diagnosis not present

## 2022-08-03 DIAGNOSIS — C189 Malignant neoplasm of colon, unspecified: Secondary | ICD-10-CM | POA: Diagnosis not present

## 2022-08-03 DIAGNOSIS — Z5111 Encounter for antineoplastic chemotherapy: Secondary | ICD-10-CM | POA: Diagnosis not present

## 2022-08-03 LAB — RAD ONC ARIA SESSION SUMMARY
Course Elapsed Days: 12
Plan Fractions Treated to Date: 9
Plan Prescribed Dose Per Fraction: 2 Gy
Plan Total Fractions Prescribed: 20
Plan Total Prescribed Dose: 40 Gy
Reference Point Dosage Given to Date: 18 Gy
Reference Point Session Dosage Given: 2 Gy
Session Number: 9

## 2022-08-04 ENCOUNTER — Other Ambulatory Visit: Payer: Self-pay

## 2022-08-04 ENCOUNTER — Ambulatory Visit
Admission: RE | Admit: 2022-08-04 | Discharge: 2022-08-04 | Disposition: A | Discharge status: Home / Self Care | Payer: Medicare HMO | Source: Ambulatory Visit | Attending: Radiation Oncology | Admitting: Radiation Oncology

## 2022-08-04 ENCOUNTER — Other Ambulatory Visit (HOSPITAL_COMMUNITY): Payer: Self-pay

## 2022-08-04 ENCOUNTER — Inpatient Hospital Stay: Payer: Medicare HMO

## 2022-08-04 DIAGNOSIS — C772 Secondary and unspecified malignant neoplasm of intra-abdominal lymph nodes: Secondary | ICD-10-CM | POA: Diagnosis not present

## 2022-08-04 DIAGNOSIS — Z510 Encounter for antineoplastic radiation therapy: Secondary | ICD-10-CM | POA: Diagnosis not present

## 2022-08-04 DIAGNOSIS — Z5112 Encounter for antineoplastic immunotherapy: Secondary | ICD-10-CM | POA: Diagnosis not present

## 2022-08-04 DIAGNOSIS — Z5111 Encounter for antineoplastic chemotherapy: Secondary | ICD-10-CM | POA: Diagnosis not present

## 2022-08-04 DIAGNOSIS — C189 Malignant neoplasm of colon, unspecified: Secondary | ICD-10-CM

## 2022-08-04 DIAGNOSIS — C778 Secondary and unspecified malignant neoplasm of lymph nodes of multiple regions: Secondary | ICD-10-CM | POA: Diagnosis not present

## 2022-08-04 LAB — RAD ONC ARIA SESSION SUMMARY
Course Elapsed Days: 13
Plan Fractions Treated to Date: 10
Plan Prescribed Dose Per Fraction: 2 Gy
Plan Total Fractions Prescribed: 20
Plan Total Prescribed Dose: 40 Gy
Reference Point Dosage Given to Date: 20 Gy
Reference Point Session Dosage Given: 2 Gy
Session Number: 10

## 2022-08-04 LAB — CBC
HCT: 39.3 % (ref 39.0–52.0)
Hemoglobin: 13.3 g/dL (ref 13.0–17.0)
MCH: 32.9 pg (ref 26.0–34.0)
MCHC: 33.8 g/dL (ref 30.0–36.0)
MCV: 97.3 fL (ref 80.0–100.0)
Platelets: 144 10*3/uL — ABNORMAL LOW (ref 150–400)
RBC: 4.04 MIL/uL — ABNORMAL LOW (ref 4.22–5.81)
RDW: 15.2 % (ref 11.5–15.5)
WBC: 5.8 10*3/uL (ref 4.0–10.5)
nRBC: 0 % (ref 0.0–0.2)

## 2022-08-05 ENCOUNTER — Ambulatory Visit
Admission: RE | Admit: 2022-08-05 | Discharge: 2022-08-05 | Disposition: A | Discharge status: Home / Self Care | Payer: Medicare HMO | Source: Ambulatory Visit | Attending: Radiation Oncology | Admitting: Radiation Oncology

## 2022-08-05 ENCOUNTER — Other Ambulatory Visit: Payer: Self-pay

## 2022-08-05 DIAGNOSIS — Z510 Encounter for antineoplastic radiation therapy: Secondary | ICD-10-CM | POA: Diagnosis not present

## 2022-08-05 DIAGNOSIS — Z5111 Encounter for antineoplastic chemotherapy: Secondary | ICD-10-CM | POA: Diagnosis not present

## 2022-08-05 DIAGNOSIS — C778 Secondary and unspecified malignant neoplasm of lymph nodes of multiple regions: Secondary | ICD-10-CM | POA: Diagnosis not present

## 2022-08-05 DIAGNOSIS — C772 Secondary and unspecified malignant neoplasm of intra-abdominal lymph nodes: Secondary | ICD-10-CM | POA: Diagnosis not present

## 2022-08-05 DIAGNOSIS — C189 Malignant neoplasm of colon, unspecified: Secondary | ICD-10-CM | POA: Diagnosis not present

## 2022-08-05 DIAGNOSIS — Z5112 Encounter for antineoplastic immunotherapy: Secondary | ICD-10-CM | POA: Diagnosis not present

## 2022-08-05 LAB — RAD ONC ARIA SESSION SUMMARY
Course Elapsed Days: 14
Plan Fractions Treated to Date: 11
Plan Prescribed Dose Per Fraction: 2 Gy
Plan Total Fractions Prescribed: 20
Plan Total Prescribed Dose: 40 Gy
Reference Point Dosage Given to Date: 22 Gy
Reference Point Session Dosage Given: 2 Gy
Session Number: 11

## 2022-08-06 ENCOUNTER — Ambulatory Visit
Admission: RE | Admit: 2022-08-06 | Discharge: 2022-08-06 | Disposition: A | Discharge status: Home / Self Care | Payer: Medicare HMO | Source: Ambulatory Visit | Attending: Radiation Oncology | Admitting: Radiation Oncology

## 2022-08-06 ENCOUNTER — Other Ambulatory Visit: Payer: Self-pay

## 2022-08-06 DIAGNOSIS — C189 Malignant neoplasm of colon, unspecified: Secondary | ICD-10-CM

## 2022-08-06 DIAGNOSIS — Z5111 Encounter for antineoplastic chemotherapy: Secondary | ICD-10-CM | POA: Diagnosis not present

## 2022-08-06 DIAGNOSIS — Z5112 Encounter for antineoplastic immunotherapy: Secondary | ICD-10-CM | POA: Diagnosis not present

## 2022-08-06 DIAGNOSIS — Z510 Encounter for antineoplastic radiation therapy: Secondary | ICD-10-CM | POA: Diagnosis not present

## 2022-08-06 DIAGNOSIS — C778 Secondary and unspecified malignant neoplasm of lymph nodes of multiple regions: Secondary | ICD-10-CM | POA: Diagnosis not present

## 2022-08-06 DIAGNOSIS — C772 Secondary and unspecified malignant neoplasm of intra-abdominal lymph nodes: Secondary | ICD-10-CM | POA: Diagnosis not present

## 2022-08-06 LAB — RAD ONC ARIA SESSION SUMMARY
Course Elapsed Days: 15
Plan Fractions Treated to Date: 12
Plan Prescribed Dose Per Fraction: 2 Gy
Plan Total Fractions Prescribed: 20
Plan Total Prescribed Dose: 40 Gy
Reference Point Dosage Given to Date: 24 Gy
Reference Point Session Dosage Given: 2 Gy
Session Number: 12

## 2022-08-06 MED FILL — Dexamethasone Sodium Phosphate Inj 100 MG/10ML: INTRAMUSCULAR | Qty: 1 | Status: AC

## 2022-08-09 ENCOUNTER — Inpatient Hospital Stay (HOSPITAL_BASED_OUTPATIENT_CLINIC_OR_DEPARTMENT_OTHER): Payer: Medicare HMO | Admitting: Oncology

## 2022-08-09 ENCOUNTER — Inpatient Hospital Stay: Payer: Medicare HMO

## 2022-08-09 ENCOUNTER — Ambulatory Visit
Admission: RE | Admit: 2022-08-09 | Discharge: 2022-08-09 | Disposition: A | Discharge status: Home / Self Care | Payer: Medicare HMO | Source: Ambulatory Visit | Attending: Radiation Oncology | Admitting: Radiation Oncology

## 2022-08-09 ENCOUNTER — Other Ambulatory Visit: Payer: Self-pay

## 2022-08-09 ENCOUNTER — Encounter: Payer: Self-pay | Admitting: Oncology

## 2022-08-09 ENCOUNTER — Other Ambulatory Visit: Payer: Self-pay | Admitting: *Deleted

## 2022-08-09 VITALS — BP 110/64 | HR 73 | Temp 97.7°F | Resp 18 | Wt 156.5 lb

## 2022-08-09 DIAGNOSIS — Z5112 Encounter for antineoplastic immunotherapy: Secondary | ICD-10-CM | POA: Diagnosis not present

## 2022-08-09 DIAGNOSIS — Z79899 Other long term (current) drug therapy: Secondary | ICD-10-CM

## 2022-08-09 DIAGNOSIS — C189 Malignant neoplasm of colon, unspecified: Secondary | ICD-10-CM

## 2022-08-09 DIAGNOSIS — Z510 Encounter for antineoplastic radiation therapy: Secondary | ICD-10-CM | POA: Diagnosis not present

## 2022-08-09 DIAGNOSIS — Z5111 Encounter for antineoplastic chemotherapy: Secondary | ICD-10-CM | POA: Diagnosis not present

## 2022-08-09 DIAGNOSIS — C772 Secondary and unspecified malignant neoplasm of intra-abdominal lymph nodes: Secondary | ICD-10-CM

## 2022-08-09 DIAGNOSIS — C778 Secondary and unspecified malignant neoplasm of lymph nodes of multiple regions: Secondary | ICD-10-CM | POA: Diagnosis not present

## 2022-08-09 LAB — RAD ONC ARIA SESSION SUMMARY
Course Elapsed Days: 18
Plan Fractions Treated to Date: 13
Plan Prescribed Dose Per Fraction: 2 Gy
Plan Total Fractions Prescribed: 20
Plan Total Prescribed Dose: 40 Gy
Reference Point Dosage Given to Date: 26 Gy
Reference Point Session Dosage Given: 2 Gy
Session Number: 13

## 2022-08-09 LAB — COMPREHENSIVE METABOLIC PANEL
ALT: 19 U/L (ref 0–44)
AST: 29 U/L (ref 15–41)
Albumin: 3.6 g/dL (ref 3.5–5.0)
Alkaline Phosphatase: 126 U/L (ref 38–126)
Anion gap: 7 (ref 5–15)
BUN: 26 mg/dL — ABNORMAL HIGH (ref 8–23)
CO2: 23 mmol/L (ref 22–32)
Calcium: 8.5 mg/dL — ABNORMAL LOW (ref 8.9–10.3)
Chloride: 103 mmol/L (ref 98–111)
Creatinine, Ser: 0.97 mg/dL (ref 0.61–1.24)
GFR, Estimated: >60 mL/min (ref >60)
Glucose, Bld: 94 mg/dL (ref 70–99)
Potassium: 3.4 mmol/L — ABNORMAL LOW (ref 3.5–5.1)
Sodium: 133 mmol/L — ABNORMAL LOW (ref 135–145)
Total Bilirubin: 0.9 mg/dL (ref 0.3–1.2)
Total Protein: 7 g/dL (ref 6.5–8.1)

## 2022-08-09 LAB — CBC WITH DIFFERENTIAL/PLATELET
Abs Immature Granulocytes: 0.04 10*3/uL (ref 0.00–0.07)
Basophils Absolute: 0 10*3/uL (ref 0.0–0.1)
Basophils Relative: 1 %
Eosinophils Absolute: 0.4 10*3/uL (ref 0.0–0.5)
Eosinophils Relative: 6 %
HCT: 36.3 % — ABNORMAL LOW (ref 39.0–52.0)
Hemoglobin: 12.8 g/dL — ABNORMAL LOW (ref 13.0–17.0)
Immature Granulocytes: 1 %
Lymphocytes Relative: 15 %
Lymphs Abs: 0.9 10*3/uL (ref 0.7–4.0)
MCH: 33.1 pg (ref 26.0–34.0)
MCHC: 35.3 g/dL (ref 30.0–36.0)
MCV: 93.8 fL (ref 80.0–100.0)
Monocytes Absolute: 0.5 10*3/uL (ref 0.1–1.0)
Monocytes Relative: 9 %
Neutro Abs: 4.4 10*3/uL (ref 1.7–7.7)
Neutrophils Relative %: 68 %
Platelets: 125 10*3/uL — ABNORMAL LOW (ref 150–400)
RBC: 3.87 MIL/uL — ABNORMAL LOW (ref 4.22–5.81)
RDW: 14.9 % (ref 11.5–15.5)
WBC: 6.3 10*3/uL (ref 4.0–10.5)
nRBC: 0 % (ref 0.0–0.2)

## 2022-08-09 LAB — MAGNESIUM: Magnesium: 1.9 mg/dL (ref 1.7–2.4)

## 2022-08-09 MED ORDER — HEPARIN SOD (PORK) LOCK FLUSH 100 UNIT/ML IV SOLN
500.0000 [IU] | Freq: Once | INTRAVENOUS | Status: AC | PRN
  Filled 2022-08-09: qty 5

## 2022-08-09 MED ORDER — PALONOSETRON HCL INJECTION 0.25 MG/5ML
0.2500 mg | Freq: Once | INTRAVENOUS | Status: AC
  Administered 2022-08-09: 0.25 mg via INTRAVENOUS
  Filled 2022-08-09: qty 5

## 2022-08-09 MED ORDER — SODIUM CHLORIDE 0.9 % IV SOLN
125.0000 mg/m2 | Freq: Once | INTRAVENOUS | Status: AC
  Administered 2022-08-09: 220 mg via INTRAVENOUS
  Filled 2022-08-09: qty 10

## 2022-08-09 MED ORDER — SODIUM CHLORIDE 0.9 % IV SOLN
6.0000 mg/kg | Freq: Once | INTRAVENOUS | Status: AC
  Administered 2022-08-09: 400 mg via INTRAVENOUS
  Filled 2022-08-09: qty 20

## 2022-08-09 MED ORDER — ATROPINE SULFATE 1 MG/ML IV SOLN
0.5000 mg | Freq: Once | INTRAVENOUS | Status: AC | PRN
  Administered 2022-08-09: 0.5 mg via INTRAVENOUS
  Filled 2022-08-09: qty 1

## 2022-08-09 MED ORDER — SODIUM CHLORIDE 0.9% FLUSH
10.0000 mL | INTRAVENOUS | Status: DC | PRN
  Administered 2022-08-09: 10 mL via INTRAVENOUS
  Filled 2022-08-09: qty 10

## 2022-08-09 MED ORDER — SODIUM CHLORIDE 0.9% FLUSH
10.0000 mL | INTRAVENOUS | Status: DC | PRN
  Administered 2022-08-09: 10 mL
  Filled 2022-08-09: qty 10

## 2022-08-09 MED ORDER — SODIUM CHLORIDE 0.9 % IV SOLN
10.0000 mg | Freq: Once | INTRAVENOUS | Status: AC
  Administered 2022-08-09: 10 mg via INTRAVENOUS
  Filled 2022-08-09: qty 1

## 2022-08-09 MED ORDER — SODIUM CHLORIDE 0.9 % IV SOLN
Freq: Once | INTRAVENOUS | Status: AC
  Filled 2022-08-09: qty 250

## 2022-08-09 MED ORDER — HEPARIN SOD (PORK) LOCK FLUSH 100 UNIT/ML IV SOLN
INTRAVENOUS | Status: AC
  Administered 2022-08-09: 500 [IU]
  Filled 2022-08-09: qty 5

## 2022-08-09 NOTE — Progress Notes (Signed)
ON PATHWAY REGIMEN - Colorectal  No Change  Continue With Treatment as Ordered.  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

## 2022-08-09 NOTE — Patient Instructions (Signed)
[facility] AT [place]  Discharge Instructions: Thank you for choosing [facility] to provide your oncology and hematology care.  If you have a lab appointment with the [facility], please go directly to the [facility] and check in at the registration area.  Wear comfortable clothing and clothing appropriate for easy access to any Portacath or PICC line.   We strive to give you quality time with your provider. You may need to reschedule your appointment if you arrive late (15 or more minutes).  Arriving late affects you and other patients whose appointments are after yours.  Also, if you miss three or more appointments without notifying the office, you may be dismissed from the clinic at the provider's discretion.      For prescription refill requests, have your pharmacy contact our office and allow 72 hours for refills to be completed.    Today you received the following chemotherapy and/or immunotherapy agents irrinotecan, panitumumab      To help prevent nausea and vomiting after your treatment, we encourage you to take your nausea medication as directed.  BELOW ARE SYMPTOMS THAT SHOULD BE REPORTED IMMEDIATELY: *FEVER GREATER THAN 100.4 F (38 C) OR HIGHER *CHILLS OR SWEATING *NAUSEA AND VOMITING THAT IS NOT CONTROLLED WITH YOUR NAUSEA MEDICATION *UNUSUAL SHORTNESS OF BREATH *UNUSUAL BRUISING OR BLEEDING *URINARY PROBLEMS (pain or burning when urinating, or frequent urination) *BOWEL PROBLEMS (unusual diarrhea, constipation, pain near the anus) TENDERNESS IN MOUTH AND THROAT WITH OR WITHOUT PRESENCE OF ULCERS (sore throat, sores in mouth, or a toothache) UNUSUAL RASH, SWELLING OR PAIN  UNUSUAL VAGINAL DISCHARGE OR ITCHING   Items with * indicate a potential emergency and should be followed up as soon as possible or go to the Emergency Department if any problems should occur.  Please show the CHEMOTHERAPY ALERT CARD or IMMUNOTHERAPY ALERT CARD  at check-in to the Emergency Department and triage nurse.  Should you have questions after your visit or need to cancel or reschedule your appointment, please contact [facility] [place] AT [place]  [phone] and follow the prompts.  Office hours are 8:00 a.m. to 4:30 p.m. Monday - Friday. Please note that voicemails left after 4:00 p.m. may not be returned until the following business day.  We are closed weekends and major holidays. You have access to a nurse at all times for urgent questions. Please call the main number to the clinic [phone] and follow the prompts.  For any non-urgent questions, you may also contact your provider using MyChart. We now offer e-Visits for anyone 81 and older to request care online for non-urgent symptoms. For details visit [email or web address].   Also download the MyChart app! Go to the app store, search "MyChart", open the app, select [facility], and log in with your MyChart username and password.  Masks are optional in the cancer centers. If you would like for your care team to wear a mask while they are taking care of you, please let them know. For doctor visits, patients may have with them one support person who is at least 73 years old. At this time, visitors are not allowed in the infusion area.  Panitumumab Injection What is this medication? PANITUMUMAB (pan i TOOM ue mab) treats colorectal cancer. It works by blocking a protein that causes cancer cells to grow and multiply. This helps to slow or stop the spread of cancer cells. It is a monoclonal antibody. This medicine may be used for other purposes; ask your  health care provider or pharmacist if you have questions. COMMON BRAND NAME(S): Vectibix What should I tell my care team before I take this medication? They need to know if you have any of these conditions: Eye disease Low levels of magnesium in the blood Lung disease An unusual or allergic reaction to panitumumab, other  medications, foods, dyes, or preservatives Pregnant or trying to get pregnant Breast-feeding How should I use this medication? This medication is injected into a vein. It is given by your care team in a hospital or clinic setting. Talk to your care team about the use of this medication in children. Special care may be needed. Overdosage: If you think you have taken too much of this medicine contact a poison control center or emergency room at once. NOTE: This medicine is only for you. Do not share this medicine with others. What if I miss a dose? Keep appointments for follow-up doses. It is important not to miss your dose. Call your care team if you are unable to keep an appointment. What may interact with this medication? Bevacizumab This list may not describe all possible interactions. Give your health care provider a list of all the medicines, herbs, non-prescription drugs, or dietary supplements you use. Also tell them if you smoke, drink alcohol, or use illegal drugs. Some items may interact with your medicine. What should I watch for while using this medication? Your condition will be monitored carefully while you are receiving this medication. This medication may make you feel generally unwell. This is not uncommon as chemotherapy can affect healthy cells as well as cancer cells. Report any side effects. Continue your course of treatment even though you feel ill unless your care team tells you to stop. This medication can make you more sensitive to the sun. Keep out of the sun while receiving this medication and for 2 months after stopping therapy. If you cannot avoid being in the sun, wear protective clothing and sunscreen. Do not use sun lamps, tanning beds, or tanning booths. Check with your care team if you have severe diarrhea, nausea, and vomiting or if you sweat a lot. The loss of too much body fluid may make it dangerous for you to take this medication. This medication may cause  serious skin reactions. They can happen weeks to months after starting the medication. Contact your care team right away if you notice fevers or flu-like symptoms with a rash. The rash may be red or purple and then turn into blisters or peeling of the skin. You may also notice a red rash with swelling of the face, lips, or lymph nodes in your neck or under your arms. Talk to your care team if you may be pregnant. Serious birth defects can occur if you take this medication during pregnancy and for 2 months after the last dose. Contraception is recommended while taking this medication and for 2 months after the last dose. Your care team can help you find the option that works for you. Do not breastfeed while taking this medication and for 2 months after the last dose. This medication may cause infertility. Talk to your care team if you are concerned about your fertility. What side effects may I notice from receiving this medication? Side effects that you should report to your care team as soon as possible: Allergic reactions--skin rash, itching, hives, swelling of the face, lips, tongue, or throat Dry cough, shortness of breath or trouble breathing Eye pain, redness, irritation, or discharge with blurry  or decreased vision Infusion reactions--chest pain, shortness of breath or trouble breathing, feeling faint or lightheaded Low magnesium level--muscle pain or cramps, unusual weakness or fatigue, fast or irregular heartbeat, tremors Low potassium level--muscle pain or cramps, unusual weakness or fatigue, fast or irregular heartbeat, constipation Redness, blistering, peeling, or loosening of the skin, including inside the mouth Skin reactions on sun-exposed areas Side effects that usually do not require medical attention (report to your care team if they continue or are bothersome): Change in nail shape, thickness, or color Diarrhea Dry skin Fatigue Nausea Vomiting This list may not describe all  possible side effects. Call your doctor for medical advice about side effects. You may report side effects to FDA at 1-800-FDA-1088. Where should I keep my medication? This medication is given in a hospital or clinic. It will not be stored at home. NOTE: This sheet is a summary. It may not cover all possible information. If you have questions about this medicine, talk to your doctor, pharmacist, or health care provider.  2023 Elsevier/Gold Standard (2022-03-24 00:00:00)  Irinotecan Injection What is this medication? IRINOTECAN (ir in oh TEE kan) treats some types of cancer. It works by slowing down the growth of cancer cells. This medicine may be used for other purposes; ask your health care provider or pharmacist if you have questions. COMMON BRAND NAME(S): Camptosar What should I tell my care team before I take this medication? They need to know if you have any of these conditions: Dehydration Diarrhea Infection, especially a viral infection, such as chickenpox, cold sores, herpes Liver disease Low blood cell levels (white cells, red cells, and platelets) Low levels of electrolytes, such as calcium, magnesium, or potassium in your blood Recent or ongoing radiation An unusual or allergic reaction to irinotecan, other medications, foods, dyes, or preservatives If you or your partner are pregnant or trying to get pregnant Breast-feeding How should I use this medication? This medication is injected into a vein. It is given by your care team in a hospital or clinic setting. Talk to your care team about the use of this medication in children. Special care may be needed. Overdosage: If you think you have taken too much of this medicine contact a poison control center or emergency room at once. NOTE: This medicine is only for you. Do not share this medicine with others. What if I miss a dose? Keep appointments for follow-up doses. It is important not to miss your dose. Call your care team if  you are unable to keep an appointment. What may interact with this medication? Do not take this medication with any of the following: Cobicistat Itraconazole This medication may also interact with the following: Certain antibiotics, such as clarithromycin, rifampin, rifabutin Certain antivirals for HIV or AIDS Certain medications for fungal infections, such as ketoconazole, posaconazole, voriconazole Certain medications for seizures, such as carbamazepine, phenobarbital, phenytoin Gemfibrozil Nefazodone St. John's wort This list may not describe all possible interactions. Give your health care provider a list of all the medicines, herbs, non-prescription drugs, or dietary supplements you use. Also tell them if you smoke, drink alcohol, or use illegal drugs. Some items may interact with your medicine. What should I watch for while using this medication? Your condition will be monitored carefully while you are receiving this medication. You may need blood work while taking this medication. This medication may make you feel generally unwell. This is not uncommon as chemotherapy can affect healthy cells as well as cancer cells. Report any side  effects. Continue your course of treatment even though you feel ill unless your care team tells you to stop. This medication can cause serious side effects. To reduce the risk, your care team may give you other medications to take before receiving this one. Be sure to follow the directions from your care team. This medication may affect your coordination, reaction time, or judgement. Do not drive or operate machinery until you know how this medication affects you. Sit up or stand slowly to reduce the risk of dizzy or fainting spells. Drinking alcohol with this medication can increase the risk of these side effects. This medication may increase your risk of getting an infection. Call your care team for advice if you get a fever, chills, sore throat, or other  symptoms of a cold or flu. Do not treat yourself. Try to avoid being around people who are sick. Avoid taking medications that contain aspirin, acetaminophen, ibuprofen, naproxen, or ketoprofen unless instructed by your care team. These medications may hide a fever. This medication may increase your risk to bruise or bleed. Call your care team if you notice any unusual bleeding. Be careful brushing or flossing your teeth or using a toothpick because you may get an infection or bleed more easily. If you have any dental work done, tell your dentist you are receiving this medication. Talk to your care team if you or your partner are pregnant or think either of you might be pregnant. This medication can cause serious birth defects if taken during pregnancy and for 6 months after the last dose. You will need a negative pregnancy test before starting this medication. Contraception is recommended while taking this medication and for 6 months after the last dose. Your care team can help you find the option that works for you. Do not father a child while taking this medication and for 3 months after the last dose. Use a condom for contraception during this time period. Do not breastfeed while taking this medication and for 7 days after the last dose. This medication may cause infertility. Talk to your care team if you are concerned about your fertility. What side effects may I notice from receiving this medication? Side effects that you should report to your care team as soon as possible: Allergic reactions--skin rash, itching, hives, swelling of the face, lips, tongue, or throat Dry cough, shortness of breath or trouble breathing Increased saliva or tears, increased sweating, stomach cramping, diarrhea, small pupils, unusual weakness or fatigue, slow heartbeat Infection--fever, chills, cough, sore throat, wounds that don't heal, pain or trouble when passing urine, general feeling of discomfort or being  unwell Kidney injury--decrease in the amount of urine, swelling of the ankles, hands, or feet Low red blood cell level--unusual weakness or fatigue, dizziness, headache, trouble breathing Severe or prolonged diarrhea Unusual bruising or bleeding Side effects that usually do not require medical attention (report to your care team if they continue or are bothersome): Constipation Diarrhea Hair loss Loss of appetite Nausea Stomach pain This list may not describe all possible side effects. Call your doctor for medical advice about side effects. You may report side effects to FDA at 1-800-FDA-1088. Where should I keep my medication? This medication is given in a hospital or clinic. It will not be stored at home. NOTE: This sheet is a summary. It may not cover all possible information. If you have questions about this medicine, talk to your doctor, pharmacist, or health care provider.  2023 Elsevier/Gold Standard (2022-04-08 00:00:00)

## 2022-08-09 NOTE — Progress Notes (Signed)
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 He currently smokes 0.5ppd. he presented to the ER with symptoms of left sided chest pain and left arm pain. Troponin was negative ekg was unremarkable. Ct chest showed no PE. He was incidentally noted to have mediastinal and retrocrural adenopathy and a 2.1X2.3X4.4 cm retroaortic soft tissue lesion in the posterior left chest. 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  Low-grade activity in the left lateral fifth and sixth ribs associated with nondisplaced fractures likely reflecting healing response problem malignancy.   Patient underwent CT-guided biopsy of the retroperitoneal lymph node pathology showed metastatic adenocarcinoma compatible with colorectal origin.  CK7 negative.  CK20 positive.  CDX 2+.  TTF-1 negative.  PSA negative.  This pattern of immunoreactivity supports the above diagnosis. Patient underwent colonoscopy which showed sigmoid mass that was consistent with adenocarcinoma.  RAS panel testing showed that he was wild-type for both K-ras and BRAF   FOLFOX and bevacizumab chemotherapy started in July 2020.  Subsequently oxaliplatin has been on hold given neuropathy.  Recent scans  have shown slow increase in the size of intra-abdominal adenopathy but patient continues to have low-volume disease.plan is to switch him to second line Xeloda irinotecan panitumumab  regimen  Interval history- patient is doing well. Occasional nausea. Self limited. He is left with 5 more days of radiation. Tolerating chemo with xeloda well without significant side effects  ECOG PS- 1 Pain scale- 0 Opioid associated constipation- 0  Review of systems- Review of Systems  Constitutional:  Negative for chills, fever, malaise/fatigue and weight loss.  HENT:  Negative for congestion, ear discharge and nosebleeds.   Eyes:  Negative for blurred vision.  Respiratory:  Negative for cough, hemoptysis, sputum production, shortness of breath and wheezing.   Cardiovascular:  Negative for chest pain, palpitations, orthopnea and claudication.  Gastrointestinal:  Positive for nausea. Negative for abdominal pain, blood in stool, constipation, diarrhea, heartburn, melena and vomiting.  Genitourinary:  Negative for dysuria, flank pain, frequency, hematuria and urgency.  Musculoskeletal:  Negative for back pain, joint pain and myalgias.  Skin:  Negative for rash.  Neurological:  Negative for dizziness, tingling, focal weakness, seizures, weakness and headaches.  Endo/Heme/Allergies:  Does not bruise/bleed easily.  Psychiatric/Behavioral:  Negative for depression and suicidal ideas. The patient does not have insomnia.       No Known Allergies   Past Medical History:  Diagnosis Date   Colon cancer ([place])    COPD (chronic obstructive pulmonary disease) ([place])    Hyperlipidemia      Past Surgical History:  Procedure Laterality Date   COLONOSCOPY WITH PROPOFOL N/A 06/26/2019   Procedure: COLONOSCOPY WITH PROPOFOL;  Surgeon: [name], MD;  Location: [facility] ENDOSCOPY;  Service: Gastroenterology;  Laterality: N/A;   FLEXIBLE SIGMOIDOSCOPY N/A 05/07/2021   Procedure: FLEXIBLE  SIGMOIDOSCOPY;  Surgeon: [name], MD;  Location: [facility] ENDOSCOPY;  Service: Gastroenterology;  Laterality: N/A;   PORTA CATH INSERTION N/A 06/25/2019   Procedure: PORTA CATH INSERTION;  Surgeon: Algernon Huxley, MD;  Location: [place] CV LAB;  Service: Cardiovascular;  Laterality: N/A;   PORTA CATH INSERTION Left 08/11/2020   Procedure: PORTA CATH INSERTION;  Surgeon: Algernon Huxley, MD;  Location: [place] CV LAB;  Service: Cardiovascular;  Laterality: Left;   PORTA CATH REMOVAL Right 08/11/2020   Procedure: PORTA CATH REMOVAL;  Surgeon: Algernon Huxley, MD;  Location: [place] CV LAB;  Service: Cardiovascular;  Laterality: Right;    Social History   Socioeconomic History   Marital status: Single    Spouse name: Not on file   Number of children: Not on file   Years of education: Not on file   Highest education level: Not on file  Occupational History   Not on file  Tobacco Use   Smoking status: Every Day    Packs/day: 0.50    Years: 40.00    Total pack years: 20.00    Types: Cigarettes   Smokeless tobacco: Never   Tobacco comments:    smoking 40 years  Vaping Use   Vaping Use: Never used  Substance and Sexual Activity   Alcohol use: No   Drug use: No   Sexual activity: Not Currently  Other Topics Concern   Not on file  Social History Narrative   Not on file   Social Determinants of Health   Financial Resource Strain: Low Risk  (06/12/2019)   Overall Financial Resource Strain (CARDIA)    Difficulty of Paying Living Expenses: Not hard at all  Food Insecurity: No Food Insecurity (06/12/2019)   Hunger Vital Sign    Worried About Running Out of Food in the Last Year: Never true    Ran Out of Food in the Last Year: Never true  Transportation Needs: No Transportation Needs (06/12/2019)   PRAPARE - Hydrologist (Medical): No    Lack of Transportation (Non-Medical): No  Physical Activity: Not on file  Stress: No Stress Concern Present (06/12/2019)   [place]    Feeling of Stress : Not at all  Social Connections: Unknown (06/12/2019)   Social Connection and Isolation Panel [NHANES]    Frequency of Communication with Friends and Family: More than three times a week    Frequency of Social Gatherings with Friends and Family: Not on file    Attends Religious Services: Not on file    Active Member of [place] or Organizations: Not on file    Attends Archivist Meetings: Not on file    Marital Status: Not on file  Intimate Partner Violence: Not At Risk (06/12/2019)   Humiliation, Afraid, Rape, and Kick questionnaire    Fear of Current or Ex-Partner: No    Emotionally Abused: No    Physically Abused: No    Sexually Abused: No    Family History  Problem Relation Age of Onset   Brain cancer Father      Current Outpatient Medications:    allopurinol (ZYLOPRIM) 100 MG tablet, Take 100 mg by mouth daily., Disp: , Rfl:    aspirin EC 81 MG tablet, Take 81 mg by mouth daily., Disp: , Rfl:    azelastine (ASTELIN) 0.1 % nasal spray, Place 1 spray into both nostrils daily as needed., Disp: , Rfl:  butalbital-acetaminophen-caffeine (FIORICET) 50-325-40 MG tablet, Take 1 tablet by mouth every 4 (four) hours as needed., Disp: , Rfl:    capecitabine (XELODA) 500 MG tablet, Take 1 tablet (500 mg total) by mouth 2 (two) times daily after a meal. Take for 14 days, then hold for 7 days. Repeat every 21 days., Disp: 28 tablet, Rfl: 0   cetirizine (ZYRTEC) 10 MG tablet, Take 1 tablet (10 mg total) by mouth daily., Disp: 30 tablet, Rfl: 0   chlorthalidone (HYGROTON) 25 MG tablet, Take 1 tablet by mouth daily., Disp: , Rfl:    cyclobenzaprine (FLEXERIL) 10 MG tablet, TAKE 1 TABLET(10 MG) BY MOUTH THREE TIMES DAILY AS NEEDED FOR MUSCLE SPASMS, Disp: 42 tablet, Rfl: 0   DEXILANT 60 MG capsule, Take 60 mg by mouth daily., Disp: , Rfl:    DULoxetine (CYMBALTA) 30 MG capsule, Take 1 capsule (30 mg total) by  mouth 2 (two) times daily., Disp: 60 capsule, Rfl: 1   fluticasone (FLONASE) 50 MCG/ACT nasal spray, Place 2 sprays into both nostrils daily as needed., Disp: , Rfl:    lidocaine-prilocaine (EMLA) cream, Apply 1 application  topically as needed (pt will put 1 inch of cream over port 1 hourfor each treatment, cover plastic over the cream protect clothes)., Disp: 30 g, Rfl: 3   lisinopril (ZESTRIL) 5 MG tablet, Take 5 mg by mouth daily., Disp: , Rfl:    montelukast (SINGULAIR) 10 MG tablet, Take 10 mg by mouth at bedtime., Disp: , Rfl:    NEXLETOL 180 MG TABS, Take 1 tablet by mouth daily., Disp: , Rfl:    pregabalin (LYRICA) 75 MG capsule, TAKE 1 CAPSULE(75 MG) BY MOUTH TWICE DAILY, Disp: 60 capsule, Rfl: 0   simvastatin (ZOCOR) 20 MG tablet, Take 20 mg by mouth daily. , Disp: , Rfl:    SYMBICORT 80-4.5 MCG/ACT inhaler, Inhale 2 puffs into the lungs daily., Disp: , Rfl:    tamsulosin (FLOMAX) 0.4 MG CAPS capsule, Take 0.4 mg by mouth. , Disp: , Rfl:    magic mouthwash (multi-ingredient) oral suspension, Swish and spit 5-10 mls by mouth 4 times a day as needed (Patient not taking: Reported on 04/05/2022), Disp: 480 mL, Rfl: 3   oxyCODONE (OXY IR/ROXICODONE) 5 MG immediate release tablet, Take 1 tablet (5 mg total) by mouth every 6 (six) hours as needed for severe pain. (Patient not taking: Reported on 04/05/2022), Disp: 30 tablet, Rfl: 0 No current facility-administered medications for this visit.  Facility-Administered Medications Ordered in Other Visits:    heparin lock flush 100 UNIT/ML injection, , , ,    heparin lock flush 100 UNIT/ML injection, , , ,    heparin lock flush 100 unit/mL, 500 Units, Intracatheter, Once PRN, Sindy Guadeloupe, MD   panitumumab (VECTIBIX) 400 mg in sodium chloride 0.9 % 100 mL chemo infusion, 6 mg/kg (Treatment Plan Recorded), Intravenous, Once, Sindy Guadeloupe, MD   sodium chloride flush (NS) 0.9 % injection 10 mL, 10 mL, Intravenous, PRN, Sindy Guadeloupe, MD, 10 mL at  08/09/22 0828   sodium chloride flush (NS) 0.9 % injection 10 mL, 10 mL, Intracatheter, PRN, Sindy Guadeloupe, MD  Physical exam:  Vitals:   08/09/22 0843  BP: 110/64  Pulse: 73  Resp: 18  Temp: 97.7 F (36.5 C)  SpO2: 97%  Weight: 156 lb 8 oz (71 kg)   Physical Exam Constitutional:      General: He is not in acute distress. Cardiovascular:     Rate and  Rhythm: Normal rate and regular rhythm.     Heart sounds: Normal heart sounds.  Pulmonary:     Effort: Pulmonary effort is normal.     Breath sounds: Normal breath sounds.  Abdominal:     General: Bowel sounds are normal.     Palpations: Abdomen is soft.  Skin:    General: Skin is warm and dry.  Neurological:     Mental Status: He is alert and oriented to person, place, and time.         Latest Ref Rng & Units 08/09/2022    8:28 AM  CMP  Glucose 70 - 99 mg/dL 94   BUN 8 - 23 mg/dL 26   Creatinine 0.61 - 1.24 mg/dL 0.97   Sodium 135 - 145 mmol/L 133   Potassium 3.5 - 5.1 mmol/L 3.4   Chloride 98 - 111 mmol/L 103   CO2 22 - 32 mmol/L 23   Calcium 8.9 - 10.3 mg/dL 8.5   Total Protein 6.5 - 8.1 g/dL 7.0   Total Bilirubin 0.3 - 1.2 mg/dL 0.9   Alkaline Phos 38 - 126 U/L 126   AST 15 - 41 U/L 29   ALT 0 - 44 U/L 19       Latest Ref Rng & Units 08/09/2022    8:28 AM  CBC  WBC 4.0 - 10.5 K/uL 6.3   Hemoglobin 13.0 - 17.0 g/dL 12.8   Hematocrit 39.0 - 52.0 % 36.3   Platelets 150 - 400 K/uL 125     No images are attached to the encounter.  NM PET Image Restag (PS) Skull Base To Thigh  Result Date: 07/13/2022 CLINICAL DATA:  Subsequent treatment strategy for metastatic colon cancer with ongoing chemotherapy most recently 1 month prior. EXAM: NUCLEAR MEDICINE PET SKULL BASE TO THIGH TECHNIQUE: Eight point mCi F-18 FDG was injected intravenously. Full-ring PET imaging was performed from the skull base to thigh after the radiotracer. CT data was obtained and used for attenuation correction and anatomic localization.  Fasting blood glucose: 98 mg/dl COMPARISON:  06/22/2022 CT abdomen/pelvis. 03/08/2022 CT chest, abdomen and pelvis. 06/06/2019 PET-CT. FINDINGS: Mediastinal blood pool activity: SUV max 1.5 Liver activity: SUV max NA NECK: No hypermetabolic lymph nodes in the neck. Incidental CT findings: Chronic right ocular prosthesis. Left internal jugular Port-A-Cath terminates at the cavoatrial junction. CHEST: No enlarged or hypermetabolic axillary, mediastinal or hilar lymph nodes. No hypermetabolic pulmonary findings. Incidental CT findings: Coronary atherosclerosis. Atherosclerotic nonaneurysmal thoracic aorta. Severe centrilobular emphysema. No significant pulmonary nodules. ABDOMEN/PELVIS: No abnormal hypermetabolic activity within the liver, pancreas, adrenal glands, or spleen. No enlarged or hypermetabolic lymph nodes in the abdomen or right pelvis. Mildly enlarged hypermetabolic 1.4 cm left internal iliac lymph node with max SUV 3.2 (series 2/image 197). Multiple enlarged hypermetabolic left external iliac lymph nodes, for example 2.6 cm with max SUV 3.2 (series 2/image 205) and 1.7 cm with max SUV 7.3 (series 2/image 215). Incidental CT findings: Mildly irregular liver surface, compatible with cirrhosis. Chronic mild splenomegaly. Atherosclerotic nonaneurysmal abdominal aorta. Moderate sigmoid diverticulosis. SKELETON: No focal hypermetabolic activity to suggest skeletal metastasis. Incidental CT findings: none IMPRESSION: 1. Hypermetabolic left internal and external iliac nodal metastases. 2. No additional sites of hypermetabolic metastatic disease. 3. Chronic findings include: Aortic Atherosclerosis (ICD10-I70.0) and Emphysema (ICD10-J43.9). Coronary atherosclerosis. Cirrhosis. Mild splenomegaly. Moderate sigmoid diverticulosis. Electronically Signed   By: Ilona Sorrel M.D.   On: 07/13/2022 09:57     Assessment and plan- Patient is a 71 y.o. male  with metastatic colon cancer and intra-abdominal metastases stage  IV. He is here for on treatment assessment prior to cycle 15 of panitumumab/ irinotecan  I have reviewed PET CT scan images independently and discussed findings with the patient.  Patient has oligometastatic disease and recent ct and pet showed disease mainly in his internal and external iliac nodes CEA is also trending up indicating that there is some progression on treatment with Xeloda panitumumab irinotecan regimen.  I have therefore referred him to radiation on [place] for palliative radiation to his external iliac lymph nodes which she is presently getting and will complete in 5 days time.  He is seems to be tolerating chemotherapy along with radiation well and therefore I am continuing Xeloda panitumumab irinotecan regimen at this time and today will be his cycle 15.  His last treatment was about 6 weeks ago.  I will see him back in 3 weeks for cycle 16.  I will see if he is over the next 4 to 6 weeks despite radiation treatment I will consider switching him to [facility]   Visit Diagnosis 1. 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 ([place])   2. High risk medication use   3. Encounter for antineoplastic chemotherapy   4. Encounter for monoclonal antibody treatment for malignancy      Dr. Randa Evens, MD, MPH [facility] [phone] 08/09/2022 12:09 PM

## 2022-08-09 NOTE — Progress Notes (Signed)
Per MD ok with treating vectibix based on last Mg level 7/18. Will obtain another lab today. Ok with continuing irinotecan dose of '125mg'$ /m2.

## 2022-08-10 ENCOUNTER — Other Ambulatory Visit: Payer: Self-pay

## 2022-08-10 ENCOUNTER — Ambulatory Visit
Admission: RE | Admit: 2022-08-10 | Discharge: 2022-08-10 | Disposition: A | Discharge status: Home / Self Care | Payer: Medicare HMO | Source: Ambulatory Visit | Attending: Radiation Oncology | Admitting: Radiation Oncology

## 2022-08-10 DIAGNOSIS — Z510 Encounter for antineoplastic radiation therapy: Secondary | ICD-10-CM | POA: Diagnosis not present

## 2022-08-10 DIAGNOSIS — Z5111 Encounter for antineoplastic chemotherapy: Secondary | ICD-10-CM | POA: Diagnosis not present

## 2022-08-10 DIAGNOSIS — Z5112 Encounter for antineoplastic immunotherapy: Secondary | ICD-10-CM | POA: Diagnosis not present

## 2022-08-10 DIAGNOSIS — C772 Secondary and unspecified malignant neoplasm of intra-abdominal lymph nodes: Secondary | ICD-10-CM | POA: Diagnosis not present

## 2022-08-10 DIAGNOSIS — C778 Secondary and unspecified malignant neoplasm of lymph nodes of multiple regions: Secondary | ICD-10-CM | POA: Diagnosis not present

## 2022-08-10 DIAGNOSIS — C189 Malignant neoplasm of colon, unspecified: Secondary | ICD-10-CM | POA: Diagnosis not present

## 2022-08-10 LAB — RAD ONC ARIA SESSION SUMMARY
Course Elapsed Days: 19
Plan Fractions Treated to Date: 14
Plan Prescribed Dose Per Fraction: 2 Gy
Plan Total Fractions Prescribed: 20
Plan Total Prescribed Dose: 40 Gy
Reference Point Dosage Given to Date: 28 Gy
Reference Point Session Dosage Given: 2 Gy
Session Number: 14

## 2022-08-10 LAB — CEA: CEA: 15.9 ng/mL — ABNORMAL HIGH (ref 0.0–4.7)

## 2022-08-11 ENCOUNTER — Other Ambulatory Visit: Payer: Self-pay

## 2022-08-11 ENCOUNTER — Ambulatory Visit
Admission: RE | Admit: 2022-08-11 | Discharge: 2022-08-11 | Disposition: A | Discharge status: Home / Self Care | Payer: Medicare HMO | Source: Ambulatory Visit | Attending: Radiation Oncology | Admitting: Radiation Oncology

## 2022-08-11 ENCOUNTER — Inpatient Hospital Stay: Payer: Medicare HMO

## 2022-08-11 DIAGNOSIS — Z5112 Encounter for antineoplastic immunotherapy: Secondary | ICD-10-CM | POA: Diagnosis not present

## 2022-08-11 DIAGNOSIS — C772 Secondary and unspecified malignant neoplasm of intra-abdominal lymph nodes: Secondary | ICD-10-CM | POA: Diagnosis not present

## 2022-08-11 DIAGNOSIS — Z510 Encounter for antineoplastic radiation therapy: Secondary | ICD-10-CM | POA: Diagnosis not present

## 2022-08-11 DIAGNOSIS — C778 Secondary and unspecified malignant neoplasm of lymph nodes of multiple regions: Secondary | ICD-10-CM | POA: Diagnosis not present

## 2022-08-11 DIAGNOSIS — C189 Malignant neoplasm of colon, unspecified: Secondary | ICD-10-CM | POA: Diagnosis not present

## 2022-08-11 DIAGNOSIS — Z5111 Encounter for antineoplastic chemotherapy: Secondary | ICD-10-CM | POA: Diagnosis not present

## 2022-08-11 LAB — RAD ONC ARIA SESSION SUMMARY
Course Elapsed Days: 20
Plan Fractions Treated to Date: 15
Plan Prescribed Dose Per Fraction: 2 Gy
Plan Total Fractions Prescribed: 20
Plan Total Prescribed Dose: 40 Gy
Reference Point Dosage Given to Date: 30 Gy
Reference Point Session Dosage Given: 2 Gy
Session Number: 15

## 2022-08-11 LAB — COMPREHENSIVE METABOLIC PANEL
ALT: 25 U/L (ref 0–44)
AST: 37 U/L (ref 15–41)
Albumin: 3.9 g/dL (ref 3.5–5.0)
Alkaline Phosphatase: 122 U/L (ref 38–126)
Anion gap: 7 (ref 5–15)
BUN: 39 mg/dL — ABNORMAL HIGH (ref 8–23)
CO2: 24 mmol/L (ref 22–32)
Calcium: 8.9 mg/dL (ref 8.9–10.3)
Chloride: 106 mmol/L (ref 98–111)
Creatinine, Ser: 1.1 mg/dL (ref 0.61–1.24)
GFR, Estimated: >60 mL/min (ref >60)
Glucose, Bld: 98 mg/dL (ref 70–99)
Potassium: 3.8 mmol/L (ref 3.5–5.1)
Sodium: 137 mmol/L (ref 135–145)
Total Bilirubin: 0.7 mg/dL (ref 0.3–1.2)
Total Protein: 7.6 g/dL (ref 6.5–8.1)

## 2022-08-11 LAB — CBC WITH DIFFERENTIAL/PLATELET
Abs Immature Granulocytes: 0.02 10*3/uL (ref 0.00–0.07)
Basophils Absolute: 0 10*3/uL (ref 0.0–0.1)
Basophils Relative: 1 %
Eosinophils Absolute: 0.4 10*3/uL (ref 0.0–0.5)
Eosinophils Relative: 7 %
HCT: 39.2 % (ref 39.0–52.0)
Hemoglobin: 13.5 g/dL (ref 13.0–17.0)
Immature Granulocytes: 0 %
Lymphocytes Relative: 18 %
Lymphs Abs: 1 10*3/uL (ref 0.7–4.0)
MCH: 33 pg (ref 26.0–34.0)
MCHC: 34.4 g/dL (ref 30.0–36.0)
MCV: 95.8 fL (ref 80.0–100.0)
Monocytes Absolute: 0.4 10*3/uL (ref 0.1–1.0)
Monocytes Relative: 7 %
Neutro Abs: 3.8 10*3/uL (ref 1.7–7.7)
Neutrophils Relative %: 67 %
Platelets: 127 10*3/uL — ABNORMAL LOW (ref 150–400)
RBC: 4.09 MIL/uL — ABNORMAL LOW (ref 4.22–5.81)
RDW: 15 % (ref 11.5–15.5)
WBC: 5.6 10*3/uL (ref 4.0–10.5)
nRBC: 0 % (ref 0.0–0.2)

## 2022-08-11 LAB — MAGNESIUM: Magnesium: 2.3 mg/dL (ref 1.7–2.4)

## 2022-08-12 ENCOUNTER — Ambulatory Visit
Admission: RE | Admit: 2022-08-12 | Discharge: 2022-08-12 | Disposition: A | Discharge status: Home / Self Care | Payer: Medicare HMO | Source: Ambulatory Visit | Attending: Radiation Oncology | Admitting: Radiation Oncology

## 2022-08-12 ENCOUNTER — Other Ambulatory Visit: Payer: Self-pay

## 2022-08-12 DIAGNOSIS — C772 Secondary and unspecified malignant neoplasm of intra-abdominal lymph nodes: Secondary | ICD-10-CM | POA: Diagnosis not present

## 2022-08-12 DIAGNOSIS — C778 Secondary and unspecified malignant neoplasm of lymph nodes of multiple regions: Secondary | ICD-10-CM | POA: Diagnosis not present

## 2022-08-12 DIAGNOSIS — Z5111 Encounter for antineoplastic chemotherapy: Secondary | ICD-10-CM | POA: Diagnosis not present

## 2022-08-12 DIAGNOSIS — Z5112 Encounter for antineoplastic immunotherapy: Secondary | ICD-10-CM | POA: Diagnosis not present

## 2022-08-12 DIAGNOSIS — C189 Malignant neoplasm of colon, unspecified: Secondary | ICD-10-CM | POA: Diagnosis not present

## 2022-08-12 DIAGNOSIS — Z510 Encounter for antineoplastic radiation therapy: Secondary | ICD-10-CM | POA: Diagnosis not present

## 2022-08-12 LAB — RAD ONC ARIA SESSION SUMMARY
Course Elapsed Days: 21
Plan Fractions Treated to Date: 16
Plan Prescribed Dose Per Fraction: 2 Gy
Plan Total Fractions Prescribed: 20
Plan Total Prescribed Dose: 40 Gy
Reference Point Dosage Given to Date: 32 Gy
Reference Point Session Dosage Given: 2 Gy
Session Number: 16

## 2022-08-13 ENCOUNTER — Other Ambulatory Visit: Payer: Self-pay | Admitting: Oncology

## 2022-08-13 ENCOUNTER — Other Ambulatory Visit: Payer: Self-pay

## 2022-08-13 ENCOUNTER — Ambulatory Visit
Admission: RE | Admit: 2022-08-13 | Discharge: 2022-08-13 | Disposition: A | Discharge status: Home / Self Care | Payer: Medicare HMO | Source: Ambulatory Visit | Attending: Oncology | Admitting: Oncology

## 2022-08-13 DIAGNOSIS — Z510 Encounter for antineoplastic radiation therapy: Secondary | ICD-10-CM | POA: Insufficient documentation

## 2022-08-13 DIAGNOSIS — C187 Malignant neoplasm of sigmoid colon: Secondary | ICD-10-CM | POA: Insufficient documentation

## 2022-08-13 DIAGNOSIS — C772 Secondary and unspecified malignant neoplasm of intra-abdominal lymph nodes: Secondary | ICD-10-CM | POA: Diagnosis not present

## 2022-08-13 DIAGNOSIS — C786 Secondary malignant neoplasm of retroperitoneum and peritoneum: Secondary | ICD-10-CM | POA: Insufficient documentation

## 2022-08-13 DIAGNOSIS — C189 Malignant neoplasm of colon, unspecified: Secondary | ICD-10-CM | POA: Diagnosis not present

## 2022-08-13 LAB — RAD ONC ARIA SESSION SUMMARY
Course Elapsed Days: 22
Plan Fractions Treated to Date: 17
Plan Prescribed Dose Per Fraction: 2 Gy
Plan Total Fractions Prescribed: 20
Plan Total Prescribed Dose: 40 Gy
Reference Point Dosage Given to Date: 34 Gy
Reference Point Session Dosage Given: 2 Gy
Session Number: 17

## 2022-08-14 ENCOUNTER — Other Ambulatory Visit: Payer: Self-pay | Admitting: Oncology

## 2022-08-14 ENCOUNTER — Telehealth: Payer: Self-pay | Admitting: Oncology

## 2022-08-14 MED ORDER — DIPHENOXYLATE-ATROPINE 2.5-0.025 MG PO TABS: 1.0000 | ORAL_TABLET | Freq: Four times a day (QID) | ORAL | 0 refills | Status: DC | PRN

## 2022-08-14 NOTE — Telephone Encounter (Signed)
Patient called to report stomach cramps, diarrhea, imodium did not help. No nausea, vomiting, fever. On chemotherapy with irinotecan, panitumumab, as well as palliative radiation to pelvis Possible chemo RT side effects.  Recommend lomotil PRN, Rx sent. Encourage oral hydration, if his symptom is not improved, recommend him to go to ED. He agrees with the plan.   Cc Dr.Rao to follow up next week

## 2022-08-15 ENCOUNTER — Encounter: Payer: Self-pay | Admitting: Oncology

## 2022-08-17 ENCOUNTER — Other Ambulatory Visit: Payer: Self-pay

## 2022-08-17 ENCOUNTER — Ambulatory Visit
Admission: RE | Admit: 2022-08-17 | Discharge: 2022-08-17 | Disposition: A | Discharge status: Home / Self Care | Payer: Medicare HMO | Source: Ambulatory Visit | Attending: Radiation Oncology | Admitting: Radiation Oncology

## 2022-08-17 DIAGNOSIS — C772 Secondary and unspecified malignant neoplasm of intra-abdominal lymph nodes: Secondary | ICD-10-CM | POA: Diagnosis not present

## 2022-08-17 DIAGNOSIS — C187 Malignant neoplasm of sigmoid colon: Secondary | ICD-10-CM | POA: Diagnosis not present

## 2022-08-17 DIAGNOSIS — C786 Secondary malignant neoplasm of retroperitoneum and peritoneum: Secondary | ICD-10-CM | POA: Diagnosis not present

## 2022-08-17 DIAGNOSIS — Z510 Encounter for antineoplastic radiation therapy: Secondary | ICD-10-CM | POA: Diagnosis not present

## 2022-08-17 DIAGNOSIS — C189 Malignant neoplasm of colon, unspecified: Secondary | ICD-10-CM | POA: Diagnosis not present

## 2022-08-17 LAB — RAD ONC ARIA SESSION SUMMARY
Course Elapsed Days: 26
Plan Fractions Treated to Date: 18
Plan Prescribed Dose Per Fraction: 2 Gy
Plan Total Fractions Prescribed: 20
Plan Total Prescribed Dose: 40 Gy
Reference Point Dosage Given to Date: 36 Gy
Reference Point Session Dosage Given: 2 Gy
Session Number: 18

## 2022-08-18 ENCOUNTER — Inpatient Hospital Stay: Payer: Medicare HMO | Attending: Oncology

## 2022-08-18 ENCOUNTER — Other Ambulatory Visit: Payer: Self-pay

## 2022-08-18 ENCOUNTER — Ambulatory Visit
Admission: RE | Admit: 2022-08-18 | Discharge: 2022-08-18 | Disposition: A | Discharge status: Home / Self Care | Payer: Medicare HMO | Source: Ambulatory Visit | Attending: Radiation Oncology | Admitting: Radiation Oncology

## 2022-08-18 DIAGNOSIS — Z5112 Encounter for antineoplastic immunotherapy: Secondary | ICD-10-CM | POA: Insufficient documentation

## 2022-08-18 DIAGNOSIS — Z510 Encounter for antineoplastic radiation therapy: Secondary | ICD-10-CM | POA: Diagnosis not present

## 2022-08-18 DIAGNOSIS — C786 Secondary malignant neoplasm of retroperitoneum and peritoneum: Secondary | ICD-10-CM | POA: Diagnosis not present

## 2022-08-18 DIAGNOSIS — C772 Secondary and unspecified malignant neoplasm of intra-abdominal lymph nodes: Secondary | ICD-10-CM | POA: Insufficient documentation

## 2022-08-18 DIAGNOSIS — C187 Malignant neoplasm of sigmoid colon: Secondary | ICD-10-CM | POA: Diagnosis not present

## 2022-08-18 DIAGNOSIS — Z5111 Encounter for antineoplastic chemotherapy: Secondary | ICD-10-CM | POA: Insufficient documentation

## 2022-08-18 DIAGNOSIS — C189 Malignant neoplasm of colon, unspecified: Secondary | ICD-10-CM | POA: Diagnosis not present

## 2022-08-18 LAB — CBC
HCT: 37 % — ABNORMAL LOW (ref 39.0–52.0)
Hemoglobin: 13 g/dL (ref 13.0–17.0)
MCH: 33 pg (ref 26.0–34.0)
MCHC: 35.1 g/dL (ref 30.0–36.0)
MCV: 93.9 fL (ref 80.0–100.0)
Platelets: 95 10*3/uL — ABNORMAL LOW (ref 150–400)
RBC: 3.94 MIL/uL — ABNORMAL LOW (ref 4.22–5.81)
RDW: 15 % (ref 11.5–15.5)
WBC: 5.4 10*3/uL (ref 4.0–10.5)
nRBC: 0 % (ref 0.0–0.2)

## 2022-08-18 LAB — RAD ONC ARIA SESSION SUMMARY
Course Elapsed Days: 27
Plan Fractions Treated to Date: 19
Plan Prescribed Dose Per Fraction: 2 Gy
Plan Total Fractions Prescribed: 20
Plan Total Prescribed Dose: 40 Gy
Reference Point Dosage Given to Date: 38 Gy
Reference Point Session Dosage Given: 2 Gy
Session Number: 19

## 2022-08-19 ENCOUNTER — Other Ambulatory Visit: Payer: Self-pay

## 2022-08-19 ENCOUNTER — Other Ambulatory Visit (HOSPITAL_COMMUNITY): Payer: Self-pay

## 2022-08-19 ENCOUNTER — Other Ambulatory Visit: Payer: Self-pay | Admitting: Oncology

## 2022-08-19 ENCOUNTER — Ambulatory Visit
Admission: RE | Admit: 2022-08-19 | Discharge: 2022-08-19 | Disposition: A | Discharge status: Home / Self Care | Payer: Medicare HMO | Source: Ambulatory Visit | Attending: Radiation Oncology | Admitting: Radiation Oncology

## 2022-08-19 DIAGNOSIS — Z510 Encounter for antineoplastic radiation therapy: Secondary | ICD-10-CM | POA: Diagnosis not present

## 2022-08-19 DIAGNOSIS — C189 Malignant neoplasm of colon, unspecified: Secondary | ICD-10-CM

## 2022-08-19 DIAGNOSIS — C786 Secondary malignant neoplasm of retroperitoneum and peritoneum: Secondary | ICD-10-CM | POA: Diagnosis not present

## 2022-08-19 DIAGNOSIS — C772 Secondary and unspecified malignant neoplasm of intra-abdominal lymph nodes: Secondary | ICD-10-CM | POA: Diagnosis not present

## 2022-08-19 DIAGNOSIS — C187 Malignant neoplasm of sigmoid colon: Secondary | ICD-10-CM | POA: Diagnosis not present

## 2022-08-19 LAB — RAD ONC ARIA SESSION SUMMARY
Course Elapsed Days: 28
Plan Fractions Treated to Date: 20
Plan Prescribed Dose Per Fraction: 2 Gy
Plan Total Fractions Prescribed: 20
Plan Total Prescribed Dose: 40 Gy
Reference Point Dosage Given to Date: 40 Gy
Reference Point Session Dosage Given: 2 Gy
Session Number: 20

## 2022-08-19 NOTE — Telephone Encounter (Signed)
Error

## 2022-08-20 ENCOUNTER — Telehealth: Payer: Self-pay | Admitting: *Deleted

## 2022-08-20 ENCOUNTER — Other Ambulatory Visit (HOSPITAL_COMMUNITY): Payer: Self-pay

## 2022-08-20 DIAGNOSIS — C772 Secondary and unspecified malignant neoplasm of intra-abdominal lymph nodes: Secondary | ICD-10-CM

## 2022-08-20 NOTE — Telephone Encounter (Signed)
Called the pt and got his voicemail. I left message that we were going to refill the xeloda and wanted to make sure if diarrhea got better. I know that Dr. Tasia Catchings on call and sent in lomotil and see what the status of how he feels

## 2022-08-23 ENCOUNTER — Other Ambulatory Visit (HOSPITAL_COMMUNITY): Payer: Self-pay

## 2022-08-24 ENCOUNTER — Other Ambulatory Visit (HOSPITAL_COMMUNITY): Payer: Self-pay

## 2022-08-25 ENCOUNTER — Other Ambulatory Visit (HOSPITAL_COMMUNITY): Payer: Self-pay

## 2022-08-27 ENCOUNTER — Other Ambulatory Visit: Payer: Self-pay | Admitting: *Deleted

## 2022-08-27 ENCOUNTER — Other Ambulatory Visit (HOSPITAL_COMMUNITY): Payer: Self-pay

## 2022-08-27 DIAGNOSIS — C772 Secondary and unspecified malignant neoplasm of intra-abdominal lymph nodes: Secondary | ICD-10-CM

## 2022-08-27 MED ORDER — CAPECITABINE 500 MG PO TABS
500.0000 mg | ORAL_TABLET | Freq: Two times a day (BID) | ORAL | 0 refills | Status: DC
  Filled 2022-08-27: qty 28, 21d supply, fill #0

## 2022-08-27 MED ORDER — CAPECITABINE 500 MG PO TABS
500.0000 mg | ORAL_TABLET | Freq: Two times a day (BID) | ORAL | 0 refills | Status: DC
  Filled 2022-08-27: qty 28, 14d supply, fill #0

## 2022-08-27 MED FILL — Dexamethasone Sodium Phosphate Inj 100 MG/10ML: INTRAMUSCULAR | Qty: 1 | Status: AC

## 2022-08-27 NOTE — Addendum Note (Signed)
Addended by: Darl Pikes on: 08/27/2022 09:59 AM   Modules accepted: Orders

## 2022-08-27 NOTE — Telephone Encounter (Signed)
Spoke with patient on 08/27/22. His diarrhea has improved, he reports having watery stools every 2-3 days. When she water stools occur, he take 1-2 anti-diarrhea doses and it resolves. Per Dr. [name], okay to refill capecitabine and for patient to resume on Monday  Patient did report that that continues to have gas pain and that he tried Gas-X and it did not help.

## 2022-08-28 ENCOUNTER — Other Ambulatory Visit (HOSPITAL_COMMUNITY): Payer: Self-pay

## 2022-08-30 ENCOUNTER — Inpatient Hospital Stay (HOSPITAL_BASED_OUTPATIENT_CLINIC_OR_DEPARTMENT_OTHER): Payer: Medicare HMO | Admitting: Oncology

## 2022-08-30 ENCOUNTER — Encounter: Payer: Self-pay | Admitting: Oncology

## 2022-08-30 ENCOUNTER — Inpatient Hospital Stay: Payer: Medicare HMO

## 2022-08-30 VITALS — BP 112/55 | HR 84 | Temp 96.7°F | Resp 18 | Wt 153.8 lb

## 2022-08-30 DIAGNOSIS — Z5112 Encounter for antineoplastic immunotherapy: Secondary | ICD-10-CM | POA: Diagnosis not present

## 2022-08-30 DIAGNOSIS — C189 Malignant neoplasm of colon, unspecified: Secondary | ICD-10-CM | POA: Diagnosis not present

## 2022-08-30 DIAGNOSIS — Z5111 Encounter for antineoplastic chemotherapy: Secondary | ICD-10-CM | POA: Diagnosis not present

## 2022-08-30 DIAGNOSIS — C772 Secondary and unspecified malignant neoplasm of intra-abdominal lymph nodes: Secondary | ICD-10-CM | POA: Insufficient documentation

## 2022-08-30 DIAGNOSIS — Z79899 Other long term (current) drug therapy: Secondary | ICD-10-CM | POA: Diagnosis not present

## 2022-08-30 LAB — COMPREHENSIVE METABOLIC PANEL
ALT: 21 U/L (ref 0–44)
AST: 32 U/L (ref 15–41)
Albumin: 3.7 g/dL (ref 3.5–5.0)
Alkaline Phosphatase: 107 U/L (ref 38–126)
Anion gap: 7 (ref 5–15)
BUN: 23 mg/dL (ref 8–23)
CO2: 23 mmol/L (ref 22–32)
Calcium: 8.7 mg/dL — ABNORMAL LOW (ref 8.9–10.3)
Chloride: 107 mmol/L (ref 98–111)
Creatinine, Ser: 0.92 mg/dL (ref 0.61–1.24)
GFR, Estimated: >60 mL/min (ref >60)
Glucose, Bld: 152 mg/dL — ABNORMAL HIGH (ref 70–99)
Potassium: 3.2 mmol/L — ABNORMAL LOW (ref 3.5–5.1)
Sodium: 137 mmol/L (ref 135–145)
Total Bilirubin: 0.6 mg/dL (ref 0.3–1.2)
Total Protein: 6.9 g/dL (ref 6.5–8.1)

## 2022-08-30 LAB — CBC WITH DIFFERENTIAL/PLATELET
Abs Immature Granulocytes: 0.04 10*3/uL (ref 0.00–0.07)
Basophils Absolute: 0.1 10*3/uL (ref 0.0–0.1)
Basophils Relative: 1 %
Eosinophils Absolute: 0.3 10*3/uL (ref 0.0–0.5)
Eosinophils Relative: 7 %
HCT: 37.6 % — ABNORMAL LOW (ref 39.0–52.0)
Hemoglobin: 13 g/dL (ref 13.0–17.0)
Immature Granulocytes: 1 %
Lymphocytes Relative: 18 %
Lymphs Abs: 0.9 10*3/uL (ref 0.7–4.0)
MCH: 32.7 pg (ref 26.0–34.0)
MCHC: 34.6 g/dL (ref 30.0–36.0)
MCV: 94.7 fL (ref 80.0–100.0)
Monocytes Absolute: 0.5 10*3/uL (ref 0.1–1.0)
Monocytes Relative: 9 %
Neutro Abs: 3.3 10*3/uL (ref 1.7–7.7)
Neutrophils Relative %: 64 %
Platelets: 115 10*3/uL — ABNORMAL LOW (ref 150–400)
RBC: 3.97 MIL/uL — ABNORMAL LOW (ref 4.22–5.81)
RDW: 16.5 % — ABNORMAL HIGH (ref 11.5–15.5)
WBC: 5.1 10*3/uL (ref 4.0–10.5)
nRBC: 0 % (ref 0.0–0.2)

## 2022-08-30 LAB — MAGNESIUM: Magnesium: 1.9 mg/dL (ref 1.7–2.4)

## 2022-08-30 MED ORDER — SODIUM CHLORIDE 0.9 % IV SOLN
Freq: Once | INTRAVENOUS | Status: AC
  Filled 2022-08-30: qty 250

## 2022-08-30 MED ORDER — ATROPINE SULFATE 1 MG/ML IV SOLN
0.5000 mg | Freq: Once | INTRAVENOUS | Status: AC | PRN
  Administered 2022-08-30: 0.5 mg via INTRAVENOUS

## 2022-08-30 MED ORDER — HEPARIN SOD (PORK) LOCK FLUSH 100 UNIT/ML IV SOLN
INTRAVENOUS | Status: AC
  Administered 2022-08-30: 500 [IU]
  Filled 2022-08-30: qty 5

## 2022-08-30 MED ORDER — SODIUM CHLORIDE 0.9 % IV SOLN
125.0000 mg/m2 | Freq: Once | INTRAVENOUS | Status: AC
  Administered 2022-08-30: 220 mg via INTRAVENOUS
  Filled 2022-08-30: qty 11

## 2022-08-30 MED ORDER — SODIUM CHLORIDE 0.9 % IV SOLN
125.0000 mg/m2 | Freq: Once | INTRAVENOUS | Status: DC
  Filled 2022-08-30: qty 11

## 2022-08-30 MED ORDER — SODIUM CHLORIDE 0.9 % IV SOLN
10.0000 mg | Freq: Once | INTRAVENOUS | Status: AC
  Administered 2022-08-30: 10 mg via INTRAVENOUS
  Filled 2022-08-30: qty 10

## 2022-08-30 MED ORDER — ATROPINE SULFATE 1 MG/ML IV SOLN
INTRAVENOUS | Status: AC
  Filled 2022-08-30: qty 1

## 2022-08-30 MED ORDER — PALONOSETRON HCL INJECTION 0.25 MG/5ML
0.2500 mg | Freq: Once | INTRAVENOUS | Status: AC
  Administered 2022-08-30: 0.25 mg via INTRAVENOUS
  Filled 2022-08-30: qty 5

## 2022-08-30 MED ORDER — SODIUM CHLORIDE 0.9 % IV SOLN
6.0000 mg/kg | Freq: Once | INTRAVENOUS | Status: AC
  Administered 2022-08-30: 400 mg via INTRAVENOUS
  Filled 2022-08-30: qty 20

## 2022-08-30 MED ORDER — HEPARIN SOD (PORK) LOCK FLUSH 100 UNIT/ML IV SOLN
500.0000 [IU] | Freq: Once | INTRAVENOUS | Status: AC | PRN
  Filled 2022-08-30: qty 5

## 2022-08-30 NOTE — Patient Instructions (Signed)
[facility] CANCER CTR AT [place]  Discharge Instructions: Thank you for choosing [facility] to provide your oncology and hematology care.  If you have a lab appointment with the [place], please go directly to the [place] and check in at the registration area.  Wear comfortable clothing and clothing appropriate for easy access to any Portacath or PICC line.   We strive to give you quality time with your provider. You may need to reschedule your appointment if you arrive late (15 or more minutes).  Arriving late affects you and other patients whose appointments are after yours.  Also, if you miss three or more appointments without notifying the office, you may be dismissed from the clinic at the provider's discretion.      For prescription refill requests, have your pharmacy contact our office and allow 72 hours for refills to be completed.    Today you received the following chemotherapy and/or immunotherapy agents Irinotecan and Vectibix      To help prevent nausea and vomiting after your treatment, we encourage you to take your nausea medication as directed.  BELOW ARE SYMPTOMS THAT SHOULD BE REPORTED IMMEDIATELY: *FEVER GREATER THAN 100.4 F (38 C) OR HIGHER *CHILLS OR SWEATING *NAUSEA AND VOMITING THAT IS NOT CONTROLLED WITH YOUR NAUSEA MEDICATION *UNUSUAL SHORTNESS OF BREATH *UNUSUAL BRUISING OR BLEEDING *URINARY PROBLEMS (pain or burning when urinating, or frequent urination) *BOWEL PROBLEMS (unusual diarrhea, constipation, pain near the anus) TENDERNESS IN MOUTH AND THROAT WITH OR WITHOUT PRESENCE OF ULCERS (sore throat, sores in mouth, or a toothache) UNUSUAL RASH, SWELLING OR PAIN  UNUSUAL VAGINAL DISCHARGE OR ITCHING   Items with * indicate a potential emergency and should be followed up as soon as possible or go to the Emergency Department if any problems should occur.  Please show the CHEMOTHERAPY ALERT CARD or IMMUNOTHERAPY ALERT CARD at  check-in to the Emergency Department and triage nurse.  Should you have questions after your visit or need to cancel or reschedule your appointment, please contact [facility] CANCER [place] AT [address]  [phone] and follow the prompts.  Office hours are 8:00 a.m. to 4:30 p.m. Monday - Friday. Please note that voicemails left after 4:00 p.m. may not be returned until the following business day.  We are closed weekends and major holidays. You have access to a nurse at all times for urgent questions. Please call the main number to the clinic [phone] and follow the prompts.  For any non-urgent questions, you may also contact your provider using MyChart. We now offer e-Visits for anyone 32 and older to request care online for non-urgent symptoms. For details visit [email or web address].   Also download the MyChart app! Go to the app store, search "MyChart", open the app, select [facility], and log in with your MyChart username and password.  Masks are optional in the cancer centers. If you would like for your care team to wear a mask while they are taking care of you, please let them know. For doctor visits, patients may have with them one support person who is at least 73 years old. At this time, visitors are not allowed in the infusion area.

## 2022-08-30 NOTE — Progress Notes (Signed)
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 He currently smokes 0.5ppd. he presented to the ER with symptoms of left sided chest pain and left arm pain. Troponin was negative ekg was unremarkable. Ct chest showed no PE. He was incidentally noted to have mediastinal and retrocrural adenopathy and a 2.1X2.3X4.4 cm retroaortic soft tissue lesion in the posterior left chest. 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  Low-grade activity in the left lateral fifth and sixth ribs associated with nondisplaced fractures likely reflecting healing response problem malignancy.   Patient underwent CT-guided biopsy of the retroperitoneal lymph node pathology showed metastatic adenocarcinoma compatible with colorectal origin.  CK7 negative.  CK20 positive.  CDX 2+.  TTF-1 negative.  PSA negative.  This pattern of immunoreactivity supports the above diagnosis. Patient underwent colonoscopy which showed sigmoid mass that was consistent with adenocarcinoma.  RAS panel testing showed that he was wild-type for both K-ras and BRAF   FOLFOX and bevacizumab chemotherapy started in July 2020.  Subsequently oxaliplatin has been on hold given neuropathy.   Recent scans have shown slow increase in the size of intra-abdominal adenopathy but patient continues to have low-volume disease.plan is to switch him to second line Xeloda irinotecan panitumumab  regimen  Interval history-patient is tolerating treatments well so far.  He did have an episode of nausea vomiting and diarrhea during radiation treatment about 2 weeks ago but those symptoms have resolved.  Appetite is improving and bowel movements are regular.  ECOG PS- 1 Pain scale- 0   Review of systems- Review of Systems  Constitutional:  Positive for malaise/fatigue. Negative for chills, fever and weight loss.  HENT:  Negative for congestion, ear discharge and nosebleeds.   Eyes:  Negative for blurred vision.  Respiratory:  Negative for cough, hemoptysis, sputum production, shortness of breath and wheezing.   Cardiovascular:  Negative for chest pain, palpitations, orthopnea and claudication.  Gastrointestinal:  Negative for abdominal pain, blood in stool, constipation, diarrhea, heartburn, melena, nausea and vomiting.  Genitourinary:  Negative for dysuria, flank pain, frequency, hematuria and urgency.  Musculoskeletal:  Negative for back pain, joint pain and myalgias.  Skin:  Negative for rash.  Neurological:  Negative for dizziness, tingling, focal weakness, seizures, weakness and headaches.  Endo/Heme/Allergies:  Does not bruise/bleed easily.  Psychiatric/Behavioral:  Negative for depression and suicidal ideas. The patient does not have insomnia.       No Known Allergies   Past Medical History:  Diagnosis Date   Colon cancer ([place])    COPD (chronic obstructive pulmonary disease) ([place])    Hyperlipidemia      Past Surgical History:  Procedure Laterality Date   COLONOSCOPY WITH PROPOFOL N/A 06/26/2019   Procedure: COLONOSCOPY WITH PROPOFOL;  Surgeon: [name], MD;  Location: [facility] ENDOSCOPY;  Service: Gastroenterology;  Laterality: N/A;  FLEXIBLE SIGMOIDOSCOPY N/A 05/07/2021    Procedure: FLEXIBLE SIGMOIDOSCOPY;  Surgeon: [name], MD;  Location: [facility] ENDOSCOPY;  Service: Gastroenterology;  Laterality: N/A;   PORTA CATH INSERTION N/A 06/25/2019   Procedure: PORTA CATH INSERTION;  Surgeon: Algernon Huxley, MD;  Location: [place] CV LAB;  Service: Cardiovascular;  Laterality: N/A;   PORTA CATH INSERTION Left 08/11/2020   Procedure: PORTA CATH INSERTION;  Surgeon: Algernon Huxley, MD;  Location: [place] CV LAB;  Service: Cardiovascular;  Laterality: Left;   PORTA CATH REMOVAL Right 08/11/2020   Procedure: PORTA CATH REMOVAL;  Surgeon: Algernon Huxley, MD;  Location: [place] CV LAB;  Service: Cardiovascular;  Laterality: Right;    Social History   Socioeconomic History   Marital status: Single    Spouse name: Not on file   Number of children: Not on file   Years of education: Not on file   Highest education level: Not on file  Occupational History   Not on file  Tobacco Use   Smoking status: Every Day    Packs/day: 0.50    Years: 40.00    Total pack years: 20.00    Types: Cigarettes   Smokeless tobacco: Never   Tobacco comments:    smoking 40 years  Vaping Use   Vaping Use: Never used  Substance and Sexual Activity   Alcohol use: No   Drug use: No   Sexual activity: Not Currently  Other Topics Concern   Not on file  Social History Narrative   Not on file   Social Determinants of Health   Financial Resource Strain: Low Risk  (06/12/2019)   Overall Financial Resource Strain (CARDIA)    Difficulty of Paying Living Expenses: Not hard at all  Food Insecurity: No Food Insecurity (06/12/2019)   Hunger Vital Sign    Worried About Running Out of Food in the Last Year: Never true    Ran Out of Food in the Last Year: Never true  Transportation Needs: No Transportation Needs (06/12/2019)   PRAPARE - Hydrologist (Medical): No    Lack of Transportation (Non-Medical): No  Physical Activity: Not on file  Stress: No Stress  Concern Present (06/12/2019)   [place]    Feeling of Stress : Not at all  Social Connections: Unknown (06/12/2019)   Social Connection and Isolation Panel [NHANES]    Frequency of Communication with Friends and Family: More than three times a week    Frequency of Social Gatherings with Friends and Family: Not on file    Attends Religious Services: Not on file    Active Member of [place] or Organizations: Not on file    Attends Archivist Meetings: Not on file    Marital Status: Not on file  Intimate Partner Violence: Not At Risk (06/12/2019)   Humiliation, Afraid, Rape, and Kick questionnaire    Fear of Current or Ex-Partner: No    Emotionally Abused: No    Physically Abused: No    Sexually Abused: No    Family History  Problem Relation Age of Onset   Brain cancer Father      Current Outpatient Medications:    allopurinol (ZYLOPRIM) 100 MG tablet, Take 100 mg by mouth daily., Disp: , Rfl:    aspirin EC 81 MG tablet, Take 81 mg by mouth daily., Disp: , Rfl:    azelastine (ASTELIN) 0.1 % nasal spray, Place 1 spray into  both nostrils daily as needed., Disp: , Rfl:    butalbital-acetaminophen-caffeine (FIORICET) 50-325-40 MG tablet, Take 1 tablet by mouth every 4 (four) hours as needed., Disp: , Rfl:    capecitabine (XELODA) 500 MG tablet, Take 1 tablet (500 mg total) by mouth 2 (two) times daily after a meal. Take for 14 days, then hold for 7 days. Repeat every 21 days., Disp: 28 tablet, Rfl: 0   cetirizine (ZYRTEC) 10 MG tablet, Take 1 tablet (10 mg total) by mouth daily., Disp: 30 tablet, Rfl: 0   chlorthalidone (HYGROTON) 25 MG tablet, Take 1 tablet by mouth daily., Disp: , Rfl:    DEXILANT 60 MG capsule, Take 60 mg by mouth daily., Disp: , Rfl:    diphenoxylate-atropine (LOMOTIL) 2.5-0.025 MG tablet, Take 1 tablet by mouth 4 (four) times daily as needed for diarrhea or loose stools., Disp: 30 tablet, Rfl: 0    DULoxetine (CYMBALTA) 30 MG capsule, Take 1 capsule (30 mg total) by mouth 2 (two) times daily., Disp: 60 capsule, Rfl: 1   fluticasone (FLONASE) 50 MCG/ACT nasal spray, Place 2 sprays into both nostrils daily as needed., Disp: , Rfl:    lidocaine-prilocaine (EMLA) cream, Apply 1 application  topically as needed (pt will put 1 inch of cream over port 1 hourfor each treatment, cover plastic over the cream protect clothes)., Disp: 30 g, Rfl: 3   lisinopril (ZESTRIL) 5 MG tablet, Take 5 mg by mouth daily., Disp: , Rfl:    montelukast (SINGULAIR) 10 MG tablet, Take 10 mg by mouth at bedtime., Disp: , Rfl:    NEXLETOL 180 MG TABS, Take 1 tablet by mouth daily., Disp: , Rfl:    pregabalin (LYRICA) 75 MG capsule, TAKE 1 CAPSULE(75 MG) BY MOUTH TWICE DAILY, Disp: 60 capsule, Rfl: 2   simvastatin (ZOCOR) 20 MG tablet, Take 20 mg by mouth daily. , Disp: , Rfl:    SYMBICORT 80-4.5 MCG/ACT inhaler, Inhale 2 puffs into the lungs daily., Disp: , Rfl:    tamsulosin (FLOMAX) 0.4 MG CAPS capsule, Take 0.4 mg by mouth. , Disp: , Rfl:    cyclobenzaprine (FLEXERIL) 10 MG tablet, TAKE 1 TABLET(10 MG) BY MOUTH THREE TIMES DAILY AS NEEDED FOR MUSCLE SPASMS (Patient not taking: Reported on 08/30/2022), Disp: 42 tablet, Rfl: 0   magic mouthwash (multi-ingredient) oral suspension, Swish and spit 5-10 mls by mouth 4 times a day as needed (Patient not taking: Reported on 04/05/2022), Disp: 480 mL, Rfl: 3   oxyCODONE (OXY IR/ROXICODONE) 5 MG immediate release tablet, Take 1 tablet (5 mg total) by mouth every 6 (six) hours as needed for severe pain. (Patient not taking: Reported on 04/05/2022), Disp: 30 tablet, Rfl: 0 No current facility-administered medications for this visit.  Facility-Administered Medications Ordered in Other Visits:    atropine 1 MG/ML injection, , , ,    heparin lock flush 100 UNIT/ML injection, , , ,   Physical exam:  Vitals:   08/30/22 0829  BP: (!) 112/55  Pulse: 84  Resp: 18  Temp: (!) 96.7 F  (35.9 C)  SpO2: 100%  Weight: 153 lb 12.8 oz (69.8 kg)   Physical Exam Constitutional:      General: He is not in acute distress. Cardiovascular:     Rate and Rhythm: Normal rate and regular rhythm.     Heart sounds: Normal heart sounds.  Pulmonary:     Effort: Pulmonary effort is normal.     Breath sounds: Normal breath sounds.  Abdominal:  General: Bowel sounds are normal.     Palpations: Abdomen is soft.  Skin:    General: Skin is warm and dry.  Neurological:     Mental Status: He is alert and oriented to person, place, and time.         Latest Ref Rng & Units 08/30/2022    8:15 AM  CMP  Glucose 70 - 99 mg/dL 152   BUN 8 - 23 mg/dL 23   Creatinine 0.61 - 1.24 mg/dL 0.92   Sodium 135 - 145 mmol/L 137   Potassium 3.5 - 5.1 mmol/L 3.2   Chloride 98 - 111 mmol/L 107   CO2 22 - 32 mmol/L 23   Calcium 8.9 - 10.3 mg/dL 8.7   Total Protein 6.5 - 8.1 g/dL 6.9   Total Bilirubin 0.3 - 1.2 mg/dL 0.6   Alkaline Phos 38 - 126 U/L 107   AST 15 - 41 U/L 32   ALT 0 - 44 U/L 21       Latest Ref Rng & Units 08/30/2022    8:15 AM  CBC  WBC 4.0 - 10.5 K/uL 5.1   Hemoglobin 13.0 - 17.0 g/dL 13.0   Hematocrit 39.0 - 52.0 % 37.6   Platelets 150 - 400 K/uL 115      Assessment and plan- Patient is a 71 y.o. male with metastatic colon cancer and intra-abdominal metastases stage IV.  He is here for on treatment assessment prior to cycle 16 of panitumumab irinotecan chemotherapy  Counts okay to proceed with cycle 16 of panitumumab irinotecan chemotherapy today.  He will also start his Xeloda again today twice a day 2 weeks on and 1 week off.  I will see him back in 3 weeks for cycle 17 but I will obtain a CT chest abdomen pelvis with contrast prior. Prior scans in July 2023 showed continued increase in the size of his external and internal iliac lymph nodes.  He did receive radiation treatment given that that was his only area of disease progression but otherwise had low-volume disease.   Presently his CEA continues to increase which is concerning.  If scans show progressive disease I will plan to switch him to third line treatment with Lonsurf plus bevacizumab.  I will also refer him to [facility] for second opinion to see if he would be eligible for any clinical trial at that time     Visit Diagnosis 1. 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 ([place])   2. Encounter for antineoplastic chemotherapy   3. High risk medication use      Dr. Randa Evens, MD, MPH [facility] at [facility] [phone] 08/30/2022 12:32 PM

## 2022-08-31 LAB — CEA: CEA: 5.8 ng/mL — ABNORMAL HIGH (ref 0.0–4.7)

## 2022-09-10 ENCOUNTER — Other Ambulatory Visit (HOSPITAL_COMMUNITY): Payer: Self-pay

## 2022-09-13 ENCOUNTER — Other Ambulatory Visit: Payer: Self-pay | Admitting: Oncology

## 2022-09-13 ENCOUNTER — Other Ambulatory Visit (HOSPITAL_COMMUNITY): Payer: Self-pay

## 2022-09-13 DIAGNOSIS — C189 Malignant neoplasm of colon, unspecified: Secondary | ICD-10-CM

## 2022-09-13 MED ORDER — CAPECITABINE 500 MG PO TABS
500.0000 mg | ORAL_TABLET | Freq: Two times a day (BID) | ORAL | 0 refills | Status: DC
  Filled 2022-09-13: qty 28, 21d supply, fill #0

## 2022-09-13 NOTE — Telephone Encounter (Signed)
CBC with Differential Order: [id number] Status: Final result    Visible to patient: Yes (seen)    Next appt: 09/16/2022 at 10:00 AM in Radiation Oncology Noreene Filbert, MD)    Dx: Encounter for antineoplastic chemothe...    0 Result Notes           Component Ref Range & Units 2 wk ago (08/30/22) 3 wk ago (08/18/22) 1 mo ago (08/11/22) 1 mo ago (08/09/22) 1 mo ago (08/04/22) 1 mo ago (07/28/22) 2 mo ago (06/29/22)  WBC 4.0 - 10.5 K/uL 5.1  5.4  5.6  6.3  5.8  5.3  9.0   RBC 4.22 - 5.81 MIL/uL 3.97 Low   3.94 Low   4.09 Low   3.87 Low   4.04 Low   3.89 Low   4.12 Low    Hemoglobin 13.0 - 17.0 g/dL 13.0  13.0  13.5  12.8 Low   13.3  12.6 Low   13.7   HCT 39.0 - 52.0 % 37.6 Low   37.0 Low   39.2  36.3 Low   39.3  36.7 Low   39.6   MCV 80.0 - 100.0 fL 94.7  93.9  95.8  93.8  97.3  94.3  96.1   MCH 26.0 - 34.0 pg 32.7  33.0  33.0  33.1  32.9  32.4  33.3   MCHC 30.0 - 36.0 g/dL 34.6  35.1  34.4  35.3  33.8  34.3  34.6   RDW 11.5 - 15.5 % 16.5 High   15.0  15.0  14.9  15.2  14.4  14.6   Platelets 150 - 400 K/uL 115 Low   95 Low   127 Low  CM  125 Low   144 Low  CM  134 Low  CM  162   Comment: SPECIMEN CHECKED FOR CLOTS  nRBC 0.0 - 0.2 % 0.0  0.0 CM  0.0  0.0  0.0 CM  0.0  0.0   Neutrophils Relative % % 64   67  68   63  67   Neutro Abs 1.7 - 7.7 K/uL 3.3   3.8  4.4   3.3  6.1   Lymphocytes Relative % '18   18  15   23  21   '$ Lymphs Abs 0.7 - 4.0 K/uL 0.9   1.0  0.9   1.2  1.9   Monocytes Relative % '9   7  9   8  9   '$ Monocytes Absolute 0.1 - 1.0 K/uL 0.5   0.4  0.5   0.4  0.8   Eosinophils Relative % '7   7  6   4  2   '$ Eosinophils Absolute 0.0 - 0.5 K/uL 0.3   0.4  0.4   0.2  0.2   Basophils Relative % '1   1  1   1  1   '$ Basophils Absolute 0.0 - 0.1 K/uL 0.1   0.0  0.0   0.0  0.1   Immature Granulocytes % 1   0  1   1  0   Abs Immature Granulocytes 0.00 - 0.07 K/uL 0.04   0.02 CM  0.04 CM   0.04 CM  0.04 CM   Comment: Performed at [facility], [address] [place], [place] [address]   Resulting Agency  [place] [facility] [place] [facility] [place] [facility] [place] [facility] [place] [facility] [place] [facility] [place] [facility]  Specimen Collected: 08/30/22 08:15 Last Resulted: 08/30/22 09:04      Lab Flowsheet    Order Details    View Encounter    Lab and Collection Details    Routing    Result History    View All Conversations on this Encounter      CM=Additional comments      Result Care Coordination   Patient Communication   Add Comments   Seen Back to Top       Other Results from 08/30/2022  Magnesium Order: [id number] Status: Final result    Visible to patient: Yes (seen)    Next appt: 09/16/2022 at 10:00 AM in Radiation Oncology Noreene Filbert, MD)    Dx: Encounter for antineoplastic chemothe...    0 Result Notes           Component Ref Range & Units 2 wk ago (08/30/22) 1 mo ago (08/11/22) 1 mo ago (08/09/22) 2 mo ago (06/29/22) 3 mo ago (05/24/22) 4 mo ago (04/26/22) 5 mo ago (04/05/22)  Magnesium 1.7 - 2.4 mg/dL 1.9  2.3 CM  1.9 CM  2.1 CM  1.9 CM  2.0 CM  1.8 CM   Comment: Performed at [facility], [address] [place], [place] [address]  Resulting Agency  [place] [facility] [place] [facility] [place] [facility] [place] [facility] [place] [facility] [place] [facility] [place] [facility]         Specimen Collected: 08/30/22 08:15 Last Resulted: 08/30/22 08:43      Lab Flowsheet    Order Details    View Encounter    Lab and Collection Details    Routing    Result History    View All Conversations on this Encounter      CM=Additional comments      Result Care Coordination   Patient Communication   Add Comments   Seen Back to Top          Contains abnormal data Comprehensive metabolic panel Order: [id number] Status: Final result    Visible to patient: Yes (seen)    Next appt: 09/16/2022 at 10:00 AM in Radiation Oncology Noreene Filbert, MD)    Dx: Encounter for antineoplastic chemothe...    0 Result Notes           Component Ref Range & Units 2 wk ago (08/30/22) 1 mo ago (08/11/22) 1 mo  ago (08/09/22) 2 mo ago (06/29/22) 3 mo ago (06/14/22) 3 mo ago (06/13/22) 3 mo ago (05/24/22)  Sodium 135 - 145 mmol/L 137  137  133 Low   136  141 CM  141  136   Potassium 3.5 - 5.1 mmol/L 3.2 Low   3.8  3.4 Low   3.5  3.9  3.8  4.0   Chloride 98 - 111 mmol/L 107  106  103  106  112 High   109  104   CO2 22 - 32 mmol/L '23  24  23  24  22  23  23   '$ Glucose, Bld 70 - 99 mg/dL 152 High   98 CM  94 CM  150 High  CM  93 CM  93 CM  107 High  CM   Comment: Glucose reference range applies only to samples taken after fasting for at least 8 hours.  BUN 8 - 23 mg/dL 23  39 High   26 High   36 High   25 High   28 High   30 High  Creatinine, Ser 0.61 - 1.24 mg/dL 0.92  1.10  0.97  1.44 High   1.29 High   1.48 High   1.11   Calcium 8.9 - 10.3 mg/dL 8.7 Low   8.9  8.5 Low   8.7 Low   8.8 Low   8.8 Low   9.1   Total Protein 6.5 - 8.1 g/dL 6.9  7.6  7.0  7.2   6.8  7.5   Albumin 3.5 - 5.0 g/dL 3.7  3.9  3.6  3.9   3.6  3.7   AST 15 - 41 U/L 32  37  29  24   37  27   ALT 0 - 44 U/L '21  25  19  13   22  17   '$ Alkaline Phosphatase 38 - 126 U/L 107  122  126  140 High    124  145 High    Total Bilirubin 0.3 - 1.2 mg/dL 0.6  0.7  0.9  0.9   0.9  1.1   GFR, Estimated >60 mL/min >60  >60 CM  >60 CM  52 Low  CM  59 Low  CM  50 Low  CM  >60 CM   Comment: (NOTE)  Calculated using the CKD-EPI Creatinine Equation (2021)   Anion gap 5 - '15 7  7 '$ CM  7 CM  6 CM  7 CM  9 CM  9 CM   Comment: Performed at [facility], [address] [place], [place] [address]  Resulting Agency  [facility] [place] [facility] [place] [facility] [place] [facility] [place] [facility] [place] [facility] [place] [facility]         Specimen Collected: 08/30/22 08:15 Last Resulted: 08/30/22 08:43

## 2022-09-16 ENCOUNTER — Ambulatory Visit
Admission: RE | Admit: 2022-09-16 | Discharge: 2022-09-16 | Disposition: A | Discharge status: Home / Self Care | Payer: Medicare HMO | Source: Ambulatory Visit | Attending: Radiation Oncology | Admitting: Radiation Oncology

## 2022-09-16 ENCOUNTER — Other Ambulatory Visit (HOSPITAL_COMMUNITY): Payer: Self-pay

## 2022-09-16 ENCOUNTER — Encounter: Payer: Self-pay | Admitting: Radiation Oncology

## 2022-09-16 VITALS — BP 110/69 | HR 76 | Temp 97.7°F | Resp 20 | Ht 67.0 in | Wt 155.0 lb

## 2022-09-16 DIAGNOSIS — C189 Malignant neoplasm of colon, unspecified: Secondary | ICD-10-CM

## 2022-09-17 ENCOUNTER — Ambulatory Visit
Admission: RE | Admit: 2022-09-17 | Discharge: 2022-09-17 | Disposition: A | Discharge status: Home / Self Care | Payer: Medicare HMO | Source: Ambulatory Visit | Attending: Oncology | Admitting: Oncology

## 2022-09-17 DIAGNOSIS — C772 Secondary and unspecified malignant neoplasm of intra-abdominal lymph nodes: Secondary | ICD-10-CM | POA: Insufficient documentation

## 2022-09-17 DIAGNOSIS — I700 Atherosclerosis of aorta: Secondary | ICD-10-CM | POA: Diagnosis not present

## 2022-09-17 DIAGNOSIS — C189 Malignant neoplasm of colon, unspecified: Secondary | ICD-10-CM | POA: Diagnosis not present

## 2022-09-17 DIAGNOSIS — J432 Centrilobular emphysema: Secondary | ICD-10-CM | POA: Diagnosis not present

## 2022-09-17 DIAGNOSIS — K5790 Diverticulosis of intestine, part unspecified, without perforation or abscess without bleeding: Secondary | ICD-10-CM | POA: Diagnosis not present

## 2022-09-17 DIAGNOSIS — K429 Umbilical hernia without obstruction or gangrene: Secondary | ICD-10-CM | POA: Diagnosis not present

## 2022-09-17 DIAGNOSIS — K820 Obstruction of gallbladder: Secondary | ICD-10-CM | POA: Diagnosis not present

## 2022-09-17 DIAGNOSIS — J929 Pleural plaque without asbestos: Secondary | ICD-10-CM | POA: Diagnosis not present

## 2022-09-17 DIAGNOSIS — Z5111 Encounter for antineoplastic chemotherapy: Secondary | ICD-10-CM | POA: Diagnosis not present

## 2022-09-17 MED ORDER — IOHEXOL 300 MG/ML  SOLN
100.0000 mL | Freq: Once | INTRAMUSCULAR | Status: AC | PRN
  Administered 2022-09-17: 100 mL via INTRAVENOUS

## 2022-09-17 MED FILL — Dexamethasone Sodium Phosphate Inj 100 MG/10ML: INTRAMUSCULAR | Qty: 1 | Status: AC

## 2022-09-20 ENCOUNTER — Encounter: Payer: Self-pay | Admitting: Oncology

## 2022-09-20 ENCOUNTER — Inpatient Hospital Stay: Payer: Medicare HMO

## 2022-09-20 ENCOUNTER — Inpatient Hospital Stay: Payer: Medicare HMO | Attending: Oncology | Admitting: Oncology

## 2022-09-20 VITALS — BP 130/63 | HR 70 | Temp 96.8°F | Wt 155.0 lb

## 2022-09-20 DIAGNOSIS — C786 Secondary malignant neoplasm of retroperitoneum and peritoneum: Secondary | ICD-10-CM | POA: Diagnosis not present

## 2022-09-20 DIAGNOSIS — C189 Malignant neoplasm of colon, unspecified: Secondary | ICD-10-CM

## 2022-09-20 DIAGNOSIS — Z5111 Encounter for antineoplastic chemotherapy: Secondary | ICD-10-CM | POA: Insufficient documentation

## 2022-09-20 DIAGNOSIS — C772 Secondary and unspecified malignant neoplasm of intra-abdominal lymph nodes: Secondary | ICD-10-CM

## 2022-09-20 DIAGNOSIS — G629 Polyneuropathy, unspecified: Secondary | ICD-10-CM | POA: Insufficient documentation

## 2022-09-20 DIAGNOSIS — Z5112 Encounter for antineoplastic immunotherapy: Secondary | ICD-10-CM | POA: Insufficient documentation

## 2022-09-20 DIAGNOSIS — Z79899 Other long term (current) drug therapy: Secondary | ICD-10-CM | POA: Insufficient documentation

## 2022-09-20 LAB — COMPREHENSIVE METABOLIC PANEL
ALT: 23 U/L (ref 0–44)
AST: 29 U/L (ref 15–41)
Albumin: 3.9 g/dL (ref 3.5–5.0)
Alkaline Phosphatase: 120 U/L (ref 38–126)
Anion gap: 6 (ref 5–15)
BUN: 25 mg/dL — ABNORMAL HIGH (ref 8–23)
CO2: 22 mmol/L (ref 22–32)
Calcium: 9 mg/dL (ref 8.9–10.3)
Chloride: 105 mmol/L (ref 98–111)
Creatinine, Ser: 0.95 mg/dL (ref 0.61–1.24)
GFR, Estimated: >60 mL/min (ref >60)
Glucose, Bld: 106 mg/dL — ABNORMAL HIGH (ref 70–99)
Potassium: 3.8 mmol/L (ref 3.5–5.1)
Sodium: 133 mmol/L — ABNORMAL LOW (ref 135–145)
Total Bilirubin: 0.9 mg/dL (ref 0.3–1.2)
Total Protein: 7.3 g/dL (ref 6.5–8.1)

## 2022-09-20 LAB — CBC WITH DIFFERENTIAL/PLATELET
Abs Immature Granulocytes: 0.04 10*3/uL (ref 0.00–0.07)
Basophils Absolute: 0 10*3/uL (ref 0.0–0.1)
Basophils Relative: 1 %
Eosinophils Absolute: 0.1 10*3/uL (ref 0.0–0.5)
Eosinophils Relative: 2 %
HCT: 37.6 % — ABNORMAL LOW (ref 39.0–52.0)
Hemoglobin: 13.4 g/dL (ref 13.0–17.0)
Immature Granulocytes: 1 %
Lymphocytes Relative: 18 %
Lymphs Abs: 0.8 10*3/uL (ref 0.7–4.0)
MCH: 33.7 pg (ref 26.0–34.0)
MCHC: 35.6 g/dL (ref 30.0–36.0)
MCV: 94.5 fL (ref 80.0–100.0)
Monocytes Absolute: 0.5 10*3/uL (ref 0.1–1.0)
Monocytes Relative: 11 %
Neutro Abs: 3.1 10*3/uL (ref 1.7–7.7)
Neutrophils Relative %: 67 %
Platelets: 113 10*3/uL — ABNORMAL LOW (ref 150–400)
RBC: 3.98 MIL/uL — ABNORMAL LOW (ref 4.22–5.81)
RDW: 16.3 % — ABNORMAL HIGH (ref 11.5–15.5)
WBC: 4.6 10*3/uL (ref 4.0–10.5)
nRBC: 0 % (ref 0.0–0.2)

## 2022-09-20 LAB — MAGNESIUM: Magnesium: 1.8 mg/dL (ref 1.7–2.4)

## 2022-09-20 MED ORDER — HEPARIN SOD (PORK) LOCK FLUSH 100 UNIT/ML IV SOLN
500.0000 [IU] | Freq: Once | INTRAVENOUS | Status: DC
  Filled 2022-09-20: qty 5

## 2022-09-20 MED ORDER — SODIUM CHLORIDE 0.9% FLUSH
10.0000 mL | INTRAVENOUS | Status: DC | PRN
  Administered 2022-09-20: 10 mL via INTRAVENOUS
  Filled 2022-09-20: qty 10

## 2022-09-20 MED ORDER — PALONOSETRON HCL INJECTION 0.25 MG/5ML
0.2500 mg | Freq: Once | INTRAVENOUS | Status: AC
  Administered 2022-09-20: 0.25 mg via INTRAVENOUS
  Filled 2022-09-20: qty 5

## 2022-09-20 MED ORDER — SODIUM CHLORIDE 0.9 % IV SOLN
6.0000 mg/kg | Freq: Once | INTRAVENOUS | Status: AC
  Administered 2022-09-20: 400 mg via INTRAVENOUS
  Filled 2022-09-20: qty 20

## 2022-09-20 MED ORDER — SODIUM CHLORIDE 0.9 % IV SOLN
10.0000 mg | Freq: Once | INTRAVENOUS | Status: AC
  Administered 2022-09-20: 10 mg via INTRAVENOUS
  Filled 2022-09-20: qty 10

## 2022-09-20 MED ORDER — HEPARIN SOD (PORK) LOCK FLUSH 100 UNIT/ML IV SOLN
500.0000 [IU] | Freq: Once | INTRAVENOUS | Status: AC | PRN
  Filled 2022-09-20: qty 5

## 2022-09-20 MED ORDER — ATROPINE SULFATE 1 MG/ML IV SOLN
0.5000 mg | Freq: Once | INTRAVENOUS | Status: AC | PRN
  Administered 2022-09-20: 0.5 mg via INTRAVENOUS
  Filled 2022-09-20: qty 1

## 2022-09-20 MED ORDER — SODIUM CHLORIDE 0.9 % IV SOLN
125.0000 mg/m2 | Freq: Once | INTRAVENOUS | Status: DC
  Filled 2022-09-20: qty 11

## 2022-09-20 MED ORDER — HEPARIN SOD (PORK) LOCK FLUSH 100 UNIT/ML IV SOLN
INTRAVENOUS | Status: AC
  Administered 2022-09-20: 500 [IU]
  Filled 2022-09-20: qty 5

## 2022-09-20 MED ORDER — SODIUM CHLORIDE 0.9 % IV SOLN
125.0000 mg/m2 | Freq: Once | INTRAVENOUS | Status: AC
  Administered 2022-09-20: 220 mg via INTRAVENOUS
  Filled 2022-09-20: qty 11

## 2022-09-20 MED ORDER — SODIUM CHLORIDE 0.9 % IV SOLN
Freq: Once | INTRAVENOUS | Status: AC
  Filled 2022-09-20: qty 250

## 2022-09-20 NOTE — Patient Instructions (Signed)
[facility] CANCER CTR AT [place]  Discharge Instructions: Thank you for choosing [facility] to provide your oncology and hematology care.  If you have a lab appointment with the [facility], please go directly to the [facility] and check in at the registration area.  Wear comfortable clothing and clothing appropriate for easy access to any Portacath or PICC line.   We strive to give you quality time with your provider. You may need to reschedule your appointment if you arrive late (15 or more minutes).  Arriving late affects you and other patients whose appointments are after yours.  Also, if you miss three or more appointments without notifying the office, you may be dismissed from the clinic at the provider's discretion.      For prescription refill requests, have your pharmacy contact our office and allow 72 hours for refills to be completed.    Today you received the following chemotherapy and/or immunotherapy agents Vectibix and Irinotecan       To help prevent nausea and vomiting after your treatment, we encourage you to take your nausea medication as directed.  BELOW ARE SYMPTOMS THAT SHOULD BE REPORTED IMMEDIATELY: *FEVER GREATER THAN 100.4 F (38 C) OR HIGHER *CHILLS OR SWEATING *NAUSEA AND VOMITING THAT IS NOT CONTROLLED WITH YOUR NAUSEA MEDICATION *UNUSUAL SHORTNESS OF BREATH *UNUSUAL BRUISING OR BLEEDING *URINARY PROBLEMS (pain or burning when urinating, or frequent urination) *BOWEL PROBLEMS (unusual diarrhea, constipation, pain near the anus) TENDERNESS IN MOUTH AND THROAT WITH OR WITHOUT PRESENCE OF ULCERS (sore throat, sores in mouth, or a toothache) UNUSUAL RASH, SWELLING OR PAIN  UNUSUAL VAGINAL DISCHARGE OR ITCHING   Items with * indicate a potential emergency and should be followed up as soon as possible or go to the Emergency Department if any problems should occur.  Please show the CHEMOTHERAPY ALERT CARD or IMMUNOTHERAPY ALERT CARD  at check-in to the Emergency Department and triage nurse.  Should you have questions after your visit or need to cancel or reschedule your appointment, please contact [facility] CANCER [place] AT [place]  [phone] and follow the prompts.  Office hours are 8:00 a.m. to 4:30 p.m. Monday - Friday. Please note that voicemails left after 4:00 p.m. may not be returned until the following business day.  We are closed weekends and major holidays. You have access to a nurse at all times for urgent questions. Please call the main number to the clinic [phone] and follow the prompts.  For any non-urgent questions, you may also contact your provider using MyChart. We now offer e-Visits for anyone 73 and older to request care online for non-urgent symptoms. For details visit [email or web address].   Also download the MyChart app! Go to the app store, search "MyChart", open the app, select , and log in with your MyChart username and password.  Masks are optional in the cancer centers. If you would like for your care team to wear a mask while they are taking care of you, please let them know. For doctor visits, patients may have with them one support person who is at least 73 years old. At this time, visitors are not allowed in the infusion area.

## 2022-09-20 NOTE — Progress Notes (Signed)
Hematology/Oncology Consult note [facility]  [phone] [phone]  Patient Care Team: Jodi Marble, MD as PCP - General (Internal Medicine) Clent Jacks, RN as Oncology Nurse Navigator Sindy Guadeloupe, MD as Consulting Physician (Hematology and Oncology)   Name of the patient: Eric Simon  [id number]  09/26/1951   Date of visit: 09/20/22  Diagnosis- metastatic colon cancer with metastases to intra-abdominal and mediastinal lymph nodes    Chief complaint/ Reason for visit-on treatment assessment prior to next cycle of panitumumab irinotecan chemotherapy  Heme/Onc history: Patient is a 71 yr old male with >40 pack year history of smoking. He currently smokes 0.5ppd. he presented to the ER with symptoms of left sided chest pain and left arm pain. Troponin was negative ekg was unremarkable. Ct chest showed no PE. He was incidentally noted to have mediastinal and retrocrural adenopathy and a 2.1X2.3X4.4 cm retroaortic soft tissue lesion in the posterior left chest. 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  Low-grade activity in the left lateral fifth and sixth ribs associated with nondisplaced fractures likely reflecting healing response problem malignancy.   Patient underwent CT-guided biopsy of the retroperitoneal lymph node pathology showed metastatic adenocarcinoma compatible with colorectal origin.  CK7 negative.  CK20 positive.  CDX 2+.  TTF-1 negative.  PSA negative.  This pattern of immunoreactivity supports the above diagnosis. Patient underwent colonoscopy which showed sigmoid mass that was consistent with adenocarcinoma.  RAS panel testing showed that he was wild-type for both K-ras and BRAF   FOLFOX and bevacizumab chemotherapy started in July 2020.  Subsequently oxaliplatin has been on hold given neuropathy.   Recent scans have shown slow increase in the size of intra-abdominal adenopathy but patient continues to have low-volume disease.plan is to switch him to second line Xeloda irinotecan panitumumab  regimen.  Patient received palliative radiation therapy to the enlarging external iliac lymph nodes    Interval history-patient feels back to his baseline.  Denies any abdominal pain or diarrhea.  ECOG PS- 0 Pain scale- 0   Review of systems- Review of Systems  Constitutional:  Negative for chills, fever, malaise/fatigue and weight loss.  HENT:  Negative for congestion, ear discharge and nosebleeds.   Eyes:  Negative for blurred vision.  Respiratory:  Negative for cough, hemoptysis, sputum production, shortness of breath and wheezing.   Cardiovascular:  Negative for chest pain, palpitations, orthopnea and claudication.  Gastrointestinal:  Negative for abdominal pain, blood in stool, constipation, diarrhea, heartburn, melena, nausea and vomiting.  Genitourinary:  Negative for dysuria, flank pain, frequency, hematuria and urgency.  Musculoskeletal:  Negative for back pain, joint pain and myalgias.  Skin:  Negative for rash.  Neurological:  Negative for dizziness, tingling, focal weakness, seizures, weakness and headaches.  Endo/Heme/Allergies:  Does not bruise/bleed easily.  Psychiatric/Behavioral:  Negative for depression and suicidal ideas. The patient does not have insomnia.       No Known Allergies   Past Medical History:  Diagnosis Date   Colon cancer ([place])    COPD (chronic obstructive pulmonary disease) ([place])    Hyperlipidemia      Past Surgical History:  Procedure Laterality Date   COLONOSCOPY WITH PROPOFOL N/A 06/26/2019   Procedure: COLONOSCOPY WITH PROPOFOL;  Surgeon: [name], MD;  Location: [facility] ENDOSCOPY;  Service: Gastroenterology;  Laterality: N/A;   FLEXIBLE SIGMOIDOSCOPY N/A 05/07/2021   Procedure: FLEXIBLE SIGMOIDOSCOPY;  Surgeon: [name], MD;  Location: [facility]  ENDOSCOPY;  Service: Gastroenterology;  Laterality: N/A;   PORTA CATH INSERTION N/A 06/25/2019   Procedure: PORTA CATH INSERTION;  Surgeon: Algernon Huxley, MD;  Location: [place] CV LAB;  Service: Cardiovascular;  Laterality: N/A;   PORTA CATH INSERTION Left 08/11/2020   Procedure: PORTA CATH INSERTION;  Surgeon: Algernon Huxley, MD;  Location: [place] [facility] LAB;  Service: Cardiovascular;  Laterality: Left;   PORTA CATH REMOVAL Right 08/11/2020   Procedure: PORTA CATH REMOVAL;  Surgeon: Algernon Huxley, MD;  Location: [place] [facility] LAB;  Service: Cardiovascular;  Laterality: Right;    Social History   Socioeconomic History   Marital status: Single    Spouse name: Not on file   Number of children: Not on file   Years of education: Not on file   Highest education level: Not on file  Occupational History   Not on file  Tobacco Use   Smoking status: Every Day    Packs/day: 0.50    Years: 40.00    Total pack years: 20.00    Types: Cigarettes   Smokeless tobacco: Never   Tobacco comments:    smoking 40 years  Vaping Use   Vaping Use: Never used  Substance and Sexual Activity   Alcohol use: No   Drug use: No   Sexual activity: Not Currently  Other Topics Concern   Not on file  Social History Narrative   Not on file   Social Determinants of Health   Financial Resource Strain: Low Risk  (06/12/2019)   Overall Financial Resource Strain (CARDIA)    Difficulty of Paying Living Expenses: Not hard at all  Food Insecurity: No Food Insecurity (06/12/2019)   Hunger Vital Sign    Worried About Running Out of Food in the Last Year: Never true    Ran Out of Food in the Last Year: Never true  Transportation Needs: No Transportation Needs (06/12/2019)   PRAPARE - Hydrologist (Medical): No    Lack of Transportation (Non-Medical): No  Physical Activity: Not on file  Stress: No Stress Concern Present (06/12/2019)   [place]    Feeling of Stress : Not at all  Social Connections: Unknown (06/12/2019)   Social Connection and Isolation Panel [NHANES]    Frequency of Communication with Friends and Family: More than three times a week    Frequency of Social Gatherings with Friends and Family: Not on file    Attends Religious Services: Not on file    Active Member of [place] or Organizations: Not on file    Attends Archivist Meetings: Not on file    Marital Status: Not on file  Intimate Partner Violence: Not At Risk (06/12/2019)   Humiliation, Afraid, Rape, and Kick questionnaire    Fear of Current or Ex-Partner: No    Emotionally Abused: No    Physically Abused: No    Sexually Abused: No    Family History  Problem Relation Age of Onset   Brain cancer Father      Current Outpatient Medications:    allopurinol (ZYLOPRIM) 100 MG tablet, Take 100 mg by mouth daily., Disp: , Rfl:    aspirin EC 81 MG tablet, Take 81 mg by mouth daily., Disp: , Rfl:    azelastine (ASTELIN) 0.1 % nasal spray, Place 1 spray into both nostrils daily as needed., Disp: , Rfl:  butalbital-acetaminophen-caffeine (FIORICET) 50-325-40 MG tablet, Take 1 tablet by mouth every 4 (four) hours as needed., Disp: , Rfl:    capecitabine (XELODA) 500 MG tablet, Take 1 tablet (500 mg total) by mouth 2 (two) times daily after a meal. Take for 14 days, then hold for 7 days. Repeat every 21 days., Disp: 28 tablet, Rfl: 0   cetirizine (ZYRTEC) 10 MG tablet, Take 1 tablet (10 mg total) by mouth daily., Disp: 30 tablet, Rfl: 0   chlorthalidone (HYGROTON) 25 MG tablet, Take 1 tablet by mouth daily., Disp: , Rfl:    cyclobenzaprine (FLEXERIL) 10 MG tablet, TAKE 1 TABLET(10 MG) BY MOUTH THREE TIMES DAILY AS NEEDED FOR MUSCLE SPASMS, Disp: 42 tablet, Rfl: 0   DEXILANT 60 MG capsule, Take 60 mg by mouth daily., Disp: , Rfl:    diphenoxylate-atropine (LOMOTIL) 2.5-0.025 MG tablet, Take 1 tablet by mouth 4 (four) times  daily as needed for diarrhea or loose stools., Disp: 30 tablet, Rfl: 0   DULoxetine (CYMBALTA) 30 MG capsule, Take 1 capsule (30 mg total) by mouth 2 (two) times daily., Disp: 60 capsule, Rfl: 1   fluticasone (FLONASE) 50 MCG/ACT nasal spray, Place 2 sprays into both nostrils daily as needed., Disp: , Rfl:    lidocaine-prilocaine (EMLA) cream, Apply 1 application  topically as needed (pt will put 1 inch of cream over port 1 hourfor each treatment, cover plastic over the cream protect clothes)., Disp: 30 g, Rfl: 3   lisinopril (ZESTRIL) 5 MG tablet, Take 5 mg by mouth daily., Disp: , Rfl:    montelukast (SINGULAIR) 10 MG tablet, Take 10 mg by mouth at bedtime., Disp: , Rfl:    NEXLETOL 180 MG TABS, Take 1 tablet by mouth daily., Disp: , Rfl:    pregabalin (LYRICA) 75 MG capsule, TAKE 1 CAPSULE(75 MG) BY MOUTH TWICE DAILY, Disp: 60 capsule, Rfl: 2   simvastatin (ZOCOR) 20 MG tablet, Take 20 mg by mouth daily. , Disp: , Rfl:    SYMBICORT 80-4.5 MCG/ACT inhaler, Inhale 2 puffs into the lungs daily., Disp: , Rfl:    tamsulosin (FLOMAX) 0.4 MG CAPS capsule, Take 0.4 mg by mouth. , Disp: , Rfl:    magic mouthwash (multi-ingredient) oral suspension, Swish and spit 5-10 mls by mouth 4 times a day as needed (Patient not taking: Reported on 09/16/2022), Disp: 480 mL, Rfl: 3   oxyCODONE (OXY IR/ROXICODONE) 5 MG immediate release tablet, Take 1 tablet (5 mg total) by mouth every 6 (six) hours as needed for severe pain. (Patient not taking: Reported on 04/05/2022), Disp: 30 tablet, Rfl: 0 No current facility-administered medications for this visit.  Facility-Administered Medications Ordered in Other Visits:    atropine 1 MG/ML injection, , , ,    dexamethasone (DECADRON) 10 mg in sodium chloride 0.9 % 50 mL IVPB, 10 mg, Intravenous, Once, Sindy Guadeloupe, MD, Last Rate: 204 mL/hr at 09/20/22 0943, 10 mg at 09/20/22 0943   heparin lock flush 100 UNIT/ML injection, , , ,    heparin lock flush 100 UNIT/ML injection,  , , ,    heparin lock flush 100 unit/mL, 500 Units, Intracatheter, Once PRN, Sindy Guadeloupe, MD   irinotecan (CAMPTOSAR) 220 mg in sodium chloride 0.9 % 500 mL chemo infusion, 125 mg/m2 (Treatment Plan Recorded), Intravenous, Once, Sindy Guadeloupe, MD   panitumumab (VECTIBIX) 400 mg in sodium chloride 0.9 % 100 mL chemo infusion, 6 mg/kg (Treatment Plan Recorded), Intravenous, Once, Sindy Guadeloupe, MD  Physical exam:  Vitals:   09/20/22 0853  BP: 130/63  Pulse: 70  Temp: (!) 96.8 F (36 C)  TempSrc: Tympanic  Weight: 155 lb (70.3 kg)   Physical Exam Constitutional:      General: He is not in acute distress. Cardiovascular:     Rate and Rhythm: Normal rate and regular rhythm.     Heart sounds: Normal heart sounds.  Pulmonary:     Effort: Pulmonary effort is normal.  Skin:    General: Skin is warm and dry.  Neurological:     Mental Status: He is alert and oriented to person, place, and time.         Latest Ref Rng & Units 08/30/2022    8:15 AM  CMP  Glucose 70 - 99 mg/dL 152   BUN 8 - 23 mg/dL 23   Creatinine 0.61 - 1.24 mg/dL 0.92   Sodium 135 - 145 mmol/L 137   Potassium 3.5 - 5.1 mmol/L 3.2   Chloride 98 - 111 mmol/L 107   CO2 22 - 32 mmol/L 23   Calcium 8.9 - 10.3 mg/dL 8.7   Total Protein 6.5 - 8.1 g/dL 6.9   Total Bilirubin 0.3 - 1.2 mg/dL 0.6   Alkaline Phos 38 - 126 U/L 107   AST 15 - 41 U/L 32   ALT 0 - 44 U/L 21       Latest Ref Rng & Units 09/20/2022    8:32 AM  CBC  WBC 4.0 - 10.5 K/uL 4.6   Hemoglobin 13.0 - 17.0 g/dL 13.4   Hematocrit 39.0 - 52.0 % 37.6   Platelets 150 - 400 K/uL 113     No images are attached to the encounter.  CT CHEST ABDOMEN PELVIS W CONTRAST  Result Date: 09/18/2022 CLINICAL DATA:  Metastatic colon cancer restaging, ongoing chemotherapy * Tracking Code: BO * EXAM: CT CHEST, ABDOMEN, AND PELVIS WITH CONTRAST TECHNIQUE: Multidetector CT imaging of the chest, abdomen and pelvis was performed following the standard protocol  during bolus administration of intravenous contrast. RADIATION DOSE REDUCTION: This exam was performed according to the departmental dose-optimization program which includes automated exposure control, adjustment of the mA and/or kV according to patient size and/or use of iterative reconstruction technique. CONTRAST:  116m OMNIPAQUE IOHEXOL 300 MG/ML SOLN additional oral enteric contrast COMPARISON:  PET-CT, 07/12/2022 FINDINGS: CT CHEST FINDINGS Cardiovascular: Left chest port catheter. Aortic atherosclerosis. Normal heart size. Left coronary artery calcifications. No pericardial effusion. Mediastinum/Nodes: No enlarged mediastinal, hilar, or axillary lymph nodes. Thyroid gland, trachea, and esophagus demonstrate no significant findings. Lungs/Pleura: Severe centrilobular and paraseptal emphysema. Diffuse bilateral bronchial wall thickening. No pleural effusion or pneumothorax. Musculoskeletal: No chest wall abnormality. No acute osseous findings. CT ABDOMEN PELVIS FINDINGS Hepatobiliary: No solid liver abnormality is seen. Somewhat coarse contour of the liver. Contracted gallbladder. No gallstones, gallbladder wall thickening, or biliary dilatation. Pancreas: Unremarkable. No pancreatic ductal dilatation or surrounding inflammatory changes. Spleen: Mild splenomegaly, maximum coronal span 13.4 cm. Adrenals/Urinary Tract: Adrenal glands are unremarkable. Kidneys are normal, without renal calculi, solid lesion, or hydronephrosis. Bladder is unremarkable. Stomach/Bowel: Stomach is within normal limits. Appendix appears normal. No evidence of bowel wall thickening, distention, or inflammatory changes. Sigmoid diverticula. Vascular/Lymphatic: Aortic atherosclerosis. Unchanged enlarged aortocaval lymph node measuring up to 1.5 x 1.2 cm, not previously FDG avid (series 2, image 69). Significant interval decrease in size in left internal and external iliac lymph nodes, index left external iliac node measuring up to 2.2 x  1.4 cm, previously 2.9  x 2.9 cm (series 2, image 102), index left internal iliac/pelvic sidewall node measuring 1.3 x 0.9 cm, previously 2.4 x 1.9 cm (series 2, image 108). Reproductive: Prostatomegaly. Other: Small, fat containing umbilical hernia (series 2, image 93). Trace ascites in the low pelvis (series 2, image 106) Musculoskeletal: No acute osseous findings. IMPRESSION: 1. Significant interval decrease in size in left internal and external iliac lymph nodes, consistent with treatment response of previously FDG avid nodal metastatic disease. Enlarged aortocaval node is unchanged, however not previously FDG avid. 2. No evidence of new metastatic disease in the chest, abdomen, or pelvis. 3. Somewhat coarse contour of the liver, suggestive of cirrhosis. Mild splenomegaly. 4. Trace ascites in the low pelvis. 5. Emphysema and diffuse bilateral bronchial wall thickening. 6. Coronary artery disease. 7. Diverticulosis without evidence of acute diverticulitis. Aortic Atherosclerosis (ICD10-I70.0) and Emphysema (ICD10-J43.9). Electronically Signed   By: Delanna Ahmadi M.D.   On: 09/18/2022 20:02     Assessment and plan- Patient is a 71 y.o. male with metastatic colon cancer and intra-abdominal metastases stage IV.  He is here for on treatment assessment prior to cycle 17 of panitumumab irinotecan chemotherapy and discuss CT scan results And further management  I have reviewed CT chest abdomen and pelvis images and plan discussed findings with the patient.  After receiving palliative radiation there has been interval decrease in the size of external iliac adenopathy from 2.9 x 2.9 cm to 2.2 x 1.4 cm.  Internal iliac lymph node which was previously 2.4 x 1.9 cm is now 1.3 x 0.9 cm.  Aortocaval lymph node remains unchanged.  No other signs of progressive or recurrent disease.  CEA has also come down to 5.8 from a prior value of 15.9.  He will therefore continue with panitumumab irinotecan Xeloda every 3 weeks until  progression or toxicity with cycle 17 being today.  I will see him back in 3 weeks for cycle 18.  I will continue to monitor his CEA every 3 weeks   Visit Diagnosis 1. 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 ([place])   2. High risk medication use   3. Encounter for monoclonal antibody treatment for malignancy   4. Encounter for antineoplastic chemotherapy      Dr. Randa Evens, MD, MPH [facility] [phone] 09/20/2022 9:54 AM

## 2022-09-21 LAB — CEA: CEA: 4.9 ng/mL — ABNORMAL HIGH (ref 0.0–4.7)

## 2022-09-30 ENCOUNTER — Other Ambulatory Visit: Payer: Self-pay | Admitting: Oncology

## 2022-09-30 ENCOUNTER — Other Ambulatory Visit (HOSPITAL_COMMUNITY): Payer: Self-pay

## 2022-09-30 DIAGNOSIS — C189 Malignant neoplasm of colon, unspecified: Secondary | ICD-10-CM

## 2022-10-01 ENCOUNTER — Encounter: Payer: Self-pay | Admitting: Oncology

## 2022-10-01 ENCOUNTER — Other Ambulatory Visit (HOSPITAL_COMMUNITY): Payer: Self-pay

## 2022-10-01 MED ORDER — CAPECITABINE 500 MG PO TABS
500.0000 mg | ORAL_TABLET | Freq: Two times a day (BID) | ORAL | 0 refills | Status: DC
  Filled 2022-10-01: qty 28, 21d supply, fill #0

## 2022-10-01 NOTE — Progress Notes (Signed)
Enrolled patient into the [employer] and [employer].

## 2022-10-04 ENCOUNTER — Other Ambulatory Visit: Payer: Medicare HMO

## 2022-10-05 ENCOUNTER — Other Ambulatory Visit (HOSPITAL_COMMUNITY): Payer: Self-pay

## 2022-10-08 MED FILL — Dexamethasone Sodium Phosphate Inj 100 MG/10ML: INTRAMUSCULAR | Qty: 1 | Status: AC

## 2022-10-11 ENCOUNTER — Encounter (INDEPENDENT_AMBULATORY_CARE_PROVIDER_SITE_OTHER): Payer: Self-pay

## 2022-10-11 ENCOUNTER — Inpatient Hospital Stay (HOSPITAL_BASED_OUTPATIENT_CLINIC_OR_DEPARTMENT_OTHER): Payer: Medicare HMO | Admitting: Oncology

## 2022-10-11 ENCOUNTER — Inpatient Hospital Stay: Payer: Medicare HMO

## 2022-10-11 ENCOUNTER — Encounter: Payer: Self-pay | Admitting: Oncology

## 2022-10-11 VITALS — BP 99/63 | HR 72 | Resp 18 | Wt 158.1 lb

## 2022-10-11 DIAGNOSIS — C772 Secondary and unspecified malignant neoplasm of intra-abdominal lymph nodes: Secondary | ICD-10-CM

## 2022-10-11 DIAGNOSIS — C189 Malignant neoplasm of colon, unspecified: Secondary | ICD-10-CM

## 2022-10-11 DIAGNOSIS — Z5111 Encounter for antineoplastic chemotherapy: Secondary | ICD-10-CM | POA: Diagnosis not present

## 2022-10-11 DIAGNOSIS — G629 Polyneuropathy, unspecified: Secondary | ICD-10-CM | POA: Diagnosis not present

## 2022-10-11 DIAGNOSIS — Z79899 Other long term (current) drug therapy: Secondary | ICD-10-CM | POA: Diagnosis not present

## 2022-10-11 DIAGNOSIS — C786 Secondary malignant neoplasm of retroperitoneum and peritoneum: Secondary | ICD-10-CM | POA: Diagnosis not present

## 2022-10-11 DIAGNOSIS — Z5112 Encounter for antineoplastic immunotherapy: Secondary | ICD-10-CM | POA: Diagnosis not present

## 2022-10-11 LAB — CBC WITH DIFFERENTIAL/PLATELET
Abs Immature Granulocytes: 0.03 10*3/uL (ref 0.00–0.07)
Basophils Absolute: 0.1 10*3/uL (ref 0.0–0.1)
Basophils Relative: 1 %
Eosinophils Absolute: 0.2 10*3/uL (ref 0.0–0.5)
Eosinophils Relative: 5 %
HCT: 36.3 % — ABNORMAL LOW (ref 39.0–52.0)
Hemoglobin: 12.7 g/dL — ABNORMAL LOW (ref 13.0–17.0)
Immature Granulocytes: 1 %
Lymphocytes Relative: 21 %
Lymphs Abs: 0.9 10*3/uL (ref 0.7–4.0)
MCH: 33.7 pg (ref 26.0–34.0)
MCHC: 35 g/dL (ref 30.0–36.0)
MCV: 96.3 fL (ref 80.0–100.0)
Monocytes Absolute: 0.6 10*3/uL (ref 0.1–1.0)
Monocytes Relative: 14 %
Neutro Abs: 2.5 10*3/uL (ref 1.7–7.7)
Neutrophils Relative %: 58 %
Platelets: 111 10*3/uL — ABNORMAL LOW (ref 150–400)
RBC: 3.77 MIL/uL — ABNORMAL LOW (ref 4.22–5.81)
RDW: 15.7 % — ABNORMAL HIGH (ref 11.5–15.5)
WBC: 4.3 10*3/uL (ref 4.0–10.5)
nRBC: 0 % (ref 0.0–0.2)

## 2022-10-11 LAB — COMPREHENSIVE METABOLIC PANEL
ALT: 18 U/L (ref 0–44)
AST: 30 U/L (ref 15–41)
Albumin: 3.7 g/dL (ref 3.5–5.0)
Alkaline Phosphatase: 122 U/L (ref 38–126)
Anion gap: 4 — ABNORMAL LOW (ref 5–15)
BUN: 29 mg/dL — ABNORMAL HIGH (ref 8–23)
CO2: 22 mmol/L (ref 22–32)
Calcium: 8.5 mg/dL — ABNORMAL LOW (ref 8.9–10.3)
Chloride: 107 mmol/L (ref 98–111)
Creatinine, Ser: 1.02 mg/dL (ref 0.61–1.24)
GFR, Estimated: >60 mL/min (ref >60)
Glucose, Bld: 100 mg/dL — ABNORMAL HIGH (ref 70–99)
Potassium: 3.9 mmol/L (ref 3.5–5.1)
Sodium: 133 mmol/L — ABNORMAL LOW (ref 135–145)
Total Bilirubin: 0.7 mg/dL (ref 0.3–1.2)
Total Protein: 7.1 g/dL (ref 6.5–8.1)

## 2022-10-11 LAB — MAGNESIUM: Magnesium: 2 mg/dL (ref 1.7–2.4)

## 2022-10-11 NOTE — Progress Notes (Signed)
Pts BP is 99/63 amd states he feels a little dizziness but it is not out of the ordinary for him.

## 2022-10-11 NOTE — Progress Notes (Signed)
Hematology/Oncology Consult note [facility]  [phone] [phone]  Patient Care Team: Jodi Marble, MD as PCP - General (Internal Medicine) Clent Jacks, RN as Oncology Nurse Navigator Sindy Guadeloupe, MD as Consulting Physician (Hematology and Oncology)   Name of the patient: Eric Simon  [id number]  1951-04-24   Date of visit: 10/11/22  Diagnosis- metastatic colon cancer with metastases to intra-abdominal and mediastinal lymph nodes    Chief complaint/ Reason for visit-on treatment assessment prior to next cycle of panitumumab irinotecan chemotherapy  Heme/Onc history: Patient is a 71 yr old male with >40 pack year history of smoking. He currently smokes 0.5ppd. he presented to the ER with symptoms of left sided chest pain and left arm pain. Troponin was negative ekg was unremarkable. Ct chest showed no PE. He was incidentally noted to have mediastinal and retrocrural adenopathy and a 2.1X2.3X4.4 cm retroaortic soft tissue lesion in the posterior left chest. 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  Low-grade activity in the left lateral fifth and sixth ribs associated with nondisplaced fractures likely reflecting healing response problem malignancy.   Patient underwent CT-guided biopsy of the retroperitoneal lymph node pathology showed metastatic adenocarcinoma compatible with colorectal origin.  CK7 negative.  CK20 positive.  CDX 2+.  TTF-1 negative.  PSA negative.  This pattern of immunoreactivity supports the above diagnosis. Patient underwent colonoscopy which showed sigmoid mass that was consistent with adenocarcinoma.  RAS panel testing showed that he was wild-type for both K-ras and BRAF   FOLFOX and bevacizumab chemotherapy started in July 2020.  Subsequently oxaliplatin has been on hold given neuropathy.   Recent scans have shown slow increase in the size of intra-abdominal adenopathy but patient continues to have low-volume disease.plan is to switch him to second line Xeloda irinotecan panitumumab  regimen.  Patient received palliative radiation therapy to the enlarging external iliac lymph nodes  Interval history-patient reports symptoms of sinus congestion that has been ongoing for the last few days.  States she does not feel well overall today.  Occasional lightheadedness.  He would like to skip treatment today.  Denies any fevers  ECOG PS- 1 Pain scale- 0   Review of systems- Review of Systems  Constitutional:  Positive for malaise/fatigue. Negative for chills, fever and weight loss.  HENT:  Positive for congestion. Negative for ear discharge and nosebleeds.   Eyes:  Negative for blurred vision.  Respiratory:  Negative for cough, hemoptysis, sputum production, shortness of breath and wheezing.   Cardiovascular:  Negative for chest pain, palpitations, orthopnea and claudication.  Gastrointestinal:  Negative for abdominal pain, blood in stool, constipation, diarrhea, heartburn, melena, nausea and vomiting.  Genitourinary:  Negative for dysuria, flank pain, frequency, hematuria and urgency.  Musculoskeletal:  Negative for back pain, joint pain and myalgias.  Skin:  Negative for rash.  Neurological:  Positive for dizziness. Negative for tingling, focal weakness, seizures, weakness and headaches.  Endo/Heme/Allergies:  Does not bruise/bleed easily.  Psychiatric/Behavioral:  Negative for depression and suicidal ideas. The patient does not have insomnia.       No Known Allergies   Past Medical History:  Diagnosis Date   Colon cancer ([place])    COPD (chronic obstructive pulmonary disease) ([place])    Hyperlipidemia      Past Surgical History:  Procedure Laterality Date   COLONOSCOPY WITH PROPOFOL N/A 06/26/2019   Procedure:  COLONOSCOPY WITH PROPOFOL;  Surgeon: [name], MD;  Location:  [facility] ENDOSCOPY;  Service: Gastroenterology;  Laterality: N/A;   FLEXIBLE SIGMOIDOSCOPY N/A 05/07/2021   Procedure: FLEXIBLE SIGMOIDOSCOPY;  Surgeon: [name], MD;  Location: [facility] ENDOSCOPY;  Service: Gastroenterology;  Laterality: N/A;   PORTA CATH INSERTION N/A 06/25/2019   Procedure: PORTA CATH INSERTION;  Surgeon: Algernon Huxley, MD;  Location: [place] CV LAB;  Service: Cardiovascular;  Laterality: N/A;   PORTA CATH INSERTION Left 08/11/2020   Procedure: PORTA CATH INSERTION;  Surgeon: Algernon Huxley, MD;  Location: [place] CV LAB;  Service: Cardiovascular;  Laterality: Left;   PORTA CATH REMOVAL Right 08/11/2020   Procedure: PORTA CATH REMOVAL;  Surgeon: Algernon Huxley, MD;  Location: [place] CV LAB;  Service: Cardiovascular;  Laterality: Right;    Social History   Socioeconomic History   Marital status: Single    Spouse name: Not on file   Number of children: Not on file   Years of education: Not on file   Highest education level: Not on file  Occupational History   Not on file  Tobacco Use   Smoking status: Every Day    Packs/day: 0.50    Years: 40.00    Total pack years: 20.00    Types: Cigarettes   Smokeless tobacco: Never   Tobacco comments:    smoking 40 years  Vaping Use   Vaping Use: Never used  Substance and Sexual Activity   Alcohol use: No   Drug use: No   Sexual activity: Not Currently  Other Topics Concern   Not on file  Social History Narrative   Not on file   Social Determinants of Health   Financial Resource Strain: Low Risk  (06/12/2019)   Overall Financial Resource Strain (CARDIA)    Difficulty of Paying Living Expenses: Not hard at all  Food Insecurity: No Food Insecurity (06/12/2019)   Hunger Vital Sign    Worried About Running Out of Food in the Last Year: Never true    Ran Out of Food in the Last Year: Never true  Transportation Needs: No Transportation Needs (06/12/2019)   PRAPARE - Hydrologist  (Medical): No    Lack of Transportation (Non-Medical): No  Physical Activity: Not on file  Stress: No Stress Concern Present (06/12/2019)   [place]    Feeling of Stress : Not at all  Social Connections: Unknown (06/12/2019)   Social Connection and Isolation Panel [NHANES]    Frequency of Communication with Friends and Family: More than three times a week    Frequency of Social Gatherings with Friends and Family: Not on file    Attends Religious Services: Not on file    Active Member of [place] or Organizations: Not on file    Attends Archivist Meetings: Not on file    Marital Status: Not on file  Intimate Partner Violence: Not At Risk (06/12/2019)   Humiliation, Afraid, Rape, and Kick questionnaire    Fear of Current or Ex-Partner: No    Emotionally Abused: No    Physically Abused: No    Sexually Abused: No    Family History  Problem Relation Age of Onset   Brain cancer Father      Current Outpatient Medications:    allopurinol (ZYLOPRIM) 100 MG tablet, Take 100 mg by mouth daily., Disp: , Rfl:    aspirin EC 81 MG tablet, Take 81  mg by mouth daily., Disp: , Rfl:    azelastine (ASTELIN) 0.1 % nasal spray, Place 1 spray into both nostrils daily as needed., Disp: , Rfl:    butalbital-acetaminophen-caffeine (FIORICET) 50-325-40 MG tablet, Take 1 tablet by mouth every 4 (four) hours as needed., Disp: , Rfl:    capecitabine (XELODA) 500 MG tablet, Take 1 tablet (500 mg total) by mouth 2 (two) times daily after a meal. Take for 14 days, then hold for 7 days. Repeat every 21 days., Disp: 28 tablet, Rfl: 0   cetirizine (ZYRTEC) 10 MG tablet, Take 1 tablet (10 mg total) by mouth daily., Disp: 30 tablet, Rfl: 0   chlorthalidone (HYGROTON) 25 MG tablet, Take 1 tablet by mouth daily., Disp: , Rfl:    DEXILANT 60 MG capsule, Take 60 mg by mouth daily., Disp: , Rfl:    diphenoxylate-atropine (LOMOTIL) 2.5-0.025 MG  tablet, Take 1 tablet by mouth 4 (four) times daily as needed for diarrhea or loose stools., Disp: 30 tablet, Rfl: 0   DULoxetine (CYMBALTA) 30 MG capsule, Take 1 capsule (30 mg total) by mouth 2 (two) times daily., Disp: 60 capsule, Rfl: 1   fluticasone (FLONASE) 50 MCG/ACT nasal spray, Place 2 sprays into both nostrils daily as needed., Disp: , Rfl:    lidocaine-prilocaine (EMLA) cream, Apply 1 application  topically as needed (pt will put 1 inch of cream over port 1 hourfor each treatment, cover plastic over the cream protect clothes)., Disp: 30 g, Rfl: 3   lisinopril (ZESTRIL) 5 MG tablet, Take 5 mg by mouth daily., Disp: , Rfl:    montelukast (SINGULAIR) 10 MG tablet, Take 10 mg by mouth at bedtime., Disp: , Rfl:    NEXLETOL 180 MG TABS, Take 1 tablet by mouth daily., Disp: , Rfl:    pregabalin (LYRICA) 75 MG capsule, TAKE 1 CAPSULE(75 MG) BY MOUTH TWICE DAILY, Disp: 60 capsule, Rfl: 2   simvastatin (ZOCOR) 20 MG tablet, Take 20 mg by mouth daily. , Disp: , Rfl:    SYMBICORT 80-4.5 MCG/ACT inhaler, Inhale 2 puffs into the lungs daily., Disp: , Rfl:    tamsulosin (FLOMAX) 0.4 MG CAPS capsule, Take 0.4 mg by mouth. , Disp: , Rfl:    cyclobenzaprine (FLEXERIL) 10 MG tablet, TAKE 1 TABLET(10 MG) BY MOUTH THREE TIMES DAILY AS NEEDED FOR MUSCLE SPASMS (Patient not taking: Reported on 10/11/2022), Disp: 42 tablet, Rfl: 0   magic mouthwash (multi-ingredient) oral suspension, Swish and spit 5-10 mls by mouth 4 times a day as needed (Patient not taking: Reported on 09/16/2022), Disp: 480 mL, Rfl: 3   oxyCODONE (OXY IR/ROXICODONE) 5 MG immediate release tablet, Take 1 tablet (5 mg total) by mouth every 6 (six) hours as needed for severe pain. (Patient not taking: Reported on 04/05/2022), Disp: 30 tablet, Rfl: 0 No current facility-administered medications for this visit.  Facility-Administered Medications Ordered in Other Visits:    atropine 1 MG/ML injection, , , ,    heparin lock flush 100 UNIT/ML  injection, , , ,   Physical exam:  Vitals:   10/11/22 0843  BP: 99/63  Pulse: 72  Resp: 18  SpO2: 97%  Weight: 158 lb 1.6 oz (71.7 kg)   Physical Exam Cardiovascular:     Rate and Rhythm: Normal rate and regular rhythm.     Heart sounds: Normal heart sounds.  Pulmonary:     Effort: Pulmonary effort is normal.     Breath sounds: Normal breath sounds.  Skin:    General:  Skin is warm and dry.  Neurological:     Mental Status: He is alert and oriented to person, place, and time.         Latest Ref Rng & Units 10/11/2022    8:18 AM  CMP  Glucose 70 - 99 mg/dL 100   BUN 8 - 23 mg/dL 29   Creatinine 0.61 - 1.24 mg/dL 1.02   Sodium 135 - 145 mmol/L 133   Potassium 3.5 - 5.1 mmol/L 3.9   Chloride 98 - 111 mmol/L 107   CO2 22 - 32 mmol/L 22   Calcium 8.9 - 10.3 mg/dL 8.5   Total Protein 6.5 - 8.1 g/dL 7.1   Total Bilirubin 0.3 - 1.2 mg/dL 0.7   Alkaline Phos 38 - 126 U/L 122   AST 15 - 41 U/L 30   ALT 0 - 44 U/L 18       Latest Ref Rng & Units 10/11/2022    8:18 AM  CBC  WBC 4.0 - 10.5 K/uL 4.3   Hemoglobin 13.0 - 17.0 g/dL 12.7   Hematocrit 39.0 - 52.0 % 36.3   Platelets 150 - 400 K/uL 111     No images are attached to the encounter.  CT CHEST ABDOMEN PELVIS W CONTRAST  Result Date: 09/18/2022 CLINICAL DATA:  Metastatic colon cancer restaging, ongoing chemotherapy * Tracking Code: BO * EXAM: CT CHEST, ABDOMEN, AND PELVIS WITH CONTRAST TECHNIQUE: Multidetector CT imaging of the chest, abdomen and pelvis was performed following the standard protocol during bolus administration of intravenous contrast. RADIATION DOSE REDUCTION: This exam was performed according to the departmental dose-optimization program which includes automated exposure control, adjustment of the mA and/or kV according to patient size and/or use of iterative reconstruction technique. CONTRAST:  119m OMNIPAQUE IOHEXOL 300 MG/ML SOLN additional oral enteric contrast COMPARISON:  PET-CT, 07/12/2022  FINDINGS: CT CHEST FINDINGS Cardiovascular: Left chest port catheter. Aortic atherosclerosis. Normal heart size. Left coronary artery calcifications. No pericardial effusion. Mediastinum/Nodes: No enlarged mediastinal, hilar, or axillary lymph nodes. Thyroid gland, trachea, and esophagus demonstrate no significant findings. Lungs/Pleura: Severe centrilobular and paraseptal emphysema. Diffuse bilateral bronchial wall thickening. No pleural effusion or pneumothorax. Musculoskeletal: No chest wall abnormality. No acute osseous findings. CT ABDOMEN PELVIS FINDINGS Hepatobiliary: No solid liver abnormality is seen. Somewhat coarse contour of the liver. Contracted gallbladder. No gallstones, gallbladder wall thickening, or biliary dilatation. Pancreas: Unremarkable. No pancreatic ductal dilatation or surrounding inflammatory changes. Spleen: Mild splenomegaly, maximum coronal span 13.4 cm. Adrenals/Urinary Tract: Adrenal glands are unremarkable. Kidneys are normal, without renal calculi, solid lesion, or hydronephrosis. Bladder is unremarkable. Stomach/Bowel: Stomach is within normal limits. Appendix appears normal. No evidence of bowel wall thickening, distention, or inflammatory changes. Sigmoid diverticula. Vascular/Lymphatic: Aortic atherosclerosis. Unchanged enlarged aortocaval lymph node measuring up to 1.5 x 1.2 cm, not previously FDG avid (series 2, image 69). Significant interval decrease in size in left internal and external iliac lymph nodes, index left external iliac node measuring up to 2.2 x 1.4 cm, previously 2.9 x 2.9 cm (series 2, image 102), index left internal iliac/pelvic sidewall node measuring 1.3 x 0.9 cm, previously 2.4 x 1.9 cm (series 2, image 108). Reproductive: Prostatomegaly. Other: Small, fat containing umbilical hernia (series 2, image 93). Trace ascites in the low pelvis (series 2, image 106) Musculoskeletal: No acute osseous findings. IMPRESSION: 1. Significant interval decrease in size in  left internal and external iliac lymph nodes, consistent with treatment response of previously FDG avid nodal metastatic disease. Enlarged aortocaval node  is unchanged, however not previously FDG avid. 2. No evidence of new metastatic disease in the chest, abdomen, or pelvis. 3. Somewhat coarse contour of the liver, suggestive of cirrhosis. Mild splenomegaly. 4. Trace ascites in the low pelvis. 5. Emphysema and diffuse bilateral bronchial wall thickening. 6. Coronary artery disease. 7. Diverticulosis without evidence of acute diverticulitis. Aortic Atherosclerosis (ICD10-I70.0) and Emphysema (ICD10-J43.9). Electronically Signed   By: Delanna Ahmadi M.D.   On: 09/18/2022 20:02     Assessment and plan- Patient is a 71 y.o. male with metastatic colon cancer and intra-abdominal metastases stage IV.  He is here for on treatment assessment prior to cycle 18 of panitumumab irinotecan chemotherapy  Patient wants to skip chemotherapy today as he is generally feeling unwell with symptoms of sinus congestion and occasional lightheadedness.  Denies any fever or falls.  If symptoms get worse he will need to get tested for COVID at home.  I am holding off on chemotherapy today and see him back in 2 weeks for cycle 17.  Not prescribing him any empiric antibiotics at this time as symptoms seem to be viral In origin Visit Diagnosis 1. 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 ([place])      Dr. Randa Evens, MD, MPH [facility] [phone] 10/11/2022 12:20 PM

## 2022-10-13 LAB — CEA: CEA: 4.4 ng/mL (ref 0.0–4.7)

## 2022-10-18 ENCOUNTER — Other Ambulatory Visit (HOSPITAL_COMMUNITY): Payer: Self-pay

## 2022-10-22 MED FILL — Dexamethasone Sodium Phosphate Inj 100 MG/10ML: INTRAMUSCULAR | Qty: 1 | Status: AC

## 2022-10-25 ENCOUNTER — Encounter: Payer: Self-pay | Admitting: Oncology

## 2022-10-25 ENCOUNTER — Inpatient Hospital Stay: Payer: Medicare HMO

## 2022-10-25 ENCOUNTER — Other Ambulatory Visit: Payer: Medicare HMO

## 2022-10-25 ENCOUNTER — Ambulatory Visit: Payer: Medicare HMO | Admitting: Oncology

## 2022-10-25 ENCOUNTER — Inpatient Hospital Stay (HOSPITAL_BASED_OUTPATIENT_CLINIC_OR_DEPARTMENT_OTHER): Payer: Medicare HMO | Admitting: Oncology

## 2022-10-25 ENCOUNTER — Ambulatory Visit: Payer: Medicare HMO

## 2022-10-25 ENCOUNTER — Inpatient Hospital Stay: Payer: Medicare HMO | Attending: Oncology

## 2022-10-25 VITALS — BP 100/69 | HR 77 | Temp 97.0°F | Resp 18 | Wt 155.3 lb

## 2022-10-25 DIAGNOSIS — Z5112 Encounter for antineoplastic immunotherapy: Secondary | ICD-10-CM | POA: Diagnosis not present

## 2022-10-25 DIAGNOSIS — Z79899 Other long term (current) drug therapy: Secondary | ICD-10-CM

## 2022-10-25 DIAGNOSIS — Z5111 Encounter for antineoplastic chemotherapy: Secondary | ICD-10-CM

## 2022-10-25 DIAGNOSIS — C786 Secondary malignant neoplasm of retroperitoneum and peritoneum: Secondary | ICD-10-CM | POA: Diagnosis not present

## 2022-10-25 DIAGNOSIS — C189 Malignant neoplasm of colon, unspecified: Secondary | ICD-10-CM | POA: Insufficient documentation

## 2022-10-25 DIAGNOSIS — C772 Secondary and unspecified malignant neoplasm of intra-abdominal lymph nodes: Secondary | ICD-10-CM

## 2022-10-25 LAB — CBC WITH DIFFERENTIAL/PLATELET
Abs Immature Granulocytes: 0.02 10*3/uL (ref 0.00–0.07)
Basophils Absolute: 0 10*3/uL (ref 0.0–0.1)
Basophils Relative: 1 %
Eosinophils Absolute: 0.2 10*3/uL (ref 0.0–0.5)
Eosinophils Relative: 4 %
HCT: 37.9 % — ABNORMAL LOW (ref 39.0–52.0)
Hemoglobin: 13.3 g/dL (ref 13.0–17.0)
Immature Granulocytes: 0 %
Lymphocytes Relative: 17 %
Lymphs Abs: 1 10*3/uL (ref 0.7–4.0)
MCH: 34.1 pg — ABNORMAL HIGH (ref 26.0–34.0)
MCHC: 35.1 g/dL (ref 30.0–36.0)
MCV: 97.2 fL (ref 80.0–100.0)
Monocytes Absolute: 0.5 10*3/uL (ref 0.1–1.0)
Monocytes Relative: 9 %
Neutro Abs: 4.1 10*3/uL (ref 1.7–7.7)
Neutrophils Relative %: 69 %
Platelets: 126 10*3/uL — ABNORMAL LOW (ref 150–400)
RBC: 3.9 MIL/uL — ABNORMAL LOW (ref 4.22–5.81)
RDW: 15 % (ref 11.5–15.5)
WBC: 5.9 10*3/uL (ref 4.0–10.5)
nRBC: 0 % (ref 0.0–0.2)

## 2022-10-25 LAB — COMPREHENSIVE METABOLIC PANEL
ALT: 20 U/L (ref 0–44)
AST: 29 U/L (ref 15–41)
Albumin: 3.9 g/dL (ref 3.5–5.0)
Alkaline Phosphatase: 121 U/L (ref 38–126)
Anion gap: 8 (ref 5–15)
BUN: 34 mg/dL — ABNORMAL HIGH (ref 8–23)
CO2: 21 mmol/L — ABNORMAL LOW (ref 22–32)
Calcium: 8.8 mg/dL — ABNORMAL LOW (ref 8.9–10.3)
Chloride: 106 mmol/L (ref 98–111)
Creatinine, Ser: 1.05 mg/dL (ref 0.61–1.24)
GFR, Estimated: >60 mL/min (ref >60)
Glucose, Bld: 99 mg/dL (ref 70–99)
Potassium: 3.9 mmol/L (ref 3.5–5.1)
Sodium: 135 mmol/L (ref 135–145)
Total Bilirubin: 0.8 mg/dL (ref 0.3–1.2)
Total Protein: 7.4 g/dL (ref 6.5–8.1)

## 2022-10-25 LAB — MAGNESIUM: Magnesium: 2 mg/dL (ref 1.7–2.4)

## 2022-10-25 MED ORDER — SODIUM CHLORIDE 0.9 % IV SOLN
6.0000 mg/kg | Freq: Once | INTRAVENOUS | Status: AC
  Administered 2022-10-25: 400 mg via INTRAVENOUS
  Filled 2022-10-25: qty 20

## 2022-10-25 MED ORDER — PALONOSETRON HCL INJECTION 0.25 MG/5ML
0.2500 mg | Freq: Once | INTRAVENOUS | Status: AC
  Administered 2022-10-25: 0.25 mg via INTRAVENOUS
  Filled 2022-10-25: qty 5

## 2022-10-25 MED ORDER — SODIUM CHLORIDE 0.9 % IV SOLN
125.0000 mg/m2 | Freq: Once | INTRAVENOUS | Status: AC
  Administered 2022-10-25: 220 mg via INTRAVENOUS
  Filled 2022-10-25: qty 10

## 2022-10-25 MED ORDER — HEPARIN SOD (PORK) LOCK FLUSH 100 UNIT/ML IV SOLN
500.0000 [IU] | Freq: Once | INTRAVENOUS | Status: AC | PRN
  Administered 2022-10-25: 500 [IU]
  Filled 2022-10-25: qty 5

## 2022-10-25 MED ORDER — HEPARIN SOD (PORK) LOCK FLUSH 100 UNIT/ML IV SOLN
INTRAVENOUS | Status: AC
  Filled 2022-10-25: qty 5

## 2022-10-25 MED ORDER — ATROPINE SULFATE 1 MG/ML IV SOLN
0.5000 mg | Freq: Once | INTRAVENOUS | Status: AC | PRN
  Administered 2022-10-25: 0.5 mg via INTRAVENOUS
  Filled 2022-10-25: qty 1

## 2022-10-25 MED ORDER — SODIUM CHLORIDE 0.9% FLUSH
10.0000 mL | INTRAVENOUS | Status: DC | PRN
  Administered 2022-10-25: 10 mL
  Filled 2022-10-25: qty 10

## 2022-10-25 MED ORDER — SODIUM CHLORIDE 0.9 % IV SOLN
Freq: Once | INTRAVENOUS | Status: AC
  Filled 2022-10-25: qty 250

## 2022-10-25 MED ORDER — SODIUM CHLORIDE 0.9 % IV SOLN
10.0000 mg | Freq: Once | INTRAVENOUS | Status: AC
  Administered 2022-10-25: 10 mg via INTRAVENOUS
  Filled 2022-10-25: qty 10

## 2022-10-25 NOTE — Progress Notes (Signed)
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 He currently smokes 0.5ppd. he presented to the ER with symptoms of left sided chest pain and left arm pain. Troponin was negative ekg was unremarkable. Ct chest showed no PE. He was incidentally noted to have mediastinal and retrocrural adenopathy and a 2.1X2.3X4.4 cm retroaortic soft tissue lesion in the posterior left chest. 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  Low-grade activity in the left lateral fifth and sixth ribs associated with nondisplaced fractures likely reflecting healing response problem malignancy.   Patient underwent CT-guided biopsy of the retroperitoneal lymph node pathology showed metastatic adenocarcinoma compatible with colorectal origin.  CK7 negative.  CK20 positive.  CDX 2+.  TTF-1 negative.  PSA negative.  This pattern of immunoreactivity supports the above diagnosis. Patient underwent colonoscopy which showed sigmoid mass that was consistent with adenocarcinoma.  RAS panel testing showed that he was wild-type for both K-ras and BRAF   FOLFOX and bevacizumab chemotherapy started in July 2020.  Subsequently oxaliplatin has been on hold given neuropathy.   Recent scans have shown slow increase in the size of intra-abdominal adenopathy but patient continues to have low-volume disease.plan is to switch him to second line Xeloda irinotecan panitumumab  regimen.  Patient received palliative radiation therapy to the enlarging external iliac lymph nodes  Interval history-he feels well today.  Symptoms of nasal congestion and dizziness have resolved.  Denies any abdominal pain or other complaints.  ECOG PS- 0 Pain scale- 0   Review of systems- Review of Systems  Constitutional:  Negative for chills, fever, malaise/fatigue and weight loss.  HENT:  Negative for congestion, ear discharge and nosebleeds.   Eyes:  Negative for blurred vision.  Respiratory:  Negative for cough, hemoptysis, sputum production, shortness of breath and wheezing.   Cardiovascular:  Negative for chest pain, palpitations, orthopnea and claudication.  Gastrointestinal:  Negative for abdominal pain, blood in stool, constipation, diarrhea, heartburn, melena, nausea and vomiting.  Genitourinary:  Negative for dysuria, flank pain, frequency, hematuria and urgency.  Musculoskeletal:  Negative for back pain, joint pain and myalgias.  Skin:  Negative for rash.  Neurological:  Negative for dizziness, tingling, focal weakness, seizures, weakness and headaches.  Endo/Heme/Allergies:  Does not bruise/bleed easily.  Psychiatric/Behavioral:  Negative for depression and suicidal ideas. The patient does not have insomnia.       No Known Allergies   Past Medical History:  Diagnosis Date   Colon cancer ([place])    COPD (chronic obstructive pulmonary disease) ([place])    Hyperlipidemia      Past Surgical History:  Procedure Laterality Date   COLONOSCOPY WITH PROPOFOL N/A 06/26/2019   Procedure: COLONOSCOPY WITH PROPOFOL;  Surgeon: [name], MD;  Location[facility] ENDOSCOPY;  Service: Gastroenterology;  Laterality: N/A;   FLEXIBLE SIGMOIDOSCOPY N/A 05/07/2021  Procedure: FLEXIBLE  SIGMOIDOSCOPY;  Surgeon: [name], MD;  Location: [facility] ENDOSCOPY;  Service: Gastroenterology;  Laterality: N/A;   PORTA CATH INSERTION N/A 06/25/2019   Procedure: PORTA CATH INSERTION;  Surgeon: Algernon Huxley, MD;  Location: [place] CV LAB;  Service: Cardiovascular;  Laterality: N/A;   PORTA CATH INSERTION Left 08/11/2020   Procedure: PORTA CATH INSERTION;  Surgeon: Algernon Huxley, MD;  Location: [place] CV LAB;  Service: Cardiovascular;  Laterality: Left;   PORTA CATH REMOVAL Right 08/11/2020   Procedure: PORTA CATH REMOVAL;  Surgeon: Algernon Huxley, MD;  Location: [place] CV LAB;  Service: Cardiovascular;  Laterality: Right;    Social History   Socioeconomic History   Marital status: Single    Spouse name: Not on file   Number of children: Not on file   Years of education: Not on file   Highest education level: Not on file  Occupational History   Not on file  Tobacco Use   Smoking status: Every Day    Packs/day: 0.50    Years: 40.00    Total pack years: 20.00    Types: Cigarettes   Smokeless tobacco: Never   Tobacco comments:    smoking 40 years  Vaping Use   Vaping Use: Never used  Substance and Sexual Activity   Alcohol use: No   Drug use: No   Sexual activity: Not Currently  Other Topics Concern   Not on file  Social History Narrative   Not on file   Social Determinants of Health   Financial Resource Strain: Low Risk  (06/12/2019)   Overall Financial Resource Strain (CARDIA)    Difficulty of Paying Living Expenses: Not hard at all  Food Insecurity: No Food Insecurity (06/12/2019)   Hunger Vital Sign    Worried About Running Out of Food in the Last Year: Never true    Ran Out of Food in the Last Year: Never true  Transportation Needs: No Transportation Needs (06/12/2019)   PRAPARE - Hydrologist (Medical): No    Lack of Transportation (Non-Medical): No  Physical Activity: Not on file  Stress: No Stress Concern Present  (06/12/2019)   [place]    Feeling of Stress : Not at all  Social Connections: Unknown (06/12/2019)   Social Connection and Isolation Panel [NHANES]    Frequency of Communication with Friends and Family: More than three times a week    Frequency of Social Gatherings with Friends and Family: Not on file    Attends Religious Services: Not on file    Active Member of [place] or Organizations: Not on file    Attends Archivist Meetings: Not on file    Marital Status: Not on file  Intimate Partner Violence: Not At Risk (06/12/2019)   Humiliation, Afraid, Rape, and Kick questionnaire    Fear of Current or Ex-Partner: No    Emotionally Abused: No    Physically Abused: No    Sexually Abused: No    Family History  Problem Relation Age of Onset   Brain cancer Father      Current Outpatient Medications:    allopurinol (ZYLOPRIM) 100 MG tablet, Take 100 mg by mouth daily., Disp: , Rfl:    aspirin EC 81 MG tablet, Take 81 mg by mouth daily., Disp: , Rfl:    azelastine (ASTELIN) 0.1 % nasal spray, Place 1 spray into both nostrils daily as needed., Disp: ,  Rfl:    butalbital-acetaminophen-caffeine (FIORICET) 50-325-40 MG tablet, Take 1 tablet by mouth every 4 (four) hours as needed., Disp: , Rfl:    capecitabine (XELODA) 500 MG tablet, Take 1 tablet (500 mg total) by mouth 2 (two) times daily after a meal. Take for 14 days, then hold for 7 days. Repeat every 21 days., Disp: 28 tablet, Rfl: 0   cetirizine (ZYRTEC) 10 MG tablet, Take 1 tablet (10 mg total) by mouth daily., Disp: 30 tablet, Rfl: 0   chlorthalidone (HYGROTON) 25 MG tablet, Take 1 tablet by mouth daily., Disp: , Rfl:    DEXILANT 60 MG capsule, Take 60 mg by mouth daily., Disp: , Rfl:    diphenoxylate-atropine (LOMOTIL) 2.5-0.025 MG tablet, Take 1 tablet by mouth 4 (four) times daily as needed for diarrhea or loose stools., Disp: 30 tablet, Rfl: 0   DULoxetine  (CYMBALTA) 30 MG capsule, Take 1 capsule (30 mg total) by mouth 2 (two) times daily., Disp: 60 capsule, Rfl: 1   fluticasone (FLONASE) 50 MCG/ACT nasal spray, Place 2 sprays into both nostrils daily as needed., Disp: , Rfl:    lidocaine-prilocaine (EMLA) cream, Apply 1 application  topically as needed (pt will put 1 inch of cream over port 1 hourfor each treatment, cover plastic over the cream protect clothes)., Disp: 30 g, Rfl: 3   lisinopril (ZESTRIL) 5 MG tablet, Take 5 mg by mouth daily., Disp: , Rfl:    montelukast (SINGULAIR) 10 MG tablet, Take 10 mg by mouth at bedtime., Disp: , Rfl:    NEXLETOL 180 MG TABS, Take 1 tablet by mouth daily., Disp: , Rfl:    pregabalin (LYRICA) 75 MG capsule, TAKE 1 CAPSULE(75 MG) BY MOUTH TWICE DAILY, Disp: 60 capsule, Rfl: 2   simvastatin (ZOCOR) 20 MG tablet, Take 20 mg by mouth daily. , Disp: , Rfl:    SYMBICORT 80-4.5 MCG/ACT inhaler, Inhale 2 puffs into the lungs daily., Disp: , Rfl:    tamsulosin (FLOMAX) 0.4 MG CAPS capsule, Take 0.4 mg by mouth. , Disp: , Rfl:    cyclobenzaprine (FLEXERIL) 10 MG tablet, TAKE 1 TABLET(10 MG) BY MOUTH THREE TIMES DAILY AS NEEDED FOR MUSCLE SPASMS (Patient not taking: Reported on 10/11/2022), Disp: 42 tablet, Rfl: 0   magic mouthwash (multi-ingredient) oral suspension, Swish and spit 5-10 mls by mouth 4 times a day as needed (Patient not taking: Reported on 09/16/2022), Disp: 480 mL, Rfl: 3   oxyCODONE (OXY IR/ROXICODONE) 5 MG immediate release tablet, Take 1 tablet (5 mg total) by mouth every 6 (six) hours as needed for severe pain. (Patient not taking: Reported on 04/05/2022), Disp: 30 tablet, Rfl: 0 No current facility-administered medications for this visit.  Facility-Administered Medications Ordered in Other Visits:    atropine 1 MG/ML injection, , , ,    heparin lock flush 100 UNIT/ML injection, , , ,    heparin lock flush 100 UNIT/ML injection, , , ,    sodium chloride flush (NS) 0.9 % injection 10 mL, 10 mL,  Intracatheter, PRN, Sindy Guadeloupe, MD, 10 mL at 10/25/22 1220  Physical exam:  Vitals:   10/25/22 0833  BP: 100/69  Pulse: 77  Resp: 18  Temp: (!) 97 F (36.1 C)  SpO2: 100%  Weight: 155 lb 4.8 oz (70.4 kg)   Physical Exam Constitutional:      General: He is not in acute distress. Cardiovascular:     Rate and Rhythm: Normal rate and regular rhythm.     Heart sounds:  Normal heart sounds.  Pulmonary:     Effort: Pulmonary effort is normal.     Breath sounds: Normal breath sounds.  Skin:    General: Skin is warm and dry.  Neurological:     Mental Status: He is alert and oriented to person, place, and time.         Latest Ref Rng & Units 10/25/2022    8:12 AM  CMP  Glucose 70 - 99 mg/dL 99   BUN 8 - 23 mg/dL 34   Creatinine 0.61 - 1.24 mg/dL 1.05   Sodium 135 - 145 mmol/L 135   Potassium 3.5 - 5.1 mmol/L 3.9   Chloride 98 - 111 mmol/L 106   CO2 22 - 32 mmol/L 21   Calcium 8.9 - 10.3 mg/dL 8.8   Total Protein 6.5 - 8.1 g/dL 7.4   Total Bilirubin 0.3 - 1.2 mg/dL 0.8   Alkaline Phos 38 - 126 U/L 121   AST 15 - 41 U/L 29   ALT 0 - 44 U/L 20       Latest Ref Rng & Units 10/25/2022    8:12 AM  CBC  WBC 4.0 - 10.5 K/uL 5.9   Hemoglobin 13.0 - 17.0 g/dL 13.3   Hematocrit 39.0 - 52.0 % 37.9   Platelets 150 - 400 K/uL 126     Assessment and plan- Patient is a 71 y.o. male  with metastatic colon cancer and intra-abdominal metastases stage IV.  He is here for on treatment assessment prior to cycle 17 of panitumumab irinotecan chemotherapy  Counts okay to proceed with cycle 17 of panitumumab irinotecan chemotherapy today.  He is getting Xeloda at home 2 weeks on and 1 week off as well.  I will see him back in 3 weeks for cycle 18.  Recent scans showed treatment response and CEA has been trending down overall.   Visit Diagnosis 1. 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 ([place])   2. Encounter for monoclonal antibody treatment for malignancy   3. High risk  medication use   4. Encounter for antineoplastic chemotherapy      Dr. Randa Evens, MD, MPH [facility] at [facility] [phone] 10/25/2022 3:01 PM

## 2022-10-25 NOTE — Progress Notes (Signed)
Pt has no new concerns for todays visit. 

## 2022-10-25 NOTE — Patient Instructions (Signed)
[facility] CANCER CTR AT [place]  Discharge Instructions: Thank you for choosing [facility] to provide your oncology and hematology care.  If you have a lab appointment with the [place], please go directly to the [place] and check in at the registration area.  Wear comfortable clothing and clothing appropriate for easy access to any Portacath or PICC line.   We strive to give you quality time with your provider. You may need to reschedule your appointment if you arrive late (15 or more minutes).  Arriving late affects you and other patients whose appointments are after yours.  Also, if you miss three or more appointments without notifying the office, you may be dismissed from the clinic at the provider's discretion.      For prescription refill requests, have your pharmacy contact our office and allow 72 hours for refills to be completed.    Today you received the following chemotherapy and/or immunotherapy agents- Vectibix, Irinotecan      To help prevent nausea and vomiting after your treatment, we encourage you to take your nausea medication as directed.  BELOW ARE SYMPTOMS THAT SHOULD BE REPORTED IMMEDIATELY: *FEVER GREATER THAN 100.4 F (38 C) OR HIGHER *CHILLS OR SWEATING *NAUSEA AND VOMITING THAT IS NOT CONTROLLED WITH YOUR NAUSEA MEDICATION *UNUSUAL SHORTNESS OF BREATH *UNUSUAL BRUISING OR BLEEDING *URINARY PROBLEMS (pain or burning when urinating, or frequent urination) *BOWEL PROBLEMS (unusual diarrhea, constipation, pain near the anus) TENDERNESS IN MOUTH AND THROAT WITH OR WITHOUT PRESENCE OF ULCERS (sore throat, sores in mouth, or a toothache) UNUSUAL RASH, SWELLING OR PAIN  UNUSUAL VAGINAL DISCHARGE OR ITCHING   Items with * indicate a potential emergency and should be followed up as soon as possible or go to the Emergency Department if any problems should occur.  Please show the CHEMOTHERAPY ALERT CARD or IMMUNOTHERAPY ALERT CARD at  check-in to the Emergency Department and triage nurse.  Should you have questions after your visit or need to cancel or reschedule your appointment, please contact [facility] CANCER [place] AT [employer]  [phone] and follow the prompts.  Office hours are 8:00 a.m. to 4:30 p.m. Monday - Friday. Please note that voicemails left after 4:00 p.m. may not be returned until the following business day.  We are closed weekends and major holidays. You have access to a nurse at all times for urgent questions. Please call the main number to the clinic [phone] and follow the prompts.  For any non-urgent questions, you may also contact your provider using MyChart. We now offer e-Visits for anyone 4 and older to request care online for non-urgent symptoms. For details visit [email or web address].   Also download the MyChart app! Go to the app store, search "MyChart", open the app, select [place], and log in with your MyChart username and password.  Masks are optional in the cancer centers. If you would like for your care team to wear a mask while they are taking care of you, please let them know. For doctor visits, patients may have with them one support person who is at least 73 years old. At this time, visitors are not allowed in the infusion area.

## 2022-10-26 ENCOUNTER — Other Ambulatory Visit: Payer: Self-pay | Admitting: Nurse Practitioner

## 2022-10-26 ENCOUNTER — Other Ambulatory Visit (HOSPITAL_COMMUNITY): Payer: Self-pay

## 2022-10-26 DIAGNOSIS — C189 Malignant neoplasm of colon, unspecified: Secondary | ICD-10-CM

## 2022-10-26 MED ORDER — CAPECITABINE 500 MG PO TABS
500.0000 mg | ORAL_TABLET | Freq: Two times a day (BID) | ORAL | 0 refills | Status: DC
  Filled 2022-10-26: qty 28, 21d supply, fill #0

## 2022-10-28 ENCOUNTER — Other Ambulatory Visit (HOSPITAL_COMMUNITY): Payer: Self-pay

## 2022-10-28 DIAGNOSIS — E790 Hyperuricemia without signs of inflammatory arthritis and tophaceous disease: Secondary | ICD-10-CM | POA: Diagnosis not present

## 2022-10-28 DIAGNOSIS — E785 Hyperlipidemia, unspecified: Secondary | ICD-10-CM | POA: Diagnosis not present

## 2022-10-29 DIAGNOSIS — F17200 Nicotine dependence, unspecified, uncomplicated: Secondary | ICD-10-CM | POA: Diagnosis not present

## 2022-10-29 DIAGNOSIS — E785 Hyperlipidemia, unspecified: Secondary | ICD-10-CM | POA: Diagnosis not present

## 2022-10-29 DIAGNOSIS — J301 Allergic rhinitis due to pollen: Secondary | ICD-10-CM | POA: Diagnosis not present

## 2022-10-29 DIAGNOSIS — I2510 Atherosclerotic heart disease of native coronary artery without angina pectoris: Secondary | ICD-10-CM | POA: Diagnosis not present

## 2022-10-29 DIAGNOSIS — J019 Acute sinusitis, unspecified: Secondary | ICD-10-CM | POA: Diagnosis not present

## 2022-10-29 DIAGNOSIS — J449 Chronic obstructive pulmonary disease, unspecified: Secondary | ICD-10-CM | POA: Diagnosis not present

## 2022-10-29 DIAGNOSIS — K219 Gastro-esophageal reflux disease without esophagitis: Secondary | ICD-10-CM | POA: Diagnosis not present

## 2022-10-29 DIAGNOSIS — E790 Hyperuricemia without signs of inflammatory arthritis and tophaceous disease: Secondary | ICD-10-CM | POA: Diagnosis not present

## 2022-10-29 DIAGNOSIS — I10 Essential (primary) hypertension: Secondary | ICD-10-CM | POA: Diagnosis not present

## 2022-11-01 ENCOUNTER — Ambulatory Visit: Payer: Medicare HMO

## 2022-11-01 ENCOUNTER — Ambulatory Visit: Payer: Medicare HMO | Admitting: Oncology

## 2022-11-01 ENCOUNTER — Other Ambulatory Visit: Payer: Medicare HMO

## 2022-11-10 ENCOUNTER — Other Ambulatory Visit (HOSPITAL_COMMUNITY): Payer: Self-pay

## 2022-11-15 ENCOUNTER — Other Ambulatory Visit (HOSPITAL_COMMUNITY): Payer: Self-pay

## 2022-11-15 ENCOUNTER — Ambulatory Visit: Payer: Medicare HMO

## 2022-11-15 ENCOUNTER — Other Ambulatory Visit: Payer: Medicare HMO

## 2022-11-15 ENCOUNTER — Other Ambulatory Visit: Payer: Self-pay | Admitting: Nurse Practitioner

## 2022-11-15 ENCOUNTER — Ambulatory Visit: Payer: Medicare HMO | Admitting: Oncology

## 2022-11-15 DIAGNOSIS — C772 Secondary and unspecified malignant neoplasm of intra-abdominal lymph nodes: Secondary | ICD-10-CM

## 2022-11-15 MED FILL — Dexamethasone Sodium Phosphate Inj 100 MG/10ML: INTRAMUSCULAR | Qty: 1 | Status: AC

## 2022-11-16 ENCOUNTER — Inpatient Hospital Stay: Payer: Medicare HMO

## 2022-11-16 ENCOUNTER — Other Ambulatory Visit: Payer: Self-pay

## 2022-11-16 ENCOUNTER — Inpatient Hospital Stay (HOSPITAL_BASED_OUTPATIENT_CLINIC_OR_DEPARTMENT_OTHER): Payer: Medicare HMO | Admitting: Oncology

## 2022-11-16 ENCOUNTER — Inpatient Hospital Stay: Payer: Medicare HMO | Attending: Oncology

## 2022-11-16 ENCOUNTER — Encounter: Payer: Self-pay | Admitting: Oncology

## 2022-11-16 ENCOUNTER — Other Ambulatory Visit (HOSPITAL_COMMUNITY): Payer: Self-pay

## 2022-11-16 ENCOUNTER — Other Ambulatory Visit: Payer: Self-pay | Admitting: *Deleted

## 2022-11-16 VITALS — BP 113/58 | HR 78 | Temp 97.4°F | Resp 16 | Wt 157.5 lb

## 2022-11-16 DIAGNOSIS — G620 Drug-induced polyneuropathy: Secondary | ICD-10-CM | POA: Insufficient documentation

## 2022-11-16 DIAGNOSIS — D696 Thrombocytopenia, unspecified: Secondary | ICD-10-CM | POA: Diagnosis not present

## 2022-11-16 DIAGNOSIS — Z79899 Other long term (current) drug therapy: Secondary | ICD-10-CM

## 2022-11-16 DIAGNOSIS — Z5111 Encounter for antineoplastic chemotherapy: Secondary | ICD-10-CM | POA: Diagnosis not present

## 2022-11-16 DIAGNOSIS — C772 Secondary and unspecified malignant neoplasm of intra-abdominal lymph nodes: Secondary | ICD-10-CM

## 2022-11-16 DIAGNOSIS — C786 Secondary malignant neoplasm of retroperitoneum and peritoneum: Secondary | ICD-10-CM | POA: Diagnosis not present

## 2022-11-16 DIAGNOSIS — F17210 Nicotine dependence, cigarettes, uncomplicated: Secondary | ICD-10-CM | POA: Diagnosis not present

## 2022-11-16 DIAGNOSIS — C189 Malignant neoplasm of colon, unspecified: Secondary | ICD-10-CM

## 2022-11-16 DIAGNOSIS — Z5112 Encounter for antineoplastic immunotherapy: Secondary | ICD-10-CM

## 2022-11-16 LAB — CBC WITH DIFFERENTIAL/PLATELET
Abs Immature Granulocytes: 0.04 10*3/uL (ref 0.00–0.07)
Basophils Absolute: 0 10*3/uL (ref 0.0–0.1)
Basophils Relative: 1 %
Eosinophils Absolute: 0.2 10*3/uL (ref 0.0–0.5)
Eosinophils Relative: 4 %
HCT: 36.9 % — ABNORMAL LOW (ref 39.0–52.0)
Hemoglobin: 13.1 g/dL (ref 13.0–17.0)
Immature Granulocytes: 1 %
Lymphocytes Relative: 18 %
Lymphs Abs: 1 10*3/uL (ref 0.7–4.0)
MCH: 34.2 pg — ABNORMAL HIGH (ref 26.0–34.0)
MCHC: 35.5 g/dL (ref 30.0–36.0)
MCV: 96.3 fL (ref 80.0–100.0)
Monocytes Absolute: 0.6 10*3/uL (ref 0.1–1.0)
Monocytes Relative: 11 %
Neutro Abs: 3.6 10*3/uL (ref 1.7–7.7)
Neutrophils Relative %: 65 %
Platelets: 104 10*3/uL — ABNORMAL LOW (ref 150–400)
RBC: 3.83 MIL/uL — ABNORMAL LOW (ref 4.22–5.81)
RDW: 14.7 % (ref 11.5–15.5)
WBC: 5.5 10*3/uL (ref 4.0–10.5)
nRBC: 0 % (ref 0.0–0.2)

## 2022-11-16 LAB — COMPREHENSIVE METABOLIC PANEL
ALT: 18 U/L (ref 0–44)
AST: 25 U/L (ref 15–41)
Albumin: 3.7 g/dL (ref 3.5–5.0)
Alkaline Phosphatase: 98 U/L (ref 38–126)
Anion gap: 8 (ref 5–15)
BUN: 30 mg/dL — ABNORMAL HIGH (ref 8–23)
CO2: 24 mmol/L (ref 22–32)
Calcium: 8.9 mg/dL (ref 8.9–10.3)
Chloride: 104 mmol/L (ref 98–111)
Creatinine, Ser: 1.16 mg/dL (ref 0.61–1.24)
GFR, Estimated: >60 mL/min (ref >60)
Glucose, Bld: 103 mg/dL — ABNORMAL HIGH (ref 70–99)
Potassium: 3.9 mmol/L (ref 3.5–5.1)
Sodium: 136 mmol/L (ref 135–145)
Total Bilirubin: 0.8 mg/dL (ref 0.3–1.2)
Total Protein: 6.8 g/dL (ref 6.5–8.1)

## 2022-11-16 LAB — MAGNESIUM: Magnesium: 1.7 mg/dL (ref 1.7–2.4)

## 2022-11-16 MED ORDER — SODIUM CHLORIDE 0.9 % IV SOLN
10.0000 mg | Freq: Once | INTRAVENOUS | Status: AC
  Administered 2022-11-16: 10 mg via INTRAVENOUS
  Filled 2022-11-16: qty 10

## 2022-11-16 MED ORDER — LIDOCAINE-PRILOCAINE 2.5-2.5 % EX CREA: 1.0000 | TOPICAL_CREAM | CUTANEOUS | 3 refills | Status: DC | PRN

## 2022-11-16 MED ORDER — ATROPINE SULFATE 1 MG/ML IV SOLN
0.5000 mg | Freq: Once | INTRAVENOUS | Status: AC | PRN
  Administered 2022-11-16: 0.5 mg via INTRAVENOUS
  Filled 2022-11-16: qty 1

## 2022-11-16 MED ORDER — HEPARIN SOD (PORK) LOCK FLUSH 100 UNIT/ML IV SOLN
500.0000 [IU] | Freq: Once | INTRAVENOUS | Status: AC | PRN
  Administered 2022-11-16: 500 [IU]
  Filled 2022-11-16: qty 5

## 2022-11-16 MED ORDER — SODIUM CHLORIDE 0.9 % IV SOLN
125.0000 mg/m2 | Freq: Once | INTRAVENOUS | Status: AC
  Administered 2022-11-16: 220 mg via INTRAVENOUS
  Filled 2022-11-16: qty 10

## 2022-11-16 MED ORDER — SODIUM CHLORIDE 0.9 % IV SOLN
6.0000 mg/kg | Freq: Once | INTRAVENOUS | Status: AC
  Administered 2022-11-16: 400 mg via INTRAVENOUS
  Filled 2022-11-16: qty 20

## 2022-11-16 MED ORDER — SODIUM CHLORIDE 0.9 % IV SOLN
Freq: Once | INTRAVENOUS | Status: AC
  Filled 2022-11-16: qty 250

## 2022-11-16 MED ORDER — PALONOSETRON HCL INJECTION 0.25 MG/5ML
0.2500 mg | Freq: Once | INTRAVENOUS | Status: AC
  Administered 2022-11-16: 0.25 mg via INTRAVENOUS
  Filled 2022-11-16: qty 5

## 2022-11-16 MED ORDER — CAPECITABINE 500 MG PO TABS
500.0000 mg | ORAL_TABLET | Freq: Two times a day (BID) | ORAL | 0 refills | Status: DC
  Filled 2022-11-16: qty 28, 21d supply, fill #0

## 2022-11-16 NOTE — Progress Notes (Signed)
Error, the pt already had a refill

## 2022-11-16 NOTE — Progress Notes (Signed)
Hematology/Oncology Consult note [facility]  Telephone:([phone] [phone]  Patient Care Team: Jodi Marble, MD as PCP - General (Internal Medicine) Clent Jacks, RN as Oncology Nurse Navigator Sindy Guadeloupe, MD as Consulting Physician (Hematology and Oncology)   Name of the patient: Eric Simon  [id number]  Jun 08, 1951   Date of visit: 11/16/22  Diagnosis- metastatic colon cancer with metastases to intra-abdominal and mediastinal lymph nodes    Chief complaint/ Reason for visit-on treatment assessment prior to next cycle of panitumumab irinotecan chemotherapy  Heme/Onc history: Patient is a 71 yr old male with >40 pack year history of smoking. He currently smokes 0.5ppd. he presented to the ER with symptoms of left sided chest pain and left arm pain. Troponin was negative ekg was unremarkable. Ct chest showed no PE. He was incidentally noted to have mediastinal and retrocrural adenopathy and a 2.1X2.3X4.4 cm retroaortic soft tissue lesion in the posterior left chest. 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  Low-grade activity in the left lateral fifth and sixth ribs associated with nondisplaced fractures likely reflecting healing response problem malignancy.   Patient underwent CT-guided biopsy of the retroperitoneal lymph node pathology showed metastatic adenocarcinoma compatible with colorectal origin.  CK7 negative.  CK20 positive.  CDX 2+.  TTF-1 negative.  PSA negative.  This pattern of immunoreactivity supports the above diagnosis. Patient underwent colonoscopy which showed sigmoid mass that was consistent with adenocarcinoma.  RAS panel testing showed that he was wild-type for both K-ras and BRAF   FOLFOX and bevacizumab chemotherapy started in July 2020.  Subsequently oxaliplatin has been on hold given neuropathy.   Recent scans have shown slow increase in the size of intra-abdominal adenopathy but patient continues to have low-volume disease.plan is to switch him to second line Xeloda irinotecan panitumumab  regimen.  Patient received palliative radiation therapy to the enlarging external iliac lymph nodes    Interval history-patient is tolerating treatments well so far.  Denies any nausea vomiting or diarrhea.  Denies any blood in his stool.  Denies any significant skin rash or peripheral neuropathy.  ECOG PS- 0 Pain scale- 0   Review of systems- Review of Systems  Constitutional:  Negative for chills, fever, malaise/fatigue and weight loss.  HENT:  Negative for congestion, ear discharge and nosebleeds.   Eyes:  Negative for blurred vision.  Respiratory:  Negative for cough, hemoptysis, sputum production, shortness of breath and wheezing.   Cardiovascular:  Negative for chest pain, palpitations, orthopnea and claudication.  Gastrointestinal:  Negative for abdominal pain, blood in stool, constipation, diarrhea, heartburn, melena, nausea and vomiting.  Genitourinary:  Negative for dysuria, flank pain, frequency, hematuria and urgency.  Musculoskeletal:  Negative for back pain, joint pain and myalgias.  Skin:  Negative for rash.  Neurological:  Negative for dizziness, tingling, focal weakness, seizures, weakness and headaches.  Endo/Heme/Allergies:  Does not bruise/bleed easily.  Psychiatric/Behavioral:  Negative for depression and suicidal ideas. The patient does not have insomnia.       No Known Allergies   Past Medical History:  Diagnosis Date   Colon cancer ([place])    COPD (chronic obstructive pulmonary disease) ([place])    Hyperlipidemia      Past Surgical History:  Procedure Laterality Date   COLONOSCOPY WITH PROPOFOL N/A 06/26/2019   Procedure: COLONOSCOPY WITH PROPOFOL;  Surgeon: [name], MD;  Location: [facility] ENDOSCOPY;  Service: Gastroenterology;  Laterality: N/A;   FLEXIBLE  SIGMOIDOSCOPY N/A 05/07/2021   Procedure: FLEXIBLE SIGMOIDOSCOPY;  Surgeon: [name], MD;  Location: [facility] ENDOSCOPY;  Service: Gastroenterology;  Laterality: N/A;   PORTA CATH INSERTION N/A 06/25/2019   Procedure: PORTA CATH INSERTION;  Surgeon: Algernon Huxley, MD;  Location: [place] CV LAB;  Service: Cardiovascular;  Laterality: N/A;   PORTA CATH INSERTION Left 08/11/2020   Procedure: PORTA CATH INSERTION;  Surgeon: Algernon Huxley, MD;  Location: [place] CV LAB;  Service: Cardiovascular;  Laterality: Left;   PORTA CATH REMOVAL Right 08/11/2020   Procedure: PORTA CATH REMOVAL;  Surgeon: Algernon Huxley, MD;  Location: [place] CV LAB;  Service: Cardiovascular;  Laterality: Right;    Social History   Socioeconomic History   Marital status: Single    Spouse name: Not on file   Number of children: Not on file   Years of education: Not on file   Highest education level: Not on file  Occupational History   Not on file  Tobacco Use   Smoking status: Every Day    Packs/day: 0.50    Years: 40.00    Total pack years: 20.00    Types: Cigarettes   Smokeless tobacco: Never   Tobacco comments:    smoking 40 years  Vaping Use   Vaping Use: Never used  Substance and Sexual Activity   Alcohol use: No   Drug use: No   Sexual activity: Not Currently  Other Topics Concern   Not on file  Social History Narrative   Not on file   Social Determinants of Health   Financial Resource Strain: Low Risk  (06/12/2019)   Overall Financial Resource Strain (CARDIA)    Difficulty of Paying Living Expenses: Not hard at all  Food Insecurity: No Food Insecurity (06/12/2019)   Hunger Vital Sign    Worried About Running Out of Food in the Last Year: Never true    Ran Out of Food in the Last Year: Never true  Transportation Needs: No Transportation Needs (06/12/2019)   PRAPARE - Hydrologist (Medical): No    Lack of Transportation (Non-Medical): No  Physical Activity:  Not on file  Stress: No Stress Concern Present (06/12/2019)   [place]    Feeling of Stress : Not at all  Social Connections: Unknown (06/12/2019)   Social Connection and Isolation Panel [NHANES]    Frequency of Communication with Friends and Family: More than three times a week    Frequency of Social Gatherings with Friends and Family: Not on file    Attends Religious Services: Not on file    Active Member of [employer] or Organizations: Not on file    Attends Archivist Meetings: Not on file    Marital Status: Not on file  Intimate Partner Violence: Not At Risk (06/12/2019)   Humiliation, Afraid, Rape, and Kick questionnaire    Fear of Current or Ex-Partner: No    Emotionally Abused: No    Physically Abused: No    Sexually Abused: No    Family History  Problem Relation Age of Onset   Brain cancer Father      Current Outpatient Medications:    allopurinol (ZYLOPRIM) 100 MG tablet, Take 100 mg by mouth daily., Disp: , Rfl:    aspirin EC 81 MG tablet, Take 81 mg by mouth daily., Disp: , Rfl:    azelastine (ASTELIN) 0.1 %  nasal spray, Place 1 spray into both nostrils daily as needed., Disp: , Rfl:    butalbital-acetaminophen-caffeine (FIORICET) 50-325-40 MG tablet, Take 1 tablet by mouth every 4 (four) hours as needed., Disp: , Rfl:    capecitabine (XELODA) 500 MG tablet, Take 1 tablet (500 mg total) by mouth 2 (two) times daily after a meal. Take for 14 days, then hold for 7 days. Repeat every 21 days., Disp: 28 tablet, Rfl: 0   cetirizine (ZYRTEC) 10 MG tablet, Take 1 tablet (10 mg total) by mouth daily., Disp: 30 tablet, Rfl: 0   chlorthalidone (HYGROTON) 25 MG tablet, Take 1 tablet by mouth daily., Disp: , Rfl:    DEXILANT 60 MG capsule, Take 60 mg by mouth daily., Disp: , Rfl:    diphenoxylate-atropine (LOMOTIL) 2.5-0.025 MG tablet, Take 1 tablet by mouth 4 (four) times daily as needed for diarrhea or loose  stools., Disp: 30 tablet, Rfl: 0   DULoxetine (CYMBALTA) 30 MG capsule, Take 1 capsule (30 mg total) by mouth 2 (two) times daily., Disp: 60 capsule, Rfl: 1   fluticasone (FLONASE) 50 MCG/ACT nasal spray, Place 2 sprays into both nostrils daily as needed., Disp: , Rfl:    lisinopril (ZESTRIL) 5 MG tablet, Take 5 mg by mouth daily., Disp: , Rfl:    montelukast (SINGULAIR) 10 MG tablet, Take 10 mg by mouth at bedtime., Disp: , Rfl:    NEXLETOL 180 MG TABS, Take 1 tablet by mouth daily., Disp: , Rfl:    pregabalin (LYRICA) 75 MG capsule, TAKE 1 CAPSULE(75 MG) BY MOUTH TWICE DAILY, Disp: 60 capsule, Rfl: 2   simvastatin (ZOCOR) 20 MG tablet, Take 20 mg by mouth daily. , Disp: , Rfl:    SYMBICORT 80-4.5 MCG/ACT inhaler, Inhale 2 puffs into the lungs daily., Disp: , Rfl:    tamsulosin (FLOMAX) 0.4 MG CAPS capsule, Take 0.4 mg by mouth. , Disp: , Rfl:    cyclobenzaprine (FLEXERIL) 10 MG tablet, TAKE 1 TABLET(10 MG) BY MOUTH THREE TIMES DAILY AS NEEDED FOR MUSCLE SPASMS (Patient not taking: Reported on 10/11/2022), Disp: 42 tablet, Rfl: 0   lidocaine-prilocaine (EMLA) cream, Apply 1 Application topically as needed (pt will put 1 inch of cream over port 1 hourfor each treatment, cover plastic over the cream protect clothes)., Disp: 30 g, Rfl: 3   magic mouthwash (multi-ingredient) oral suspension, Swish and spit 5-10 mls by mouth 4 times a day as needed (Patient not taking: Reported on 09/16/2022), Disp: 480 mL, Rfl: 3   oxyCODONE (OXY IR/ROXICODONE) 5 MG immediate release tablet, Take 1 tablet (5 mg total) by mouth every 6 (six) hours as needed for severe pain. (Patient not taking: Reported on 04/05/2022), Disp: 30 tablet, Rfl: 0 No current facility-administered medications for this visit.  Facility-Administered Medications Ordered in Other Visits:    atropine 1 MG/ML injection, , , ,    heparin lock flush 100 UNIT/ML injection, , , ,   Physical exam:  Vitals:   11/16/22 0846  BP: (!) 113/58  Pulse: 78   Resp: 16  Temp: (!) 97.4 F (36.3 C)  SpO2: 100%  Weight: 157 lb 8 oz (71.4 kg)   Physical Exam Cardiovascular:     Rate and Rhythm: Normal rate and regular rhythm.     Heart sounds: Normal heart sounds.  Pulmonary:     Effort: Pulmonary effort is normal.     Breath sounds: Normal breath sounds.  Skin:    General: Skin is warm and dry.  Neurological:  Mental Status: He is alert and oriented to person, place, and time.         Latest Ref Rng & Units 11/16/2022    8:09 AM  CMP  Glucose 70 - 99 mg/dL 103   BUN 8 - 23 mg/dL 30   Creatinine 0.61 - 1.24 mg/dL 1.16   Sodium 135 - 145 mmol/L 136   Potassium 3.5 - 5.1 mmol/L 3.9   Chloride 98 - 111 mmol/L 104   CO2 22 - 32 mmol/L 24   Calcium 8.9 - 10.3 mg/dL 8.9   Total Protein 6.5 - 8.1 g/dL 6.8   Total Bilirubin 0.3 - 1.2 mg/dL 0.8   Alkaline Phos 38 - 126 U/L 98   AST 15 - 41 U/L 25   ALT 0 - 44 U/L 18       Latest Ref Rng & Units 11/16/2022    8:09 AM  CBC  WBC 4.0 - 10.5 K/uL 5.5   Hemoglobin 13.0 - 17.0 g/dL 13.1   Hematocrit 39.0 - 52.0 % 36.9   Platelets 150 - 400 K/uL 104     Assessment and plan- Patient is a 71 y.o. male with metastatic colon cancer with lymph node metastases.  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  He also has mild splenomegaly of 13.4 cm and possible cirrhosis noted on his CT scan.  Which may be contributing to his thrombocytopenia.  Counts are otherwise okay to proceed with cycle 18 of panitumumab irinotecan chemotherapy today.  He has been off with timing his Xeloda and chemotherapy.  This is his week off treatment and he will be restarting Xeloda on 11/22/2022.  I have asked him to stop his Xeloda on 11/30/2022 and he will start a new cycle congruent with his cycle 18 of irinotecan panitumumab treatment on 12/07/2022.  CEA from today is pending.  Overall his CEA has  been trending down and the last level was normal at 4.4.  I will plan to repeat scans sometime in January 2024.   Visit Diagnosis 1. Encounter for antineoplastic chemotherapy   2. 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 ([place])   3. Encounter for monoclonal antibody treatment for malignancy   4. High risk medication use      Dr. Randa Evens, MD, MPH [facility] [phone] 11/16/2022 9:19 AM

## 2022-11-17 LAB — CEA: CEA: 4.4 ng/mL (ref 0.0–4.7)

## 2022-11-18 ENCOUNTER — Other Ambulatory Visit (HOSPITAL_COMMUNITY): Payer: Self-pay

## 2022-11-18 ENCOUNTER — Other Ambulatory Visit: Payer: Self-pay

## 2022-11-22 ENCOUNTER — Other Ambulatory Visit (HOSPITAL_COMMUNITY): Payer: Self-pay

## 2022-11-30 ENCOUNTER — Ambulatory Visit: Payer: Medicare HMO

## 2022-11-30 ENCOUNTER — Ambulatory Visit: Payer: Medicare HMO | Admitting: Oncology

## 2022-11-30 ENCOUNTER — Other Ambulatory Visit: Payer: Medicare HMO

## 2022-12-07 MED FILL — Dexamethasone Sodium Phosphate Inj 100 MG/10ML: INTRAMUSCULAR | Qty: 1 | Status: AC

## 2022-12-08 ENCOUNTER — Encounter: Payer: Self-pay | Admitting: Oncology

## 2022-12-08 ENCOUNTER — Inpatient Hospital Stay: Payer: Medicare HMO

## 2022-12-08 ENCOUNTER — Other Ambulatory Visit: Payer: Self-pay | Admitting: *Deleted

## 2022-12-08 ENCOUNTER — Inpatient Hospital Stay (HOSPITAL_BASED_OUTPATIENT_CLINIC_OR_DEPARTMENT_OTHER): Payer: Medicare HMO | Admitting: Oncology

## 2022-12-08 VITALS — BP 109/66 | HR 71 | Temp 97.7°F | Resp 16 | Ht 67.0 in | Wt 158.0 lb

## 2022-12-08 DIAGNOSIS — C772 Secondary and unspecified malignant neoplasm of intra-abdominal lymph nodes: Secondary | ICD-10-CM

## 2022-12-08 DIAGNOSIS — Z79899 Other long term (current) drug therapy: Secondary | ICD-10-CM

## 2022-12-08 DIAGNOSIS — Z5111 Encounter for antineoplastic chemotherapy: Secondary | ICD-10-CM | POA: Diagnosis not present

## 2022-12-08 DIAGNOSIS — C189 Malignant neoplasm of colon, unspecified: Secondary | ICD-10-CM | POA: Diagnosis not present

## 2022-12-08 DIAGNOSIS — C786 Secondary malignant neoplasm of retroperitoneum and peritoneum: Secondary | ICD-10-CM | POA: Diagnosis not present

## 2022-12-08 DIAGNOSIS — Z5112 Encounter for antineoplastic immunotherapy: Secondary | ICD-10-CM | POA: Diagnosis not present

## 2022-12-08 DIAGNOSIS — T451X5A Adverse effect of antineoplastic and immunosuppressive drugs, initial encounter: Secondary | ICD-10-CM

## 2022-12-08 DIAGNOSIS — G620 Drug-induced polyneuropathy: Secondary | ICD-10-CM | POA: Diagnosis not present

## 2022-12-08 DIAGNOSIS — D696 Thrombocytopenia, unspecified: Secondary | ICD-10-CM | POA: Diagnosis not present

## 2022-12-08 DIAGNOSIS — F17210 Nicotine dependence, cigarettes, uncomplicated: Secondary | ICD-10-CM | POA: Diagnosis not present

## 2022-12-08 LAB — CBC WITH DIFFERENTIAL/PLATELET
Abs Immature Granulocytes: 0.04 10*3/uL (ref 0.00–0.07)
Basophils Absolute: 0 10*3/uL (ref 0.0–0.1)
Basophils Relative: 1 %
Eosinophils Absolute: 0.3 10*3/uL (ref 0.0–0.5)
Eosinophils Relative: 6 %
HCT: 35.9 % — ABNORMAL LOW (ref 39.0–52.0)
Hemoglobin: 12.7 g/dL — ABNORMAL LOW (ref 13.0–17.0)
Immature Granulocytes: 1 %
Lymphocytes Relative: 18 %
Lymphs Abs: 0.9 10*3/uL (ref 0.7–4.0)
MCH: 33.9 pg (ref 26.0–34.0)
MCHC: 35.4 g/dL (ref 30.0–36.0)
MCV: 95.7 fL (ref 80.0–100.0)
Monocytes Absolute: 0.5 10*3/uL (ref 0.1–1.0)
Monocytes Relative: 10 %
Neutro Abs: 3.1 10*3/uL (ref 1.7–7.7)
Neutrophils Relative %: 64 %
Platelets: 113 10*3/uL — ABNORMAL LOW (ref 150–400)
RBC: 3.75 MIL/uL — ABNORMAL LOW (ref 4.22–5.81)
RDW: 14.4 % (ref 11.5–15.5)
WBC: 4.8 10*3/uL (ref 4.0–10.5)
nRBC: 0 % (ref 0.0–0.2)

## 2022-12-08 LAB — MAGNESIUM: Magnesium: 1.7 mg/dL (ref 1.7–2.4)

## 2022-12-08 LAB — COMPREHENSIVE METABOLIC PANEL
ALT: 17 U/L (ref 0–44)
AST: 26 U/L (ref 15–41)
Albumin: 3.8 g/dL (ref 3.5–5.0)
Alkaline Phosphatase: 105 U/L (ref 38–126)
Anion gap: 6 (ref 5–15)
BUN: 34 mg/dL — ABNORMAL HIGH (ref 8–23)
CO2: 24 mmol/L (ref 22–32)
Calcium: 8.6 mg/dL — ABNORMAL LOW (ref 8.9–10.3)
Chloride: 107 mmol/L (ref 98–111)
Creatinine, Ser: 1.01 mg/dL (ref 0.61–1.24)
GFR, Estimated: >60 mL/min (ref >60)
Glucose, Bld: 96 mg/dL (ref 70–99)
Potassium: 4.2 mmol/L (ref 3.5–5.1)
Sodium: 137 mmol/L (ref 135–145)
Total Bilirubin: 0.8 mg/dL (ref 0.3–1.2)
Total Protein: 7.1 g/dL (ref 6.5–8.1)

## 2022-12-08 MED ORDER — PALONOSETRON HCL INJECTION 0.25 MG/5ML
0.2500 mg | Freq: Once | INTRAVENOUS | Status: AC
  Administered 2022-12-08: 0.25 mg via INTRAVENOUS
  Filled 2022-12-08: qty 5

## 2022-12-08 MED ORDER — SODIUM CHLORIDE 0.9 % IV SOLN
6.0000 mg/kg | Freq: Once | INTRAVENOUS | Status: AC
  Administered 2022-12-08: 400 mg via INTRAVENOUS
  Filled 2022-12-08: qty 20

## 2022-12-08 MED ORDER — SODIUM CHLORIDE 0.9 % IV SOLN
Freq: Once | INTRAVENOUS | Status: AC
  Filled 2022-12-08: qty 250

## 2022-12-08 MED ORDER — SODIUM CHLORIDE 0.9 % IV SOLN
125.0000 mg/m2 | Freq: Once | INTRAVENOUS | Status: AC
  Administered 2022-12-08: 220 mg via INTRAVENOUS
  Filled 2022-12-08: qty 10

## 2022-12-08 MED ORDER — PREGABALIN 75 MG PO CAPS: ORAL_CAPSULE | ORAL | 2 refills | Status: DC

## 2022-12-08 MED ORDER — ATROPINE SULFATE 1 MG/ML IV SOLN
0.5000 mg | Freq: Once | INTRAVENOUS | Status: AC | PRN
  Administered 2022-12-08: 0.5 mg via INTRAVENOUS
  Filled 2022-12-08: qty 1

## 2022-12-08 MED ORDER — HEPARIN SOD (PORK) LOCK FLUSH 100 UNIT/ML IV SOLN
500.0000 [IU] | Freq: Once | INTRAVENOUS | Status: AC | PRN
  Administered 2022-12-08: 500 [IU]
  Filled 2022-12-08: qty 5

## 2022-12-08 MED ORDER — SODIUM CHLORIDE 0.9 % IV SOLN
10.0000 mg | Freq: Once | INTRAVENOUS | Status: AC
  Administered 2022-12-08: 10 mg via INTRAVENOUS
  Filled 2022-12-08: qty 10

## 2022-12-08 NOTE — Patient Instructions (Signed)
[facility]  Discharge Instructions: Thank you for choosing [facility] to provide your oncology and hematology care.  If you have a lab appointment with the Cancer Center, please go directly to the Cancer Center and check in at the registration area.  Wear comfortable clothing and clothing appropriate for easy access to any Portacath or PICC line.   We strive to give you quality time with your provider. You may need to reschedule your appointment if you arrive late (15 or more minutes).  Arriving late affects you and other patients whose appointments are after yours.  Also, if you miss three or more appointments without notifying the office, you may be dismissed from the clinic at the provider's discretion.      For prescription refill requests, have your pharmacy contact our office and allow 72 hours for refills to be completed.    To help prevent nausea and vomiting after your treatment, we encourage you to take your nausea medication as directed.  BELOW ARE SYMPTOMS THAT SHOULD BE REPORTED IMMEDIATELY: *FEVER GREATER THAN 100.4 F (38 C) OR HIGHER *CHILLS OR SWEATING *NAUSEA AND VOMITING THAT IS NOT CONTROLLED WITH YOUR NAUSEA MEDICATION *UNUSUAL SHORTNESS OF BREATH *UNUSUAL BRUISING OR BLEEDING *URINARY PROBLEMS (pain or burning when urinating, or frequent urination) *BOWEL PROBLEMS (unusual diarrhea, constipation, pain near the anus) TENDERNESS IN MOUTH AND THROAT WITH OR WITHOUT PRESENCE OF ULCERS (sore throat, sores in mouth, or a toothache) UNUSUAL RASH, SWELLING OR PAIN  UNUSUAL VAGINAL DISCHARGE OR ITCHING   Items with * indicate a potential emergency and should be followed up as soon as possible or go to the Emergency Department if any problems should occur.  Please show the CHEMOTHERAPY ALERT CARD or IMMUNOTHERAPY ALERT CARD at check-in to the Emergency Department and triage nurse.  Should you have questions after your visit or  need to cancel or reschedule your appointment, please contact [facility]  [phone] and follow the prompts.  Office hours are 8:00 a.m. to 4:30 p.m. Monday - Friday. Please note that voicemails left after 4:00 p.m. may not be returned until the following business day.  We are closed weekends and major holidays. You have access to a nurse at all times for urgent questions. Please call the main number to the clinic [phone] and follow the prompts.  For any non-urgent questions, you may also contact your provider using MyChart. We now offer e-Visits for anyone 18 and older to request care online for non-urgent symptoms. For details visit [email or web address].   Also download the MyChart app! Go to the app store, search "MyChart", open the app, select , and log in with your MyChart username and password.    

## 2022-12-08 NOTE — Progress Notes (Signed)
Hematology/Oncology Consult note [facility]  Telephone:([phone] [phone]  Patient Care Team: Jodi Marble, MD as PCP - General (Internal Medicine) Clent Jacks, RN as Oncology Nurse Navigator Sindy Guadeloupe, MD as Consulting Physician (Hematology and Oncology)   Name of the patient: Eric Simon  [id number]  1951/07/17   Date of visit: 12/08/22  Diagnosis- metastatic colon cancer with metastases to intra-abdominal and mediastinal lymph nodes     Chief complaint/ Reason for visit-on treatment assessment prior to next cycle of panitumumab irinotecan chemotherapy  Heme/Onc history: Patient is a 71 yr old male with >40 pack year history of smoking. He currently smokes 0.5ppd. he presented to the ER with symptoms of left sided chest pain and left arm pain. Troponin was negative ekg was unremarkable. Ct chest showed no PE. He was incidentally noted to have mediastinal and retrocrural adenopathy and a 2.1X2.3X4.4 cm retroaortic soft tissue lesion in the posterior left chest. 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  Low-grade activity in the left lateral fifth and sixth ribs associated with nondisplaced fractures likely reflecting healing response problem malignancy.   Patient underwent CT-guided biopsy of the retroperitoneal lymph node pathology showed metastatic adenocarcinoma compatible with colorectal origin.  CK7 negative.  CK20 positive.  CDX 2+.  TTF-1 negative.  PSA negative.  This pattern of immunoreactivity supports the above diagnosis. Patient underwent colonoscopy which showed sigmoid mass that was consistent with adenocarcinoma.  RAS panel testing showed that he was wild-type for both K-ras and BRAF   FOLFOX and bevacizumab chemotherapy started in July 2020.  Subsequently oxaliplatin has been on hold given neuropathy.   Recent scans have shown slow increase in the size of intra-abdominal adenopathy but patient continues to have low-volume disease.plan is to switch him to second line Xeloda irinotecan panitumumab  regimen.  Patient received palliative radiation therapy to the enlarging external iliac lymph nodes  Interval history-he reports some burning pain in his extremities.  He is currently on Lyrica 75 mg twice daily and feels that it is not helping.  ECOG PS- 1 Pain scale- 0  Review of systems- Review of Systems  Constitutional:  Negative for chills, fever, malaise/fatigue and weight loss.  HENT:  Negative for congestion, ear discharge and nosebleeds.   Eyes:  Negative for blurred vision.  Respiratory:  Negative for cough, hemoptysis, sputum production, shortness of breath and wheezing.   Cardiovascular:  Negative for chest pain, palpitations, orthopnea and claudication.  Gastrointestinal:  Negative for abdominal pain, blood in stool, constipation, diarrhea, heartburn, melena, nausea and vomiting.  Genitourinary:  Negative for dysuria, flank pain, frequency, hematuria and urgency.  Musculoskeletal:  Negative for back pain, joint pain and myalgias.  Skin:  Negative for rash.  Neurological:  Positive for sensory change (Peripheral neuropathy). Negative for dizziness, tingling, focal weakness, seizures, weakness and headaches.  Endo/Heme/Allergies:  Does not bruise/bleed easily.  Psychiatric/Behavioral:  Negative for depression and suicidal ideas. The patient does not have insomnia.       No Known Allergies   Past Medical History:  Diagnosis Date   Colon cancer ([place])    COPD (chronic obstructive pulmonary disease) ([place])    Hyperlipidemia      Past Surgical History:  Procedure Laterality Date   COLONOSCOPY WITH PROPOFOL N/A 06/26/2019   Procedure: COLONOSCOPY WITH PROPOFOL;  Surgeon: [name], MD;  Location: [facility] ENDOSCOPY;  Service:  Gastroenterology;  Laterality: N/A;   FLEXIBLE  SIGMOIDOSCOPY N/A 05/07/2021   Procedure: FLEXIBLE SIGMOIDOSCOPY;  Surgeon: [name], MD;  Location: [facility] ENDOSCOPY;  Service: Gastroenterology;  Laterality: N/A;   PORTA CATH INSERTION N/A 06/25/2019   Procedure: PORTA CATH INSERTION;  Surgeon: Algernon Huxley, MD;  Location: [place] CV LAB;  Service: Cardiovascular;  Laterality: N/A;   PORTA CATH INSERTION Left 08/11/2020   Procedure: PORTA CATH INSERTION;  Surgeon: Algernon Huxley, MD;  Location: [place] CV LAB;  Service: Cardiovascular;  Laterality: Left;   PORTA CATH REMOVAL Right 08/11/2020   Procedure: PORTA CATH REMOVAL;  Surgeon: Algernon Huxley, MD;  Location: [place] CV LAB;  Service: Cardiovascular;  Laterality: Right;    Social History   Socioeconomic History   Marital status: Single    Spouse name: Not on file   Number of children: Not on file   Years of education: Not on file   Highest education level: Not on file  Occupational History   Not on file  Tobacco Use   Smoking status: Every Day    Packs/day: 0.50    Years: 40.00    Total pack years: 20.00    Types: Cigarettes   Smokeless tobacco: Never   Tobacco comments:    smoking 40 years  Vaping Use   Vaping Use: Never used  Substance and Sexual Activity   Alcohol use: No   Drug use: No   Sexual activity: Not Currently  Other Topics Concern   Not on file  Social History Narrative   Not on file   Social Determinants of Health   Financial Resource Strain: Low Risk  (06/12/2019)   Overall Financial Resource Strain (CARDIA)    Difficulty of Paying Living Expenses: Not hard at all  Food Insecurity: No Food Insecurity (06/12/2019)   Hunger Vital Sign    Worried About Running Out of Food in the Last Year: Never true    Ran Out of Food in the Last Year: Never true  Transportation Needs: No Transportation Needs (06/12/2019)   PRAPARE - Hydrologist (Medical): No    Lack of Transportation (Non-Medical): No  Physical Activity:  Not on file  Stress: No Stress Concern Present (06/12/2019)   [place]    Feeling of Stress : Not at all  Social Connections: Unknown (06/12/2019)   Social Connection and Isolation Panel [NHANES]    Frequency of Communication with Friends and Family: More than three times a week    Frequency of Social Gatherings with Friends and Family: Not on file    Attends Religious Services: Not on file    Active Member of [employer] [place] or Organizations: Not on file    Attends Archivist Meetings: Not on file    Marital Status: Not on file  Intimate Partner Violence: Not At Risk (06/12/2019)   Humiliation, Afraid, Rape, and Kick questionnaire    Fear of Current or Ex-Partner: No    Emotionally Abused: No    Physically Abused: No    Sexually Abused: No    Family History  Problem Relation Age of Onset   Brain cancer Father      Current Outpatient Medications:    aspirin EC 81 MG tablet, Take 81 mg by mouth daily., Disp: , Rfl:    azelastine (ASTELIN) 0.1 % nasal spray, Place 1 spray into both nostrils daily as needed., Disp: , Rfl:  butalbital-acetaminophen-caffeine (FIORICET) 50-325-40 MG tablet, Take 1 tablet by mouth every 4 (four) hours as needed., Disp: , Rfl:    capecitabine (XELODA) 500 MG tablet, Take 1 tablet (500 mg total) by mouth 2 (two) times daily after a meal. Take for 14 days, then hold for 7 days. Repeat every 21 days., Disp: 28 tablet, Rfl: 0   cetirizine (ZYRTEC) 10 MG tablet, Take 1 tablet (10 mg total) by mouth daily., Disp: 30 tablet, Rfl: 0   chlorthalidone (HYGROTON) 25 MG tablet, Take 1 tablet by mouth daily., Disp: , Rfl:    cyclobenzaprine (FLEXERIL) 10 MG tablet, TAKE 1 TABLET(10 MG) BY MOUTH THREE TIMES DAILY AS NEEDED FOR MUSCLE SPASMS, Disp: 42 tablet, Rfl: 0   DEXILANT 60 MG capsule, Take 60 mg by mouth daily., Disp: , Rfl:    diphenoxylate-atropine (LOMOTIL) 2.5-0.025 MG tablet, Take 1 tablet by  mouth 4 (four) times daily as needed for diarrhea or loose stools., Disp: 30 tablet, Rfl: 0   DULoxetine (CYMBALTA) 30 MG capsule, Take 1 capsule (30 mg total) by mouth 2 (two) times daily., Disp: 60 capsule, Rfl: 1   fluticasone (FLONASE) 50 MCG/ACT nasal spray, Place 2 sprays into both nostrils daily as needed., Disp: , Rfl:    lidocaine-prilocaine (EMLA) cream, Apply 1 Application topically as needed (pt will put 1 inch of cream over port 1 hourfor each treatment, cover plastic over the cream protect clothes)., Disp: 30 g, Rfl: 3   lisinopril (ZESTRIL) 5 MG tablet, Take 5 mg by mouth daily., Disp: , Rfl:    magic mouthwash (multi-ingredient) oral suspension, Swish and spit 5-10 mls by mouth 4 times a day as needed (Patient not taking: Reported on 09/16/2022), Disp: 480 mL, Rfl: 3   montelukast (SINGULAIR) 10 MG tablet, Take 10 mg by mouth at bedtime., Disp: , Rfl:    NEXLETOL 180 MG TABS, Take 1 tablet by mouth daily., Disp: , Rfl:    oxyCODONE (OXY IR/ROXICODONE) 5 MG immediate release tablet, Take 1 tablet (5 mg total) by mouth every 6 (six) hours as needed for severe pain. (Patient not taking: Reported on 04/05/2022), Disp: 30 tablet, Rfl: 0   pregabalin (LYRICA) 75 MG capsule, Take lyrica 1 tablet in and and then 2 tablets at night, Disp: 90 capsule, Rfl: 2   simvastatin (ZOCOR) 20 MG tablet, Take 20 mg by mouth daily. , Disp: , Rfl:    SYMBICORT 80-4.5 MCG/ACT inhaler, Inhale 2 puffs into the lungs daily., Disp: , Rfl:    tamsulosin (FLOMAX) 0.4 MG CAPS capsule, Take 0.4 mg by mouth. , Disp: , Rfl:  No current facility-administered medications for this visit.  Facility-Administered Medications Ordered in Other Visits:    atropine 1 MG/ML injection, , , ,    heparin lock flush 100 UNIT/ML injection, , , ,   Physical exam:  Vitals:   12/08/22 0935  BP: 109/66  Pulse: 71  Resp: 16  Temp: 97.7 F (36.5 C)  TempSrc: Oral  Weight: 158 lb (71.7 kg)  Height: _0  (1.702 m)   Physical  Exam Cardiovascular:     Rate and Rhythm: Normal rate and regular rhythm.     Heart sounds: Normal heart sounds.  Pulmonary:     Effort: Pulmonary effort is normal.     Breath sounds: Normal breath sounds.  Abdominal:     General: Bowel sounds are normal.     Palpations: Abdomen is soft.  Skin:    General: Skin is warm and dry.  Neurological:     Mental Status: He is alert and oriented to person, place, and time.         Latest Ref Rng & Units 12/08/2022    8:13 AM  CMP  Glucose 70 - 99 mg/dL 96   BUN 8 - 23 mg/dL 34   Creatinine 0.61 - 1.24 mg/dL 1.01   Sodium 135 - 145 mmol/L 137   Potassium 3.5 - 5.1 mmol/L 4.2   Chloride 98 - 111 mmol/L 107   CO2 22 - 32 mmol/L 24   Calcium 8.9 - 10.3 mg/dL 8.6   Total Protein 6.5 - 8.1 g/dL 7.1   Total Bilirubin 0.3 - 1.2 mg/dL 0.8   Alkaline Phos 38 - 126 U/L 105   AST 15 - 41 U/L 26   ALT 0 - 44 U/L 17       Latest Ref Rng & Units 12/08/2022    8:13 AM  CBC  WBC 4.0 - 10.5 K/uL 4.8   Hemoglobin 13.0 - 17.0 g/dL 12.7   Hematocrit 39.0 - 52.0 % 35.9   Platelets 150 - 400 K/uL 113     Assessment and plan- Patient is a 71 y.o. male with metastatic colon cancer with lymph node metastases.   He is here for on treatment assessment prior to cycle 19 of panitumumab irinotecan chemotherapy  Counts okay to proceed with cycle 19 of panitumumab irinotecan chemotherapy today.  He will start taking his Xeloda 2 weeks on and 1 week off starting today.  Patient wants to go back on Mondays for his chemotherapy and therefore his next cycleWill be on 12/27/2022.  CEA remains normal.  Plan to repeat scans again in February 2024.  Thrombocytopenia: Unclear if secondary to Xeloda versus cirrhosis noted on CT scan.  I will have him see GI in the future again to consider surveillance endoscopy to rule out esophageal varices.  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  We will increase his nighttime dose of Lyrica to 150 mg.    Visit Diagnosis 1. 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 ([place])   2. Encounter for antineoplastic chemotherapy   3. Encounter for monoclonal antibody treatment for malignancy   4. High risk medication use   5. Chemotherapy-induced peripheral neuropathy ([place])      Dr. Randa Evens, MD, MPH [facility] [phone] 12/08/2022 6:36 PM

## 2022-12-22 ENCOUNTER — Other Ambulatory Visit: Payer: Self-pay

## 2022-12-22 ENCOUNTER — Other Ambulatory Visit: Payer: Self-pay | Admitting: Oncology

## 2022-12-22 ENCOUNTER — Other Ambulatory Visit (HOSPITAL_COMMUNITY): Payer: Self-pay

## 2022-12-22 DIAGNOSIS — C189 Malignant neoplasm of colon, unspecified: Secondary | ICD-10-CM

## 2022-12-22 MED ORDER — CAPECITABINE 500 MG PO TABS
500.0000 mg | ORAL_TABLET | Freq: Two times a day (BID) | ORAL | 0 refills | Status: DC
  Filled 2022-12-22: qty 28, 21d supply, fill #0

## 2022-12-22 NOTE — Telephone Encounter (Signed)
CBC with Differential Order: [id number] Status: Final result     Visible to patient: Yes (seen)     Next appt: 12/27/2022 at 08:45 AM in Oncology ([facility])     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   0 Result Notes           Component Ref Range & Units 2 wk ago (12/08/22) 1 mo ago (11/16/22) 1 mo ago (10/25/22) 2 mo ago (10/11/22) 3 mo ago (09/20/22) 3 mo ago (08/30/22) 4 mo ago (08/18/22)  WBC 4.0 - 10.5 K/uL 4.8 5.5 5.9 4.3 4.6 5.1 5.4  RBC 4.22 - 5.81 MIL/uL 3.75 Low  3.83 Low  3.90 Low  3.77 Low  3.98 Low  3.97 Low  3.94 Low   Hemoglobin 13.0 - 17.0 g/dL 12.7 Low  13.1 13.3 12.7 Low  13.4 13.0 13.0  HCT 39.0 - 52.0 % 35.9 Low  36.9 Low  37.9 Low  36.3 Low  37.6 Low  37.6 Low  37.0 Low   MCV 80.0 - 100.0 fL 95.7 96.3 97.2 96.3 94.5 94.7 93.9  MCH 26.0 - 34.0 pg 33.9 34.2 High  34.1 High  33.7 33.7 32.7 33.0  MCHC 30.0 - 36.0 g/dL 35.4 35.5 35.1 35.0 35.6 34.6 35.1  RDW 11.5 - 15.5 % 14.4 14.7 15.0 15.7 High  16.3 High  16.5 High  15.0  Platelets 150 - 400 K/uL 113 Low  104 Low  126 Low  111 Low  CM 113 Low  115 Low  CM 95 Low   Comment: SPECIMEN CHECKED FOR CLOTS  nRBC 0.0 - 0.2 % 0.0 0.0 0.0 0.0 0.0 0.0 0.0 CM  Neutrophils Relative % % 64 65 69 58 67 64   Neutro Abs 1.7 - 7.7 K/uL 3.1 3.6 4.1 2.5 3.1 3.3   Lymphocytes Relative % '18 18 17 21 18 18   '$ Lymphs Abs 0.7 - 4.0 K/uL 0.9 1.0 1.0 0.9 0.8 0.9   Monocytes Relative % '10 11 9 14 11 9   '$ Monocytes Absolute 0.1 - 1.0 K/uL 0.5 0.6 0.5 0.6 0.5 0.5   Eosinophils Relative % '6 4 4 5 2 7   '$ Eosinophils Absolute 0.0 - 0.5 K/uL 0.3 0.2 0.2 0.2 0.1 0.3   Basophils Relative % '1 1 1 1 1 1   '$ Basophils Absolute 0.0 - 0.1 K/uL 0.0 0.0 0.0 0.1 0.0 0.1   Immature Granulocytes % 1 1 0 '1 1 1   '$ Abs Immature Granulocytes 0.00 - 0.07 K/uL 0.04 0.04 CM 0.02 CM 0.03 CM 0.04 CM 0.04 CM   Comment: Performed at [facility], [address] [place], [place] [address]  Resulting Agency  [facility] [place] [facility] [place] [facility] [place] [facility]  LAB [place] [facility] [place] [facility] [place] [facility]         Specimen Collected: 12/08/22 08:13 Last Resulted: 12/08/22 08:48      Lab Flowsheet      Order Details      View Encounter      Lab and Collection Details      Routing      Result History    View All Conversations on this Encounter      CM=Additional comments      Result Care Coordination   Patient Communication   Add Comments   Seen Back to Top      Other Results from 12/08/2022  Magnesium Order: [id number] Status: Final result      Visible  to patient: Yes (seen)      Next appt: 12/27/2022 at 08:45 AM in Oncology ([facility])      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    0 Result Notes             Component Ref Range & Units 2 wk ago (12/08/22) 1 mo ago (11/16/22) 1 mo ago (10/25/22) 2 mo ago (10/11/22) 3 mo ago (09/20/22) 3 mo ago (08/30/22) 4 mo ago (08/11/22)  Magnesium 1.7 - 2.4 mg/dL 1.7 1.7 CM 2.0 CM 2.0 CM 1.8 CM 1.9 CM 2.3 CM  Comment: Performed at [facility], [address] [place], [place] [address]  Resulting Agency  [place] [facility] [place] [facility] [place] [facility] [place] [facility] [place] [facility] [place] [facility] [place] [facility]         Specimen Collected: 12/08/22 08:13 Last Resulted: 12/08/22 09:09      Lab Flowsheet       Order Details       View Encounter       Lab and Collection Details       Routing       Result History     View All Conversations on this Encounter      CM=Additional comments      Result Care Coordination   Patient Communication   Add Comments   Seen Back to Top         Contains abnormal data Comprehensive metabolic panel Order: [id number] Status: Final result      Visible to patient: Yes (seen)      Next appt: 12/27/2022 at 08:45 AM in Oncology ([facility])      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    0 Result Notes             Component Ref Range & Units 2 wk ago (12/08/22) 1 mo ago (11/16/22) 1 mo ago (10/25/22) 2 mo  ago (10/11/22) 3 mo ago (09/20/22) 3 mo ago (08/30/22) 4 mo ago (08/11/22)  Sodium 135 - 145 mmol/L 137 136 135 133 Low  133 Low  137 137  Potassium 3.5 - 5.1 mmol/L 4.2 3.9 3.9 3.9 3.8 3.2 Low  3.8  Chloride 98 - 111 mmol/L 107 104 106 107 105 107 106  CO2 22 - 32 mmol/L '24 24 21 '$ Low  '22 22 23 24  '$ Glucose, Bld 70 - 99 mg/dL 96 103 High  CM 99 CM 100 High  CM 106 High  CM 152 High  CM 98 CM  Comment: Glucose reference range applies only to samples taken after fasting for at least 8 hours.  BUN 8 - 23 mg/dL 34 High  30 High  34 High  29 High  25 High  23 39 High   Creatinine, Ser 0.61 - 1.24 mg/dL 1.01 1.16 1.05 1.02 0.95 0.92 1.10  Calcium 8.9 - 10.3 mg/dL 8.6 Low  8.9 8.8 Low  8.5 Low  9.0 8.7 Low  8.9  Total Protein 6.5 - 8.1 g/dL 7.1 6.8 7.4 7.1 7.3 6.9 7.6  Albumin 3.5 - 5.0 g/dL 3.8 3.7 3.9 3.7 3.9 3.7 3.9  AST 15 - 41 U/L '26 25 29 30 29 '$ 32 37  ALT 0 - 44 U/L '17 18 20 18 23 21 25  '$ Alkaline Phosphatase 38 - 126 U/L 105 98 121 122 120 107 122  Total Bilirubin 0.3 - 1.2 mg/dL 0.8 0.8 0.8 0.7 0.9 0.6 0.7  GFR, Estimated >60 mL/min >  60 >60 CM >60 CM >60 CM >60 CM >60 CM >60 CM  Comment: (NOTE) Calculated using the CKD-EPI Creatinine Equation (2021)  Anion gap 5 - '15 6 8 '$ CM 8 CM 4 Low  CM 6 CM 7 CM 7 CM  Comment: Performed at [facility], [address] [place], [place] [address]  Resulting Agency  [facility] [place] [facility] [place] [facility] [place] [facility] [place] [facility] [place] [facility] [place] [facility]         Specimen Collected: 12/08/22 08:13 Last Resulted: 12/08/22 09:09

## 2022-12-24 MED FILL — Dexamethasone Sodium Phosphate Inj 100 MG/10ML: INTRAMUSCULAR | Qty: 1 | Status: AC

## 2022-12-27 ENCOUNTER — Inpatient Hospital Stay: Payer: Medicare HMO

## 2022-12-27 ENCOUNTER — Inpatient Hospital Stay (HOSPITAL_BASED_OUTPATIENT_CLINIC_OR_DEPARTMENT_OTHER): Payer: Medicare HMO | Admitting: Medical Oncology

## 2022-12-27 ENCOUNTER — Encounter: Payer: Self-pay | Admitting: Medical Oncology

## 2022-12-27 ENCOUNTER — Inpatient Hospital Stay: Payer: Medicare HMO | Attending: Oncology

## 2022-12-27 VITALS — BP 126/76 | HR 78 | Temp 96.9°F | Resp 16 | Ht 67.0 in | Wt 161.4 lb

## 2022-12-27 DIAGNOSIS — Z5112 Encounter for antineoplastic immunotherapy: Secondary | ICD-10-CM | POA: Diagnosis not present

## 2022-12-27 DIAGNOSIS — D6481 Anemia due to antineoplastic chemotherapy: Secondary | ICD-10-CM | POA: Diagnosis not present

## 2022-12-27 DIAGNOSIS — Z5111 Encounter for antineoplastic chemotherapy: Secondary | ICD-10-CM | POA: Diagnosis not present

## 2022-12-27 DIAGNOSIS — C189 Malignant neoplasm of colon, unspecified: Secondary | ICD-10-CM

## 2022-12-27 DIAGNOSIS — D6959 Other secondary thrombocytopenia: Secondary | ICD-10-CM | POA: Insufficient documentation

## 2022-12-27 DIAGNOSIS — C772 Secondary and unspecified malignant neoplasm of intra-abdominal lymph nodes: Secondary | ICD-10-CM | POA: Diagnosis not present

## 2022-12-27 DIAGNOSIS — T451X5A Adverse effect of antineoplastic and immunosuppressive drugs, initial encounter: Secondary | ICD-10-CM

## 2022-12-27 DIAGNOSIS — G620 Drug-induced polyneuropathy: Secondary | ICD-10-CM

## 2022-12-27 DIAGNOSIS — Z79899 Other long term (current) drug therapy: Secondary | ICD-10-CM | POA: Insufficient documentation

## 2022-12-27 LAB — COMPREHENSIVE METABOLIC PANEL
ALT: 21 U/L (ref 0–44)
AST: 33 U/L (ref 15–41)
Albumin: 3.6 g/dL (ref 3.5–5.0)
Alkaline Phosphatase: 121 U/L (ref 38–126)
Anion gap: 11 (ref 5–15)
BUN: 19 mg/dL (ref 8–23)
CO2: 23 mmol/L (ref 22–32)
Calcium: 8.5 mg/dL — ABNORMAL LOW (ref 8.9–10.3)
Chloride: 104 mmol/L (ref 98–111)
Creatinine, Ser: 0.93 mg/dL (ref 0.61–1.24)
GFR, Estimated: >60 mL/min (ref >60)
Glucose, Bld: 84 mg/dL (ref 70–99)
Potassium: 3.7 mmol/L (ref 3.5–5.1)
Sodium: 138 mmol/L (ref 135–145)
Total Bilirubin: 0.4 mg/dL (ref 0.3–1.2)
Total Protein: 6.6 g/dL (ref 6.5–8.1)

## 2022-12-27 LAB — CBC WITH DIFFERENTIAL/PLATELET
Abs Immature Granulocytes: 0.02 10*3/uL (ref 0.00–0.07)
Basophils Absolute: 0 10*3/uL (ref 0.0–0.1)
Basophils Relative: 1 %
Eosinophils Absolute: 0.2 10*3/uL (ref 0.0–0.5)
Eosinophils Relative: 5 %
HCT: 37.4 % — ABNORMAL LOW (ref 39.0–52.0)
Hemoglobin: 12.9 g/dL — ABNORMAL LOW (ref 13.0–17.0)
Immature Granulocytes: 1 %
Lymphocytes Relative: 23 %
Lymphs Abs: 1 10*3/uL (ref 0.7–4.0)
MCH: 33.4 pg (ref 26.0–34.0)
MCHC: 34.5 g/dL (ref 30.0–36.0)
MCV: 96.9 fL (ref 80.0–100.0)
Monocytes Absolute: 0.5 10*3/uL (ref 0.1–1.0)
Monocytes Relative: 11 %
Neutro Abs: 2.7 10*3/uL (ref 1.7–7.7)
Neutrophils Relative %: 59 %
Platelets: 103 10*3/uL — ABNORMAL LOW (ref 150–400)
RBC: 3.86 MIL/uL — ABNORMAL LOW (ref 4.22–5.81)
RDW: 14.6 % (ref 11.5–15.5)
WBC: 4.4 10*3/uL (ref 4.0–10.5)
nRBC: 0 % (ref 0.0–0.2)

## 2022-12-27 LAB — MAGNESIUM: Magnesium: 1.4 mg/dL — ABNORMAL LOW (ref 1.7–2.4)

## 2022-12-27 MED ORDER — SODIUM CHLORIDE 0.9 % IV SOLN
125.0000 mg/m2 | Freq: Once | INTRAVENOUS | Status: AC
  Administered 2022-12-27: 220 mg via INTRAVENOUS
  Filled 2022-12-27: qty 10

## 2022-12-27 MED ORDER — MAGNESIUM SULFATE 2 GM/50ML IV SOLN
2.0000 g | Freq: Once | INTRAVENOUS | Status: AC
  Administered 2022-12-27: 2 g via INTRAVENOUS
  Filled 2022-12-27: qty 50

## 2022-12-27 MED ORDER — SODIUM CHLORIDE 0.9 % IV SOLN
6.0000 mg/kg | Freq: Once | INTRAVENOUS | Status: AC
  Administered 2022-12-27: 400 mg via INTRAVENOUS
  Filled 2022-12-27: qty 20

## 2022-12-27 MED ORDER — PALONOSETRON HCL INJECTION 0.25 MG/5ML
0.2500 mg | Freq: Once | INTRAVENOUS | Status: AC
  Administered 2022-12-27: 0.25 mg via INTRAVENOUS
  Filled 2022-12-27: qty 5

## 2022-12-27 MED ORDER — HEPARIN SOD (PORK) LOCK FLUSH 100 UNIT/ML IV SOLN
500.0000 [IU] | Freq: Once | INTRAVENOUS | Status: AC | PRN
  Administered 2022-12-27: 500 [IU]
  Filled 2022-12-27: qty 5

## 2022-12-27 MED ORDER — MAGNESIUM SULFATE 4 GM/100ML IV SOLN
4.0000 g | Freq: Once | INTRAVENOUS | Status: DC
  Filled 2022-12-27: qty 100

## 2022-12-27 MED ORDER — SODIUM CHLORIDE 0.9 % IV SOLN
Freq: Once | INTRAVENOUS | Status: AC
  Filled 2022-12-27: qty 250

## 2022-12-27 MED ORDER — SODIUM CHLORIDE 0.9 % IV SOLN
10.0000 mg | Freq: Once | INTRAVENOUS | Status: AC
  Administered 2022-12-27: 10 mg via INTRAVENOUS
  Filled 2022-12-27: qty 10

## 2022-12-27 MED ORDER — ATROPINE SULFATE 1 MG/ML IV SOLN
0.5000 mg | Freq: Once | INTRAVENOUS | Status: AC | PRN
  Administered 2022-12-27: 0.5 mg via INTRAVENOUS
  Filled 2022-12-27: qty 1

## 2022-12-27 NOTE — Progress Notes (Signed)
Hematology/Oncology Consult note [facility]  Telephone:([phone] [phone]  Patient Care Team: Jodi Marble, MD as PCP - General (Internal Medicine) Clent Jacks, RN as Oncology Nurse Navigator Sindy Guadeloupe, MD as Consulting Physician (Hematology and Oncology)   Name of the patient: Eric Simon  [id number]  1951-08-27   Date of visit: 12/27/22  Diagnosis- metastatic colon cancer with metastases to intra-abdominal and mediastinal lymph nodes     Chief complaint/ Reason for visit-on treatment assessment prior to next cycle of panitumumab irinotecan chemotherapy  Heme/Onc history: Patient is a 72 yr old male with >40 pack year history of smoking. He currently smokes 0.5ppd. he presented to the ER with symptoms of left sided chest pain and left arm pain. Troponin was negative ekg was unremarkable. Ct chest showed no PE. He was incidentally noted to have mediastinal and retrocrural adenopathy and a 2.1X2.3X4.4 cm retroaortic soft tissue lesion in the posterior left chest. 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  Low-grade activity in the left lateral fifth and sixth ribs associated with nondisplaced fractures likely reflecting healing response problem malignancy.   Patient underwent CT-guided biopsy of the retroperitoneal lymph node pathology showed metastatic adenocarcinoma compatible with colorectal origin.  CK7 negative.  CK20 positive.  CDX 2+.  TTF-1 negative.  PSA negative.  This pattern of immunoreactivity supports the above diagnosis. Patient underwent colonoscopy which showed sigmoid mass that was consistent with adenocarcinoma.  RAS panel testing showed that he was wild-type for both K-ras and BRAF   FOLFOX and bevacizumab chemotherapy started in July 2020.  Subsequently oxaliplatin has been on hold given neuropathy.   Recent scans have shown slow increase in the size of intra-abdominal adenopathy but patient continues to have low-volume disease.plan is to switch him to second line Xeloda irinotecan panitumumab  regimen.  Patient received palliative radiation therapy to the enlarging external iliac lymph nodes  Interval history- Today he reports that he is doing and feeling well. Has been undergoing treatment since 2020 and states that he is "used to it" now. He denies any concerns or complaints. At  his last visit his Lyrica was increased to 150 mg at night to help with some neuropathy. He states that this has significantly improved his symptoms and he is very grateful about this. He denies any rash, diarrhea, vomiting. No chest pain, SOB, peripheral edema, fever. NO bleeding or bruising episodes. Appetite is good.   Wt Readings from Last 3 Encounters:  12/27/22 161 lb 6.4 oz (73.2 kg)  12/08/22 158 lb (71.7 kg)  11/16/22 157 lb 8 oz (71.4 kg)    ECOG PS- 1 Pain scale- 0  Review of systems- Review of Systems  Constitutional:  Negative for chills, fever, malaise/fatigue and weight loss.  HENT:  Negative for congestion, ear discharge and nosebleeds.   Eyes:  Negative for blurred vision.  Respiratory:  Negative for cough, hemoptysis, sputum production, shortness of breath and wheezing.   Cardiovascular:  Negative for chest pain, palpitations, orthopnea and claudication.  Gastrointestinal:  Negative for abdominal pain, blood in stool, constipation, diarrhea, heartburn, melena, nausea and vomiting.  Genitourinary:  Negative for dysuria, flank pain, frequency, hematuria and urgency.  Musculoskeletal:  Negative for back pain, joint pain and myalgias.  Skin:  Negative for rash.  Neurological:  Negative for dizziness, tingling, sensory change, focal weakness, seizures, weakness and headaches.  Endo/Heme/Allergies:  Does not bruise/bleed easily.  Psychiatric/Behavioral:  Negative for depression and suicidal ideas.  The patient does not have insomnia.       No Known Allergies   Past Medical History:  Diagnosis Date   Colon cancer ([place])    COPD (chronic obstructive pulmonary disease) ([place])    Hyperlipidemia      Past Surgical History:  Procedure Laterality Date   COLONOSCOPY WITH PROPOFOL N/A 06/26/2019   Procedure: COLONOSCOPY WITH PROPOFOL;  Surgeon: [name], MD;  Location: [facility] ENDOSCOPY;  Service: Gastroenterology;  Laterality: N/A;   FLEXIBLE SIGMOIDOSCOPY N/A 05/07/2021   Procedure: FLEXIBLE SIGMOIDOSCOPY;  Surgeon: [name], MD;  Location: [facility] ENDOSCOPY;  Service: Gastroenterology;  Laterality: N/A;   PORTA CATH INSERTION N/A 06/25/2019   Procedure: PORTA CATH INSERTION;  Surgeon: Algernon Huxley, MD;  Location: [facility] CV LAB;  Service: Cardiovascular;  Laterality: N/A;   PORTA CATH INSERTION Left 08/11/2020   Procedure: PORTA CATH INSERTION;  Surgeon: Algernon Huxley, MD;  Location: [place] CV LAB;  Service: Cardiovascular;  Laterality: Left;   PORTA CATH REMOVAL Right 08/11/2020   Procedure: PORTA CATH REMOVAL;  Surgeon: Algernon Huxley, MD;  Location: [facility] CV LAB;  Service: Cardiovascular;  Laterality: Right;    Social History   Socioeconomic History   Marital status: Single    Spouse name: Not on file   Number of children: Not on file   Years of education: Not on file   Highest education level: Not on file  Occupational History   Not on file  Tobacco Use   Smoking status: Every Day    Packs/day: 0.50    Years: 40.00    Total pack years: 20.00    Types: Cigarettes   Smokeless tobacco: Never   Tobacco comments:    smoking 40 years  Vaping Use   Vaping Use: Never used  Substance and Sexual Activity   Alcohol use: No   Drug use: No   Sexual activity: Not Currently  Other Topics Concern   Not on file  Social History Narrative   Not on file   Social Determinants of Health   Financial Resource Strain: Low Risk  (06/12/2019)   Overall Financial  Resource Strain (CARDIA)    Difficulty of Paying Living Expenses: Not hard at all  Food Insecurity: No Food Insecurity (06/12/2019)   Hunger Vital Sign    Worried About Running Out of Food in the Last Year: Never true    Ran Out of Food in the Last Year: Never true  Transportation Needs: No Transportation Needs (06/12/2019)   PRAPARE - Hydrologist (Medical): No    Lack of Transportation (Non-Medical): No  Physical Activity: Not on file  Stress: No Stress Concern Present (06/12/2019)   [place]    Feeling of Stress : Not at all  Social Connections: Unknown (06/12/2019)   Social Connection and Isolation Panel [NHANES]    Frequency of Communication with Friends and Family: More than three times a week    Frequency of Social Gatherings with Friends and Family: Not on file    Attends Religious Services: Not on file    Active Member of Clubs or Organizations: Not on file    Attends Archivist Meetings: Not on file    Marital Status: Not on file  Intimate Partner Violence: Not At Risk (06/12/2019)   Humiliation, Afraid, Rape, and Kick questionnaire  Fear of Current or Ex-Partner: No    Emotionally Abused: No    Physically Abused: No    Sexually Abused: No    Family History  Problem Relation Age of Onset   Brain cancer Father      Current Outpatient Medications:    aspirin EC 81 MG tablet, Take 81 mg by mouth daily., Disp: , Rfl:    azelastine (ASTELIN) 0.1 % nasal spray, Place 1 spray into both nostrils daily as needed., Disp: , Rfl:    butalbital-acetaminophen-caffeine (FIORICET) 50-325-40 MG tablet, Take 1 tablet by mouth every 4 (four) hours as needed., Disp: , Rfl:    capecitabine (XELODA) 500 MG tablet, Take 1 tablet (500 mg total) by mouth 2 (two) times daily after a meal. Take for 14 days, then hold for 7 days. Repeat every 21 days., Disp: 28 tablet, Rfl: 0   cetirizine  (ZYRTEC) 10 MG tablet, Take 1 tablet (10 mg total) by mouth daily., Disp: 30 tablet, Rfl: 0   chlorthalidone (HYGROTON) 25 MG tablet, Take 1 tablet by mouth daily., Disp: , Rfl:    cyclobenzaprine (FLEXERIL) 10 MG tablet, TAKE 1 TABLET(10 MG) BY MOUTH THREE TIMES DAILY AS NEEDED FOR MUSCLE SPASMS, Disp: 42 tablet, Rfl: 0   DEXILANT 60 MG capsule, Take 60 mg by mouth daily., Disp: , Rfl:    diphenoxylate-atropine (LOMOTIL) 2.5-0.025 MG tablet, Take 1 tablet by mouth 4 (four) times daily as needed for diarrhea or loose stools., Disp: 30 tablet, Rfl: 0   DULoxetine (CYMBALTA) 30 MG capsule, Take 1 capsule (30 mg total) by mouth 2 (two) times daily., Disp: 60 capsule, Rfl: 1   fluticasone (FLONASE) 50 MCG/ACT nasal spray, Place 2 sprays into both nostrils daily as needed., Disp: , Rfl:    lidocaine-prilocaine (EMLA) cream, Apply 1 Application topically as needed (pt will put 1 inch of cream over port 1 hourfor each treatment, cover plastic over the cream protect clothes)., Disp: 30 g, Rfl: 3   lisinopril (ZESTRIL) 5 MG tablet, Take 5 mg by mouth daily., Disp: , Rfl:    magic mouthwash (multi-ingredient) oral suspension, Swish and spit 5-10 mls by mouth 4 times a day as needed, Disp: 480 mL, Rfl: 3   montelukast (SINGULAIR) 10 MG tablet, Take 10 mg by mouth at bedtime., Disp: , Rfl:    NEXLETOL 180 MG TABS, Take 1 tablet by mouth daily., Disp: , Rfl:    oxyCODONE (OXY IR/ROXICODONE) 5 MG immediate release tablet, Take 1 tablet (5 mg total) by mouth every 6 (six) hours as needed for severe pain., Disp: 30 tablet, Rfl: 0   pregabalin (LYRICA) 75 MG capsule, Take lyrica 1 tablet in and and then 2 tablets at night, Disp: 90 capsule, Rfl: 2   simvastatin (ZOCOR) 20 MG tablet, Take 20 mg by mouth daily. , Disp: , Rfl:    SYMBICORT 80-4.5 MCG/ACT inhaler, Inhale 2 puffs into the lungs daily., Disp: , Rfl:    tamsulosin (FLOMAX) 0.4 MG CAPS capsule, Take 0.4 mg by mouth. , Disp: , Rfl:  No current  facility-administered medications for this visit.  Facility-Administered Medications Ordered in Other Visits:    atropine 1 MG/ML injection, , , ,    atropine injection 0.5 mg, 0.5 mg, Intravenous, Once PRN, Sindy Guadeloupe, MD   heparin lock flush 100 UNIT/ML injection, , , ,    heparin lock flush 100 unit/mL, 500 Units, Intracatheter, Once PRN, Sindy Guadeloupe, MD   irinotecan (CAMPTOSAR) 220 mg in  sodium chloride 0.9 % 500 mL chemo infusion, 125 mg/m2 (Treatment Plan Recorded), Intravenous, Once, Sindy Guadeloupe, MD   magnesium sulfate IVPB 2 g 50 mL, 2 g, Intravenous, Once, Wanetta Funderburke M, PA-C   magnesium sulfate IVPB 4 g 100 mL, 4 g, Intravenous, Once, Million Maharaj M, PA-C   panitumumab (VECTIBIX) 400 mg in sodium chloride 0.9 % 100 mL chemo infusion, 6 mg/kg (Treatment Plan Recorded), Intravenous, Once, Sindy Guadeloupe, MD, Last Rate: 240 mL/hr at 12/27/22 1007, 400 mg at 12/27/22 1007  Physical exam:  Vitals:   12/27/22 0840  BP: 126/76  Pulse: 78  Resp: 16  Temp: (!) 96.9 F (36.1 C)  TempSrc: Tympanic  SpO2: 99%  Weight: 161 lb 6.4 oz (73.2 kg)  Height: '5\' 7"'$  (1.702 m)   Physical Exam Cardiovascular:     Rate and Rhythm: Normal rate and regular rhythm.     Heart sounds: Normal heart sounds.  Pulmonary:     Effort: Pulmonary effort is normal.     Breath sounds: Normal breath sounds.  Abdominal:     General: Bowel sounds are normal.     Palpations: Abdomen is soft.  Skin:    General: Skin is warm and dry.  Neurological:     Mental Status: He is alert and oriented to person, place, and time.         Latest Ref Rng & Units 12/27/2022    8:32 AM  CMP  Glucose 70 - 99 mg/dL 84   BUN 8 - 23 mg/dL 19   Creatinine 0.61 - 1.24 mg/dL 0.93   Sodium 135 - 145 mmol/L 138   Potassium 3.5 - 5.1 mmol/L 3.7   Chloride 98 - 111 mmol/L 104   CO2 22 - 32 mmol/L 23   Calcium 8.9 - 10.3 mg/dL 8.5   Total Protein 6.5 - 8.1 g/dL 6.6   Total Bilirubin 0.3 - 1.2 mg/dL 0.4    Alkaline Phos 38 - 126 U/L 121   AST 15 - 41 U/L 33   ALT 0 - 44 U/L 21       Latest Ref Rng & Units 12/27/2022    8:32 AM  CBC  WBC 4.0 - 10.5 K/uL 4.4   Hemoglobin 13.0 - 17.0 g/dL 12.9   Hematocrit 39.0 - 52.0 % 37.4   Platelets 150 - 400 K/uL 103     Assessment and plan- Patient is a 72 y.o. male with metastatic colon cancer with lymph node metastases.   He is here for on treatment assessment prior to cycle 19 of panitumumab irinotecan chemotherapy  Counts okay to proceed with cycle 20 of panitumumab irinotecan chemotherapy today.  Given his hypomagnesemia will administer 2g Magnesium and have him return this week for lab recheck. Currently taking his Xeloda- will hold off on oral magnesium supplementation at this time given concurrent Xeloda use to prevent GI loss and further hypomagnesemia. In terms of imaging, per Dr. [name], the plan is to repeat scans again in February 2024.  Thrombocytopenia: Chronic and essentially stable with a value of 103 today. No bleeding or bruising episodes. Xeloda versus cirrhosis in nature. Still planned to see GI to screen for esophageal varices.   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 Greatly improved with increased night time dose of Lyrica. Plan will be to continue current dose at this time.   Normocytic anemia: Chronic, mild. Stable. Likely secondary to chemotherapy. No bleeding episodes noted. See above regarding GI.  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 May be linked with his hypomagnesemia. I have encouraged increased calcium intake. Will monitor.    Visit Diagnosis 1. Encounter for antineoplastic chemotherapy   2. 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 ([place])   3. Encounter for monoclonal antibody treatment for malignancy   4. Chemotherapy-induced peripheral neuropathy ([place])   5. Hypomagnesemia   6. Chemotherapy-induced thrombocytopenia   7. Antineoplastic chemotherapy induced anemia   8.  Hypocalcemia      Nelwyn Salisbury PA-C [facility] [phone] 12/27/2022 10:28 AM

## 2022-12-27 NOTE — Patient Instructions (Signed)
[facility] CANCER CTR AT [place]  Discharge Instructions: Thank you for choosing [facility] to provide your oncology and hematology care.  If you have a lab appointment with the [facility], please go directly to the [facility] and check in at the registration area.  Wear comfortable clothing and clothing appropriate for easy access to any Portacath or PICC line.   We strive to give you quality time with your provider. You may need to reschedule your appointment if you arrive late (15 or more minutes).  Arriving late affects you and other patients whose appointments are after yours.  Also, if you miss three or more appointments without notifying the office, you may be dismissed from the clinic at the provider's discretion.      For prescription refill requests, have your pharmacy contact our office and allow 72 hours for refills to be completed.    Today you received the following chemotherapy and/or immunotherapy agents Panitumumab and Irinotecan        To help prevent nausea and vomiting after your treatment, we encourage you to take your nausea medication as directed.  BELOW ARE SYMPTOMS THAT SHOULD BE REPORTED IMMEDIATELY: *FEVER GREATER THAN 100.4 F (38 C) OR HIGHER *CHILLS OR SWEATING *NAUSEA AND VOMITING THAT IS NOT CONTROLLED WITH YOUR NAUSEA MEDICATION *UNUSUAL SHORTNESS OF BREATH *UNUSUAL BRUISING OR BLEEDING *URINARY PROBLEMS (pain or burning when urinating, or frequent urination) *BOWEL PROBLEMS (unusual diarrhea, constipation, pain near the anus) TENDERNESS IN MOUTH AND THROAT WITH OR WITHOUT PRESENCE OF ULCERS (sore throat, sores in mouth, or a toothache) UNUSUAL RASH, SWELLING OR PAIN  UNUSUAL VAGINAL DISCHARGE OR ITCHING   Items with * indicate a potential emergency and should be followed up as soon as possible or go to the Emergency Department if any problems should occur.  Please show the CHEMOTHERAPY ALERT CARD or IMMUNOTHERAPY ALERT  CARD at check-in to the Emergency Department and triage nurse.  Should you have questions after your visit or need to cancel or reschedule your appointment, please contact [facility] [place] AT [place]  [phone] and follow the prompts.  Office hours are 8:00 a.m. to 4:30 p.m. Monday - Friday. Please note that voicemails left after 4:00 p.m. may not be returned until the following business day.  We are closed weekends and major holidays. You have access to a nurse at all times for urgent questions. Please call the main number to the clinic [phone] and follow the prompts.  For any non-urgent questions, you may also contact your provider using MyChart. We now offer e-Visits for anyone 21 and older to request care online for non-urgent symptoms. For details visit [email or web address].   Also download the MyChart app! Go to the app store, search "MyChart", open the app, select [facility], and log in with your MyChart username and password.    Magnesium Sulfate Injection What is this medication? MAGNESIUM SULFATE (mag NEE zee um SUL fate) prevents and treats low levels of magnesium in your body. It may also be used to prevent and treat seizures during pregnancy in people with high blood pressure disorders, such as preeclampsia or eclampsia. Magnesium plays an important role in maintaining the health of your muscles and nervous system. This medicine may be used for other purposes; ask your health care provider or pharmacist if you have questions. What should I tell my care team before I take this medication? They need to know if you have any of these conditions: Heart disease History of  irregular heart beat Kidney disease An unusual or allergic reaction to magnesium sulfate, medications, foods, dyes, or preservatives Pregnant or trying to get pregnant Breast-feeding How should I use this medication? This medication is for infusion into a vein. It is given in a hospital or  clinic setting. Talk to your care team about the use of this medication in children. While this medication may be prescribed for selected conditions, precautions do apply. Overdosage: If you think you have taken too much of this medicine contact a poison control center or emergency room at once. NOTE: This medicine is only for you. Do not share this medicine with others. What if I miss a dose? This does not apply. What may interact with this medication? Certain medications for anxiety or sleep Certain medications for seizures, such phenobarbital Digoxin Medications that relax muscles for surgery Narcotic medications for pain This list may not describe all possible interactions. Give your health care provider a list of all the medicines, herbs, non-prescription drugs, or dietary supplements you use. Also tell them if you smoke, drink alcohol, or use illegal drugs. Some items may interact with your medicine. What should I watch for while using this medication? Your condition will be monitored carefully while you are receiving this medication. You may need blood work done while you are receiving this medication. What side effects may I notice from receiving this medication? Side effects that you should report to your care team as soon as possible: Allergic reactions--skin rash, itching, hives, swelling of the face, lips, tongue, or throat High magnesium level--confusion, drowsiness, facial flushing, redness, sweating, muscle weakness, fast or irregular heartbeat, trouble breathing Low blood pressure--dizziness, feeling faint or lightheaded, blurry vision Side effects that usually do not require medical attention (report to your care team if they continue or are bothersome): Headache Nausea This list may not describe all possible side effects. Call your doctor for medical advice about side effects. You may report side effects to FDA at 1-800-FDA-1088. Where should I keep my medication? This  medication is given in a hospital or clinic and will not be stored at home. NOTE: This sheet is a summary. It may not cover all possible information. If you have questions about this medicine, talk to your doctor, pharmacist, or health care provider.  2023 Elsevier/Gold Standard (2013-04-06 00:00:00)

## 2022-12-27 NOTE — Progress Notes (Signed)
Magnesium 1.4, Per Nelwyn Salisbury PA pt to receive 4 grams IV Magnesium and treatment.  Pt reports due to a personal time restraint he would like to not get 4 grams IV Magnesium that will create his treatment to run 30 minutes longer. Per Nelwyn Salisbury PA okay to hold 4 grams IV Magnesium and give 2 grams IV Magnesium instead. Pt scheduled to come back Friday for lab recheck, [facility] and possible Magnesium per Nelwyn Salisbury PA. Pt aware and agrees to plan.

## 2022-12-29 ENCOUNTER — Other Ambulatory Visit: Payer: Medicare HMO

## 2022-12-29 ENCOUNTER — Ambulatory Visit: Payer: Medicare HMO

## 2022-12-29 ENCOUNTER — Ambulatory Visit: Payer: Medicare HMO | Admitting: Medical Oncology

## 2022-12-30 ENCOUNTER — Other Ambulatory Visit: Payer: Self-pay | Admitting: Oncology

## 2022-12-30 ENCOUNTER — Ambulatory Visit (INDEPENDENT_AMBULATORY_CARE_PROVIDER_SITE_OTHER): Payer: Medicare HMO | Admitting: Gastroenterology

## 2022-12-30 ENCOUNTER — Encounter: Payer: Self-pay | Admitting: Oncology

## 2022-12-30 ENCOUNTER — Encounter: Payer: Self-pay | Admitting: Gastroenterology

## 2022-12-30 VITALS — BP 128/74 | HR 78 | Temp 97.8°F | Ht 67.0 in | Wt 162.1 lb

## 2022-12-30 DIAGNOSIS — C189 Malignant neoplasm of colon, unspecified: Secondary | ICD-10-CM

## 2022-12-30 DIAGNOSIS — K7460 Unspecified cirrhosis of liver: Secondary | ICD-10-CM | POA: Diagnosis not present

## 2022-12-30 NOTE — Addendum Note (Signed)
Addended by: Jacqualin Combes on: 12/30/2022 03:42 PM   Modules accepted: Orders

## 2022-12-30 NOTE — Progress Notes (Signed)
[name] MD, MRCP(U.K) [address]  [place]  [place], [place] [address]  Main: [phone]  Fax: [phone]   Primary Care Physician: Jodi Marble, MD  Primary Gastroenterologist:  Dr. [name]   Chief Complaint  Patient presents with   Follow-up    HPI: Eric Simon is a 72 y.o. male   Summary of history :  Last unit our office in June 2022 for rectal bleeding.  History of colon rectal cancer large internal hemorrhoids were seen during his index sigmoidoscopy in May 2022 he had a posterior anal fissure causing severe pain.  At that point of time he was brought in for hemorrhoidal banding but he decided not to proceed with the same at that time.  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  He was found to have thrombocytopenia and asked to see [place] to evaluate for A-fib due to varices.  Recent CT chest abdomen and pelvis with contrast in October 2023 showed somewhat coarse contour of the liver.  This may be suggestive of cirrhosis mild splenomegaly.  January 2024 albumin 3.6 creatinine 0.93 total bilirubin 0.4 hemoglobin 12.9 g with a platelet count of 103.  He says he is doing well from the colon cancer point of view denies any constipation denies any rectal bleeding.  Gaining weight.  Current Outpatient Medications  Medication Sig Dispense Refill   aspirin EC 81 MG tablet Take 81 mg by mouth daily.     azelastine (ASTELIN) 0.1 % nasal spray Place 1 spray into both nostrils daily as needed.     butalbital-acetaminophen-caffeine (FIORICET) 50-325-40 MG tablet Take 1 tablet by mouth every 4 (four) hours as needed.     capecitabine (XELODA) 500 MG tablet Take 1 tablet (500 mg total) by mouth 2 (two) times daily after a meal. Take for 14 days, then hold for 7 days. Repeat every 21 days. 28 tablet 0   cetirizine (ZYRTEC) 10 MG tablet Take 1 tablet (10 mg total) by mouth daily.  30 tablet 0   chlorthalidone (HYGROTON) 25 MG tablet Take 1 tablet by mouth daily.     cyclobenzaprine (FLEXERIL) 10 MG tablet TAKE 1 TABLET(10 MG) BY MOUTH THREE TIMES DAILY AS NEEDED FOR MUSCLE SPASMS 42 tablet 0   DEXILANT 60 MG capsule Take 60 mg by mouth daily.     diphenoxylate-atropine (LOMOTIL) 2.5-0.025 MG tablet Take 1 tablet by mouth 4 (four) times daily as needed for diarrhea or loose stools. 30 tablet 0   DULoxetine (CYMBALTA) 30 MG capsule Take 1 capsule (30 mg total) by mouth 2 (two) times daily. 60 capsule 1   fluticasone (FLONASE) 50 MCG/ACT nasal spray Place 2 sprays into both nostrils daily as needed.     lidocaine-prilocaine (EMLA) cream Apply 1 Application topically as needed (pt will put 1 inch of cream over port 1 hourfor each treatment, cover plastic over the cream protect clothes). 30 g 3   lisinopril (ZESTRIL) 5 MG tablet Take 5 mg by mouth daily.     magic mouthwash (multi-ingredient) oral suspension Swish and spit 5-10 mls by mouth 4 times a day as needed 480 mL 3   montelukast (SINGULAIR) 10 MG tablet Take 10 mg by mouth at bedtime.     NEXLETOL 180 MG TABS Take 1 tablet by mouth daily.     oxyCODONE (OXY IR/ROXICODONE) 5 MG immediate release tablet Take 1 tablet (5 mg total) by  mouth every 6 (six) hours as needed for severe pain. 30 tablet 0   pregabalin (LYRICA) 75 MG capsule Take lyrica 1 tablet in and and then 2 tablets at night 90 capsule 2   simvastatin (ZOCOR) 20 MG tablet Take 20 mg by mouth daily.      SYMBICORT 80-4.5 MCG/ACT inhaler Inhale 2 puffs into the lungs daily.     tamsulosin (FLOMAX) 0.4 MG CAPS capsule Take 0.4 mg by mouth.      No current facility-administered medications for this visit.   Facility-Administered Medications Ordered in Other Visits  Medication Dose Route Frequency Provider Last Rate Last Admin   atropine 1 MG/ML injection            heparin lock flush 100 UNIT/ML injection             Allergies as of 12/30/2022   (No Known  Allergies)    ROS:  General: Negative for anorexia, weight loss, fever, chills, fatigue, weakness. ENT: Negative for hoarseness, difficulty swallowing , nasal congestion. CV: Negative for chest pain, angina, palpitations, dyspnea on exertion, peripheral edema.  Respiratory: Negative for dyspnea at rest, dyspnea on exertion, cough, sputum, wheezing.  GI: See history of present illness. GU:  Negative for dysuria, hematuria, urinary incontinence, urinary frequency, nocturnal urination.  Endo: Negative for unusual weight change.    Physical Examination:   BP 128/74   Pulse 78   Temp 97.8 F (36.6 C) (Oral)   Ht '5\' 7"'$  (1.702 m)   Wt 162 lb 2 oz (73.5 kg)   BMI 25.39 kg/m   General: Well-nourished, well-developed in no acute distress.  Eyes: No icterus. Conjunctivae pink. Neuro: Alert and oriented x 3.  Grossly intact. Skin: Warm and dry, no jaundice.   Psych: Alert and cooperative, normal mood and affect.   Imaging Studies: No results found.  Assessment and Plan:   Eric Simon is a 72 y.o. y/o male with a history of metastatic colon cancer on chemotherapy.  Found to have thrombocytopenia of unclear etiology abdominal imaging suggest possible cirrhosis with splenomegaly.  Asked to see [place] for an EGD to rule out esophageal varices.  Plan. 1.  EGD for esophageal variceal screening  I have discussed alternative options, risks & benefits,  which include, but are not limited to, bleeding, infection, perforation,respiratory complication & drug reaction.  The patient agrees with this plan & written consent will be obtained.     Dr [name]  MD,MRCP [facility]) Follow up in as needed

## 2022-12-30 NOTE — Addendum Note (Signed)
Addended by: Jacqualin Combes on: 12/30/2022 02:07 PM   Modules accepted: Orders

## 2022-12-31 ENCOUNTER — Inpatient Hospital Stay: Payer: Medicare HMO

## 2022-12-31 ENCOUNTER — Inpatient Hospital Stay (HOSPITAL_BASED_OUTPATIENT_CLINIC_OR_DEPARTMENT_OTHER): Payer: Medicare HMO | Admitting: Medical Oncology

## 2022-12-31 ENCOUNTER — Encounter: Payer: Self-pay | Admitting: Medical Oncology

## 2022-12-31 DIAGNOSIS — C189 Malignant neoplasm of colon, unspecified: Secondary | ICD-10-CM | POA: Diagnosis not present

## 2022-12-31 DIAGNOSIS — D6959 Other secondary thrombocytopenia: Secondary | ICD-10-CM | POA: Diagnosis not present

## 2022-12-31 DIAGNOSIS — Z5112 Encounter for antineoplastic immunotherapy: Secondary | ICD-10-CM | POA: Diagnosis not present

## 2022-12-31 DIAGNOSIS — C772 Secondary and unspecified malignant neoplasm of intra-abdominal lymph nodes: Secondary | ICD-10-CM | POA: Diagnosis not present

## 2022-12-31 DIAGNOSIS — D6481 Anemia due to antineoplastic chemotherapy: Secondary | ICD-10-CM | POA: Diagnosis not present

## 2022-12-31 DIAGNOSIS — Z5111 Encounter for antineoplastic chemotherapy: Secondary | ICD-10-CM | POA: Diagnosis not present

## 2022-12-31 DIAGNOSIS — G620 Drug-induced polyneuropathy: Secondary | ICD-10-CM | POA: Diagnosis not present

## 2022-12-31 LAB — CBC WITH DIFFERENTIAL/PLATELET
Abs Immature Granulocytes: 0.09 10*3/uL — ABNORMAL HIGH (ref 0.00–0.07)
Basophils Absolute: 0 10*3/uL (ref 0.0–0.1)
Basophils Relative: 1 %
Eosinophils Absolute: 0.2 10*3/uL (ref 0.0–0.5)
Eosinophils Relative: 4 %
HCT: 35.9 % — ABNORMAL LOW (ref 39.0–52.0)
Hemoglobin: 12.8 g/dL — ABNORMAL LOW (ref 13.0–17.0)
Immature Granulocytes: 2 %
Lymphocytes Relative: 25 %
Lymphs Abs: 1.1 10*3/uL (ref 0.7–4.0)
MCH: 33.2 pg (ref 26.0–34.0)
MCHC: 35.7 g/dL (ref 30.0–36.0)
MCV: 93 fL (ref 80.0–100.0)
Monocytes Absolute: 0.3 10*3/uL (ref 0.1–1.0)
Monocytes Relative: 8 %
Neutro Abs: 2.7 10*3/uL (ref 1.7–7.7)
Neutrophils Relative %: 60 %
Platelets: 108 10*3/uL — ABNORMAL LOW (ref 150–400)
RBC: 3.86 MIL/uL — ABNORMAL LOW (ref 4.22–5.81)
RDW: 14 % (ref 11.5–15.5)
WBC: 4.5 10*3/uL (ref 4.0–10.5)
nRBC: 0 % (ref 0.0–0.2)

## 2022-12-31 LAB — COMPREHENSIVE METABOLIC PANEL
ALT: 26 U/L (ref 0–44)
AST: 28 U/L (ref 15–41)
Albumin: 3.6 g/dL (ref 3.5–5.0)
Alkaline Phosphatase: 118 U/L (ref 38–126)
Anion gap: 10 (ref 5–15)
BUN: 24 mg/dL — ABNORMAL HIGH (ref 8–23)
CO2: 23 mmol/L (ref 22–32)
Calcium: 7.5 mg/dL — ABNORMAL LOW (ref 8.9–10.3)
Chloride: 104 mmol/L (ref 98–111)
Creatinine, Ser: 0.98 mg/dL (ref 0.61–1.24)
GFR, Estimated: >60 mL/min (ref >60)
Glucose, Bld: 94 mg/dL (ref 70–99)
Potassium: 3.9 mmol/L (ref 3.5–5.1)
Sodium: 137 mmol/L (ref 135–145)
Total Bilirubin: 0.5 mg/dL (ref 0.3–1.2)
Total Protein: 6.8 g/dL (ref 6.5–8.1)

## 2022-12-31 LAB — MAGNESIUM: Magnesium: 1.1 mg/dL — ABNORMAL LOW (ref 1.7–2.4)

## 2022-12-31 MED ORDER — SODIUM CHLORIDE 0.9 % IV SOLN
INTRAVENOUS | Status: DC
  Filled 2022-12-31: qty 250

## 2022-12-31 MED ORDER — SODIUM CHLORIDE 0.9% FLUSH
10.0000 mL | Freq: Once | INTRAVENOUS | Status: AC
  Administered 2022-12-31: 10 mL via INTRAVENOUS
  Filled 2022-12-31: qty 10

## 2022-12-31 MED ORDER — HEPARIN SOD (PORK) LOCK FLUSH 100 UNIT/ML IV SOLN
500.0000 [IU] | Freq: Once | INTRAVENOUS | Status: AC
  Administered 2022-12-31: 500 [IU] via INTRAVENOUS
  Filled 2022-12-31: qty 5

## 2022-12-31 MED ORDER — MAGNESIUM SULFATE 2 GM/50ML IV SOLN
2.0000 g | Freq: Once | INTRAVENOUS | Status: AC
  Administered 2022-12-31: 2 g via INTRAVENOUS

## 2022-12-31 NOTE — Progress Notes (Signed)
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 He currently smokes 0.5ppd. he presented to the ER with symptoms of left sided chest pain and left arm pain. Troponin was negative ekg was unremarkable. Ct chest showed no PE. He was incidentally noted to have mediastinal and retrocrural adenopathy and a 2.1X2.3X4.4 cm retroaortic soft tissue lesion in the posterior left chest. 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  Low-grade activity in the left lateral fifth and sixth ribs associated with nondisplaced fractures likely reflecting healing response problem malignancy.   Patient underwent CT-guided biopsy of the retroperitoneal lymph node pathology showed metastatic adenocarcinoma compatible with colorectal origin.  CK7 negative.  CK20 positive.  CDX 2+.  TTF-1 negative.  PSA negative.  This pattern of immunoreactivity supports the above diagnosis. Patient underwent colonoscopy which showed sigmoid mass that was consistent with adenocarcinoma.  RAS panel testing showed that he was wild-type for both K-ras and BRAF   FOLFOX and bevacizumab chemotherapy started in July 2020.  Subsequently oxaliplatin has been on hold given neuropathy.   Recent scans have shown slow increase in the size of intra-abdominal adenopathy but patient continues to have low-volume disease.plan is to switch him to second line Xeloda irinotecan panitumumab  regimen.  Patient received palliative radiation therapy to the enlarging external iliac lymph nodes  Interval history- At his last visit on 12/27/2022 he was seen for consideration of irinotecan and pantitumab. His magnesium level was found to be a bit low at 1.4. He received 2g of IV supplementation and return today for labs check and consideration of additional IV magnesium. He reports that he is eating/drinking well and is not having any vomiting or diarrhea.   Wt Readings from Last 3 Encounters:  12/31/22 161 lb (73 kg)  12/30/22 162 lb 2 oz (73.5 kg)  12/27/22 161 lb 6.4 oz (73.2 kg)    ECOG PS- 1 Pain scale- 0  Review of systems- Review of Systems  Constitutional:  Negative for chills, fever, malaise/fatigue and weight loss.  HENT:  Negative for congestion, ear discharge and nosebleeds.   Eyes:  Negative for blurred vision.  Respiratory:  Negative for cough, hemoptysis, sputum production, shortness of breath and wheezing.   Cardiovascular:  Negative for chest pain, palpitations, orthopnea and claudication.  Gastrointestinal:  Negative for abdominal pain, blood in stool, constipation, diarrhea, heartburn, melena, nausea and vomiting.  Genitourinary:  Negative for dysuria, flank pain, frequency, hematuria and urgency.  Musculoskeletal:  Negative for back pain, joint pain and myalgias.  Skin:  Negative for rash.  Neurological:  Negative for dizziness, tingling, sensory change, focal weakness, seizures, weakness and headaches.  Endo/Heme/Allergies:  Does not bruise/bleed easily.  Psychiatric/Behavioral:  Negative for depression and suicidal ideas. The patient does not have insomnia.       No  Known Allergies   Past Medical History:  Diagnosis Date   Colon cancer ([place])    COPD (chronic  obstructive pulmonary disease) ([place])    Hyperlipidemia      Past Surgical History:  Procedure Laterality Date   COLONOSCOPY WITH PROPOFOL N/A 06/26/2019   Procedure: COLONOSCOPY WITH PROPOFOL;  Surgeon: [name], MD;  Location[facility] ENDOSCOPY;  Service: Gastroenterology;  Laterality: N/A;   FLEXIBLE SIGMOIDOSCOPY N/A 05/07/2021   Procedure: FLEXIBLE SIGMOIDOSCOPY;  Surgeon: [name], MD;  Location: [facility] ENDOSCOPY;  Service: Gastroenterology;  Laterality: N/A;   PORTA CATH INSERTION N/A 06/25/2019   Procedure: PORTA CATH INSERTION;  Surgeon: Algernon Huxley, MD;  Location: [place] CV LAB;  Service: Cardiovascular;  Laterality: N/A;   PORTA CATH INSERTION Left 08/11/2020   Procedure: PORTA CATH INSERTION;  Surgeon: Algernon Huxley, MD;  Location: [place] CV LAB;  Service: Cardiovascular;  Laterality: Left;   PORTA CATH REMOVAL Right 08/11/2020   Procedure: PORTA CATH REMOVAL;  Surgeon: Algernon Huxley, MD;  Location: [place] CV LAB;  Service: Cardiovascular;  Laterality: Right;    Social History   Socioeconomic History   Marital status: Single    Spouse name: Not on file   Number of children: Not on file   Years of education: Not on file   Highest education level: Not on file  Occupational History   Not on file  Tobacco Use   Smoking status: Every Day    Packs/day: 0.50    Years: 40.00    Total pack years: 20.00    Types: Cigarettes   Smokeless tobacco: Never   Tobacco comments:    smoking 40 years  Vaping Use   Vaping Use: Never used  Substance and Sexual Activity   Alcohol use: No   Drug use: No   Sexual activity: Not Currently  Other Topics Concern   Not on file  Social History Narrative   Not on file   Social Determinants of Health   Financial Resource Strain: Low Risk  (06/12/2019)   Overall Financial Resource Strain (CARDIA)    Difficulty of Paying Living Expenses: Not hard at all  Food Insecurity: No Food Insecurity (06/12/2019)   Hunger Vital Sign     Worried About Running Out of Food in the Last Year: Never true    Ran Out of Food in the Last Year: Never true  Transportation Needs: No Transportation Needs (06/12/2019)   PRAPARE - Hydrologist (Medical): No    Lack of Transportation (Non-Medical): No  Physical Activity: Not on file  Stress: No Stress Concern Present (06/12/2019)   [place]    Feeling of Stress : Not at all  Social Connections: Unknown (06/12/2019)   Social Connection and Isolation Panel [NHANES]    Frequency of Communication with Friends and Family: More than three times a week    Frequency of Social Gatherings with Friends and Family: Not on file    Attends Religious Services: Not on file    Active Member of Clubs or Organizations: Not on file    Attends Archivist Meetings: Not on file    Marital Status: Not on file  Intimate Partner Violence: Not At Risk (06/12/2019)   Humiliation, Afraid, Rape, and Kick questionnaire    Fear of Current or Ex-Partner: No    Emotionally Abused: No    Physically Abused: No    Sexually Abused: No  Family History  Problem Relation Age of Onset   Brain cancer Father      Current Outpatient Medications:    aspirin EC 81 MG tablet, Take 81 mg by mouth daily., Disp: , Rfl:    azelastine (ASTELIN) 0.1 % nasal spray, Place 1 spray into both nostrils daily as needed., Disp: , Rfl:    butalbital-acetaminophen-caffeine (FIORICET) 50-325-40 MG tablet, Take 1 tablet by mouth every 4 (four) hours as needed., Disp: , Rfl:    capecitabine (XELODA) 500 MG tablet, Take 1 tablet (500 mg total) by mouth 2 (two) times daily after a meal. Take for 14 days, then hold for 7 days. Repeat every 21 days., Disp: 28 tablet, Rfl: 0   cetirizine (ZYRTEC) 10 MG tablet, Take 1 tablet (10 mg total) by mouth daily., Disp: 30 tablet, Rfl: 0   chlorthalidone (HYGROTON) 25 MG tablet, Take 1 tablet by mouth  daily., Disp: , Rfl:    cyclobenzaprine (FLEXERIL) 10 MG tablet, TAKE 1 TABLET(10 MG) BY MOUTH THREE TIMES DAILY AS NEEDED FOR MUSCLE SPASMS, Disp: 42 tablet, Rfl: 0   DEXILANT 60 MG capsule, Take 60 mg by mouth daily., Disp: , Rfl:    diphenoxylate-atropine (LOMOTIL) 2.5-0.025 MG tablet, Take 1 tablet by mouth 4 (four) times daily as needed for diarrhea or loose stools., Disp: 30 tablet, Rfl: 0   DULoxetine (CYMBALTA) 30 MG capsule, Take 1 capsule (30 mg total) by mouth 2 (two) times daily., Disp: 60 capsule, Rfl: 1   fluticasone (FLONASE) 50 MCG/ACT nasal spray, Place 2 sprays into both nostrils daily as needed., Disp: , Rfl:    lidocaine-prilocaine (EMLA) cream, Apply 1 Application topically as needed (pt will put 1 inch of cream over port 1 hourfor each treatment, cover plastic over the cream protect clothes)., Disp: 30 g, Rfl: 3   lisinopril (ZESTRIL) 5 MG tablet, Take 5 mg by mouth daily., Disp: , Rfl:    magic mouthwash (multi-ingredient) oral suspension, Swish and spit 5-10 mls by mouth 4 times a day as needed, Disp: 480 mL, Rfl: 3   montelukast (SINGULAIR) 10 MG tablet, Take 10 mg by mouth at bedtime., Disp: , Rfl:    NEXLETOL 180 MG TABS, Take 1 tablet by mouth daily., Disp: , Rfl:    oxyCODONE (OXY IR/ROXICODONE) 5 MG immediate release tablet, Take 1 tablet (5 mg total) by mouth every 6 (six) hours as needed for severe pain., Disp: 30 tablet, Rfl: 0   pregabalin (LYRICA) 75 MG capsule, Take lyrica 1 tablet in and and then 2 tablets at night, Disp: 90 capsule, Rfl: 2   simvastatin (ZOCOR) 20 MG tablet, Take 20 mg by mouth daily. , Disp: , Rfl:    SYMBICORT 80-4.5 MCG/ACT inhaler, Inhale 2 puffs into the lungs daily., Disp: , Rfl:    tamsulosin (FLOMAX) 0.4 MG CAPS capsule, Take 0.4 mg by mouth. , Disp: , Rfl:  No current facility-administered medications for this visit.  Facility-Administered Medications Ordered in Other Visits:    0.9 %  sodium chloride infusion, , Intravenous,  Continuous, Dorcas Melito M, PA-C, Stopped at 12/31/22 1453   atropine 1 MG/ML injection, , , ,    heparin lock flush 100 UNIT/ML injection, , , ,   Physical exam:  Vitals:   12/31/22 1310  BP: 129/68  Pulse: 72  Resp: 16  Temp: (!) 96.1 F (35.6 C)  TempSrc: Tympanic  SpO2: 99%  Weight: 161 lb (73 kg)  Height: '5\' 7"'$  (1.702 m)   Physical  Exam Cardiovascular:     Rate and Rhythm: Normal rate and regular rhythm.     Heart sounds: Normal heart sounds.  Pulmonary:     Effort: Pulmonary effort is normal.     Breath sounds: Normal breath sounds.  Abdominal:     General: Bowel sounds are normal.     Palpations: Abdomen is soft.  Skin:    General: Skin is warm and dry.  Neurological:     Mental Status: He is alert and oriented to person, place, and time.         Latest Ref Rng & Units 12/31/2022    1:00 PM  CMP  Glucose 70 - 99 mg/dL 94   BUN 8 - 23 mg/dL 24   Creatinine 0.61 - 1.24 mg/dL 0.98   Sodium 135 - 145 mmol/L 137   Potassium 3.5 - 5.1 mmol/L 3.9   Chloride 98 - 111 mmol/L 104   CO2 22 - 32 mmol/L 23   Calcium 8.9 - 10.3 mg/dL 7.5   Total Protein 6.5 - 8.1 g/dL 6.8   Total Bilirubin 0.3 - 1.2 mg/dL 0.5   Alkaline Phos 38 - 126 U/L 118   AST 15 - 41 U/L 28   ALT 0 - 44 U/L 26       Latest Ref Rng & Units 12/31/2022    1:00 PM  CBC  WBC 4.0 - 10.5 K/uL 4.5   Hemoglobin 13.0 - 17.0 g/dL 12.8   Hematocrit 39.0 - 52.0 % 35.9   Platelets 150 - 400 K/uL 108     Assessment and plan- Patient is a 72 y.o. male with metastatic colon cancer with lymph node metastases.   He is here for on treatment assessment prior to cycle 19 of panitumumab irinotecan chemotherapy  Hypomagnesemia: Acute on chornic. He will restart him oral home magnesium and we will give 4 g magnesium here in office. Has follow up next week with labs.   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 May be linked with his hypomagnesemia. I have encouraged increased calcium intake. Decreased today. Will  monitor and has follow up next week.   Disposition: 4g Magnesium IV here today Restart home oral mag RTC MD prior to restarting his xeloda (taylor assisting in scheduling) Has follow up already scheduled on 02/05 with labs    Visit Diagnosis 1. Hypomagnesemia   2. Hypocalcemia      Minna Antis [facility] [phone] 12/31/2022 4:42 PM

## 2023-01-01 ENCOUNTER — Other Ambulatory Visit: Payer: Self-pay | Admitting: Oncology

## 2023-01-05 ENCOUNTER — Other Ambulatory Visit (HOSPITAL_COMMUNITY): Payer: Self-pay

## 2023-01-05 ENCOUNTER — Other Ambulatory Visit: Payer: Self-pay | Admitting: Oncology

## 2023-01-05 DIAGNOSIS — C772 Secondary and unspecified malignant neoplasm of intra-abdominal lymph nodes: Secondary | ICD-10-CM

## 2023-01-05 MED ORDER — CAPECITABINE 500 MG PO TABS
500.0000 mg | ORAL_TABLET | Freq: Two times a day (BID) | ORAL | 0 refills | Status: DC
  Filled 2023-01-05: qty 28, 21d supply, fill #0
  Filled 2023-01-05: qty 28, 14d supply, fill #0

## 2023-01-10 ENCOUNTER — Other Ambulatory Visit: Payer: Self-pay

## 2023-01-12 ENCOUNTER — Ambulatory Visit: Payer: Medicare HMO | Admitting: Anesthesiology

## 2023-01-12 ENCOUNTER — Encounter
Admission: RE | Disposition: A | Discharge status: Home / Self Care | Payer: Self-pay | Source: Home / Self Care | Attending: Gastroenterology

## 2023-01-12 ENCOUNTER — Ambulatory Visit
Admission: RE | Admit: 2023-01-12 | Discharge: 2023-01-12 | Disposition: A | Discharge status: Home / Self Care | Payer: Medicare HMO | Attending: Gastroenterology | Admitting: Gastroenterology

## 2023-01-12 DIAGNOSIS — C772 Secondary and unspecified malignant neoplasm of intra-abdominal lymph nodes: Secondary | ICD-10-CM | POA: Insufficient documentation

## 2023-01-12 DIAGNOSIS — K259 Gastric ulcer, unspecified as acute or chronic, without hemorrhage or perforation: Secondary | ICD-10-CM | POA: Diagnosis not present

## 2023-01-12 DIAGNOSIS — K319 Disease of stomach and duodenum, unspecified: Secondary | ICD-10-CM | POA: Diagnosis not present

## 2023-01-12 DIAGNOSIS — D696 Thrombocytopenia, unspecified: Secondary | ICD-10-CM | POA: Diagnosis not present

## 2023-01-12 DIAGNOSIS — K7460 Unspecified cirrhosis of liver: Secondary | ICD-10-CM | POA: Diagnosis not present

## 2023-01-12 DIAGNOSIS — I2510 Atherosclerotic heart disease of native coronary artery without angina pectoris: Secondary | ICD-10-CM | POA: Insufficient documentation

## 2023-01-12 DIAGNOSIS — C771 Secondary and unspecified malignant neoplasm of intrathoracic lymph nodes: Secondary | ICD-10-CM | POA: Insufficient documentation

## 2023-01-12 DIAGNOSIS — D649 Anemia, unspecified: Secondary | ICD-10-CM | POA: Insufficient documentation

## 2023-01-12 DIAGNOSIS — C189 Malignant neoplasm of colon, unspecified: Secondary | ICD-10-CM | POA: Insufficient documentation

## 2023-01-12 DIAGNOSIS — E785 Hyperlipidemia, unspecified: Secondary | ICD-10-CM | POA: Insufficient documentation

## 2023-01-12 DIAGNOSIS — J449 Chronic obstructive pulmonary disease, unspecified: Secondary | ICD-10-CM | POA: Insufficient documentation

## 2023-01-12 DIAGNOSIS — K2970 Gastritis, unspecified, without bleeding: Secondary | ICD-10-CM | POA: Diagnosis not present

## 2023-01-12 DIAGNOSIS — F17210 Nicotine dependence, cigarettes, uncomplicated: Secondary | ICD-10-CM | POA: Insufficient documentation

## 2023-01-12 DIAGNOSIS — F17200 Nicotine dependence, unspecified, uncomplicated: Secondary | ICD-10-CM | POA: Diagnosis not present

## 2023-01-12 HISTORY — PX: ESOPHAGOGASTRODUODENOSCOPY (EGD) WITH PROPOFOL: SHX5813

## 2023-01-12 SURGERY — ESOPHAGOGASTRODUODENOSCOPY (EGD) WITH PROPOFOL
Anesthesia: General

## 2023-01-12 MED ORDER — PROPOFOL 1000 MG/100ML IV EMUL
INTRAVENOUS | Status: AC
  Filled 2023-01-12: qty 400

## 2023-01-12 MED ORDER — PROPOFOL 500 MG/50ML IV EMUL
INTRAVENOUS | Status: DC | PRN
  Administered 2023-01-12: 150 ug/kg/min via INTRAVENOUS
  Administered 2023-01-12: 100 mg via INTRAVENOUS

## 2023-01-12 MED ORDER — GLYCOPYRROLATE 0.2 MG/ML IJ SOLN
INTRAMUSCULAR | Status: DC | PRN
  Administered 2023-01-12: .2 mg via INTRAVENOUS

## 2023-01-12 MED ORDER — LIDOCAINE HCL (CARDIAC) PF 100 MG/5ML IV SOSY
PREFILLED_SYRINGE | INTRAVENOUS | Status: DC | PRN
  Administered 2023-01-12: 60 mg via INTRAVENOUS

## 2023-01-12 MED ORDER — PROPOFOL 10 MG/ML IV BOLUS
INTRAVENOUS | Status: AC
  Filled 2023-01-12: qty 40

## 2023-01-12 MED ORDER — SODIUM CHLORIDE 0.9 % IV SOLN: INTRAVENOUS | Status: DC

## 2023-01-12 MED ORDER — PHENYLEPHRINE HCL (PRESSORS) 10 MG/ML IV SOLN
INTRAVENOUS | Status: DC | PRN
  Administered 2023-01-12: 80 ug via INTRAVENOUS

## 2023-01-12 NOTE — Anesthesia Preprocedure Evaluation (Addendum)
Anesthesia Evaluation  Patient identified by MRN, date of birth, ID band Patient awake    Reviewed: Allergy & Precautions, NPO status , Patient's Chart, lab work & pertinent test results  History of Anesthesia Complications Negative for: history of anesthetic complications  Airway Mallampati: II  TM Distance: >3 FB Neck ROM: limited    Dental  (+) Missing, Poor Dentition, Upper Dentures   Pulmonary COPD, Current Smoker and Patient abstained from smoking.   Pulmonary exam normal        Cardiovascular Exercise Tolerance: Good (-) angina + CAD (seen on CT scan)  Normal cardiovascular exam     Neuro/Psych negative neurological ROS  negative psych ROS   GI/Hepatic ,,,(+) Cirrhosis       Metastatic colon cancer with metastasis to intra-abdominal and mediastinal lymph nodes   Endo/Other  negative endocrine ROS    Renal/GU negative Renal ROS  negative genitourinary   Musculoskeletal negative musculoskeletal ROS (+)    Abdominal Normal abdominal exam  (+)   Peds negative pediatric ROS (+)  Hematology  (+) Blood dyscrasia, anemia Chemotherapy-induced thrombocytopenia    Anesthesia Other Findings Past Medical History: No date: COPD (chronic obstructive pulmonary disease) (HCC) No date: Hyperlipidemia Hypomagnesemia  Reproductive/Obstetrics                             Anesthesia Physical Anesthesia Plan  ASA: III  Anesthesia Plan: General   Post-op Pain Management:    Induction: Intravenous  PONV Risk Score and Plan:   Airway Management Planned: Nasal Cannula  Additional Equipment:   Intra-op Plan:   Post-operative Plan:   Informed Consent: I have reviewed the patients History and Physical, chart, labs and discussed the procedure including the risks, benefits and alternatives for the proposed anesthesia with the patient or authorized representative who has indicated his/her  understanding and acceptance.     Dental advisory given  Plan Discussed with: CRNA and Surgeon  Anesthesia Plan Comments:         Anesthesia Quick Evaluation

## 2023-01-12 NOTE — Anesthesia Postprocedure Evaluation (Signed)
Anesthesia Post Note  Patient: Eric Simon  Procedure(s) Performed: ESOPHAGOGASTRODUODENOSCOPY (EGD) WITH PROPOFOL  Patient location during evaluation: PACU Anesthesia Type: General Level of consciousness: awake and alert Pain management: pain level controlled Vital Signs Assessment: post-procedure vital signs reviewed and stable Respiratory status: spontaneous breathing, nonlabored ventilation and respiratory function stable Cardiovascular status: blood pressure returned to baseline and stable Postop Assessment: no apparent nausea or vomiting Anesthetic complications: no   No notable events documented.   Last Vitals:  Vitals:   01/12/23 0942 01/12/23 1046  BP: 127/64   Pulse: 88 79  Resp: 16   Temp:  (!) 35.9 C  SpO2: 98%     Last Pain:  Vitals:   01/12/23 1106  TempSrc:   PainSc: 0-No pain                 [place] Ouch

## 2023-01-12 NOTE — Transfer of Care (Signed)
Immediate Anesthesia Transfer of Care Note  Patient: Eric Simon  Procedure(s) Performed: ESOPHAGOGASTRODUODENOSCOPY (EGD) WITH PROPOFOL  Patient Location: PACU  Anesthesia Type:MAC  Level of Consciousness: awake  Airway & Oxygen Therapy: Patient Spontanous Breathing and Patient connected to face mask oxygen  Post-op Assessment: Report given to RN and Post -op Vital signs reviewed and stable  Post vital signs: Reviewed  Last Vitals:  Vitals Value Taken Time  BP 121/66 01/12/23 1045  Temp 35.9 C 01/12/23 1046  Pulse 81 01/12/23 1048  Resp 38 01/12/23 1048  SpO2 100 % 01/12/23 1048  Vitals shown include unvalidated device data.  Last Pain:  Vitals:   01/12/23 1046  TempSrc: Temporal  PainSc: 0-No pain         Complications: No notable events documented.

## 2023-01-12 NOTE — H&P (Signed)
[name], MD [address], [place], [place], [place], [address] [address], [place], [place], [place], [address] Phone: [phone]  Fax: [phone]  Primary Care Physician:  Jodi Marble, MD   Pre-Procedure History & Physical: HPI:  Eric Simon is a 72 y.o. male is here for an endoscopy    Past Medical History:  Diagnosis Date   Colon cancer ([place])    COPD (chronic obstructive pulmonary disease) ([place])    Hyperlipidemia     Past Surgical History:  Procedure Laterality Date   COLONOSCOPY WITH PROPOFOL N/A 06/26/2019   Procedure: COLONOSCOPY WITH PROPOFOL;  Surgeon: [name], MD;  Location: [facility] ENDOSCOPY;  Service: Gastroenterology;  Laterality: N/A;   FLEXIBLE SIGMOIDOSCOPY N/A 05/07/2021   Procedure: FLEXIBLE SIGMOIDOSCOPY;  Surgeon: [name], MD;  Location: [facility] ENDOSCOPY;  Service: Gastroenterology;  Laterality: N/A;   PORTA CATH INSERTION N/A 06/25/2019   Procedure: PORTA CATH INSERTION;  Surgeon: Algernon Huxley, MD;  Location: [place] CV LAB;  Service: Cardiovascular;  Laterality: N/A;   PORTA CATH INSERTION Left 08/11/2020   Procedure: PORTA CATH INSERTION;  Surgeon: Algernon Huxley, MD;  Location: [place] [facility];  Service: Cardiovascular;  Laterality: Left;   PORTA CATH REMOVAL Right 08/11/2020   Procedure: PORTA CATH REMOVAL;  Surgeon: Algernon Huxley, MD;  Location: [place] [facility];  Service: Cardiovascular;  Laterality: Right;    Prior to Admission medications   Medication Sig Start Date End Date Taking? Authorizing Provider  aspirin EC 81 MG tablet Take 81 mg by mouth daily.   Yes [provider]  cetirizine (ZYRTEC) 10 MG tablet Take 1 tablet (10 mg total) by mouth daily. 12/14/16  Yes Hagler, Jami L, PA-C  azelastine (ASTELIN) 0.1 % nasal spray Place 1 spray into both nostrils daily as needed. 08/23/19   [provider]  butalbital-acetaminophen-caffeine (FIORICET) 50-325-40 MG tablet Take 1 tablet by mouth every  4 (four) hours as needed. 03/25/20   [provider]  capecitabine (XELODA) 500 MG tablet Take 1 tablet (500 mg total) by mouth 2 (two) times daily after a meal. Take for 14 days, then hold for 7 days. Repeat every 21 days. 01/05/23   Sindy Guadeloupe, MD  chlorthalidone (HYGROTON) 25 MG tablet Take 1 tablet by mouth daily. 11/21/19   [provider]  cyclobenzaprine (FLEXERIL) 10 MG tablet TAKE 1 TABLET(10 MG) BY MOUTH THREE TIMES DAILY AS NEEDED FOR MUSCLE SPASMS 07/13/22   Sindy Guadeloupe, MD  DEXILANT 60 MG capsule Take 60 mg by mouth daily. 08/12/21   [provider]  diphenoxylate-atropine (LOMOTIL) 2.5-0.025 MG tablet Take 1 tablet by mouth 4 (four) times daily as needed for diarrhea or loose stools. 08/14/22   Earlie Server, MD  DULoxetine (CYMBALTA) 30 MG capsule Take 1 capsule (30 mg total) by mouth 2 (two) times daily. 05/05/20   Sindy Guadeloupe, MD  fluticasone (FLONASE) 50 MCG/ACT nasal spray Place 2 sprays into both nostrils daily as needed. 11/21/19   [provider]  lidocaine-prilocaine (EMLA) cream Apply 1 Application topically as needed (pt will put 1 inch of cream over port 1 hourfor each treatment, cover plastic over the cream protect clothes). 11/16/22   Sindy Guadeloupe, MD  lisinopril (ZESTRIL) 5 MG tablet Take 5 mg by mouth daily. 08/12/21   [provider]  magic mouthwash (multi-ingredient) oral suspension Swish and spit 5-10 mls by mouth 4 times a day as needed 08/21/21  Nuala Alpha N, RPH-CPP  montelukast (SINGULAIR) 10 MG tablet Take 10 mg by mouth at bedtime.    [provider]  NEXLETOL 180 MG TABS Take 1 tablet by mouth daily. 05/15/21   [provider]  oxyCODONE (OXY IR/ROXICODONE) 5 MG immediate release tablet Take 1 tablet (5 mg total) by mouth every 6 (six) hours as needed for severe pain. 08/01/20   Sindy Guadeloupe, MD  pregabalin (LYRICA) 75 MG capsule Take lyrica 1 tablet in and and then 2 tablets at night 12/08/22   Sindy Guadeloupe, MD  simvastatin (ZOCOR) 20 MG tablet Take 20 mg by mouth daily.     [provider]  SYMBICORT 80-4.5 MCG/ACT inhaler Inhale 2 puffs into the lungs daily. 11/21/19   [provider]  tamsulosin (FLOMAX) 0.4 MG CAPS capsule Take 0.4 mg by mouth.     [provider]  prochlorperazine (COMPAZINE) 10 MG tablet Take 1 tablet (10 mg total) by mouth every 6 (six) hours as needed (Nausea or vomiting). 06/20/19 07/20/21  Sindy Guadeloupe, MD    Allergies as of 12/30/2022   (No Known Allergies)    Family History  Problem Relation Age of Onset   Brain cancer Father     Social History   Socioeconomic History   Marital status: Single    Spouse name: Not on file   Number of children: Not on file   Years of education: Not on file   Highest education level: Not on file  Occupational History   Not on file  Tobacco Use   Smoking status: Every Day    Packs/day: 0.50    Years: 40.00    Total pack years: 20.00    Types: Cigarettes   Smokeless tobacco: Never   Tobacco comments:    smoking 40 years  Vaping Use   Vaping Use: Never used  Substance and Sexual Activity   Alcohol use: No   Drug use: No   Sexual activity: Not Currently  Other Topics Concern   Not on file  Social History Narrative   Not on file   Social Determinants of Health   Financial Resource Strain: Low Risk  (06/12/2019)   Overall Financial Resource Strain (CARDIA)    Difficulty of Paying Living Expenses: Not hard at all  Food Insecurity: No Food Insecurity (06/12/2019)   Hunger Vital Sign    Worried About Running Out of Food in the Last Year: Never true    Ran Out of Food in the Last Year: Never true  Transportation Needs: No Transportation Needs (06/12/2019)   PRAPARE - Hydrologist (Medical): No    Lack of Transportation (Non-Medical): No  Physical Activity: Not on file  Stress: No Stress Concern Present (06/12/2019)   [place]    Feeling of Stress : Not at all  Social Connections: Unknown (06/12/2019)   Social Connection and Isolation Panel [NHANES]    Frequency of Communication with Friends and Family: More than three times a week    Frequency of Social Gatherings with Friends and Family: Not on file    Attends Religious Services: Not on file    Active Member of Clubs or Organizations: Not on file    Attends Archivist Meetings: Not on file    Marital Status: Not on file  Intimate Partner Violence: Not At Risk (06/12/2019)   Humiliation, Afraid, Rape, and Kick questionnaire  Fear of Current or Ex-Partner: No    Emotionally Abused: No    Physically Abused: No    Sexually Abused: No    Review of Systems: See HPI, otherwise negative ROS  Physical Exam: BP 127/64   Pulse 88   Temp (!) 96.7 F (35.9 C) (Temporal)   Resp 16   Ht '5\' 7"'$  (1.702 m)   Wt 69.9 kg   SpO2 98%   BMI 24.12 kg/m  General:   Alert,  pleasant and cooperative in NAD Head:  Normocephalic and atraumatic. Neck:  Supple; no masses or thyromegaly. Lungs:  Clear throughout to auscultation, normal respiratory effort.    Heart:  +S1, +S2, Regular rate and rhythm, No edema. Abdomen:  Soft, nontender and nondistended. Normal bowel sounds, without guarding, and without rebound.   Neurologic:  Alert and  oriented x4;  grossly normal neurologically.  Impression/Plan: Eric Simon is here for an endoscopy  to be performed for  evaluation of esophageal varices    Risks, benefits, limitations, and alternatives regarding endoscopy have been reviewed with the patient.  Questions have been answered.  All parties agreeable.   [name], MD  01/12/2023, 10:25 AM

## 2023-01-12 NOTE — Op Note (Signed)
[facility] Gastroenterology Patient Name: Eric Simon Near Procedure Date: 01/12/2023 10:25 AM MRN: [id number] Account #: [id number] Date of Birth: 04-Jun-1951 Admit Type: Outpatient Age: 72 Room: [facility] ENDO ROOM 3 Gender: Male Note Status: Finalized Instrument Name: Altamese Cabal Endoscope [id number] Procedure:             Upper GI endoscopy Indications:           Cirrhosis rule out esophageal varices Providers:             [name] MD, MD Referring MD:          Venetia Maxon. Elijio Miles, MD (Referring MD) Medicines:             Monitored Anesthesia Care Complications:         No immediate complications. Procedure:             Pre-Anesthesia Assessment:                        - Prior to the procedure, a History and Physical was                         performed, and patient medications, allergies and                         sensitivities were reviewed. The patient's tolerance                         of previous anesthesia was reviewed.                        - The risks and benefits of the procedure and the                         sedation options and risks were discussed with the                         patient. All questions were answered and informed                         consent was obtained.                        - ASA Grade Assessment: II - A patient with mild                         systemic disease.                        After obtaining informed consent, the endoscope was                         passed under direct vision. Throughout the procedure,                         the patient's blood pressure, pulse, and oxygen                         saturations were monitored continuously. The Endoscope  was introduced through the mouth, and advanced to the                         third part of duodenum. The upper GI endoscopy was                         accomplished with ease. The patient tolerated the                         procedure  well. Findings:      The esophagus was normal.      The examined duodenum was normal.      The cardia and gastric fundus were normal on retroflexion.      One non-bleeding superficial gastric ulcer with no stigmata of bleeding       was found in the prepyloric region of the stomach. The lesion was 9 mm       in largest dimension. Biopsies were taken with a cold forceps for       histology. Impression:            - Normal esophagus.                        - Normal examined duodenum.                        - Non-bleeding gastric ulcer with no stigmata of                         bleeding. Biopsied. Recommendation:        - Discharge patient to home (with escort).                        - Resume previous diet.                        - Await pathology results.                        - Repeat upper endoscopy in 3 years for surveillance.                        - Avoid nsaids Procedure Code(s):     --- Professional ---                        [phone], Esophagogastroduodenoscopy, flexible,                         transoral; with biopsy, single or multiple Diagnosis Code(s):     --- Professional ---                        K74.60, Unspecified cirrhosis of liver                        K25.9, Gastric ulcer, unspecified as acute or chronic,                         without hemorrhage or perforation CPT copyright 2022 American Medical Association. All rights reserved. The codes documented in this report are preliminary and upon coder  review may  be revised to meet current compliance requirements. [name], MD [name] MD, MD 01/12/2023 10:39:25 AM This report has been signed electronically. Number of Addenda: 0 Note Initiated On: 01/12/2023 10:25 AM Estimated Blood Loss:  Estimated blood loss: none.      [facility]

## 2023-01-13 ENCOUNTER — Encounter: Payer: Self-pay | Admitting: Gastroenterology

## 2023-01-13 LAB — SURGICAL PATHOLOGY

## 2023-01-14 MED FILL — Dexamethasone Sodium Phosphate Inj 100 MG/10ML: INTRAMUSCULAR | Qty: 1 | Status: AC

## 2023-01-17 ENCOUNTER — Inpatient Hospital Stay: Payer: Medicare HMO | Admitting: Oncology

## 2023-01-17 ENCOUNTER — Other Ambulatory Visit: Payer: Self-pay | Admitting: Oncology

## 2023-01-17 ENCOUNTER — Inpatient Hospital Stay: Payer: Medicare HMO

## 2023-01-17 DIAGNOSIS — C189 Malignant neoplasm of colon, unspecified: Secondary | ICD-10-CM

## 2023-01-18 ENCOUNTER — Other Ambulatory Visit: Payer: Medicare HMO

## 2023-01-18 ENCOUNTER — Ambulatory Visit: Payer: Medicare HMO

## 2023-01-18 ENCOUNTER — Ambulatory Visit: Payer: Medicare HMO | Admitting: Oncology

## 2023-01-19 ENCOUNTER — Ambulatory Visit: Payer: Medicare HMO | Admitting: Radiation Oncology

## 2023-01-21 MED FILL — Dexamethasone Sodium Phosphate Inj 100 MG/10ML: INTRAMUSCULAR | Qty: 1 | Status: AC

## 2023-01-24 ENCOUNTER — Inpatient Hospital Stay: Payer: Medicare HMO

## 2023-01-24 ENCOUNTER — Other Ambulatory Visit: Payer: Self-pay | Admitting: Internal Medicine

## 2023-01-24 ENCOUNTER — Inpatient Hospital Stay (HOSPITAL_BASED_OUTPATIENT_CLINIC_OR_DEPARTMENT_OTHER): Payer: Medicare HMO | Admitting: Oncology

## 2023-01-24 ENCOUNTER — Encounter: Payer: Self-pay | Admitting: Oncology

## 2023-01-24 ENCOUNTER — Inpatient Hospital Stay: Payer: Medicare HMO | Attending: Oncology

## 2023-01-24 VITALS — BP 109/65 | HR 92 | Temp 97.8°F | Resp 20 | Wt 160.6 lb

## 2023-01-24 DIAGNOSIS — Z5111 Encounter for antineoplastic chemotherapy: Secondary | ICD-10-CM | POA: Insufficient documentation

## 2023-01-24 DIAGNOSIS — K7460 Unspecified cirrhosis of liver: Secondary | ICD-10-CM | POA: Insufficient documentation

## 2023-01-24 DIAGNOSIS — C189 Malignant neoplasm of colon, unspecified: Secondary | ICD-10-CM

## 2023-01-24 DIAGNOSIS — Z79899 Other long term (current) drug therapy: Secondary | ICD-10-CM | POA: Diagnosis not present

## 2023-01-24 DIAGNOSIS — C786 Secondary malignant neoplasm of retroperitoneum and peritoneum: Secondary | ICD-10-CM | POA: Insufficient documentation

## 2023-01-24 DIAGNOSIS — I10 Essential (primary) hypertension: Secondary | ICD-10-CM

## 2023-01-24 DIAGNOSIS — C772 Secondary and unspecified malignant neoplasm of intra-abdominal lymph nodes: Secondary | ICD-10-CM

## 2023-01-24 DIAGNOSIS — D696 Thrombocytopenia, unspecified: Secondary | ICD-10-CM | POA: Diagnosis not present

## 2023-01-24 DIAGNOSIS — K259 Gastric ulcer, unspecified as acute or chronic, without hemorrhage or perforation: Secondary | ICD-10-CM | POA: Insufficient documentation

## 2023-01-24 DIAGNOSIS — Z5112 Encounter for antineoplastic immunotherapy: Secondary | ICD-10-CM

## 2023-01-24 LAB — COMPREHENSIVE METABOLIC PANEL
ALT: 15 U/L (ref 0–44)
AST: 26 U/L (ref 15–41)
Albumin: 3.5 g/dL (ref 3.5–5.0)
Alkaline Phosphatase: 118 U/L (ref 38–126)
Anion gap: 9 (ref 5–15)
BUN: 28 mg/dL — ABNORMAL HIGH (ref 8–23)
CO2: 22 mmol/L (ref 22–32)
Calcium: 8.6 mg/dL — ABNORMAL LOW (ref 8.9–10.3)
Chloride: 104 mmol/L (ref 98–111)
Creatinine, Ser: 1.03 mg/dL (ref 0.61–1.24)
GFR, Estimated: >60 mL/min (ref >60)
Glucose, Bld: 126 mg/dL — ABNORMAL HIGH (ref 70–99)
Potassium: 3.6 mmol/L (ref 3.5–5.1)
Sodium: 135 mmol/L (ref 135–145)
Total Bilirubin: 0.6 mg/dL (ref 0.3–1.2)
Total Protein: 7.2 g/dL (ref 6.5–8.1)

## 2023-01-24 LAB — CBC WITH DIFFERENTIAL/PLATELET
Abs Immature Granulocytes: 0.04 10*3/uL (ref 0.00–0.07)
Basophils Absolute: 0 10*3/uL (ref 0.0–0.1)
Basophils Relative: 1 %
Eosinophils Absolute: 0.2 10*3/uL (ref 0.0–0.5)
Eosinophils Relative: 4 %
HCT: 37.6 % — ABNORMAL LOW (ref 39.0–52.0)
Hemoglobin: 13.3 g/dL (ref 13.0–17.0)
Immature Granulocytes: 1 %
Lymphocytes Relative: 16 %
Lymphs Abs: 0.9 10*3/uL (ref 0.7–4.0)
MCH: 33.4 pg (ref 26.0–34.0)
MCHC: 35.4 g/dL (ref 30.0–36.0)
MCV: 94.5 fL (ref 80.0–100.0)
Monocytes Absolute: 0.6 10*3/uL (ref 0.1–1.0)
Monocytes Relative: 11 %
Neutro Abs: 3.9 10*3/uL (ref 1.7–7.7)
Neutrophils Relative %: 67 %
Platelets: 122 10*3/uL — ABNORMAL LOW (ref 150–400)
RBC: 3.98 MIL/uL — ABNORMAL LOW (ref 4.22–5.81)
RDW: 13.9 % (ref 11.5–15.5)
WBC: 5.8 10*3/uL (ref 4.0–10.5)
nRBC: 0 % (ref 0.0–0.2)

## 2023-01-24 LAB — MAGNESIUM: Magnesium: 1.6 mg/dL — ABNORMAL LOW (ref 1.7–2.4)

## 2023-01-24 MED ORDER — SODIUM CHLORIDE 0.9 % IV SOLN
6.0000 mg/kg | Freq: Once | INTRAVENOUS | Status: AC
  Administered 2023-01-24: 400 mg via INTRAVENOUS
  Filled 2023-01-24: qty 20

## 2023-01-24 MED ORDER — SODIUM CHLORIDE 0.9 % IV SOLN
Freq: Once | INTRAVENOUS | Status: AC
  Filled 2023-01-24: qty 250

## 2023-01-24 MED ORDER — SODIUM CHLORIDE 0.9 % IV SOLN
125.0000 mg/m2 | Freq: Once | INTRAVENOUS | Status: AC
  Administered 2023-01-24: 220 mg via INTRAVENOUS
  Filled 2023-01-24: qty 10

## 2023-01-24 MED ORDER — SODIUM CHLORIDE 0.9 % IV SOLN
10.0000 mg | Freq: Once | INTRAVENOUS | Status: AC
  Administered 2023-01-24: 10 mg via INTRAVENOUS
  Filled 2023-01-24: qty 10
  Filled 2023-01-24: qty 1

## 2023-01-24 MED ORDER — SODIUM CHLORIDE 0.9% FLUSH
10.0000 mL | INTRAVENOUS | Status: DC | PRN
  Administered 2023-01-24: 10 mL via INTRAVENOUS
  Filled 2023-01-24: qty 10

## 2023-01-24 MED ORDER — HEPARIN SOD (PORK) LOCK FLUSH 100 UNIT/ML IV SOLN
500.0000 [IU] | Freq: Once | INTRAVENOUS | Status: AC | PRN
  Administered 2023-01-24: 500 [IU]
  Filled 2023-01-24: qty 5

## 2023-01-24 MED ORDER — PALONOSETRON HCL INJECTION 0.25 MG/5ML
0.2500 mg | Freq: Once | INTRAVENOUS | Status: AC
  Administered 2023-01-24: 0.25 mg via INTRAVENOUS
  Filled 2023-01-24: qty 5

## 2023-01-24 MED ORDER — ATROPINE SULFATE 1 MG/ML IV SOLN
0.5000 mg | Freq: Once | INTRAVENOUS | Status: AC | PRN
  Administered 2023-01-24: 0.5 mg via INTRAVENOUS
  Filled 2023-01-24: qty 1

## 2023-01-24 NOTE — Progress Notes (Signed)
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 He currently smokes 0.5ppd. he presented to the ER with symptoms of left sided chest pain and left arm pain. Troponin was negative ekg was unremarkable. Ct chest showed no PE. He was incidentally noted to have mediastinal and retrocrural adenopathy and a 2.1X2.3X4.4 cm retroaortic soft tissue lesion in the posterior left chest. 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  Low-grade activity in the left lateral fifth and sixth ribs associated with nondisplaced fractures likely reflecting healing response problem malignancy.   Patient underwent CT-guided biopsy of the retroperitoneal lymph node pathology showed metastatic adenocarcinoma compatible with colorectal origin.  CK7 negative.  CK20 positive.  CDX 2+.  TTF-1 negative.  PSA negative.  This pattern of immunoreactivity supports the above diagnosis. Patient underwent colonoscopy which showed sigmoid mass that was consistent with adenocarcinoma.  RAS panel testing showed that he was wild-type for both K-ras and BRAF   FOLFOX and bevacizumab chemotherapy started in July 2020.  Subsequently oxaliplatin has been on hold given neuropathy.   Recent scans have shown slow increase in the size of intra-abdominal adenopathy but patient continues to have low-volume disease.plan is to switch him to second line Xeloda irinotecan panitumumab  regimen.  Patient received palliative radiation therapy to the enlarging external iliac lymph nodes  Interval history-patient is tolerating chemotherapy well without any significant side effects.  Denies any nausea vomiting or diarrhea.  He is on oral magnesium tablets for his low magnesium  ECOG PS- 0 Pain scale- 0   Review of systems- Review of Systems  Constitutional:  Negative for chills, fever, malaise/fatigue and weight loss.  HENT:  Negative for congestion, ear discharge and nosebleeds.   Eyes:  Negative for blurred vision.  Respiratory:  Negative for cough, hemoptysis, sputum production, shortness of breath and wheezing.   Cardiovascular:  Negative for chest pain, palpitations, orthopnea and claudication.  Gastrointestinal:  Negative for abdominal pain, blood in stool, constipation, diarrhea, heartburn, melena, nausea and vomiting.  Genitourinary:  Negative for dysuria, flank pain, frequency, hematuria and urgency.  Musculoskeletal:  Negative for back pain, joint pain and myalgias.  Skin:  Negative for rash.  Neurological:  Negative for dizziness, tingling, focal weakness, seizures, weakness and headaches.  Endo/Heme/Allergies:  Does not bruise/bleed easily.  Psychiatric/Behavioral:  Negative for depression and suicidal ideas. The patient does not have insomnia.       No Known Allergies   Past Medical History:  Diagnosis Date   Colon cancer ([place])    COPD (chronic obstructive pulmonary disease) ([place])    Hyperlipidemia      Past Surgical History:  Procedure Laterality Date   COLONOSCOPY WITH PROPOFOL N/A 06/26/2019   Procedure: COLONOSCOPY WITH PROPOFOL;  Surgeon: [name], MD;  Location: [facility] ENDOSCOPY;  Service:  Gastroenterology;  Laterality: N/A;    ESOPHAGOGASTRODUODENOSCOPY (EGD) WITH PROPOFOL N/A 01/12/2023   Procedure: ESOPHAGOGASTRODUODENOSCOPY (EGD) WITH PROPOFOL;  Surgeon: [name], MD;  Location: [facility] ENDOSCOPY;  Service: Gastroenterology;  Laterality: N/A;   FLEXIBLE SIGMOIDOSCOPY N/A 05/07/2021   Procedure: FLEXIBLE SIGMOIDOSCOPY;  Surgeon: [name], MD;  Location: [facility] ENDOSCOPY;  Service: Gastroenterology;  Laterality: N/A;   PORTA CATH INSERTION N/A 06/25/2019   Procedure: PORTA CATH INSERTION;  Surgeon: Algernon Huxley, MD;  Location: [place] CV LAB;  Service: Cardiovascular;  Laterality: N/A;   PORTA CATH INSERTION Left 08/11/2020   Procedure: PORTA CATH INSERTION;  Surgeon: Algernon Huxley, MD;  Location: [place] [facility];  Service: Cardiovascular;  Laterality: Left;   PORTA CATH REMOVAL Right 08/11/2020   Procedure: PORTA CATH REMOVAL;  Surgeon: Algernon Huxley, MD;  Location: [place] [facility] LAB;  Service: Cardiovascular;  Laterality: Right;    Social History   Socioeconomic History   Marital status: Single    Spouse name: Not on file   Number of children: Not on file   Years of education: Not on file   Highest education level: Not on file  Occupational History   Not on file  Tobacco Use   Smoking status: Every Day    Packs/day: 0.50    Years: 40.00    Total pack years: 20.00    Types: Cigarettes   Smokeless tobacco: Never   Tobacco comments:    smoking 40 years  Vaping Use   Vaping Use: Never used  Substance and Sexual Activity   Alcohol use: No   Drug use: No   Sexual activity: Not Currently  Other Topics Concern   Not on file  Social History Narrative   Not on file   Social Determinants of Health   Financial Resource Strain: Low Risk  (06/12/2019)   Overall Financial Resource Strain (CARDIA)    Difficulty of Paying Living Expenses: Not hard at all  Food Insecurity: No Food Insecurity (06/12/2019)   Hunger Vital Sign    Worried About Running Out of Food in the Last Year: Never true    Ran  Out of Food in the Last Year: Never true  Transportation Needs: No Transportation Needs (06/12/2019)   PRAPARE - Hydrologist (Medical): No    Lack of Transportation (Non-Medical): No  Physical Activity: Not on file  Stress: No Stress Concern Present (06/12/2019)   [place]    Feeling of Stress : Not at all  Social Connections: Unknown (06/12/2019)   Social Connection and Isolation Panel [NHANES]    Frequency of Communication with Friends and Family: More than three times a week    Frequency of Social Gatherings with Friends and Family: Not on file    Attends Religious Services: Not on file    Active Member of Clubs or Organizations: Not on file    Attends Archivist Meetings: Not on file    Marital Status: Not on file  Intimate Partner Violence: Not At Risk (06/12/2019)   Humiliation, Afraid, Rape, and Kick questionnaire    Fear of Current or Ex-Partner: No    Emotionally Abused: No    Physically Abused: No    Sexually Abused: No    Family History  Problem Relation Age of Onset   Brain cancer Father      Current Outpatient Medications:    aspirin EC 81 MG tablet, Take 81 mg  by mouth daily., Disp: , Rfl:    azelastine (ASTELIN) 0.1 % nasal spray, Place 1 spray into both nostrils daily as needed., Disp: , Rfl:    butalbital-acetaminophen-caffeine (FIORICET) 50-325-40 MG tablet, Take 1 tablet by mouth every 4 (four) hours as needed., Disp: , Rfl:    capecitabine (XELODA) 500 MG tablet, Take 1 tablet (500 mg total) by mouth 2 (two) times daily after a meal. Take for 14 days, then hold for 7 days. Repeat every 21 days., Disp: 28 tablet, Rfl: 0   cetirizine (ZYRTEC) 10 MG tablet, Take 1 tablet (10 mg total) by mouth daily., Disp: 30 tablet, Rfl: 0   chlorthalidone (HYGROTON) 25 MG tablet, Take 1 tablet by mouth daily., Disp: , Rfl:    cyclobenzaprine (FLEXERIL) 10 MG tablet, TAKE 1  TABLET(10 MG) BY MOUTH THREE TIMES DAILY AS NEEDED FOR MUSCLE SPASMS, Disp: 42 tablet, Rfl: 0   DEXILANT 60 MG capsule, Take 60 mg by mouth daily., Disp: , Rfl:    diphenoxylate-atropine (LOMOTIL) 2.5-0.025 MG tablet, Take 1 tablet by mouth 4 (four) times daily as needed for diarrhea or loose stools., Disp: 30 tablet, Rfl: 0   DULoxetine (CYMBALTA) 30 MG capsule, Take 1 capsule (30 mg total) by mouth 2 (two) times daily., Disp: 60 capsule, Rfl: 1   fluticasone (FLONASE) 50 MCG/ACT nasal spray, Place 2 sprays into both nostrils daily as needed., Disp: , Rfl:    lidocaine-prilocaine (EMLA) cream, Apply 1 Application topically as needed (pt will put 1 inch of cream over port 1 hourfor each treatment, cover plastic over the cream protect clothes)., Disp: 30 g, Rfl: 3   lisinopril (ZESTRIL) 5 MG tablet, Take 5 mg by mouth daily., Disp: , Rfl:    magic mouthwash (multi-ingredient) oral suspension, Swish and spit 5-10 mls by mouth 4 times a day as needed, Disp: 480 mL, Rfl: 3   montelukast (SINGULAIR) 10 MG tablet, Take 10 mg by mouth at bedtime., Disp: , Rfl:    NEXLETOL 180 MG TABS, Take 1 tablet by mouth daily., Disp: , Rfl:    oxyCODONE (OXY IR/ROXICODONE) 5 MG immediate release tablet, Take 1 tablet (5 mg total) by mouth every 6 (six) hours as needed for severe pain., Disp: 30 tablet, Rfl: 0   pregabalin (LYRICA) 75 MG capsule, Take lyrica 1 tablet in and and then 2 tablets at night, Disp: 90 capsule, Rfl: 2   simvastatin (ZOCOR) 20 MG tablet, Take 20 mg by mouth daily. , Disp: , Rfl:    SYMBICORT 80-4.5 MCG/ACT inhaler, Inhale 2 puffs into the lungs daily., Disp: , Rfl:    tamsulosin (FLOMAX) 0.4 MG CAPS capsule, Take 0.4 mg by mouth. , Disp: , Rfl:  No current facility-administered medications for this visit.  Facility-Administered Medications Ordered in Other Visits:    atropine 1 MG/ML injection, , , ,    heparin lock flush 100 UNIT/ML injection, , , ,    heparin lock flush 100 unit/mL, 500  Units, Intracatheter, Once PRN, Sindy Guadeloupe, MD   irinotecan (CAMPTOSAR) 220 mg in sodium chloride 0.9 % 500 mL chemo infusion, 125 mg/m2 (Treatment Plan Recorded), Intravenous, Once, Sindy Guadeloupe, MD, Last Rate: 341 mL/hr at 01/24/23 1125, 220 mg at 01/24/23 1125   sodium chloride flush (NS) 0.9 % injection 10 mL, 10 mL, Intravenous, PRN, Sindy Guadeloupe, MD, 10 mL at 01/24/23 0808  Physical exam:  Vitals:   01/24/23 0846  BP: 109/65  Pulse: 92  Resp: 20  Temp: 97.8 F (36.6 C)  SpO2: 98%  Weight: 160 lb 9.6 oz (72.8 kg)   Physical Exam Cardiovascular:     Rate and Rhythm: Normal rate and regular rhythm.     Heart sounds: Normal heart sounds.  Pulmonary:     Effort: Pulmonary effort is normal.     Breath sounds: Normal breath sounds.  Abdominal:     General: Bowel sounds are normal.     Palpations: Abdomen is soft.  Skin:    General: Skin is warm and dry.  Neurological:     Mental Status: He is alert and oriented to person, place, and time.         Latest Ref Rng & Units 01/24/2023    8:08 AM  CMP  Glucose 70 - 99 mg/dL 126   BUN 8 - 23 mg/dL 28   Creatinine 0.61 - 1.24 mg/dL 1.03   Sodium 135 - 145 mmol/L 135   Potassium 3.5 - 5.1 mmol/L 3.6   Chloride 98 - 111 mmol/L 104   CO2 22 - 32 mmol/L 22   Calcium 8.9 - 10.3 mg/dL 8.6   Total Protein 6.5 - 8.1 g/dL 7.2   Total Bilirubin 0.3 - 1.2 mg/dL 0.6   Alkaline Phos 38 - 126 U/L 118   AST 15 - 41 U/L 26   ALT 0 - 44 U/L 15       Latest Ref Rng & Units 01/24/2023    8:08 AM  CBC  WBC 4.0 - 10.5 K/uL 5.8   Hemoglobin 13.0 - 17.0 g/dL 13.3   Hematocrit 39.0 - 52.0 % 37.6   Platelets 150 - 400 K/uL 122     No images are attached to the encounter.  No results found.   Assessment and plan- Patient is a 72 y.o. male with metastatic colon cancer with lymph node metastases.  He is here for on treatment assessment prior to cycle 20 of panitumumab irinotecan chemotherapy  Counts okay to proceed with cycle 20  of panitumumab irinotecan chemotherapy today.  He is currently on Xeloda but the timing is of with the start of his panitumumab and irinotecan.  Next next week would be his week off.  I am therefore seeing him back in 2 weeks for cycle 21 so his Xeloda can coincide with the start of next cycle of panitumumab and irinotecan.  CEA from today is pending.  Will plan to get repeat CT chest abdomen and pelvis with contrast in 2 weeks time.  Mild thrombocytopenia: Possibly secondary to chemotherapy.  He also has some baseline cirrhosis that could be contributory as well.  He did see Dr. Clementeen Graham recently and underwent EGD which did not show any varices.  He was found to have a nonbleeding gastric ulcer which was biopsied and was negative for malignancy.   Visit Diagnosis 1. Encounter for antineoplastic chemotherapy   2. 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 ([place])   3. Encounter for monoclonal antibody treatment for malignancy   4. High risk medication use      Dr. Randa Evens, MD, MPH [facility] [id number] 01/24/2023 12:47 PM

## 2023-01-25 LAB — CEA: CEA: 5.5 ng/mL — ABNORMAL HIGH (ref 0.0–4.7)

## 2023-01-27 ENCOUNTER — Other Ambulatory Visit (HOSPITAL_COMMUNITY): Payer: Self-pay

## 2023-01-27 ENCOUNTER — Other Ambulatory Visit: Payer: Self-pay | Admitting: Oncology

## 2023-01-27 ENCOUNTER — Other Ambulatory Visit: Payer: Self-pay

## 2023-01-27 DIAGNOSIS — C189 Malignant neoplasm of colon, unspecified: Secondary | ICD-10-CM

## 2023-01-27 MED ORDER — CAPECITABINE 500 MG PO TABS
500.0000 mg | ORAL_TABLET | Freq: Two times a day (BID) | ORAL | 0 refills | Status: DC
  Filled 2023-01-27: qty 28, 21d supply, fill #0

## 2023-01-27 NOTE — Telephone Encounter (Signed)
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   0 Result Notes           Component Ref Range & Units 3 d ago (01/24/23) 3 wk ago (12/31/22) 1 mo ago (12/27/22) 1 mo ago (12/08/22) 2 mo ago (11/16/22) 3 mo ago (10/25/22) 3 mo ago (10/11/22)  WBC 4.0 - 10.5 K/uL 5.8 4.5 4.4 4.8 5.5 5.9 4.3  RBC 4.22 - 5.81 MIL/uL 3.98 Low  3.86 Low  3.86 Low  3.75 Low  3.83 Low  3.90 Low  3.77 Low   Hemoglobin 13.0 - 17.0 g/dL 13.3 12.8 Low  12.9 Low  12.7 Low  13.1 13.3 12.7 Low   HCT 39.0 - 52.0 % 37.6 Low  35.9 Low  37.4 Low  35.9 Low  36.9 Low  37.9 Low  36.3 Low   MCV 80.0 - 100.0 fL 94.5 93.0 96.9 95.7 96.3 97.2 96.3  MCH 26.0 - 34.0 pg 33.4 33.2 33.4 33.9 34.2 High  34.1 High  33.7  MCHC 30.0 - 36.0 g/dL 35.4 35.7 34.5 35.4 35.5 35.1 35.0  RDW 11.5 - 15.5 % 13.9 14.0 14.6 14.4 14.7 15.0 15.7 High   Platelets 150 - 400 K/uL 122 Low  108 Low  103 Low  113 Low  CM 104 Low  126 Low  111 Low  CM  nRBC 0.0 - 0.2 % 0.0 0.0 0.0 0.0 0.0 0.0 0.0  Neutrophils Relative % % 67 60 59 64 65 69 58  Neutro Abs 1.7 - 7.7 K/uL 3.9 2.7 2.7 3.1 3.6 4.1 2.5  Lymphocytes Relative % 16 25 23 18 18 17 21  $ Lymphs Abs 0.7 - 4.0 K/uL 0.9 1.1 1.0 0.9 1.0 1.0 0.9  Monocytes Relative % 11 8 11 10 11 9 14  $ Monocytes Absolute 0.1 - 1.0 K/uL 0.6 0.3 0.5 0.5 0.6 0.5 0.6  Eosinophils Relative % 4 4 5 6 4 4 5  $ Eosinophils Absolute 0.0 - 0.5 K/uL 0.2 0.2 0.2 0.3 0.2 0.2 0.2  Basophils Relative % 1 1 1 1 1 1 1  $ Basophils Absolute 0.0 - 0.1 K/uL 0.0 0.0 0.0 0.0 0.0 0.0 0.1  Immature Granulocytes % 1 2 1 1 1 $ 0 1  Abs Immature Granulocytes 0.00 - 0.07 K/uL 0.04 0.09 High  CM 0.02 CM 0.04 CM 0.04 CM 0.02 CM 0.03 CM  Comment: Performed at [facility], [address] [place], [place] [address]  Resulting Agency  [facility] CL[facility] [place] CL[facility] LAB [place] CL[facility] [place] [facility] [place] [facility]  LAB [place] [facility] [place] [facility]         Specimen Collected: 01/24/23 08:08 Last Resulted: 01/24/23 08:50      Lab Flowsheet      Order Details      View Encounter      Lab and Collection Details      Routing      Result History    View All Conversations on this Encounter      CM=Additional comments      Result Care Coordination   Patient Communication   Add Comments   Seen Back to Top      Other Results from 01/24/2023   Contains abnormal data CEA Order: [id number] Status: Final result      Visible to  patient: Yes (seen)      Next appt: 02/01/2023 at 11:30 AM in Radiology ([facility])      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    0 Result Notes             Component Ref Range & Units 3 d ago (01/24/23) 2 mo ago (11/16/22) 3 mo ago (10/11/22) 4 mo ago (09/20/22) 5 mo ago (08/30/22) 5 mo ago (08/09/22) 7 mo ago (06/29/22)  CEA 0.0 - 4.7 ng/mL 5.5 High  4.4 CM 4.4 CM 4.9 High  CM 5.8 High  CM 15.9 High  CM 13.0 High  CM  Comment: (NOTE)                             Nonsmokers          <3.9                             Smokers             <5.6 [employer] Electrochemiluminescence Immunoassay (ECLIA) Values obtained with different assay methods or kits cannot be used interchangeably.  Results cannot be interpreted as absolute evidence of the presence or absence of malignant disease. Performed At: [facility] [address] [place], [place] [id number] Rush Farmer MD [id number]  Resulting Agency  [place] [facility] [place] [facility] [place] [facility] [place] [facility] [place] [facility] [place] [facility] [place] [facility]         Specimen Collected: 01/24/23 09:31 Last Resulted: 01/25/23 05:37      Lab Flowsheet       Order Details       View Encounter       Lab and Collection Details       Routing       Result History     View All Conversations on this Encounter      CM=Additional comments      Result Care Coordination   Patient Communication   Add  Comments   Seen Back to Top         Contains abnormal data Magnesium Order: [id number] Status: Final result      Visible to patient: Yes (seen)      Next appt: 02/01/2023 at 11:30 AM in Radiology ([facility])      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    0 Result Notes             Component Ref Range & Units 3 d ago (01/24/23) 3 wk ago (12/31/22) 1 mo ago (12/27/22) 1 mo ago (12/08/22) 2 mo ago (11/16/22) 3 mo ago (10/25/22) 3 mo ago (10/11/22)  Magnesium 1.7 - 2.4 mg/dL 1.6 Low  1.1 Low  CM 1.4 Low  CM 1.7 CM 1.7 CM 2.0 CM 2.0 CM  Comment: Performed at [facility], [address] [place],  [address]  Resulting Agency  [facility] CL[facility] LAB [place] CL[facility] LAB [place] CL[facility] [place] [facility] [place] [facility] [place] [facility] LAB         Specimen Collected: 01/24/23 08:08 Last Resulted: 01/24/23 08:47      Lab Flowsheet       Order Details       View Encounter       Lab and Collection Details       Routing       Result History  View All Conversations on this Encounter      CM=Additional comments      Result Care Coordination   Patient Communication   Add Comments   Seen Back to Top         Contains abnormal data Comprehensive metabolic panel Order: [id number] Status: Final result      Visible to patient: Yes (seen)      Next appt: 02/01/2023 at 11:30 AM in Radiology ([facility])      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    0 Result Notes             Component Ref Range & Units 3 d ago (01/24/23) 3 wk ago (12/31/22) 1 mo ago (12/27/22) 1 mo ago (12/08/22) 2 mo ago (11/16/22) 3 mo ago (10/25/22) 3 mo ago (10/11/22)  Sodium 135 - 145 mmol/L 135 137 138 137 136 135 133 Low   Potassium 3.5 - 5.1 mmol/L 3.6 3.9 3.7 4.2 3.9 3.9 3.9  Chloride 98 - 111 mmol/L 104 104 104 107 104 106 107  CO2 22 - 32 mmol/L 22 23 23 24 24 21 $ Low  22  Glucose, Bld 70 - 99 mg/dL 126 High  94 CM 84 CM 96 CM 103 High  CM 99 CM 100 High  CM  Comment: Glucose  reference range applies only to samples taken after fasting for at least 8 hours.  BUN 8 - 23 mg/dL 28 High  24 High  19 34 High  30 High  34 High  29 High   Creatinine, Ser 0.61 - 1.24 mg/dL 1.03 0.98 0.93 1.01 1.16 1.05 1.02  Calcium 8.9 - 10.3 mg/dL 8.6 Low  7.5 Low  8.5 Low  8.6 Low  8.9 8.8 Low  8.5 Low   Total Protein 6.5 - 8.1 g/dL 7.2 6.8 6.6 7.1 6.8 7.4 7.1  Albumin 3.5 - 5.0 g/dL 3.5 3.6 3.6 3.8 3.7 3.9 3.7  AST 15 - 41 U/L 26 28 33 26 25 29 30  $ ALT 0 - 44 U/L 15 26 21 17 18 20 18  $ Alkaline Phosphatase 38 - 126 U/L 118 118 121 105 98 121 122  Total Bilirubin 0.3 - 1.2 mg/dL 0.6 0.5 0.4 0.8 0.8 0.8 0.7  GFR, Estimated >60 mL/min >60 >60 CM >60 CM >60 CM >60 CM >60 CM >60 CM  Comment: (NOTE) Calculated using the CKD-EPI Creatinine Equation (2021)  Anion gap 5 - 15 9 10 $ CM 11 CM 6 CM 8 CM 8 CM 4 Low  CM  Comment: Performed at [facility], [address] [place],  [address]  Resulting Agency  [facility] CL[facility] LAB [place] CL[facility] LAB [place] CL[facility] LAB [place] CL[facility] LAB [place] [facility] [place] [facility] [place] [facility]         Specimen Collected: 01/24/23 08:08 Last Resulted: 01/24/23 08:47

## 2023-01-29 ENCOUNTER — Other Ambulatory Visit: Payer: Self-pay | Admitting: Internal Medicine

## 2023-01-31 ENCOUNTER — Other Ambulatory Visit: Payer: Self-pay

## 2023-02-01 ENCOUNTER — Ambulatory Visit
Admission: RE | Admit: 2023-02-01 | Discharge: 2023-02-01 | Disposition: A | Discharge status: Home / Self Care | Payer: Medicare HMO | Source: Ambulatory Visit | Attending: Oncology | Admitting: Oncology

## 2023-02-01 ENCOUNTER — Other Ambulatory Visit: Payer: Self-pay

## 2023-02-01 DIAGNOSIS — C189 Malignant neoplasm of colon, unspecified: Secondary | ICD-10-CM | POA: Diagnosis not present

## 2023-02-01 DIAGNOSIS — C772 Secondary and unspecified malignant neoplasm of intra-abdominal lymph nodes: Secondary | ICD-10-CM | POA: Insufficient documentation

## 2023-02-01 DIAGNOSIS — J439 Emphysema, unspecified: Secondary | ICD-10-CM | POA: Diagnosis not present

## 2023-02-01 DIAGNOSIS — K5730 Diverticulosis of large intestine without perforation or abscess without bleeding: Secondary | ICD-10-CM | POA: Diagnosis not present

## 2023-02-01 MED ORDER — IOHEXOL 300 MG/ML  SOLN
100.0000 mL | Freq: Once | INTRAMUSCULAR | Status: AC | PRN
  Administered 2023-02-01: 100 mL via INTRAVENOUS

## 2023-02-03 ENCOUNTER — Encounter: Payer: Self-pay | Admitting: Oncology

## 2023-02-04 ENCOUNTER — Encounter: Payer: Self-pay | Admitting: Internal Medicine

## 2023-02-04 ENCOUNTER — Encounter: Payer: Self-pay | Admitting: Oncology

## 2023-02-04 ENCOUNTER — Ambulatory Visit (INDEPENDENT_AMBULATORY_CARE_PROVIDER_SITE_OTHER): Payer: Medicare HMO | Admitting: Internal Medicine

## 2023-02-04 VITALS — BP 122/58 | HR 93 | Ht 67.0 in | Wt 160.0 lb

## 2023-02-04 DIAGNOSIS — C772 Secondary and unspecified malignant neoplasm of intra-abdominal lymph nodes: Secondary | ICD-10-CM | POA: Diagnosis not present

## 2023-02-04 DIAGNOSIS — C189 Malignant neoplasm of colon, unspecified: Secondary | ICD-10-CM

## 2023-02-04 DIAGNOSIS — Z1331 Encounter for screening for depression: Secondary | ICD-10-CM

## 2023-02-04 DIAGNOSIS — E782 Mixed hyperlipidemia: Secondary | ICD-10-CM

## 2023-02-04 DIAGNOSIS — S2341XA Sprain of ribs, initial encounter: Secondary | ICD-10-CM

## 2023-02-04 DIAGNOSIS — G622 Polyneuropathy due to other toxic agents: Secondary | ICD-10-CM

## 2023-02-04 MED ORDER — NEXLETOL 180 MG PO TABS: 1.0000 | ORAL_TABLET | Freq: Every day | ORAL | 1 refills | Status: AC

## 2023-02-04 MED FILL — Dexamethasone Sodium Phosphate Inj 100 MG/10ML: INTRAMUSCULAR | Qty: 1 | Status: AC

## 2023-02-04 NOTE — Progress Notes (Signed)
Established Patient Office Visit  Subjective:  Patient ID: Eric Simon, male    DOB: [birth year]  Age: 72 y.o. MRN: [id number]  No chief complaint on file.   No new complaints, here AWV and refer to scanned documents.  Failed to have previsit labs done. Still c/o headaches unrelieved by Butalbital. Recent fall and c/o pain in his right side afterwards, failed to seek medical attention.     Past Medical History:  Diagnosis Date   Colon cancer ([place])    COPD (chronic obstructive pulmonary disease) ([place])    Hyperlipidemia     Social History   Socioeconomic History   Marital status: Single    Spouse name: Not on file   Number of children: Not on file   Years of education: Not on file   Highest education level: Not on file  Occupational History   Not on file  Tobacco Use   Smoking status: Every Day    Packs/day: 0.50    Years: 40.00    Total pack years: 20.00    Types: Cigarettes   Smokeless tobacco: Never   Tobacco comments:    smoking 40 years  Vaping Use   Vaping Use: Never used  Substance and Sexual Activity   Alcohol use: No   Drug use: No   Sexual activity: Not Currently  Other Topics Concern   Not on file  Social History Narrative   Not on file   Social Determinants of Health   Financial Resource Strain: Low Risk  (06/12/2019)   Overall Financial Resource Strain (CARDIA)    Difficulty of Paying Living Expenses: Not hard at all  Food Insecurity: No Food Insecurity (06/12/2019)   Hunger Vital Sign    Worried About Running Out of Food in the Last Year: Never true    Ran Out of Food in the Last Year: Never true  Transportation Needs: No Transportation Needs (06/12/2019)   PRAPARE - Hydrologist (Medical): No    Lack of Transportation (Non-Medical): No  Physical Activity: Not on file  Stress: No Stress Concern Present (06/12/2019)   [place]    Feeling of  Stress : Not at all  Social Connections: Unknown (06/12/2019)   Social Connection and Isolation Panel [NHANES]    Frequency of Communication with Friends and Family: More than three times a week    Frequency of Social Gatherings with Friends and Family: Not on file    Attends Religious Services: Not on file    Active Member of Clubs or Organizations: Not on file    Attends Archivist Meetings: Not on file    Marital Status: Not on file  Intimate Partner Violence: Not At Risk (06/12/2019)   Humiliation, Afraid, Rape, and Kick questionnaire    Fear of Current or Ex-Partner: No    Emotionally Abused: No    Physically Abused: No    Sexually Abused: No    Family History  Problem Relation Age of Onset   Brain cancer Father     No Known Allergies  Review of Systems  Constitutional: Negative.   HENT: Negative.    Eyes: Negative.   Respiratory: Negative.    Cardiovascular: Negative.   Gastrointestinal: Negative.   Genitourinary: Negative.   Skin: Negative.   Neurological:        Numbness in both feet  Endo/Heme/Allergies: Negative.        Objective:   BP [name])  122/58   Pulse 93   Ht '5\' 7"'$  (1.702 m)   Wt 160 lb (72.6 kg)   SpO2 91%   BMI 25.06 kg/m   Vitals:   02/04/23 1051  BP: (!) 122/58  Pulse: 93  Height: '5\' 7"'$  (1.702 m)  Weight: 160 lb (72.6 kg)  SpO2: 91%  BMI (Calculated): 25.05    Physical Exam Vitals reviewed.  Constitutional:      Appearance: Normal appearance.  HENT:     Head: Normocephalic.     Left Ear: There is no impacted cerumen.     Nose: Nose normal.     Mouth/Throat:     Mouth: Mucous membranes are moist.     Pharynx: No posterior oropharyngeal erythema.  Eyes:     Extraocular Movements: Extraocular movements intact.     Pupils: Pupils are equal, round, and reactive to light.  Cardiovascular:     Rate and Rhythm: Regular rhythm.     Chest Wall: PMI is not displaced.     Pulses: Normal pulses.     Heart sounds: Normal heart  sounds. No murmur heard. Pulmonary:     Effort: Pulmonary effort is normal.     Breath sounds: Normal air entry. No rhonchi or rales.  Chest:     Chest wall: Tenderness present.  Abdominal:     General: Abdomen is flat. Bowel sounds are normal. There is no distension.     Palpations: Abdomen is soft. There is no hepatomegaly, splenomegaly or mass.     Tenderness: There is abdominal tenderness in the right lower quadrant. There is no guarding.     Comments: No rigidity  Musculoskeletal:        General: Normal range of motion.     Cervical back: Normal range of motion and neck supple.     Right lower leg: No edema.     Left lower leg: No edema.  Skin:    General: Skin is warm and dry.  Neurological:     General: No focal deficit present.     Mental Status: He is alert and oriented to person, place, and time.     Cranial Nerves: No cranial nerve deficit.     Motor: No weakness.  Psychiatric:        Mood and Affect: Mood normal.        Behavior: Behavior normal.      No results found for any visits on 02/04/23.  Recent Results (from the past 2160 hour(Ezra Denne))  Magnesium     Status: None   Collection Time: 11/16/22  8:09 AM  Result Value Ref Range   Magnesium 1.7 1.7 - 2.4 mg/dL    Comment: Performed at [facility], [address] [place], [place] [address]  Comprehensive metabolic panel     Status: Abnormal   Collection Time: 11/16/22  8:09 AM  Result Value Ref Range   Sodium 136 135 - 145 mmol/L   Potassium 3.9 3.5 - 5.1 mmol/L   Chloride 104 98 - 111 mmol/L   CO2 24 22 - 32 mmol/L   Glucose, Bld 103 (H) 70 - 99 mg/dL    Comment: Glucose reference range applies only to samples taken after fasting for at least 8 hours.   BUN 30 (H) 8 - 23 mg/dL   Creatinine, Ser 1.16 0.61 - 1.24 mg/dL   Calcium 8.9 8.9 - 10.3 mg/dL   Total Protein 6.8 6.5 - 8.1 g/dL   Albumin 3.7 3.5 - 5.0 g/dL  AST 25 15 - 41 U/L   ALT 18 0 - 44 U/L   Alkaline Phosphatase 98 38 - 126 U/L    Total Bilirubin 0.8 0.3 - 1.2 mg/dL   GFR, Estimated >60 >60 mL/min    Comment: (NOTE) Calculated using the CKD-EPI Creatinine Equation (2021)    Anion gap 8 5 - 15    Comment: Performed at [facility], [address] [place], [place] [address]  CBC with Differential     Status: Abnormal   Collection Time: 11/16/22  8:09 AM  Result Value Ref Range   WBC 5.5 4.0 - 10.5 K/uL   RBC 3.83 (L) 4.22 - 5.81 MIL/uL   Hemoglobin 13.1 13.0 - 17.0 g/dL   HCT 36.9 (L) 39.0 - 52.0 %   MCV 96.3 80.0 - 100.0 fL   MCH 34.2 (H) 26.0 - 34.0 pg   MCHC 35.5 30.0 - 36.0 g/dL   RDW 14.7 11.5 - 15.5 %   Platelets 104 (L) 150 - 400 K/uL   nRBC 0.0 0.0 - 0.2 %   Neutrophils Relative % 65 %   Neutro Abs 3.6 1.7 - 7.7 K/uL   Lymphocytes Relative 18 %   Lymphs Abs 1.0 0.7 - 4.0 K/uL   Monocytes Relative 11 %   Monocytes Absolute 0.6 0.1 - 1.0 K/uL   Eosinophils Relative 4 %   Eosinophils Absolute 0.2 0.0 - 0.5 K/uL   Basophils Relative 1 %   Basophils Absolute 0.0 0.0 - 0.1 K/uL   Immature Granulocytes 1 %   Abs Immature Granulocytes 0.04 0.00 - 0.07 K/uL    Comment: Performed at [facility], [address] [place], [place] [address]  CEA     Status: None   Collection Time: 11/16/22  8:09 AM  Result Value Ref Range   CEA 4.4 0.0 - 4.7 ng/mL    Comment: (NOTE)                             Nonsmokers          <3.9                             Smokers             <5.6 Roche [employer]ostics Electrochemiluminescence Immunoassay (ECLIA) Values obtained with different assay methods or kits cannot be used interchangeably.  Results cannot be interpreted as absolute evidence of the presence or absence of malignant disease. Performed At: [facility] [place], [place] [id number] Rush Farmer MD [id number]   Magnesium     Status: None   Collection Time: 12/08/22  8:13 AM  Result Value Ref Range   Magnesium 1.7 1.7 - 2.4 mg/dL    Comment: Performed at [facility], [address] [place], [place] [address]  Comprehensive metabolic panel     Status: Abnormal   Collection Time: 12/08/22  8:13 AM  Result Value Ref Range   Sodium 137 135 - 145 mmol/L   Potassium 4.2 3.5 - 5.1 mmol/L   Chloride 107 98 - 111 mmol/L   CO2 24 22 - 32 mmol/L   Glucose, Bld 96 70 - 99 mg/dL    Comment: Glucose reference range applies only to samples taken after fasting for at least 8 hours.   BUN 34 (H) 8 - 23 mg/dL   Creatinine, Ser 1.01 0.61 -  1.24 mg/dL   Calcium 8.6 (L) 8.9 - 10.3 mg/dL   Total Protein 7.1 6.5 - 8.1 g/dL   Albumin 3.8 3.5 - 5.0 g/dL   AST 26 15 - 41 U/L   ALT 17 0 - 44 U/L   Alkaline Phosphatase 105 38 - 126 U/L   Total Bilirubin 0.8 0.3 - 1.2 mg/dL   GFR, Estimated >60 >60 mL/min    Comment: (NOTE) Calculated using the CKD-EPI Creatinine Equation (2021)    Anion gap 6 5 - 15    Comment: Performed at [facility], [address] [place], [place] [address]  CBC with Differential     Status: Abnormal   Collection Time: 12/08/22  8:13 AM  Result Value Ref Range   WBC 4.8 4.0 - 10.5 K/uL   RBC 3.75 (L) 4.22 - 5.81 MIL/uL   Hemoglobin 12.7 (L) 13.0 - 17.0 g/dL   HCT 35.9 (L) 39.0 - 52.0 %   MCV 95.7 80.0 - 100.0 fL   MCH 33.9 26.0 - 34.0 pg   MCHC 35.4 30.0 - 36.0 g/dL   RDW 14.4 11.5 - 15.5 %   Platelets 113 (L) 150 - 400 K/uL    Comment: SPECIMEN CHECKED FOR CLOTS   nRBC 0.0 0.0 - 0.2 %   Neutrophils Relative % 64 %   Neutro Abs 3.1 1.7 - 7.7 K/uL   Lymphocytes Relative 18 %   Lymphs Abs 0.9 0.7 - 4.0 K/uL   Monocytes Relative 10 %   Monocytes Absolute 0.5 0.1 - 1.0 K/uL   Eosinophils Relative 6 %   Eosinophils Absolute 0.3 0.0 - 0.5 K/uL   Basophils Relative 1 %   Basophils Absolute 0.0 0.0 - 0.1 K/uL   Immature Granulocytes 1 %   Abs Immature Granulocytes 0.04 0.00 - 0.07 K/uL    Comment: Performed at [facility], [address] [place], [place] [address]  Magnesium     Status: Abnormal   Collection Time:  12/27/22  8:32 AM  Result Value Ref Range   Magnesium 1.4 (L) 1.7 - 2.4 mg/dL    Comment: Performed at [facility], [address] [place], [place] [address]  Comprehensive metabolic panel     Status: Abnormal   Collection Time: 12/27/22  8:32 AM  Result Value Ref Range   Sodium 138 135 - 145 mmol/L   Potassium 3.7 3.5 - 5.1 mmol/L   Chloride 104 98 - 111 mmol/L   CO2 23 22 - 32 mmol/L   Glucose, Bld 84 70 - 99 mg/dL    Comment: Glucose reference range applies only to samples taken after fasting for at least 8 hours.   BUN 19 8 - 23 mg/dL   Creatinine, Ser 0.93 0.61 - 1.24 mg/dL   Calcium 8.5 (L) 8.9 - 10.3 mg/dL   Total Protein 6.6 6.5 - 8.1 g/dL   Albumin 3.6 3.5 - 5.0 g/dL   AST 33 15 - 41 U/L   ALT 21 0 - 44 U/L   Alkaline Phosphatase 121 38 - 126 U/L   Total Bilirubin 0.4 0.3 - 1.2 mg/dL   GFR, Estimated >60 >60 mL/min    Comment: (NOTE) Calculated using the CKD-EPI Creatinine Equation (2021)    Anion gap 11 5 - 15    Comment: Performed at [facility], [address] [place], [place] [address]  CBC with Differential     Status: Abnormal   Collection Time: 12/27/22  8:32 AM  Result  Value Ref Range   WBC 4.4 4.0 - 10.5 K/uL   RBC 3.86 (L) 4.22 - 5.81 MIL/uL   Hemoglobin 12.9 (L) 13.0 - 17.0 g/dL   HCT 37.4 (L) 39.0 - 52.0 %   MCV 96.9 80.0 - 100.0 fL   MCH 33.4 26.0 - 34.0 pg   MCHC 34.5 30.0 - 36.0 g/dL   RDW 14.6 11.5 - 15.5 %   Platelets 103 (L) 150 - 400 K/uL   nRBC 0.0 0.0 - 0.2 %   Neutrophils Relative % 59 %   Neutro Abs 2.7 1.7 - 7.7 K/uL   Lymphocytes Relative 23 %   Lymphs Abs 1.0 0.7 - 4.0 K/uL   Monocytes Relative 11 %   Monocytes Absolute 0.5 0.1 - 1.0 K/uL   Eosinophils Relative 5 %   Eosinophils Absolute 0.2 0.0 - 0.5 K/uL   Basophils Relative 1 %   Basophils Absolute 0.0 0.0 - 0.1 K/uL   Immature Granulocytes 1 %   Abs Immature Granulocytes 0.02 0.00 - 0.07 K/uL    Comment: Performed at [facility], [address] [place], [place] [address]  Comprehensive metabolic panel     Status: Abnormal   Collection Time: 12/31/22  1:00 PM  Result Value Ref Range   Sodium 137 135 - 145 mmol/L   Potassium 3.9 3.5 - 5.1 mmol/L   Chloride 104 98 - 111 mmol/L   CO2 23 22 - 32 mmol/L   Glucose, Bld 94 70 - 99 mg/dL    Comment: Glucose reference range applies only to samples taken after fasting for at least 8 hours.   BUN 24 (H) 8 - 23 mg/dL   Creatinine, Ser 0.98 0.61 - 1.24 mg/dL   Calcium 7.5 (L) 8.9 - 10.3 mg/dL   Total Protein 6.8 6.5 - 8.1 g/dL   Albumin 3.6 3.5 - 5.0 g/dL   AST 28 15 - 41 U/L   ALT 26 0 - 44 U/L   Alkaline Phosphatase 118 38 - 126 U/L   Total Bilirubin 0.5 0.3 - 1.2 mg/dL   GFR, Estimated >60 >60 mL/min    Comment: (NOTE) Calculated using the CKD-EPI Creatinine Equation (2021)    Anion gap 10 5 - 15    Comment: Performed at [facility], [address] [place], [place] [address]  CBC with Differential/Platelet     Status: Abnormal   Collection Time: 12/31/22  1:00 PM  Result Value Ref Range   WBC 4.5 4.0 - 10.5 K/uL   RBC 3.86 (L) 4.22 - 5.81 MIL/uL   Hemoglobin 12.8 (L) 13.0 - 17.0 g/dL   HCT 35.9 (L) 39.0 - 52.0 %   MCV 93.0 80.0 - 100.0 fL   MCH 33.2 26.0 - 34.0 pg   MCHC 35.7 30.0 - 36.0 g/dL   RDW 14.0 11.5 - 15.5 %   Platelets 108 (L) 150 - 400 K/uL   nRBC 0.0 0.0 - 0.2 %   Neutrophils Relative % 60 %   Neutro Abs 2.7 1.7 - 7.7 K/uL   Lymphocytes Relative 25 %   Lymphs Abs 1.1 0.7 - 4.0 K/uL   Monocytes Relative 8 %   Monocytes Absolute 0.3 0.1 - 1.0 K/uL   Eosinophils Relative 4 %   Eosinophils Absolute 0.2 0.0 - 0.5 K/uL   Basophils Relative 1 %   Basophils Absolute 0.0 0.0 - 0.1 K/uL   Immature Granulocytes 2 %   Abs Immature Granulocytes 0.09 (H) 0.00 - 0.07  K/uL    Comment: Performed at [facility], [address] [place], [place] [address]  Magnesium     Status: Abnormal   Collection Time: 12/31/22  1:00 PM  Result Value Ref Range    Magnesium 1.1 (L) 1.7 - 2.4 mg/dL    Comment: Performed at [facility], [address] [place], [place] [address]  Surgical pathology     Status: None   Collection Time: 01/12/23 10:36 AM  Result Value Ref Range   SURGICAL PATHOLOGY      SURGICAL PATHOLOGY CASE: [id number] PATIENT: Gwenlyn Perking Surgical Pathology Report     Specimen Submitted: A. Stomach; cbx  Clinical History: Cirrhosis of liver without ascites.  Gastric ulcer    DIAGNOSIS: A. STOMACH; COLD BIOPSY: - GASTRIC ANTRAL MUCOSA WITH REACTIVE GASTROPATHY AND CHANGES OF HEALING MUCOSAL INJURY. - NEGATIVE FOR H. PYLORI, DYSPLASIA, AND MALIGNANCY.  Comment: The differential diagnosis for these findings includes drug/chemical injury (NSAID vs. other), bile reflux, and changes adjacent to an area of healing ulceration. Clinical correlation with endoscopic findings is required.  GROSS DESCRIPTION: A. Labeled: Cbx gastric ulcer Received: Formalin Collection time: 10:36 AM on 01/12/2023 Placed into formalin time: 10:36 AM on 01/12/2023 Tissue fragment(Kimberlie Csaszar): 2 Size: Ranges from 0.2-0.5 cm Description: Pink soft tissue fragments Entirely submitted in 1 cassette.  CM 01/12/2023  Final Diagnosis performed by Allena Napoleon, MD.   Gladys Damme ctronically signed 01/13/2023 11:11:20AM The electronic signature indicates that the named Attending Pathologist has evaluated the specimen Technical component performed at [facility], [address] [place], [place] [address] Lab: [phone] Dir: Rush Farmer, MD, MMM  Professional component performed at [facility], [facility], [address], [place], [place] [address] Lab: [phone] Dir: Kathi Simpers, MD   Magnesium     Status: Abnormal   Collection Time: 01/24/23  8:08 AM  Result Value Ref Range   Magnesium 1.6 (L) 1.7 - 2.4 mg/dL    Comment: Performed at [facility], [address] [place], [place] [address]  Comprehensive metabolic panel      Status: Abnormal   Collection Time: 01/24/23  8:08 AM  Result Value Ref Range   Sodium 135 135 - 145 mmol/L   Potassium 3.6 3.5 - 5.1 mmol/L   Chloride 104 98 - 111 mmol/L   CO2 22 22 - 32 mmol/L   Glucose, Bld 126 (H) 70 - 99 mg/dL    Comment: Glucose reference range applies only to samples taken after fasting for at least 8 hours.   BUN 28 (H) 8 - 23 mg/dL   Creatinine, Ser 1.03 0.61 - 1.24 mg/dL   Calcium 8.6 (L) 8.9 - 10.3 mg/dL   Total Protein 7.2 6.5 - 8.1 g/dL   Albumin 3.5 3.5 - 5.0 g/dL   AST 26 15 - 41 U/L   ALT 15 0 - 44 U/L   Alkaline Phosphatase 118 38 - 126 U/L   Total Bilirubin 0.6 0.3 - 1.2 mg/dL   GFR, Estimated >60 >60 mL/min    Comment: (NOTE) Calculated using the CKD-EPI Creatinine Equation (2021)    Anion gap 9 5 - 15    Comment: Performed at [facility], [address] [place],  [address]  CBC with Differential     Status: Abnormal   Collection Time: 01/24/23  8:08 AM  Result Value Ref Range   WBC 5.8 4.0 - 10.5 K/uL   RBC 3.98 (L) 4.22 - 5.81 MIL/uL   Hemoglobin 13.3 13.0 -  17.0 g/dL   HCT 37.6 (L) 39.0 - 52.0 %   MCV 94.5 80.0 - 100.0 fL   MCH 33.4 26.0 - 34.0 pg   MCHC 35.4 30.0 - 36.0 g/dL   RDW 13.9 11.5 - 15.5 %   Platelets 122 (L) 150 - 400 K/uL   nRBC 0.0 0.0 - 0.2 %   Neutrophils Relative % 67 %   Neutro Abs 3.9 1.7 - 7.7 K/uL   Lymphocytes Relative 16 %   Lymphs Abs 0.9 0.7 - 4.0 K/uL   Monocytes Relative 11 %   Monocytes Absolute 0.6 0.1 - 1.0 K/uL   Eosinophils Relative 4 %   Eosinophils Absolute 0.2 0.0 - 0.5 K/uL   Basophils Relative 1 %   Basophils Absolute 0.0 0.0 - 0.1 K/uL   Immature Granulocytes 1 %   Abs Immature Granulocytes 0.04 0.00 - 0.07 K/uL    Comment: Performed at [facility], [address] [place], [place] [address]  CEA     Status: Abnormal   Collection Time: 01/24/23  9:31 AM  Result Value Ref Range   CEA 5.5 (H) 0.0 - 4.7 ng/mL    Comment: (NOTE)                              Nonsmokers          <3.9                             Smokers             <5.6 [employer] Electrochemiluminescence Immunoassay (ECLIA) Values obtained with different assay methods or kits cannot be used interchangeably.  Results cannot be interpreted as absolute evidence of the presence or absence of malignant disease. Performed At: [facility] [place], [place] [id number] Rush Farmer MD [id number]       Assessment & Plan:   Problem List Items Addressed This Visit   None 1. Rib sprain, initial encounter - DG Chest 2 View  2. 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 Mixed hyperlipidemia    No follow-ups on file.   Total time spent: 30 minutes  Volanda Napoleon, MD  02/04/2023

## 2023-02-07 ENCOUNTER — Inpatient Hospital Stay (HOSPITAL_BASED_OUTPATIENT_CLINIC_OR_DEPARTMENT_OTHER): Payer: Medicare HMO | Admitting: Oncology

## 2023-02-07 ENCOUNTER — Ambulatory Visit: Payer: Medicare HMO

## 2023-02-07 ENCOUNTER — Ambulatory Visit: Payer: Medicare HMO | Admitting: Oncology

## 2023-02-07 ENCOUNTER — Inpatient Hospital Stay: Payer: Medicare HMO

## 2023-02-07 ENCOUNTER — Other Ambulatory Visit: Payer: Medicare HMO

## 2023-02-07 ENCOUNTER — Encounter: Payer: Self-pay | Admitting: Oncology

## 2023-02-07 VITALS — BP 124/59 | HR 75 | Temp 97.2°F | Ht 67.0 in | Wt 161.0 lb

## 2023-02-07 DIAGNOSIS — C189 Malignant neoplasm of colon, unspecified: Secondary | ICD-10-CM

## 2023-02-07 DIAGNOSIS — Z5111 Encounter for antineoplastic chemotherapy: Secondary | ICD-10-CM

## 2023-02-07 DIAGNOSIS — Z79899 Other long term (current) drug therapy: Secondary | ICD-10-CM | POA: Diagnosis not present

## 2023-02-07 DIAGNOSIS — C772 Secondary and unspecified malignant neoplasm of intra-abdominal lymph nodes: Secondary | ICD-10-CM

## 2023-02-07 DIAGNOSIS — Z5112 Encounter for antineoplastic immunotherapy: Secondary | ICD-10-CM

## 2023-02-07 DIAGNOSIS — K7460 Unspecified cirrhosis of liver: Secondary | ICD-10-CM | POA: Diagnosis not present

## 2023-02-07 DIAGNOSIS — C786 Secondary malignant neoplasm of retroperitoneum and peritoneum: Secondary | ICD-10-CM | POA: Diagnosis not present

## 2023-02-07 DIAGNOSIS — K259 Gastric ulcer, unspecified as acute or chronic, without hemorrhage or perforation: Secondary | ICD-10-CM | POA: Diagnosis not present

## 2023-02-07 DIAGNOSIS — D696 Thrombocytopenia, unspecified: Secondary | ICD-10-CM | POA: Diagnosis not present

## 2023-02-07 LAB — COMPREHENSIVE METABOLIC PANEL
ALT: 21 U/L (ref 0–44)
AST: 24 U/L (ref 15–41)
Albumin: 3.5 g/dL (ref 3.5–5.0)
Alkaline Phosphatase: 110 U/L (ref 38–126)
Anion gap: 9 (ref 5–15)
BUN: 31 mg/dL — ABNORMAL HIGH (ref 8–23)
CO2: 23 mmol/L (ref 22–32)
Calcium: 8.5 mg/dL — ABNORMAL LOW (ref 8.9–10.3)
Chloride: 104 mmol/L (ref 98–111)
Creatinine, Ser: 1.33 mg/dL — ABNORMAL HIGH (ref 0.61–1.24)
GFR, Estimated: 56 mL/min — ABNORMAL LOW (ref >60)
Glucose, Bld: 98 mg/dL (ref 70–99)
Potassium: 4.1 mmol/L (ref 3.5–5.1)
Sodium: 136 mmol/L (ref 135–145)
Total Bilirubin: 0.6 mg/dL (ref 0.3–1.2)
Total Protein: 6.8 g/dL (ref 6.5–8.1)

## 2023-02-07 LAB — CBC WITH DIFFERENTIAL/PLATELET
Abs Immature Granulocytes: 0.02 10*3/uL (ref 0.00–0.07)
Basophils Absolute: 0 10*3/uL (ref 0.0–0.1)
Basophils Relative: 1 %
Eosinophils Absolute: 0.2 10*3/uL (ref 0.0–0.5)
Eosinophils Relative: 4 %
HCT: 38.2 % — ABNORMAL LOW (ref 39.0–52.0)
Hemoglobin: 12.9 g/dL — ABNORMAL LOW (ref 13.0–17.0)
Immature Granulocytes: 0 %
Lymphocytes Relative: 17 %
Lymphs Abs: 0.9 10*3/uL (ref 0.7–4.0)
MCH: 32.6 pg (ref 26.0–34.0)
MCHC: 33.8 g/dL (ref 30.0–36.0)
MCV: 96.5 fL (ref 80.0–100.0)
Monocytes Absolute: 0.4 10*3/uL (ref 0.1–1.0)
Monocytes Relative: 9 %
Neutro Abs: 3.5 10*3/uL (ref 1.7–7.7)
Neutrophils Relative %: 69 %
Platelets: 98 10*3/uL — ABNORMAL LOW (ref 150–400)
RBC: 3.96 MIL/uL — ABNORMAL LOW (ref 4.22–5.81)
RDW: 14 % (ref 11.5–15.5)
WBC: 5 10*3/uL (ref 4.0–10.5)
nRBC: 0 % (ref 0.0–0.2)

## 2023-02-07 LAB — MAGNESIUM: Magnesium: 1.4 mg/dL — ABNORMAL LOW (ref 1.7–2.4)

## 2023-02-07 MED ORDER — SODIUM CHLORIDE 0.9 % IV SOLN
6.0000 mg/kg | Freq: Once | INTRAVENOUS | Status: AC
  Administered 2023-02-07: 400 mg via INTRAVENOUS
  Filled 2023-02-07: qty 20

## 2023-02-07 MED ORDER — PALONOSETRON HCL INJECTION 0.25 MG/5ML
0.2500 mg | Freq: Once | INTRAVENOUS | Status: AC
  Administered 2023-02-07: 0.25 mg via INTRAVENOUS
  Filled 2023-02-07: qty 5

## 2023-02-07 MED ORDER — MAGNESIUM SULFATE 2 GM/50ML IV SOLN
2.0000 g | Freq: Once | INTRAVENOUS | Status: AC
  Administered 2023-02-07: 2 g via INTRAVENOUS
  Filled 2023-02-07: qty 50

## 2023-02-07 MED ORDER — HEPARIN SOD (PORK) LOCK FLUSH 100 UNIT/ML IV SOLN
500.0000 [IU] | Freq: Once | INTRAVENOUS | Status: AC | PRN
  Administered 2023-02-07: 500 [IU]
  Filled 2023-02-07: qty 5

## 2023-02-07 MED ORDER — SODIUM CHLORIDE 0.9 % IV SOLN
Freq: Once | INTRAVENOUS | Status: AC
  Filled 2023-02-07: qty 250

## 2023-02-07 MED ORDER — ATROPINE SULFATE 1 MG/ML IV SOLN
0.5000 mg | Freq: Once | INTRAVENOUS | Status: AC | PRN
  Administered 2023-02-07: 0.5 mg via INTRAVENOUS
  Filled 2023-02-07: qty 1

## 2023-02-07 MED ORDER — SODIUM CHLORIDE 0.9 % IV SOLN
125.0000 mg/m2 | Freq: Once | INTRAVENOUS | Status: AC
  Administered 2023-02-07: 220 mg via INTRAVENOUS
  Filled 2023-02-07: qty 10

## 2023-02-07 MED ORDER — SODIUM CHLORIDE 0.9 % IV SOLN
10.0000 mg | Freq: Once | INTRAVENOUS | Status: AC
  Administered 2023-02-07: 10 mg via INTRAVENOUS
  Filled 2023-02-07: qty 1
  Filled 2023-02-07: qty 10

## 2023-02-07 NOTE — Progress Notes (Signed)
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 He currently smokes 0.5ppd. he presented to the ER with symptoms of left sided chest pain and left arm pain. Troponin was negative ekg was unremarkable. Ct chest showed no PE. He was incidentally noted to have mediastinal and retrocrural adenopathy and a 2.1X2.3X4.4 cm retroaortic soft tissue lesion in the posterior left chest. 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  Low-grade activity in the left lateral fifth and sixth ribs associated with nondisplaced fractures likely reflecting healing response problem malignancy.   Patient underwent CT-guided biopsy of the retroperitoneal lymph node pathology showed metastatic adenocarcinoma compatible with colorectal origin.  CK7 negative.  CK20 positive.  CDX 2+.  TTF-1 negative.  PSA negative.  This pattern of immunoreactivity supports the above diagnosis. Patient underwent colonoscopy which showed sigmoid mass that was consistent with adenocarcinoma.  RAS panel testing showed that he was wild-type for both K-ras and BRAF   FOLFOX and bevacizumab chemotherapy started in July 2020.  Subsequently oxaliplatin has been on hold given neuropathy.  Recent scans have shown slow  increase in the size of intra-abdominal adenopathy but patient continues to have low-volume disease.plan is to switch him to second line Xeloda irinotecan panitumumab  regimen.  Patient received palliative radiation therapy to the enlarging external iliac lymph nodes    Interval history-patient is tolerating present regimen of Xeloda irinotecan perjeta, chemotherapy well without any significant side effects. He did have a fall few weeks ago and hurt his right chest wall causing some chest wall pain.  There is no acute fracture noted on CT scan.  ECOG PS- 0 Pain scale- 0 Opioid associated constipation- no  Review of systems- Review of Systems  Constitutional:  Negative for chills, fever, malaise/fatigue and weight loss.  HENT:  Negative for congestion, ear discharge and nosebleeds.   Eyes:  Negative for blurred vision.  Respiratory:  Negative for cough, hemoptysis, sputum production, shortness of breath and wheezing.        Right chest wall pain  Cardiovascular:  Negative for chest pain, palpitations, orthopnea and claudication.  Gastrointestinal:  Negative for abdominal pain, blood in stool, constipation, diarrhea, heartburn, melena, nausea and vomiting.  Genitourinary:  Negative for dysuria, flank pain, frequency, hematuria and urgency.  Musculoskeletal:  Negative for back pain, joint pain and myalgias.  Skin:  Negative for rash.  Neurological:  Negative for dizziness, tingling, focal weakness, seizures, weakness and headaches.  Endo/Heme/Allergies:  Does not bruise/bleed easily.  Psychiatric/Behavioral:  Negative for depression and suicidal ideas. The patient does not have insomnia.       No Known Allergies   Past Medical History:  Diagnosis Date   Colon cancer ([place])    COPD (chronic obstructive pulmonary disease) (HCC)    Hyperlipidemia      Past  Surgical History:  Procedure Laterality Date   COLONOSCOPY WITH PROPOFOL N/A 06/26/2019   Procedure: COLONOSCOPY WITH PROPOFOL;   Surgeon: [name], MD;  Location[facility] ENDOSCOPY;  Service: Gastroenterology;  Laterality: N/A;   ESOPHAGOGASTRODUODENOSCOPY (EGD) WITH PROPOFOL N/A 01/12/2023   Procedure: ESOPHAGOGASTRODUODENOSCOPY (EGD) WITH PROPOFOL;  Surgeon: [name], MD;  Location: [facility] ENDOSCOPY;  Service: Gastroenterology;  Laterality: N/A;   FLEXIBLE SIGMOIDOSCOPY N/A 05/07/2021   Procedure: FLEXIBLE SIGMOIDOSCOPY;  Surgeon: [name], MD;  Location: [facility] ENDOSCOPY;  Service: Gastroenterology;  Laterality: N/A;   PORTA CATH INSERTION N/A 06/25/2019   Procedure: PORTA CATH INSERTION;  Surgeon: Algernon Huxley, MD;  Location: [place] CV LAB;  Service: Cardiovascular;  Laterality: N/A;   PORTA CATH INSERTION Left 08/11/2020   Procedure: PORTA CATH INSERTION;  Surgeon: Algernon Huxley, MD;  Location: [place] CV LAB;  Service: Cardiovascular;  Laterality: Left;   PORTA CATH REMOVAL Right 08/11/2020   Procedure: PORTA CATH REMOVAL;  Surgeon: Algernon Huxley, MD;  Location: [place] CV LAB;  Service: Cardiovascular;  Laterality: Right;    Social History   Socioeconomic History   Marital status: Single    Spouse name: Not on file   Number of children: Not on file   Years of education: Not on file   Highest education level: Not on file  Occupational History   Not on file  Tobacco Use   Smoking status: Every Day    Packs/day: 0.50    Years: 40.00    Total pack years: 20.00    Types: Cigarettes   Smokeless tobacco: Never   Tobacco comments:    smoking 40 years  Vaping Use   Vaping Use: Never used  Substance and Sexual Activity   Alcohol use: No   Drug use: No   Sexual activity: Not Currently  Other Topics Concern   Not on file  Social History Narrative   Not on file   Social Determinants of Health   Financial Resource Strain: Low Risk  (06/12/2019)   Overall Financial Resource Strain (CARDIA)    Difficulty of Paying Living Expenses: Not hard at all  Food Insecurity: No Food Insecurity  (06/12/2019)   Hunger Vital Sign    Worried About Running Out of Food in the Last Year: Never true    Ran Out of Food in the Last Year: Never true  Transportation Needs: No Transportation Needs (06/12/2019)   PRAPARE - Hydrologist (Medical): No    Lack of Transportation (Non-Medical): No  Physical Activity: Not on file  Stress: No Stress Concern Present (06/12/2019)   [place]    Feeling of Stress : Not at all  Social Connections: Unknown (06/12/2019)   Social Connection and Isolation Panel [NHANES]    Frequency of Communication with Friends and Family: More than three times a week    Frequency of Social Gatherings with Friends and Family: Not on file    Attends Religious Services: Not on file    Active Member of Clubs or Organizations: Not on file    Attends Archivist Meetings: Not on file    Marital Status: Not on file  Intimate Partner Violence: Not At Risk (06/12/2019)   Humiliation, Afraid, Rape, and Kick questionnaire    Fear of Current or Ex-Partner: No    Emotionally Abused: No    Physically Abused: No    Sexually Abused: No    Family History  Problem Relation Age of Onset   Brain cancer Father      Current Outpatient Medications:    allopurinol (ZYLOPRIM) 100 MG tablet, TAKE 1 TABLET BY MOUTH EVERY DAY, Disp: 90 tablet, Rfl: 1   aspirin EC 81 MG tablet, Take 81 mg by mouth daily., Disp: , Rfl:    azelastine (ASTELIN) 0.1 % nasal spray, Place 1 spray into both nostrils daily as needed., Disp: , Rfl:    capecitabine (XELODA) 500 MG tablet, Take 1 tablet (500 mg total) by mouth 2 (two) times daily after a meal. Take for 14 days, then hold for 7 days. Repeat every 21 days., Disp: 28 tablet, Rfl: 0   cetirizine (ZYRTEC) 10 MG tablet, Take 1 tablet (10 mg total) by mouth daily., Disp: 30 tablet, Rfl: 0   chlorthalidone (HYGROTON) 25 MG tablet, TAKE 1/2 TABLET BY MOUTH  EVERY DAY AS DIRECTED, Disp: 90 tablet, Rfl: 1   cyclobenzaprine (FLEXERIL) 10 MG tablet, TAKE 1 TABLET(10 MG) BY MOUTH THREE TIMES DAILY AS NEEDED FOR MUSCLE SPASMS, Disp: 42 tablet, Rfl: 0   dexlansoprazole (DEXILANT) 60 MG capsule, Take 1 capsule (60 mg total) by mouth every morning., Disp: 90 capsule, Rfl: 1   DULoxetine (CYMBALTA) 30 MG capsule, Take 1 capsule (30 mg total) by mouth 2 (two) times daily., Disp: 60 capsule, Rfl: 1   fluticasone (FLONASE) 50 MCG/ACT nasal spray, Place 2 sprays into both nostrils daily as needed., Disp: , Rfl:    lidocaine-prilocaine (EMLA) cream, Apply 1 Application topically as needed (pt will put 1 inch of cream over port 1 hourfor each treatment, cover plastic over the cream protect clothes)., Disp: 30 g, Rfl: 3   lisinopril (ZESTRIL) 5 MG tablet, Take 5 mg by mouth daily., Disp: , Rfl:    montelukast (SINGULAIR) 10 MG tablet, Take 10 mg by mouth at bedtime., Disp: , Rfl:    NEXLETOL 180 MG TABS, Take 1 tablet (180 mg total) by mouth daily., Disp: 90 tablet, Rfl: 1   pregabalin (LYRICA) 75 MG capsule, Take lyrica 1 tablet in and and then 2 tablets at night, Disp: 90 capsule, Rfl: 2   simvastatin (ZOCOR) 20 MG tablet, Take 20 mg by mouth daily. , Disp: , Rfl:    SYMBICORT 80-4.5 MCG/ACT inhaler, Inhale 2 puffs into the lungs daily., Disp: , Rfl:    tamsulosin (FLOMAX) 0.4 MG CAPS capsule, Take 0.4 mg by mouth. , Disp: , Rfl:  No current facility-administered medications for this visit.  Facility-Administered Medications Ordered in Other Visits:    atropine 1 MG/ML injection, , , ,    heparin lock flush 100 UNIT/ML injection, , , ,    heparin lock flush 100 unit/mL, 500 Units, Intracatheter, Once PRN, Sindy Guadeloupe, MD   irinotecan (CAMPTOSAR) 220 mg in sodium chloride 0.9 % 500 mL chemo infusion, 125 mg/m2 (Treatment Plan Recorded), Intravenous, Once, Sindy Guadeloupe, MD   magnesium sulfate IVPB 2 g 50 mL, 2 g, Intravenous, Once, Sindy Guadeloupe, MD, Last  Rate: 50 mL/hr at 02/07/23 0942, 2 g at 02/07/23 0942   panitumumab (VECTIBIX) 400 mg in sodium chloride 0.9 % 100 mL chemo infusion, 6 mg/kg (Treatment Plan Recorded), Intravenous, Once, Sindy Guadeloupe, MD  Physical exam:  Vitals:   02/07/23 0829  BP: (!) 124/59  Pulse: 75  Temp: (!) 97.2 F (36.2 C)  TempSrc: Tympanic  SpO2: 100%  Weight: 161 lb (73 kg)  Height: '5\' 7"'$  (1.702 m)   Physical Exam  Cardiovascular:     Rate and Rhythm: Normal rate and regular rhythm.     Heart sounds: Normal heart sounds.  Pulmonary:     Effort: Pulmonary effort is normal.     Breath sounds: Normal breath sounds.  Skin:    General: Skin is warm and dry.  Neurological:     Mental Status: He is alert and oriented to person, place, and time.         Latest Ref Rng & Units 02/07/2023    8:10 AM  CMP  Glucose 70 - 99 mg/dL 98   BUN 8 - 23 mg/dL 31   Creatinine 0.61 - 1.24 mg/dL 1.33   Sodium 135 - 145 mmol/L 136   Potassium 3.5 - 5.1 mmol/L 4.1   Chloride 98 - 111 mmol/L 104   CO2 22 - 32 mmol/L 23   Calcium 8.9 - 10.3 mg/dL 8.5   Total Protein 6.5 - 8.1 g/dL 6.8   Total Bilirubin 0.3 - 1.2 mg/dL 0.6   Alkaline Phos 38 - 126 U/L 110   AST 15 - 41 U/L 24   ALT 0 - 44 U/L 21       Latest Ref Rng & Units 02/07/2023    8:10 AM  CBC  WBC 4.0 - 10.5 K/uL 5.0   Hemoglobin 13.0 - 17.0 g/dL 12.9   Hematocrit 39.0 - 52.0 % 38.2   Platelets 150 - 400 K/uL 98     No images are attached to the encounter.  CT CHEST ABDOMEN PELVIS W CONTRAST  Result Date: 02/01/2023 CLINICAL DATA:  Metastatic colon cancer restaging, ongoing chemotherapy * Tracking Code: BO * EXAM: CT CHEST, ABDOMEN, AND PELVIS WITH CONTRAST TECHNIQUE: Multidetector CT imaging of the chest, abdomen and pelvis was performed following the standard protocol during bolus administration of intravenous contrast. RADIATION DOSE REDUCTION: This exam was performed according to the departmental dose-optimization program which includes  automated exposure control, adjustment of the mA and/or kV according to patient size and/or use of iterative reconstruction technique. CONTRAST:  167m OMNIPAQUE IOHEXOL 300 MG/ML SOLN additional oral enteric contrast COMPARISON:  CT chest abdomen pelvis, 09/17/2022, PET-CT, 07/12/2022 FINDINGS: CT CHEST FINDINGS Cardiovascular: Aortic atherosclerosis. Normal heart size. Left and right coronary artery calcifications. No pericardial effusion. Mediastinum/Nodes: No enlarged mediastinal, hilar, or axillary lymph nodes. Thyroid gland, trachea, and esophagus demonstrate no significant findings. Lungs/Pleura: Severe centrilobular and paraseptal emphysema. Diffuse bilateral bronchial wall thickening. No pleural effusion or pneumothorax. Musculoskeletal: No chest wall abnormality. No acute osseous findings. CT ABDOMEN PELVIS FINDINGS Hepatobiliary: Somewhat coarse contour of the liver. No gallstones, gallbladder wall thickening, or biliary dilatation. Pancreas: Unremarkable. No pancreatic ductal dilatation or surrounding inflammatory changes. Spleen: Splenomegaly, maximum span 15.3 cm. Adrenals/Urinary Tract: Adrenal glands are unremarkable. Kidneys are normal, without renal calculi, solid lesion, or hydronephrosis. Bladder is unremarkable. Stomach/Bowel: Stomach is within normal limits. Appendix appears normal. No evidence of bowel wall thickening, distention, or inflammatory changes. Sigmoid diverticulosis. Vascular/Lymphatic: Aortic atherosclerosis. Interval enlargement an aortocaval lymph node, now measuring 2.1 x 1.6 cm, previously 1.5 x 1.2 cm (series 2, image 69). Interval enlargement of a small lymph node overlying the right diaphragmatic crus measuring 1.1 x 0.9 cm, previously 0.8 x 0.6 cm (series 2, image 62). Interval decrease in size of a left external iliac lymph node measuring 1.2 x 0.7 cm, previously 2.2 x 1.4 cm (series 2, image 101). Unchanged left pelvic sidewall lymph node measuring 1.2 x 1.0 cm (2, image  105). Reproductive: Mild prostatomegaly.  Other: Small, fat containing umbilical hernia.  No ascites. Musculoskeletal: No acute osseous findings. IMPRESSION: 1. Interval enlargement of an aortocaval lymph node, now measuring 2.1 x 1.6 cm, previously 1.5 x 1.2 cm. Interval enlargement of a small lymph node overlying the right diaphragmatic crus measuring 1.1 x 0.9 cm, previously 0.8 x 0.6 cm. Although not previously FDG avid, enlargement of these nodes is worrisome for new nodal metastatic disease. 2. Interval decrease in size of a left external iliac lymph node and unchanged left pelvic sidewall lymph nodes, consistent with sustained treatment response these nodes previously FDG avid. 3. No other evidence of lymphadenopathy or organ metastatic disease in the chest, abdomen, or pelvis. 4. Somewhat coarse contour of the liver, again suggestive of cirrhosis. Splenomegaly. 5. Severe emphysema. 6. Coronary artery disease. Aortic Atherosclerosis (ICD10-I70.0) and Emphysema (ICD10-J43.9). Electronically Signed   By: Delanna Ahmadi M.D.   On: 02/01/2023 15:43     Assessment and plan- Patient is a 72 y.o. male  with metastatic colon cancer with lymph node metastases.  He is here for on treatment assessment prior to cycle 21 of daratumumab undertaken chemotherapy and discussed CT scan results and further management   I have reviewed CT chest abdomen pelvis images independently and discussed findings with the patient which shows now gradual enlargement of the aortocaval as well as subdiaphragmatic lymph node.  External iliac lymphWhich was pretty basically radiated is currently stable pelvic sidewall lymph nodes are stable.  There is no other evidence of metastatic disease.  Overall the burden of his disease is low but given the rise in his CEA and continued enlargement of intra-abdominal lymph nodes I would like to switch his treatment moving forward.  He will proceed with irinotecan panitumumab chemotherapy today and take  Xeloda for this cycle.  I did discuss his case with Dr. Kathlene Cote and given the location of the aortocaval lymph nodes these are not amenable to biopsy.  I will be sending of peripheral blood foundation 1 testing to see if there are any actionable mutations such as TRK or RET mutations.  If he does not have any actionable mutations I will switch him to Lonsurf plus Avastin regimen.  I will see him 02/28/2023 to discuss the results of foundation 1 testing and further management   Visit Diagnosis 1. High risk medication use   2. 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 ([place])   3. Encounter for antineoplastic chemotherapy   4. Encounter for monoclonal antibody treatment for malignancy      Dr. Randa Evens, MD, MPH [facility] [id number] 02/07/2023 10:12 AM

## 2023-02-07 NOTE — Patient Instructions (Signed)
[facility]  Discharge Instructions: Thank you for choosing [facility] to provide your oncology and hematology care.  If you have a lab appointment with the [facility], please go directly to the [facility] and check in at the registration area.  Wear comfortable clothing and clothing appropriate for easy access to any Portacath or PICC line.   We strive to give you quality time with your provider. You may need to reschedule your appointment if you arrive late (15 or more minutes).  Arriving late affects you and other patients whose appointments are after yours.  Also, if you miss three or more appointments without notifying the office, you may be dismissed from the clinic at the provider's discretion.      For prescription refill requests, have your pharmacy contact our office and allow 72 hours for refills to be completed.    Today you received the following chemotherapy and/or immunotherapy agents Vectibix, Camptosar      To help prevent nausea and vomiting after your treatment, we encourage you to take your nausea medication as directed.  BELOW ARE SYMPTOMS THAT SHOULD BE REPORTED IMMEDIATELY: *FEVER GREATER THAN 100.4 F (38 C) OR HIGHER *CHILLS OR SWEATING *NAUSEA AND VOMITING THAT IS NOT CONTROLLED WITH YOUR NAUSEA MEDICATION *UNUSUAL SHORTNESS OF BREATH *UNUSUAL BRUISING OR BLEEDING *URINARY PROBLEMS (pain or burning when urinating, or frequent urination) *BOWEL PROBLEMS (unusual diarrhea, constipation, pain near the anus) TENDERNESS IN MOUTH AND THROAT WITH OR WITHOUT PRESENCE OF ULCERS (sore throat, sores in mouth, or a toothache) UNUSUAL RASH, SWELLING OR PAIN  UNUSUAL VAGINAL DISCHARGE OR ITCHING   Items with * indicate a potential emergency and should be followed up as soon as possible or go to the Emergency Department if any problems should occur.  Please show the CHEMOTHERAPY ALERT CARD or IMMUNOTHERAPY ALERT CARD at  check-in to the Emergency Department and triage nurse.  Should you have questions after your visit or need to cancel or reschedule your appointment, please contact [facility]  [phone] and follow the prompts.  Office hours are 8:00 a.m. to 4:30 p.m. Monday - Friday. Please note that voicemails left after 4:00 p.m. may not be returned until the following business day.  We are closed weekends and major holidays. You have access to a nurse at all times for urgent questions. Please call the main number to the clinic [phone] and follow the prompts.  For any non-urgent questions, you may also contact your provider using MyChart. We now offer e-Visits for anyone 73 and older to request care online for non-urgent symptoms. For details visit [email or web address].   Also download the MyChart app! Go to the app store, search "MyChart", open the app, select [facility], and log in with your MyChart username and password.

## 2023-02-07 NOTE — Patient Instructions (Signed)

## 2023-02-07 NOTE — Progress Notes (Signed)
No concerns today 

## 2023-02-08 ENCOUNTER — Encounter: Payer: Self-pay | Admitting: Cardiovascular Disease

## 2023-02-08 ENCOUNTER — Other Ambulatory Visit: Payer: Self-pay | Admitting: Internal Medicine

## 2023-02-08 ENCOUNTER — Ambulatory Visit: Payer: Medicare HMO

## 2023-02-08 ENCOUNTER — Ambulatory Visit (INDEPENDENT_AMBULATORY_CARE_PROVIDER_SITE_OTHER): Payer: Medicare HMO | Admitting: Cardiovascular Disease

## 2023-02-08 VITALS — BP 120/65 | HR 88 | Ht 67.0 in | Wt 161.8 lb

## 2023-02-08 DIAGNOSIS — I10 Essential (primary) hypertension: Secondary | ICD-10-CM | POA: Diagnosis not present

## 2023-02-08 DIAGNOSIS — Z9221 Personal history of antineoplastic chemotherapy: Secondary | ICD-10-CM | POA: Diagnosis not present

## 2023-02-08 DIAGNOSIS — E782 Mixed hyperlipidemia: Secondary | ICD-10-CM

## 2023-02-08 DIAGNOSIS — I7121 Aneurysm of the ascending aorta, without rupture: Secondary | ICD-10-CM

## 2023-02-08 DIAGNOSIS — Z720 Tobacco use: Secondary | ICD-10-CM | POA: Diagnosis not present

## 2023-02-08 DIAGNOSIS — E785 Hyperlipidemia, unspecified: Secondary | ICD-10-CM | POA: Diagnosis not present

## 2023-02-08 NOTE — Assessment & Plan Note (Signed)
Well controlled. Continue same medications. Echo to check heart function after being on chemo.

## 2023-02-08 NOTE — Assessment & Plan Note (Signed)
Echo 04/2022 Aortic root diameter 4.0  cm.

## 2023-02-08 NOTE — Progress Notes (Signed)
Cardiology Office Note   Date:  02/08/2023   ID:  Ruan, Atondo February 06, 1951, MRN [id number]  PCP:  Jodi Marble, MD  Cardiologist:  Neoma Laming, MD      History of Present Illness: Eric Simon is a 72 y.o. male who presents for  Chief Complaint  Patient presents with   Follow-up    9 month follow up    Patient in office for routine cardiac exam. Denies chest pain, shortness of breath, dizziness.     Past Medical History:  Diagnosis Date   Colon cancer ([place])    COPD (chronic obstructive pulmonary disease) ([place])    Hyperlipidemia      Past Surgical History:  Procedure Laterality Date   COLONOSCOPY WITH PROPOFOL N/A 06/26/2019   Procedure: COLONOSCOPY WITH PROPOFOL;  Surgeon: [name], MD;  Location: [facility] ENDOSCOPY;  Service: Gastroenterology;  Laterality: N/A;   ESOPHAGOGASTRODUODENOSCOPY (EGD) WITH PROPOFOL N/A 01/12/2023   Procedure: ESOPHAGOGASTRODUODENOSCOPY (EGD) WITH PROPOFOL;  Surgeon: [name], MD;  Location: [facility] ENDOSCOPY;  Service: Gastroenterology;  Laterality: N/A;   FLEXIBLE SIGMOIDOSCOPY N/A 05/07/2021   Procedure: FLEXIBLE SIGMOIDOSCOPY;  Surgeon: [name], MD;  Location: [facility] ENDOSCOPY;  Service: Gastroenterology;  Laterality: N/A;   PORTA CATH INSERTION N/A 06/25/2019   Procedure: PORTA CATH INSERTION;  Surgeon: Algernon Huxley, MD;  Location: [place] [facility] LAB;  Service: Cardiovascular;  Laterality: N/A;   PORTA CATH INSERTION Left 08/11/2020   Procedure: PORTA CATH INSERTION;  Surgeon: Algernon Huxley, MD;  Location: [place] [facility] LAB;  Service: Cardiovascular;  Laterality: Left;   PORTA CATH REMOVAL Right 08/11/2020   Procedure: PORTA CATH REMOVAL;  Surgeon: Algernon Huxley, MD;  Location: [place] [facility];  Service: Cardiovascular;  Laterality: Right;     Current Outpatient Medications  Medication Sig Dispense Refill   allopurinol (ZYLOPRIM) 100 MG tablet TAKE 1 TABLET BY MOUTH EVERY DAY 90 tablet 1   aspirin EC 81 MG  tablet Take 81 mg by mouth daily.     azelastine (ASTELIN) 0.1 % nasal spray Place 1 spray into both nostrils daily as needed.     capecitabine (XELODA) 500 MG tablet Take 1 tablet (500 mg total) by mouth 2 (two) times daily after a meal. Take for 14 days, then hold for 7 days. Repeat every 21 days. 28 tablet 0   cetirizine (ZYRTEC) 10 MG tablet Take 1 tablet (10 mg total) by mouth daily. 30 tablet 0   chlorthalidone (HYGROTON) 25 MG tablet TAKE 1/2 TABLET BY MOUTH EVERY DAY AS DIRECTED 90 tablet 1   cyclobenzaprine (FLEXERIL) 10 MG tablet TAKE 1 TABLET(10 MG) BY MOUTH THREE TIMES DAILY AS NEEDED FOR MUSCLE SPASMS 42 tablet 0   dexlansoprazole (DEXILANT) 60 MG capsule Take 1 capsule (60 mg total) by mouth every morning. 90 capsule 1   DULoxetine (CYMBALTA) 30 MG capsule Take 1 capsule (30 mg total) by mouth 2 (two) times daily. 60 capsule 1   fluticasone (FLONASE) 50 MCG/ACT nasal spray Place 2 sprays into both nostrils daily as needed.     lidocaine-prilocaine (EMLA) cream Apply 1 Application topically as needed (pt will put 1 inch of cream over port 1 hourfor each treatment, cover plastic over the cream protect clothes). 30 g 3   lisinopril (ZESTRIL) 5 MG tablet Take 5 mg by mouth daily.     montelukast (SINGULAIR) 10 MG tablet Take 10 mg by mouth at bedtime.     NEXLETOL 180 MG  TABS Take 1 tablet (180 mg total) by mouth daily. 90 tablet 1   pregabalin (LYRICA) 75 MG capsule Take lyrica 1 tablet in and and then 2 tablets at night 90 capsule 2   simvastatin (ZOCOR) 20 MG tablet Take 20 mg by mouth daily.      SYMBICORT 80-4.5 MCG/ACT inhaler Inhale 2 puffs into the lungs daily.     tamsulosin (FLOMAX) 0.4 MG CAPS capsule Take 0.4 mg by mouth.      No current facility-administered medications for this visit.   Facility-Administered Medications Ordered in Other Visits  Medication Dose Route Frequency Provider Last Rate Last Admin   atropine 1 MG/ML injection            heparin lock flush 100  UNIT/ML injection             Allergies:   Patient has no known allergies.    Social History:   reports that he has been smoking cigarettes. He has a 20.00 pack-year smoking history. He has never used smokeless tobacco. He reports that he does not drink alcohol and does not use drugs.   Family History:  family history includes Brain cancer in his father.    ROS:     Review of Systems  Constitutional: Negative.   HENT: Negative.    Eyes: Negative.   Respiratory: Negative.    Gastrointestinal: Negative.   Genitourinary: Negative.   Musculoskeletal: Negative.   Skin: Negative.   Neurological: Negative.   Endo/Heme/Allergies: Negative.   Psychiatric/Behavioral: Negative.    All other systems reviewed and are negative.    All other systems are reviewed and negative.    PHYSICAL EXAM: VS:  There were no vitals taken for this visit. , BMI There is no height or weight on file to calculate BMI. Last weight:  Wt Readings from Last 3 Encounters:  02/07/23 161 lb (73 kg)  02/04/23 160 lb (72.6 kg)  01/24/23 160 lb 9.6 oz (72.8 kg)     Physical Exam Vitals reviewed.  Constitutional:      Appearance: Normal appearance. He is normal weight.  HENT:     Head: Normocephalic.     Nose: Nose normal.     Mouth/Throat:     Mouth: Mucous membranes are moist.  Eyes:     Pupils: Pupils are equal, round, and reactive to light.  Cardiovascular:     Rate and Rhythm: Normal rate and regular rhythm.     Pulses: Normal pulses.     Heart sounds: Normal heart sounds.  Pulmonary:     Effort: Pulmonary effort is normal.  Abdominal:     General: Abdomen is flat. Bowel sounds are normal.  Musculoskeletal:        General: Normal range of motion.     Cervical back: Normal range of motion.  Skin:    General: Skin is warm.  Neurological:     General: No focal deficit present.     Mental Status: He is alert.  Psychiatric:        Mood and Affect: Mood normal.      EKG: none  today  Recent Labs: 02/07/2023: ALT 21; BUN 31; Creatinine, Ser 1.33; Hemoglobin 12.9; Magnesium 1.4; Platelets 98; Potassium 4.1; Sodium 136    Lipid Panel No results found for: "CHOL", "TRIG", "HDL", "CHOLHDL", "VLDL", "[place]", "LDLDIRECT"    Other studies Reviewed: Patient: [place] DOB:  1951-06-06  Date:  05/05/2022 13:46 Provider: Neoma Laming MD Encounter: [place]  TEST   Page 1 TESTS    [facility] [address] [place], [place] [address] [phone] STUDY:  Gated Stress / Rest Myocardial Perfusion Imaging Tomographic (SPECT) Including attenuation correction Wall Motion, Left Ventricular Ejection Fraction By Gated Technique.Persantine Stress Test. SEX:  Male  WEIGHT: 157 lbs   HEIGHT: 67 in    ARMS UP: YES/NO                                                                                                                                                                                REFERRING PHYSICIAN: Dr.Nashly Olsson                                                                                                                                                                                                                      INDICATION FOR STUDY: CAD  TECHNIQUE:  Approximately 20 minutes following the intravenous administration of 10.1 mCi of Tc-70mSestamibi after stress testing in a reclined supine position with arms above their head if able to do so, gated SPECT imaging of the heart was performed. After about a 2hr break, the patient was injected intravenously with 31.5 mCi of Tc-930mestamibi.  Approximately 45 minutes later in the same position as stress imaging SPECT rest imaging of the heart was performed.   STRESS BY:  ShNeoma LamingMD PROTOCOL:  Persantine  DOSE ADMIN: 8.0 CC    ROUTE OF ADMINISTRATION: IV                                                                            MAX PRED HR: 148                     85%: 126               75%: 111                                                                                                                   RESTING BP: 142/78  RESTING HR: 91  PEAK BP: 138/72   PEAK HR: 95                                                                    EXERCISE DURATION:    4 min injection                                            REASON FOR TEST TERMINATION:    Protocol end                                                                                                                              SYMPTOMS:  None                                                                                                                                                                                                          EKG RESULTS: NSR. 71/min. Non specific ST/T wave changes, no significant ST changes with persantine.                                                              IMAGE QUALITY: Good  PERFUSION/WALL MOTION FINDINGS: EF = 92%. No perfusion defects, normal wall motion.                                                                           IMPRESSION:  Normal stress test with normal LVEF.                                                                                                                                                                                                                                                                                         Neoma Laming, MD Stress Interpreting Physician / Nuclear Interpreting Physician           Neoma Laming MD  Electronically signed by: Neoma Laming     Date: 05/07/2022 10:00  Patient: [place] DOB:  April 08, 1951  Date:  04/20/2022 11:30 Provider: Neoma Laming MD Encounter: ECHO   Page 2 REASON FOR VISIT  Visit for: Echocardiogram/I 34.0  Sex:   Male  wt=157    lbs.  BP=120/66  Height= 67   inches.     TESTS  Imaging: Echocardiogram:  An echocardiogram in (2-d) mode was performed and in Doppler mode with color flow velocity mapping was performed. The aortic valve cusps are abnormal 1.4  cm, flow velocity 0000000  m/s, and systolic calculated mean flow gradient 1  mmHg. Mitral valve diastolic peak flow velocity E .548  m/s and E/A ratio 0.6. Aortic root diameter 4.0   cm. The LVOT internal diameter 3.8  cm and flow velocity was abnormal .0000000  m/s. LV systolic dimension 123XX123 cm, diastolic 123456  cm, posterior wall thickness 1.51   cm, fractional shortening 29.9  %, and EF 58.4 %. IVS thickness 1.49    cm. LA dimension 3.4 cm. Mitral Valve has Trace Regurgitation. Tricuspid Valve has Trace Regurgitation.     ASSESSMENT  Technically adequate study.  Normal chamber sizes.  Normal left ventricular systolic function.  Mild left ventricular hypertrophy with GRADE 3 (restrictive physiology) diastolic dysfunction.  Normal right ventricular systolic function.  Normal right ventricular diastolic function.  Normal left ventricular wall motion.  Normal right ventricular wall motion..  Trace tricuspid regurgitation.  Normal pulmonary artery pressure.  Trace mitral regurgitation.  No pericardial effusion.  Severely dilated Ao root and ascending Ao  Mild LVH.     THERAPY   Referring physician: Dionisio David  Sonographer: Neoma Laming.    Neoma Laming MD  Electronically signed by: Neoma Laming     Date: 04/21/2022 09:45   ASSESSMENT AND  PLAN:    ICD-10-CM   1. Primary hypertension  I10 PCV ECHOCARDIOGRAM COMPLETE    2. Tobacco use  Z72.0     3. Mixed hyperlipidemia  E78.2     4. Aneurysm of ascending aorta without rupture (HCC)  I71.21 PCV ECHOCARDIOGRAM COMPLETE    5. History of cancer chemotherapy  Z92.21 PCV ECHOCARDIOGRAM COMPLETE       Problem List Items Addressed This Visit       Cardiovascular and Mediastinum   Aneurysm of ascending aorta without rupture ([place])    Echo 04/2022 Aortic root diameter 4.0  cm.      Relevant Orders   PCV ECHOCARDIOGRAM COMPLETE   Primary hypertension - Primary    Well controlled. Continue same medications. Echo to check heart function after being on chemo.      Relevant Orders   PCV ECHOCARDIOGRAM COMPLETE     Other   Tobacco use   Mixed hyperlipidemia    LDL well controlled on Nexletol and simvastatin.       History of cancer chemotherapy   Relevant Orders   PCV ECHOCARDIOGRAM COMPLETE     Disposition:   No follow-ups on file.    Total time spent: 30 minutes  Signed,  Neoma Laming, MD  02/08/2023 12:42 [place]

## 2023-02-08 NOTE — Assessment & Plan Note (Signed)
LDL well controlled on Nexletol and simvastatin.

## 2023-02-09 ENCOUNTER — Other Ambulatory Visit: Payer: Self-pay | Admitting: Internal Medicine

## 2023-02-09 ENCOUNTER — Encounter: Payer: Self-pay | Admitting: Oncology

## 2023-02-09 LAB — COMPREHENSIVE METABOLIC PANEL
ALT: 41 IU/L (ref 0–44)
AST: 62 IU/L — ABNORMAL HIGH (ref 0–40)
Albumin/Globulin Ratio: 1.6 (ref 1.2–2.2)
Albumin: 4.2 g/dL (ref 3.8–4.8)
Alkaline Phosphatase: 138 IU/L — ABNORMAL HIGH (ref 44–121)
BUN/Creatinine Ratio: 26 — ABNORMAL HIGH (ref 10–24)
BUN: 31 mg/dL — ABNORMAL HIGH (ref 8–27)
Bilirubin Total: 0.6 mg/dL (ref 0.0–1.2)
CO2: 19 mmol/L — ABNORMAL LOW (ref 20–29)
Calcium: 9.1 mg/dL (ref 8.6–10.2)
Chloride: 103 mmol/L (ref 96–106)
Creatinine, Ser: 1.2 mg/dL (ref 0.76–1.27)
Globulin, Total: 2.7 g/dL (ref 1.5–4.5)
Glucose: 114 mg/dL — ABNORMAL HIGH (ref 70–99)
Potassium: 4.3 mmol/L (ref 3.5–5.2)
Sodium: 137 mmol/L (ref 134–144)
Total Protein: 6.9 g/dL (ref 6.0–8.5)
eGFR: 64 mL/min/{1.73_m2} (ref >59)

## 2023-02-09 LAB — CBC WITH DIFFERENTIAL/PLATELET
Basophils Absolute: 0 10*3/uL (ref 0.0–0.2)
Basos: 0 %
EOS (ABSOLUTE): 0 10*3/uL (ref 0.0–0.4)
Eos: 0 %
Hematocrit: 41 % (ref 37.5–51.0)
Hemoglobin: 14.2 g/dL (ref 13.0–17.7)
Immature Grans (Abs): 0 10*3/uL (ref 0.0–0.1)
Immature Granulocytes: 0 %
Lymphocytes Absolute: 0.4 10*3/uL — ABNORMAL LOW (ref 0.7–3.1)
Lymphs: 5 %
MCH: 33 pg (ref 26.6–33.0)
MCHC: 34.6 g/dL (ref 31.5–35.7)
MCV: 95 fL (ref 79–97)
Monocytes Absolute: 0.4 10*3/uL (ref 0.1–0.9)
Monocytes: 5 %
Neutrophils Absolute: 7.4 10*3/uL — ABNORMAL HIGH (ref 1.4–7.0)
Neutrophils: 90 %
Platelets: 112 10*3/uL — ABNORMAL LOW (ref 150–450)
RBC: 4.3 x10E6/uL (ref 4.14–5.80)
RDW: 13.7 % (ref 11.6–15.4)
WBC: 8.2 10*3/uL (ref 3.4–10.8)

## 2023-02-09 LAB — LIPID PANEL W/O CHOL/HDL RATIO
Cholesterol, Total: 142 mg/dL (ref 100–199)
HDL: 46 mg/dL (ref >39)
LDL Chol Calc (NIH): 73 mg/dL (ref 0–99)
Triglycerides: 128 mg/dL (ref 0–149)
VLDL Cholesterol Cal: 23 mg/dL (ref 5–40)

## 2023-02-09 LAB — CK: Total CK: 34 U/L — ABNORMAL LOW (ref 41–331)

## 2023-02-14 ENCOUNTER — Ambulatory Visit: Payer: Medicare HMO | Admitting: Oncology

## 2023-02-14 ENCOUNTER — Ambulatory Visit (INDEPENDENT_AMBULATORY_CARE_PROVIDER_SITE_OTHER): Payer: Medicare HMO

## 2023-02-14 ENCOUNTER — Other Ambulatory Visit: Payer: Medicare HMO

## 2023-02-14 ENCOUNTER — Ambulatory Visit: Payer: Medicare HMO

## 2023-02-14 DIAGNOSIS — R0782 Intercostal pain: Secondary | ICD-10-CM

## 2023-02-15 DIAGNOSIS — C189 Malignant neoplasm of colon, unspecified: Secondary | ICD-10-CM | POA: Diagnosis not present

## 2023-02-16 ENCOUNTER — Telehealth: Payer: Self-pay

## 2023-02-16 ENCOUNTER — Encounter: Payer: Self-pay | Admitting: Oncology

## 2023-02-16 ENCOUNTER — Other Ambulatory Visit (HOSPITAL_COMMUNITY): Payer: Self-pay

## 2023-02-16 ENCOUNTER — Telehealth: Payer: Self-pay | Admitting: Pharmacist

## 2023-02-16 NOTE — Telephone Encounter (Signed)
Oral Oncology Pharmacist Encounter  Received new prescription for Lonsurf (trifluridine/tipiracil) for the treatment of metastatic colon cancer in conjunction with bevacizumab, planned duration until disease progression or unacceptable drug toxicity. Planned start after his 02/28/23 office visit.   CMP from 02/08/23 assessed, no relevant lab abnormalities. Prescription dose and frequency assessed.   Current medication list in Epic reviewed, no DDIs with Lonsurf identified:  Evaluated chart and no patient barriers to medication adherence identified.   Prescription has been e-scribed to the [facility] for benefits analysis and approval.  Oral Oncology Clinic will continue to follow for insurance authorization, copayment issues, initial counseling and start date.   Darl Pikes, PharmD, BCPS, BCOP, CPP Hematology/Oncology Clinical Pharmacist Practitioner [place]/DB/AP [facility] [phone]  02/16/2023 1:02 PM

## 2023-02-16 NOTE — Telephone Encounter (Addendum)
Oral Oncology Patient Advocate Encounter  Prior Authorization for Eric Simon has been approved.    PA# [id number]  Effective dates: 12/13/22 through 08/19/23  Patients co-pay is $0.00. *Deductible has been met* - per insurance test claim.    Berdine Addison, [facility] Patient [place]  [phone] (phone) [phone] (fax) 02/16/2023 1:27 PM

## 2023-02-16 NOTE — Telephone Encounter (Signed)
Oral Oncology Patient Advocate Encounter  New authorization   Received notification that prior authorization for Lonsurf is required.   PA submitted on 02/16/23  [place] [id number]  Status is pending     Berdine Addison, [place] Patient [place]  [phone] (phone) [phone] (fax) 02/16/2023 1:03 PM

## 2023-02-17 ENCOUNTER — Other Ambulatory Visit: Payer: Self-pay | Admitting: Oncology

## 2023-02-17 ENCOUNTER — Other Ambulatory Visit (HOSPITAL_COMMUNITY): Payer: Self-pay

## 2023-02-17 DIAGNOSIS — C189 Malignant neoplasm of colon, unspecified: Secondary | ICD-10-CM

## 2023-02-19 ENCOUNTER — Other Ambulatory Visit: Payer: Self-pay | Admitting: Internal Medicine

## 2023-02-21 ENCOUNTER — Other Ambulatory Visit: Payer: Self-pay | Admitting: Oncology

## 2023-02-21 ENCOUNTER — Other Ambulatory Visit (HOSPITAL_COMMUNITY): Payer: Self-pay

## 2023-02-21 DIAGNOSIS — C189 Malignant neoplasm of colon, unspecified: Secondary | ICD-10-CM

## 2023-02-22 ENCOUNTER — Other Ambulatory Visit (HOSPITAL_COMMUNITY): Payer: Self-pay

## 2023-02-22 MED ORDER — CAPECITABINE 500 MG PO TABS
500.0000 mg | ORAL_TABLET | Freq: Two times a day (BID) | ORAL | 0 refills | Status: DC
  Filled 2023-02-22: qty 28, 21d supply, fill #0

## 2023-02-24 ENCOUNTER — Other Ambulatory Visit: Payer: Medicare HMO

## 2023-02-25 MED FILL — Dexamethasone Sodium Phosphate Inj 100 MG/10ML: INTRAMUSCULAR | Qty: 1 | Status: AC

## 2023-02-28 ENCOUNTER — Encounter: Payer: Self-pay | Admitting: Oncology

## 2023-02-28 ENCOUNTER — Inpatient Hospital Stay: Payer: Medicare HMO | Attending: Oncology | Admitting: Oncology

## 2023-02-28 ENCOUNTER — Inpatient Hospital Stay: Payer: Medicare HMO

## 2023-02-28 ENCOUNTER — Ambulatory Visit: Payer: Medicare HMO

## 2023-02-28 ENCOUNTER — Inpatient Hospital Stay: Payer: Medicare HMO | Admitting: Pharmacist

## 2023-02-28 VITALS — BP 112/63 | HR 80 | Temp 97.3°F | Resp 19 | Wt 161.1 lb

## 2023-02-28 DIAGNOSIS — Z7189 Other specified counseling: Secondary | ICD-10-CM

## 2023-02-28 DIAGNOSIS — C772 Secondary and unspecified malignant neoplasm of intra-abdominal lymph nodes: Secondary | ICD-10-CM

## 2023-02-28 DIAGNOSIS — C189 Malignant neoplasm of colon, unspecified: Secondary | ICD-10-CM | POA: Diagnosis not present

## 2023-02-28 LAB — CBC WITH DIFFERENTIAL/PLATELET
Abs Immature Granulocytes: 0.03 10*3/uL (ref 0.00–0.07)
Basophils Absolute: 0 10*3/uL (ref 0.0–0.1)
Basophils Relative: 1 %
Eosinophils Absolute: 0.2 10*3/uL (ref 0.0–0.5)
Eosinophils Relative: 5 %
HCT: 37.2 % — ABNORMAL LOW (ref 39.0–52.0)
Hemoglobin: 12.6 g/dL — ABNORMAL LOW (ref 13.0–17.0)
Immature Granulocytes: 1 %
Lymphocytes Relative: 17 %
Lymphs Abs: 0.7 10*3/uL (ref 0.7–4.0)
MCH: 32.4 pg (ref 26.0–34.0)
MCHC: 33.9 g/dL (ref 30.0–36.0)
MCV: 95.6 fL (ref 80.0–100.0)
Monocytes Absolute: 0.5 10*3/uL (ref 0.1–1.0)
Monocytes Relative: 11 %
Neutro Abs: 2.7 10*3/uL (ref 1.7–7.7)
Neutrophils Relative %: 65 %
Platelets: 105 10*3/uL — ABNORMAL LOW (ref 150–400)
RBC: 3.89 MIL/uL — ABNORMAL LOW (ref 4.22–5.81)
RDW: 14.7 % (ref 11.5–15.5)
WBC: 4.2 10*3/uL (ref 4.0–10.5)
nRBC: 0 % (ref 0.0–0.2)

## 2023-02-28 LAB — COMPREHENSIVE METABOLIC PANEL
ALT: 13 U/L (ref 0–44)
AST: 27 U/L (ref 15–41)
Albumin: 3.8 g/dL (ref 3.5–5.0)
Alkaline Phosphatase: 101 U/L (ref 38–126)
Anion gap: 8 (ref 5–15)
BUN: 26 mg/dL — ABNORMAL HIGH (ref 8–23)
CO2: 23 mmol/L (ref 22–32)
Calcium: 8.6 mg/dL — ABNORMAL LOW (ref 8.9–10.3)
Chloride: 107 mmol/L (ref 98–111)
Creatinine, Ser: 1.01 mg/dL (ref 0.61–1.24)
GFR, Estimated: >60 mL/min (ref >60)
Glucose, Bld: 103 mg/dL — ABNORMAL HIGH (ref 70–99)
Potassium: 4.1 mmol/L (ref 3.5–5.1)
Sodium: 138 mmol/L (ref 135–145)
Total Bilirubin: 0.6 mg/dL (ref 0.3–1.2)
Total Protein: 7.1 g/dL (ref 6.5–8.1)

## 2023-02-28 LAB — MAGNESIUM: Magnesium: 1.2 mg/dL — ABNORMAL LOW (ref 1.7–2.4)

## 2023-02-28 NOTE — Progress Notes (Signed)
Hematology/Oncology Consult note [facility]  Telephone:([phone] [phone]  Patient Care Team: Jodi Marble, MD as PCP - General (Internal Medicine) Clent Jacks, RN as Oncology Nurse Navigator Sindy Guadeloupe, MD as Consulting Physician (Hematology and Oncology)   Name of the patient: Eric Simon  [id number]  [birth year]   Date of visit: 02/28/23  Diagnosis-metastatic colon cancer with lymph node metastases  Chief complaint/ Reason for visit-discussed CT scan results and further management  Heme/Onc history: Patient is a 72 yr old male with >40 pack year history of smoking. He currently smokes 0.5ppd. he presented to the ER with symptoms of left sided chest pain and left arm pain. Troponin was negative ekg was unremarkable. Ct chest showed no PE. He was incidentally noted to have mediastinal and retrocrural adenopathy and a 2.1X2.3X4.4 cm retroaortic soft tissue lesion in the posterior left chest. 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  Low-grade activity in the left lateral fifth and sixth ribs associated with nondisplaced fractures likely reflecting healing response problem malignancy.   Patient underwent CT-guided biopsy of the retroperitoneal lymph node pathology showed metastatic adenocarcinoma compatible with colorectal origin.  CK7 negative.  CK20 positive.  CDX 2+.  TTF-1 negative.  PSA negative.  This pattern of immunoreactivity supports the above diagnosis. Patient underwent colonoscopy which showed sigmoid mass that was consistent with adenocarcinoma.  RAS panel testing showed that he was wild-type for both K-ras and BRAF   FOLFOX and bevacizumab chemotherapy started in July 2020.  Subsequently oxaliplatin has been on hold given neuropathy.  He was switched to irinotecan Xeloda panitumumab regimen in August 2022.   Scans in October 2023  have shown slow increase in the size of intra-abdominal adenopathy but patient continues to have low-volume disease.  Patient received palliative radiation therapy to the enlarging external iliac lymph nodes.  Repeat lymph node biopsy could not be obtained due to location for foundation 1 testing.  Peripheral blood NGS testing did not show any actionable mutations    Interval history-patient is doing well and denies any complaints presently.  ECOG PS- 0 Pain scale- 0 Opioid associated constipation- no  Review of systems- Review of Systems  Constitutional:  Negative for chills, fever, malaise/fatigue and weight loss.  HENT:  Negative for congestion, ear discharge and nosebleeds.   Eyes:  Negative for blurred vision.  Respiratory:  Negative for cough, hemoptysis, sputum production, shortness of breath and wheezing.   Cardiovascular:  Negative for chest pain, palpitations, orthopnea and claudication.  Gastrointestinal:  Negative for abdominal pain, blood in stool, constipation, diarrhea, heartburn, melena, nausea and vomiting.  Genitourinary:  Negative for dysuria, flank pain, frequency, hematuria and urgency.  Musculoskeletal:  Negative for back pain, joint pain and myalgias.  Skin:  Negative for rash.  Neurological:  Negative for dizziness, tingling, focal weakness, seizures, weakness and headaches.  Endo/Heme/Allergies:  Does not bruise/bleed easily.  Psychiatric/Behavioral:  Negative for depression and suicidal ideas. The patient does not have insomnia.       No Known Allergies   Past Medical History:  Diagnosis Date   Colon cancer ([place])    COPD (chronic obstructive pulmonary disease) ([place])    Hyperlipidemia      Past Surgical History:  Procedure Laterality Date   COLONOSCOPY WITH PROPOFOL N/A 06/26/2019   Procedure: COLONOSCOPY WITH PROPOFOL;  Surgeon: [name], MD;  Location[facility] ENDOSCOPY;  Service: Gastroenterology;  Laterality: N/A;    ESOPHAGOGASTRODUODENOSCOPY (EGD) WITH PROPOFOL N/A 01/12/2023   Procedure: ESOPHAGOGASTRODUODENOSCOPY (EGD) WITH PROPOFOL;  Surgeon: [name], MD;  Location: [facility] ENDOSCOPY;  Service: Gastroenterology;  Laterality: N/A;   FLEXIBLE SIGMOIDOSCOPY N/A 05/07/2021   Procedure: FLEXIBLE SIGMOIDOSCOPY;  Surgeon: [name], MD;  Location: [facility] ENDOSCOPY;  Service: Gastroenterology;  Laterality: N/A;   PORTA CATH INSERTION N/A 06/25/2019   Procedure: PORTA CATH INSERTION;  Surgeon: Algernon Huxley, MD;  Location: [place] CV LAB;  Service: Cardiovascular;  Laterality: N/A;   PORTA CATH INSERTION Left 08/11/2020   Procedure: PORTA CATH INSERTION;  Surgeon: Algernon Huxley, MD;  Location: [place] CV LAB;  Service: Cardiovascular;  Laterality: Left;   PORTA CATH REMOVAL Right 08/11/2020   Procedure: PORTA CATH REMOVAL;  Surgeon: Algernon Huxley, MD;  Location: [place] CV LAB;  Service: Cardiovascular;  Laterality: Right;    Social History   Socioeconomic History   Marital status: Single    Spouse name: Not on file   Number of children: Not on file   Years of education: Not on file   Highest education level: Not on file  Occupational History   Not on file  Tobacco Use   Smoking status: Every Day    Packs/day: 0.50    Years: 40.00    Additional pack years: 0.00    Total pack years: 20.00    Types: Cigarettes   Smokeless tobacco: Never   Tobacco comments:    smoking 40 years  Vaping Use   Vaping Use: Never used  Substance and Sexual Activity   Alcohol use: No   Drug use: No   Sexual activity: Not Currently  Other Topics Concern   Not on file  Social History Narrative   Not on file   Social Determinants of Health   Financial Resource Strain: Low Risk  (06/12/2019)   Overall Financial Resource Strain (CARDIA)    Difficulty of Paying Living Expenses: Not hard at all  Food Insecurity: No Food Insecurity (06/12/2019)   Hunger Vital Sign    Worried About Running Out of Food in the  Last Year: Never true    Ran Out of Food in the Last Year: Never true  Transportation Needs: No Transportation Needs (06/12/2019)   PRAPARE - Hydrologist (Medical): No    Lack of Transportation (Non-Medical): No  Physical Activity: Not on file  Stress: No Stress Concern Present (06/12/2019)   [place]    Feeling of Stress : Not at all  Social Connections: Unknown (06/12/2019)   Social Connection and Isolation Panel [NHANES]    Frequency of Communication with Friends and Family: More than three times a week    Frequency of Social Gatherings with Friends and Family: Not on file    Attends Religious Services: Not on file    Active Member of Clubs or Organizations: Not on file    Attends Archivist Meetings: Not on file    Marital Status: Not on file  Intimate Partner Violence: Not At Risk (06/12/2019)   Humiliation, Afraid, Rape, and Kick questionnaire    Fear of Current or Ex-Partner: No    Emotionally Abused: No    Physically Abused: No    Sexually Abused: No    Family History  Problem Relation Age of Onset   Brain cancer Father      Current Outpatient Medications:  aspirin EC 81 MG tablet, Take 81 mg by mouth daily., Disp: , Rfl:    azelastine (ASTELIN) 0.1 % nasal spray, Place 1 spray into both nostrils daily as needed., Disp: , Rfl:    budesonide-formoterol (SYMBICORT) 80-4.5 MCG/ACT inhaler, USE 2 INHALATION BY MOUTH TWICE DAILY, Disp: 10.2 g, Rfl: 2   capecitabine (XELODA) 500 MG tablet, Take 1 tablet (500 mg total) by mouth 2 (two) times daily after a meal. Take for 14 days, then hold for 7 days. Repeat every 21 days., Disp: 28 tablet, Rfl: 0   cetirizine (ZYRTEC) 10 MG tablet, Take 1 tablet (10 mg total) by mouth daily., Disp: 30 tablet, Rfl: 0   chlorthalidone (HYGROTON) 25 MG tablet, TAKE 1/2 TABLET BY MOUTH EVERY DAY AS DIRECTED, Disp: 90 tablet, Rfl: 1    cyclobenzaprine (FLEXERIL) 10 MG tablet, TAKE 1 TABLET(10 MG) BY MOUTH THREE TIMES DAILY AS NEEDED FOR MUSCLE SPASMS, Disp: 42 tablet, Rfl: 0   dexlansoprazole (DEXILANT) 60 MG capsule, Take 1 capsule (60 mg total) by mouth every morning., Disp: 90 capsule, Rfl: 1   DULoxetine (CYMBALTA) 30 MG capsule, Take 1 capsule (30 mg total) by mouth 2 (two) times daily., Disp: 60 capsule, Rfl: 1   fluticasone (FLONASE) 50 MCG/ACT nasal spray, Place 2 sprays into both nostrils daily as needed., Disp: , Rfl:    lidocaine-prilocaine (EMLA) cream, Apply 1 Application topically as needed (pt will put 1 inch of cream over port 1 hourfor each treatment, cover plastic over the cream protect clothes)., Disp: 30 g, Rfl: 3   lisinopril (ZESTRIL) 5 MG tablet, TAKE 1 TABLET BY MOUTH EVERY DAY, Disp: 90 tablet, Rfl: 1   montelukast (SINGULAIR) 10 MG tablet, Take 10 mg by mouth at bedtime., Disp: , Rfl:    NEXLETOL 180 MG TABS, Take 1 tablet (180 mg total) by mouth daily., Disp: 90 tablet, Rfl: 1   pregabalin (LYRICA) 75 MG capsule, Take lyrica 1 tablet in and and then 2 tablets at night, Disp: 90 capsule, Rfl: 2   simvastatin (ZOCOR) 20 MG tablet, TAKE 1 TABLET BY MOUTH EVERY EVENING, Disp: 90 tablet, Rfl: 1   tamsulosin (FLOMAX) 0.4 MG CAPS capsule, Take 0.4 mg by mouth. , Disp: , Rfl:    allopurinol (ZYLOPRIM) 100 MG tablet, TAKE 1 TABLET BY MOUTH EVERY DAY, Disp: 90 tablet, Rfl: 1 No current facility-administered medications for this visit.  Facility-Administered Medications Ordered in Other Visits:    atropine 1 MG/ML injection, , , ,    heparin lock flush 100 UNIT/ML injection, , , ,   Physical exam:  Vitals:   02/28/23 0847  BP: 112/63  Pulse: 80  Resp: 19  Temp: (!) 97.3 F (36.3 C)  SpO2: 98%  Weight: 161 lb 1.6 oz (73.1 kg)   Physical Exam Cardiovascular:     Rate and Rhythm: Normal rate and regular rhythm.     Heart sounds: Normal heart sounds.  Pulmonary:     Effort: Pulmonary effort is normal.      Breath sounds: Normal breath sounds.  Abdominal:     General: Bowel sounds are normal.     Palpations: Abdomen is soft.  Skin:    General: Skin is warm and dry.  Neurological:     Mental Status: He is alert and oriented to person, place, and time.         Latest Ref Rng & Units 02/28/2023    8:21 AM  CMP  Glucose 70 - 99 mg/dL 103  BUN 8 - 23 mg/dL 26   Creatinine 0.61 - 1.24 mg/dL 1.01   Sodium 135 - 145 mmol/L 138   Potassium 3.5 - 5.1 mmol/L 4.1   Chloride 98 - 111 mmol/L 107   CO2 22 - 32 mmol/L 23   Calcium 8.9 - 10.3 mg/dL 8.6   Total Protein 6.5 - 8.1 g/dL 7.1   Total Bilirubin 0.3 - 1.2 mg/dL 0.6   Alkaline Phos 38 - 126 U/L 101   AST 15 - 41 U/L 27   ALT 0 - 44 U/L 13       Latest Ref Rng & Units 02/28/2023    8:21 AM  CBC  WBC 4.0 - 10.5 K/uL 4.2   Hemoglobin 13.0 - 17.0 g/dL 12.6   Hematocrit 39.0 - 52.0 % 37.2   Platelets 150 - 400 K/uL 105     No images are attached to the encounter.  CT CHEST ABDOMEN PELVIS W CONTRAST  Result Date: 02/01/2023 CLINICAL DATA:  Metastatic colon cancer restaging, ongoing chemotherapy * Tracking Code: BO * EXAM: CT CHEST, ABDOMEN, AND PELVIS WITH CONTRAST TECHNIQUE: Multidetector CT imaging of the chest, abdomen and pelvis was performed following the standard protocol during bolus administration of intravenous contrast. RADIATION DOSE REDUCTION: This exam was performed according to the departmental dose-optimization program which includes automated exposure control, adjustment of the mA and/or kV according to patient size and/or use of iterative reconstruction technique. CONTRAST:  169mL OMNIPAQUE IOHEXOL 300 MG/ML SOLN additional oral enteric contrast COMPARISON:  CT chest abdomen pelvis, 09/17/2022, PET-CT, 07/12/2022 FINDINGS: CT CHEST FINDINGS Cardiovascular: Aortic atherosclerosis. Normal heart size. Left and right coronary artery calcifications. No pericardial effusion. Mediastinum/Nodes: No enlarged mediastinal, hilar,  or axillary lymph nodes. Thyroid gland, trachea, and esophagus demonstrate no significant findings. Lungs/Pleura: Severe centrilobular and paraseptal emphysema. Diffuse bilateral bronchial wall thickening. No pleural effusion or pneumothorax. Musculoskeletal: No chest wall abnormality. No acute osseous findings. CT ABDOMEN PELVIS FINDINGS Hepatobiliary: Somewhat coarse contour of the liver. No gallstones, gallbladder wall thickening, or biliary dilatation. Pancreas: Unremarkable. No pancreatic ductal dilatation or surrounding inflammatory changes. Spleen: Splenomegaly, maximum span 15.3 cm. Adrenals/Urinary Tract: Adrenal glands are unremarkable. Kidneys are normal, without renal calculi, solid lesion, or hydronephrosis. Bladder is unremarkable. Stomach/Bowel: Stomach is within normal limits. Appendix appears normal. No evidence of bowel wall thickening, distention, or inflammatory changes. Sigmoid diverticulosis. Vascular/Lymphatic: Aortic atherosclerosis. Interval enlargement an aortocaval lymph node, now measuring 2.1 x 1.6 cm, previously 1.5 x 1.2 cm (series 2, image 69). Interval enlargement of a small lymph node overlying the right diaphragmatic crus measuring 1.1 x 0.9 cm, previously 0.8 x 0.6 cm (series 2, image 62). Interval decrease in size of a left external iliac lymph node measuring 1.2 x 0.7 cm, previously 2.2 x 1.4 cm (series 2, image 101). Unchanged left pelvic sidewall lymph node measuring 1.2 x 1.0 cm (2, image 105). Reproductive: Mild prostatomegaly. Other: Small, fat containing umbilical hernia.  No ascites. Musculoskeletal: No acute osseous findings. IMPRESSION: 1. Interval enlargement of an aortocaval lymph node, now measuring 2.1 x 1.6 cm, previously 1.5 x 1.2 cm. Interval enlargement of a small lymph node overlying the right diaphragmatic crus measuring 1.1 x 0.9 cm, previously 0.8 x 0.6 cm. Although not previously FDG avid, enlargement of these nodes is worrisome for new nodal metastatic  disease. 2. Interval decrease in size of a left external iliac lymph node and unchanged left pelvic sidewall lymph nodes, consistent with sustained treatment response these nodes previously FDG avid.  3. No other evidence of lymphadenopathy or organ metastatic disease in the chest, abdomen, or pelvis. 4. Somewhat coarse contour of the liver, again suggestive of cirrhosis. Splenomegaly. 5. Severe emphysema. 6. Coronary artery disease. Aortic Atherosclerosis (ICD10-I70.0) and Emphysema (ICD10-J43.9). Electronically Signed   By: Delanna Ahmadi M.D.   On: 02/01/2023 15:43     Assessment and plan- Patient is a 73 y.o. male with metastatic colon cancer with lymph node metastases.  He is here to discuss CT scan results and further management  Patient was diagnosed with stage IV colon cancer and lymph node metastases in July 2020 and was started on FOLFOX Avastin regimen.  Oxaliplatin was dropped later due to neuropathy.  He was switched to Xeloda irinotecan, panitumumab regimen in August 2022 upon progression of intra-abdominal adenopathy.  He was on this regimen up until February 2024.  Scans showed increase in the size of external iliac adenopathy along with rise in his CEA and he underwent palliative radiation to this area.  I have reviewed his recent CT chest abdomen and pelvis images independently and discussed findings with the patient which shows decreased size of the external iliac lymph nodes where he received radiation.However there is interval enlargement of aortocaval adenopathy as well as adenopathy has progressed in the area of right diaphragmatic crus.  CEA has also shown a mild increase in February 2024.  Overall findings are concerning for disease progression.  We discussed [facility] opinion at [facility] versus switching to Lonsurf and Avastin regimen.  Frankey Poot has been approved by his insurance but we have not dispensed the drug yet.  Based on initial data from a phase II trial, an open-label phase III trial  (SUNLIGHT) was conducted to evaluate the addition of bevacizumab to trifluridine-tipiracil in 492 patients with mCRC who progressed on one to two lines of systemic therapy [160]. Most patients had received prior therapy with bevacizumab (72 percent) and had RAS-mutated tumors (70 percent). Patients were randomly assigned to either trifluridine-tipiracil plus bevacizumab or trifluridine-tipiracil alone. At median follow-up of 14 months, the addition of trifluridine-tipiracil to bevacizumab improved OS (median OS 11 versus 8 months, one-year OS 43 versus 30 percent, HR 0.61, 95% CI 0.49-0.77) and PFS (median PFS 6 versus 2 months, one-year PFS HR 0.44, 95% CI 0.36-0.54) . OS and PFS benefit were seen across all prespecified subgroups, including patients previously treated with bevacizumab.   I have asked him to hold his Xeloda at this time and he will see Dr. Oralia Rud at [facility].  Follow-up with me to be decided based on that. Lonsurf would be given at 35 mg/m day 1 to day 5 and day to day 12 of her 28-day cycle.   Visit Diagnosis 1. Goals of care, counseling/discussion   2. 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 ([place])      Dr. Randa Evens, MD, MPH [facility] [id number] 02/28/2023 12:27 PM

## 2023-02-28 NOTE — Progress Notes (Signed)
Patient not seen today, plan to start Arbutus on hold until second opinion appt

## 2023-03-01 ENCOUNTER — Telehealth: Payer: Self-pay

## 2023-03-01 NOTE — Telephone Encounter (Signed)
Referral sent to [facility] to Dr.Jia on 3/18.

## 2023-03-04 ENCOUNTER — Other Ambulatory Visit: Payer: Medicare HMO

## 2023-03-04 ENCOUNTER — Ambulatory Visit: Payer: Medicare HMO | Admitting: Cardiovascular Disease

## 2023-03-07 ENCOUNTER — Other Ambulatory Visit: Payer: Medicare HMO

## 2023-03-07 ENCOUNTER — Ambulatory Visit: Payer: Medicare HMO | Admitting: Oncology

## 2023-03-07 ENCOUNTER — Ambulatory Visit: Payer: Medicare HMO

## 2023-03-07 ENCOUNTER — Ambulatory Visit: Payer: Medicare HMO | Admitting: Cardiovascular Disease

## 2023-03-10 ENCOUNTER — Other Ambulatory Visit: Payer: Self-pay | Admitting: *Deleted

## 2023-03-10 ENCOUNTER — Other Ambulatory Visit (HOSPITAL_COMMUNITY): Payer: Self-pay

## 2023-03-10 DIAGNOSIS — C772 Secondary and unspecified malignant neoplasm of intra-abdominal lymph nodes: Secondary | ICD-10-CM | POA: Diagnosis not present

## 2023-03-10 DIAGNOSIS — C189 Malignant neoplasm of colon, unspecified: Secondary | ICD-10-CM | POA: Diagnosis not present

## 2023-03-14 ENCOUNTER — Other Ambulatory Visit: Payer: Self-pay | Admitting: Oncology

## 2023-03-15 ENCOUNTER — Encounter: Payer: Self-pay | Admitting: Oncology

## 2023-03-16 ENCOUNTER — Ambulatory Visit
Admission: RE | Admit: 2023-03-16 | Discharge: 2023-03-16 | Disposition: A | Discharge status: Home / Self Care | Payer: Medicare HMO | Source: Ambulatory Visit | Attending: Oncology | Admitting: Oncology

## 2023-03-16 DIAGNOSIS — J439 Emphysema, unspecified: Secondary | ICD-10-CM | POA: Insufficient documentation

## 2023-03-16 DIAGNOSIS — I700 Atherosclerosis of aorta: Secondary | ICD-10-CM | POA: Insufficient documentation

## 2023-03-16 DIAGNOSIS — R161 Splenomegaly, not elsewhere classified: Secondary | ICD-10-CM | POA: Insufficient documentation

## 2023-03-16 DIAGNOSIS — I2510 Atherosclerotic heart disease of native coronary artery without angina pectoris: Secondary | ICD-10-CM | POA: Diagnosis not present

## 2023-03-16 DIAGNOSIS — C189 Malignant neoplasm of colon, unspecified: Secondary | ICD-10-CM

## 2023-03-16 DIAGNOSIS — K7460 Unspecified cirrhosis of liver: Secondary | ICD-10-CM | POA: Insufficient documentation

## 2023-03-16 DIAGNOSIS — K5730 Diverticulosis of large intestine without perforation or abscess without bleeding: Secondary | ICD-10-CM | POA: Insufficient documentation

## 2023-03-16 DIAGNOSIS — C19 Malignant neoplasm of rectosigmoid junction: Secondary | ICD-10-CM | POA: Insufficient documentation

## 2023-03-16 DIAGNOSIS — C772 Secondary and unspecified malignant neoplasm of intra-abdominal lymph nodes: Secondary | ICD-10-CM | POA: Diagnosis not present

## 2023-03-16 DIAGNOSIS — C160 Malignant neoplasm of cardia: Secondary | ICD-10-CM | POA: Diagnosis not present

## 2023-03-16 LAB — GLUCOSE, CAPILLARY: Glucose-Capillary: 91 mg/dL (ref 70–99)

## 2023-03-16 MED ORDER — FLUDEOXYGLUCOSE F - 18 (FDG) INJECTION
9.1200 | Freq: Once | INTRAVENOUS | Status: AC | PRN
  Administered 2023-03-16: 9.12 via INTRAVENOUS

## 2023-03-17 ENCOUNTER — Other Ambulatory Visit: Payer: Self-pay | Admitting: Oncology

## 2023-03-17 ENCOUNTER — Other Ambulatory Visit (HOSPITAL_COMMUNITY): Payer: Self-pay

## 2023-03-17 ENCOUNTER — Telehealth: Payer: Self-pay

## 2023-03-17 ENCOUNTER — Encounter: Payer: Self-pay | Admitting: Oncology

## 2023-03-17 DIAGNOSIS — C189 Malignant neoplasm of colon, unspecified: Secondary | ICD-10-CM

## 2023-03-17 NOTE — Telephone Encounter (Signed)
PET images have been shared with [facility] via power share.

## 2023-03-17 NOTE — Progress Notes (Signed)
DISCONTINUE ON PATHWAY REGIMEN - Colorectal     A cycle is every 14 days:     Panitumumab      Irinotecan      Leucovorin      Fluorouracil      Fluorouracil   **Always confirm dose/schedule in your pharmacy ordering system**  REASON: Disease Progression PRIOR TREATMENT: COS84: FOLFIRI + Panitumumab q14 Days TREATMENT RESPONSE: Progressive Disease (PD)  START ON PATHWAY REGIMEN - Colorectal     A cycle is every 28 days:     Trifluridine and tipiracil      Bevacizumab-xxxx   **Always confirm dose/schedule in your pharmacy ordering system**  Patient Characteristics: Distant Metastases, Nonsurgical Candidate, KRAS/NRAS Wild-Type (BRAF V600 Wild-Type/Unknown), Standard Cytotoxic Therapy, Third Line Standard Cytotoxic Therapy, Prior Anti-EGFR Therapy Tumor Location: Colon Therapeutic Status: Distant Metastases Microsatellite/Mismatch Repair Status: MSS/pMMR BRAF Mutation Status: Wild-Type (no mutation) KRAS/NRAS Mutation Status: Wild-Type (no mutation) Preferred Therapy Approach: Standard Cytotoxic Therapy Standard Cytotoxic Line of Therapy: Third Building services engineer Cytotoxic Therapy Intent of Therapy: Non-Curative / Palliative Intent, Discussed with Patient

## 2023-03-18 ENCOUNTER — Other Ambulatory Visit: Payer: Self-pay

## 2023-03-18 ENCOUNTER — Other Ambulatory Visit: Payer: Self-pay | Admitting: Oncology

## 2023-03-18 ENCOUNTER — Other Ambulatory Visit (HOSPITAL_COMMUNITY): Payer: Self-pay

## 2023-03-18 DIAGNOSIS — C189 Malignant neoplasm of colon, unspecified: Secondary | ICD-10-CM

## 2023-03-18 MED ORDER — TRIFLURIDINE-TIPIRACIL 20-8.19 MG PO TABS
35.0000 mg/m2 | ORAL_TABLET | Freq: Two times a day (BID) | ORAL | 3 refills | Status: DC
Start: 2023-03-18 — End: 2023-03-18
  Filled 2023-03-18: qty 20, 3d supply, fill #0

## 2023-03-18 MED ORDER — TRIFLURIDINE-TIPIRACIL 20-8.19 MG PO TABS
35.0000 mg/m2 | ORAL_TABLET | Freq: Two times a day (BID) | ORAL | 3 refills | Status: DC
Start: 2023-03-18 — End: 2023-05-18
  Filled 2023-03-18: qty 60, 28d supply, fill #0
  Filled 2023-03-18: qty 60, 10d supply, fill #0
  Filled 2023-04-11: qty 60, 28d supply, fill #1
  Filled 2023-05-06: qty 60, 28d supply, fill #2

## 2023-03-18 NOTE — Telephone Encounter (Signed)
Scheduled Lonsurf to be shipped to [facility] office on 03/21/23 per MD request. Patient will receive Lonsurf and drug education at appointment on Wednesday, 03/23/23.   Ardeen Fillers, CPhT Oncology Pharmacy Patient Advocate  [facility]  [phone] (phone) [phone] (fax) 03/18/2023 10:29 AM

## 2023-03-21 ENCOUNTER — Other Ambulatory Visit: Payer: Self-pay

## 2023-03-21 ENCOUNTER — Other Ambulatory Visit (HOSPITAL_COMMUNITY): Payer: Self-pay

## 2023-03-22 ENCOUNTER — Inpatient Hospital Stay: Payer: Medicare HMO | Admitting: Oncology

## 2023-03-22 ENCOUNTER — Inpatient Hospital Stay: Payer: Medicare HMO

## 2023-03-23 ENCOUNTER — Inpatient Hospital Stay: Payer: Medicare HMO

## 2023-03-23 ENCOUNTER — Inpatient Hospital Stay: Payer: Medicare HMO | Attending: Oncology

## 2023-03-23 ENCOUNTER — Encounter: Payer: Self-pay | Admitting: Oncology

## 2023-03-23 ENCOUNTER — Inpatient Hospital Stay (HOSPITAL_BASED_OUTPATIENT_CLINIC_OR_DEPARTMENT_OTHER): Payer: Medicare HMO | Admitting: Oncology

## 2023-03-23 ENCOUNTER — Inpatient Hospital Stay (HOSPITAL_BASED_OUTPATIENT_CLINIC_OR_DEPARTMENT_OTHER): Payer: Medicare HMO | Admitting: Pharmacist

## 2023-03-23 VITALS — BP 106/61 | HR 86 | Temp 98.7°F | Resp 16 | Ht 67.0 in | Wt 155.4 lb

## 2023-03-23 DIAGNOSIS — Z5112 Encounter for antineoplastic immunotherapy: Secondary | ICD-10-CM | POA: Insufficient documentation

## 2023-03-23 DIAGNOSIS — C189 Malignant neoplasm of colon, unspecified: Secondary | ICD-10-CM

## 2023-03-23 DIAGNOSIS — Z7189 Other specified counseling: Secondary | ICD-10-CM | POA: Diagnosis not present

## 2023-03-23 DIAGNOSIS — Z79899 Other long term (current) drug therapy: Secondary | ICD-10-CM

## 2023-03-23 DIAGNOSIS — C772 Secondary and unspecified malignant neoplasm of intra-abdominal lymph nodes: Secondary | ICD-10-CM | POA: Diagnosis not present

## 2023-03-23 LAB — CBC WITH DIFFERENTIAL (CANCER CENTER ONLY)
Abs Immature Granulocytes: 0.03 10*3/uL (ref 0.00–0.07)
Basophils Absolute: 0 10*3/uL (ref 0.0–0.1)
Basophils Relative: 1 %
Eosinophils Absolute: 0.2 10*3/uL (ref 0.0–0.5)
Eosinophils Relative: 4 %
HCT: 35.7 % — ABNORMAL LOW (ref 39.0–52.0)
Hemoglobin: 12.3 g/dL — ABNORMAL LOW (ref 13.0–17.0)
Immature Granulocytes: 1 %
Lymphocytes Relative: 16 %
Lymphs Abs: 0.9 10*3/uL (ref 0.7–4.0)
MCH: 31.6 pg (ref 26.0–34.0)
MCHC: 34.5 g/dL (ref 30.0–36.0)
MCV: 91.8 fL (ref 80.0–100.0)
Monocytes Absolute: 0.6 10*3/uL (ref 0.1–1.0)
Monocytes Relative: 10 %
Neutro Abs: 3.8 10*3/uL (ref 1.7–7.7)
Neutrophils Relative %: 68 %
Platelet Count: 155 10*3/uL (ref 150–400)
RBC: 3.89 MIL/uL — ABNORMAL LOW (ref 4.22–5.81)
RDW: 13.7 % (ref 11.5–15.5)
WBC Count: 5.5 10*3/uL (ref 4.0–10.5)
nRBC: 0 % (ref 0.0–0.2)

## 2023-03-23 LAB — PROTEIN, URINE, RANDOM: Total Protein, Urine: 14 mg/dL

## 2023-03-23 MED ORDER — SODIUM CHLORIDE 0.9 % IV SOLN
5.0000 mg/kg | Freq: Once | INTRAVENOUS | Status: DC
Start: 2023-03-23 — End: 2023-03-23

## 2023-03-23 MED ORDER — SODIUM CHLORIDE 0.9 % IV SOLN
Freq: Once | INTRAVENOUS | Status: AC
  Filled 2023-03-23: qty 250

## 2023-03-23 MED ORDER — SODIUM CHLORIDE 0.9 % IV SOLN
5.0000 mg/kg | Freq: Once | INTRAVENOUS | Status: AC
  Administered 2023-03-23: 350 mg via INTRAVENOUS
  Filled 2023-03-23: qty 14

## 2023-03-23 MED ORDER — HEPARIN SOD (PORK) LOCK FLUSH 100 UNIT/ML IV SOLN
500.0000 [IU] | Freq: Once | INTRAVENOUS | Status: AC | PRN
  Administered 2023-03-23: 500 [IU]
  Filled 2023-03-23: qty 5

## 2023-03-23 NOTE — Patient Instructions (Signed)
[place] [facility]  Discharge Instructions: Thank you for choosing [facility] to provide your oncology and hematology care.  If you have a lab appointment with the Cancer Center, please go directly to the Cancer Center and check in at the registration area.  Wear comfortable clothing and clothing appropriate for easy access to any Portacath or PICC line.   We strive to give you quality time with your provider. You may need to reschedule your appointment if you arrive late (15 or more minutes).  Arriving late affects you and other patients whose appointments are after yours.  Also, if you miss three or more appointments without notifying the office, you may be dismissed from the clinic at the provider's discretion.      For prescription refill requests, have your pharmacy contact our office and allow 72 hours for refills to be completed.    Today you received the following chemotherapy and/or immunotherapy agents MVASI      To help prevent nausea and vomiting after your treatment, we encourage you to take your nausea medication as directed.  BELOW ARE SYMPTOMS THAT SHOULD BE REPORTED IMMEDIATELY: *FEVER GREATER THAN 100.4 F (38 C) OR HIGHER *CHILLS OR SWEATING *NAUSEA AND VOMITING THAT IS NOT CONTROLLED WITH YOUR NAUSEA MEDICATION *UNUSUAL SHORTNESS OF BREATH *UNUSUAL BRUISING OR BLEEDING *URINARY PROBLEMS (pain or burning when urinating, or frequent urination) *BOWEL PROBLEMS (unusual diarrhea, constipation, pain near the anus) TENDERNESS IN MOUTH AND THROAT WITH OR WITHOUT PRESENCE OF ULCERS (sore throat, sores in mouth, or a toothache) UNUSUAL RASH, SWELLING OR PAIN  UNUSUAL VAGINAL DISCHARGE OR ITCHING   Items with * indicate a potential emergency and should be followed up as soon as possible or go to the Emergency Department if any problems should occur.  Please show the CHEMOTHERAPY ALERT CARD or IMMUNOTHERAPY ALERT CARD at check-in to the  Emergency Department and triage nurse.  Should you have questions after your visit or need to cancel or reschedule your appointment, please contact [place] [facility]  [phone] and follow the prompts.  Office hours are 8:00 a.m. to 4:30 p.m. Monday - Friday. Please note that voicemails left after 4:00 p.m. may not be returned until the following business day.  We are closed weekends and major holidays. You have access to a nurse at all times for urgent questions. Please call the main number to the clinic [phone] and follow the prompts.  For any non-urgent questions, you may also contact your provider using MyChart. We now offer e-Visits for anyone 33 and older to request care online for non-urgent symptoms. For details visit [email or web address].   Also download the MyChart app! Go to the app store, search "MyChart", open the app, select [place], and log in with your MyChart username and password.  Bevacizumab Injection What is this medication? BEVACIZUMAB (be va SIZ yoo mab) treats some types of cancer. It works by blocking a protein that causes cancer cells to grow and multiply. This helps to slow or stop the spread of cancer cells. It is a monoclonal antibody. This medicine may be used for other purposes; ask your health care provider or pharmacist if you have questions. COMMON BRAND NAME(S): Alymsys, Avastin, MVASI, Omer Jack What should I tell my care team before I take this medication? They need to know if you have any of these conditions: Blood clots Coughing up blood Having or recent surgery Heart failure High blood pressure History of a connection  between 2 or more body parts that do not usually connect (fistula) History of a tear in your stomach or intestines Protein in your urine An unusual or allergic reaction to bevacizumab, other medications, foods, dyes, or preservatives Pregnant or trying to get pregnant Breast-feeding How should I  use this medication? This medication is injected into a vein. It is given by your care team in a hospital or clinic setting. Talk to your care team the use of this medication in children. Special care may be needed. Overdosage: If you think you have taken too much of this medicine contact a poison control center or emergency room at once. NOTE: This medicine is only for you. Do not share this medicine with others. What if I miss a dose? Keep appointments for follow-up doses. It is important not to miss your dose. Call your care team if you are unable to keep an appointment. What may interact with this medication? Interactions are not expected. This list may not describe all possible interactions. Give your health care provider a list of all the medicines, herbs, non-prescription drugs, or dietary supplements you use. Also tell them if you smoke, drink alcohol, or use illegal drugs. Some items may interact with your medicine. What should I watch for while using this medication? Your condition will be monitored carefully while you are receiving this medication. You may need blood work while taking this medication. This medication may make you feel generally unwell. This is not uncommon as chemotherapy can affect healthy cells as well as cancer cells. Report any side effects. Continue your course of treatment even though you feel ill unless your care team tells you to stop. This medication may increase your risk to bruise or bleed. Call your care team if you notice any unusual bleeding. Before having surgery, talk to your care team to make sure it is ok. This medication can increase the risk of poor healing of your surgical site or wound. You will need to stop this medication for 28 days before surgery. After surgery, wait at least 28 days before restarting this medication. Make sure the surgical site or wound is healed enough before restarting this medication. Talk to your care team if questions. Talk  to your care team if you may be pregnant. Serious birth defects can occur if you take this medication during pregnancy and for 6 months after the last dose. Contraception is recommended while taking this medication and for 6 months after the last dose. Your care team can help you find the option that works for you. Do not breastfeed while taking this medication and for 6 months after the last dose. This medication can cause infertility. Talk to your care team if you are concerned about your fertility. What side effects may I notice from receiving this medication? Side effects that you should report to your care team as soon as possible: Allergic reactions--skin rash, itching, hives, swelling of the face, lips, tongue, or throat Bleeding--bloody or black, tar-like stools, vomiting blood or brown material that looks like coffee grounds, red or dark brown urine, small red or purple spots on skin, unusual bruising or bleeding Blood clot--pain, swelling, or warmth in the leg, shortness of breath, chest pain Heart attack--pain or tightness in the chest, shoulders, arms, or jaw, nausea, shortness of breath, cold or clammy skin, feeling faint or lightheaded Heart failure--shortness of breath, swelling of the ankles, feet, or hands, sudden weight gain, unusual weakness or fatigue Increase in blood pressure Infection--fever, chills, cough,  sore throat, wounds that don't heal, pain or trouble when passing urine, general feeling of discomfort or being unwell Infusion reactions--chest pain, shortness of breath or trouble breathing, feeling faint or lightheaded Kidney injury--decrease in the amount of urine, swelling of the ankles, hands, or feet Stomach pain that is severe, does not go away, or gets worse Stroke--sudden numbness or weakness of the face, arm, or leg, trouble speaking, confusion, trouble walking, loss of balance or coordination, dizziness, severe headache, change in vision Sudden and severe  headache, confusion, change in vision, seizures, which may be signs of posterior reversible encephalopathy syndrome (PRES) Side effects that usually do not require medical attention (report to your care team if they continue or are bothersome): Back pain Change in taste Diarrhea Dry skin Increased tears Nosebleed This list may not describe all possible side effects. Call your doctor for medical advice about side effects. You may report side effects to FDA at 1-800-FDA-1088. Where should I keep my medication? This medication is given in a hospital or clinic. It will not be stored at home. NOTE: This sheet is a summary. It may not cover all possible information. If you have questions about this medicine, talk to your doctor, pharmacist, or health care provider.  2023 Elsevier/Gold Standard (2022-04-02 00:00:00)

## 2023-03-23 NOTE — Progress Notes (Signed)
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 He currently smokes 0.5ppd. he presented to the ER with symptoms of left sided chest pain and left arm pain. Troponin was negative ekg was unremarkable. Ct chest showed no PE. He was incidentally noted to have mediastinal and retrocrural adenopathy and a 2.1X2.3X4.4 cm retroaortic soft tissue lesion in the posterior left chest. 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  Low-grade activity in the left lateral fifth and sixth ribs associated with nondisplaced fractures likely reflecting healing response problem malignancy.   Patient underwent CT-guided biopsy of the retroperitoneal lymph node pathology showed metastatic adenocarcinoma compatible with colorectal origin.  CK7 negative.  CK20 positive.  CDX 2+.  TTF-1 negative.  PSA negative.  This pattern of immunoreactivity supports the above diagnosis. Patient underwent colonoscopy which showed sigmoid mass that was consistent with adenocarcinoma.  RAS panel testing showed that he was wild-type for both K-ras and BRAF   FOLFOX and bevacizumab chemotherapy started in July 2020.  Subsequently oxaliplatin has been on hold given neuropathy.  He was switched to irinotecan Xeloda panitumumab regimen in August 2022.   Scans in October 2023  have shown slow increase in the size of intra-abdominal adenopathy but patient continues to have low-volume disease.  Patient received palliative radiation therapy to the enlarging external iliac lymph nodes.  Repeat lymph node biopsy could not be obtained due to location for foundation 1 testing.  Peripheral blood NGS testing did not show any actionable mutations    Interval history-patient is currently doing well and denies any specific complaints at this time  ECOG PS- 0 Pain scale- 0   Review of systems- Review of Systems  Constitutional:  Negative for chills, fever, malaise/fatigue and weight loss.  HENT:  Negative for congestion, ear discharge and nosebleeds.   Eyes:  Negative for blurred vision.  Respiratory:  Negative for cough, hemoptysis, sputum production, shortness of breath and wheezing.   Cardiovascular:  Negative for chest pain, palpitations, orthopnea and claudication.  Gastrointestinal:  Negative for abdominal pain, blood in stool, constipation, diarrhea, heartburn, melena, nausea and vomiting.  Genitourinary:  Negative for dysuria, flank pain, frequency, hematuria and urgency.  Musculoskeletal:  Negative for back pain, joint pain and myalgias.  Skin:  Negative for rash.  Neurological:  Negative for dizziness, tingling, focal weakness, seizures, weakness and headaches.  Endo/Heme/Allergies:  Does not bruise/bleed easily.  Psychiatric/Behavioral:  Negative for depression and suicidal ideas. The patient does not have insomnia.       No Known Allergies   Past Medical History:  Diagnosis Date   Colon cancer    COPD (chronic obstructive pulmonary disease)    Hyperlipidemia      Past Surgical History:  Procedure Laterality Date   COLONOSCOPY WITH PROPOFOL N/A 06/26/2019   Procedure: COLONOSCOPY WITH PROPOFOL;  Surgeon: Wyline Mood, MD;  Location: [facility] ENDOSCOPY;  Service: Gastroenterology;  Laterality: N/A;   ESOPHAGOGASTRODUODENOSCOPY (EGD)  WITH PROPOFOL N/A 01/12/2023   Procedure: ESOPHAGOGASTRODUODENOSCOPY (EGD) WITH PROPOFOL;  Surgeon: Wyline MoodAnna, Kiran, MD;  [facility];  Service: Gastroenterology;  Laterality: N/A;   FLEXIBLE SIGMOIDOSCOPY N/A 05/07/2021   Procedure: FLEXIBLE SIGMOIDOSCOPY;  Surgeon: Wyline MoodAnna, Kiran, MD;  [facility];  Service: Gastroenterology;  Laterality: N/A;   PORTA CATH INSERTION N/A 06/25/2019   Procedure: PORTA CATH INSERTION;  Surgeon: Annice Needyew, Jason S, MD;  Location: [facility] INVASIVE CV LAB;  Service: Cardiovascular;  Laterality: N/A;   PORTA CATH INSERTION Left 08/11/2020   Procedure: PORTA CATH INSERTION;  Surgeon: Annice Needyew, Jason S, MD;  Location: [facility] INVASIVE CV LAB;  Service: Cardiovascular;  Laterality: Left;   PORTA CATH REMOVAL Right 08/11/2020   Procedure: PORTA CATH REMOVAL;  Surgeon: Annice Needyew, Jason S, MD;  Location: [facility] INVASIVE CV LAB;  Service: Cardiovascular;  Laterality: Right;    Social History   Socioeconomic History   Marital status: Single    Spouse name: Not on file   Number of children: Not on file   Years of education: Not on file   Highest education level: Not on file  Occupational History   Not on file  Tobacco Use   Smoking status: Every Day    Packs/day: 0.50    Years: 40.00    Additional pack years: 0.00    Total pack years: 20.00    Types: Cigarettes   Smokeless tobacco: Never   Tobacco comments:    smoking 40 years  Vaping Use   Vaping Use: Never used  Substance and Sexual Activity   Alcohol use: No   Drug use: No   Sexual activity: Not Currently  Other Topics Concern   Not on file  Social History Narrative   Not on file   Social Determinants of Health   Financial Resource Strain: Low Risk  (06/12/2019)   Overall Financial Resource Strain (CARDIA)    Difficulty of Paying Living Expenses: Not hard at all  Food Insecurity: No Food Insecurity (06/12/2019)   Hunger Vital Sign    Worried About Running Out of Food in the Last Year: Never true    Ran Out  of Food in the Last Year: Never true  Transportation Needs: No Transportation Needs (06/12/2019)   PRAPARE - Administrator, Civil ServiceTransportation    Lack of Transportation (Medical): No    Lack of Transportation (Non-Medical): No  Physical Activity: Not on file  Stress: No Stress Concern Present (06/12/2019)   [employer] - Occupational Stress Questionnaire    Feeling of Stress : Not at all  Social Connections: Unknown (06/12/2019)   Social Connection and Isolation Panel [NHANES]    Frequency of Communication with Friends and Family: More than three times a week    Frequency of Social Gatherings with Friends and Family: Not on file    Attends Religious Services: Not on file    Active Member of Clubs or Organizations: Not on file    Attends BankerClub or Organization Meetings: Not on file    Marital Status: Not on file  Intimate Partner Violence: Not At Risk (06/12/2019)   Humiliation, Afraid, Rape, and Kick questionnaire    Fear of Current or Ex-Partner: No    Emotionally Abused: No    Physically Abused: No    Sexually Abused: No    Family History  Problem Relation Age of Onset   Brain cancer Father      Current Outpatient Medications:  aspirin 81 MG chewable tablet, Chew by mouth., Disp: , Rfl:    aspirin EC 81 MG tablet, Take 81 mg by mouth daily., Disp: , Rfl:    azelastine (ASTELIN) 0.1 % nasal spray, Place 1 spray into both nostrils daily as needed., Disp: , Rfl:    budesonide-formoterol (SYMBICORT) 80-4.5 MCG/ACT inhaler, USE 2 INHALATION BY MOUTH TWICE DAILY, Disp: 10.2 g, Rfl: 2   cetirizine (ZYRTEC) 10 MG tablet, Take 1 tablet (10 mg total) by mouth daily., Disp: 30 tablet, Rfl: 0   chlorthalidone (HYGROTON) 25 MG tablet, TAKE 1/2 TABLET BY MOUTH EVERY DAY AS DIRECTED, Disp: 90 tablet, Rfl: 1   cyclobenzaprine (FLEXERIL) 10 MG tablet, TAKE 1 TABLET(10 MG) BY MOUTH THREE TIMES DAILY AS NEEDED FOR MUSCLE SPASMS, Disp: 42 tablet, Rfl: 0   dexlansoprazole (DEXILANT) 60 MG  capsule, Take 1 capsule (60 mg total) by mouth every morning., Disp: 90 capsule, Rfl: 1   dexlansoprazole (DEXILANT) 60 MG capsule, Take by mouth., Disp: , Rfl:    DULoxetine (CYMBALTA) 30 MG capsule, Take 1 capsule (30 mg total) by mouth 2 (two) times daily., Disp: 60 capsule, Rfl: 1   fluticasone (FLONASE) 50 MCG/ACT nasal spray, Place 2 sprays into both nostrils daily as needed., Disp: , Rfl:    lidocaine-prilocaine (EMLA) cream, Apply 1 Application topically as needed (pt will put 1 inch of cream over port 1 hourfor each treatment, cover plastic over the cream protect clothes)., Disp: 30 g, Rfl: 3   lisinopril (ZESTRIL) 5 MG tablet, TAKE 1 TABLET BY MOUTH EVERY DAY, Disp: 90 tablet, Rfl: 1   montelukast (SINGULAIR) 10 MG tablet, Take 10 mg by mouth at bedtime., Disp: , Rfl:    NEXLETOL 180 MG TABS, Take 1 tablet (180 mg total) by mouth daily., Disp: 90 tablet, Rfl: 1   pregabalin (LYRICA) 75 MG capsule, TAKE 1 CAPSULE BY MOUTH EVERY MORNING AND TAKE 2 CAPSULES BY MOUTH AT BEDTIME, Disp: 90 capsule, Rfl: 0   simvastatin (ZOCOR) 20 MG tablet, TAKE 1 TABLET BY MOUTH EVERY EVENING, Disp: 90 tablet, Rfl: 1   tamsulosin (FLOMAX) 0.4 MG CAPS capsule, Take 0.4 mg by mouth. , Disp: , Rfl:    trifluridine-tipiracil (LONSURF) 20-8.19 MG tablet, Take 3 tablets (60 mg of trifluridine total) by mouth 2 (two) times daily. 1hr after AM & PM meals days 1-5, 8-12. Repeat every 28d., Disp: 60 tablet, Rfl: 3 No current facility-administered medications for this visit.  Facility-Administered Medications Ordered in Other Visits:    atropine 1 MG/ML injection, , , ,    heparin lock flush 100 UNIT/ML injection, , , ,   Physical exam:  Vitals:   03/23/23 0903  BP: 106/61  Pulse: 86  Resp: 16  Temp: 98.7 F (37.1 C)  TempSrc: Tympanic  SpO2: 100%  Weight: 155 lb 6.4 oz (70.5 kg)  Height: 5\' 7"  (1.702 m)   Physical Exam Cardiovascular:     Rate and Rhythm: Normal rate and regular rhythm.     Heart sounds:  Normal heart sounds.  Pulmonary:     Effort: Pulmonary effort is normal.     Breath sounds: Normal breath sounds.  Abdominal:     General: Bowel sounds are normal.     Palpations: Abdomen is soft.  Skin:    General: Skin is warm and dry.  Neurological:     Mental Status: He is alert and oriented to person, place, and time.        Latest Ref Rng &  Units 02/28/2023    8:21 AM  CMP  Glucose 70 - 99 mg/dL 892   BUN 8 - 23 mg/dL 26   Creatinine 1.19 - 1.24 mg/dL 4.17   Sodium 408 - 144 mmol/L 138   Potassium 3.5 - 5.1 mmol/L 4.1   Chloride 98 - 111 mmol/L 107   CO2 22 - 32 mmol/L 23   Calcium 8.9 - 10.3 mg/dL 8.6   Total Protein 6.5 - 8.1 g/dL 7.1   Total Bilirubin 0.3 - 1.2 mg/dL 0.6   Alkaline Phos 38 - 126 U/L 101   AST 15 - 41 U/L 27   ALT 0 - 44 U/L 13       Latest Ref Rng & Units 03/23/2023    8:48 AM  CBC  WBC 4.0 - 10.5 K/uL 5.5   Hemoglobin 13.0 - 17.0 g/dL 81.8   Hematocrit 56.3 - 52.0 % 35.7   Platelets 150 - 400 K/uL 155     No images are attached to the encounter.  NM PET Image Restag (PS) Skull Base To Thigh  Result Date: 03/17/2023 CLINICAL DATA:  Subsequent treatment strategy for metastatic colorectal carcinoma. EXAM: NUCLEAR MEDICINE PET SKULL BASE TO THIGH TECHNIQUE: 9.1 mCi F-18 FDG was injected intravenously. Full-ring PET imaging was performed from the skull base to thigh after the radiotracer. CT data was obtained and used for attenuation correction and anatomic localization. Fasting blood glucose: 90 mg/dl COMPARISON:  CT 14/97/0263 com PET-CT 07/12/2022 FINDINGS: Mediastinal blood pool activity: SUV max Liver activity: SUV max NA NECK: No hypermetabolic lymph nodes in the neck. Incidental CT findings: None. CHEST: No hypermetabolic mediastinal or hilar nodes. No suspicious pulmonary nodules on the CT scan. Incidental CT findings: Underlying interstitial thickening at the lung bases. Centrilobular emphysema the upper lobes. ABDOMEN/PELVIS: Interval  developed enlarged hypermetabolic periaortic lymphadenopathy. Largest lymph node measures 29 mm ventral to the aorta (image 101) SUV max equal 17. Approximately 10 additional hypermetabolic lymph nodes along LEFT and RIGHT side of the aorta. Example cluster of nodes LEFT aorta on image 101 SUV max equal 12.9. These nodes measure approximately 10 mm each. Hypermetabolic lymph nodes adjacent to the confluence of the SMV and portal vein on image 90 intensely hypermetabolic. Hypermetabolic nodes extend into the small bowel mesentery centrally and on the RIGHT (image 104) Hypermetabolic LEFT iliac lymph nodes. For example two adjacent LEFT external iliac hypermetabolic nodes anterior to the operator space measure 13 mm and 8 mm with SUV max equal 16 on image 133. Hypermetabolic focus within the sigmoid colon on image 134 with SUV max equal 12. No clear lesion identified on CT portion exam. No evidence of liver metastasis. Incidental CT findings: None. SKELETON: No focal hypermetabolic activity to suggest skeletal metastasis. Incidental CT findings: None. IMPRESSION: 1. Unfortunately there is evidence of metastatic colorectal carcinoma recurrence to abdominopelvic lymph nodes. Hypermetabolic periaortic retroperitoneal nodes and LEFT iliac nodes. 2. Potential colorectal carcinoma recurrence in the proximal sigmoid colon. 3. No evidence of liver metastasis.  No thoracic metastasis Electronically Signed   By: Genevive Bi M.D.   On: 03/17/2023 10:43     Assessment and plan- Patient is a 72 y.o. male with metastatic colon cancer with lymph node metastases.  He is here to discuss further management of colon cancer  I have reviewed PET CT scan images independently and discussed findings with the patient.  PET CT scan shows more disease than what was previously known in February 2024.  He has evidence of  hypermetabolic adenopathy in the periaortic region both to the left and the right of the aorta.  There is also  additional hypermetabolic adenopathy noted at the site of small bowel mesentery, external iliac lymph nodes as well as at the confluence of SMV and portal vein.  All these lymph node regions cannot be encompassed in 1 radiation field and I therefore do not feel that more radiation would be helpful.  Patient has had a second opinion with Dr. Timoteo Gaul at [facility] as well.  There are no clinical trials presently at [facility] for metastatic colon cancer.  Given that he has more areas of lymphadenopathy than what was previously anticipated.  Proceeding with Lonsurf Avastin regimen at this time.  He will start taking his Lonsurf today day 1 to day 5 and day 8 today 13 2 weeks on and 2 weeks off until progression or toxicity.  Discussed risks and benefits of Lonsurf including all but not limited to nausea vomiting low blood counts, risk of infections and hospitalization.  Discussed risks and benefits of a Avastin including all but not limited to hypertension, proteinuria and risk of thromboembolism.  Patient understands and agrees to proceed as planned.  He will proceed with cycle 1 day 1 of a Avastin today.  He will directly proceed for cycle 1 day 15 in 2 weeks and I will see him back in 4 weeks for cycle 2-day 1 of a Avastin   Visit Diagnosis 1. 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   2. Encounter for monoclonal antibody treatment for malignancy   3. High risk medication use   4. Goals of care, counseling/discussion      Dr. Owens Shark, MD, MPH [facility] at [facility] [phone] 03/23/2023 9:40 AM

## 2023-03-23 NOTE — Progress Notes (Signed)
Oral Chemotherapy Clinic [facility]  Telephone:([phone] [phone]  Patient Care Team: [name], MD as PCP - General (Internal Medicine) Benita Gutter, RN as Oncology Nurse Navigator Creig Hines, MD as Consulting Physician (Hematology and Oncology)   Name of the patient: Eric Simon  [id number]  1951-11-17   Date of visit: 03/23/23  HPI: Patient is a 72 y.o. male with progressive metastatic colon cancer. Planned treatment with Lonsurf (trifluridine/tipiracil) and bevacizumab, to start today 03/23/23.  Reason for Consult: Lonsurf oral chemotherapy education.   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  MEDICATIONS:  Current Outpatient Medications  Medication Sig Dispense Refill   aspirin 81 MG chewable tablet Chew by mouth.     aspirin EC 81 MG tablet Take 81 mg by mouth daily.     azelastine (ASTELIN) 0.1 % nasal spray Place 1 spray into both nostrils daily as needed.     budesonide-formoterol (SYMBICORT) 80-4.5 MCG/ACT  inhaler USE 2 INHALATION BY MOUTH TWICE DAILY 10.2 g 2   cetirizine (ZYRTEC) 10 MG tablet Take 1 tablet (10 mg total) by mouth daily. 30 tablet 0   chlorthalidone (HYGROTON) 25 MG tablet TAKE 1/2 TABLET BY MOUTH EVERY DAY AS DIRECTED 90 tablet 1   cyclobenzaprine (FLEXERIL) 10 MG tablet TAKE 1 TABLET(10 MG) BY MOUTH THREE TIMES DAILY AS NEEDED FOR MUSCLE SPASMS 42 tablet 0   dexlansoprazole (DEXILANT) 60 MG capsule Take 1 capsule (60 mg total) by mouth every morning. 90 capsule 1   dexlansoprazole (DEXILANT) 60 MG capsule Take by mouth.     DULoxetine (CYMBALTA) 30 MG capsule Take 1 capsule (30 mg total) by mouth 2 (two) times daily. 60 capsule 1   fluticasone (FLONASE) 50 MCG/ACT nasal spray Place 2 sprays into both nostrils daily as needed.     lidocaine-prilocaine (EMLA) cream Apply 1 Application topically as needed (pt will put 1 inch of cream over port 1 hourfor each treatment, cover plastic over the cream protect clothes). 30 g 3   lisinopril (ZESTRIL) 5 MG tablet TAKE 1 TABLET BY MOUTH EVERY DAY 90 tablet 1   montelukast (SINGULAIR) 10 MG tablet Take 10 mg by mouth at bedtime.     NEXLETOL 180 MG TABS Take 1 tablet (180 mg total) by mouth daily. 90 tablet 1   pregabalin (LYRICA) 75 MG capsule TAKE 1 CAPSULE BY MOUTH EVERY MORNING AND TAKE 2 CAPSULES BY MOUTH AT BEDTIME 90 capsule 0   simvastatin (ZOCOR) 20 MG tablet TAKE 1  TABLET BY MOUTH EVERY EVENING 90 tablet 1   tamsulosin (FLOMAX) 0.4 MG CAPS capsule Take 0.4 mg by mouth.      trifluridine-tipiracil (LONSURF) 20-8.19 MG tablet Take 3 tablets (60 mg of trifluridine total) by mouth 2 (two) times daily. 1hr after AM & PM meals days 1-5, 8-12. Repeat every 28d. 60 tablet 3   No current facility-administered medications for this visit.   Facility-Administered Medications Ordered in Other Visits  Medication Dose Route Frequency Provider Last Rate Last Admin   atropine 1 MG/ML injection            heparin lock flush 100 UNIT/ML injection              VITAL SIGNS: There were no vitals taken for this visit. There were no vitals filed for this visit.  Estimated body mass index is 24.34 kg/m as calculated from the following:   Height as of an earlier encounter on 03/23/23: 5\' 7"  (1.702 m).   Weight as of an earlier encounter on 03/23/23: 70.5 kg (155 lb 6.4 oz).  LABS: CBC:    Component Value Date/Time   WBC 5.5 03/23/2023 0848   WBC 4.2 02/28/2023 0821   HGB 12.3 (L) 03/23/2023 0848   HGB 14.2 02/08/2023 1130   HCT 35.7 (L) 03/23/2023 0848   HCT 41.0 02/08/2023 1130   PLT 155 03/23/2023 0848   PLT 112 (L) 02/08/2023 1130   MCV 91.8 03/23/2023 0848   MCV 95 02/08/2023 1130   MCV 88 08/18/2012 1321   NEUTROABS 3.8 03/23/2023 0848   NEUTROABS 7.4 (H) 02/08/2023 1130   LYMPHSABS 0.9 03/23/2023 0848   LYMPHSABS 0.4 (L) 02/08/2023 1130   MONOABS 0.6 03/23/2023 0848   EOSABS 0.2 03/23/2023 0848   EOSABS 0.0 02/08/2023 1130   BASOSABS 0.0 03/23/2023 0848   BASOSABS 0.0 02/08/2023 1130   Comprehensive Metabolic Panel:    Component Value Date/Time   NA 138 02/28/2023 0821   NA 137 02/08/2023 1130   NA 139 08/18/2012 1321   K 4.1 02/28/2023 0821   K 3.7 08/18/2012 1321   CL 107 02/28/2023 0821   CL 104 08/18/2012 1321   CO2 23 02/28/2023 0821   CO2 27 08/18/2012 1321   BUN 26 (H) 02/28/2023 0821   BUN 31 (H) 02/08/2023 1130   BUN 18 08/18/2012 1321   CREATININE 1.01 02/28/2023 0821   CREATININE 0.82 08/18/2012 1321   GLUCOSE 103 (H) 02/28/2023 0821   GLUCOSE 68 08/18/2012 1321   CALCIUM 8.6 (L) 02/28/2023 0821   CALCIUM 9.0 08/18/2012 1321   AST 27 02/28/2023 0821   AST 36 08/18/2012 1321   ALT 13 02/28/2023 0821   ALT 51 08/18/2012 1321   ALKPHOS 101 02/28/2023 0821   ALKPHOS 88 08/18/2012 1321   BILITOT 0.6 02/28/2023 0821   BILITOT 0.6 02/08/2023 1130   BILITOT 0.4 08/18/2012 1321   PROT 7.1 02/28/2023 0821   PROT 6.9 02/08/2023 1130   PROT 8.0 08/18/2012 1321   ALBUMIN 3.8 02/28/2023 0821    ALBUMIN 4.2 02/08/2023 1130   ALBUMIN 4.1 08/18/2012 1321     Present during today's visit: Patient only, seen in infusion  Start plan: Lonsurf starting today   Patient Education I spoke with patient for overview of new oral chemotherapy medication: Lonsurf (trifluridine/tipiracil)    Administration: Counseled patient on administration, dosing, side effects, monitoring, drug-food interactions, safe handling, storage, and disposal. Patient will take 3 tablets (60 mg of trifluridine total) by mouth 2 (  two) times daily. 1hr after AM & PM meals days 1-5, 8-12. Repeat every 28d.  Side Effects: Side effects include but not limited to: diarrhea, nausea, fatigue, decreased wbc/hgb/plt.    Drug-drug Interactions (DDI): No current DDIs with Lonsurf  Adherence: After discussion with patient no patient barriers to medication adherence identified.  Reviewed with patient importance of keeping a medication schedule and plan for any missed doses.  Mr. Azucena CecilBurton voiced understanding and appreciation. All questions answered. Medication handout provided.  Provided patient with Oral Chemotherapy Navigation Clinic phone number. Patient knows to call the office with questions or concerns. Oral Chemotherapy Navigation Clinic will continue to follow.  Patient expressed understanding and was in agreement with this plan. He also understands that He can call clinic at any time with any questions, concerns, or complaints.   Medication Access Issues: No issues, [facility] fills [facility]  Follow-up plan: RTC in 2 weeks  Thank you for allowing me to participate in the care of this patient.   Time Total: 20 mins  Visit consisted of counseling and education on dealing with issues of symptom management in the setting of serious and potentially life-threatening illness.Greater than 50%  of this time was spent counseling and coordinating care related to the above assessment and plan.  Signed  by: Remi HaggardAlyson N. Ada Woodbury, PharmD, BCPS, Nolon BussingBCOP, CPP Hematology/Oncology Clinical Pharmacist Practitioner [place]/DB/AP Oral Chemotherapy Navigation Clinic [phone]  03/23/2023 9:38 AM

## 2023-03-25 ENCOUNTER — Telehealth: Payer: Self-pay

## 2023-03-25 NOTE — Telephone Encounter (Signed)
Clydie Braun called asksing for call back about PCS services for the patient

## 2023-04-01 NOTE — Telephone Encounter (Signed)
Printed and filled out to be signed by neelam for [employer]

## 2023-04-05 ENCOUNTER — Other Ambulatory Visit: Payer: Medicare HMO

## 2023-04-05 ENCOUNTER — Ambulatory Visit: Payer: Medicare HMO | Admitting: Oncology

## 2023-04-05 ENCOUNTER — Ambulatory Visit: Payer: Medicare HMO

## 2023-04-06 ENCOUNTER — Inpatient Hospital Stay: Payer: Medicare HMO

## 2023-04-06 ENCOUNTER — Ambulatory Visit: Payer: Medicare HMO | Admitting: Oncology

## 2023-04-06 VITALS — BP 101/61 | HR 76 | Temp 97.9°F | Resp 16 | Wt 156.9 lb

## 2023-04-06 DIAGNOSIS — Z5112 Encounter for antineoplastic immunotherapy: Secondary | ICD-10-CM | POA: Diagnosis not present

## 2023-04-06 DIAGNOSIS — C189 Malignant neoplasm of colon, unspecified: Secondary | ICD-10-CM

## 2023-04-06 LAB — CBC WITH DIFFERENTIAL (CANCER CENTER ONLY)
Abs Immature Granulocytes: 0.01 10*3/uL (ref 0.00–0.07)
Basophils Absolute: 0 10*3/uL (ref 0.0–0.1)
Basophils Relative: 1 %
Eosinophils Absolute: 0.1 10*3/uL (ref 0.0–0.5)
Eosinophils Relative: 2 %
HCT: 30.6 % — ABNORMAL LOW (ref 39.0–52.0)
Hemoglobin: 10.6 g/dL — ABNORMAL LOW (ref 13.0–17.0)
Immature Granulocytes: 0 %
Lymphocytes Relative: 25 %
Lymphs Abs: 0.7 10*3/uL (ref 0.7–4.0)
MCH: 31.2 pg (ref 26.0–34.0)
MCHC: 34.6 g/dL (ref 30.0–36.0)
MCV: 90 fL (ref 80.0–100.0)
Monocytes Absolute: 0.1 10*3/uL (ref 0.1–1.0)
Monocytes Relative: 5 %
Neutro Abs: 1.7 10*3/uL (ref 1.7–7.7)
Neutrophils Relative %: 67 %
Platelet Count: 88 10*3/uL — ABNORMAL LOW (ref 150–400)
RBC: 3.4 MIL/uL — ABNORMAL LOW (ref 4.22–5.81)
RDW: 13.3 % (ref 11.5–15.5)
WBC Count: 2.6 10*3/uL — ABNORMAL LOW (ref 4.0–10.5)
nRBC: 0 % (ref 0.0–0.2)

## 2023-04-06 MED ORDER — SODIUM CHLORIDE 0.9 % IV SOLN
Freq: Once | INTRAVENOUS | Status: AC
  Filled 2023-04-06: qty 250

## 2023-04-06 MED ORDER — HEPARIN SOD (PORK) LOCK FLUSH 100 UNIT/ML IV SOLN
500.0000 [IU] | Freq: Once | INTRAVENOUS | Status: AC | PRN
  Administered 2023-04-06: 500 [IU]
  Filled 2023-04-06: qty 5

## 2023-04-06 MED ORDER — SODIUM CHLORIDE 0.9 % IV SOLN
5.0000 mg/kg | Freq: Once | INTRAVENOUS | Status: AC
  Administered 2023-04-06: 350 mg via INTRAVENOUS
  Filled 2023-04-06: qty 14

## 2023-04-07 LAB — CEA: CEA: 8.4 ng/mL — ABNORMAL HIGH (ref 0.0–4.7)

## 2023-04-11 ENCOUNTER — Other Ambulatory Visit: Payer: Self-pay

## 2023-04-11 ENCOUNTER — Other Ambulatory Visit (HOSPITAL_COMMUNITY): Payer: Self-pay

## 2023-04-13 ENCOUNTER — Other Ambulatory Visit (HOSPITAL_COMMUNITY): Payer: Self-pay

## 2023-04-18 ENCOUNTER — Other Ambulatory Visit: Payer: Self-pay | Admitting: Oncology

## 2023-04-20 ENCOUNTER — Encounter: Payer: Self-pay | Admitting: Oncology

## 2023-04-20 ENCOUNTER — Inpatient Hospital Stay (HOSPITAL_BASED_OUTPATIENT_CLINIC_OR_DEPARTMENT_OTHER): Payer: Medicare HMO | Admitting: Oncology

## 2023-04-20 ENCOUNTER — Inpatient Hospital Stay: Payer: Medicare HMO | Attending: Oncology

## 2023-04-20 ENCOUNTER — Inpatient Hospital Stay: Payer: Medicare HMO

## 2023-04-20 VITALS — BP 126/76 | HR 70 | Temp 96.7°F | Resp 18 | Ht 67.0 in | Wt 157.4 lb

## 2023-04-20 DIAGNOSIS — C189 Malignant neoplasm of colon, unspecified: Secondary | ICD-10-CM | POA: Diagnosis not present

## 2023-04-20 DIAGNOSIS — C786 Secondary malignant neoplasm of retroperitoneum and peritoneum: Secondary | ICD-10-CM | POA: Diagnosis not present

## 2023-04-20 DIAGNOSIS — C772 Secondary and unspecified malignant neoplasm of intra-abdominal lymph nodes: Secondary | ICD-10-CM

## 2023-04-20 DIAGNOSIS — Z79899 Other long term (current) drug therapy: Secondary | ICD-10-CM | POA: Diagnosis not present

## 2023-04-20 DIAGNOSIS — Z5112 Encounter for antineoplastic immunotherapy: Secondary | ICD-10-CM | POA: Insufficient documentation

## 2023-04-20 DIAGNOSIS — F17210 Nicotine dependence, cigarettes, uncomplicated: Secondary | ICD-10-CM | POA: Insufficient documentation

## 2023-04-20 DIAGNOSIS — D702 Other drug-induced agranulocytosis: Secondary | ICD-10-CM

## 2023-04-20 DIAGNOSIS — C187 Malignant neoplasm of sigmoid colon: Secondary | ICD-10-CM | POA: Diagnosis not present

## 2023-04-20 LAB — CBC WITH DIFFERENTIAL (CANCER CENTER ONLY)
Abs Immature Granulocytes: 0.01 10*3/uL (ref 0.00–0.07)
Basophils Absolute: 0 10*3/uL (ref 0.0–0.1)
Basophils Relative: 1 %
Eosinophils Absolute: 0 10*3/uL (ref 0.0–0.5)
Eosinophils Relative: 2 %
HCT: 33.2 % — ABNORMAL LOW (ref 39.0–52.0)
Hemoglobin: 11.2 g/dL — ABNORMAL LOW (ref 13.0–17.0)
Immature Granulocytes: 1 %
Lymphocytes Relative: 39 %
Lymphs Abs: 0.7 10*3/uL (ref 0.7–4.0)
MCH: 31.3 pg (ref 26.0–34.0)
MCHC: 33.7 g/dL (ref 30.0–36.0)
MCV: 92.7 fL (ref 80.0–100.0)
Monocytes Absolute: 0.3 10*3/uL (ref 0.1–1.0)
Monocytes Relative: 18 %
Neutro Abs: 0.7 10*3/uL — ABNORMAL LOW (ref 1.7–7.7)
Neutrophils Relative %: 39 %
Platelet Count: 109 10*3/uL — ABNORMAL LOW (ref 150–400)
RBC: 3.58 MIL/uL — ABNORMAL LOW (ref 4.22–5.81)
RDW: 15.9 % — ABNORMAL HIGH (ref 11.5–15.5)
WBC Count: 1.8 10*3/uL — ABNORMAL LOW (ref 4.0–10.5)
nRBC: 0 % (ref 0.0–0.2)

## 2023-04-20 MED ORDER — SODIUM CHLORIDE 0.9 % IV SOLN
5.0000 mg/kg | Freq: Once | INTRAVENOUS | Status: AC
  Administered 2023-04-20: 350 mg via INTRAVENOUS
  Filled 2023-04-20: qty 14

## 2023-04-20 MED ORDER — HEPARIN SOD (PORK) LOCK FLUSH 100 UNIT/ML IV SOLN
500.0000 [IU] | Freq: Once | INTRAVENOUS | Status: AC | PRN
  Administered 2023-04-20: 500 [IU]
  Filled 2023-04-20: qty 5

## 2023-04-20 MED ORDER — SODIUM CHLORIDE 0.9 % IV SOLN
Freq: Once | INTRAVENOUS | Status: AC
  Filled 2023-04-20: qty 250

## 2023-04-20 MED ORDER — SODIUM CHLORIDE 0.9% FLUSH
10.0000 mL | INTRAVENOUS | Status: DC | PRN
  Filled 2023-04-20: qty 10

## 2023-04-20 NOTE — Progress Notes (Signed)
Hematology/Oncology Consult note [facility]  Telephone:([phone] [phone]  Patient Care Team: [name], MD as PCP - General (Internal Medicine) Benita Gutter, RN as Oncology Nurse Navigator Creig Hines, MD as Consulting Physician (Hematology and Oncology)   Name of the patient: Eric Simon  [id number]  Nov 18, 1951   Date of visit: 04/20/23  Diagnosis- metastatic colon cancer with lymph node metastases   Chief complaint/ Reason for visit-on treatment assessment prior to next cycle of a Avastin  Heme/Onc history: Patient is a 72 yr old male with >40 pack year history of smoking. He currently smokes 0.5ppd. he presented to the ER with symptoms of left sided chest pain and left arm pain. Troponin was negative ekg was unremarkable. Ct chest showed no PE. He was incidentally noted to have mediastinal and retrocrural adenopathy and a 2.1X2.3X4.4 cm retroaortic soft tissue lesion in the posterior left chest. 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  Low-grade activity in the left lateral fifth and sixth ribs associated with nondisplaced fractures likely reflecting healing response problem malignancy.   Patient underwent CT-guided biopsy of the retroperitoneal lymph node pathology showed metastatic adenocarcinoma compatible with colorectal origin.  CK7 negative.  CK20 positive.  CDX 2+.  TTF-1 negative.  PSA negative.  This pattern of immunoreactivity supports the above diagnosis. Patient underwent colonoscopy which showed sigmoid mass that was consistent with adenocarcinoma.  RAS panel testing showed that he was wild-type for both K-ras and BRAF   FOLFOX and bevacizumab chemotherapy started in July 2020.  Subsequently oxaliplatin has been on hold given neuropathy.  He was switched to irinotecan Xeloda panitumumab regimen in  August 2022.  Scans in October 2023  have shown slow increase in the size of intra-abdominal adenopathy but patient continues to have low-volume disease.  Patient received palliative radiation therapy to the enlarging external iliac lymph nodes.  Repeat lymph node biopsy could not be obtained due to location for foundation 1 testing.  Peripheral blood NGS testing did not show any actionable mutations    Interval history-tolerating Lonsurf well without any significant side effects.  Denies any nausea vomiting diarrhea.  Denies any fever or shortness of breath.  ECOG PS- 0 Pain scale- 0 Opioid associated constipation- no  Review of systems- Review of Systems  Constitutional:  Negative for chills, fever, malaise/fatigue and weight loss.  HENT:  Negative for congestion, ear discharge and nosebleeds.   Eyes:  Negative for blurred vision.  Respiratory:  Negative for cough, hemoptysis, sputum production, shortness of breath and wheezing.   Cardiovascular:  Negative for chest pain, palpitations, orthopnea and claudication.  Gastrointestinal:  Negative for abdominal pain, blood in stool, constipation, diarrhea, heartburn, melena, nausea and vomiting.  Genitourinary:  Negative for dysuria, flank pain, frequency, hematuria and urgency.  Musculoskeletal:  Negative for back pain, joint pain and myalgias.  Skin:  Negative for rash.  Neurological:  Negative for dizziness, tingling, focal weakness, seizures, weakness and headaches.  Endo/Heme/Allergies:  Does not bruise/bleed easily.  Psychiatric/Behavioral:  Negative for depression and suicidal ideas. The patient does not have insomnia.       No Known Allergies   Past Medical History:  Diagnosis Date   Colon cancer (HCC)    COPD (chronic obstructive pulmonary disease) (HCC)    Hyperlipidemia      Past Surgical History:  Procedure Laterality Date   COLONOSCOPY WITH  PROPOFOL N/A 06/26/2019   Procedure: COLONOSCOPY WITH PROPOFOL;  Surgeon: Wyline Mood, MD;  Location[facility] ENDOSCOPY;  Service: Gastroenterology;  Laterality: N/A;   ESOPHAGOGASTRODUODENOSCOPY (EGD) WITH PROPOFOL N/A 01/12/2023   Procedure: ESOPHAGOGASTRODUODENOSCOPY (EGD) WITH PROPOFOL;  Surgeon: Wyline Mood, MD;  Location: [facility] ENDOSCOPY;  Service: Gastroenterology;  Laterality: N/A;   FLEXIBLE SIGMOIDOSCOPY N/A 05/07/2021   Procedure: FLEXIBLE SIGMOIDOSCOPY;  Surgeon: Wyline Mood, MD;  Location: [facility] ENDOSCOPY;  Service: Gastroenterology;  Laterality: N/A;   PORTA CATH INSERTION N/A 06/25/2019   Procedure: PORTA CATH INSERTION;  Surgeon: Annice Needy, MD;  Location: [facility] INVASIVE CV LAB;  Service: Cardiovascular;  Laterality: N/A;   PORTA CATH INSERTION Left 08/11/2020   Procedure: PORTA CATH INSERTION;  Surgeon: Annice Needy, MD;  Location: [facility] INVASIVE CV LAB;  Service: Cardiovascular;  Laterality: Left;   PORTA CATH REMOVAL Right 08/11/2020   Procedure: PORTA CATH REMOVAL;  Surgeon: Annice Needy, MD;  Location: [facility] INVASIVE CV LAB;  Service: Cardiovascular;  Laterality: Right;    Social History   Socioeconomic History   Marital status: Single    Spouse name: Not on file   Number of children: Not on file   Years of education: Not on file   Highest education level: Not on file  Occupational History   Not on file  Tobacco Use   Smoking status: Every Day    Packs/day: 0.50    Years: 40.00    Additional pack years: 0.00    Total pack years: 20.00    Types: Cigarettes   Smokeless tobacco: Never   Tobacco comments:    smoking 40 years  Vaping Use   Vaping Use: Never used  Substance and Sexual Activity   Alcohol use: No   Drug use: No   Sexual activity: Not Currently  Other Topics Concern   Not on file  Social History Narrative   Not on file   Social Determinants of Health   Financial Resource Strain: Low Risk  (06/12/2019)   Overall Financial Resource Strain (CARDIA)    Difficulty of Paying Living Expenses: Not hard at all  Food Insecurity: No Food  Insecurity (06/12/2019)   Hunger Vital Sign    Worried About Running Out of Food in the Last Year: Never true    Ran Out of Food in the Last Year: Never true  Transportation Needs: No Transportation Needs (06/12/2019)   PRAPARE - Administrator, Civil Service (Medical): No    Lack of Transportation (Non-Medical): No  Physical Activity: Not on file  Stress: No Stress Concern Present (06/12/2019)   [employer] of Occupational Health - Occupational Stress Questionnaire    Feeling of Stress : Not at all  Social Connections: Unknown (06/12/2019)   Social Connection and Isolation Panel [NHANES]    Frequency of Communication with Friends and Family: More than three times a week    Frequency of Social Gatherings with Friends and Family: Not on file    Attends Religious Services: Not on file    Active Member of Clubs or Organizations: Not on file    Attends Banker Meetings: Not on file    Marital Status: Not on file  Intimate Partner Violence: Not At Risk (06/12/2019)   Humiliation, Afraid, Rape, and Kick questionnaire    Fear of Current or Ex-Partner: No    Emotionally Abused: No    Physically Abused: No    Sexually Abused: No    Family History  Problem Relation  Age of Onset   Brain cancer Father      Current Outpatient Medications:    aspirin 81 MG chewable tablet, Chew by mouth., Disp: , Rfl:    aspirin EC 81 MG tablet, Take 81 mg by mouth daily., Disp: , Rfl:    azelastine (ASTELIN) 0.1 % nasal spray, Place 1 spray into both nostrils daily as needed., Disp: , Rfl:    budesonide-formoterol (SYMBICORT) 80-4.5 MCG/ACT inhaler, USE 2 INHALATION BY MOUTH TWICE DAILY, Disp: 10.2 g, Rfl: 2   cetirizine (ZYRTEC) 10 MG tablet, Take 1 tablet (10 mg total) by mouth daily., Disp: 30 tablet, Rfl: 0   chlorthalidone (HYGROTON) 25 MG tablet, TAKE 1/2 TABLET BY MOUTH EVERY DAY AS DIRECTED, Disp: 90 tablet, Rfl: 1   cyclobenzaprine (FLEXERIL) 10 MG tablet, TAKE 1  TABLET(10 MG) BY MOUTH THREE TIMES DAILY AS NEEDED FOR MUSCLE SPASMS, Disp: 42 tablet, Rfl: 0   dexlansoprazole (DEXILANT) 60 MG capsule, Take 1 capsule (60 mg total) by mouth every morning., Disp: 90 capsule, Rfl: 1   dexlansoprazole (DEXILANT) 60 MG capsule, Take by mouth., Disp: , Rfl:    DULoxetine (CYMBALTA) 30 MG capsule, Take 1 capsule (30 mg total) by mouth 2 (two) times daily., Disp: 60 capsule, Rfl: 1   fluticasone (FLONASE) 50 MCG/ACT nasal spray, Place 2 sprays into both nostrils daily as needed., Disp: , Rfl:    lidocaine-prilocaine (EMLA) cream, Apply 1 Application topically as needed (pt will put 1 inch of cream over port 1 hourfor each treatment, cover plastic over the cream protect clothes)., Disp: 30 g, Rfl: 3   lisinopril (ZESTRIL) 5 MG tablet, TAKE 1 TABLET BY MOUTH EVERY DAY, Disp: 90 tablet, Rfl: 1   montelukast (SINGULAIR) 10 MG tablet, Take 10 mg by mouth at bedtime., Disp: , Rfl:    NEXLETOL 180 MG TABS, Take 1 tablet (180 mg total) by mouth daily., Disp: 90 tablet, Rfl: 1   nicotine (NICODERM CQ - DOSED IN MG/24 HOURS) 21 mg/24hr patch, Place onto the skin., Disp: , Rfl:    nicotine polacrilex (COMMIT) 4 MG lozenge, Place inside cheek., Disp: , Rfl:    pregabalin (LYRICA) 75 MG capsule, TAKE 1 CAPSULE BY MOUTH IN THE MORNING AND TAKE 2 CAPSULES AT BEDTIME, Disp: 90 capsule, Rfl: 0   simvastatin (ZOCOR) 20 MG tablet, TAKE 1 TABLET BY MOUTH EVERY EVENING, Disp: 90 tablet, Rfl: 1   tamsulosin (FLOMAX) 0.4 MG CAPS capsule, Take 0.4 mg by mouth. , Disp: , Rfl:    trifluridine-tipiracil (LONSURF) 20-8.19 MG tablet, Take 3 tablets (60 mg of trifluridine total) by mouth 2 (two) times daily. 1hr after AM & PM meals days 1-5, 8-12. Repeat every 28d., Disp: 60 tablet, Rfl: 3 No current facility-administered medications for this visit.  Facility-Administered Medications Ordered in Other Visits:    atropine 1 MG/ML injection, , , ,    heparin lock flush 100 UNIT/ML injection, , , ,    Physical exam: There were no vitals filed for this visit. Physical Exam Cardiovascular:     Rate and Rhythm: Normal rate and regular rhythm.     Heart sounds: Normal heart sounds.  Pulmonary:     Effort: Pulmonary effort is normal.     Breath sounds: Normal breath sounds.  Abdominal:     General: Bowel sounds are normal.     Palpations: Abdomen is soft.  Skin:    General: Skin is warm and dry.  Neurological:     Mental Status: He  is alert and oriented to person, place, and time.         Latest Ref Rng & Units 02/28/2023    8:21 AM  CMP  Glucose 70 - 99 mg/dL 161   BUN 8 - 23 mg/dL 26   Creatinine 0.96 - 1.24 mg/dL 0.45   Sodium 409 - 811 mmol/L 138   Potassium 3.5 - 5.1 mmol/L 4.1   Chloride 98 - 111 mmol/L 107   CO2 22 - 32 mmol/L 23   Calcium 8.9 - 10.3 mg/dL 8.6   Total Protein 6.5 - 8.1 g/dL 7.1   Total Bilirubin 0.3 - 1.2 mg/dL 0.6   Alkaline Phos 38 - 126 U/L 101   AST 15 - 41 U/L 27   ALT 0 - 44 U/L 13       Latest Ref Rng & Units 04/20/2023    8:31 AM  CBC  WBC 4.0 - 10.5 K/uL 1.8   Hemoglobin 13.0 - 17.0 g/dL 91.4   Hematocrit 78.2 - 52.0 % 33.2   Platelets 150 - 400 K/uL 109      Assessment and plan- Patient is a 72 y.o. male with metastatic colon cancer and lymph node metastases.  He is here for on treatment assessment prior to cycle 2-day 1 of a Avastin  Patient was started on Lonsurf Avastin regimen in April 2024.  He tolerated cycle 1 of Lonsurf well without any significant side effects.  His white cell count is down to1.8 today with an ANC of 0.7.  Have asked him to hold off on taking his Lonsurf at this time and we will give him a lower dose of Lonsurf which will be cut down by 5 mg/m.  He will proceed with a Avastin today as planned.  I will repeat CBC again in 2 weeks time.  He will directly receive cycle 2-day 15 of a Avastin in 2 weeks and I will see him back in 4 weeks for cycle 3-day 1   Visit Diagnosis 1. High risk medication use   2.  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 Encounter for monoclonal antibody treatment for malignancy      Dr. Owens Shark, MD, MPH [facility] [phone] 04/20/2023 8:42 AM

## 2023-04-20 NOTE — Patient Instructions (Signed)
[facility] [place] [facility]  Discharge Instructions: Thank you for choosing [facility] to provide your oncology and hematology care.  If you have a lab appointment with the Cancer Center, please go directly to the Cancer Center and check in at the registration area.  Wear comfortable clothing and clothing appropriate for easy access to any Portacath or PICC line.   We strive to give you quality time with your provider. You may need to reschedule your appointment if you arrive late (15 or more minutes).  Arriving late affects you and other patients whose appointments are after yours.  Also, if you miss three or more appointments without notifying the office, you may be dismissed from the clinic at the provider's discretion.      For prescription refill requests, have your pharmacy contact our office and allow 72 hours for refills to be completed.    Today you received the following chemotherapy and/or immunotherapy agents MVASI      To help prevent nausea and vomiting after your treatment, we encourage you to take your nausea medication as directed.  BELOW ARE SYMPTOMS THAT SHOULD BE REPORTED IMMEDIATELY: *FEVER GREATER THAN 100.4 F (38 C) OR HIGHER *CHILLS OR SWEATING *NAUSEA AND VOMITING THAT IS NOT CONTROLLED WITH YOUR NAUSEA MEDICATION *UNUSUAL SHORTNESS OF BREATH *UNUSUAL BRUISING OR BLEEDING *URINARY PROBLEMS (pain or burning when urinating, or frequent urination) *BOWEL PROBLEMS (unusual diarrhea, constipation, pain near the anus) TENDERNESS IN MOUTH AND THROAT WITH OR WITHOUT PRESENCE OF ULCERS (sore throat, sores in mouth, or a toothache) UNUSUAL RASH, SWELLING OR PAIN  UNUSUAL VAGINAL DISCHARGE OR ITCHING   Items with * indicate a potential emergency and should be followed up as soon as possible or go to the Emergency Department if any problems should occur.  Please show the CHEMOTHERAPY ALERT CARD or IMMUNOTHERAPY ALERT CARD at check-in to the  Emergency Department and triage nurse.  Should you have questions after your visit or need to cancel or reschedule your appointment, please contact [facility] [place] [facility]  [phone] and follow the prompts.  Office hours are 8:00 a.m. to 4:30 p.m. Monday - Friday. Please note that voicemails left after 4:00 p.m. may not be returned until the following business day.  We are closed weekends and major holidays. You have access to a nurse at all times for urgent questions. Please call the main number to the clinic [phone] and follow the prompts.  For any non-urgent questions, you may also contact your provider using MyChart. We now offer e-Visits for anyone 18 and older to request care online for non-urgent symptoms. For details visit [email or web address].   Also download the MyChart app! Go to the app store, search "MyChart", open the app, select [place], and log in with your MyChart username and password.  Bevacizumab Injection What is this medication? BEVACIZUMAB (be va SIZ yoo mab) treats some types of cancer. It works by blocking a protein that causes cancer cells to grow and multiply. This helps to slow or stop the spread of cancer cells. It is a monoclonal antibody. This medicine may be used for other purposes; ask your health care provider or pharmacist if you have questions. COMMON BRAND NAME(S): Alymsys, Avastin, MVASI, Zirabev What should I tell my care team before I take this medication? They need to know if you have any of these conditions: Blood clots Coughing up blood Having or recent surgery Heart failure High blood pressure History of a connection   between 2 or more body parts that do not usually connect (fistula) History of a tear in your stomach or intestines Protein in your urine An unusual or allergic reaction to bevacizumab, other medications, foods, dyes, or preservatives Pregnant or trying to get pregnant Breast-feeding How should I  use this medication? This medication is injected into a vein. It is given by your care team in a hospital or clinic setting. Talk to your care team the use of this medication in children. Special care may be needed. Overdosage: If you think you have taken too much of this medicine contact a poison control center or emergency room at once. NOTE: This medicine is only for you. Do not share this medicine with others. What if I miss a dose? Keep appointments for follow-up doses. It is important not to miss your dose. Call your care team if you are unable to keep an appointment. What may interact with this medication? Interactions are not expected. This list may not describe all possible interactions. Give your health care provider a list of all the medicines, herbs, non-prescription drugs, or dietary supplements you use. Also tell them if you smoke, drink alcohol, or use illegal drugs. Some items may interact with your medicine. What should I watch for while using this medication? Your condition will be monitored carefully while you are receiving this medication. You may need blood work while taking this medication. This medication may make you feel generally unwell. This is not uncommon as chemotherapy can affect healthy cells as well as cancer cells. Report any side effects. Continue your course of treatment even though you feel ill unless your care team tells you to stop. This medication may increase your risk to bruise or bleed. Call your care team if you notice any unusual bleeding. Before having surgery, talk to your care team to make sure it is ok. This medication can increase the risk of poor healing of your surgical site or wound. You will need to stop this medication for 28 days before surgery. After surgery, wait at least 28 days before restarting this medication. Make sure the surgical site or wound is healed enough before restarting this medication. Talk to your care team if questions. Talk  to your care team if you may be pregnant. Serious birth defects can occur if you take this medication during pregnancy and for 6 months after the last dose. Contraception is recommended while taking this medication and for 6 months after the last dose. Your care team can help you find the option that works for you. Do not breastfeed while taking this medication and for 6 months after the last dose. This medication can cause infertility. Talk to your care team if you are concerned about your fertility. What side effects may I notice from receiving this medication? Side effects that you should report to your care team as soon as possible: Allergic reactions--skin rash, itching, hives, swelling of the face, lips, tongue, or throat Bleeding--bloody or black, tar-like stools, vomiting blood or brown material that looks like coffee grounds, red or dark brown urine, small red or purple spots on skin, unusual bruising or bleeding Blood clot--pain, swelling, or warmth in the leg, shortness of breath, chest pain Heart attack--pain or tightness in the chest, shoulders, arms, or jaw, nausea, shortness of breath, cold or clammy skin, feeling faint or lightheaded Heart failure--shortness of breath, swelling of the ankles, feet, or hands, sudden weight gain, unusual weakness or fatigue Increase in blood pressure Infection--fever, chills, cough,   sore throat, wounds that don't heal, pain or trouble when passing urine, general feeling of discomfort or being unwell Infusion reactions--chest pain, shortness of breath or trouble breathing, feeling faint or lightheaded Kidney injury--decrease in the amount of urine, swelling of the ankles, hands, or feet Stomach pain that is severe, does not go away, or gets worse Stroke--sudden numbness or weakness of the face, arm, or leg, trouble speaking, confusion, trouble walking, loss of balance or coordination, dizziness, severe headache, change in vision Sudden and severe  headache, confusion, change in vision, seizures, which may be signs of posterior reversible encephalopathy syndrome (PRES) Side effects that usually do not require medical attention (report to your care team if they continue or are bothersome): Back pain Change in taste Diarrhea Dry skin Increased tears Nosebleed This list may not describe all possible side effects. Call your doctor for medical advice about side effects. You may report side effects to FDA at 1-800-FDA-1088. Where should I keep my medication? This medication is given in a hospital or clinic. It will not be stored at home. NOTE: This sheet is a summary. It may not cover all possible information. If you have questions about this medicine, talk to your doctor, pharmacist, or health care provider.  2023 Elsevier/Gold Standard (2022-04-02 00:00:00)    

## 2023-04-21 ENCOUNTER — Telehealth: Payer: Self-pay | Admitting: Pharmacist

## 2023-04-22 NOTE — Telephone Encounter (Signed)
Oral Chemotherapy Pharmacist Encounter   Dr. Smith Robert would like for patient to reduce his dose of Lonsurf. He will begin taking 60mg  in AM and 40mg  in PM.   Remi Haggard, PharmD, BCPS, BCOP, CPP Hematology/Oncology Clinical Pharmacist [place]/DB/AP Oral Chemotherapy [facility] [phone]  04/22/2023 3:31 PM

## 2023-05-04 ENCOUNTER — Inpatient Hospital Stay: Payer: Medicare HMO

## 2023-05-04 ENCOUNTER — Other Ambulatory Visit: Payer: Self-pay | Admitting: *Deleted

## 2023-05-04 VITALS — BP 93/68 | HR 60 | Temp 96.0°F | Resp 18 | Wt 157.6 lb

## 2023-05-04 DIAGNOSIS — C189 Malignant neoplasm of colon, unspecified: Secondary | ICD-10-CM

## 2023-05-04 DIAGNOSIS — Z5112 Encounter for antineoplastic immunotherapy: Secondary | ICD-10-CM

## 2023-05-04 DIAGNOSIS — C786 Secondary malignant neoplasm of retroperitoneum and peritoneum: Secondary | ICD-10-CM | POA: Diagnosis not present

## 2023-05-04 DIAGNOSIS — Z79899 Other long term (current) drug therapy: Secondary | ICD-10-CM

## 2023-05-04 DIAGNOSIS — C187 Malignant neoplasm of sigmoid colon: Secondary | ICD-10-CM | POA: Diagnosis not present

## 2023-05-04 DIAGNOSIS — F17210 Nicotine dependence, cigarettes, uncomplicated: Secondary | ICD-10-CM | POA: Diagnosis not present

## 2023-05-04 LAB — CBC WITH DIFFERENTIAL (CANCER CENTER ONLY)
Abs Immature Granulocytes: 0.01 10*3/uL (ref 0.00–0.07)
Basophils Absolute: 0 10*3/uL (ref 0.0–0.1)
Basophils Relative: 1 %
Eosinophils Absolute: 0 10*3/uL (ref 0.0–0.5)
Eosinophils Relative: 2 %
HCT: 28.8 % — ABNORMAL LOW (ref 39.0–52.0)
Hemoglobin: 9.9 g/dL — ABNORMAL LOW (ref 13.0–17.0)
Immature Granulocytes: 0 %
Lymphocytes Relative: 27 %
Lymphs Abs: 0.7 10*3/uL (ref 0.7–4.0)
MCH: 31.2 pg (ref 26.0–34.0)
MCHC: 34.4 g/dL (ref 30.0–36.0)
MCV: 90.9 fL (ref 80.0–100.0)
Monocytes Absolute: 0.2 10*3/uL (ref 0.1–1.0)
Monocytes Relative: 6 %
Neutro Abs: 1.7 10*3/uL (ref 1.7–7.7)
Neutrophils Relative %: 64 %
Platelet Count: 84 10*3/uL — ABNORMAL LOW (ref 150–400)
RBC: 3.17 MIL/uL — ABNORMAL LOW (ref 4.22–5.81)
RDW: 14.7 % (ref 11.5–15.5)
WBC Count: 2.6 10*3/uL — ABNORMAL LOW (ref 4.0–10.5)
nRBC: 0 % (ref 0.0–0.2)

## 2023-05-04 LAB — COMPREHENSIVE METABOLIC PANEL
ALT: 20 U/L (ref 0–44)
AST: 32 U/L (ref 15–41)
Albumin: 3.7 g/dL (ref 3.5–5.0)
Alkaline Phosphatase: 128 U/L — ABNORMAL HIGH (ref 38–126)
Anion gap: 10 (ref 5–15)
BUN: 39 mg/dL — ABNORMAL HIGH (ref 8–23)
CO2: 22 mmol/L (ref 22–32)
Calcium: 8.4 mg/dL — ABNORMAL LOW (ref 8.9–10.3)
Chloride: 104 mmol/L (ref 98–111)
Creatinine, Ser: 1.27 mg/dL — ABNORMAL HIGH (ref 0.61–1.24)
GFR, Estimated: 60 mL/min — ABNORMAL LOW (ref >60)
Glucose, Bld: 99 mg/dL (ref 70–99)
Potassium: 4.1 mmol/L (ref 3.5–5.1)
Sodium: 136 mmol/L (ref 135–145)
Total Bilirubin: 0.8 mg/dL (ref 0.3–1.2)
Total Protein: 6.9 g/dL (ref 6.5–8.1)

## 2023-05-04 LAB — PROTEIN, URINE, RANDOM: Total Protein, Urine: 13 mg/dL

## 2023-05-04 MED ORDER — SODIUM CHLORIDE 0.9 % IV SOLN
Freq: Once | INTRAVENOUS | Status: AC
  Filled 2023-05-04: qty 250

## 2023-05-04 MED ORDER — SODIUM CHLORIDE 0.9 % IV SOLN
5.0000 mg/kg | Freq: Once | INTRAVENOUS | Status: AC
  Administered 2023-05-04: 350 mg via INTRAVENOUS
  Filled 2023-05-04: qty 14

## 2023-05-04 MED ORDER — HEPARIN SOD (PORK) LOCK FLUSH 100 UNIT/ML IV SOLN
500.0000 [IU] | Freq: Once | INTRAVENOUS | Status: AC | PRN
  Administered 2023-05-04: 500 [IU]
  Filled 2023-05-04: qty 5

## 2023-05-04 NOTE — Progress Notes (Signed)
Plt 84 ok to proceed per MD

## 2023-05-04 NOTE — Patient Instructions (Signed)
[facility]  Discharge Instructions: Thank you for choosing [facility] to provide your oncology and hematology care.  If you have a lab appointment with the Cancer Center, please go directly to the Cancer Center and check in at the registration area.  Wear comfortable clothing and clothing appropriate for easy access to any Portacath or PICC line.   We strive to give you quality time with your provider. You may need to reschedule your appointment if you arrive late (15 or more minutes).  Arriving late affects you and other patients whose appointments are after yours.  Also, if you miss three or more appointments without notifying the office, you may be dismissed from the clinic at the provider's discretion.      For prescription refill requests, have your pharmacy contact our office and allow 72 hours for refills to be completed.    Today you received the following chemotherapy and/or immunotherapy agents Mvasi        To help prevent nausea and vomiting after your treatment, we encourage you to take your nausea medication as directed.  BELOW ARE SYMPTOMS THAT SHOULD BE REPORTED IMMEDIATELY: *FEVER GREATER THAN 100.4 F (38 C) OR HIGHER *CHILLS OR SWEATING *NAUSEA AND VOMITING THAT IS NOT CONTROLLED WITH YOUR NAUSEA MEDICATION *UNUSUAL SHORTNESS OF BREATH *UNUSUAL BRUISING OR BLEEDING *URINARY PROBLEMS (pain or burning when urinating, or frequent urination) *BOWEL PROBLEMS (unusual diarrhea, constipation, pain near the anus) TENDERNESS IN MOUTH AND THROAT WITH OR WITHOUT PRESENCE OF ULCERS (sore throat, sores in mouth, or a toothache) UNUSUAL RASH, SWELLING OR PAIN  UNUSUAL VAGINAL DISCHARGE OR ITCHING   Items with * indicate a potential emergency and should be followed up as soon as possible or go to the Emergency Department if any problems should occur.  Please show the CHEMOTHERAPY ALERT CARD or IMMUNOTHERAPY ALERT CARD at check-in to  the Emergency Department and triage nurse.  Should you have questions after your visit or need to cancel or reschedule your appointment, please contact [facility]  [phone] and follow the prompts.  Office hours are 8:00 a.m. to 4:30 p.m. Monday - Friday. Please note that voicemails left after 4:00 p.m. may not be returned until the following business day.  We are closed weekends and major holidays. You have access to a nurse at all times for urgent questions. Please call the main number to the clinic [phone] and follow the prompts.  For any non-urgent questions, you may also contact your provider using MyChart. We now offer e-Visits for anyone 25 and older to request care online for non-urgent symptoms. For details visit [email or web address].   Also download the MyChart app! Go to the app store, search "MyChart", open the app, select [place], and log in with your MyChart username and password.

## 2023-05-05 ENCOUNTER — Other Ambulatory Visit (HOSPITAL_COMMUNITY): Payer: Self-pay

## 2023-05-06 ENCOUNTER — Ambulatory Visit (INDEPENDENT_AMBULATORY_CARE_PROVIDER_SITE_OTHER): Payer: Medicare HMO | Admitting: Internal Medicine

## 2023-05-06 ENCOUNTER — Other Ambulatory Visit: Payer: Self-pay

## 2023-05-06 ENCOUNTER — Other Ambulatory Visit (HOSPITAL_COMMUNITY): Payer: Self-pay

## 2023-05-06 VITALS — BP 104/50 | HR 91 | Ht 67.0 in | Wt 150.0 lb

## 2023-05-06 DIAGNOSIS — E782 Mixed hyperlipidemia: Secondary | ICD-10-CM

## 2023-05-06 DIAGNOSIS — J301 Allergic rhinitis due to pollen: Secondary | ICD-10-CM | POA: Diagnosis not present

## 2023-05-06 DIAGNOSIS — J410 Simple chronic bronchitis: Secondary | ICD-10-CM

## 2023-05-06 DIAGNOSIS — I10 Essential (primary) hypertension: Secondary | ICD-10-CM

## 2023-05-06 MED ORDER — SIMVASTATIN 20 MG PO TABS: 20.0000 mg | ORAL_TABLET | Freq: Every evening | ORAL | 1 refills | Status: DC

## 2023-05-06 MED ORDER — TRIAMCINOLONE ACETONIDE 55 MCG/ACT NA AERO
2.0000 | INHALATION_SPRAY | Freq: Every day | NASAL | 2 refills | Status: AC
Start: 2023-05-06 — End: 2023-09-20

## 2023-05-06 MED ORDER — LISINOPRIL 5 MG PO TABS: 5.0000 mg | ORAL_TABLET | Freq: Every day | ORAL | 1 refills | Status: DC

## 2023-05-06 MED ORDER — BUDESONIDE-FORMOTEROL FUMARATE 80-4.5 MCG/ACT IN AERO: INHALATION_SPRAY | RESPIRATORY_TRACT | 2 refills | Status: DC

## 2023-05-06 NOTE — Progress Notes (Signed)
Established Patient Office Visit  Subjective:  Patient ID: Eric Simon, male    DOB: 1950-12-25  Age: 72 y.o. MRN: [id number]  Chief Complaint  Patient presents with   Follow-up    3 Months Follow Up    Still c/o uncontrolled seasonal allergies, here for lab review and medication refills. Intra-abdominal metastasis of Colon ca and currently on cyclical chemo and biweekly infusions.     No other concerns at this time.   Past Medical History:  Diagnosis Date   Colon cancer (HCC)    COPD (chronic obstructive pulmonary disease) (HCC)    Hyperlipidemia     Past Surgical History:  Procedure Laterality Date   COLONOSCOPY WITH PROPOFOL N/A 06/26/2019   Procedure: COLONOSCOPY WITH PROPOFOL;  Surgeon: Wyline Mood, MD;  Location: [facility] ENDOSCOPY;  Service: Gastroenterology;  Laterality: N/A;   ESOPHAGOGASTRODUODENOSCOPY (EGD) WITH PROPOFOL N/A 01/12/2023   Procedure: ESOPHAGOGASTRODUODENOSCOPY (EGD) WITH PROPOFOL;  Surgeon: Wyline Mood, MD;  Location: [facility] ENDOSCOPY;  Service: Gastroenterology;  Laterality: N/A;   FLEXIBLE SIGMOIDOSCOPY N/A 05/07/2021   Procedure: FLEXIBLE SIGMOIDOSCOPY;  Surgeon: Wyline Mood, MD;  Location: [facility] ENDOSCOPY;  Service: Gastroenterology;  Laterality: N/A;   PORTA CATH INSERTION N/A 06/25/2019   Procedure: PORTA CATH INSERTION;  Surgeon: Annice Needy, MD;  Location: [facility] INVASIVE CV LAB;  Service: Cardiovascular;  Laterality: N/A;   PORTA CATH INSERTION Left 08/11/2020   Procedure: PORTA CATH INSERTION;  Surgeon: Annice Needy, MD;  Location: [facility] INVASIVE CV LAB;  Service: Cardiovascular;  Laterality: Left;   PORTA CATH REMOVAL Right 08/11/2020   Procedure: PORTA CATH REMOVAL;  Surgeon: Annice Needy, MD;  Location: [facility] INVASIVE CV LAB;  Service: Cardiovascular;  Laterality: Right;    Social History   Socioeconomic History   Marital status: Single    Spouse name: Not on file   Number of children: Not on file   Years of education: Not on file    Highest education level: Not on file  Occupational History   Not on file  Tobacco Use   Smoking status: Every Day    Packs/day: 0.50    Years: 40.00    Additional pack years: 0.00    Total pack years: 20.00    Types: Cigarettes   Smokeless tobacco: Never   Tobacco comments:    smoking 40 years  Vaping Use   Vaping Use: Never used  Substance and Sexual Activity   Alcohol use: No   Drug use: No   Sexual activity: Not Currently  Other Topics Concern   Not on file  Social History Narrative   Not on file   Social Determinants of Health   Financial Resource Strain: Low Risk  (06/12/2019)   Overall Financial Resource Strain (CARDIA)    Difficulty of Paying Living Expenses: Not hard at all  Food Insecurity: No Food Insecurity (06/12/2019)   Hunger Vital Sign    Worried About Running Out of Food in the Last Year: Never true    Ran Out of Food in the Last Year: Never true  Transportation Needs: No Transportation Needs (06/12/2019)   PRAPARE - Administrator, Civil Service (Medical): No    Lack of Transportation (Non-Medical): No  Physical Activity: Not on file  Stress: No Stress Concern Present (06/12/2019)   [employer] of Occupational Health - Occupational Stress Questionnaire    Feeling of Stress : Not at all  Social Connections: Unknown (06/12/2019)   Social Connection and Isolation Panel [NHANES]  Frequency of Communication with Friends and Family: More than three times a week    Frequency of Social Gatherings with Friends and Family: Not on file    Attends Religious Services: Not on file    Active Member of Clubs or Organizations: Not on file    Attends Banker Meetings: Not on file    Marital Status: Not on file  Intimate Partner Violence: Not At Risk (06/12/2019)   Humiliation, Afraid, Rape, and Kick questionnaire    Fear of Current or Ex-Partner: No    Emotionally Abused: No    Physically Abused: No    Sexually Abused: No    Family  History  Problem Relation Age of Onset   Brain cancer Father     No Known Allergies  Review of Systems  Constitutional: Negative.   HENT: Negative.    Eyes: Negative.   Respiratory: Negative.    Cardiovascular: Negative.   Gastrointestinal: Negative.   Genitourinary: Negative.   Skin: Negative.   Neurological: Negative.   Endo/Heme/Allergies: Negative.        Objective:   BP (!) 104/50   Pulse 91   Ht 5\' 7"  (1.702 m)   Wt 150 lb (68 kg)   SpO2 94%   BMI 23.49 kg/m   Vitals:   05/06/23 1018  BP: (!) 104/50  Pulse: 91  Height: 5\' 7"  (1.702 m)  Weight: 150 lb (68 kg)  SpO2: 94%  BMI (Calculated): 23.49    Physical Exam Vitals reviewed.  Constitutional:      Appearance: Normal appearance.  HENT:     Head: Normocephalic.     Left Ear: There is no impacted cerumen.     Nose: Nose normal.     Mouth/Throat:     Mouth: Mucous membranes are moist.     Pharynx: No posterior oropharyngeal erythema.  Eyes:     Comments: R false eye  Cardiovascular:     Rate and Rhythm: Regular rhythm.     Chest Wall: PMI is not displaced.     Pulses: Normal pulses.     Heart sounds: Normal heart sounds. No murmur heard. Pulmonary:     Effort: Pulmonary effort is normal.     Breath sounds: Normal air entry. No rhonchi or rales.  Abdominal:     General: Abdomen is flat. Bowel sounds are normal. There is no distension.     Palpations: Abdomen is soft. There is no hepatomegaly, splenomegaly or mass.     Tenderness: There is no abdominal tenderness.  Musculoskeletal:        General: Normal range of motion.     Cervical back: Normal range of motion and neck supple.     Right lower leg: No edema.     Left lower leg: No edema.  Skin:    General: Skin is warm and dry.  Neurological:     General: No focal deficit present.     Mental Status: He is alert and oriented to person, place, and time.     Cranial Nerves: No cranial nerve deficit.     Motor: No weakness.  Psychiatric:         Mood and Affect: Mood normal.        Behavior: Behavior normal.      No results found for any visits on 05/06/23.  Recent Results (from the past 2160 hour(Jeannie Mallinger))  Magnesium     Status: Abnormal   Collection Time: 02/07/23  8:10 AM  Result Value Ref  Range   Magnesium 1.4 (L) 1.7 - 2.4 mg/dL    Comment: Performed at [facility], [address] [place], [place] [address]  Comprehensive metabolic panel     Status: Abnormal   Collection Time: 02/07/23  8:10 AM  Result Value Ref Range   Sodium 136 135 - 145 mmol/L   Potassium 4.1 3.5 - 5.1 mmol/L   Chloride 104 98 - 111 mmol/L   CO2 23 22 - 32 mmol/L   Glucose, Bld 98 70 - 99 mg/dL    Comment: Glucose reference range applies only to samples taken after fasting for at least 8 hours.   BUN 31 (H) 8 - 23 mg/dL   Creatinine, Ser 6.04 (H) 0.61 - 1.24 mg/dL   Calcium 8.5 (L) 8.9 - 10.3 mg/dL   Total Protein 6.8 6.5 - 8.1 g/dL   Albumin 3.5 3.5 - 5.0 g/dL   AST 24 15 - 41 U/L   ALT 21 0 - 44 U/L   Alkaline Phosphatase 110 38 - 126 U/L   Total Bilirubin 0.6 0.3 - 1.2 mg/dL   GFR, Estimated 56 (L) >60 mL/min    Comment: (NOTE) Calculated using the CKD-EPI Creatinine Equation (2021)    Anion gap 9 5 - 15    Comment: Performed at [facility], [address] [place], [place] [address]  CBC with Differential     Status: Abnormal   Collection Time: 02/07/23  8:10 AM  Result Value Ref Range   WBC 5.0 4.0 - 10.5 K/uL   RBC 3.96 (L) 4.22 - 5.81 MIL/uL   Hemoglobin 12.9 (L) 13.0 - 17.0 g/dL   HCT 11.9 (L) 14.7 - 82.9 %   MCV 96.5 80.0 - 100.0 fL   MCH 32.6 26.0 - 34.0 pg   MCHC 33.8 30.0 - 36.0 g/dL   RDW 56.2 13.0 - 86.5 %   Platelets 98 (L) 150 - 400 K/uL    Comment: SPECIMEN CHECKED FOR CLOTS   nRBC 0.0 0.0 - 0.2 %   Neutrophils Relative % 69 %   Neutro Abs 3.5 1.7 - 7.7 K/uL   Lymphocytes Relative 17 %   Lymphs Abs 0.9 0.7 - 4.0 K/uL   Monocytes Relative 9 %   Monocytes Absolute 0.4 0.1 - 1.0 K/uL    Eosinophils Relative 4 %   Eosinophils Absolute 0.2 0.0 - 0.5 K/uL   Basophils Relative 1 %   Basophils Absolute 0.0 0.0 - 0.1 K/uL   Immature Granulocytes 0 %   Abs Immature Granulocytes 0.02 0.00 - 0.07 K/uL    Comment: Performed at [facility], [address] [place], [place] [address]  CBC with Differential/Platelet     Status: Abnormal   Collection Time: 02/08/23 11:30 AM  Result Value Ref Range   WBC 8.2 3.4 - 10.8 x10E3/uL   RBC 4.30 4.14 - 5.80 x10E6/uL   Hemoglobin 14.2 13.0 - 17.7 g/dL   Hematocrit 62.9 52.8 - 51.0 %   MCV 95 79 - 97 fL   MCH 33.0 26.6 - 33.0 pg   MCHC 34.6 31.5 - 35.7 g/dL   RDW 41.3 24.4 - 01.0 %   Platelets 112 (L) 150 - 450 x10E3/uL   Neutrophils 90 Not Estab. %   Lymphs 5 Not Estab. %   Monocytes 5 Not Estab. %   Eos 0 Not Estab. %   Basos 0 Not Estab. %   Neutrophils Absolute 7.4 (H) 1.4 - 7.0 x10E3/uL   Lymphocytes  Absolute 0.4 (L) 0.7 - 3.1 x10E3/uL   Monocytes Absolute 0.4 0.1 - 0.9 x10E3/uL   EOS (ABSOLUTE) 0.0 0.0 - 0.4 x10E3/uL   Basophils Absolute 0.0 0.0 - 0.2 x10E3/uL   Immature Granulocytes 0 Not Estab. %   Immature Grans (Abs) 0.0 0.0 - 0.1 x10E3/uL  Comprehensive metabolic panel     Status: Abnormal   Collection Time: 02/08/23 11:30 AM  Result Value Ref Range   Glucose 114 (H) 70 - 99 mg/dL   BUN 31 (H) 8 - 27 mg/dL   Creatinine, Ser 6.57 0.76 - 1.27 mg/dL   eGFR 64 >84 ON/GEX/5.28   BUN/Creatinine Ratio 26 (H) 10 - 24   Sodium 137 134 - 144 mmol/L   Potassium 4.3 3.5 - 5.2 mmol/L   Chloride 103 96 - 106 mmol/L   CO2 19 (L) 20 - 29 mmol/L   Calcium 9.1 8.6 - 10.2 mg/dL   Total Protein 6.9 6.0 - 8.5 g/dL   Albumin 4.2 3.8 - 4.8 g/dL   Globulin, Total 2.7 1.5 - 4.5 g/dL   Albumin/Globulin Ratio 1.6 1.2 - 2.2   Bilirubin Total 0.6 0.0 - 1.2 mg/dL   Alkaline Phosphatase 138 (H) 44 - 121 IU/L   AST 62 (H) 0 - 40 IU/L   ALT 41 0 - 44 IU/L  Lipid Panel w/o Chol/HDL Ratio     Status: None   Collection Time: 02/08/23  11:30 AM  Result Value Ref Range   Cholesterol, Total 142 100 - 199 mg/dL   Triglycerides 413 0 - 149 mg/dL   HDL 46 >24 mg/dL   VLDL Cholesterol Cal 23 5 - 40 mg/dL   LDL Chol Calc (NIH) 73 0 - 99 mg/dL  CK     Status: Abnormal   Collection Time: 02/08/23 11:30 AM  Result Value Ref Range   Total CK 34 (L) 41 - 331 U/L  Magnesium     Status: Abnormal   Collection Time: 02/28/23  8:21 AM  Result Value Ref Range   Magnesium 1.2 (L) 1.7 - 2.4 mg/dL    Comment: Performed at [facility], [address] [place], [place] [address]  Comprehensive metabolic panel     Status: Abnormal   Collection Time: 02/28/23  8:21 AM  Result Value Ref Range   Sodium 138 135 - 145 mmol/L   Potassium 4.1 3.5 - 5.1 mmol/L   Chloride 107 98 - 111 mmol/L   CO2 23 22 - 32 mmol/L   Glucose, Bld 103 (H) 70 - 99 mg/dL    Comment: Glucose reference range applies only to samples taken after fasting for at least 8 hours.   BUN 26 (H) 8 - 23 mg/dL   Creatinine, Ser 7.25 0.61 - 1.24 mg/dL   Calcium 8.6 (L) 8.9 - 10.3 mg/dL   Total Protein 7.1 6.5 - 8.1 g/dL   Albumin 3.8 3.5 - 5.0 g/dL   AST 27 15 - 41 U/L   ALT 13 0 - 44 U/L   Alkaline Phosphatase 101 38 - 126 U/L   Total Bilirubin 0.6 0.3 - 1.2 mg/dL   GFR, Estimated >36 >64 mL/min    Comment: (NOTE) Calculated using the CKD-EPI Creatinine Equation (2021)    Anion gap 8 5 - 15    Comment: Performed at [facility], [address] [place], [place] [address]  CBC with Differential     Status: Abnormal   Collection Time: 02/28/23  8:21 AM  Result Value Ref  Range   WBC 4.2 4.0 - 10.5 K/uL   RBC 3.89 (L) 4.22 - 5.81 MIL/uL   Hemoglobin 12.6 (L) 13.0 - 17.0 g/dL   HCT 16.1 (L) 09.6 - 04.5 %   MCV 95.6 80.0 - 100.0 fL   MCH 32.4 26.0 - 34.0 pg   MCHC 33.9 30.0 - 36.0 g/dL   RDW 40.9 81.1 - 91.4 %   Platelets 105 (L) 150 - 400 K/uL    Comment: SPECIMEN CHECKED FOR CLOTS   nRBC 0.0 0.0 - 0.2 %   Neutrophils Relative % 65 %   Neutro Abs  2.7 1.7 - 7.7 K/uL   Lymphocytes Relative 17 %   Lymphs Abs 0.7 0.7 - 4.0 K/uL   Monocytes Relative 11 %   Monocytes Absolute 0.5 0.1 - 1.0 K/uL   Eosinophils Relative 5 %   Eosinophils Absolute 0.2 0.0 - 0.5 K/uL   Basophils Relative 1 %   Basophils Absolute 0.0 0.0 - 0.1 K/uL   Immature Granulocytes 1 %   Abs Immature Granulocytes 0.03 0.00 - 0.07 K/uL    Comment: Performed at [facility], [address] [place], [place] [address]  Glucose, capillary     Status: None   Collection Time: 03/16/23 12:13 PM  Result Value Ref Range   Glucose-Capillary 91 70 - 99 mg/dL    Comment: Glucose reference range applies only to samples taken after fasting for at least 8 hours.  CBC with Differential (Cancer Center Only)     Status: Abnormal   Collection Time: 03/23/23  8:48 AM  Result Value Ref Range   WBC Count 5.5 4.0 - 10.5 K/uL   RBC 3.89 (L) 4.22 - 5.81 MIL/uL   Hemoglobin 12.3 (L) 13.0 - 17.0 g/dL   HCT 62.1 (L) 30.8 - 65.7 %   MCV 91.8 80.0 - 100.0 fL   MCH 31.6 26.0 - 34.0 pg   MCHC 34.5 30.0 - 36.0 g/dL   RDW 84.6 96.2 - 95.2 %   Platelet Count 155 150 - 400 K/uL   nRBC 0.0 0.0 - 0.2 %   Neutrophils Relative % 68 %   Neutro Abs 3.8 1.7 - 7.7 K/uL   Lymphocytes Relative 16 %   Lymphs Abs 0.9 0.7 - 4.0 K/uL   Monocytes Relative 10 %   Monocytes Absolute 0.6 0.1 - 1.0 K/uL   Eosinophils Relative 4 %   Eosinophils Absolute 0.2 0.0 - 0.5 K/uL   Basophils Relative 1 %   Basophils Absolute 0.0 0.0 - 0.1 K/uL   Immature Granulocytes 1 %   Abs Immature Granulocytes 0.03 0.00 - 0.07 K/uL    Comment: Performed at [facility], [address] [place], [place] [address]  Protein, urine, random     Status: None   Collection Time: 03/23/23  8:48 AM  Result Value Ref Range   Total Protein, Urine 14 mg/dL    Comment: NO NORMAL RANGE ESTABLISHED FOR THIS TEST Performed at [facility], [address] [place], [place] [address]   CBC with Differential (Cancer  Center Only)     Status: Abnormal   Collection Time: 04/06/23  8:08 AM  Result Value Ref Range   WBC Count 2.6 (L) 4.0 - 10.5 K/uL   RBC 3.40 (L) 4.22 - 5.81 MIL/uL   Hemoglobin 10.6 (L) 13.0 - 17.0 g/dL   HCT 27.2 (L) 53.6 - 64.4 %   MCV 90.0 80.0 - 100.0 fL   MCH 31.2 26.0 -  34.0 pg   MCHC 34.6 30.0 - 36.0 g/dL   RDW 16.1 09.6 - 04.5 %   Platelet Count 88 (L) 150 - 400 K/uL   nRBC 0.0 0.0 - 0.2 %   Neutrophils Relative % 67 %   Neutro Abs 1.7 1.7 - 7.7 K/uL   Lymphocytes Relative 25 %   Lymphs Abs 0.7 0.7 - 4.0 K/uL   Monocytes Relative 5 %   Monocytes Absolute 0.1 0.1 - 1.0 K/uL   Eosinophils Relative 2 %   Eosinophils Absolute 0.1 0.0 - 0.5 K/uL   Basophils Relative 1 %   Basophils Absolute 0.0 0.0 - 0.1 K/uL   Immature Granulocytes 0 %   Abs Immature Granulocytes 0.01 0.00 - 0.07 K/uL    Comment: Performed at [facility], [address] [place], [place] [address]  CEA     Status: Abnormal   Collection Time: 04/06/23  8:08 AM  Result Value Ref Range   CEA 8.4 (H) 0.0 - 4.7 ng/mL    Comment: (NOTE)                             Nonsmokers          <3.9                             Smokers             <5.6 [employer] Electrochemiluminescence Immunoassay (ECLIA) Values obtained with different assay methods or kits cannot be used interchangeably.  Results cannot be interpreted as absolute evidence of the presence or absence of malignant disease. Performed At: [facility] [address] [place], [place] [id number] Jolene Schimke MD [id number]   CBC with Differential (Cancer Center Only)     Status: Abnormal   Collection Time: 04/20/23  8:31 AM  Result Value Ref Range   WBC Count 1.8 (L) 4.0 - 10.5 K/uL   RBC 3.58 (L) 4.22 - 5.81 MIL/uL   Hemoglobin 11.2 (L) 13.0 - 17.0 g/dL   HCT 96.2 (L) 95.2 - 84.1 %   MCV 92.7 80.0 - 100.0 fL   MCH 31.3 26.0 - 34.0 pg   MCHC 33.7 30.0 - 36.0 g/dL   RDW 32.4 (H) 40.1 - 02.7 %   Platelet Count 109 (L) 150  - 400 K/uL   nRBC 0.0 0.0 - 0.2 %   Neutrophils Relative % 39 %   Neutro Abs 0.7 (L) 1.7 - 7.7 K/uL   Lymphocytes Relative 39 %   Lymphs Abs 0.7 0.7 - 4.0 K/uL   Monocytes Relative 18 %   Monocytes Absolute 0.3 0.1 - 1.0 K/uL   Eosinophils Relative 2 %   Eosinophils Absolute 0.0 0.0 - 0.5 K/uL   Basophils Relative 1 %   Basophils Absolute 0.0 0.0 - 0.1 K/uL   Immature Granulocytes 1 %   Abs Immature Granulocytes 0.01 0.00 - 0.07 K/uL    Comment: Performed at [facility], [address] [place], [place] [address]  CBC with Differential (Cancer Center Only)     Status: Abnormal   Collection Time: 05/04/23  8:26 AM  Result Value Ref Range   WBC Count 2.6 (L) 4.0 - 10.5 K/uL   RBC 3.17 (L) 4.22 - 5.81 MIL/uL   Hemoglobin 9.9 (L) 13.0 - 17.0 g/dL   HCT 44.0 (L) 34.7 - 42.5 %   MCV 90.9  80.0 - 100.0 fL   MCH 31.2 26.0 - 34.0 pg   MCHC 34.4 30.0 - 36.0 g/dL   RDW 95.2 84.1 - 32.4 %   Platelet Count 84 (L) 150 - 400 K/uL   nRBC 0.0 0.0 - 0.2 %   Neutrophils Relative % 64 %   Neutro Abs 1.7 1.7 - 7.7 K/uL   Lymphocytes Relative 27 %   Lymphs Abs 0.7 0.7 - 4.0 K/uL   Monocytes Relative 6 %   Monocytes Absolute 0.2 0.1 - 1.0 K/uL   Eosinophils Relative 2 %   Eosinophils Absolute 0.0 0.0 - 0.5 K/uL   Basophils Relative 1 %   Basophils Absolute 0.0 0.0 - 0.1 K/uL   Immature Granulocytes 0 %   Abs Immature Granulocytes 0.01 0.00 - 0.07 K/uL    Comment: Performed at [facility], [address] [place], [place] [address]  Protein, urine, random     Status: None   Collection Time: 05/04/23  8:50 AM  Result Value Ref Range   Total Protein, Urine 13 mg/dL    Comment: NO NORMAL RANGE ESTABLISHED FOR THIS TEST Performed at [facility], [address] [place], [place] [address]   Comprehensive metabolic panel     Status: Abnormal   Collection Time: 05/04/23  8:56 AM  Result Value Ref Range   Sodium 136 135 - 145 mmol/L   Potassium 4.1 3.5 - 5.1 mmol/L    Chloride 104 98 - 111 mmol/L   CO2 22 22 - 32 mmol/L   Glucose, Bld 99 70 - 99 mg/dL    Comment: Glucose reference range applies only to samples taken after fasting for at least 8 hours.   BUN 39 (H) 8 - 23 mg/dL   Creatinine, Ser 6.44 (H) 0.61 - 1.24 mg/dL   Calcium 8.4 (L) 8.9 - 10.3 mg/dL   Total Protein 6.9 6.5 - 8.1 g/dL   Albumin 3.7 3.5 - 5.0 g/dL   AST 32 15 - 41 U/L   ALT 20 0 - 44 U/L   Alkaline Phosphatase 128 (H) 38 - 126 U/L   Total Bilirubin 0.8 0.3 - 1.2 mg/dL   GFR, Estimated 60 (L) >60 mL/min    Comment: (NOTE) Calculated using the CKD-EPI Creatinine Equation (2021)    Anion gap 10 5 - 15    Comment: Performed at [facility], [address] [place], [place] [address]      Assessment & Plan:   Problem List Items Addressed This Visit   None   No follow-ups on file.   Total time spent: 30 minutes  Luna Fuse, MD  05/06/2023   This document may have been prepared by [facility] Voice Recognition software and as such may include unintentional dictation errors.

## 2023-05-16 ENCOUNTER — Other Ambulatory Visit: Payer: Self-pay | Admitting: Oncology

## 2023-05-18 ENCOUNTER — Inpatient Hospital Stay: Payer: Medicare HMO | Attending: Oncology

## 2023-05-18 ENCOUNTER — Encounter: Payer: Self-pay | Admitting: Oncology

## 2023-05-18 ENCOUNTER — Other Ambulatory Visit: Payer: Self-pay | Admitting: *Deleted

## 2023-05-18 ENCOUNTER — Inpatient Hospital Stay: Payer: Medicare HMO

## 2023-05-18 ENCOUNTER — Inpatient Hospital Stay (HOSPITAL_BASED_OUTPATIENT_CLINIC_OR_DEPARTMENT_OTHER): Payer: Medicare HMO | Admitting: Oncology

## 2023-05-18 VITALS — BP 140/82 | HR 64 | Temp 96.8°F | Resp 18 | Ht 67.0 in | Wt 159.6 lb

## 2023-05-18 DIAGNOSIS — Z79899 Other long term (current) drug therapy: Secondary | ICD-10-CM | POA: Diagnosis not present

## 2023-05-18 DIAGNOSIS — D702 Other drug-induced agranulocytosis: Secondary | ICD-10-CM | POA: Diagnosis not present

## 2023-05-18 DIAGNOSIS — C772 Secondary and unspecified malignant neoplasm of intra-abdominal lymph nodes: Secondary | ICD-10-CM | POA: Insufficient documentation

## 2023-05-18 DIAGNOSIS — C189 Malignant neoplasm of colon, unspecified: Secondary | ICD-10-CM

## 2023-05-18 DIAGNOSIS — D701 Agranulocytosis secondary to cancer chemotherapy: Secondary | ICD-10-CM | POA: Diagnosis not present

## 2023-05-18 DIAGNOSIS — Z452 Encounter for adjustment and management of vascular access device: Secondary | ICD-10-CM | POA: Diagnosis not present

## 2023-05-18 DIAGNOSIS — Z5112 Encounter for antineoplastic immunotherapy: Secondary | ICD-10-CM | POA: Insufficient documentation

## 2023-05-18 DIAGNOSIS — C187 Malignant neoplasm of sigmoid colon: Secondary | ICD-10-CM | POA: Diagnosis not present

## 2023-05-18 LAB — CBC WITH DIFFERENTIAL (CANCER CENTER ONLY)
Abs Immature Granulocytes: 0.01 10*3/uL (ref 0.00–0.07)
Basophils Absolute: 0 10*3/uL (ref 0.0–0.1)
Basophils Relative: 1 %
Eosinophils Absolute: 0 10*3/uL (ref 0.0–0.5)
Eosinophils Relative: 1 %
HCT: 30 % — ABNORMAL LOW (ref 39.0–52.0)
Hemoglobin: 10.1 g/dL — ABNORMAL LOW (ref 13.0–17.0)
Immature Granulocytes: 1 %
Lymphocytes Relative: 46 %
Lymphs Abs: 0.6 10*3/uL — ABNORMAL LOW (ref 0.7–4.0)
MCH: 31.9 pg (ref 26.0–34.0)
MCHC: 33.7 g/dL (ref 30.0–36.0)
MCV: 94.6 fL (ref 80.0–100.0)
Monocytes Absolute: 0.2 10*3/uL (ref 0.1–1.0)
Monocytes Relative: 16 %
Neutro Abs: 0.5 10*3/uL — ABNORMAL LOW (ref 1.7–7.7)
Neutrophils Relative %: 35 %
Platelet Count: 85 10*3/uL — ABNORMAL LOW (ref 150–400)
RBC: 3.17 MIL/uL — ABNORMAL LOW (ref 4.22–5.81)
RDW: 17.3 % — ABNORMAL HIGH (ref 11.5–15.5)
WBC Count: 1.4 10*3/uL — ABNORMAL LOW (ref 4.0–10.5)
nRBC: 0 % (ref 0.0–0.2)

## 2023-05-18 LAB — CMP (CANCER CENTER ONLY)
ALT: 12 U/L (ref 0–44)
AST: 21 U/L (ref 15–41)
Albumin: 3.6 g/dL (ref 3.5–5.0)
Alkaline Phosphatase: 83 U/L (ref 38–126)
Anion gap: 8 (ref 5–15)
BUN: 25 mg/dL — ABNORMAL HIGH (ref 8–23)
CO2: 23 mmol/L (ref 22–32)
Calcium: 8.3 mg/dL — ABNORMAL LOW (ref 8.9–10.3)
Chloride: 109 mmol/L (ref 98–111)
Creatinine: 1.01 mg/dL (ref 0.61–1.24)
GFR, Estimated: >60 mL/min (ref >60)
Glucose, Bld: 94 mg/dL (ref 70–99)
Potassium: 3.7 mmol/L (ref 3.5–5.1)
Sodium: 140 mmol/L (ref 135–145)
Total Bilirubin: 0.6 mg/dL (ref 0.3–1.2)
Total Protein: 6.7 g/dL (ref 6.5–8.1)

## 2023-05-18 LAB — PROTEIN, URINE, RANDOM: Total Protein, Urine: 40 mg/dL

## 2023-05-18 MED ORDER — TRIFLURIDINE-TIPIRACIL 20-8.19 MG PO TABS
40.0000 mg | ORAL_TABLET | Freq: Two times a day (BID) | ORAL | 3 refills | Status: DC
Start: 2023-05-18 — End: 2023-06-22

## 2023-05-18 MED ORDER — HEPARIN SOD (PORK) LOCK FLUSH 100 UNIT/ML IV SOLN
500.0000 [IU] | Freq: Once | INTRAVENOUS | Status: AC
  Administered 2023-05-18: 500 [IU] via INTRAVENOUS
  Filled 2023-05-18: qty 5

## 2023-05-18 MED ORDER — SODIUM CHLORIDE 0.9% FLUSH
10.0000 mL | INTRAVENOUS | Status: DC | PRN
  Administered 2023-05-18: 10 mL via INTRAVENOUS
  Filled 2023-05-18: qty 10

## 2023-05-18 MED ORDER — PREGABALIN 75 MG PO CAPS: ORAL_CAPSULE | ORAL | 0 refills | Status: DC

## 2023-05-18 NOTE — Progress Notes (Signed)
Me refill

## 2023-05-18 NOTE — Progress Notes (Signed)
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 He currently smokes 0.5ppd. he presented to the ER with symptoms of left sided chest pain and left arm pain. Troponin was negative ekg was unremarkable. Ct chest showed no PE. He was incidentally noted to have mediastinal and retrocrural adenopathy and a 2.1X2.3X4.4 cm retroaortic soft tissue lesion in the posterior left chest. 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  Low-grade activity in the left lateral fifth and sixth ribs associated with nondisplaced fractures likely reflecting healing response problem malignancy.   Patient underwent CT-guided biopsy of the retroperitoneal lymph node pathology showed metastatic adenocarcinoma compatible with colorectal origin.  CK7 negative.  CK20 positive.  CDX 2+.  TTF-1 negative.  PSA negative.  This pattern of immunoreactivity supports the above diagnosis. Patient underwent colonoscopy which showed sigmoid mass that was consistent with adenocarcinoma.  RAS panel testing showed that he was wild-type for both K-ras and BRAF   FOLFOX and bevacizumab chemotherapy started in July 2020.  Subsequently oxaliplatin has been on hold given neuropathy.  He was switched to irinotecan Xeloda panitumumab  regimen in August 2022.  Scans in October 2023  have shown slow increase in the size of intra-abdominal adenopathy but patient continues to have low-volume disease.  Patient received palliative radiation therapy to the enlarging external iliac lymph nodes.  Repeat lymph node biopsy could not be obtained due to location for foundation 1 testing.  Peripheral blood NGS testing did not show any actionable mutations    Interval history-patient feels well overall and denies any specific complaints at this time.  Appetite and weight have remained stable.  Denies any nausea vomiting or diarrhea  ECOG PS- 0 Pain scale- 0   Review of systems- Review of Systems  Constitutional:  Negative for chills, fever, malaise/fatigue and weight loss.  HENT:  Negative for congestion, ear discharge and nosebleeds.   Eyes:  Negative for blurred vision.  Respiratory:  Negative for cough, hemoptysis, sputum production, shortness of breath and wheezing.   Cardiovascular:  Negative for chest pain, palpitations, orthopnea and claudication.  Gastrointestinal:  Negative for abdominal pain, blood in stool, constipation, diarrhea, heartburn, melena, nausea and vomiting.  Genitourinary:  Negative for dysuria, flank pain, frequency, hematuria and urgency.  Musculoskeletal:  Negative for back pain, joint pain and myalgias.  Skin:  Negative for rash.  Neurological:  Negative for dizziness, tingling, focal weakness, seizures, weakness and headaches.  Endo/Heme/Allergies:  Does not bruise/bleed easily.  Psychiatric/Behavioral:  Negative for depression and suicidal ideas. The patient does not have insomnia.       No Known Allergies   Past Medical History:  Diagnosis Date   Colon cancer (HCC)    COPD (chronic obstructive pulmonary disease) (HCC)    Hyperlipidemia      Past Surgical History:  Procedure Laterality Date  COLONOSCOPY WITH PROPOFOL N/A 06/26/2019   Procedure: COLONOSCOPY WITH PROPOFOL;  Surgeon: Wyline Mood,  MD;  Location: [facility] ENDOSCOPY;  Service: Gastroenterology;  Laterality: N/A;   ESOPHAGOGASTRODUODENOSCOPY (EGD) WITH PROPOFOL N/A 01/12/2023   Procedure: ESOPHAGOGASTRODUODENOSCOPY (EGD) WITH PROPOFOL;  Surgeon: Wyline Mood, MD;  Location: [facility] ENDOSCOPY;  Service: Gastroenterology;  Laterality: N/A;   FLEXIBLE SIGMOIDOSCOPY N/A 05/07/2021   Procedure: FLEXIBLE SIGMOIDOSCOPY;  Surgeon: Wyline Mood, MD;  Location: [facility] ENDOSCOPY;  Service: Gastroenterology;  Laterality: N/A;   PORTA CATH INSERTION N/A 06/25/2019   Procedure: PORTA CATH INSERTION;  Surgeon: Annice Needy, MD;  Location: [facility] INVASIVE CV LAB;  Service: Cardiovascular;  Laterality: N/A;   PORTA CATH INSERTION Left 08/11/2020   Procedure: PORTA CATH INSERTION;  Surgeon: Annice Needy, MD;  Location: [facility] INVASIVE CV LAB;  Service: Cardiovascular;  Laterality: Left;   PORTA CATH REMOVAL Right 08/11/2020   Procedure: PORTA CATH REMOVAL;  Surgeon: Annice Needy, MD;  Location: [facility] INVASIVE CV LAB;  Service: Cardiovascular;  Laterality: Right;    Social History   Socioeconomic History   Marital status: Single    Spouse name: Not on file   Number of children: Not on file   Years of education: Not on file   Highest education level: Not on file  Occupational History   Not on file  Tobacco Use   Smoking status: Every Day    Packs/day: 0.50    Years: 40.00    Additional pack years: 0.00    Total pack years: 20.00    Types: Cigarettes   Smokeless tobacco: Never   Tobacco comments:    smoking 40 years  Vaping Use   Vaping Use: Never used  Substance and Sexual Activity   Alcohol use: No   Drug use: No   Sexual activity: Not Currently  Other Topics Concern   Not on file  Social History Narrative   Not on file   Social Determinants of Health   Financial Resource Strain: Low Risk  (06/12/2019)   Overall Financial Resource Strain (CARDIA)    Difficulty of Paying Living Expenses: Not hard at all  Food Insecurity: No Food  Insecurity (06/12/2019)   Hunger Vital Sign    Worried About Running Out of Food in the Last Year: Never true    Ran Out of Food in the Last Year: Never true  Transportation Needs: No Transportation Needs (06/12/2019)   PRAPARE - Administrator, Civil Service (Medical): No    Lack of Transportation (Non-Medical): No  Physical Activity: Not on file  Stress: No Stress Concern Present (06/12/2019)   [employer] of Occupational Health - Occupational Stress Questionnaire    Feeling of Stress : Not at all  Social Connections: Unknown (06/12/2019)   Social Connection and Isolation Panel [NHANES]    Frequency of Communication with Friends and Family: More than three times a week    Frequency of Social Gatherings with Friends and Family: Not on file    Attends Religious Services: Not on file    Active Member of Clubs or Organizations: Not on file    Attends Banker Meetings: Not on file    Marital Status: Not on file  Intimate Partner Violence: Not At Risk (06/12/2019)   Humiliation, Afraid, Rape, and Kick questionnaire    Fear of Current or Ex-Partner: No    Emotionally Abused: No    Physically Abused: No    Sexually Abused: No    Family History  Problem Relation Age of Onset   Brain cancer Father      Current Outpatient Medications:    aspirin 81 MG chewable tablet, Chew by mouth., Disp: , Rfl:    aspirin EC 81 MG tablet, Take 81 mg by mouth daily., Disp: , Rfl:    azelastine (ASTELIN) 0.1 % nasal spray, Place 1 spray into both nostrils daily as needed., Disp: , Rfl:    budesonide-formoterol (SYMBICORT) 80-4.5 MCG/ACT inhaler, USE 2 INHALATION BY MOUTH TWICE DAILY, Disp: 10.2 g, Rfl: 2   cetirizine (ZYRTEC) 10 MG tablet, Take 1 tablet (10 mg total) by mouth daily., Disp: 30 tablet, Rfl: 0   cyclobenzaprine (FLEXERIL) 10 MG tablet, TAKE 1 TABLET(10 MG) BY MOUTH THREE TIMES DAILY AS NEEDED FOR MUSCLE SPASMS, Disp: 42 tablet, Rfl: 0   dexlansoprazole (DEXILANT)  60 MG capsule, Take 1 capsule (60 mg total) by mouth every morning., Disp: 90 capsule, Rfl: 1   dexlansoprazole (DEXILANT) 60 MG capsule, Take by mouth., Disp: , Rfl:    DULoxetine (CYMBALTA) 30 MG capsule, Take 1 capsule (30 mg total) by mouth 2 (two) times daily., Disp: 60 capsule, Rfl: 1   lidocaine-prilocaine (EMLA) cream, Apply 1 Application topically as needed (pt will put 1 inch of cream over port 1 hourfor each treatment, cover plastic over the cream protect clothes)., Disp: 30 g, Rfl: 3   lisinopril (ZESTRIL) 5 MG tablet, Take 1 tablet (5 mg total) by mouth daily., Disp: 90 tablet, Rfl: 1   montelukast (SINGULAIR) 10 MG tablet, Take 10 mg by mouth at bedtime., Disp: , Rfl:    NEXLETOL 180 MG TABS, Take 1 tablet (180 mg total) by mouth daily., Disp: 90 tablet, Rfl: 1   nicotine (NICODERM CQ - DOSED IN MG/24 HOURS) 21 mg/24hr patch, Place onto the skin., Disp: , Rfl:    pregabalin (LYRICA) 75 MG capsule, TAKE 1 CAPSULE BY MOUTH EVERY MORNING AND TAKE 2 CAPSULES AT BEDTIME, Disp: 90 capsule, Rfl: 0   simvastatin (ZOCOR) 20 MG tablet, Take 1 tablet (20 mg total) by mouth every evening., Disp: 90 tablet, Rfl: 1   tamsulosin (FLOMAX) 0.4 MG CAPS capsule, Take 0.4 mg by mouth. , Disp: , Rfl:    triamcinolone (NASACORT ALLERGY 24HR) 55 MCG/ACT AERO nasal inhaler, Place 2 sprays into the nose daily., Disp: 60 each, Rfl: 2   trifluridine-tipiracil (LONSURF) 20-8.19 MG tablet, Take 2 tablets (40 mg of trifluridine total) by mouth 2 (two) times daily. 1hr after AM & PM meals days 1-5, 8-12. Repeat every 28d., Disp: 60 tablet, Rfl: 3 No current facility-administered medications for this visit.  Facility-Administered Medications Ordered in Other Visits:    atropine 1 MG/ML injection, , , ,    heparin lock flush 100 UNIT/ML injection, , , ,    sodium chloride flush (NS) 0.9 % injection 10 mL, 10 mL, Intravenous, PRN, Creig Hines, MD, 10 mL at 05/18/23 0824  Physical exam:  Vitals:   05/18/23 0834   BP: (!) 140/82  Pulse: 64  Resp: 18  Temp: (!) 96.8 F (36 C)  TempSrc: Tympanic  SpO2: 100%  Weight: 159 lb 9.6 oz (72.4 kg)  Height: 5\' 7"  (1.702 m)   Physical Exam Cardiovascular:     Rate and Rhythm: Normal rate and regular rhythm.     Heart sounds: Normal heart sounds.  Pulmonary:     Effort: Pulmonary effort is normal.     Breath sounds: Normal breath sounds.  Abdominal:  General: Bowel sounds are normal.     Palpations: Abdomen is soft.  Skin:    General: Skin is warm and dry.  Neurological:     Mental Status: He is alert and oriented to person, place, and time.         Latest Ref Rng & Units 05/18/2023    8:19 AM  CMP  Glucose 70 - 99 mg/dL 94   BUN 8 - 23 mg/dL 25   Creatinine 1.61 - 1.24 mg/dL 0.96   Sodium 045 - 409 mmol/L 140   Potassium 3.5 - 5.1 mmol/L 3.7   Chloride 98 - 111 mmol/L 109   CO2 22 - 32 mmol/L 23   Calcium 8.9 - 10.3 mg/dL 8.3   Total Protein 6.5 - 8.1 g/dL 6.7   Total Bilirubin 0.3 - 1.2 mg/dL 0.6   Alkaline Phos 38 - 126 U/L 83   AST 15 - 41 U/L 21   ALT 0 - 44 U/L 12       Latest Ref Rng & Units 05/18/2023    8:19 AM  CBC  WBC 4.0 - 10.5 K/uL 1.4   Hemoglobin 13.0 - 17.0 g/dL 81.1   Hematocrit 91.4 - 52.0 % 30.0   Platelets 150 - 400 K/uL 85      Assessment and plan- Patient is a 72 y.o. male with metastatic colon cancer with lymph node metastases.  He is here for on treatment assessment prior to cycle 3-day 1 of a Avastin  White cell count is 1.4 today with an ANC of 0.5.  I am holding off on giving him a Avastin today.  I also asked him to stop his Lonsurf for this week.  He is currently on 3 tablets twice a day dosing and he will lowered the dosing down to 2 tablets twice a day starting next week.  But this will be decided based on his CBC next week.  Patient gets a Avastin day 1 and day 15 with each cycle and I will plan to add udenyca with day 15 of each cycle.  Blood pressure is mildly elevated at 140/82 as compared to  his prior readings.  Continue to monitor  He will directly proceed for cycle 3-day 1 of a Avastin in 1 week and day 15 of a Avastin 3 weeks.  I will see him back 5 weeks from now for cycle 4-day 1 of a Avastin   Visit Diagnosis 1. 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)   2. Drug-induced neutropenia (HCC)   3. High risk medication use   4. Encounter for monoclonal antibody treatment for malignancy      Dr. Owens Shark, MD, MPH [facility] at [facility] [phone] 05/18/2023 8:56 AM

## 2023-05-19 LAB — CEA: CEA: 6.6 ng/mL — ABNORMAL HIGH (ref 0.0–4.7)

## 2023-05-25 ENCOUNTER — Inpatient Hospital Stay: Payer: Medicare HMO

## 2023-05-25 ENCOUNTER — Other Ambulatory Visit: Payer: Self-pay | Admitting: Oncology

## 2023-05-25 VITALS — BP 140/76 | HR 76 | Temp 96.4°F | Resp 18

## 2023-05-25 DIAGNOSIS — Z5112 Encounter for antineoplastic immunotherapy: Secondary | ICD-10-CM | POA: Diagnosis not present

## 2023-05-25 DIAGNOSIS — Z452 Encounter for adjustment and management of vascular access device: Secondary | ICD-10-CM | POA: Diagnosis not present

## 2023-05-25 DIAGNOSIS — C772 Secondary and unspecified malignant neoplasm of intra-abdominal lymph nodes: Secondary | ICD-10-CM | POA: Diagnosis not present

## 2023-05-25 DIAGNOSIS — C189 Malignant neoplasm of colon, unspecified: Secondary | ICD-10-CM

## 2023-05-25 DIAGNOSIS — C187 Malignant neoplasm of sigmoid colon: Secondary | ICD-10-CM | POA: Diagnosis not present

## 2023-05-25 DIAGNOSIS — D701 Agranulocytosis secondary to cancer chemotherapy: Secondary | ICD-10-CM | POA: Diagnosis not present

## 2023-05-25 LAB — CBC WITH DIFFERENTIAL (CANCER CENTER ONLY)
Abs Immature Granulocytes: 0.02 10*3/uL (ref 0.00–0.07)
Basophils Absolute: 0 10*3/uL (ref 0.0–0.1)
Basophils Relative: 0 %
Eosinophils Absolute: 0 10*3/uL (ref 0.0–0.5)
Eosinophils Relative: 1 %
HCT: 31.6 % — ABNORMAL LOW (ref 39.0–52.0)
Hemoglobin: 10.7 g/dL — ABNORMAL LOW (ref 13.0–17.0)
Immature Granulocytes: 1 %
Lymphocytes Relative: 29 %
Lymphs Abs: 0.8 10*3/uL (ref 0.7–4.0)
MCH: 31.8 pg (ref 26.0–34.0)
MCHC: 33.9 g/dL (ref 30.0–36.0)
MCV: 93.8 fL (ref 80.0–100.0)
Monocytes Absolute: 0.3 10*3/uL (ref 0.1–1.0)
Monocytes Relative: 12 %
Neutro Abs: 1.6 10*3/uL — ABNORMAL LOW (ref 1.7–7.7)
Neutrophils Relative %: 57 %
Platelet Count: 81 10*3/uL — ABNORMAL LOW (ref 150–400)
RBC: 3.37 MIL/uL — ABNORMAL LOW (ref 4.22–5.81)
RDW: 17 % — ABNORMAL HIGH (ref 11.5–15.5)
WBC Count: 2.8 10*3/uL — ABNORMAL LOW (ref 4.0–10.5)
nRBC: 0 % (ref 0.0–0.2)

## 2023-05-25 LAB — PROTEIN, URINE, RANDOM: Total Protein, Urine: 30 mg/dL

## 2023-05-25 MED ORDER — HEPARIN SOD (PORK) LOCK FLUSH 100 UNIT/ML IV SOLN
500.0000 [IU] | Freq: Once | INTRAVENOUS | Status: AC | PRN
  Administered 2023-05-25: 500 [IU]
  Filled 2023-05-25: qty 5

## 2023-05-25 MED ORDER — SODIUM CHLORIDE 0.9 % IV SOLN
5.0000 mg/kg | Freq: Once | INTRAVENOUS | Status: AC
  Administered 2023-05-25: 350 mg via INTRAVENOUS
  Filled 2023-05-25: qty 14

## 2023-05-25 MED ORDER — SODIUM CHLORIDE 0.9 % IV SOLN
Freq: Once | INTRAVENOUS | Status: AC
  Filled 2023-05-25: qty 250

## 2023-05-31 ENCOUNTER — Other Ambulatory Visit (HOSPITAL_COMMUNITY): Payer: Self-pay

## 2023-06-01 ENCOUNTER — Ambulatory Visit: Payer: Medicare HMO

## 2023-06-01 ENCOUNTER — Other Ambulatory Visit: Payer: Medicare HMO

## 2023-06-03 ENCOUNTER — Other Ambulatory Visit (HOSPITAL_COMMUNITY): Payer: Self-pay

## 2023-06-08 ENCOUNTER — Inpatient Hospital Stay: Payer: Medicare HMO

## 2023-06-08 VITALS — BP 144/66 | HR 72 | Temp 96.8°F | Resp 18 | Ht 67.0 in | Wt 154.3 lb

## 2023-06-08 DIAGNOSIS — C189 Malignant neoplasm of colon, unspecified: Secondary | ICD-10-CM

## 2023-06-08 DIAGNOSIS — D701 Agranulocytosis secondary to cancer chemotherapy: Secondary | ICD-10-CM | POA: Diagnosis not present

## 2023-06-08 DIAGNOSIS — C772 Secondary and unspecified malignant neoplasm of intra-abdominal lymph nodes: Secondary | ICD-10-CM | POA: Diagnosis not present

## 2023-06-08 DIAGNOSIS — C187 Malignant neoplasm of sigmoid colon: Secondary | ICD-10-CM | POA: Diagnosis not present

## 2023-06-08 DIAGNOSIS — Z452 Encounter for adjustment and management of vascular access device: Secondary | ICD-10-CM | POA: Diagnosis not present

## 2023-06-08 DIAGNOSIS — Z5112 Encounter for antineoplastic immunotherapy: Secondary | ICD-10-CM | POA: Diagnosis not present

## 2023-06-08 LAB — CBC WITH DIFFERENTIAL (CANCER CENTER ONLY)
Abs Immature Granulocytes: 0.02 10*3/uL (ref 0.00–0.07)
Basophils Absolute: 0 10*3/uL (ref 0.0–0.1)
Basophils Relative: 0 %
Eosinophils Absolute: 0.1 10*3/uL (ref 0.0–0.5)
Eosinophils Relative: 3 %
HCT: 28.8 % — ABNORMAL LOW (ref 39.0–52.0)
Hemoglobin: 9.9 g/dL — ABNORMAL LOW (ref 13.0–17.0)
Immature Granulocytes: 1 %
Lymphocytes Relative: 30 %
Lymphs Abs: 0.8 10*3/uL (ref 0.7–4.0)
MCH: 32 pg (ref 26.0–34.0)
MCHC: 34.4 g/dL (ref 30.0–36.0)
MCV: 93.2 fL (ref 80.0–100.0)
Monocytes Absolute: 0.2 10*3/uL (ref 0.1–1.0)
Monocytes Relative: 8 %
Neutro Abs: 1.5 10*3/uL — ABNORMAL LOW (ref 1.7–7.7)
Neutrophils Relative %: 58 %
Platelet Count: 94 10*3/uL — ABNORMAL LOW (ref 150–400)
RBC: 3.09 MIL/uL — ABNORMAL LOW (ref 4.22–5.81)
RDW: 16.9 % — ABNORMAL HIGH (ref 11.5–15.5)
WBC Count: 2.6 10*3/uL — ABNORMAL LOW (ref 4.0–10.5)
nRBC: 0 % (ref 0.0–0.2)

## 2023-06-08 MED ORDER — SODIUM CHLORIDE 0.9 % IV SOLN
5.0000 mg/kg | Freq: Once | INTRAVENOUS | Status: AC
  Administered 2023-06-08: 350 mg via INTRAVENOUS
  Filled 2023-06-08: qty 14

## 2023-06-08 MED ORDER — HEPARIN SOD (PORK) LOCK FLUSH 100 UNIT/ML IV SOLN
500.0000 [IU] | Freq: Once | INTRAVENOUS | Status: AC | PRN
  Administered 2023-06-08: 500 [IU]
  Filled 2023-06-08: qty 5

## 2023-06-08 MED ORDER — SODIUM CHLORIDE 0.9 % IV SOLN
Freq: Once | INTRAVENOUS | Status: AC
  Filled 2023-06-08: qty 250

## 2023-06-08 NOTE — Patient Instructions (Signed)
[place] [facility]  Discharge Instructions: Thank you for choosing [facility] to provide your oncology and hematology care.  If you have a lab appointment with the Cancer Center, please go directly to the Cancer Center and check in at the registration area.  Wear comfortable clothing and clothing appropriate for easy access to any Portacath or PICC line.   We strive to give you quality time with your provider. You may need to reschedule your appointment if you arrive late (15 or more minutes).  Arriving late affects you and other patients whose appointments are after yours.  Also, if you miss three or more appointments without notifying the office, you may be dismissed from the clinic at the provider's discretion.      For prescription refill requests, have your pharmacy contact our office and allow 72 hours for refills to be completed.    Today you received the following chemotherapy and/or immunotherapy agents MVASI      To help prevent nausea and vomiting after your treatment, we encourage you to take your nausea medication as directed.  BELOW ARE SYMPTOMS THAT SHOULD BE REPORTED IMMEDIATELY: *FEVER GREATER THAN 100.4 F (38 C) OR HIGHER *CHILLS OR SWEATING *NAUSEA AND VOMITING THAT IS NOT CONTROLLED WITH YOUR NAUSEA MEDICATION *UNUSUAL SHORTNESS OF BREATH *UNUSUAL BRUISING OR BLEEDING *URINARY PROBLEMS (pain or burning when urinating, or frequent urination) *BOWEL PROBLEMS (unusual diarrhea, constipation, pain near the anus) TENDERNESS IN MOUTH AND THROAT WITH OR WITHOUT PRESENCE OF ULCERS (sore throat, sores in mouth, or a toothache) UNUSUAL RASH, SWELLING OR PAIN  UNUSUAL VAGINAL DISCHARGE OR ITCHING   Items with * indicate a potential emergency and should be followed up as soon as possible or go to the Emergency Department if any problems should occur.  Please show the CHEMOTHERAPY ALERT CARD or IMMUNOTHERAPY ALERT CARD at check-in to the  Emergency Department and triage nurse.  Should you have questions after your visit or need to cancel or reschedule your appointment, please contact [place] [facility]  [phone] and follow the prompts.  Office hours are 8:00 a.m. to 4:30 p.m. Monday - Friday. Please note that voicemails left after 4:00 p.m. may not be returned until the following business day.  We are closed weekends and major holidays. You have access to a nurse at all times for urgent questions. Please call the main number to the clinic [phone] and follow the prompts.  For any non-urgent questions, you may also contact your provider using MyChart. We now offer e-Visits for anyone 2 and older to request care online for non-urgent symptoms. For details visit [email or web address].   Also download the MyChart app! Go to the app store, search "MyChart", open the app, select [facility], and log in with your MyChart username and password.   Bevacizumab Injection What is this medication? BEVACIZUMAB (be va SIZ yoo mab) treats some types of cancer. It works by blocking a protein that causes cancer cells to grow and multiply. This helps to slow or stop the spread of cancer cells. It is a monoclonal antibody. This medicine may be used for other purposes; ask your health care provider or pharmacist if you have questions. COMMON BRAND NAME(S): Alymsys, Avastin, MVASI, Omer Jack What should I tell my care team before I take this medication? They need to know if you have any of these conditions: Blood clots Coughing up blood Having or recent surgery Heart failure High blood pressure History of a  connection between 2 or more body parts that do not usually connect (fistula) History of a tear in your stomach or intestines Protein in your urine An unusual or allergic reaction to bevacizumab, other medications, foods, dyes, or preservatives Pregnant or trying to get pregnant Breast-feeding How should I  use this medication? This medication is injected into a vein. It is given by your care team in a hospital or clinic setting. Talk to your care team the use of this medication in children. Special care may be needed. Overdosage: If you think you have taken too much of this medicine contact a poison control center or emergency room at once. NOTE: This medicine is only for you. Do not share this medicine with others. What if I miss a dose? Keep appointments for follow-up doses. It is important not to miss your dose. Call your care team if you are unable to keep an appointment. What may interact with this medication? Interactions are not expected. This list may not describe all possible interactions. Give your health care provider a list of all the medicines, herbs, non-prescription drugs, or dietary supplements you use. Also tell them if you smoke, drink alcohol, or use illegal drugs. Some items may interact with your medicine. What should I watch for while using this medication? Your condition will be monitored carefully while you are receiving this medication. You may need blood work while taking this medication. This medication may make you feel generally unwell. This is not uncommon as chemotherapy can affect healthy cells as well as cancer cells. Report any side effects. Continue your course of treatment even though you feel ill unless your care team tells you to stop. This medication may increase your risk to bruise or bleed. Call your care team if you notice any unusual bleeding. Before having surgery, talk to your care team to make sure it is ok. This medication can increase the risk of poor healing of your surgical site or wound. You will need to stop this medication for 28 days before surgery. After surgery, wait at least 28 days before restarting this medication. Make sure the surgical site or wound is healed enough before restarting this medication. Talk to your care team if questions. Talk  to your care team if you may be pregnant. Serious birth defects can occur if you take this medication during pregnancy and for 6 months after the last dose. Contraception is recommended while taking this medication and for 6 months after the last dose. Your care team can help you find the option that works for you. Do not breastfeed while taking this medication and for 6 months after the last dose. This medication can cause infertility. Talk to your care team if you are concerned about your fertility. What side effects may I notice from receiving this medication? Side effects that you should report to your care team as soon as possible: Allergic reactions--skin rash, itching, hives, swelling of the face, lips, tongue, or throat Bleeding--bloody or black, tar-like stools, vomiting blood or brown material that looks like coffee grounds, red or dark brown urine, small red or purple spots on skin, unusual bruising or bleeding Blood clot--pain, swelling, or warmth in the leg, shortness of breath, chest pain Heart attack--pain or tightness in the chest, shoulders, arms, or jaw, nausea, shortness of breath, cold or clammy skin, feeling faint or lightheaded Heart failure--shortness of breath, swelling of the ankles, feet, or hands, sudden weight gain, unusual weakness or fatigue Increase in blood pressure Infection--fever, chills,  cough, sore throat, wounds that don't heal, pain or trouble when passing urine, general feeling of discomfort or being unwell Infusion reactions--chest pain, shortness of breath or trouble breathing, feeling faint or lightheaded Kidney injury--decrease in the amount of urine, swelling of the ankles, hands, or feet Stomach pain that is severe, does not go away, or gets worse Stroke--sudden numbness or weakness of the face, arm, or leg, trouble speaking, confusion, trouble walking, loss of balance or coordination, dizziness, severe headache, change in vision Sudden and severe  headache, confusion, change in vision, seizures, which may be signs of posterior reversible encephalopathy syndrome (PRES) Side effects that usually do not require medical attention (report to your care team if they continue or are bothersome): Back pain Change in taste Diarrhea Dry skin Increased tears Nosebleed This list may not describe all possible side effects. Call your doctor for medical advice about side effects. You may report side effects to FDA at 1-800-FDA-1088. Where should I keep my medication? This medication is given in a hospital or clinic. It will not be stored at home. NOTE: This sheet is a summary. It may not cover all possible information. If you have questions about this medicine, talk to your doctor, pharmacist, or health care provider.  2024 Elsevier/Gold Standard (2022-04-16 00:00:00)

## 2023-06-09 ENCOUNTER — Other Ambulatory Visit: Payer: Self-pay | Admitting: Pharmacist

## 2023-06-10 ENCOUNTER — Inpatient Hospital Stay: Payer: Medicare HMO

## 2023-06-10 DIAGNOSIS — C189 Malignant neoplasm of colon, unspecified: Secondary | ICD-10-CM

## 2023-06-10 DIAGNOSIS — C187 Malignant neoplasm of sigmoid colon: Secondary | ICD-10-CM | POA: Diagnosis not present

## 2023-06-10 DIAGNOSIS — C772 Secondary and unspecified malignant neoplasm of intra-abdominal lymph nodes: Secondary | ICD-10-CM | POA: Diagnosis not present

## 2023-06-10 DIAGNOSIS — D701 Agranulocytosis secondary to cancer chemotherapy: Secondary | ICD-10-CM | POA: Diagnosis not present

## 2023-06-10 DIAGNOSIS — Z452 Encounter for adjustment and management of vascular access device: Secondary | ICD-10-CM | POA: Diagnosis not present

## 2023-06-10 DIAGNOSIS — Z5112 Encounter for antineoplastic immunotherapy: Secondary | ICD-10-CM | POA: Diagnosis not present

## 2023-06-10 MED ORDER — PEGFILGRASTIM-CBQV 6 MG/0.6ML ~~LOC~~ SOSY
6.0000 mg | PREFILLED_SYRINGE | Freq: Once | SUBCUTANEOUS | Status: AC
  Administered 2023-06-10: 6 mg via SUBCUTANEOUS
  Filled 2023-06-10: qty 0.6

## 2023-06-13 ENCOUNTER — Other Ambulatory Visit: Payer: Self-pay | Admitting: Oncology

## 2023-06-13 ENCOUNTER — Other Ambulatory Visit: Payer: Self-pay | Admitting: Internal Medicine

## 2023-06-14 ENCOUNTER — Other Ambulatory Visit: Payer: Self-pay | Admitting: Oncology

## 2023-06-14 ENCOUNTER — Other Ambulatory Visit (HOSPITAL_COMMUNITY): Payer: Self-pay

## 2023-06-14 DIAGNOSIS — C189 Malignant neoplasm of colon, unspecified: Secondary | ICD-10-CM

## 2023-06-15 ENCOUNTER — Other Ambulatory Visit: Payer: Medicare HMO

## 2023-06-15 ENCOUNTER — Ambulatory Visit: Payer: Medicare HMO | Admitting: Medical Oncology

## 2023-06-15 ENCOUNTER — Ambulatory Visit: Payer: Medicare HMO

## 2023-06-15 ENCOUNTER — Other Ambulatory Visit (HOSPITAL_COMMUNITY): Payer: Self-pay

## 2023-06-17 ENCOUNTER — Other Ambulatory Visit (HOSPITAL_COMMUNITY): Payer: Self-pay

## 2023-06-17 ENCOUNTER — Other Ambulatory Visit: Payer: Self-pay

## 2023-06-17 MED ORDER — LONSURF 20-8.19 MG PO TABS
35.00 mg/m2 | ORAL_TABLET | Freq: Two times a day (BID) | ORAL | 3 refills | Status: AC
Start: 2023-06-17 — End: ?
  Filled 2023-06-17: qty 60, 28d supply, fill #0

## 2023-06-22 ENCOUNTER — Inpatient Hospital Stay (HOSPITAL_BASED_OUTPATIENT_CLINIC_OR_DEPARTMENT_OTHER): Payer: Medicare HMO | Admitting: Oncology

## 2023-06-22 ENCOUNTER — Other Ambulatory Visit: Payer: Self-pay | Admitting: *Deleted

## 2023-06-22 ENCOUNTER — Encounter: Payer: Self-pay | Admitting: Oncology

## 2023-06-22 ENCOUNTER — Inpatient Hospital Stay: Payer: Medicare HMO | Attending: Oncology

## 2023-06-22 ENCOUNTER — Inpatient Hospital Stay: Payer: Medicare HMO

## 2023-06-22 ENCOUNTER — Other Ambulatory Visit (HOSPITAL_COMMUNITY): Payer: Self-pay

## 2023-06-22 ENCOUNTER — Other Ambulatory Visit: Payer: Self-pay

## 2023-06-22 VITALS — BP 150/74 | HR 80 | Temp 97.5°F | Ht 67.0 in | Wt 155.4 lb

## 2023-06-22 DIAGNOSIS — C772 Secondary and unspecified malignant neoplasm of intra-abdominal lymph nodes: Secondary | ICD-10-CM

## 2023-06-22 DIAGNOSIS — C189 Malignant neoplasm of colon, unspecified: Secondary | ICD-10-CM

## 2023-06-22 DIAGNOSIS — D6959 Other secondary thrombocytopenia: Secondary | ICD-10-CM

## 2023-06-22 DIAGNOSIS — Z5112 Encounter for antineoplastic immunotherapy: Secondary | ICD-10-CM | POA: Insufficient documentation

## 2023-06-22 DIAGNOSIS — G629 Polyneuropathy, unspecified: Secondary | ICD-10-CM | POA: Diagnosis not present

## 2023-06-22 DIAGNOSIS — D701 Agranulocytosis secondary to cancer chemotherapy: Secondary | ICD-10-CM | POA: Insufficient documentation

## 2023-06-22 DIAGNOSIS — D6481 Anemia due to antineoplastic chemotherapy: Secondary | ICD-10-CM | POA: Insufficient documentation

## 2023-06-22 DIAGNOSIS — T50905A Adverse effect of unspecified drugs, medicaments and biological substances, initial encounter: Secondary | ICD-10-CM

## 2023-06-22 DIAGNOSIS — C786 Secondary malignant neoplasm of retroperitoneum and peritoneum: Secondary | ICD-10-CM | POA: Diagnosis not present

## 2023-06-22 DIAGNOSIS — C187 Malignant neoplasm of sigmoid colon: Secondary | ICD-10-CM | POA: Diagnosis not present

## 2023-06-22 LAB — CBC WITH DIFFERENTIAL (CANCER CENTER ONLY)
Abs Immature Granulocytes: 0.02 10*3/uL (ref 0.00–0.07)
Basophils Absolute: 0 10*3/uL (ref 0.0–0.1)
Basophils Relative: 0 %
Eosinophils Absolute: 0 10*3/uL (ref 0.0–0.5)
Eosinophils Relative: 1 %
HCT: 27.3 % — ABNORMAL LOW (ref 39.0–52.0)
Hemoglobin: 8.9 g/dL — ABNORMAL LOW (ref 13.0–17.0)
Immature Granulocytes: 1 %
Lymphocytes Relative: 17 %
Lymphs Abs: 0.5 10*3/uL — ABNORMAL LOW (ref 0.7–4.0)
MCH: 32.6 pg (ref 26.0–34.0)
MCHC: 32.6 g/dL (ref 30.0–36.0)
MCV: 100 fL (ref 80.0–100.0)
Monocytes Absolute: 0.3 10*3/uL (ref 0.1–1.0)
Monocytes Relative: 9 %
Neutro Abs: 2.3 10*3/uL (ref 1.7–7.7)
Neutrophils Relative %: 72 %
Platelet Count: 55 10*3/uL — ABNORMAL LOW (ref 150–400)
RBC: 2.73 MIL/uL — ABNORMAL LOW (ref 4.22–5.81)
RDW: 20.9 % — ABNORMAL HIGH (ref 11.5–15.5)
WBC Count: 3.1 10*3/uL — ABNORMAL LOW (ref 4.0–10.5)
nRBC: 0 % (ref 0.0–0.2)

## 2023-06-22 LAB — CMP (CANCER CENTER ONLY)
ALT: 9 U/L (ref 0–44)
AST: 20 U/L (ref 15–41)
Albumin: 3.7 g/dL (ref 3.5–5.0)
Alkaline Phosphatase: 84 U/L (ref 38–126)
Anion gap: 7 (ref 5–15)
BUN: 22 mg/dL (ref 8–23)
CO2: 22 mmol/L (ref 22–32)
Calcium: 8.3 mg/dL — ABNORMAL LOW (ref 8.9–10.3)
Chloride: 111 mmol/L (ref 98–111)
Creatinine: 0.88 mg/dL (ref 0.61–1.24)
GFR, Estimated: >60 mL/min (ref >60)
Glucose, Bld: 115 mg/dL — ABNORMAL HIGH (ref 70–99)
Potassium: 3.7 mmol/L (ref 3.5–5.1)
Sodium: 140 mmol/L (ref 135–145)
Total Bilirubin: 0.6 mg/dL (ref 0.3–1.2)
Total Protein: 6.8 g/dL (ref 6.5–8.1)

## 2023-06-22 MED ORDER — TRIFLURIDINE-TIPIRACIL 20-8.19 MG PO TABS
40.0000 mg | ORAL_TABLET | Freq: Two times a day (BID) | ORAL | 3 refills | Status: DC
Start: 2023-06-22 — End: 2023-08-17
  Filled 2023-06-22: qty 40, 10d supply, fill #0
  Filled 2023-07-25: qty 40, 28d supply, fill #0

## 2023-06-22 MED ORDER — HEPARIN SOD (PORK) LOCK FLUSH 100 UNIT/ML IV SOLN
500.0000 [IU] | Freq: Once | INTRAVENOUS | Status: AC
  Administered 2023-06-22: 500 [IU] via INTRAVENOUS
  Filled 2023-06-22: qty 5

## 2023-06-22 NOTE — Progress Notes (Signed)
Hematology/Oncology Consult note [facility]  [phone] [phone]  Patient Care Team: [name], MD as PCP - General (Internal Medicine) Benita Gutter, RN as Oncology Nurse Navigator Creig Hines, MD as Consulting Physician (Hematology and Oncology)   Name of the patient: Eric Simon  [id number]  10-10-1951   Date of visit: 06/22/23  Diagnosis- metastatic colon cancer with lymph node metastases   Chief complaint/ Reason for visit-on treatment assessment prior to cycle 4-day 1 of a Avastin  Heme/Onc history:  Patient is a 72 yr old male with >40 pack year history of smoking. He currently smokes 0.5ppd. he presented to the ER with symptoms of left sided chest pain and left arm pain. Troponin was negative ekg was unremarkable. Ct chest showed no PE. He was incidentally noted to have mediastinal and retrocrural adenopathy and a 2.1X2.3X4.4 cm retroaortic soft tissue lesion in the posterior left chest. 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  Low-grade activity in the left lateral fifth and sixth ribs associated with nondisplaced fractures likely reflecting healing response problem malignancy.   Patient underwent CT-guided biopsy of the retroperitoneal lymph node pathology showed metastatic adenocarcinoma compatible with colorectal origin.  CK7 negative.  CK20 positive.  CDX 2+.  TTF-1 negative.  PSA negative.  This pattern of immunoreactivity supports the above diagnosis. Patient underwent colonoscopy which showed sigmoid mass that was consistent with adenocarcinoma.  RAS panel testing showed that he was wild-type for both K-ras and BRAF   FOLFOX and bevacizumab chemotherapy started in July 2020.  Subsequently oxaliplatin has been on hold given neuropathy.  He was switched to irinotecan Xeloda panitumumab regimen in  August 2022.  Scans in October 2023  have shown slow increase in the size of intra-abdominal adenopathy but patient continues to have low-volume disease.  Patient received palliative radiation therapy to the enlarging external iliac lymph nodes.  Repeat lymph node biopsy could not be obtained due to location for foundation 1 testing.  Peripheral blood NGS testing did not show any actionable mutations.  Further disease progression noted in the intra-abdominal lymph nodes on PET scan and patient was switched to Lonsurf Avastin regimen in April 2024    Interval history-patient is tolerating Lonsurf well without any significant side effects.  Denies any nausea vomiting or diarrhea.  Denies any recent fever or infections.  Patient does not wish to take his treatment today because he has some plumbing issues at home  ECOG PS- 0 Pain scale- 0  Review of systems- Review of Systems  Constitutional:  Positive for malaise/fatigue. Negative for chills, fever and weight loss.  HENT:  Negative for congestion, ear discharge and nosebleeds.   Eyes:  Negative for blurred vision.  Respiratory:  Negative for cough, hemoptysis, sputum production, shortness of breath and wheezing.   Cardiovascular:  Negative for chest pain, palpitations, orthopnea and claudication.  Gastrointestinal:  Negative for abdominal pain, blood in stool, constipation, diarrhea, heartburn, melena, nausea and vomiting.  Genitourinary:  Negative for dysuria, flank pain, frequency, hematuria and urgency.  Musculoskeletal:  Negative for back pain, joint pain and myalgias.  Skin:  Negative for rash.  Neurological:  Negative for dizziness, tingling, focal weakness, seizures, weakness and headaches.  Endo/Heme/Allergies:  Does not bruise/bleed easily.  Psychiatric/Behavioral:  Negative for depression and suicidal ideas. The patient does not have insomnia.       No  Known Allergies   Past Medical History:  Diagnosis Date   Colon cancer (HCC)     COPD (chronic obstructive pulmonary disease) (HCC)    Hyperlipidemia      Past Surgical History:  Procedure Laterality Date   COLONOSCOPY WITH PROPOFOL N/A 06/26/2019   Procedure: COLONOSCOPY WITH PROPOFOL;  Surgeon: Wyline Mood, MD;  Location: [facility] ENDOSCOPY;  Service: Gastroenterology;  Laterality: N/A;   ESOPHAGOGASTRODUODENOSCOPY (EGD) WITH PROPOFOL N/A 01/12/2023   Procedure: ESOPHAGOGASTRODUODENOSCOPY (EGD) WITH PROPOFOL;  Surgeon: Wyline Mood, MD;  Location: [facility] ENDOSCOPY;  Service: Gastroenterology;  Laterality: N/A;   FLEXIBLE SIGMOIDOSCOPY N/A 05/07/2021   Procedure: FLEXIBLE SIGMOIDOSCOPY;  Surgeon: Wyline Mood, MD;  Location: [facility] ENDOSCOPY;  Service: Gastroenterology;  Laterality: N/A;   PORTA CATH INSERTION N/A 06/25/2019   Procedure: PORTA CATH INSERTION;  Surgeon: Annice Needy, MD;  Location: [facility] INVASIVE CV LAB;  Service: Cardiovascular;  Laterality: N/A;   PORTA CATH INSERTION Left 08/11/2020   Procedure: PORTA CATH INSERTION;  Surgeon: Annice Needy, MD;  Location: [facility] INVASIVE CV LAB;  Service: Cardiovascular;  Laterality: Left;   PORTA CATH REMOVAL Right 08/11/2020   Procedure: PORTA CATH REMOVAL;  Surgeon: Annice Needy, MD;  Location: [facility] INVASIVE CV LAB;  Service: Cardiovascular;  Laterality: Right;    Social History   Socioeconomic History   Marital status: Single    Spouse name: Not on file   Number of children: Not on file   Years of education: Not on file   Highest education level: Not on file  Occupational History   Not on file  Tobacco Use   Smoking status: Every Day    Packs/day: 0.50    Years: 40.00    Additional pack years: 0.00    Total pack years: 20.00    Types: Cigarettes   Smokeless tobacco: Never   Tobacco comments:    smoking 40 years  Vaping Use   Vaping Use: Never used  Substance and Sexual Activity   Alcohol use: No   Drug use: No   Sexual activity: Not Currently  Other Topics Concern   Not on file  Social History Narrative    Not on file   Social Determinants of Health   Financial Resource Strain: Low Risk  (06/12/2019)   Overall Financial Resource Strain (CARDIA)    Difficulty of Paying Living Expenses: Not hard at all  Food Insecurity: No Food Insecurity (06/12/2019)   Hunger Vital Sign    Worried About Running Out of Food in the Last Year: Never true    Ran Out of Food in the Last Year: Never true  Transportation Needs: No Transportation Needs (06/12/2019)   PRAPARE - Administrator, Civil Service (Medical): No    Lack of Transportation (Non-Medical): No  Physical Activity: Not on file  Stress: No Stress Concern Present (06/12/2019)   [employer] of Occupational Health - Occupational Stress Questionnaire    Feeling of Stress : Not at all  Social Connections: Unknown (06/12/2019)   Social Connection and Isolation Panel [NHANES]    Frequency of Communication with Friends and Family: More than three times a week    Frequency of Social Gatherings with Friends and Family: Not on file    Attends Religious Services: Not on file    Active Member of Clubs or Organizations: Not on file    Attends Banker Meetings: Not on file    Marital Status: Not on file  Intimate Partner Violence: Not At Risk (  06/12/2019)   Humiliation, Afraid, Rape, and Kick questionnaire    Fear of Current or Ex-Partner: No    Emotionally Abused: No    Physically Abused: No    Sexually Abused: No    Family History  Problem Relation Age of Onset   Brain cancer Father      Current Outpatient Medications:    aspirin 81 MG chewable tablet, Chew by mouth., Disp: , Rfl:    aspirin EC 81 MG tablet, Take 81 mg by mouth daily., Disp: , Rfl:    azelastine (ASTELIN) 0.1 % nasal spray, Place 1 spray into both nostrils daily as needed., Disp: , Rfl:    budesonide-formoterol (SYMBICORT) 80-4.5 MCG/ACT inhaler, USE 2 INHALATION BY MOUTH TWICE DAILY, Disp: 10.2 g, Rfl: 2   cetirizine (ZYRTEC) 10 MG tablet, TAKE 1 TABLET  BY MOUTH DAILY, Disp: 90 tablet, Rfl: 0   cyclobenzaprine (FLEXERIL) 10 MG tablet, TAKE 1 TABLET(10 MG) BY MOUTH THREE TIMES DAILY AS NEEDED FOR MUSCLE SPASMS, Disp: 42 tablet, Rfl: 0   dexlansoprazole (DEXILANT) 60 MG capsule, Take 1 capsule (60 mg total) by mouth every morning., Disp: 90 capsule, Rfl: 1   dexlansoprazole (DEXILANT) 60 MG capsule, Take by mouth., Disp: , Rfl:    DULoxetine (CYMBALTA) 30 MG capsule, Take 1 capsule (30 mg total) by mouth 2 (two) times daily., Disp: 60 capsule, Rfl: 1   lidocaine-prilocaine (EMLA) cream, Apply 1 Application topically as needed (pt will put 1 inch of cream over port 1 hourfor each treatment, cover plastic over the cream protect clothes)., Disp: 30 g, Rfl: 3   lisinopril (ZESTRIL) 5 MG tablet, Take 1 tablet (5 mg total) by mouth daily., Disp: 90 tablet, Rfl: 1   montelukast (SINGULAIR) 10 MG tablet, Take 10 mg by mouth at bedtime., Disp: , Rfl:    NEXLETOL 180 MG TABS, Take 1 tablet (180 mg total) by mouth daily., Disp: 90 tablet, Rfl: 1   nicotine (NICODERM CQ - DOSED IN MG/24 HOURS) 21 mg/24hr patch, Place onto the skin., Disp: , Rfl:    pregabalin (LYRICA) 75 MG capsule, TAKE 1 CAPSULE BY MOUTH EVERY MORNING AND TAKE 2 CAPSULES AT BEDTIME, Disp: 90 capsule, Rfl: 1   simvastatin (ZOCOR) 20 MG tablet, Take 1 tablet (20 mg total) by mouth every evening., Disp: 90 tablet, Rfl: 1   tamsulosin (FLOMAX) 0.4 MG CAPS capsule, Take 0.4 mg by mouth. , Disp: , Rfl:    triamcinolone (NASACORT ALLERGY 24HR) 55 MCG/ACT AERO nasal inhaler, Place 2 sprays into the nose daily., Disp: 60 each, Rfl: 2   trifluridine-tipiracil (LONSURF) 20-8.19 MG tablet, Take 2 tablets (40 mg of trifluridine total) by mouth 2 (two) times daily. 1hr after AM & PM meals days 1-5, 8-12. Repeat every 28d., Disp: 40 tablet, Rfl: 3 No current facility-administered medications for this visit.  Facility-Administered Medications Ordered in Other Visits:    atropine 1 MG/ML injection, , , ,     heparin lock flush 100 UNIT/ML injection, , , ,   Physical exam:  Vitals:   06/22/23 0820  BP: (!) 150/74  Pulse: 80  Temp: (!) 97.5 F (36.4 C)  TempSrc: Tympanic  SpO2: 98%  Weight: 155 lb 6.4 oz (70.5 kg)  Height: 5\' 7"  (1.702 m)   Physical Exam Cardiovascular:     Rate and Rhythm: Normal rate and regular rhythm.     Heart sounds: Normal heart sounds.  Pulmonary:     Effort: Pulmonary effort is normal.  Breath sounds: Normal breath sounds.  Abdominal:     General: Bowel sounds are normal.     Palpations: Abdomen is soft.  Skin:    General: Skin is warm and dry.  Neurological:     Mental Status: He is alert and oriented to person, place, and time.         Latest Ref Rng & Units 06/22/2023    8:28 AM  CMP  Glucose 70 - 99 mg/dL 161   BUN 8 - 23 mg/dL 22   Creatinine 0.96 - 1.24 mg/dL 0.45   Sodium 409 - 811 mmol/L 140   Potassium 3.5 - 5.1 mmol/L 3.7   Chloride 98 - 111 mmol/L 111   CO2 22 - 32 mmol/L 22   Calcium 8.9 - 10.3 mg/dL 8.3   Total Protein 6.5 - 8.1 g/dL 6.8   Total Bilirubin 0.3 - 1.2 mg/dL 0.6   Alkaline Phos 38 - 126 U/L 84   AST 15 - 41 U/L 20   ALT 0 - 44 U/L 9       Latest Ref Rng & Units 06/22/2023    8:28 AM  CBC  WBC 4.0 - 10.5 K/uL 3.1   Hemoglobin 13.0 - 17.0 g/dL 8.9   Hematocrit 91.4 - 52.0 % 27.3   Platelets 150 - 400 K/uL 55     Assessment and plan- Patient is a 72 y.o. male with metastatic colon cancer with lymph node metastases.  He is here for on treatment assessment prior to cycle 4-day one Lonsurf and Avastin  Patient received Neulasta with the last cycle of Lonsurf and a Avastin and white cell count is better at 3.1 with an ANC more than 1.5.  Platelets are more than 50 and it would be okay to give him a Avastin and proceed with Lonsurf today.  However patient would like to defer his treatment by 2 weeks as he has some ongoing plumbing issues at home.  He will therefore not start his Lonsurf today and he will not be  getting a Avastin today.  Treatment to be deferred by 2 weeks.  Also patient is currently on Lonsurf 3 tablets twice a day and I plan to cut it down to 2 tablets twice a day moving forward.  I will see him back in 2 weeks.  Plan to repeat PET CT scan in about 2 to 3 weeks from now.  CEA is going down which is reassuring   Visit Diagnosis 1. 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)   2. Drug-induced thrombocytopenia      Dr. Owens Shark, MD, MPH [facility] [phone] 06/22/2023 12:50 PM

## 2023-06-29 ENCOUNTER — Other Ambulatory Visit: Payer: Medicare HMO

## 2023-06-29 ENCOUNTER — Ambulatory Visit: Payer: Medicare HMO

## 2023-07-06 ENCOUNTER — Encounter: Payer: Self-pay | Admitting: Oncology

## 2023-07-06 ENCOUNTER — Inpatient Hospital Stay (HOSPITAL_BASED_OUTPATIENT_CLINIC_OR_DEPARTMENT_OTHER): Payer: Medicare HMO | Admitting: Oncology

## 2023-07-06 ENCOUNTER — Other Ambulatory Visit: Payer: Medicare HMO

## 2023-07-06 ENCOUNTER — Inpatient Hospital Stay: Payer: Medicare HMO

## 2023-07-06 ENCOUNTER — Ambulatory Visit: Payer: Medicare HMO

## 2023-07-06 ENCOUNTER — Other Ambulatory Visit: Payer: Self-pay | Admitting: *Deleted

## 2023-07-06 VITALS — BP 142/82 | HR 71 | Temp 97.3°F | Resp 18 | Ht 67.0 in | Wt 151.8 lb

## 2023-07-06 VITALS — BP 149/83 | HR 63 | Resp 18

## 2023-07-06 DIAGNOSIS — C189 Malignant neoplasm of colon, unspecified: Secondary | ICD-10-CM

## 2023-07-06 DIAGNOSIS — Z5112 Encounter for antineoplastic immunotherapy: Secondary | ICD-10-CM | POA: Diagnosis not present

## 2023-07-06 DIAGNOSIS — D6959 Other secondary thrombocytopenia: Secondary | ICD-10-CM

## 2023-07-06 DIAGNOSIS — C786 Secondary malignant neoplasm of retroperitoneum and peritoneum: Secondary | ICD-10-CM | POA: Diagnosis not present

## 2023-07-06 DIAGNOSIS — D701 Agranulocytosis secondary to cancer chemotherapy: Secondary | ICD-10-CM | POA: Diagnosis not present

## 2023-07-06 DIAGNOSIS — D6481 Anemia due to antineoplastic chemotherapy: Secondary | ICD-10-CM | POA: Diagnosis not present

## 2023-07-06 DIAGNOSIS — C772 Secondary and unspecified malignant neoplasm of intra-abdominal lymph nodes: Secondary | ICD-10-CM

## 2023-07-06 DIAGNOSIS — T451X5A Adverse effect of antineoplastic and immunosuppressive drugs, initial encounter: Secondary | ICD-10-CM

## 2023-07-06 DIAGNOSIS — G629 Polyneuropathy, unspecified: Secondary | ICD-10-CM | POA: Diagnosis not present

## 2023-07-06 DIAGNOSIS — C187 Malignant neoplasm of sigmoid colon: Secondary | ICD-10-CM | POA: Diagnosis not present

## 2023-07-06 LAB — CMP (CANCER CENTER ONLY)
ALT: 12 U/L (ref 0–44)
AST: 23 U/L (ref 15–41)
Albumin: 3.8 g/dL (ref 3.5–5.0)
Alkaline Phosphatase: 80 U/L (ref 38–126)
Anion gap: 6 (ref 5–15)
BUN: 23 mg/dL (ref 8–23)
CO2: 23 mmol/L (ref 22–32)
Calcium: 8.5 mg/dL — ABNORMAL LOW (ref 8.9–10.3)
Chloride: 108 mmol/L (ref 98–111)
Creatinine: 0.89 mg/dL (ref 0.61–1.24)
GFR, Estimated: >60 mL/min (ref >60)
Glucose, Bld: 101 mg/dL — ABNORMAL HIGH (ref 70–99)
Potassium: 3.7 mmol/L (ref 3.5–5.1)
Sodium: 137 mmol/L (ref 135–145)
Total Bilirubin: 0.6 mg/dL (ref 0.3–1.2)
Total Protein: 7.2 g/dL (ref 6.5–8.1)

## 2023-07-06 LAB — CBC WITH DIFFERENTIAL (CANCER CENTER ONLY)
Abs Immature Granulocytes: 0.02 10*3/uL (ref 0.00–0.07)
Basophils Absolute: 0 10*3/uL (ref 0.0–0.1)
Basophils Relative: 1 %
Eosinophils Absolute: 0 10*3/uL (ref 0.0–0.5)
Eosinophils Relative: 1 %
HCT: 32.6 % — ABNORMAL LOW (ref 39.0–52.0)
Hemoglobin: 10.8 g/dL — ABNORMAL LOW (ref 13.0–17.0)
Immature Granulocytes: 0 %
Lymphocytes Relative: 17 %
Lymphs Abs: 0.8 10*3/uL (ref 0.7–4.0)
MCH: 33.3 pg (ref 26.0–34.0)
MCHC: 33.1 g/dL (ref 30.0–36.0)
MCV: 100.6 fL — ABNORMAL HIGH (ref 80.0–100.0)
Monocytes Absolute: 0.5 10*3/uL (ref 0.1–1.0)
Monocytes Relative: 11 %
Neutro Abs: 3.4 10*3/uL (ref 1.7–7.7)
Neutrophils Relative %: 70 %
Platelet Count: 107 10*3/uL — ABNORMAL LOW (ref 150–400)
RBC: 3.24 MIL/uL — ABNORMAL LOW (ref 4.22–5.81)
RDW: 18.1 % — ABNORMAL HIGH (ref 11.5–15.5)
WBC Count: 4.8 10*3/uL (ref 4.0–10.5)
nRBC: 0 % (ref 0.0–0.2)

## 2023-07-06 LAB — PROTEIN, URINE, RANDOM: Total Protein, Urine: 78 mg/dL

## 2023-07-06 MED ORDER — HEPARIN SOD (PORK) LOCK FLUSH 100 UNIT/ML IV SOLN
500.0000 [IU] | Freq: Once | INTRAVENOUS | Status: AC | PRN
  Administered 2023-07-06: 500 [IU]
  Filled 2023-07-06: qty 5

## 2023-07-06 MED ORDER — SODIUM CHLORIDE 0.9 % IV SOLN
Freq: Once | INTRAVENOUS | Status: AC
  Filled 2023-07-06: qty 250

## 2023-07-06 MED ORDER — SODIUM CHLORIDE 0.9 % IV SOLN
5.0000 mg/kg | Freq: Once | INTRAVENOUS | Status: AC
  Administered 2023-07-06: 350 mg via INTRAVENOUS
  Filled 2023-07-06: qty 14

## 2023-07-06 NOTE — Progress Notes (Signed)
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 He currently smokes 0.5ppd. he presented to the ER with symptoms of left sided chest pain and left arm pain. Troponin was negative ekg was unremarkable. Ct chest showed no PE. He was incidentally noted to have mediastinal and retrocrural adenopathy and a 2.1X2.3X4.4 cm retroaortic soft tissue lesion in the posterior left chest. 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  Low-grade activity in the left lateral fifth and sixth ribs associated with nondisplaced fractures likely reflecting healing response problem malignancy.   Patient underwent CT-guided biopsy of the retroperitoneal lymph node pathology showed metastatic adenocarcinoma compatible with colorectal origin.  CK7 negative.  CK20 positive.  CDX 2+.  TTF-1 negative.  PSA negative.  This pattern of immunoreactivity supports the above diagnosis. Patient underwent colonoscopy which showed sigmoid mass that was consistent with adenocarcinoma.  RAS panel testing showed that he was wild-type for both K-ras and BRAF   FOLFOX and bevacizumab chemotherapy started in July 2020.  Subsequently oxaliplatin has been on hold given neuropathy.  He was switched to irinotecan Xeloda panitumumab regimen in  August 2022.  Scans in October 2023  have shown slow increase in the size of intra-abdominal adenopathy but patient continues to have low-volume disease.  Patient received palliative radiation therapy to the enlarging external iliac lymph nodes.  Repeat lymph node biopsy could not be obtained due to location for foundation 1 testing.  Peripheral blood NGS testing did not show any actionable mutations.  Further disease progression noted in the intra-abdominal lymph nodes on PET scan and patient was switched to Lonsurf Avastin regimen in April 2024  Interval history-overall patient feels better after holding treatment for 2 more weeks.  He does report some bilateral ankle edema left greater than right.  Appetite and weight have remained stable.  Denies any pain presently  ECOG PS- 0 Pain scale- 0   Review of systems- Review of Systems  Constitutional:  Negative for chills, fever, malaise/fatigue and weight loss.  HENT:  Negative for congestion, ear discharge and nosebleeds.   Eyes:  Negative for blurred vision.  Respiratory:  Negative for cough, hemoptysis, sputum production, shortness of breath and wheezing.   Cardiovascular:  Negative for chest pain, palpitations, orthopnea and claudication.  Gastrointestinal:  Negative for abdominal pain, blood in stool, constipation, diarrhea, heartburn, melena, nausea and vomiting.  Genitourinary:  Negative for dysuria, flank pain, frequency, hematuria and urgency.  Musculoskeletal:  Negative for back pain, joint pain and myalgias.  Skin:  Negative for rash.  Neurological:  Negative for dizziness, tingling, focal weakness, seizures, weakness and headaches.  Endo/Heme/Allergies:  Does not bruise/bleed easily.  Psychiatric/Behavioral:  Negative for depression and suicidal ideas. The patient does not have insomnia.       No Known Allergies   Past Medical History:  Diagnosis Date  Colon cancer (HCC)    COPD (chronic obstructive pulmonary disease) (HCC)     Hyperlipidemia      Past Surgical History:  Procedure Laterality Date   COLONOSCOPY WITH PROPOFOL N/A 06/26/2019   Procedure: COLONOSCOPY WITH PROPOFOL;  Surgeon: Wyline Mood, MD;  Location: [facility] ENDOSCOPY;  Service: Gastroenterology;  Laterality: N/A;   ESOPHAGOGASTRODUODENOSCOPY (EGD) WITH PROPOFOL N/A 01/12/2023   Procedure: ESOPHAGOGASTRODUODENOSCOPY (EGD) WITH PROPOFOL;  Surgeon: Wyline Mood, MD;  Location: [facility] ENDOSCOPY;  Service: Gastroenterology;  Laterality: N/A;   FLEXIBLE SIGMOIDOSCOPY N/A 05/07/2021   Procedure: FLEXIBLE SIGMOIDOSCOPY;  Surgeon: Wyline Mood, MD;  Location: [facility] ENDOSCOPY;  Service: Gastroenterology;  Laterality: N/A;   PORTA CATH INSERTION N/A 06/25/2019   Procedure: PORTA CATH INSERTION;  Surgeon: Annice Needy, MD;  Location: [facility] INVASIVE CV LAB;  Service: Cardiovascular;  Laterality: N/A;   PORTA CATH INSERTION Left 08/11/2020   Procedure: PORTA CATH INSERTION;  Surgeon: Annice Needy, MD;  Location: [facility] INVASIVE CV LAB;  Service: Cardiovascular;  Laterality: Left;   PORTA CATH REMOVAL Right 08/11/2020   Procedure: PORTA CATH REMOVAL;  Surgeon: Annice Needy, MD;  Location: [facility] INVASIVE CV LAB;  Service: Cardiovascular;  Laterality: Right;    Social History   Socioeconomic History   Marital status: Single    Spouse name: Not on file   Number of children: Not on file   Years of education: Not on file   Highest education level: Not on file  Occupational History   Not on file  Tobacco Use   Smoking status: Every Day    Current packs/day: 0.50    Average packs/day: 0.5 packs/day for 40.0 years (20.0 ttl pk-yrs)    Types: Cigarettes   Smokeless tobacco: Never   Tobacco comments:    smoking 40 years  Vaping Use   Vaping status: Never Used  Substance and Sexual Activity   Alcohol use: No   Drug use: No   Sexual activity: Not Currently  Other Topics Concern   Not on file  Social History Narrative   Not on file   Social Determinants of Health    Financial Resource Strain: Low Risk  (03/02/2023)   Received from [facility], [facility]   Overall Financial Resource Strain (CARDIA)    Difficulty of Paying Living Expenses: Not very hard  Food Insecurity: No Food Insecurity (03/02/2023)   Received from [facility], [facility]   Hunger Vital Sign    Worried About Running Out of Food in the Last Year: Never true    Ran Out of Food in the Last Year: Never true  Transportation Needs: No Transportation Needs (03/02/2023)   Received from [facility], [facility]   [facility] - Transportation    Lack of Transportation (Medical): No    Lack of Transportation (Non-Medical): No  Physical Activity: Not on file  Stress: No Stress Concern Present (06/12/2019)   [employer] of Occupational Health - Occupational Stress Questionnaire    Feeling of Stress : Not at all  Social Connections: Unknown (06/12/2019)   Social Connection and Isolation Panel [NHANES]    Frequency of Communication with Friends and Family: More than three times a week    Frequency of Social Gatherings with Friends and Family: Not on file    Attends Religious Services: Not on file    Active Member of Clubs or Organizations: Not on file    Attends Banker Meetings: Not on file  Marital Status: Not on file  Intimate Partner Violence: Not At Risk (06/12/2019)   Humiliation, Afraid, Rape, and Kick questionnaire    Fear of Current or Ex-Partner: No    Emotionally Abused: No    Physically Abused: No    Sexually Abused: No    Family History  Problem Relation Age of Onset   Brain cancer Father      Current Outpatient Medications:    aspirin 81 MG chewable tablet, Chew by mouth., Disp: , Rfl:    aspirin EC 81 MG tablet, Take 81 mg by mouth daily., Disp: , Rfl:    azelastine (ASTELIN) 0.1 % nasal spray, Place 1 spray into both nostrils daily as needed., Disp: , Rfl:    budesonide-formoterol (SYMBICORT) 80-4.5 MCG/ACT inhaler, USE 2  INHALATION BY MOUTH TWICE DAILY, Disp: 10.2 g, Rfl: 2   cetirizine (ZYRTEC) 10 MG tablet, TAKE 1 TABLET BY MOUTH DAILY, Disp: 90 tablet, Rfl: 0   cyclobenzaprine (FLEXERIL) 10 MG tablet, TAKE 1 TABLET(10 MG) BY MOUTH THREE TIMES DAILY AS NEEDED FOR MUSCLE SPASMS, Disp: 42 tablet, Rfl: 0   dexlansoprazole (DEXILANT) 60 MG capsule, Take 1 capsule (60 mg total) by mouth every morning., Disp: 90 capsule, Rfl: 1   dexlansoprazole (DEXILANT) 60 MG capsule, Take by mouth., Disp: , Rfl:    DULoxetine (CYMBALTA) 30 MG capsule, Take 1 capsule (30 mg total) by mouth 2 (two) times daily., Disp: 60 capsule, Rfl: 1   lidocaine-prilocaine (EMLA) cream, Apply 1 Application topically as needed (pt will put 1 inch of cream over port 1 hourfor each treatment, cover plastic over the cream protect clothes)., Disp: 30 g, Rfl: 3   lisinopril (ZESTRIL) 5 MG tablet, Take 1 tablet (5 mg total) by mouth daily., Disp: 90 tablet, Rfl: 1   montelukast (SINGULAIR) 10 MG tablet, Take 10 mg by mouth at bedtime., Disp: , Rfl:    NEXLETOL 180 MG TABS, Take 1 tablet (180 mg total) by mouth daily., Disp: 90 tablet, Rfl: 1   nicotine (NICODERM CQ - DOSED IN MG/24 HOURS) 21 mg/24hr patch, Place onto the skin., Disp: , Rfl:    pregabalin (LYRICA) 75 MG capsule, TAKE 1 CAPSULE BY MOUTH EVERY MORNING AND TAKE 2 CAPSULES AT BEDTIME, Disp: 90 capsule, Rfl: 1   simvastatin (ZOCOR) 20 MG tablet, Take 1 tablet (20 mg total) by mouth every evening., Disp: 90 tablet, Rfl: 1   tamsulosin (FLOMAX) 0.4 MG CAPS capsule, Take 0.4 mg by mouth. , Disp: , Rfl:    triamcinolone (NASACORT ALLERGY 24HR) 55 MCG/ACT AERO nasal inhaler, Place 2 sprays into the nose daily., Disp: 60 each, Rfl: 2   trifluridine-tipiracil (LONSURF) 20-8.19 MG tablet, Take 2 tablets (40 mg of trifluridine total) by mouth 2 (two) times daily. 1hr after AM & PM meals days 1-5, 8-12. Repeat every 28d., Disp: 40 tablet, Rfl: 3 No current facility-administered medications for this  visit.  Facility-Administered Medications Ordered in Other Visits:    atropine 1 MG/ML injection, , , ,    bevacizumab-awwb (MVASI) 350 mg in sodium chloride 0.9 % 100 mL chemo infusion, 5 mg/kg (Order-Specific), Intravenous, Once, Owens Shark C, MD   heparin lock flush 100 UNIT/ML injection, , , ,    heparin lock flush 100 unit/mL, 500 Units, Intracatheter, Once PRN, Creig Hines, MD  Physical exam:  Vitals:   07/06/23 0825 07/06/23 0828  BP:  (!) 142/82  Pulse: 70 71  Resp: 18   Temp: (!) 97.3 F (36.3 C)  TempSrc: Tympanic   SpO2: 100%   Weight: 151 lb 12.8 oz (68.9 kg)   Height: 5\' 7"  (1.702 m)    Physical Exam Cardiovascular:     Rate and Rhythm: Normal rate and regular rhythm.     Heart sounds: Normal heart sounds.  Pulmonary:     Effort: Pulmonary effort is normal.     Breath sounds: Normal breath sounds.  Abdominal:     General: Bowel sounds are normal.     Palpations: Abdomen is soft.  Skin:    General: Skin is warm and dry.  Neurological:     Mental Status: He is alert and oriented to person, place, and time.         Latest Ref Rng & Units 07/06/2023    8:05 AM  CMP  Glucose 70 - 99 mg/dL 756   BUN 8 - 23 mg/dL 23   Creatinine 4.33 - 1.24 mg/dL 2.95   Sodium 188 - 416 mmol/L 137   Potassium 3.5 - 5.1 mmol/L 3.7   Chloride 98 - 111 mmol/L 108   CO2 22 - 32 mmol/L 23   Calcium 8.9 - 10.3 mg/dL 8.5   Total Protein 6.5 - 8.1 g/dL 7.2   Total Bilirubin 0.3 - 1.2 mg/dL 0.6   Alkaline Phos 38 - 126 U/L 80   AST 15 - 41 U/L 23   ALT 0 - 44 U/L 12       Latest Ref Rng & Units 07/06/2023    8:05 AM  CBC  WBC 4.0 - 10.5 K/uL 4.8   Hemoglobin 13.0 - 17.0 g/dL 60.6   Hematocrit 30.1 - 52.0 % 32.6   Platelets 150 - 400 K/uL 107       Assessment and plan- Patient is a 72 y.o. male with metastatic colon cancer with lymph node metastases.  He is here for on treatment assessment prior to cycle 4-day 1 of a Avastin and Lonsurf  Counts okay to proceed  with a Avastin today.  Hemoglobin is improved from 8.9-10.8 and platelets are up from 55-1 07 after holding Lonsurf for 2 more weeks.  He took 3 tablets of Lonsurf this morning but have asked him to take 2 tablets in the morning and 2 tablets in the afternoon instead given his pancytopenia associated with Lonsurf.  Patient has bilateral ankle edema which is mild and may be related to Lonsurf or Avastin.  Continue to monitor.  I also asked him to keep a tab of his blood pressure readings.  Plan to get a PET CT scan in about 2 to 3 weeks from now.  CEA will be added on to today's labs.  He will directly proceed for a Avastin 2 weeks and also receives Udenyca given his neutropenia associated with Lonsurf.  I will see him back in 4 weeks for cycle 5-day 1 of a Avastin and Lonsurf   Visit Diagnosis 1. Chemotherapy-induced thrombocytopenia   2. 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 Antineoplastic chemotherapy induced anemia   4. Encounter for monoclonal antibody treatment for malignancy      Dr. Owens Shark, MD, MPH [facility] [phone] 07/06/2023 9:15 AM

## 2023-07-06 NOTE — Patient Instructions (Signed)
[facility]  Discharge Instructions: Thank you for choosing [facility] to provide your oncology and hematology care.  If you have a lab appointment with the Cancer Center, please go directly to the Cancer Center and check in at the registration area.  Wear comfortable clothing and clothing appropriate for easy access to any Portacath or PICC line.   We strive to give you quality time with your provider. You may need to reschedule your appointment if you arrive late (15 or more minutes).  Arriving late affects you and other patients whose appointments are after yours.  Also, if you miss three or more appointments without notifying the office, you may be dismissed from the clinic at the provider's discretion.      For prescription refill requests, have your pharmacy contact our office and allow 72 hours for refills to be completed.    Today you received the following chemotherapy and/or immunotherapy agents Mvasi        To help prevent nausea and vomiting after your treatment, we encourage you to take your nausea medication as directed.  BELOW ARE SYMPTOMS THAT SHOULD BE REPORTED IMMEDIATELY: *FEVER GREATER THAN 100.4 F (38 C) OR HIGHER *CHILLS OR SWEATING *NAUSEA AND VOMITING THAT IS NOT CONTROLLED WITH YOUR NAUSEA MEDICATION *UNUSUAL SHORTNESS OF BREATH *UNUSUAL BRUISING OR BLEEDING *URINARY PROBLEMS (pain or burning when urinating, or frequent urination) *BOWEL PROBLEMS (unusual diarrhea, constipation, pain near the anus) TENDERNESS IN MOUTH AND THROAT WITH OR WITHOUT PRESENCE OF ULCERS (sore throat, sores in mouth, or a toothache) UNUSUAL RASH, SWELLING OR PAIN  UNUSUAL VAGINAL DISCHARGE OR ITCHING   Items with * indicate a potential emergency and should be followed up as soon as possible or go to the Emergency Department if any problems should occur.  Please show the CHEMOTHERAPY ALERT CARD or IMMUNOTHERAPY ALERT CARD at check-in to  the Emergency Department and triage nurse.  Should you have questions after your visit or need to cancel or reschedule your appointment, please contact [facility]  [phone] and follow the prompts.  Office hours are 8:00 a.m. to 4:30 p.m. Monday - Friday. Please note that voicemails left after 4:00 p.m. may not be returned until the following business day.  We are closed weekends and major holidays. You have access to a nurse at all times for urgent questions. Please call the main number to the clinic [phone] and follow the prompts.  For any non-urgent questions, you may also contact your provider using MyChart. We now offer e-Visits for anyone 25 and older to request care online for non-urgent symptoms. For details visit [email or web address].   Also download the MyChart app! Go to the app store, search "MyChart", open the app, select [place], and log in with your MyChart username and password.

## 2023-07-08 ENCOUNTER — Ambulatory Visit: Payer: Medicare HMO

## 2023-07-08 ENCOUNTER — Other Ambulatory Visit (HOSPITAL_COMMUNITY): Payer: Self-pay

## 2023-07-12 ENCOUNTER — Other Ambulatory Visit (HOSPITAL_COMMUNITY): Payer: Self-pay

## 2023-07-12 ENCOUNTER — Encounter (HOSPITAL_COMMUNITY): Payer: Self-pay

## 2023-07-13 ENCOUNTER — Other Ambulatory Visit: Payer: Self-pay

## 2023-07-14 ENCOUNTER — Ambulatory Visit
Admission: RE | Admit: 2023-07-14 | Discharge: 2023-07-14 | Disposition: A | Discharge status: Home / Self Care | Payer: Medicare HMO | Source: Ambulatory Visit | Attending: Oncology | Admitting: Oncology

## 2023-07-14 DIAGNOSIS — J439 Emphysema, unspecified: Secondary | ICD-10-CM | POA: Diagnosis not present

## 2023-07-14 DIAGNOSIS — C189 Malignant neoplasm of colon, unspecified: Secondary | ICD-10-CM | POA: Diagnosis not present

## 2023-07-14 DIAGNOSIS — C787 Secondary malignant neoplasm of liver and intrahepatic bile duct: Secondary | ICD-10-CM | POA: Diagnosis not present

## 2023-07-14 DIAGNOSIS — I2510 Atherosclerotic heart disease of native coronary artery without angina pectoris: Secondary | ICD-10-CM | POA: Diagnosis not present

## 2023-07-14 DIAGNOSIS — C772 Secondary and unspecified malignant neoplasm of intra-abdominal lymph nodes: Secondary | ICD-10-CM | POA: Diagnosis not present

## 2023-07-14 DIAGNOSIS — R918 Other nonspecific abnormal finding of lung field: Secondary | ICD-10-CM | POA: Diagnosis not present

## 2023-07-14 DIAGNOSIS — I700 Atherosclerosis of aorta: Secondary | ICD-10-CM | POA: Insufficient documentation

## 2023-07-14 LAB — GLUCOSE, CAPILLARY: Glucose-Capillary: 87 mg/dL (ref 70–99)

## 2023-07-14 MED ORDER — FLUDEOXYGLUCOSE F - 18 (FDG) INJECTION
7.3600 | Freq: Once | INTRAVENOUS | Status: AC | PRN
  Administered 2023-07-14: 7.36 via INTRAVENOUS

## 2023-07-20 ENCOUNTER — Ambulatory Visit: Payer: Medicare HMO | Admitting: Oncology

## 2023-07-20 ENCOUNTER — Inpatient Hospital Stay: Payer: Medicare HMO

## 2023-07-20 ENCOUNTER — Inpatient Hospital Stay: Payer: Medicare HMO | Attending: Oncology

## 2023-07-20 ENCOUNTER — Ambulatory Visit: Payer: Medicare HMO

## 2023-07-20 ENCOUNTER — Other Ambulatory Visit: Payer: Medicare HMO

## 2023-07-20 VITALS — BP 146/65 | HR 70 | Temp 97.3°F | Resp 18

## 2023-07-20 DIAGNOSIS — Z5189 Encounter for other specified aftercare: Secondary | ICD-10-CM | POA: Insufficient documentation

## 2023-07-20 DIAGNOSIS — C189 Malignant neoplasm of colon, unspecified: Secondary | ICD-10-CM

## 2023-07-20 DIAGNOSIS — Z5112 Encounter for antineoplastic immunotherapy: Secondary | ICD-10-CM | POA: Insufficient documentation

## 2023-07-20 DIAGNOSIS — C187 Malignant neoplasm of sigmoid colon: Secondary | ICD-10-CM | POA: Diagnosis not present

## 2023-07-20 LAB — CBC WITH DIFFERENTIAL (CANCER CENTER ONLY)
Abs Immature Granulocytes: 0.01 10*3/uL (ref 0.00–0.07)
Basophils Absolute: 0 10*3/uL (ref 0.0–0.1)
Basophils Relative: 0 %
Eosinophils Absolute: 0.1 10*3/uL (ref 0.0–0.5)
Eosinophils Relative: 3 %
HCT: 30.9 % — ABNORMAL LOW (ref 39.0–52.0)
Hemoglobin: 10.4 g/dL — ABNORMAL LOW (ref 13.0–17.0)
Immature Granulocytes: 0 %
Lymphocytes Relative: 22 %
Lymphs Abs: 0.5 10*3/uL — ABNORMAL LOW (ref 0.7–4.0)
MCH: 33.1 pg (ref 26.0–34.0)
MCHC: 33.7 g/dL (ref 30.0–36.0)
MCV: 98.4 fL (ref 80.0–100.0)
Monocytes Absolute: 0.2 10*3/uL (ref 0.1–1.0)
Monocytes Relative: 7 %
Neutro Abs: 1.6 10*3/uL — ABNORMAL LOW (ref 1.7–7.7)
Neutrophils Relative %: 68 %
Platelet Count: 80 10*3/uL — ABNORMAL LOW (ref 150–400)
RBC: 3.14 MIL/uL — ABNORMAL LOW (ref 4.22–5.81)
RDW: 16.3 % — ABNORMAL HIGH (ref 11.5–15.5)
WBC Count: 2.4 10*3/uL — ABNORMAL LOW (ref 4.0–10.5)
nRBC: 0 % (ref 0.0–0.2)

## 2023-07-20 LAB — PROTEIN, URINE, RANDOM: Total Protein, Urine: 74 mg/dL

## 2023-07-20 MED ORDER — SODIUM CHLORIDE 0.9 % IV SOLN
5.0000 mg/kg | Freq: Once | INTRAVENOUS | Status: AC
  Administered 2023-07-20: 350 mg via INTRAVENOUS
  Filled 2023-07-20: qty 14

## 2023-07-20 MED ORDER — SODIUM CHLORIDE 0.9 % IV SOLN
Freq: Once | INTRAVENOUS | Status: AC
  Filled 2023-07-20: qty 250

## 2023-07-20 MED ORDER — HEPARIN SOD (PORK) LOCK FLUSH 100 UNIT/ML IV SOLN
500.0000 [IU] | Freq: Once | INTRAVENOUS | Status: AC | PRN
  Administered 2023-07-20: 500 [IU]
  Filled 2023-07-20: qty 5

## 2023-07-20 NOTE — Patient Instructions (Signed)

## 2023-07-20 NOTE — Progress Notes (Signed)
Proceed with treatment with an ANC of 1.6 and platelet count of 80,000 per Dr. Smith Robert.

## 2023-07-21 ENCOUNTER — Other Ambulatory Visit (HOSPITAL_COMMUNITY): Payer: Self-pay

## 2023-07-22 ENCOUNTER — Inpatient Hospital Stay: Payer: Medicare HMO

## 2023-07-22 ENCOUNTER — Encounter: Payer: Self-pay | Admitting: Oncology

## 2023-07-22 DIAGNOSIS — C187 Malignant neoplasm of sigmoid colon: Secondary | ICD-10-CM | POA: Diagnosis not present

## 2023-07-22 DIAGNOSIS — Z5189 Encounter for other specified aftercare: Secondary | ICD-10-CM | POA: Diagnosis not present

## 2023-07-22 DIAGNOSIS — Z5112 Encounter for antineoplastic immunotherapy: Secondary | ICD-10-CM | POA: Diagnosis not present

## 2023-07-22 DIAGNOSIS — C189 Malignant neoplasm of colon, unspecified: Secondary | ICD-10-CM

## 2023-07-22 MED ORDER — PEGFILGRASTIM-CBQV 6 MG/0.6ML ~~LOC~~ SOSY
6.0000 mg | PREFILLED_SYRINGE | Freq: Once | SUBCUTANEOUS | Status: AC
  Administered 2023-07-22: 6 mg via SUBCUTANEOUS
  Filled 2023-07-22: qty 0.6

## 2023-07-25 ENCOUNTER — Other Ambulatory Visit (HOSPITAL_COMMUNITY): Payer: Self-pay

## 2023-07-27 ENCOUNTER — Other Ambulatory Visit (HOSPITAL_COMMUNITY): Payer: Self-pay

## 2023-07-28 ENCOUNTER — Other Ambulatory Visit (HOSPITAL_COMMUNITY): Payer: Self-pay

## 2023-07-28 ENCOUNTER — Other Ambulatory Visit: Payer: Self-pay

## 2023-08-01 ENCOUNTER — Other Ambulatory Visit (HOSPITAL_COMMUNITY): Payer: Self-pay

## 2023-08-03 ENCOUNTER — Telehealth: Payer: Self-pay

## 2023-08-03 ENCOUNTER — Other Ambulatory Visit: Payer: Medicare HMO

## 2023-08-03 ENCOUNTER — Inpatient Hospital Stay: Payer: Medicare HMO

## 2023-08-03 ENCOUNTER — Other Ambulatory Visit: Payer: Self-pay | Admitting: *Deleted

## 2023-08-03 ENCOUNTER — Ambulatory Visit: Payer: Medicare HMO

## 2023-08-03 ENCOUNTER — Other Ambulatory Visit (HOSPITAL_COMMUNITY): Payer: Self-pay

## 2023-08-03 ENCOUNTER — Inpatient Hospital Stay (HOSPITAL_BASED_OUTPATIENT_CLINIC_OR_DEPARTMENT_OTHER): Payer: Medicare HMO | Admitting: Oncology

## 2023-08-03 ENCOUNTER — Encounter: Payer: Self-pay | Admitting: Oncology

## 2023-08-03 VITALS — BP 140/83 | HR 74 | Temp 97.1°F | Resp 19

## 2023-08-03 VITALS — BP 126/86 | HR 76 | Temp 96.5°F | Resp 18 | Ht 67.0 in | Wt 141.6 lb

## 2023-08-03 DIAGNOSIS — Z79899 Other long term (current) drug therapy: Secondary | ICD-10-CM | POA: Diagnosis not present

## 2023-08-03 DIAGNOSIS — Z5112 Encounter for antineoplastic immunotherapy: Secondary | ICD-10-CM

## 2023-08-03 DIAGNOSIS — C189 Malignant neoplasm of colon, unspecified: Secondary | ICD-10-CM | POA: Diagnosis not present

## 2023-08-03 DIAGNOSIS — C772 Secondary and unspecified malignant neoplasm of intra-abdominal lymph nodes: Secondary | ICD-10-CM

## 2023-08-03 DIAGNOSIS — C187 Malignant neoplasm of sigmoid colon: Secondary | ICD-10-CM | POA: Diagnosis not present

## 2023-08-03 DIAGNOSIS — D6959 Other secondary thrombocytopenia: Secondary | ICD-10-CM

## 2023-08-03 DIAGNOSIS — Z5189 Encounter for other specified aftercare: Secondary | ICD-10-CM | POA: Diagnosis not present

## 2023-08-03 DIAGNOSIS — Z7189 Other specified counseling: Secondary | ICD-10-CM

## 2023-08-03 DIAGNOSIS — T50905A Adverse effect of unspecified drugs, medicaments and biological substances, initial encounter: Secondary | ICD-10-CM

## 2023-08-03 LAB — CMP (CANCER CENTER ONLY)
ALT: 12 U/L (ref 0–44)
AST: 21 U/L (ref 15–41)
Albumin: 3.9 g/dL (ref 3.5–5.0)
Alkaline Phosphatase: 96 U/L (ref 38–126)
Anion gap: 7 (ref 5–15)
BUN: 25 mg/dL — ABNORMAL HIGH (ref 8–23)
CO2: 23 mmol/L (ref 22–32)
Calcium: 8.7 mg/dL — ABNORMAL LOW (ref 8.9–10.3)
Chloride: 106 mmol/L (ref 98–111)
Creatinine: 0.9 mg/dL (ref 0.61–1.24)
GFR, Estimated: >60 mL/min (ref >60)
Glucose, Bld: 114 mg/dL — ABNORMAL HIGH (ref 70–99)
Potassium: 3.4 mmol/L — ABNORMAL LOW (ref 3.5–5.1)
Sodium: 136 mmol/L (ref 135–145)
Total Bilirubin: 0.8 mg/dL (ref 0.3–1.2)
Total Protein: 7.4 g/dL (ref 6.5–8.1)

## 2023-08-03 LAB — CBC WITH DIFFERENTIAL (CANCER CENTER ONLY)
Abs Immature Granulocytes: 0.05 10*3/uL (ref 0.00–0.07)
Basophils Absolute: 0 10*3/uL (ref 0.0–0.1)
Basophils Relative: 0 %
Eosinophils Absolute: 0.1 10*3/uL (ref 0.0–0.5)
Eosinophils Relative: 1 %
HCT: 35.9 % — ABNORMAL LOW (ref 39.0–52.0)
Hemoglobin: 12 g/dL — ABNORMAL LOW (ref 13.0–17.0)
Immature Granulocytes: 1 %
Lymphocytes Relative: 9 %
Lymphs Abs: 0.9 10*3/uL (ref 0.7–4.0)
MCH: 33.1 pg (ref 26.0–34.0)
MCHC: 33.4 g/dL (ref 30.0–36.0)
MCV: 99.2 fL (ref 80.0–100.0)
Monocytes Absolute: 0.6 10*3/uL (ref 0.1–1.0)
Monocytes Relative: 6 %
Neutro Abs: 8.6 10*3/uL — ABNORMAL HIGH (ref 1.7–7.7)
Neutrophils Relative %: 83 %
Platelet Count: 84 10*3/uL — ABNORMAL LOW (ref 150–400)
RBC: 3.62 MIL/uL — ABNORMAL LOW (ref 4.22–5.81)
RDW: 16.5 % — ABNORMAL HIGH (ref 11.5–15.5)
WBC Count: 10.3 10*3/uL (ref 4.0–10.5)
nRBC: 0 % (ref 0.0–0.2)

## 2023-08-03 MED ORDER — LACTULOSE 10 GM/15ML PO SOLN: 10.0000 g | Freq: Three times a day (TID) | ORAL | 1 refills | Status: DC | PRN

## 2023-08-03 MED ORDER — SODIUM CHLORIDE 0.9 % IV SOLN
Freq: Once | INTRAVENOUS | Status: AC
  Filled 2023-08-03: qty 250

## 2023-08-03 MED ORDER — SODIUM CHLORIDE 0.9 % IV SOLN
5.0000 mg/kg | Freq: Once | INTRAVENOUS | Status: AC
  Administered 2023-08-03: 350 mg via INTRAVENOUS
  Filled 2023-08-03: qty 14

## 2023-08-03 MED ORDER — HEPARIN SOD (PORK) LOCK FLUSH 100 UNIT/ML IV SOLN
500.0000 [IU] | Freq: Once | INTRAVENOUS | Status: AC | PRN
  Administered 2023-08-03: 500 [IU]
  Filled 2023-08-03: qty 5

## 2023-08-03 NOTE — Progress Notes (Signed)
Hematology/Oncology Consult note [facility]  Telephone:([phone] Fax:([phone]) [phone]  Patient Care Team: [name], MD as PCP - General (Internal Medicine) Benita Gutter, RN as Oncology Nurse Navigator Creig Hines, MD as Consulting Physician (Hematology and Oncology)   Name of the patient: Eric Simon  [id number]  05/22/1951   Date of visit: 08/03/23  Diagnosis-  metastatic colon cancer with lymph node metastases   Chief complaint/ Reason for visit-on treatment assessment prior to cycle 5-day 1 of a Avastin  Heme/Onc history: Patient is a 72 yr old male with >40 pack year history of smoking. He currently smokes 0.5ppd. he presented to the ER with symptoms of left sided chest pain and left arm pain. Troponin was negative ekg was unremarkable. Ct chest showed no PE. He was incidentally noted to have mediastinal and retrocrural adenopathy and a 2.1X2.3X4.4 cm retroaortic soft tissue lesion in the posterior left chest. 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  Low-grade activity in the left lateral fifth and sixth ribs associated with nondisplaced fractures likely reflecting healing response problem malignancy.   Patient underwent CT-guided biopsy of the retroperitoneal lymph node pathology showed metastatic adenocarcinoma compatible with colorectal origin.  CK7 negative.  CK20 positive.  CDX 2+.  TTF-1 negative.  PSA negative.  This pattern of immunoreactivity supports the above diagnosis. Patient underwent colonoscopy which showed sigmoid mass that was consistent with adenocarcinoma.  RAS panel testing showed that he was wild-type for both K-ras and BRAF   FOLFOX and bevacizumab chemotherapy started in July 2020.  Subsequently oxaliplatin has been on hold given neuropathy.  He was switched to irinotecan Xeloda panitumumab regimen in  August 2022.  Scans in October 2023  have shown slow increase in the size of intra-abdominal adenopathy but patient continues to have low-volume disease.  Patient received palliative radiation therapy to the enlarging external iliac lymph nodes.  Repeat lymph node biopsy could not be obtained due to location for foundation 1 testing.  Peripheral blood NGS testing did not show any actionable mutations.  Further disease progression noted in the intra-abdominal lymph nodes on PET scan and patient was switched to Lonsurf Avastin regimen in April 2024    Interval history- Patient has lost 20 pounds over the last 4 to 6 weeks unintentionally.  Although his appetite is good food does not taste good.  Denies any pain presently.  Bowel movements are regular.  ECOG PS- 1 Pain scale- 0   Review of systems- Review of Systems  Constitutional:  Positive for malaise/fatigue and weight loss. Negative for chills and fever.  HENT:  Negative for congestion, ear discharge and nosebleeds.   Eyes:  Negative for blurred vision.  Respiratory:  Negative for cough, hemoptysis, sputum production, shortness of breath and wheezing.   Cardiovascular:  Negative for chest pain, palpitations, orthopnea and claudication.  Gastrointestinal:  Negative for abdominal pain, blood in stool, constipation, diarrhea, heartburn, melena, nausea and vomiting.  Genitourinary:  Negative for dysuria, flank pain, frequency, hematuria and urgency.  Musculoskeletal:  Negative for back pain, joint pain and myalgias.  Skin:  Negative for rash.  Neurological:  Negative for dizziness, tingling, focal weakness, seizures, weakness and headaches.  Endo/Heme/Allergies:  Does not bruise/bleed easily.  Psychiatric/Behavioral:  Negative for depression and suicidal ideas. The patient does not have insomnia.       No Known Allergies   Past  Medical History:  Diagnosis Date   Colon cancer (HCC)    COPD (chronic obstructive pulmonary disease) (HCC)     Hyperlipidemia      Past Surgical History:  Procedure Laterality Date   COLONOSCOPY WITH PROPOFOL N/A 06/26/2019   Procedure: COLONOSCOPY WITH PROPOFOL;  Surgeon: Wyline Mood, MD;  Location: [facility] ENDOSCOPY;  Service: Gastroenterology;  Laterality: N/A;   ESOPHAGOGASTRODUODENOSCOPY (EGD) WITH PROPOFOL N/A 01/12/2023   Procedure: ESOPHAGOGASTRODUODENOSCOPY (EGD) WITH PROPOFOL;  Surgeon: Wyline Mood, MD;  Location: [facility] ENDOSCOPY;  Service: Gastroenterology;  Laterality: N/A;   FLEXIBLE SIGMOIDOSCOPY N/A 05/07/2021   Procedure: FLEXIBLE SIGMOIDOSCOPY;  Surgeon: Wyline Mood, MD;  Location: [facility] ENDOSCOPY;  Service: Gastroenterology;  Laterality: N/A;   PORTA CATH INSERTION N/A 06/25/2019   Procedure: PORTA CATH INSERTION;  Surgeon: Annice Needy, MD;  Location: [facility] INVASIVE CV LAB;  Service: Cardiovascular;  Laterality: N/A;   PORTA CATH INSERTION Left 08/11/2020   Procedure: PORTA CATH INSERTION;  Surgeon: Annice Needy, MD;  Location: [facility] INVASIVE CV LAB;  Service: Cardiovascular;  Laterality: Left;   PORTA CATH REMOVAL Right 08/11/2020   Procedure: PORTA CATH REMOVAL;  Surgeon: Annice Needy, MD;  Location: [facility] INVASIVE CV LAB;  Service: Cardiovascular;  Laterality: Right;    Social History   Socioeconomic History   Marital status: Single    Spouse name: Not on file   Number of children: Not on file   Years of education: Not on file   Highest education level: Not on file  Occupational History   Not on file  Tobacco Use   Smoking status: Every Day    Current packs/day: 0.50    Average packs/day: 0.5 packs/day for 40.0 years (20.0 ttl pk-yrs)    Types: Cigarettes   Smokeless tobacco: Never   Tobacco comments:    smoking 40 years  Vaping Use   Vaping status: Never Used  Substance and Sexual Activity   Alcohol use: No   Drug use: No   Sexual activity: Not Currently  Other Topics Concern   Not on file  Social History Narrative   Not on file   Social Determinants of Health    Financial Resource Strain: Low Risk  (03/02/2023)   Received from [facility], [facility]   Overall Financial Resource Strain (CARDIA)    Difficulty of Paying Living Expenses: Not very hard  Food Insecurity: No Food Insecurity (03/02/2023)   Received from [facility], [facility]   Hunger Vital Sign    Worried About Running Out of Food in the Last Year: Never true    Ran Out of Food in the Last Year: Never true  Transportation Needs: No Transportation Needs (03/02/2023)   Received from [facility], [facility]   [facility] - Transportation    Lack of Transportation (Medical): No    Lack of Transportation (Non-Medical): No  Physical Activity: Not on file  Stress: No Stress Concern Present (06/12/2019)   [employer] of Occupational Health - Occupational Stress Questionnaire    Feeling of Stress : Not at all  Social Connections: Unknown (06/12/2019)   Social Connection and Isolation Panel [NHANES]    Frequency of Communication with Friends and Family: More than three times a week    Frequency of Social Gatherings with Friends and Family: Not on file    Attends Religious Services: Not on file    Active Member of Clubs or Organizations: Not on file    Attends Banker  Meetings: Not on file    Marital Status: Not on file  Intimate Partner Violence: Not At Risk (06/12/2019)   Humiliation, Afraid, Rape, and Kick questionnaire    Fear of Current or Ex-Partner: No    Emotionally Abused: No    Physically Abused: No    Sexually Abused: No    Family History  Problem Relation Age of Onset   Brain cancer Father      Current Outpatient Medications:    aspirin 81 MG chewable tablet, Chew by mouth., Disp: , Rfl:    aspirin EC 81 MG tablet, Take 81 mg by mouth daily., Disp: , Rfl:    azelastine (ASTELIN) 0.1 % nasal spray, Place 1 spray into both nostrils daily as needed., Disp: , Rfl:    budesonide-formoterol (SYMBICORT) 80-4.5 MCG/ACT inhaler, USE 2  INHALATION BY MOUTH TWICE DAILY, Disp: 10.2 g, Rfl: 2   cetirizine (ZYRTEC) 10 MG tablet, TAKE 1 TABLET BY MOUTH DAILY, Disp: 90 tablet, Rfl: 0   cyclobenzaprine (FLEXERIL) 10 MG tablet, TAKE 1 TABLET(10 MG) BY MOUTH THREE TIMES DAILY AS NEEDED FOR MUSCLE SPASMS, Disp: 42 tablet, Rfl: 0   dexlansoprazole (DEXILANT) 60 MG capsule, Take by mouth., Disp: , Rfl:    DULoxetine (CYMBALTA) 30 MG capsule, Take 1 capsule (30 mg total) by mouth 2 (two) times daily., Disp: 60 capsule, Rfl: 1   lidocaine-prilocaine (EMLA) cream, Apply 1 Application topically as needed (pt will put 1 inch of cream over port 1 hourfor each treatment, cover plastic over the cream protect clothes)., Disp: 30 g, Rfl: 3   lisinopril (ZESTRIL) 5 MG tablet, Take 1 tablet (5 mg total) by mouth daily., Disp: 90 tablet, Rfl: 1   montelukast (SINGULAIR) 10 MG tablet, Take 10 mg by mouth at bedtime., Disp: , Rfl:    NEXLETOL 180 MG TABS, Take 1 tablet (180 mg total) by mouth daily., Disp: 90 tablet, Rfl: 1   nicotine (NICODERM CQ - DOSED IN MG/24 HOURS) 21 mg/24hr patch, Place onto the skin., Disp: , Rfl:    pregabalin (LYRICA) 75 MG capsule, TAKE 1 CAPSULE BY MOUTH EVERY MORNING AND TAKE 2 CAPSULES AT BEDTIME, Disp: 90 capsule, Rfl: 1   simvastatin (ZOCOR) 20 MG tablet, Take 1 tablet (20 mg total) by mouth every evening., Disp: 90 tablet, Rfl: 1   tamsulosin (FLOMAX) 0.4 MG CAPS capsule, Take 0.4 mg by mouth. , Disp: , Rfl:    triamcinolone (NASACORT ALLERGY 24HR) 55 MCG/ACT AERO nasal inhaler, Place 2 sprays into the nose daily., Disp: 60 each, Rfl: 2   trifluridine-tipiracil (LONSURF) 20-8.19 MG tablet, Take 2 tablets (40 mg of trifluridine total) by mouth 2 (two) times daily. 1hr after AM & PM meals days 1-5, 8-12. Repeat every 28d., Disp: 40 tablet, Rfl: 3   dexlansoprazole (DEXILANT) 60 MG capsule, Take 1 capsule (60 mg total) by mouth every morning., Disp: 90 capsule, Rfl: 1 No current facility-administered medications for this  visit.  Facility-Administered Medications Ordered in Other Visits:    atropine 1 MG/ML injection, , , ,    heparin lock flush 100 UNIT/ML injection, , , ,   Physical exam:  Vitals:   08/03/23 0823  BP: 126/86  Pulse: 76  Resp: 18  Temp: (!) 96.5 F (35.8 C)  TempSrc: Tympanic  SpO2: 100%  Weight: 141 lb 9.6 oz (64.2 kg)  Height: 5\' 7"  (1.702 m)   Physical Exam Cardiovascular:     Rate and Rhythm: Normal rate and regular rhythm.  Heart sounds: Normal heart sounds.  Pulmonary:     Effort: Pulmonary effort is normal.     Breath sounds: Normal breath sounds.  Abdominal:     General: Bowel sounds are normal.     Palpations: Abdomen is soft.  Skin:    General: Skin is warm and dry.  Neurological:     Mental Status: He is alert and oriented to person, place, and time.         Latest Ref Rng & Units 08/03/2023    8:09 AM  CMP  Glucose 70 - 99 mg/dL 119   BUN 8 - 23 mg/dL 25   Creatinine 1.47 - 1.24 mg/dL 8.29   Sodium 562 - 130 mmol/L 136   Potassium 3.5 - 5.1 mmol/L 3.4   Chloride 98 - 111 mmol/L 106   CO2 22 - 32 mmol/L 23   Calcium 8.9 - 10.3 mg/dL 8.7   Total Protein 6.5 - 8.1 g/dL 7.4   Total Bilirubin 0.3 - 1.2 mg/dL 0.8   Alkaline Phos 38 - 126 U/L 96   AST 15 - 41 U/L 21   ALT 0 - 44 U/L 12       Latest Ref Rng & Units 08/03/2023    8:09 AM  CBC  WBC 4.0 - 10.5 K/uL 10.3   Hemoglobin 13.0 - 17.0 g/dL 86.5   Hematocrit 78.4 - 52.0 % 35.9   Platelets 150 - 400 K/uL 84     No images are attached to the encounter.  NM PET Image Restag (PS) Skull Base To Thigh  Result Date: 07/20/2023 CLINICAL DATA:  Subsequent treatment strategy for colon cancer. EXAM: NUCLEAR MEDICINE PET SKULL BASE TO THIGH TECHNIQUE: 7.4 mCi F-18 FDG was injected intravenously. Full-ring PET imaging was performed from the skull base to thigh after the radiotracer. CT data was obtained and used for attenuation correction and anatomic localization. Fasting blood glucose: 87 mg/dl  COMPARISON:  69/62/9528. FINDINGS: Mediastinal blood pool activity: SUV max 2.1 Liver activity: SUV max NA NECK: No abnormal hypermetabolism. Incidental CT findings: None. CHEST: Ill-defined nodular consolidation in the medial aspect of the superior segment right lower lobe (4/57) measures approximately 10 mm (4/57), SUV max 3.4, stable. Incidental CT findings: Left IJ Port-A-Cath terminates in the high right atrium. Atherosclerotic calcification of the aorta and coronary arteries. Heart is at the upper limits of normal in size to mildly enlarged. No pericardial effusion. Centrilobular and paraseptal emphysema. Mild dependent atelectasis bilaterally. ABDOMEN/PELVIS: There are new hypermetabolic lesions the right hepatic lobe. Index 11 mm low-attenuation lesion in the medial right hepatic lobe (4/80), SUV max 8.7. A single faint hypermetabolic nodule in the dome of the left hepatic lobe, SUV max 3.5, poorly visualized on CT. Slightly increased hypermetabolism associated with hypermetabolic retroperitoneal and mesenteric adenopathy, as before. Index aortocaval lymph node measures 1.4 cm (4/95), SUV max 13.6, compared to 2.3 cm and 17.4 previously. Increased focal hypermetabolism associated with the sigmoid colon, SUV max 16.5, compared to 12.8 previously. New foci of perirectal hypermetabolism appear to correspond to vessels rather than lymph nodes. Index retroperitoneal lymph node along the left common iliac vein measures 3 mm (4/124), SUV max 3.3, compared to 10 mm and 15.9 previously. Incidental CT findings: Liver, gallbladder, adrenal glands, kidneys, spleen, pancreas, stomach and bowel are otherwise grossly unremarkable. Atherosclerotic calcification of the aorta. Tiny umbilical hernia contains fat. No pelvic free fluid. SKELETON: No abnormal hypermetabolism. Incidental CT findings: None. IMPRESSION: 1. Mixed response to therapy as evidenced  by decrease in size and hypermetabolism of mesenteric and abdominopelvic  retroperitoneal nodes but new hypermetabolic hepatic metastases. 2. Hypermetabolic nodular consolidation in the superior segment right lower lobe, similar. Recommend attention on follow-up as a metastasis cannot be excluded. 3. Increasing focal hypermetabolism in the sigmoid colon, indicative of disease recurrence, as before. 4. Aortic atherosclerosis (ICD10-I70.0). Coronary artery calcification. 5.  Emphysema (ICD10-J43.9). Electronically Signed   By: Leanna Battles M.D.   On: 07/20/2023 14:16     Assessment and plan- Patient is a 72 y.o. male with metastatic colorectal adenocarcinoma here for on treatment assessment prior to cycle 5-day 1 of a Avastin and Lonsurf  Counts okay to proceed with cycle 5 of a Avastin today.  He also starts his Lonsurf today.  Platelets are low but more than 75.  He is receiving a lowerOf Lonsurf 20/8.19 mg tablet 2 tablets twice a day.  I have reviewed PET CT scan images independently and discussed findings with the patientWhich shows mixed response to treatment.  He has new liver metastases although there is some improvement in mesenteric and retroperitoneal adenopathy.  There was also increased hypermetabolism noted in the sigmoid colon.  This along with his unintentional weight loss is concerning for disease progression.  Patient had last NGS testing done from peripheral blood as we did not have any significant tissue for biopsy at that time given his low-volume disease.  Peripheral blood NGS testing from February 2022 did not show any evidence of actionable mutations but ct DNA levels were low on the test..  I have discussed this case with Dr. Juliette Alcide from interventional radiology and he is agreeable to attempt liver biopsy versus retroperitoneal lymph node biopsy which will help [place] with tissue NGS testing to see if there are any actionable mutations.  Outside of clinical trial I will need to consider fourth line fruquintinib for him.  We will work on Therapist, occupational  for the same.  I have reached out to Dr. Timoteo Gaul from [facility] who he has seen in the past and [facility] does not have any clinical trials for him.  I will also reach out to [facility] to see if they have any clinical trials after having failed multiple lines of chemotherapy.  Patient understands that his overall prognosis is poor likely less than 6 months  I will see him back in 2 weeks time for cycle 5-day 15 of a Avastin and discuss liver biopsy results and further management     Visit Diagnosis 1. 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)   2. Encounter for monoclonal antibody treatment for malignancy   3. High risk medication use   4. Goals of care, counseling/discussion   5. Drug-induced thrombocytopenia      Dr. Owens Shark, MD, MPH [facility] [phone] 08/03/2023 11:29 AM

## 2023-08-03 NOTE — Patient Instructions (Signed)
[place] [facility]  Discharge Instructions: Thank you for choosing [facility] to provide your oncology and hematology care.  If you have a lab appointment with the Cancer Center, please go directly to the Cancer Center and check in at the registration area.  Wear comfortable clothing and clothing appropriate for easy access to any Portacath or PICC line.   We strive to give you quality time with your provider. You may need to reschedule your appointment if you arrive late (15 or more minutes).  Arriving late affects you and other patients whose appointments are after yours.  Also, if you miss three or more appointments without notifying the office, you may be dismissed from the clinic at the provider's discretion.      For prescription refill requests, have your pharmacy contact our office and allow 72 hours for refills to be completed.    Today you received the following chemotherapy and/or immunotherapy agents mvasi      To help prevent nausea and vomiting after your treatment, we encourage you to take your nausea medication as directed.  BELOW ARE SYMPTOMS THAT SHOULD BE REPORTED IMMEDIATELY: *FEVER GREATER THAN 100.4 F (38 C) OR HIGHER *CHILLS OR SWEATING *NAUSEA AND VOMITING THAT IS NOT CONTROLLED WITH YOUR NAUSEA MEDICATION *UNUSUAL SHORTNESS OF BREATH *UNUSUAL BRUISING OR BLEEDING *URINARY PROBLEMS (pain or burning when urinating, or frequent urination) *BOWEL PROBLEMS (unusual diarrhea, constipation, pain near the anus) TENDERNESS IN MOUTH AND THROAT WITH OR WITHOUT PRESENCE OF ULCERS (sore throat, sores in mouth, or a toothache) UNUSUAL RASH, SWELLING OR PAIN  UNUSUAL VAGINAL DISCHARGE OR ITCHING   Items with * indicate a potential emergency and should be followed up as soon as possible or go to the Emergency Department if any problems should occur.  Please show the CHEMOTHERAPY ALERT CARD or IMMUNOTHERAPY ALERT CARD at check-in to the  Emergency Department and triage nurse.  Should you have questions after your visit or need to cancel or reschedule your appointment, please contact [place] [facility]  [phone] and follow the prompts.  Office hours are 8:00 a.m. to 4:30 p.m. Monday - Friday. Please note that voicemails left after 4:00 p.m. may not be returned until the following business day.  We are closed weekends and major holidays. You have access to a nurse at all times for urgent questions. Please call the main number to the clinic [phone] and follow the prompts.  For any non-urgent questions, you may also contact your provider using MyChart. We now offer e-Visits for anyone 75 and older to request care online for non-urgent symptoms. For details visit [email or web address].   Also download the MyChart app! Go to the app store, search "MyChart", open the app, select [place], and log in with your MyChart username and password.

## 2023-08-03 NOTE — Telephone Encounter (Signed)
Oral Oncology Patient Advocate Encounter  Prior Authorization for Eric Simon has been approved.    PA# [id number]  Effective dates: 12/13/22 through 12/13/23  Patients co-pay is $0.00.    Eric Simon, CPhT Oncology Pharmacy Patient Advocate  [facility]  [phone] (phone) [phone] (fax) 08/03/2023 12:48 PM

## 2023-08-03 NOTE — Addendum Note (Signed)
Addended by: Corene Cornea on: 08/03/2023 11:51 AM   Modules accepted: Orders

## 2023-08-03 NOTE — Telephone Encounter (Signed)
Oral Oncology Patient Advocate Encounter  New authorization   Received notification that prior authorization for Eric Simon is required.   PA submitted on 08/03/23  Key [id number]  Status is pending     Ardeen Fillers, CPhT Oncology Pharmacy Patient Advocate  [facility]  [phone] (phone) [phone] (fax) 08/03/2023 12:07 PM

## 2023-08-04 LAB — CEA: CEA: 14.2 ng/mL — ABNORMAL HIGH (ref 0.0–4.7)

## 2023-08-05 ENCOUNTER — Telehealth: Payer: Self-pay | Admitting: Pharmacist

## 2023-08-05 ENCOUNTER — Ambulatory Visit: Payer: Medicare HMO

## 2023-08-05 ENCOUNTER — Encounter: Payer: Self-pay | Admitting: Internal Medicine

## 2023-08-05 ENCOUNTER — Ambulatory Visit: Payer: Medicare HMO | Admitting: Internal Medicine

## 2023-08-05 VITALS — BP 110/70 | HR 81 | Ht 67.0 in | Wt 141.0 lb

## 2023-08-05 DIAGNOSIS — Z720 Tobacco use: Secondary | ICD-10-CM

## 2023-08-05 DIAGNOSIS — C189 Malignant neoplasm of colon, unspecified: Secondary | ICD-10-CM | POA: Diagnosis not present

## 2023-08-05 DIAGNOSIS — G44029 Chronic cluster headache, not intractable: Secondary | ICD-10-CM | POA: Diagnosis not present

## 2023-08-05 DIAGNOSIS — Z1331 Encounter for screening for depression: Secondary | ICD-10-CM

## 2023-08-05 DIAGNOSIS — K219 Gastro-esophageal reflux disease without esophagitis: Secondary | ICD-10-CM | POA: Diagnosis not present

## 2023-08-05 DIAGNOSIS — C772 Secondary and unspecified malignant neoplasm of intra-abdominal lymph nodes: Secondary | ICD-10-CM

## 2023-08-05 DIAGNOSIS — E782 Mixed hyperlipidemia: Secondary | ICD-10-CM | POA: Diagnosis not present

## 2023-08-05 DIAGNOSIS — M545 Low back pain, unspecified: Secondary | ICD-10-CM | POA: Diagnosis not present

## 2023-08-05 DIAGNOSIS — J301 Allergic rhinitis due to pollen: Secondary | ICD-10-CM | POA: Diagnosis not present

## 2023-08-05 DIAGNOSIS — I10 Essential (primary) hypertension: Secondary | ICD-10-CM | POA: Diagnosis not present

## 2023-08-05 DIAGNOSIS — F17209 Nicotine dependence, unspecified, with unspecified nicotine-induced disorders: Secondary | ICD-10-CM | POA: Diagnosis not present

## 2023-08-05 DIAGNOSIS — M79605 Pain in left leg: Secondary | ICD-10-CM | POA: Insufficient documentation

## 2023-08-05 DIAGNOSIS — J410 Simple chronic bronchitis: Secondary | ICD-10-CM

## 2023-08-05 MED ORDER — TRAMADOL-ACETAMINOPHEN 37.5-325 MG PO TABS
1.0000 | ORAL_TABLET | Freq: Four times a day (QID) | ORAL | 0 refills | Status: AC | PRN
Start: 2023-08-05 — End: 2023-08-10

## 2023-08-05 MED ORDER — BUDESONIDE-FORMOTEROL FUMARATE 80-4.5 MCG/ACT IN AERO: INHALATION_SPRAY | RESPIRATORY_TRACT | 2 refills | Status: DC

## 2023-08-05 MED ORDER — DEXLANSOPRAZOLE 60 MG PO CPDR
1.0000 | DELAYED_RELEASE_CAPSULE | Freq: Every morning | ORAL | 1 refills | Status: DC
Start: 2023-08-05 — End: 2023-11-28

## 2023-08-05 NOTE — Telephone Encounter (Signed)
Clinical Pharmacist Practitioner Encounter   Received new prescription for Fruzaqla (fruquintinib) for the treatment of metastatic colon cancer, planned duration until disease progression or unacceptable drug toxicity.  CMP from 08/03/23 assessed, no relevant lab abnormalities. Prescription dose and frequency assessed.   Current medication list in Epic reviewed, no DDIs with fruquintinib identified.   Evaluated chart and no patient barriers to medication adherence identified.   Prescription has been e-scribed to the [facility] for benefits analysis and approval.  Oral Oncology Clinic will continue to follow for insurance authorization, copayment issues, initial counseling and start date.   Remi Haggard, PharmD, BCPS, BCOP, CPP Hematology/Oncology Clinical Pharmacist Practitioner [place] [phone]  08/05/2023 3:25 PM

## 2023-08-05 NOTE — Progress Notes (Signed)
Opened in error

## 2023-08-05 NOTE — Progress Notes (Signed)
Established Patient Office Visit  Subjective:  Patient ID: Eric Simon, male    DOB: [birth year]  Age: 72 y.o. MRN: [id number]  Chief Complaint  Patient presents with   Medicare Wellness    3 mo AWV with lab results    No new complaints, here for AWV refer to quality metrics and scanned documents.  Lost weight and cancer has metastasized to his liver.    No other concerns at this time.   Past Medical History:  Diagnosis Date   Colon cancer (HCC)    COPD (chronic obstructive pulmonary disease) (HCC)    Hyperlipidemia     Past Surgical History:  Procedure Laterality Date   COLONOSCOPY WITH PROPOFOL N/A 06/26/2019   Procedure: COLONOSCOPY WITH PROPOFOL;  Surgeon: Wyline Mood, MD;  Location: [facility] ENDOSCOPY;  Service: Gastroenterology;  Laterality: N/A;   ESOPHAGOGASTRODUODENOSCOPY (EGD) WITH PROPOFOL N/A 01/12/2023   Procedure: ESOPHAGOGASTRODUODENOSCOPY (EGD) WITH PROPOFOL;  Surgeon: Wyline Mood, MD;  Location: [facility] ENDOSCOPY;  Service: Gastroenterology;  Laterality: N/A;   FLEXIBLE SIGMOIDOSCOPY N/A 05/07/2021   Procedure: FLEXIBLE SIGMOIDOSCOPY;  Surgeon: Wyline Mood, MD;  Location: [facility] ENDOSCOPY;  Service: Gastroenterology;  Laterality: N/A;   PORTA CATH INSERTION N/A 06/25/2019   Procedure: PORTA CATH INSERTION;  Surgeon: Annice Needy, MD;  Location: [facility] INVASIVE CV LAB;  Service: Cardiovascular;  Laterality: N/A;   PORTA CATH INSERTION Left 08/11/2020   Procedure: PORTA CATH INSERTION;  Surgeon: Annice Needy, MD;  Location: [facility] INVASIVE CV LAB;  Service: Cardiovascular;  Laterality: Left;   PORTA CATH REMOVAL Right 08/11/2020   Procedure: PORTA CATH REMOVAL;  Surgeon: Annice Needy, MD;  Location: [facility] INVASIVE CV LAB;  Service: Cardiovascular;  Laterality: Right;    Social History   Socioeconomic History   Marital status: Single    Spouse name: Not on file   Number of children: Not on file   Years of education: Not on file   Highest education level: Not on file   Occupational History   Not on file  Tobacco Use   Smoking status: Every Day    Current packs/day: 0.50    Average packs/day: 0.5 packs/day for 40.0 years (20.0 ttl pk-yrs)    Types: Cigarettes   Smokeless tobacco: Never   Tobacco comments:    smoking 40 years  Vaping Use   Vaping status: Never Used  Substance and Sexual Activity   Alcohol use: No   Drug use: No   Sexual activity: Not Currently  Other Topics Concern   Not on file  Social History Narrative   Not on file   Social Determinants of Health   Financial Resource Strain: Low Risk  (03/02/2023)   Received from [facility], [facility]   Overall Financial Resource Strain (CARDIA)    Difficulty of Paying Living Expenses: Not very hard  Food Insecurity: No Food Insecurity (03/02/2023)   [facility] from [facility], [facility]   Hunger Vital Sign    Worried About Running Out of Food in the Last Year: Never true    Ran Out of Food in the Last Year: Never true  Transportation Needs: No Transportation Needs (03/02/2023)   Received from [facility], [facility]   [facility] - Transportation    Lack of Transportation (Medical): No    Lack of Transportation (Non-Medical): No  Physical Activity: Not on file  Stress: No Stress Concern Present (06/12/2019)   [employer] of Occupational Health - Occupational Stress Questionnaire  Feeling of Stress : Not at all  Social Connections: Unknown (06/12/2019)   Social Connection and Isolation Panel [NHANES]    Frequency of Communication with Friends and Family: More than three times a week    Frequency of Social Gatherings with Friends and Family: Not on file    Attends Religious Services: Not on file    Active Member of Clubs or Organizations: Not on file    Attends Banker Meetings: Not on file    Marital Status: Not on file  Intimate Partner Violence: Not At Risk (06/12/2019)   Humiliation, Afraid, Rape, and Kick questionnaire    Fear of  Current or Ex-Partner: No    Emotionally Abused: No    Physically Abused: No    Sexually Abused: No    Family History  Problem Relation Age of Onset   Brain cancer Father     No Known Allergies  Review of Systems  Constitutional:  Positive for weight loss.  HENT: Negative.    Eyes: Negative.   Respiratory: Negative.    Cardiovascular: Negative.   Gastrointestinal: Negative.   Genitourinary: Negative.   Skin: Negative.   Neurological: Negative.   Endo/Heme/Allergies: Negative.        Objective:   BP 110/70   Pulse 81   Ht 5\' 7"  (1.702 m)   Wt 141 lb (64 kg)   SpO2 95%   BMI 22.08 kg/m   Vitals:   08/05/23 1025  BP: 110/70  Pulse: 81  Height: 5\' 7"  (1.702 m)  Weight: 141 lb (64 kg)  SpO2: 95%  BMI (Calculated): 22.08    Physical Exam Vitals reviewed.  Constitutional:      Appearance: Normal appearance.  HENT:     Head: Normocephalic.     Left Ear: There is no impacted cerumen.     Nose: Nose normal.     Mouth/Throat:     Mouth: Mucous membranes are moist.     Pharynx: No posterior oropharyngeal erythema.  Eyes:     Comments: R false eye  Cardiovascular:     Rate and Rhythm: Regular rhythm.     Chest Wall: PMI is not displaced.     Pulses: Normal pulses.     Heart sounds: Normal heart sounds. No murmur heard. Pulmonary:     Effort: Pulmonary effort is normal.     Breath sounds: Normal air entry. No rhonchi or rales.  Abdominal:     General: Abdomen is flat. Bowel sounds are normal. There is no distension.     Palpations: Abdomen is soft. There is no hepatomegaly, splenomegaly or mass.     Tenderness: There is no abdominal tenderness.  Musculoskeletal:        General: Normal range of motion.     Cervical back: Normal range of motion and neck supple.     Right lower leg: No edema.     Left lower leg: No edema.  Skin:    General: Skin is warm and dry.  Neurological:     General: No focal deficit present.     Mental Status: He is alert and  oriented to person, place, and time.     Cranial Nerves: No cranial nerve deficit.     Motor: No weakness.  Psychiatric:        Mood and Affect: Mood normal.        Behavior: Behavior normal.      No results found for any visits on 08/05/23.  Recent Results (from  the past 2160 hour(Ladarrion Telfair))  CBC with Differential (Cancer Center Only)     Status: Abnormal   Collection Time: 05/18/23  8:19 AM  Result Value Ref Range   WBC Count 1.4 (L) 4.0 - 10.5 K/uL   RBC 3.17 (L) 4.22 - 5.81 MIL/uL   Hemoglobin 10.1 (L) 13.0 - 17.0 g/dL   HCT 10.2 (L) 72.5 - 36.6 %   MCV 94.6 80.0 - 100.0 fL   MCH 31.9 26.0 - 34.0 pg   MCHC 33.7 30.0 - 36.0 g/dL   RDW 44.0 (H) 34.7 - 42.5 %   Platelet Count 85 (L) 150 - 400 K/uL   nRBC 0.0 0.0 - 0.2 %   Neutrophils Relative % 35 %   Neutro Abs 0.5 (L) 1.7 - 7.7 K/uL   Lymphocytes Relative 46 %   Lymphs Abs 0.6 (L) 0.7 - 4.0 K/uL   Monocytes Relative 16 %   Monocytes Absolute 0.2 0.1 - 1.0 K/uL   Eosinophils Relative 1 %   Eosinophils Absolute 0.0 0.0 - 0.5 K/uL   Basophils Relative 1 %   Basophils Absolute 0.0 0.0 - 0.1 K/uL   Immature Granulocytes 1 %   Abs Immature Granulocytes 0.01 0.00 - 0.07 K/uL    Comment: Performed at [facility], [address] [place], [place] [address]  CEA     Status: Abnormal   Collection Time: 05/18/23  8:19 AM  Result Value Ref Range   CEA 6.6 (H) 0.0 - 4.7 ng/mL    Comment: (NOTE)                             Nonsmokers          <3.9                             Smokers             <5.6 Roche [employer]ostics Electrochemiluminescence Immunoassay (ECLIA) Values obtained with different assay methods or kits cannot be used interchangeably.  Results cannot be interpreted as absolute evidence of the presence or absence of malignant disease. Performed At: [facility] [place] [address] [place], [place] [id number] Jolene Schimke MD JO:[id number]   CMP (Cancer Center only)     Status: Abnormal   Collection Time:  05/18/23  8:19 AM  Result Value Ref Range   Sodium 140 135 - 145 mmol/L   Potassium 3.7 3.5 - 5.1 mmol/L   Chloride 109 98 - 111 mmol/L   CO2 23 22 - 32 mmol/L   Glucose, Bld 94 70 - 99 mg/dL    Comment: Glucose reference range applies only to samples taken after fasting for at least 8 hours.   BUN 25 (H) 8 - 23 mg/dL   Creatinine 6.01 0.93 - 1.24 mg/dL   Calcium 8.3 (L) 8.9 - 10.3 mg/dL   Total Protein 6.7 6.5 - 8.1 g/dL   Albumin 3.6 3.5 - 5.0 g/dL   AST 21 15 - 41 U/L   ALT 12 0 - 44 U/L   Alkaline Phosphatase 83 38 - 126 U/L   Total Bilirubin 0.6 0.3 - 1.2 mg/dL   GFR, Estimated >23 >55 mL/min    Comment: (NOTE) Calculated using the CKD-EPI Creatinine Equation (2021)    Anion gap 8 5 - 15    Comment: Performed at [facility], [address], [place], [place] [address]  Protein, urine, random     Status: None   Collection Time: 05/18/23  8:30 AM  Result Value Ref Range   Total Protein, Urine 40 mg/dL    Comment: NO NORMAL RANGE ESTABLISHED FOR THIS TEST Performed at [facility], [address] [place], [place] [address]   CBC with Differential (Cancer Center Only)     Status: Abnormal   Collection Time: 05/25/23  8:43 AM  Result Value Ref Range   WBC Count 2.8 (L) 4.0 - 10.5 K/uL   RBC 3.37 (L) 4.22 - 5.81 MIL/uL   Hemoglobin 10.7 (L) 13.0 - 17.0 g/dL   HCT 60.4 (L) 54.0 - 98.1 %   MCV 93.8 80.0 - 100.0 fL   MCH 31.8 26.0 - 34.0 pg   MCHC 33.9 30.0 - 36.0 g/dL   RDW 19.1 (H) 47.8 - 29.5 %   Platelet Count 81 (L) 150 - 400 K/uL   nRBC 0.0 0.0 - 0.2 %   Neutrophils Relative % 57 %   Neutro Abs 1.6 (L) 1.7 - 7.7 K/uL   Lymphocytes Relative 29 %   Lymphs Abs 0.8 0.7 - 4.0 K/uL   Monocytes Relative 12 %   Monocytes Absolute 0.3 0.1 - 1.0 K/uL   Eosinophils Relative 1 %   Eosinophils Absolute 0.0 0.0 - 0.5 K/uL   Basophils Relative 0 %   Basophils Absolute 0.0 0.0 - 0.1 K/uL   Immature Granulocytes 1 %   Abs Immature Granulocytes 0.02 0.00 - 0.07  K/uL    Comment: Performed at [facility], [address] [place], [place] [address]  Protein, urine, random     Status: None   Collection Time: 05/25/23  9:03 AM  Result Value Ref Range   Total Protein, Urine 30 mg/dL    Comment: NO NORMAL RANGE ESTABLISHED FOR THIS TEST Performed at [facility], [address] [place], [place] [address]   CBC with Differential (Cancer Center Only)     Status: Abnormal   Collection Time: 06/08/23  8:27 AM  Result Value Ref Range   WBC Count 2.6 (L) 4.0 - 10.5 K/uL   RBC 3.09 (L) 4.22 - 5.81 MIL/uL   Hemoglobin 9.9 (L) 13.0 - 17.0 g/dL   HCT 46.9 (L) 62.9 - 52.8 %   MCV 93.2 80.0 - 100.0 fL   MCH 32.0 26.0 - 34.0 pg   MCHC 34.4 30.0 - 36.0 g/dL   RDW 41.3 (H) 24.4 - 01.0 %   Platelet Count 94 (L) 150 - 400 K/uL   nRBC 0.0 0.0 - 0.2 %   Neutrophils Relative % 58 %   Neutro Abs 1.5 (L) 1.7 - 7.7 K/uL   Lymphocytes Relative 30 %   Lymphs Abs 0.8 0.7 - 4.0 K/uL   Monocytes Relative 8 %   Monocytes Absolute 0.2 0.1 - 1.0 K/uL   Eosinophils Relative 3 %   Eosinophils Absolute 0.1 0.0 - 0.5 K/uL   Basophils Relative 0 %   Basophils Absolute 0.0 0.0 - 0.1 K/uL   Immature Granulocytes 1 %   Abs Immature Granulocytes 0.02 0.00 - 0.07 K/uL    Comment: Performed at [facility], [address] [place], [place] [address]  CMP (Cancer Center only)     Status: Abnormal   Collection Time: 06/22/23  8:28 AM  Result Value Ref Range   Sodium 140 135 - 145 mmol/L   Potassium 3.7 3.5 - 5.1 mmol/L   Chloride 111 98 -  111 mmol/L   CO2 22 22 - 32 mmol/L   Glucose, Bld 115 (H) 70 - 99 mg/dL    Comment: Glucose reference range applies only to samples taken after fasting for at least 8 hours.   BUN 22 8 - 23 mg/dL   Creatinine 1.61 0.96 - 1.24 mg/dL   Calcium 8.3 (L) 8.9 - 10.3 mg/dL   Total Protein 6.8 6.5 - 8.1 g/dL   Albumin 3.7 3.5 - 5.0 g/dL   AST 20 15 - 41 U/L   ALT 9 0 - 44 U/L   Alkaline Phosphatase 84 38 - 126 U/L    Total Bilirubin 0.6 0.3 - 1.2 mg/dL   GFR, Estimated >04 >54 mL/min    Comment: (NOTE) Calculated using the CKD-EPI Creatinine Equation (2021)    Anion gap 7 5 - 15    Comment: Performed at [facility], [address] [place], [place] [address]  CBC with Differential (Cancer Center Only)     Status: Abnormal   Collection Time: 06/22/23  8:28 AM  Result Value Ref Range   WBC Count 3.1 (L) 4.0 - 10.5 K/uL   RBC 2.73 (L) 4.22 - 5.81 MIL/uL   Hemoglobin 8.9 (L) 13.0 - 17.0 g/dL   HCT 91.4 (L) 78.2 - 95.6 %   MCV 100.0 80.0 - 100.0 fL   MCH 32.6 26.0 - 34.0 pg   MCHC 32.6 30.0 - 36.0 g/dL   RDW 21.3 (H) 08.6 - 57.8 %   Platelet Count 55 (L) 150 - 400 K/uL    Comment: SPECIMEN CHECKED FOR CLOTS   nRBC 0.0 0.0 - 0.2 %   Neutrophils Relative % 72 %   Neutro Abs 2.3 1.7 - 7.7 K/uL   Lymphocytes Relative 17 %   Lymphs Abs 0.5 (L) 0.7 - 4.0 K/uL   Monocytes Relative 9 %   Monocytes Absolute 0.3 0.1 - 1.0 K/uL   Eosinophils Relative 1 %   Eosinophils Absolute 0.0 0.0 - 0.5 K/uL   Basophils Relative 0 %   Basophils Absolute 0.0 0.0 - 0.1 K/uL   Immature Granulocytes 1 %   Abs Immature Granulocytes 0.02 0.00 - 0.07 K/uL    Comment: Performed at [facility], [address] [place], [place] [address]  CBC with Differential (Cancer Center Only)     Status: Abnormal   Collection Time: 07/06/23  8:05 AM  Result Value Ref Range   WBC Count 4.8 4.0 - 10.5 K/uL   RBC 3.24 (L) 4.22 - 5.81 MIL/uL   Hemoglobin 10.8 (L) 13.0 - 17.0 g/dL   HCT 95.2 (L) 84.1 - 32.4 %   MCV 100.6 (H) 80.0 - 100.0 fL   MCH 33.3 26.0 - 34.0 pg   MCHC 33.1 30.0 - 36.0 g/dL   RDW 40.1 (H) 02.7 - 25.3 %   Platelet Count 107 (L) 150 - 400 K/uL   nRBC 0.0 0.0 - 0.2 %   Neutrophils Relative % 70 %   Neutro Abs 3.4 1.7 - 7.7 K/uL   Lymphocytes Relative 17 %   Lymphs Abs 0.8 0.7 - 4.0 K/uL   Monocytes Relative 11 %   Monocytes Absolute 0.5 0.1 - 1.0 K/uL   Eosinophils Relative 1 %   Eosinophils  Absolute 0.0 0.0 - 0.5 K/uL   Basophils Relative 1 %   Basophils Absolute 0.0 0.0 - 0.1 K/uL   Immature Granulocytes 0 %   Abs Immature Granulocytes 0.02 0.00 - 0.07 K/uL  Comment: Performed at [facility], [address] [place], [place] [address]  CMP (Cancer Center only)     Status: Abnormal   Collection Time: 07/06/23  8:05 AM  Result Value Ref Range   Sodium 137 135 - 145 mmol/L   Potassium 3.7 3.5 - 5.1 mmol/L   Chloride 108 98 - 111 mmol/L   CO2 23 22 - 32 mmol/L   Glucose, Bld 101 (H) 70 - 99 mg/dL    Comment: Glucose reference range applies only to samples taken after fasting for at least 8 hours.   BUN 23 8 - 23 mg/dL   Creatinine 6.04 5.40 - 1.24 mg/dL   Calcium 8.5 (L) 8.9 - 10.3 mg/dL   Total Protein 7.2 6.5 - 8.1 g/dL   Albumin 3.8 3.5 - 5.0 g/dL   AST 23 15 - 41 U/L   ALT 12 0 - 44 U/L   Alkaline Phosphatase 80 38 - 126 U/L   Total Bilirubin 0.6 0.3 - 1.2 mg/dL   GFR, Estimated >98 >11 mL/min    Comment: (NOTE) Calculated using the CKD-EPI Creatinine Equation (2021)    Anion gap 6 5 - 15    Comment: Performed at [facility], [address] [place], [place] [address]  Protein, urine, random     Status: None   Collection Time: 07/06/23  8:05 AM  Result Value Ref Range   Total Protein, Urine 78 mg/dL    Comment: NO NORMAL RANGE ESTABLISHED FOR THIS TEST Performed at [facility], [address] [place], [place] [address]   Glucose, capillary     Status: None   Collection Time: 07/14/23  1:05 PM  Result Value Ref Range   Glucose-Capillary 87 70 - 99 mg/dL    Comment: Glucose reference range applies only to samples taken after fasting for at least 8 hours.  CBC with Differential (Cancer Center Only)     Status: Abnormal   Collection Time: 07/20/23  8:32 AM  Result Value Ref Range   WBC Count 2.4 (L) 4.0 - 10.5 K/uL   RBC 3.14 (L) 4.22 - 5.81 MIL/uL   Hemoglobin 10.4 (L) 13.0 - 17.0 g/dL   HCT 13.0 (L) 86.5 - 78.4 %   MCV 98.4  80.0 - 100.0 fL   MCH 33.1 26.0 - 34.0 pg   MCHC 33.7 30.0 - 36.0 g/dL   RDW 69.6 (H) 29.5 - 28.4 %   Platelet Count 80 (L) 150 - 400 K/uL    Comment: SPECIMEN CHECKED FOR CLOTS   nRBC 0.0 0.0 - 0.2 %   Neutrophils Relative % 68 %   Neutro Abs 1.6 (L) 1.7 - 7.7 K/uL   Lymphocytes Relative 22 %   Lymphs Abs 0.5 (L) 0.7 - 4.0 K/uL   Monocytes Relative 7 %   Monocytes Absolute 0.2 0.1 - 1.0 K/uL   Eosinophils Relative 3 %   Eosinophils Absolute 0.1 0.0 - 0.5 K/uL   Basophils Relative 0 %   Basophils Absolute 0.0 0.0 - 0.1 K/uL   Immature Granulocytes 0 %   Abs Immature Granulocytes 0.01 0.00 - 0.07 K/uL    Comment: Performed at [facility], [address] [place], [place] [address]  Protein, urine, random     Status: None   Collection Time: 07/20/23  8:32 AM  Result Value Ref Range   Total Protein, Urine 74 mg/dL    Comment: NO NORMAL RANGE ESTABLISHED FOR THIS TEST Performed at [facility],  [address], [place], [place] [address]   CBC with Differential (Cancer Center Only)     Status: Abnormal   Collection Time: 08/03/23  8:09 AM  Result Value Ref Range   WBC Count 10.3 4.0 - 10.5 K/uL   RBC 3.62 (L) 4.22 - 5.81 MIL/uL   Hemoglobin 12.0 (L) 13.0 - 17.0 g/dL   HCT 29.5 (L) 28.4 - 13.2 %   MCV 99.2 80.0 - 100.0 fL   MCH 33.1 26.0 - 34.0 pg   MCHC 33.4 30.0 - 36.0 g/dL   RDW 44.0 (H) 10.2 - 72.5 %   Platelet Count 84 (L) 150 - 400 K/uL    Comment: SPECIMEN CHECKED FOR CLOTS   nRBC 0.0 0.0 - 0.2 %   Neutrophils Relative % 83 %   Neutro Abs 8.6 (H) 1.7 - 7.7 K/uL   Lymphocytes Relative 9 %   Lymphs Abs 0.9 0.7 - 4.0 K/uL   Monocytes Relative 6 %   Monocytes Absolute 0.6 0.1 - 1.0 K/uL   Eosinophils Relative 1 %   Eosinophils Absolute 0.1 0.0 - 0.5 K/uL   Basophils Relative 0 %   Basophils Absolute 0.0 0.0 - 0.1 K/uL   Immature Granulocytes 1 %   Abs Immature Granulocytes 0.05 0.00 - 0.07 K/uL    Comment: Performed at [facility], [address] [place], [place] [address]  CMP (Cancer Center only)     Status: Abnormal   Collection Time: 08/03/23  8:09 AM  Result Value Ref Range   Sodium 136 135 - 145 mmol/L   Potassium 3.4 (L) 3.5 - 5.1 mmol/L   Chloride 106 98 - 111 mmol/L   CO2 23 22 - 32 mmol/L   Glucose, Bld 114 (H) 70 - 99 mg/dL    Comment: Glucose reference range applies only to samples taken after fasting for at least 8 hours.   BUN 25 (H) 8 - 23 mg/dL   Creatinine 0.34 7.42 - 1.24 mg/dL   Calcium 8.7 (L) 8.9 - 10.3 mg/dL   Total Protein 7.4 6.5 - 8.1 g/dL   Albumin 3.9 3.5 - 5.0 g/dL   AST 21 15 - 41 U/L   ALT 12 0 - 44 U/L   Alkaline Phosphatase 96 38 - 126 U/L   Total Bilirubin 0.8 0.3 - 1.2 mg/dL   GFR, Estimated >59 >56 mL/min    Comment: (NOTE) Calculated using the CKD-EPI Creatinine Equation (2021)    Anion gap 7 5 - 15    Comment: Performed at [facility], [address] [place], [place] [address]  CEA     Status: Abnormal   Collection Time: 08/03/23  8:09 AM  Result Value Ref Range   CEA 14.2 (H) 0.0 - 4.7 ng/mL    Comment: (NOTE)                             Nonsmokers          <3.9                             Smokers             <5.6 [employer] Electrochemiluminescence Immunoassay (ECLIA) Values obtained with different assay methods or kits cannot be used interchangeably.  Results cannot be interpreted as absolute evidence of the presence or absence of malignant disease. Performed At: [facility]  [address] [place], [place] [id number] Jolene Schimke MD [id number]       Assessment & Plan:  As per problem list.  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   Total time spent: 40 minutes  Luna Fuse, MD  08/05/2023   This document may have been prepared by [facility] Voice Recognition software and as such may include unintentional dictation errors.

## 2023-08-07 ENCOUNTER — Telehealth: Payer: Self-pay | Admitting: *Deleted

## 2023-08-07 NOTE — Telephone Encounter (Signed)
Dr. Smith Robert said we need a biopsy of abdominal mass. They can work you in on August 29. There are instructions : You must stop your baby aspirin starting Sat-8/24 and you stay off of it until you get the biopsy done and the evening of the biopsy you can start it back. You have to not eat or drink 8 hours before the biopsy. If you have any meds that you have to take just take sip of water to get the pills down. You will need a driver because they are giving you conscious sedation and you will need to have someone to take you back home. You will got the heart and vascular which is in the front of hospital passed the ER.  Let me know if you are ok with this. Call me me send my chart  I also said this on your phone message also  Thanks Cordelia Pen

## 2023-08-08 ENCOUNTER — Telehealth: Payer: Self-pay | Admitting: *Deleted

## 2023-08-08 ENCOUNTER — Other Ambulatory Visit (HOSPITAL_COMMUNITY): Payer: Self-pay

## 2023-08-08 ENCOUNTER — Other Ambulatory Visit: Payer: Self-pay

## 2023-08-08 MED ORDER — FRUQUINTINIB 5 MG PO CAPS
5.0000 mg | ORAL_CAPSULE | Freq: Every day | ORAL | 0 refills | Status: DC
  Filled 2023-08-08: qty 21, 21d supply, fill #0
  Filled 2023-08-17: qty 21, 28d supply, fill #0

## 2023-08-08 NOTE — Telephone Encounter (Signed)
Called the pt. And got voicemail. I wanted him  to call me to make sure he got my message about bx. And that he had to stop ASA for 5 days. I did not hear back from him, so  I called again. Waiting to hear from him

## 2023-08-08 NOTE — Telephone Encounter (Signed)
Pt called back and he will do the bx and he stopped his ASA on Friday, he was given the NPO for 8 hours, then have to have a driver to go home and go to front of the [facility] from the [employer] [address] he is good with this

## 2023-08-09 ENCOUNTER — Telehealth: Payer: Self-pay | Admitting: *Deleted

## 2023-08-09 NOTE — Telephone Encounter (Signed)
Pt called to say that he will keep the biopsy. He does want to get a new tx. . I called back and left him message that Dr. Smith Robert is good with bx-hopeful we will get info that see if there is a clinical trial or if the new pill fruquintinib . Dr. Smith Robert will go over the options on 9/4.

## 2023-08-10 ENCOUNTER — Other Ambulatory Visit (HOSPITAL_COMMUNITY): Payer: Self-pay | Admitting: Student

## 2023-08-10 DIAGNOSIS — C772 Secondary and unspecified malignant neoplasm of intra-abdominal lymph nodes: Secondary | ICD-10-CM

## 2023-08-10 NOTE — Progress Notes (Signed)
Patient for CT guided LT Retroperitoneal LN Biopsy on Thurs 08/11/2023, Cordelia Pen, RN from the [facility] talked with this patient x 3 this week on the phone and gave pre-procedure instructions. Cordelia Pen, RN made pt aware to be here at 9a, last dose of ASA 81mg  was Sat 08/06/2023, NPO after MN prior to procedure as well as driver post procedure/recovery/discharge. Jimmy Footman, RN

## 2023-08-11 ENCOUNTER — Other Ambulatory Visit: Payer: Self-pay

## 2023-08-11 ENCOUNTER — Other Ambulatory Visit: Payer: Self-pay | Admitting: Interventional Radiology

## 2023-08-11 ENCOUNTER — Ambulatory Visit
Admission: RE | Admit: 2023-08-11 | Discharge: 2023-08-11 | Disposition: A | Discharge status: Home / Self Care | Payer: Medicare HMO | Source: Ambulatory Visit | Attending: Interventional Radiology | Admitting: Interventional Radiology

## 2023-08-11 ENCOUNTER — Ambulatory Visit
Admission: RE | Admit: 2023-08-11 | Discharge: 2023-08-11 | Disposition: A | Discharge status: Home / Self Care | Payer: Medicare HMO | Source: Ambulatory Visit | Attending: Oncology | Admitting: Oncology

## 2023-08-11 DIAGNOSIS — R160 Hepatomegaly, not elsewhere classified: Secondary | ICD-10-CM | POA: Diagnosis not present

## 2023-08-11 DIAGNOSIS — C787 Secondary malignant neoplasm of liver and intrahepatic bile duct: Secondary | ICD-10-CM | POA: Diagnosis not present

## 2023-08-11 DIAGNOSIS — E785 Hyperlipidemia, unspecified: Secondary | ICD-10-CM | POA: Diagnosis not present

## 2023-08-11 DIAGNOSIS — C772 Secondary and unspecified malignant neoplasm of intra-abdominal lymph nodes: Secondary | ICD-10-CM | POA: Diagnosis not present

## 2023-08-11 DIAGNOSIS — C189 Malignant neoplasm of colon, unspecified: Secondary | ICD-10-CM | POA: Diagnosis not present

## 2023-08-11 DIAGNOSIS — C801 Malignant (primary) neoplasm, unspecified: Secondary | ICD-10-CM | POA: Diagnosis not present

## 2023-08-11 DIAGNOSIS — R599 Enlarged lymph nodes, unspecified: Secondary | ICD-10-CM | POA: Diagnosis not present

## 2023-08-11 DIAGNOSIS — J432 Centrilobular emphysema: Secondary | ICD-10-CM | POA: Diagnosis not present

## 2023-08-11 LAB — PROTIME-INR
INR: 1.1 (ref 0.8–1.2)
Prothrombin Time: 14.3 seconds (ref 11.4–15.2)

## 2023-08-11 LAB — CBC
HCT: 37.8 % — ABNORMAL LOW (ref 39.0–52.0)
Hemoglobin: 12.5 g/dL — ABNORMAL LOW (ref 13.0–17.0)
MCH: 32.5 pg (ref 26.0–34.0)
MCHC: 33.1 g/dL (ref 30.0–36.0)
MCV: 98.2 fL (ref 80.0–100.0)
Platelets: 111 10*3/uL — ABNORMAL LOW (ref 150–400)
RBC: 3.85 MIL/uL — ABNORMAL LOW (ref 4.22–5.81)
RDW: 15.6 % — ABNORMAL HIGH (ref 11.5–15.5)
WBC: 4.7 10*3/uL (ref 4.0–10.5)
nRBC: 0 % (ref 0.0–0.2)

## 2023-08-11 MED ORDER — FENTANYL CITRATE (PF) 100 MCG/2ML IJ SOLN
INTRAMUSCULAR | Status: AC | PRN
  Administered 2023-08-11 (×2): 25 ug via INTRAVENOUS
  Administered 2023-08-11: 50 ug via INTRAVENOUS

## 2023-08-11 MED ORDER — MIDAZOLAM HCL 2 MG/2ML IJ SOLN
INTRAMUSCULAR | Status: AC
  Filled 2023-08-11: qty 2

## 2023-08-11 MED ORDER — MIDAZOLAM HCL 2 MG/2ML IJ SOLN
INTRAMUSCULAR | Status: AC | PRN
  Administered 2023-08-11: .5 mg via INTRAVENOUS
  Administered 2023-08-11: 1 mg via INTRAVENOUS
  Administered 2023-08-11: .5 mg via INTRAVENOUS

## 2023-08-11 MED ORDER — SODIUM CHLORIDE 0.9 % IV SOLN: INTRAVENOUS | Status: DC

## 2023-08-11 MED ORDER — FENTANYL CITRATE (PF) 100 MCG/2ML IJ SOLN
INTRAMUSCULAR | Status: AC
  Filled 2023-08-11: qty 2

## 2023-08-11 NOTE — Procedures (Signed)
Interventional Radiology Procedure Note  Date of Procedure: 08/11/2023  Procedure: CT core needle biopsy retroperitoneal LN   Findings:  1. CT core needle biopsy left retroperitoneal LN 18ga x6 passes    Complications: No immediate complications noted.   Estimated Blood Loss: minimal  Follow-up and Recommendations: 1. Bedrest 1 hour    Olive Bass, MD  Vascular & Interventional Radiology  08/11/2023 11:47 AM

## 2023-08-11 NOTE — Progress Notes (Signed)
Patient clinically stable post CT LN biopsy per Dr Payton Mccallum Abd, tolerated well. Denies complaints post procedure. Vitals stable pre and post procedure. Received Versed 2 mg along with Fentanyl 100 mcg IV for procedure. Report given to [facility] post procedure/specials./18

## 2023-08-11 NOTE — H&P (Signed)
Chief Complaint: Patient was seen in consultation today for colon cancer with metastatic spread  Referring Physician(s): Rao,Archana C  Supervising Physician: Pernell Dupre  Patient Status: [facility] - Out-pt  History of Present Illness: Eric Simon is a 72 y.o. male with PMH significant for COPD and hyperlipidemia being seen today in relation to colon cancer with metastatic spread. On recent PET scan, patient was found to have new hypermetabolic liver lesions. Patient also found to have suspected metastasis to abdominal lymph nodes. Patient is under the care of Dr Smith Robert from Oncology service who has referred patient to IR for image-guided biopsy.   Past Medical History:  Diagnosis Date   Colon cancer (HCC)    COPD (chronic obstructive pulmonary disease) (HCC)    Hyperlipidemia     Past Surgical History:  Procedure Laterality Date   COLONOSCOPY WITH PROPOFOL N/A 06/26/2019   Procedure: COLONOSCOPY WITH PROPOFOL;  Surgeon: Wyline Mood, MD;  Location: [facility] ENDOSCOPY;  Service: Gastroenterology;  Laterality: N/A;   ESOPHAGOGASTRODUODENOSCOPY (EGD) WITH PROPOFOL N/A 01/12/2023   Procedure: ESOPHAGOGASTRODUODENOSCOPY (EGD) WITH PROPOFOL;  Surgeon: Wyline Mood, MD;  Location: [facility] ENDOSCOPY;  Service: Gastroenterology;  Laterality: N/A;   FLEXIBLE SIGMOIDOSCOPY N/A 05/07/2021   Procedure: FLEXIBLE SIGMOIDOSCOPY;  Surgeon: Wyline Mood, MD;  Location: [facility] ENDOSCOPY;  Service: Gastroenterology;  Laterality: N/A;   PORTA CATH INSERTION N/A 06/25/2019   Procedure: PORTA CATH INSERTION;  Surgeon: Annice Needy, MD;  Location: [facility] INVASIVE CV LAB;  Service: Cardiovascular;  Laterality: N/A;   PORTA CATH INSERTION Left 08/11/2020   Procedure: PORTA CATH INSERTION;  Surgeon: Annice Needy, MD;  Location: [facility] INVASIVE CV LAB;  Service: Cardiovascular;  Laterality: Left;   PORTA CATH REMOVAL Right 08/11/2020   Procedure: PORTA CATH REMOVAL;  Surgeon: Annice Needy, MD;  Location: [facility] INVASIVE CV  LAB;  Service: Cardiovascular;  Laterality: Right;    Allergies: Patient has no known allergies.  Medications: Prior to Admission medications   Medication Sig Start Date End Date Taking? Authorizing Provider  aspirin EC 81 MG tablet Take 81 mg by mouth daily.   Yes [provider]  budesonide-formoterol (SYMBICORT) 80-4.5 MCG/ACT inhaler USE 2 INHALATION BY MOUTH TWICE DAILY 08/05/23  Yes Tejan-Sie, Marcelino Freestone, MD  cetirizine (ZYRTEC) 10 MG tablet TAKE 1 TABLET BY MOUTH DAILY 06/13/23  Yes Tejan-Sie, Marcelino Freestone, MD  dexlansoprazole (DEXILANT) 60 MG capsule Take by mouth. 01/31/23 08/11/23 Yes [provider]  DULoxetine (CYMBALTA) 30 MG capsule Take 1 capsule (30 mg total) by mouth 2 (two) times daily. 05/05/20  Yes Creig Hines, MD  lactulose (CHRONULAC) 10 GM/15ML solution Take 15 mLs (10 g total) by mouth 3 (three) times daily as needed for mild constipation. 08/03/23  Yes Creig Hines, MD  lisinopril (ZESTRIL) 5 MG tablet Take 1 tablet (5 mg total) by mouth daily. 05/06/23  Yes Tejan-Sie, Marcelino Freestone, MD  montelukast (SINGULAIR) 10 MG tablet Take 10 mg by mouth at bedtime.   Yes [provider]  pregabalin (LYRICA) 75 MG capsule TAKE 1 CAPSULE BY MOUTH EVERY MORNING AND TAKE 2 CAPSULES AT BEDTIME 06/13/23  Yes Thayil, Irene T, PA-C  simvastatin (ZOCOR) 20 MG tablet Take 1 tablet (20 mg total) by mouth every evening. 05/06/23  Yes Tejan-Sie, Marcelino Freestone, MD  tamsulosin (FLOMAX) 0.4 MG CAPS capsule Take 0.4 mg by mouth.    Yes [provider]  aspirin 81 MG chewable tablet Chew by mouth. Patient not taking: Reported on 08/11/2023  [provider]  azelastine (ASTELIN) 0.1 % nasal spray Place 1 spray into both nostrils daily as needed. 08/23/19   [provider]  cyclobenzaprine (FLEXERIL) 10 MG tablet TAKE 1 TABLET(10 MG) BY MOUTH THREE TIMES DAILY AS NEEDED FOR MUSCLE SPASMS Patient not taking: Reported on 08/11/2023 07/13/22   Creig Hines, MD   dexlansoprazole (DEXILANT) 60 MG capsule Take 1 capsule (60 mg total) by mouth every morning. 08/05/23 02/01/24  [name], MD  fruquintinib (FRUZAQLA) 5 MG capsule Take 1 capsule (5 mg total) by mouth daily. Take for 21 days on, then off for 7 days. Repeat every 28 days. 08/08/23   Creig Hines, MD  lidocaine-prilocaine (EMLA) cream Apply 1 Application topically as needed (pt will put 1 inch of cream over port 1 hourfor each treatment, cover plastic over the cream protect clothes). 11/16/22   Creig Hines, MD  nicotine (NICODERM CQ - DOSED IN MG/24 HOURS) 21 mg/24hr patch Place onto the skin. Patient not taking: Reported on 08/11/2023 03/29/23   [provider]  triamcinolone (NASACORT ALLERGY 24HR) 55 MCG/ACT AERO nasal inhaler Place 2 sprays into the nose daily. 05/06/23 08/04/23  [name], MD  trifluridine-tipiracil (LONSURF) 20-8.19 MG tablet Take 2 tablets (40 mg of trifluridine total) by mouth 2 (two) times daily. 1hr after AM & PM meals days 1-5, 8-12. Repeat every 28d. Patient not taking: Reported on 08/11/2023 06/22/23   Creig Hines, MD  prochlorperazine (COMPAZINE) 10 MG tablet Take 1 tablet (10 mg total) by mouth every 6 (six) hours as needed (Nausea or vomiting). 06/20/19 07/20/21  Creig Hines, MD     Family History  Problem Relation Age of Onset   Brain cancer Father     Social History   Socioeconomic History   Marital status: Single    Spouse name: Not on file   Number of children: Not on file   Years of education: Not on file   Highest education level: Not on file  Occupational History   Not on file  Tobacco Use   Smoking status: Every Day    Current packs/day: 0.50    Average packs/day: 0.5 packs/day for 40.0 years (20.0 ttl pk-yrs)    Types: Cigarettes   Smokeless tobacco: Never   Tobacco comments:    smoking 40 years  Vaping Use   Vaping status: Never Used  Substance and Sexual Activity   Alcohol use: No   Drug use: No   Sexual  activity: Not Currently  Other Topics Concern   Not on file  Social History Narrative   Not on file   Social Determinants of Health   Financial Resource Strain: Low Risk  (03/02/2023)   Received from [facility], [facility]   Overall Financial Resource Strain (CARDIA)    Difficulty of Paying Living Expenses: Not very hard  Food Insecurity: No Food Insecurity (03/02/2023)   Received from [facility], [facility]   Hunger Vital Sign    Worried About Running Out of Food in the Last Year: Never true    Ran Out of Food in the Last Year: Never true  Transportation Needs: No Transportation Needs (03/02/2023)   Received from [facility], [facility]   [facility] - Transportation    Lack of Transportation (Medical): No    Lack of Transportation (Non-Medical): No  Physical Activity: Not on file  Stress: No Stress Concern Present (06/12/2019)   [employer] of  Occupational Health - Occupational Stress Questionnaire    Feeling of Stress : Not at all  Social Connections: Unknown (06/12/2019)   Social Connection and Isolation Panel [NHANES]    Frequency of Communication with Friends and Family: More than three times a week    Frequency of Social Gatherings with Friends and Family: Not on file    Attends Religious Services: Not on file    Active Member of Clubs or Organizations: Not on file    Attends Banker Meetings: Not on file    Marital Status: Not on file    Code Status: Full Code  Review of Systems: A 12 point ROS discussed and pertinent positives are indicated in the HPI above.  All other systems are negative.  Review of Systems  Constitutional:  Negative for chills and fever.  Respiratory:  Negative for chest tightness and shortness of breath.   Cardiovascular:  Negative for chest pain and leg swelling.  Gastrointestinal:  Negative for abdominal pain, diarrhea, nausea and vomiting.  Neurological:  Positive for headaches. Negative for  dizziness.  Psychiatric/Behavioral:  Negative for confusion.     Vital Signs: BP (!) 152/85   Pulse 74   Temp 97.7 F (36.5 C) (Oral)   Resp 19   Ht 5\' 7"  (1.702 m)   Wt 141 lb (64 kg)   SpO2 99%   BMI 22.08 kg/m    Physical Exam Vitals reviewed.  Constitutional:      General: He is not in acute distress. Cardiovascular:     Rate and Rhythm: Normal rate and regular rhythm.     Pulses: Normal pulses.     Heart sounds: Normal heart sounds.  Pulmonary:     Effort: Pulmonary effort is normal.     Breath sounds: Normal breath sounds.  Abdominal:     Palpations: Abdomen is soft.     Tenderness: There is no abdominal tenderness.  Musculoskeletal:     Right lower leg: No edema.     Left lower leg: No edema.  Skin:    General: Skin is warm and dry.  Neurological:     Mental Status: He is alert and oriented to person, place, and time.  Psychiatric:        Mood and Affect: Mood normal.        Behavior: Behavior normal.        Thought Content: Thought content normal.        Judgment: Judgment normal.     Imaging: NM PET Image Restag (PS) Skull Base To Thigh  Result Date: 07/20/2023 CLINICAL DATA:  Subsequent treatment strategy for colon cancer. EXAM: NUCLEAR MEDICINE PET SKULL BASE TO THIGH TECHNIQUE: 7.4 mCi F-18 FDG was injected intravenously. Full-ring PET imaging was performed from the skull base to thigh after the radiotracer. CT data was obtained and used for attenuation correction and anatomic localization. Fasting blood glucose: 87 mg/dl COMPARISON:  38/75/6433. FINDINGS: Mediastinal blood pool activity: SUV max 2.1 Liver activity: SUV max NA NECK: No abnormal hypermetabolism. Incidental CT findings: None. CHEST: Ill-defined nodular consolidation in the medial aspect of the superior segment right lower lobe (4/57) measures approximately 10 mm (4/57), SUV max 3.4, stable. Incidental CT findings: Left IJ Port-A-Cath terminates in the high right atrium. Atherosclerotic  calcification of the aorta and coronary arteries. Heart is at the upper limits of normal in size to mildly enlarged. No pericardial effusion. Centrilobular and paraseptal emphysema. Mild dependent atelectasis bilaterally. ABDOMEN/PELVIS: There are new hypermetabolic lesions the right  hepatic lobe. Index 11 mm low-attenuation lesion in the medial right hepatic lobe (4/80), SUV max 8.7. A single faint hypermetabolic nodule in the dome of the left hepatic lobe, SUV max 3.5, poorly visualized on CT. Slightly increased hypermetabolism associated with hypermetabolic retroperitoneal and mesenteric adenopathy, as before. Index aortocaval lymph node measures 1.4 cm (4/95), SUV max 13.6, compared to 2.3 cm and 17.4 previously. Increased focal hypermetabolism associated with the sigmoid colon, SUV max 16.5, compared to 12.8 previously. New foci of perirectal hypermetabolism appear to correspond to vessels rather than lymph nodes. Index retroperitoneal lymph node along the left common iliac vein measures 3 mm (4/124), SUV max 3.3, compared to 10 mm and 15.9 previously. Incidental CT findings: Liver, gallbladder, adrenal glands, kidneys, spleen, pancreas, stomach and bowel are otherwise grossly unremarkable. Atherosclerotic calcification of the aorta. Tiny umbilical hernia contains fat. No pelvic free fluid. SKELETON: No abnormal hypermetabolism. Incidental CT findings: None. IMPRESSION: 1. Mixed response to therapy as evidenced by decrease in size and hypermetabolism of mesenteric and abdominopelvic retroperitoneal nodes but new hypermetabolic hepatic metastases. 2. Hypermetabolic nodular consolidation in the superior segment right lower lobe, similar. Recommend attention on follow-up as a metastasis cannot be excluded. 3. Increasing focal hypermetabolism in the sigmoid colon, indicative of disease recurrence, as before. 4. Aortic atherosclerosis (ICD10-I70.0). Coronary artery calcification. 5.  Emphysema (ICD10-J43.9).  Electronically Signed   By: Leanna Battles M.D.   On: 07/20/2023 14:16    Labs:  CBC: Recent Labs    07/06/23 0805 07/20/23 0832 08/03/23 0809 08/11/23 0903  WBC 4.8 2.4* 10.3 4.7  HGB 10.8* 10.4* 12.0* 12.5*  HCT 32.6* 30.9* 35.9* 37.8*  PLT 107* 80* 84* 111*    COAGS: Recent Labs    08/11/23 0903  INR 1.1    BMP: Recent Labs    05/18/23 0819 06/22/23 0828 07/06/23 0805 08/03/23 0809  NA 140 140 137 136  K 3.7 3.7 3.7 3.4*  CL 109 111 108 106  CO2 23 22 23 23   GLUCOSE 94 115* 101* 114*  BUN 25* 22 23 25*  CALCIUM 8.3* 8.3* 8.5* 8.7*  CREATININE 1.01 0.88 0.89 0.90  GFRNONAA >60 >60 >60 >60    LIVER FUNCTION TESTS: Recent Labs    05/18/23 0819 06/22/23 0828 07/06/23 0805 08/03/23 0809  BILITOT 0.6 0.6 0.6 0.8  AST 21 20 23 21   ALT 12 9 12 12   ALKPHOS 83 84 80 96  PROT 6.7 6.8 7.2 7.4  ALBUMIN 3.6 3.7 3.8 3.9    TUMOR MARKERS: No results for input(s): "AFPTM", "CEA", "CA199", "CHROMGRNA" in the last 8760 hours.  Assessment and Plan:  Charan Nuzzi is a 72 yo male being seen today in relation to colon cancer with metastatic spread. Patient has been referred to IR for image-guided biopsy. Desired target is for liver lesions seen on recent PET scan, but if these are not targetable, plan for image-guide left retroperitoneal lymph node biopsy. Case has been reviewed with Dr Juliette Alcide. Patient is NPO and presents today in their usual state of health.  Risks and benefits of image-guided liver biopsy with possible image-guided left retroperitoneal lymph node biopsy was discussed with the patient and/or patient's family including, but not limited to bleeding, infection, damage to adjacent structures or low yield requiring additional tests.  All of the questions were answered and there is agreement to proceed.  Consent signed and in chart.   Thank you for this interesting consult.  I greatly enjoyed meeting Monico Hoar and  look forward to  participating in their care.  A copy of this report was sent to the requesting provider on this date.  Electronically Signed: Kennieth Francois, PA-C 08/11/2023, 9:42 AM   I spent a total of 15 Minutes   in face to face in clinical consultation, greater than 50% of which was counseling/coordinating care for colon cancer with metastatic spread.

## 2023-08-12 NOTE — Telephone Encounter (Signed)
Patient RTC next week to discuss medication

## 2023-08-15 ENCOUNTER — Encounter: Payer: Self-pay | Admitting: Oncology

## 2023-08-17 ENCOUNTER — Ambulatory Visit: Payer: Medicare HMO

## 2023-08-17 ENCOUNTER — Other Ambulatory Visit: Payer: Medicare HMO

## 2023-08-17 ENCOUNTER — Inpatient Hospital Stay (HOSPITAL_BASED_OUTPATIENT_CLINIC_OR_DEPARTMENT_OTHER): Payer: Medicare HMO | Admitting: Oncology

## 2023-08-17 ENCOUNTER — Ambulatory Visit: Payer: Medicare HMO | Admitting: Oncology

## 2023-08-17 ENCOUNTER — Inpatient Hospital Stay: Payer: Medicare HMO | Attending: Oncology

## 2023-08-17 ENCOUNTER — Other Ambulatory Visit: Payer: Self-pay

## 2023-08-17 ENCOUNTER — Other Ambulatory Visit (HOSPITAL_COMMUNITY): Payer: Self-pay

## 2023-08-17 ENCOUNTER — Encounter: Payer: Self-pay | Admitting: Oncology

## 2023-08-17 ENCOUNTER — Inpatient Hospital Stay: Payer: Medicare HMO

## 2023-08-17 ENCOUNTER — Inpatient Hospital Stay: Payer: Medicare HMO | Admitting: Pharmacist

## 2023-08-17 VITALS — BP 126/58 | HR 76 | Temp 97.0°F | Resp 18 | Ht 67.0 in | Wt 143.4 lb

## 2023-08-17 DIAGNOSIS — C772 Secondary and unspecified malignant neoplasm of intra-abdominal lymph nodes: Secondary | ICD-10-CM

## 2023-08-17 DIAGNOSIS — C187 Malignant neoplasm of sigmoid colon: Secondary | ICD-10-CM | POA: Diagnosis not present

## 2023-08-17 DIAGNOSIS — C189 Malignant neoplasm of colon, unspecified: Secondary | ICD-10-CM

## 2023-08-17 DIAGNOSIS — Z79899 Other long term (current) drug therapy: Secondary | ICD-10-CM

## 2023-08-17 DIAGNOSIS — T50905A Adverse effect of unspecified drugs, medicaments and biological substances, initial encounter: Secondary | ICD-10-CM

## 2023-08-17 DIAGNOSIS — D6959 Other secondary thrombocytopenia: Secondary | ICD-10-CM | POA: Diagnosis not present

## 2023-08-17 DIAGNOSIS — C787 Secondary malignant neoplasm of liver and intrahepatic bile duct: Secondary | ICD-10-CM | POA: Insufficient documentation

## 2023-08-17 DIAGNOSIS — Z7189 Other specified counseling: Secondary | ICD-10-CM | POA: Diagnosis not present

## 2023-08-17 LAB — CMP (CANCER CENTER ONLY)
ALT: 13 U/L (ref 0–44)
AST: 25 U/L (ref 15–41)
Albumin: 3.4 g/dL — ABNORMAL LOW (ref 3.5–5.0)
Alkaline Phosphatase: 71 U/L (ref 38–126)
Anion gap: 6 (ref 5–15)
BUN: 17 mg/dL (ref 8–23)
CO2: 22 mmol/L (ref 22–32)
Calcium: 8 mg/dL — ABNORMAL LOW (ref 8.9–10.3)
Chloride: 107 mmol/L (ref 98–111)
Creatinine: 0.88 mg/dL (ref 0.61–1.24)
GFR, Estimated: >60 mL/min (ref >60)
Glucose, Bld: 93 mg/dL (ref 70–99)
Potassium: 3.5 mmol/L (ref 3.5–5.1)
Sodium: 135 mmol/L (ref 135–145)
Total Bilirubin: 0.6 mg/dL (ref 0.3–1.2)
Total Protein: 6.7 g/dL (ref 6.5–8.1)

## 2023-08-17 LAB — CBC WITH DIFFERENTIAL (CANCER CENTER ONLY)
Abs Immature Granulocytes: 0.01 10*3/uL (ref 0.00–0.07)
Basophils Absolute: 0 10*3/uL (ref 0.0–0.1)
Basophils Relative: 0 %
Eosinophils Absolute: 0 10*3/uL (ref 0.0–0.5)
Eosinophils Relative: 0 %
HCT: 30.2 % — ABNORMAL LOW (ref 39.0–52.0)
Hemoglobin: 10 g/dL — ABNORMAL LOW (ref 13.0–17.0)
Immature Granulocytes: 0 %
Lymphocytes Relative: 14 %
Lymphs Abs: 0.4 10*3/uL — ABNORMAL LOW (ref 0.7–4.0)
MCH: 32.6 pg (ref 26.0–34.0)
MCHC: 33.1 g/dL (ref 30.0–36.0)
MCV: 98.4 fL (ref 80.0–100.0)
Monocytes Absolute: 0.4 10*3/uL (ref 0.1–1.0)
Monocytes Relative: 14 %
Neutro Abs: 2 10*3/uL (ref 1.7–7.7)
Neutrophils Relative %: 72 %
Platelet Count: 75 10*3/uL — ABNORMAL LOW (ref 150–400)
RBC: 3.07 MIL/uL — ABNORMAL LOW (ref 4.22–5.81)
RDW: 16 % — ABNORMAL HIGH (ref 11.5–15.5)
WBC Count: 2.8 10*3/uL — ABNORMAL LOW (ref 4.0–10.5)
nRBC: 0 % (ref 0.0–0.2)

## 2023-08-17 MED ORDER — HEPARIN SOD (PORK) LOCK FLUSH 100 UNIT/ML IV SOLN
500.0000 [IU] | Freq: Once | INTRAVENOUS | Status: AC
  Administered 2023-08-17: 500 [IU] via INTRAVENOUS
  Filled 2023-08-17: qty 5

## 2023-08-17 NOTE — Progress Notes (Signed)
Hematology/Oncology Consult note [facility]  Telephone:([phone] [phone]  Patient Care Team: [name], MD as PCP - General (Internal Medicine) Benita Gutter, RN as Oncology Nurse Navigator Creig Hines, MD as Consulting Physician (Hematology and Oncology)   Name of the patient: Eric Simon  [id number]  01-27-1951   Date of visit: 08/17/23  Diagnosis-metastatic colon cancer with lymph node metastases  Chief complaint/ Reason for visit-discuss further management of colon cancer  Heme/Onc history: Patient is a 72 yr old male with >40 pack year history of smoking. He currently smokes 0.5ppd. he presented to the ER with symptoms of left sided chest pain and left arm pain. Troponin was negative ekg was unremarkable. Ct chest showed no PE. He was incidentally noted to have mediastinal and retrocrural adenopathy and a 2.1X2.3X4.4 cm retroaortic soft tissue lesion in the posterior left chest. 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  Low-grade activity in the left lateral fifth and sixth ribs associated with nondisplaced fractures likely reflecting healing response problem malignancy.   Patient underwent CT-guided biopsy of the retroperitoneal lymph node pathology showed metastatic adenocarcinoma compatible with colorectal origin.  CK7 negative.  CK20 positive.  CDX 2+.  TTF-1 negative.  PSA negative.  This pattern of immunoreactivity supports the above diagnosis. Patient underwent colonoscopy which showed sigmoid mass that was consistent with adenocarcinoma.  RAS panel testing showed that he was wild-type for both K-ras and BRAF   FOLFOX and bevacizumab chemotherapy started in July 2020.  Subsequently oxaliplatin has been on hold given neuropathy.  He was switched to irinotecan Xeloda panitumumab regimen in August 2022.  Scans  in October 2023  have shown slow increase in the size of intra-abdominal adenopathy but patient continues to have low-volume disease.  Patient received palliative radiation therapy to the enlarging external iliac lymph nodes.  Repeat lymph node biopsy could not be obtained due to location for foundation 1 testing.  Peripheral blood NGS testing did not show any actionable mutations.  Further disease progression noted in the intra-abdominal lymph nodes on PET scan and patient was switched to Lonsurf Avastin regimen in April 2024.  Disease progression in August 2024 with appearance of new intrahepatic metastases.  Also increase in size of primary sigmoid colon lesion.  CEA trending up.  No clinical trials available.  Plan is to switch him to fourth line fruquintinib  Interval history-he is doing well overall.  Weight has stabilized in the last 2 to 3 weeks.  Appetite is fair  ECOG PS- 1 Pain scale- 0  Review of systems- Review of Systems  Constitutional:  Positive for malaise/fatigue. Negative for chills, fever and weight loss.  HENT:  Negative for congestion, ear discharge and nosebleeds.   Eyes:  Negative for blurred vision.  Respiratory:  Negative for cough, hemoptysis, sputum production, shortness of breath and wheezing.   Cardiovascular:  Negative for chest pain, palpitations, orthopnea and claudication.  Gastrointestinal:  Negative for abdominal pain, blood in stool, constipation, diarrhea, heartburn, melena, nausea and vomiting.  Genitourinary:  Negative for dysuria, flank pain, frequency, hematuria and urgency.  Musculoskeletal:  Negative for back pain, joint pain and myalgias.  Skin:  Negative for rash.  Neurological:  Negative for dizziness, tingling, focal weakness, seizures, weakness and headaches.  Endo/Heme/Allergies:  Does not bruise/bleed easily.  Psychiatric/Behavioral:  Negative for depression and suicidal ideas. The patient does not  have insomnia.       No Known  Allergies   Past Medical History:  Diagnosis Date   Colon cancer (HCC)    COPD (chronic obstructive pulmonary disease) (HCC)    Hyperlipidemia      Past Surgical History:  Procedure Laterality Date   COLONOSCOPY WITH PROPOFOL N/A 06/26/2019   Procedure: COLONOSCOPY WITH PROPOFOL;  Surgeon: Wyline Mood, MD;  Location: [facility] ENDOSCOPY;  Service: Gastroenterology;  Laterality: N/A;   ESOPHAGOGASTRODUODENOSCOPY (EGD) WITH PROPOFOL N/A 01/12/2023   Procedure: ESOPHAGOGASTRODUODENOSCOPY (EGD) WITH PROPOFOL;  Surgeon: Wyline Mood, MD;  Location: [facility] ENDOSCOPY;  Service: Gastroenterology;  Laterality: N/A;   FLEXIBLE SIGMOIDOSCOPY N/A 05/07/2021   Procedure: FLEXIBLE SIGMOIDOSCOPY;  Surgeon: Wyline Mood, MD;  Location: [facility] ENDOSCOPY;  Service: Gastroenterology;  Laterality: N/A;   PORTA CATH INSERTION N/A 06/25/2019   Procedure: PORTA CATH INSERTION;  Surgeon: Annice Needy, MD;  Location: [facility] INVASIVE CV LAB;  Service: Cardiovascular;  Laterality: N/A;   PORTA CATH INSERTION Left 08/11/2020   Procedure: PORTA CATH INSERTION;  Surgeon: Annice Needy, MD;  Location: [facility] INVASIVE CV LAB;  Service: Cardiovascular;  Laterality: Left;   PORTA CATH REMOVAL Right 08/11/2020   Procedure: PORTA CATH REMOVAL;  Surgeon: Annice Needy, MD;  Location: [facility] INVASIVE CV LAB;  Service: Cardiovascular;  Laterality: Right;    Social History   Socioeconomic History   Marital status: Single    Spouse name: Not on file   Number of children: Not on file   Years of education: Not on file   Highest education level: Not on file  Occupational History   Not on file  Tobacco Use   Smoking status: Every Day    Current packs/day: 0.50    Average packs/day: 0.5 packs/day for 40.0 years (20.0 ttl pk-yrs)    Types: Cigarettes   Smokeless tobacco: Never   Tobacco comments:    smoking 40 years  Vaping Use   Vaping status: Never Used  Substance and Sexual Activity   Alcohol use: No   Drug use: No   Sexual activity:  Not Currently  Other Topics Concern   Not on file  Social History Narrative   Not on file   Social Determinants of Health   Financial Resource Strain: Low Risk  (03/02/2023)   Received from [facility], [facility]   Overall Financial Resource Strain (CARDIA)    Difficulty of Paying Living Expenses: Not very hard  Food Insecurity: No Food Insecurity (03/02/2023)   Received from [facility], [facility]   Hunger Vital Sign    Worried About Running Out of Food in the Last Year: Never true    Ran Out of Food in the Last Year: Never true  Transportation Needs: No Transportation Needs (03/02/2023)   Received from [facility], [facility]   [facility] - [facility]    Lack of Transportation (Medical): No    Lack of Transportation (Non-Medical): No  Physical Activity: Not on file  Stress: No Stress Concern Present (06/12/2019)   [employer] of Occupational Health - Occupational Stress Questionnaire    Feeling of Stress : Not at all  Social Connections: Unknown (06/12/2019)   Social Connection and Isolation Panel [NHANES]    Frequency of Communication with Friends and Family: More than three times a week    Frequency of Social Gatherings with Friends and Family: Not on file    Attends Religious Services: Not on file    Active Member  of Clubs or Organizations: Not on file    Attends Club or Organization Meetings: Not on file    Marital Status: Not on file  Intimate Partner Violence: Not At Risk (06/12/2019)   Humiliation, Afraid, Rape, and Kick questionnaire    Fear of Current or Ex-Partner: No    Emotionally Abused: No    Physically Abused: No    Sexually Abused: No    Family History  Problem Relation Age of Onset   Brain cancer Father      Current Outpatient Medications:    aspirin 81 MG chewable tablet, Chew by mouth., Disp: , Rfl:    aspirin EC 81 MG tablet, Take 81 mg by mouth daily., Disp: , Rfl:    azelastine (ASTELIN) 0.1 % nasal spray, Place  1 spray into both nostrils daily as needed., Disp: , Rfl:    budesonide-formoterol (SYMBICORT) 80-4.5 MCG/ACT inhaler, USE 2 INHALATION BY MOUTH TWICE DAILY, Disp: 10.2 g, Rfl: 2   cetirizine (ZYRTEC) 10 MG tablet, TAKE 1 TABLET BY MOUTH DAILY, Disp: 90 tablet, Rfl: 0   cyclobenzaprine (FLEXERIL) 10 MG tablet, TAKE 1 TABLET(10 MG) BY MOUTH THREE TIMES DAILY AS NEEDED FOR MUSCLE SPASMS, Disp: 42 tablet, Rfl: 0   dexlansoprazole (DEXILANT) 60 MG capsule, Take by mouth., Disp: , Rfl:    dexlansoprazole (DEXILANT) 60 MG capsule, Take 1 capsule (60 mg total) by mouth every morning., Disp: 90 capsule, Rfl: 1   DULoxetine (CYMBALTA) 30 MG capsule, Take 1 capsule (30 mg total) by mouth 2 (two) times daily., Disp: 60 capsule, Rfl: 1   fruquintinib (FRUZAQLA) 5 MG capsule, Take 1 capsule (5 mg total) by mouth daily. Take for 21 days on, then off for 7 days. Repeat every 28 days., Disp: 21 capsule, Rfl: 0   lactulose (CHRONULAC) 10 GM/15ML solution, Take 15 mLs (10 g total) by mouth 3 (three) times daily as needed for mild constipation., Disp: 236 mL, Rfl: 1   lidocaine-prilocaine (EMLA) cream, Apply 1 Application topically as needed (pt will put 1 inch of cream over port 1 hourfor each treatment, cover plastic over the cream protect clothes)., Disp: 30 g, Rfl: 3   lisinopril (ZESTRIL) 5 MG tablet, Take 1 tablet (5 mg total) by mouth daily., Disp: 90 tablet, Rfl: 1   montelukast (SINGULAIR) 10 MG tablet, Take 10 mg by mouth at bedtime., Disp: , Rfl:    nicotine (NICODERM CQ - DOSED IN MG/24 HOURS) 21 mg/24hr patch, Place onto the skin., Disp: , Rfl:    pregabalin (LYRICA) 75 MG capsule, TAKE 1 CAPSULE BY MOUTH EVERY MORNING AND TAKE 2 CAPSULES AT BEDTIME, Disp: 90 capsule, Rfl: 1   simvastatin (ZOCOR) 20 MG tablet, Take 1 tablet (20 mg total) by mouth every evening., Disp: 90 tablet, Rfl: 1   tamsulosin (FLOMAX) 0.4 MG CAPS capsule, Take 0.4 mg by mouth. , Disp: , Rfl:    triamcinolone (NASACORT ALLERGY 24HR)  55 MCG/ACT AERO nasal inhaler, Place 2 sprays into the nose daily., Disp: 60 each, Rfl: 2 No current facility-administered medications for this visit.  Facility-Administered Medications Ordered in Other Visits:    atropine 1 MG/ML injection, , , ,    heparin lock flush 100 UNIT/ML injection, , , ,   Physical exam:  Vitals:   08/17/23 0840  BP: (!) 126/58  Pulse: 76  Resp: 18  Temp: (!) 97 F (36.1 C)  TempSrc: Tympanic  SpO2: 99%  Weight: 143 lb 6.4 oz (65 kg)  Height: 5\' 7"  (  1.702 m)   Physical Exam Cardiovascular:     Rate and Rhythm: Normal rate and regular rhythm.     Heart sounds: Normal heart sounds.  Pulmonary:     Effort: Pulmonary effort is normal.     Breath sounds: Normal breath sounds.  Abdominal:     General: Bowel sounds are normal.     Palpations: Abdomen is soft.  Skin:    General: Skin is warm and dry.  Neurological:     Mental Status: He is alert and oriented to person, place, and time.         Latest Ref Rng & Units 08/17/2023    8:29 AM  CMP  Glucose 70 - 99 mg/dL 93   BUN 8 - 23 mg/dL 17   Creatinine 4.09 - 1.24 mg/dL 8.11   Sodium 914 - 782 mmol/L 135   Potassium 3.5 - 5.1 mmol/L 3.5   Chloride 98 - 111 mmol/L 107   CO2 22 - 32 mmol/L 22   Calcium 8.9 - 10.3 mg/dL 8.0   Total Protein 6.5 - 8.1 g/dL 6.7   Total Bilirubin 0.3 - 1.2 mg/dL 0.6   Alkaline Phos 38 - 126 U/L 71   AST 15 - 41 U/L 25   ALT 0 - 44 U/L 13       Latest Ref Rng & Units 08/17/2023    8:29 AM  CBC  WBC 4.0 - 10.5 K/uL 2.8   Hemoglobin 13.0 - 17.0 g/dL 95.6   Hematocrit 21.3 - 52.0 % 30.2   Platelets 150 - 400 K/uL 75     No images are attached to the encounter.  CT ABDOMINAL MASS BIOPSY  Result Date: 08/11/2023 INDICATION: Liver lesion, retroperitoneal lymph node; history of colorectal cancer EXAM: CT-guided core needle biopsy of retroperitoneal lymph node MEDICATIONS: None. ANESTHESIA/SEDATION: Moderate (conscious) sedation was employed during this procedure. A  total of Versed 2 mg and Fentanyl 100 mcg was administered intravenously. Moderate Sedation Time: 14 minutes. The patient's level of consciousness and vital signs were monitored continuously by radiology nursing throughout the procedure under my direct supervision. FLUOROSCOPY TIME:  N/a COMPLICATIONS: None immediate. PROCEDURE: Informed written consent was obtained from the patient after a thorough discussion of the procedural risks, benefits and alternatives. All questions were addressed. Maximal Sterile Barrier Technique was utilized including caps, mask, sterile gowns, sterile gloves, sterile drape, hand hygiene and skin antiseptic. A timeout was performed prior to the initiation of the procedure. Patient was initially placed supine on the exam table. Ultrasound of the liver was performed to attempt visualization of the small hypermetabolic lesions in the right hepatic lobe previously seen on PET-CT. These lesions were not positively identified by ultrasound, and therefore decision was made to proceed with CT-guided biopsy of the retroperitoneal lymph nodes. The patient was placed prone on the exam table. Limited CT of the abdomen and pelvis was performed for planning purposes. This demonstrated borderline enlarged lymph nodes in the retroperitoneum, compatible with recent hypermetabolic lymphadenopathy seen on recent PET-CT. Skin entry site was marked, and the overlying skin was prepped and draped in the standard sterile fashion. Local analgesia was obtained with 1% lidocaine. Using intermittent CT fluoroscopy, a 17 gauge introducer needle was advanced towards the identified lesion. Subsequently, core needle biopsy was performed using an 18 gauge core biopsy device x6 total passes. Specimens were submitted in formalin to pathology for further handling. Limited postprocedure imaging demonstrated no complicating feature. The patient tolerated the procedure well, and was  transferred to recovery in stable  condition. IMPRESSION: Successful CT-guided core needle biopsy of left retroperitoneal lymph node. Electronically Signed   By: Olive Bass M.D.   On: 08/11/2023 16:06   US Abdomen Limited  Result Date: 08/11/2023 INDICATION: Liver lesion, retroperitoneal lymph node; history of colorectal cancer EXAM: CT-guided core needle biopsy of retroperitoneal lymph node MEDICATIONS: None. ANESTHESIA/SEDATION: Moderate (conscious) sedation was employed during this procedure. A total of Versed 2 mg and Fentanyl 100 mcg was administered intravenously. Moderate Sedation Time: 14 minutes. The patient's level of consciousness and vital signs were monitored continuously by radiology nursing throughout the procedure under my direct supervision. FLUOROSCOPY TIME:  N/a COMPLICATIONS: None immediate. PROCEDURE: Informed written consent was obtained from the patient after a thorough discussion of the procedural risks, benefits and alternatives. All questions were addressed. Maximal Sterile Barrier Technique was utilized including caps, mask, sterile gowns, sterile gloves, sterile drape, hand hygiene and skin antiseptic. A timeout was performed prior to the initiation of the procedure. Patient was initially placed supine on the exam table. Ultrasound of the liver was performed to attempt visualization of the small hypermetabolic lesions in the right hepatic lobe previously seen on PET-CT. These lesions were not positively identified by ultrasound, and therefore decision was made to proceed with CT-guided biopsy of the retroperitoneal lymph nodes. The patient was placed prone on the exam table. Limited CT of the abdomen and pelvis was performed for planning purposes. This demonstrated borderline enlarged lymph nodes in the retroperitoneum, compatible with recent hypermetabolic lymphadenopathy seen on recent PET-CT. Skin entry site was marked, and the overlying skin was prepped and draped in the standard sterile fashion. Local  analgesia was obtained with 1% lidocaine. Using intermittent CT fluoroscopy, a 17 gauge introducer needle was advanced towards the identified lesion. Subsequently, core needle biopsy was performed using an 18 gauge core biopsy device x6 total passes. Specimens were submitted in formalin to pathology for further handling. Limited postprocedure imaging demonstrated no complicating feature. The patient tolerated the procedure well, and was transferred to recovery in stable condition. IMPRESSION: Successful CT-guided core needle biopsy of left retroperitoneal lymph node. Electronically Signed   By: Olive Bass M.D.   On: 08/11/2023 16:06     Assessment and plan- Patient is a 72 y.o. male with metastatic colon cancer with liver and lymph node metastases here to discuss further management  Patient has had disease progression and multiple lines of treatment including FOLFOX bevacizumab, Xeloda irinotecan panitumumab as well as third line Lonsurf Avastin.  He is not a candidate for any clinical trials at [facility] or [facility].  We did repeat liver biopsy and have sent it for NGS testing to see if there are any actionable mutations.  In the meanwhile I would like him to get started with fruquintinib 5 mg daily 3 weeks on and 1 week off until progression or toxicity.  Discussed risks and benefits of recommended including all but not limited to nausea vomiting diarrhea low blood counts, hypertension and skin rash.  Treatment will be given with a palliative intent.  In an international, double-blind, placebo controlled phase III trial (FRESCO-2), 691 patients with heavily pretreated mCRC were randomly assigned 2:1 to receive either fruquintinib (5 mg orally once daily on days 1 through 21 of a 28-day cycle) or placebo until disease progression or intolerable toxicity. At median follow-up of 11 months, relative to placebo, fruquintinib improved PFS (median 3.7 versus 1.8 months, HR 0.32, 95% 0.27-0.39), OS (median 7.4 versus 4.8  months,  HR 0.66, 95% CI 0.55-0.80), and the disease control rate (56 versus 16 percent).  Patient has some baselineThrombocytopenia secondary to [facility] but platelet counts of more than 75 which we will continue to monitor.  ANC remains more than 1.5.  I will repeat labs in 2 weeks in 4 weeks and see him back in 4 weeks   Visit Diagnosis 1. Goals of care, counseling/discussion   2. 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 High risk medication use   4. Drug-induced thrombocytopenia      Dr. Owens Shark, MD, MPH [facility] [phone] 08/17/2023 10:03 AM

## 2023-08-17 NOTE — Progress Notes (Signed)
Clinical Pharmacist Practitioner Clinic [facility]  Telephone:([phone] [phone]  Patient Care Team: [name], MD as PCP - General (Internal Medicine) Benita Gutter, RN as Oncology Nurse Navigator Creig Hines, MD as Consulting Physician (Hematology and Oncology)   Name of the patient: Eric Simon  [id number]  05-22-1951   Date of visit: 08/17/23  HPI: Patient is a 72 y.o. male with progressive metastatic colon cancer. Planned treatment with Kelton Pillar (fruquintinib). Planned start for 08/19/2023.   Reason for Consult: fruquintinib oral chemotherapy education.   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  MEDICATIONS:  Current Outpatient Medications  Medication Sig Dispense Refill   aspirin 81 MG chewable tablet Chew by mouth.     aspirin EC 81 MG tablet Take 81 mg by mouth daily.     azelastine (ASTELIN) 0.1 % nasal spray Place 1 spray into both nostrils daily as needed.     budesonide-formoterol  (SYMBICORT) 80-4.5 MCG/ACT inhaler USE 2 INHALATION BY MOUTH TWICE DAILY 10.2 g 2   cetirizine (ZYRTEC) 10 MG tablet TAKE 1 TABLET BY MOUTH DAILY 90 tablet 0   cyclobenzaprine (FLEXERIL) 10 MG tablet TAKE 1 TABLET(10 MG) BY MOUTH THREE TIMES DAILY AS NEEDED FOR MUSCLE SPASMS 42 tablet 0   dexlansoprazole (DEXILANT) 60 MG capsule Take by mouth.     dexlansoprazole (DEXILANT) 60 MG capsule Take 1 capsule (60 mg total) by mouth every morning. 90 capsule 1   DULoxetine (CYMBALTA) 30 MG capsule Take 1 capsule (30 mg total) by mouth 2 (two) times daily. 60 capsule 1   fruquintinib (FRUZAQLA) 5 MG capsule Take 1 capsule (5 mg total) by mouth daily. Take for 21 days on, then off for 7 days. Repeat every 28 days. 21 capsule 0   lactulose (CHRONULAC) 10 GM/15ML solution Take 15 mLs (10 g total) by mouth 3 (three) times daily as needed for mild constipation. 236 mL 1   lidocaine-prilocaine (EMLA) cream Apply 1 Application topically as needed (pt will put 1 inch of cream over port 1 hourfor each treatment, cover plastic over the cream protect clothes). 30 g 3   lisinopril (ZESTRIL) 5 MG tablet Take 1 tablet (5 mg total) by mouth daily. 90 tablet 1   montelukast (SINGULAIR) 10 MG tablet Take 10 mg by mouth at bedtime.     nicotine (NICODERM CQ - DOSED IN MG/24 HOURS) 21 mg/24hr patch Place onto the skin.     pregabalin (LYRICA) 75 MG capsule TAKE 1  CAPSULE BY MOUTH EVERY MORNING AND TAKE 2 CAPSULES AT BEDTIME 90 capsule 1   simvastatin (ZOCOR) 20 MG tablet Take 1 tablet (20 mg total) by mouth every evening. 90 tablet 1   tamsulosin (FLOMAX) 0.4 MG CAPS capsule Take 0.4 mg by mouth.      triamcinolone (NASACORT ALLERGY 24HR) 55 MCG/ACT AERO nasal inhaler Place 2 sprays into the nose daily. 60 each 2   No current facility-administered medications for this visit.   Facility-Administered Medications Ordered in Other Visits  Medication Dose Route Frequency Provider Last Rate Last Admin   atropine 1 MG/ML  injection            heparin lock flush 100 UNIT/ML injection             VITAL SIGNS: There were no vitals taken for this visit. There were no vitals filed for this visit.  Estimated body mass index is 22.46 kg/m as calculated from the following:   Height as of an earlier encounter on 08/17/23: 5\' 7"  (1.702 m).   Weight as of an earlier encounter on 08/17/23: 65 kg (143 lb 6.4 oz).  LABS: CBC:    Component Value Date/Time   WBC 2.8 (L) 08/17/2023 0829   WBC 4.7 08/11/2023 0903   HGB 10.0 (L) 08/17/2023 0829   HGB 14.2 02/08/2023 1130   HCT 30.2 (L) 08/17/2023 0829   HCT 41.0 02/08/2023 1130   PLT 75 (L) 08/17/2023 0829   PLT 112 (L) 02/08/2023 1130   MCV 98.4 08/17/2023 0829   MCV 95 02/08/2023 1130   MCV 88 08/18/2012 1321   NEUTROABS 2.0 08/17/2023 0829   NEUTROABS 7.4 (H) 02/08/2023 1130   LYMPHSABS 0.4 (L) 08/17/2023 0829   LYMPHSABS 0.4 (L) 02/08/2023 1130   MONOABS 0.4 08/17/2023 0829   EOSABS 0.0 08/17/2023 0829   EOSABS 0.0 02/08/2023 1130   BASOSABS 0.0 08/17/2023 0829   BASOSABS 0.0 02/08/2023 1130   Comprehensive Metabolic Panel:    Component Value Date/Time   NA 135 08/17/2023 0829   NA 137 02/08/2023 1130   NA 139 08/18/2012 1321   K 3.5 08/17/2023 0829   K 3.7 08/18/2012 1321   CL 107 08/17/2023 0829   CL 104 08/18/2012 1321   CO2 22 08/17/2023 0829   CO2 27 08/18/2012 1321   BUN 17 08/17/2023 0829   BUN 31 (H) 02/08/2023 1130   BUN 18 08/18/2012 1321   CREATININE 0.88 08/17/2023 0829   CREATININE 0.82 08/18/2012 1321   GLUCOSE 93 08/17/2023 0829   GLUCOSE 68 08/18/2012 1321   CALCIUM 8.0 (L) 08/17/2023 0829   CALCIUM 9.0 08/18/2012 1321   AST 25 08/17/2023 0829   ALT 13 08/17/2023 0829   ALT 51 08/18/2012 1321   ALKPHOS 71 08/17/2023 0829   ALKPHOS 88 08/18/2012 1321   BILITOT 0.6 08/17/2023 0829   PROT 6.7 08/17/2023 0829   PROT 6.9 02/08/2023 1130   PROT 8.0 08/18/2012 1321   ALBUMIN 3.4 (L) 08/17/2023 0829   ALBUMIN 4.2 02/08/2023  1130   ALBUMIN 4.1 08/18/2012 1321     Present during today's visit: Patient only  Start plan: 08/19/2023   Patient Education I spoke with patient for overview of new oral chemotherapy medication: fruquintinib   Administration: Counseled patient on administration, dosing, side effects, monitoring, drug-food interactions, safe handling, storage, and disposal. Patient will take 1 capsule (5 mg total) by mouth daily. Take for 21 days on, then off for 7 days. Repeat every 28 days.  Side Effects: Side effects include but not limited to: hand-foot syndrome, fatigue, hypertension, decreased platelet count.   Hypertension; patient report having blood pressure cuff at home. Reviewed signs and symptoms of hypertension Hand-foot syndrome; provided patient with Udderly Smooth Extra Care 20  Drug-drug Interactions (DDI): No current DDIs with fruquintinib   Adherence: After discussion with patient no patient barriers to medication adherence identified.  Reviewed with patient importance of keeping a medication schedule and plan for any missed doses.  Mr. Khoury voiced understanding and appreciation. All questions answered. Medication handout provided.  Provided patient with Oral Chemotherapy Navigation Clinic phone number. Patient knows to call the office with questions or concerns. Oral Chemotherapy Navigation Clinic will continue to follow.  Patient expressed understanding and was in agreement with this plan. He also understands that He can call clinic at any time with any questions, concerns, or complaints.   Medication Access Issues: Romeo Apple spoke with patient in clinical today and set up medication delivery from [facility]Special[facility]  Follow-up plan: Return to clinic as scheduled   Thank you for allowing me to participate in the care of this patient.   Time Total: 15 minutes  Visit consisted of counseling and education on dealing with issues of symptom management in the setting  of serious and potentially life-threatening illness.Greater than 50%  of this time was spent counseling and coordinating care related to the above assessment and plan.  Signed by: Remi Haggard, PharmD, BCPS, Nolon Bussing, CPP Hematology/Oncology Clinical Pharmacist Practitioner [facility] [phone]  08/17/2023 11:55 AM

## 2023-08-17 NOTE — Telephone Encounter (Signed)
Patient successfully OnBoarded and drug education provided by pharmacist. Medication scheduled to be shipped on Thursday, 08/18/23 for delivery on Friday, 08/19/23 from [facility] to patient's address. Patient also knows to call me at [phone] with any questions or concerns regarding receiving medication or if there is any unexpected change in co-pay.    Ardeen Fillers, CPhT Oncology Pharmacy Patient Advocate  [facility]  [phone] (phone) [phone] (fax) 08/17/2023 9:17 AM

## 2023-08-18 ENCOUNTER — Other Ambulatory Visit (HOSPITAL_COMMUNITY): Payer: Self-pay

## 2023-08-19 ENCOUNTER — Inpatient Hospital Stay: Payer: Medicare HMO

## 2023-08-22 ENCOUNTER — Other Ambulatory Visit (HOSPITAL_COMMUNITY): Payer: Self-pay

## 2023-08-22 ENCOUNTER — Other Ambulatory Visit: Payer: Self-pay

## 2023-08-27 DIAGNOSIS — S0990XA Unspecified injury of head, initial encounter: Secondary | ICD-10-CM | POA: Diagnosis not present

## 2023-08-27 DIAGNOSIS — C772 Secondary and unspecified malignant neoplasm of intra-abdominal lymph nodes: Secondary | ICD-10-CM | POA: Diagnosis not present

## 2023-08-27 DIAGNOSIS — S59912A Unspecified injury of left forearm, initial encounter: Secondary | ICD-10-CM | POA: Diagnosis not present

## 2023-08-27 DIAGNOSIS — C787 Secondary malignant neoplasm of liver and intrahepatic bile duct: Secondary | ICD-10-CM | POA: Diagnosis not present

## 2023-08-27 DIAGNOSIS — S2241XA Multiple fractures of ribs, right side, initial encounter for closed fracture: Secondary | ICD-10-CM | POA: Diagnosis not present

## 2023-08-27 DIAGNOSIS — S3992XA Unspecified injury of lower back, initial encounter: Secondary | ICD-10-CM | POA: Diagnosis not present

## 2023-08-27 DIAGNOSIS — C189 Malignant neoplasm of colon, unspecified: Secondary | ICD-10-CM | POA: Diagnosis not present

## 2023-08-27 DIAGNOSIS — G40901 Epilepsy, unspecified, not intractable, with status epilepticus: Secondary | ICD-10-CM | POA: Diagnosis not present

## 2023-08-27 DIAGNOSIS — I10 Essential (primary) hypertension: Secondary | ICD-10-CM | POA: Diagnosis not present

## 2023-08-27 DIAGNOSIS — S5011XA Contusion of right forearm, initial encounter: Secondary | ICD-10-CM | POA: Diagnosis not present

## 2023-08-27 DIAGNOSIS — S5012XA Contusion of left forearm, initial encounter: Secondary | ICD-10-CM | POA: Diagnosis not present

## 2023-08-27 DIAGNOSIS — D61818 Other pancytopenia: Secondary | ICD-10-CM | POA: Diagnosis not present

## 2023-08-27 DIAGNOSIS — S199XXA Unspecified injury of neck, initial encounter: Secondary | ICD-10-CM | POA: Diagnosis not present

## 2023-08-27 DIAGNOSIS — R918 Other nonspecific abnormal finding of lung field: Secondary | ICD-10-CM | POA: Diagnosis not present

## 2023-08-27 DIAGNOSIS — S3991XA Unspecified injury of abdomen, initial encounter: Secondary | ICD-10-CM | POA: Diagnosis not present

## 2023-08-27 DIAGNOSIS — Z85038 Personal history of other malignant neoplasm of large intestine: Secondary | ICD-10-CM | POA: Diagnosis not present

## 2023-08-27 DIAGNOSIS — Z4682 Encounter for fitting and adjustment of non-vascular catheter: Secondary | ICD-10-CM | POA: Diagnosis not present

## 2023-08-27 DIAGNOSIS — I671 Cerebral aneurysm, nonruptured: Secondary | ICD-10-CM | POA: Diagnosis not present

## 2023-08-27 DIAGNOSIS — S3993XA Unspecified injury of pelvis, initial encounter: Secondary | ICD-10-CM | POA: Diagnosis not present

## 2023-08-27 DIAGNOSIS — D61810 Antineoplastic chemotherapy induced pancytopenia: Secondary | ICD-10-CM | POA: Diagnosis not present

## 2023-08-27 DIAGNOSIS — W1839XA Other fall on same level, initial encounter: Secondary | ICD-10-CM | POA: Diagnosis not present

## 2023-08-27 DIAGNOSIS — E440 Moderate protein-calorie malnutrition: Secondary | ICD-10-CM | POA: Diagnosis not present

## 2023-08-27 DIAGNOSIS — S59911A Unspecified injury of right forearm, initial encounter: Secondary | ICD-10-CM | POA: Diagnosis not present

## 2023-08-27 DIAGNOSIS — R0689 Other abnormalities of breathing: Secondary | ICD-10-CM | POA: Diagnosis not present

## 2023-08-27 DIAGNOSIS — E7220 Disorder of urea cycle metabolism, unspecified: Secondary | ICD-10-CM | POA: Diagnosis not present

## 2023-08-27 DIAGNOSIS — S0012XA Contusion of left eyelid and periocular area, initial encounter: Secondary | ICD-10-CM | POA: Diagnosis not present

## 2023-08-27 DIAGNOSIS — R569 Unspecified convulsions: Secondary | ICD-10-CM | POA: Diagnosis not present

## 2023-08-27 DIAGNOSIS — M4802 Spinal stenosis, cervical region: Secondary | ICD-10-CM | POA: Diagnosis not present

## 2023-08-27 DIAGNOSIS — Y33XXXA Other specified events, undetermined intent, initial encounter: Secondary | ICD-10-CM | POA: Diagnosis not present

## 2023-08-27 DIAGNOSIS — G9389 Other specified disorders of brain: Secondary | ICD-10-CM | POA: Diagnosis not present

## 2023-08-27 DIAGNOSIS — Z66 Do not resuscitate: Secondary | ICD-10-CM | POA: Diagnosis not present

## 2023-08-27 DIAGNOSIS — E785 Hyperlipidemia, unspecified: Secondary | ICD-10-CM | POA: Diagnosis not present

## 2023-08-27 DIAGNOSIS — J449 Chronic obstructive pulmonary disease, unspecified: Secondary | ICD-10-CM | POA: Diagnosis not present

## 2023-08-27 DIAGNOSIS — I6783 Posterior reversible encephalopathy syndrome: Secondary | ICD-10-CM | POA: Diagnosis not present

## 2023-08-27 DIAGNOSIS — R41841 Cognitive communication deficit: Secondary | ICD-10-CM | POA: Diagnosis not present

## 2023-08-27 DIAGNOSIS — G9349 Other encephalopathy: Secondary | ICD-10-CM | POA: Diagnosis not present

## 2023-08-27 DIAGNOSIS — T1490XA Injury, unspecified, initial encounter: Secondary | ICD-10-CM | POA: Diagnosis not present

## 2023-08-27 DIAGNOSIS — D696 Thrombocytopenia, unspecified: Secondary | ICD-10-CM | POA: Diagnosis not present

## 2023-08-27 DIAGNOSIS — S299XXA Unspecified injury of thorax, initial encounter: Secondary | ICD-10-CM | POA: Diagnosis not present

## 2023-08-27 DIAGNOSIS — Z6821 Body mass index (BMI) 21.0-21.9, adult: Secondary | ICD-10-CM | POA: Diagnosis not present

## 2023-08-27 DIAGNOSIS — W19XXXA Unspecified fall, initial encounter: Secondary | ICD-10-CM | POA: Diagnosis not present

## 2023-08-27 DIAGNOSIS — R0902 Hypoxemia: Secondary | ICD-10-CM | POA: Diagnosis not present

## 2023-08-27 DIAGNOSIS — R040 Epistaxis: Secondary | ICD-10-CM | POA: Diagnosis not present

## 2023-08-28 DIAGNOSIS — C772 Secondary and unspecified malignant neoplasm of intra-abdominal lymph nodes: Secondary | ICD-10-CM | POA: Diagnosis not present

## 2023-08-28 DIAGNOSIS — I10 Essential (primary) hypertension: Secondary | ICD-10-CM | POA: Diagnosis not present

## 2023-08-28 DIAGNOSIS — W19XXXA Unspecified fall, initial encounter: Secondary | ICD-10-CM | POA: Diagnosis not present

## 2023-08-28 DIAGNOSIS — J449 Chronic obstructive pulmonary disease, unspecified: Secondary | ICD-10-CM | POA: Diagnosis not present

## 2023-08-28 DIAGNOSIS — S2241XA Multiple fractures of ribs, right side, initial encounter for closed fracture: Secondary | ICD-10-CM | POA: Diagnosis not present

## 2023-08-28 DIAGNOSIS — R569 Unspecified convulsions: Secondary | ICD-10-CM | POA: Diagnosis not present

## 2023-08-28 DIAGNOSIS — I6783 Posterior reversible encephalopathy syndrome: Secondary | ICD-10-CM | POA: Diagnosis not present

## 2023-08-28 DIAGNOSIS — W1839XA Other fall on same level, initial encounter: Secondary | ICD-10-CM | POA: Diagnosis not present

## 2023-08-28 DIAGNOSIS — C189 Malignant neoplasm of colon, unspecified: Secondary | ICD-10-CM | POA: Diagnosis not present

## 2023-08-28 DIAGNOSIS — D696 Thrombocytopenia, unspecified: Secondary | ICD-10-CM | POA: Diagnosis not present

## 2023-08-28 DIAGNOSIS — C787 Secondary malignant neoplasm of liver and intrahepatic bile duct: Secondary | ICD-10-CM | POA: Diagnosis not present

## 2023-08-29 DIAGNOSIS — C772 Secondary and unspecified malignant neoplasm of intra-abdominal lymph nodes: Secondary | ICD-10-CM | POA: Diagnosis not present

## 2023-08-29 DIAGNOSIS — J449 Chronic obstructive pulmonary disease, unspecified: Secondary | ICD-10-CM | POA: Diagnosis not present

## 2023-08-29 DIAGNOSIS — W19XXXA Unspecified fall, initial encounter: Secondary | ICD-10-CM | POA: Diagnosis not present

## 2023-08-29 DIAGNOSIS — S2241XA Multiple fractures of ribs, right side, initial encounter for closed fracture: Secondary | ICD-10-CM | POA: Diagnosis not present

## 2023-08-29 DIAGNOSIS — I10 Essential (primary) hypertension: Secondary | ICD-10-CM | POA: Diagnosis not present

## 2023-08-29 DIAGNOSIS — W1839XA Other fall on same level, initial encounter: Secondary | ICD-10-CM | POA: Diagnosis not present

## 2023-08-29 DIAGNOSIS — C787 Secondary malignant neoplasm of liver and intrahepatic bile duct: Secondary | ICD-10-CM | POA: Diagnosis not present

## 2023-08-29 DIAGNOSIS — I6783 Posterior reversible encephalopathy syndrome: Secondary | ICD-10-CM | POA: Diagnosis not present

## 2023-08-29 DIAGNOSIS — C189 Malignant neoplasm of colon, unspecified: Secondary | ICD-10-CM | POA: Diagnosis not present

## 2023-08-29 DIAGNOSIS — D696 Thrombocytopenia, unspecified: Secondary | ICD-10-CM | POA: Diagnosis not present

## 2023-08-29 DIAGNOSIS — T1490XA Injury, unspecified, initial encounter: Secondary | ICD-10-CM | POA: Diagnosis not present

## 2023-08-29 DIAGNOSIS — R569 Unspecified convulsions: Secondary | ICD-10-CM | POA: Diagnosis not present

## 2023-08-30 DIAGNOSIS — C189 Malignant neoplasm of colon, unspecified: Secondary | ICD-10-CM | POA: Diagnosis not present

## 2023-08-30 DIAGNOSIS — I10 Essential (primary) hypertension: Secondary | ICD-10-CM | POA: Diagnosis not present

## 2023-08-30 DIAGNOSIS — W19XXXA Unspecified fall, initial encounter: Secondary | ICD-10-CM | POA: Diagnosis not present

## 2023-08-30 DIAGNOSIS — R569 Unspecified convulsions: Secondary | ICD-10-CM | POA: Diagnosis not present

## 2023-08-30 DIAGNOSIS — S2241XA Multiple fractures of ribs, right side, initial encounter for closed fracture: Secondary | ICD-10-CM | POA: Diagnosis not present

## 2023-08-30 DIAGNOSIS — D696 Thrombocytopenia, unspecified: Secondary | ICD-10-CM | POA: Diagnosis not present

## 2023-08-30 DIAGNOSIS — J449 Chronic obstructive pulmonary disease, unspecified: Secondary | ICD-10-CM | POA: Diagnosis not present

## 2023-08-30 DIAGNOSIS — W1839XA Other fall on same level, initial encounter: Secondary | ICD-10-CM | POA: Diagnosis not present

## 2023-08-30 DIAGNOSIS — C787 Secondary malignant neoplasm of liver and intrahepatic bile duct: Secondary | ICD-10-CM | POA: Diagnosis not present

## 2023-08-30 DIAGNOSIS — I6783 Posterior reversible encephalopathy syndrome: Secondary | ICD-10-CM | POA: Diagnosis not present

## 2023-08-31 ENCOUNTER — Ambulatory Visit: Payer: Medicare HMO | Admitting: Oncology

## 2023-08-31 ENCOUNTER — Other Ambulatory Visit: Payer: Medicare HMO

## 2023-08-31 ENCOUNTER — Telehealth: Payer: Self-pay | Admitting: *Deleted

## 2023-08-31 ENCOUNTER — Ambulatory Visit: Payer: Medicare HMO

## 2023-08-31 DIAGNOSIS — C787 Secondary malignant neoplasm of liver and intrahepatic bile duct: Secondary | ICD-10-CM | POA: Diagnosis not present

## 2023-08-31 DIAGNOSIS — W1839XA Other fall on same level, initial encounter: Secondary | ICD-10-CM | POA: Diagnosis not present

## 2023-08-31 DIAGNOSIS — W19XXXA Unspecified fall, initial encounter: Secondary | ICD-10-CM | POA: Diagnosis not present

## 2023-08-31 DIAGNOSIS — C189 Malignant neoplasm of colon, unspecified: Secondary | ICD-10-CM | POA: Diagnosis not present

## 2023-08-31 DIAGNOSIS — D696 Thrombocytopenia, unspecified: Secondary | ICD-10-CM | POA: Diagnosis not present

## 2023-08-31 DIAGNOSIS — J449 Chronic obstructive pulmonary disease, unspecified: Secondary | ICD-10-CM | POA: Diagnosis not present

## 2023-08-31 DIAGNOSIS — R569 Unspecified convulsions: Secondary | ICD-10-CM | POA: Diagnosis not present

## 2023-08-31 DIAGNOSIS — I10 Essential (primary) hypertension: Secondary | ICD-10-CM | POA: Diagnosis not present

## 2023-08-31 DIAGNOSIS — S2241XA Multiple fractures of ribs, right side, initial encounter for closed fracture: Secondary | ICD-10-CM | POA: Diagnosis not present

## 2023-08-31 DIAGNOSIS — I6783 Posterior reversible encephalopathy syndrome: Secondary | ICD-10-CM | POA: Diagnosis not present

## 2023-08-31 NOTE — Telephone Encounter (Signed)
Jae Dire from Neuro ICU at [facility] called asking for return call as she wants to update doctor on patient condition. She states that he has an appointment with doctor at [facility] on Friday (lab appointment is scheduled). Please return her call [phone], patient is in room 7B21

## 2023-09-01 ENCOUNTER — Other Ambulatory Visit (HOSPITAL_COMMUNITY): Payer: Self-pay

## 2023-09-01 DIAGNOSIS — C189 Malignant neoplasm of colon, unspecified: Secondary | ICD-10-CM | POA: Diagnosis not present

## 2023-09-01 DIAGNOSIS — S2241XA Multiple fractures of ribs, right side, initial encounter for closed fracture: Secondary | ICD-10-CM | POA: Diagnosis not present

## 2023-09-01 DIAGNOSIS — C787 Secondary malignant neoplasm of liver and intrahepatic bile duct: Secondary | ICD-10-CM | POA: Diagnosis not present

## 2023-09-01 DIAGNOSIS — W1839XA Other fall on same level, initial encounter: Secondary | ICD-10-CM | POA: Diagnosis not present

## 2023-09-01 DIAGNOSIS — R41841 Cognitive communication deficit: Secondary | ICD-10-CM | POA: Diagnosis not present

## 2023-09-01 DIAGNOSIS — R569 Unspecified convulsions: Secondary | ICD-10-CM | POA: Diagnosis not present

## 2023-09-02 ENCOUNTER — Inpatient Hospital Stay: Payer: Medicare HMO

## 2023-09-02 ENCOUNTER — Encounter: Payer: Self-pay | Admitting: Oncology

## 2023-09-02 ENCOUNTER — Other Ambulatory Visit (HOSPITAL_COMMUNITY): Payer: Self-pay

## 2023-09-02 DIAGNOSIS — I6783 Posterior reversible encephalopathy syndrome: Secondary | ICD-10-CM | POA: Diagnosis not present

## 2023-09-02 DIAGNOSIS — R41841 Cognitive communication deficit: Secondary | ICD-10-CM | POA: Diagnosis not present

## 2023-09-02 DIAGNOSIS — C189 Malignant neoplasm of colon, unspecified: Secondary | ICD-10-CM | POA: Diagnosis not present

## 2023-09-02 DIAGNOSIS — G40901 Epilepsy, unspecified, not intractable, with status epilepticus: Secondary | ICD-10-CM | POA: Diagnosis not present

## 2023-09-02 DIAGNOSIS — C787 Secondary malignant neoplasm of liver and intrahepatic bile duct: Secondary | ICD-10-CM | POA: Diagnosis not present

## 2023-09-03 DIAGNOSIS — E785 Hyperlipidemia, unspecified: Secondary | ICD-10-CM | POA: Diagnosis not present

## 2023-09-03 DIAGNOSIS — C787 Secondary malignant neoplasm of liver and intrahepatic bile duct: Secondary | ICD-10-CM | POA: Diagnosis not present

## 2023-09-03 DIAGNOSIS — R569 Unspecified convulsions: Secondary | ICD-10-CM | POA: Diagnosis not present

## 2023-09-03 DIAGNOSIS — C189 Malignant neoplasm of colon, unspecified: Secondary | ICD-10-CM | POA: Diagnosis not present

## 2023-09-03 DIAGNOSIS — I6783 Posterior reversible encephalopathy syndrome: Secondary | ICD-10-CM | POA: Diagnosis not present

## 2023-09-04 DIAGNOSIS — I6783 Posterior reversible encephalopathy syndrome: Secondary | ICD-10-CM | POA: Diagnosis not present

## 2023-09-04 DIAGNOSIS — E785 Hyperlipidemia, unspecified: Secondary | ICD-10-CM | POA: Diagnosis not present

## 2023-09-04 DIAGNOSIS — C189 Malignant neoplasm of colon, unspecified: Secondary | ICD-10-CM | POA: Diagnosis not present

## 2023-09-04 DIAGNOSIS — C787 Secondary malignant neoplasm of liver and intrahepatic bile duct: Secondary | ICD-10-CM | POA: Diagnosis not present

## 2023-09-04 DIAGNOSIS — R569 Unspecified convulsions: Secondary | ICD-10-CM | POA: Diagnosis not present

## 2023-09-05 ENCOUNTER — Telehealth: Payer: Self-pay | Admitting: *Deleted

## 2023-09-05 DIAGNOSIS — C787 Secondary malignant neoplasm of liver and intrahepatic bile duct: Secondary | ICD-10-CM | POA: Diagnosis not present

## 2023-09-05 DIAGNOSIS — C189 Malignant neoplasm of colon, unspecified: Secondary | ICD-10-CM | POA: Diagnosis not present

## 2023-09-05 DIAGNOSIS — I6783 Posterior reversible encephalopathy syndrome: Secondary | ICD-10-CM | POA: Diagnosis not present

## 2023-09-05 DIAGNOSIS — R569 Unspecified convulsions: Secondary | ICD-10-CM | POA: Diagnosis not present

## 2023-09-05 DIAGNOSIS — E785 Hyperlipidemia, unspecified: Secondary | ICD-10-CM | POA: Diagnosis not present

## 2023-09-05 NOTE — Telephone Encounter (Signed)
The plan is for him to go home and [facility] wants him to come on 9/25 and confirmed that he has appts for that day. No need to call him back if you do not want further testing done

## 2023-09-05 NOTE — Telephone Encounter (Signed)
Is he going home? No other testing to be done from my end at this time. If he does not wish to come on 9/25 we can move it to next week

## 2023-09-05 NOTE — Telephone Encounter (Signed)
Patient is too be discharged from hospital today unless Dr Smith Robert thinks other testing needs to be done first. He states that patient Judi Cong have a Brain met n MRI, but is currently asymptomatic and he wants to be sure that patient keeps his appointment 9/25 as scheduled. He said to call if any problems or questions or concerns. [phone]

## 2023-09-06 ENCOUNTER — Telehealth: Payer: Self-pay | Admitting: Internal Medicine

## 2023-09-06 ENCOUNTER — Encounter: Payer: Self-pay | Admitting: Oncology

## 2023-09-06 NOTE — Telephone Encounter (Signed)
Pot discharge

## 2023-09-06 NOTE — Telephone Encounter (Signed)
Was supposed to say "hospital discharge appt". But entered in error & pt is scheduled

## 2023-09-07 ENCOUNTER — Other Ambulatory Visit: Payer: Self-pay

## 2023-09-07 ENCOUNTER — Inpatient Hospital Stay: Payer: Medicare HMO

## 2023-09-07 ENCOUNTER — Inpatient Hospital Stay: Payer: Medicare HMO | Admitting: Oncology

## 2023-09-08 ENCOUNTER — Other Ambulatory Visit (HOSPITAL_COMMUNITY): Payer: Self-pay

## 2023-09-13 ENCOUNTER — Other Ambulatory Visit (HOSPITAL_COMMUNITY): Payer: Self-pay

## 2023-09-14 ENCOUNTER — Other Ambulatory Visit: Payer: Self-pay

## 2023-09-14 ENCOUNTER — Ambulatory Visit: Payer: Medicare HMO

## 2023-09-14 ENCOUNTER — Other Ambulatory Visit: Payer: Medicare HMO

## 2023-09-16 ENCOUNTER — Encounter: Payer: Self-pay | Admitting: Oncology

## 2023-09-16 ENCOUNTER — Inpatient Hospital Stay (HOSPITAL_BASED_OUTPATIENT_CLINIC_OR_DEPARTMENT_OTHER): Payer: Medicare HMO | Admitting: Oncology

## 2023-09-16 ENCOUNTER — Other Ambulatory Visit: Payer: Self-pay | Admitting: *Deleted

## 2023-09-16 ENCOUNTER — Ambulatory Visit: Payer: Medicare HMO

## 2023-09-16 ENCOUNTER — Inpatient Hospital Stay (HOSPITAL_BASED_OUTPATIENT_CLINIC_OR_DEPARTMENT_OTHER): Payer: Medicare HMO | Admitting: Hospice and Palliative Medicine

## 2023-09-16 ENCOUNTER — Inpatient Hospital Stay: Payer: Medicare HMO | Attending: Oncology

## 2023-09-16 VITALS — BP 118/78 | HR 107 | Temp 94.9°F | Resp 17 | Ht 67.0 in | Wt 119.2 lb

## 2023-09-16 DIAGNOSIS — C189 Malignant neoplasm of colon, unspecified: Secondary | ICD-10-CM

## 2023-09-16 DIAGNOSIS — C772 Secondary and unspecified malignant neoplasm of intra-abdominal lymph nodes: Secondary | ICD-10-CM

## 2023-09-16 DIAGNOSIS — C787 Secondary malignant neoplasm of liver and intrahepatic bile duct: Secondary | ICD-10-CM | POA: Diagnosis not present

## 2023-09-16 DIAGNOSIS — G936 Cerebral edema: Secondary | ICD-10-CM | POA: Diagnosis not present

## 2023-09-16 DIAGNOSIS — Z7189 Other specified counseling: Secondary | ICD-10-CM

## 2023-09-16 DIAGNOSIS — G893 Neoplasm related pain (acute) (chronic): Secondary | ICD-10-CM | POA: Diagnosis not present

## 2023-09-16 DIAGNOSIS — C187 Malignant neoplasm of sigmoid colon: Secondary | ICD-10-CM | POA: Insufficient documentation

## 2023-09-16 DIAGNOSIS — Z515 Encounter for palliative care: Secondary | ICD-10-CM | POA: Diagnosis not present

## 2023-09-16 DIAGNOSIS — F17210 Nicotine dependence, cigarettes, uncomplicated: Secondary | ICD-10-CM | POA: Diagnosis not present

## 2023-09-16 LAB — COMPREHENSIVE METABOLIC PANEL
ALT: 17 U/L (ref 0–44)
AST: 24 U/L (ref 15–41)
Albumin: 3.6 g/dL (ref 3.5–5.0)
Alkaline Phosphatase: 92 U/L (ref 38–126)
Anion gap: 15 (ref 5–15)
BUN: 24 mg/dL — ABNORMAL HIGH (ref 8–23)
CO2: 20 mmol/L — ABNORMAL LOW (ref 22–32)
Calcium: 8.8 mg/dL — ABNORMAL LOW (ref 8.9–10.3)
Chloride: 101 mmol/L (ref 98–111)
Creatinine, Ser: 0.82 mg/dL (ref 0.61–1.24)
GFR, Estimated: >60 mL/min (ref >60)
Glucose, Bld: 104 mg/dL — ABNORMAL HIGH (ref 70–99)
Potassium: 3 mmol/L — ABNORMAL LOW (ref 3.5–5.1)
Sodium: 136 mmol/L (ref 135–145)
Total Bilirubin: 1.3 mg/dL — ABNORMAL HIGH (ref 0.3–1.2)
Total Protein: 7.5 g/dL (ref 6.5–8.1)

## 2023-09-16 LAB — CBC WITH DIFFERENTIAL/PLATELET
Abs Immature Granulocytes: 0.01 10*3/uL (ref 0.00–0.07)
Basophils Absolute: 0 10*3/uL (ref 0.0–0.1)
Basophils Relative: 0 %
Eosinophils Absolute: 0.1 10*3/uL (ref 0.0–0.5)
Eosinophils Relative: 3 %
HCT: 38.5 % — ABNORMAL LOW (ref 39.0–52.0)
Hemoglobin: 12.5 g/dL — ABNORMAL LOW (ref 13.0–17.0)
Immature Granulocytes: 0 %
Lymphocytes Relative: 18 %
Lymphs Abs: 0.5 10*3/uL — ABNORMAL LOW (ref 0.7–4.0)
MCH: 31.1 pg (ref 26.0–34.0)
MCHC: 32.5 g/dL (ref 30.0–36.0)
MCV: 95.8 fL (ref 80.0–100.0)
Monocytes Absolute: 0.5 10*3/uL (ref 0.1–1.0)
Monocytes Relative: 16 %
Neutro Abs: 1.8 10*3/uL (ref 1.7–7.7)
Neutrophils Relative %: 63 %
Platelets: 123 10*3/uL — ABNORMAL LOW (ref 150–400)
RBC: 4.02 MIL/uL — ABNORMAL LOW (ref 4.22–5.81)
RDW: 16.2 % — ABNORMAL HIGH (ref 11.5–15.5)
WBC: 2.9 10*3/uL — ABNORMAL LOW (ref 4.0–10.5)
nRBC: 0 % (ref 0.0–0.2)

## 2023-09-16 MED ORDER — HYDROCODONE-ACETAMINOPHEN 5-325 MG PO TABS: 1.0000 | ORAL_TABLET | ORAL | 0 refills | Status: DC | PRN

## 2023-09-16 MED ORDER — OXYCODONE HCL 5 MG PO TABS
5.0000 mg | ORAL_TABLET | ORAL | 0 refills | Status: DC | PRN
Start: 2023-09-16 — End: 2023-11-25

## 2023-09-16 NOTE — Progress Notes (Signed)
Palliative Medicine [facility]  Telephone:([phone] [phone]   Name: Eric Simon Date: 09/16/2023 MRN: [id number]  DOB: [birth year]  Patient Care Team: [name], MD as PCP - General (Internal Medicine) Benita Gutter, RN as Oncology Nurse Navigator Creig Hines, MD as Consulting Physician (Hematology and Oncology)    REASON FOR CONSULTATION: Eric Simon is a 72 y.o. 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   He was referred to palliative care to discuss goals and manage ongoing symptoms..   SOCIAL HISTORY:     reports that he has been smoking cigarettes. He has a 20 pack-year smoking history. He has never used smokeless tobacco. He reports that he does not drink alcohol and does not use drugs.   Patient is divorced.  He has no children.  He lives at home and rents a room to a friend.  Patient is originally from [place] but has lived here for 30 years.  He formally worked in [employer].    ADVANCE DIRECTIVES:  Living will but not on file  CODE STATUS: DNR/DNI (MOST Form completed on 09/24/2019, DNR order signed 09/16/23)  PAST MEDICAL HISTORY: Past Medical History:  Diagnosis Date   Colon cancer (HCC)    COPD (chronic obstructive pulmonary disease) (HCC)    Hyperlipidemia     PAST SURGICAL HISTORY:  Past Surgical History:  Procedure Laterality Date   COLONOSCOPY WITH PROPOFOL N/A 06/26/2019   Procedure: COLONOSCOPY WITH PROPOFOL;  Surgeon: Wyline Mood, MD;  Location: [facility] ENDOSCOPY;  Service: Gastroenterology;  Laterality: N/A;   ESOPHAGOGASTRODUODENOSCOPY (EGD) WITH PROPOFOL N/A 01/12/2023   Procedure: ESOPHAGOGASTRODUODENOSCOPY (EGD) WITH PROPOFOL;  Surgeon: Wyline Mood, MD;  Location: [facility] ENDOSCOPY;  Service: Gastroenterology;  Laterality: N/A;   FLEXIBLE  SIGMOIDOSCOPY N/A 05/07/2021   Procedure: FLEXIBLE SIGMOIDOSCOPY;  Surgeon: Wyline Mood, MD;  Location: [facility] ENDOSCOPY;  Service: Gastroenterology;  Laterality: N/A;   PORTA CATH INSERTION N/A 06/25/2019   Procedure: PORTA CATH INSERTION;  Surgeon: Annice Needy, MD;  Location: [facility] INVASIVE CV LAB;  Service: Cardiovascular;  Laterality: N/A;   PORTA CATH INSERTION Left 08/11/2020   Procedure: PORTA CATH INSERTION;  Surgeon: Annice Needy, MD;  Location: [facility] INVASIVE CV LAB;  Service: Cardiovascular;  Laterality: Left;   PORTA CATH REMOVAL Right 08/11/2020   Procedure: PORTA CATH REMOVAL;  Surgeon: Annice Needy, MD;  Location: [facility] INVASIVE CV LAB;  Service: Cardiovascular;  Laterality: Right;    HEMATOLOGY/ONCOLOGY HISTORY:  Oncology History  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)  06/19/2019 Cancer Staging   Staging form: Colon and Rectum, AJCC 8th Edition - Clinical stage from 06/19/2019: Stage Unknown (cTX, cNX, pM1) - Signed by Creig Hines, MD on 06/20/2019   06/20/2019 Initial Diagnosis   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  MEDICATIONS:  Current Outpatient Medications  Medication Sig Dispense  Refill   HYDROcodone-acetaminophen (NORCO) 5-325 MG tablet Take 1 tablet by mouth every 4 (four) hours as needed for moderate pain. 30 tablet 0   aspirin 81 MG chewable tablet Chew by mouth.     azelastine (ASTELIN) 0.1 % nasal spray Place 1 spray into both nostrils daily as needed. (Patient not taking: Reported on  09/16/2023)     budesonide-formoterol (SYMBICORT) 80-4.5 MCG/ACT inhaler USE 2 INHALATION BY MOUTH TWICE DAILY (Patient not taking: Reported on 09/16/2023) 10.2 g 2   cetirizine (ZYRTEC) 10 MG tablet TAKE 1 TABLET BY MOUTH DAILY 90 tablet 0   cyclobenzaprine (FLEXERIL) 10 MG tablet TAKE 1 TABLET(10 MG) BY MOUTH THREE TIMES DAILY AS NEEDED FOR MUSCLE SPASMS 42 tablet 0   dexlansoprazole (DEXILANT) 60 MG capsule Take 1 capsule (60 mg total) by mouth every morning. 90 capsule 1   DULoxetine (CYMBALTA) 30 MG capsule Take 1 capsule (30 mg total) by mouth 2 (two) times daily. 60 capsule 1   lactulose (CHRONULAC) 10 GM/15ML solution Take 15 mLs (10 g total) by mouth 3 (three) times daily as needed for mild constipation. (Patient not taking: Reported on 09/16/2023) 236 mL 1   lidocaine-prilocaine (EMLA) cream Apply 1 Application topically as needed (pt will put 1 inch of cream over port 1 hourfor each treatment, cover plastic over the cream protect clothes). 30 g 3   lisinopril (ZESTRIL) 5 MG tablet Take 1 tablet (5 mg total) by mouth daily. 90 tablet 1   montelukast (SINGULAIR) 10 MG tablet Take 10 mg by mouth at bedtime. (Patient not taking: Reported on 09/16/2023)     nicotine (NICODERM CQ - DOSED IN MG/24 HOURS) 21 mg/24hr patch Place onto the skin. (Patient not taking: Reported on 09/16/2023)     pregabalin (LYRICA) 75 MG capsule TAKE 1 CAPSULE BY MOUTH EVERY MORNING AND TAKE 2 CAPSULES AT BEDTIME 90 capsule 1   simvastatin (ZOCOR) 20 MG tablet Take 1 tablet (20 mg total) by mouth every evening. 90 tablet 1   tamsulosin (FLOMAX) 0.4 MG CAPS capsule Take 0.4 mg by mouth.      triamcinolone (NASACORT ALLERGY 24HR) 55 MCG/ACT AERO nasal inhaler Place 2 sprays into the nose daily. (Patient not taking: Reported on 09/16/2023) 60 each 2   No current facility-administered medications for this visit.   Facility-Administered Medications Ordered in Other Visits  Medication Dose Route Frequency Provider Last Rate  Last Admin   atropine 1 MG/ML injection            heparin lock flush 100 UNIT/ML injection             VITAL SIGNS: There were no vitals taken for this visit. There were no vitals filed for this visit.  Estimated body mass index is 18.67 kg/m as calculated from the following:   Height as of an earlier encounter on 09/16/23: 5\' 7"  (1.702 m).   Weight as of an earlier encounter on 09/16/23: 119 lb 3.2 oz (54.1 kg).  LABS: CBC:    Component Value Date/Time   WBC 2.9 (L) 09/16/2023 0918   HGB 12.5 (L) 09/16/2023 0918   HGB 10.0 (L) 08/17/2023 0829   HGB 14.2 02/08/2023 1130   HCT 38.5 (L) 09/16/2023 0918   HCT 41.0 02/08/2023 1130   PLT 123 (L) 09/16/2023 0918   PLT 75 (L) 08/17/2023 0829   PLT 112 (L) 02/08/2023 1130   MCV 95.8 09/16/2023 0918   MCV 95 02/08/2023 1130   MCV 88 08/18/2012 1321  NEUTROABS 1.8 09/16/2023 0918   NEUTROABS 7.4 (H) 02/08/2023 1130   LYMPHSABS 0.5 (L) 09/16/2023 0918   LYMPHSABS 0.4 (L) 02/08/2023 1130   MONOABS 0.5 09/16/2023 0918   EOSABS 0.1 09/16/2023 0918   EOSABS 0.0 02/08/2023 1130   BASOSABS 0.0 09/16/2023 0918   BASOSABS 0.0 02/08/2023 1130   Comprehensive Metabolic Panel:    Component Value Date/Time   NA 136 09/16/2023 0918   NA 137 02/08/2023 1130   NA 139 08/18/2012 1321   K 3.0 (L) 09/16/2023 0918   K 3.7 08/18/2012 1321   CL 101 09/16/2023 0918   CL 104 08/18/2012 1321   CO2 20 (L) 09/16/2023 0918   CO2 27 08/18/2012 1321   BUN 24 (H) 09/16/2023 0918   BUN 31 (H) 02/08/2023 1130   BUN 18 08/18/2012 1321   CREATININE 0.82 09/16/2023 0918   CREATININE 0.88 08/17/2023 0829   CREATININE 0.82 08/18/2012 1321   GLUCOSE 104 (H) 09/16/2023 0918   GLUCOSE 68 08/18/2012 1321   CALCIUM 8.8 (L) 09/16/2023 0918   CALCIUM 9.0 08/18/2012 1321   AST 24 09/16/2023 0918   AST 25 08/17/2023 0829   ALT 17 09/16/2023 0918   ALT 13 08/17/2023 0829   ALT 51 08/18/2012 1321   ALKPHOS 92 09/16/2023 0918   ALKPHOS 88 08/18/2012 1321    BILITOT 1.3 (H) 09/16/2023 0918   BILITOT 0.6 08/17/2023 0829   PROT 7.5 09/16/2023 0918   PROT 6.9 02/08/2023 1130   PROT 8.0 08/18/2012 1321   ALBUMIN 3.6 09/16/2023 0918   ALBUMIN 4.2 02/08/2023 1130   ALBUMIN 4.1 08/18/2012 1321    RADIOGRAPHIC STUDIES: No results found.  PERFORMANCE STATUS (ECOG) : 1 - Symptomatic but completely ambulatory  Review of Systems Unless otherwise noted, a complete review of systems is negative.  Physical Exam General: NAD Pulmonary: unlabored Extremities: no edema, no joint deformities Skin: no rashes Neurological: Weakness but otherwise nonfocal  IMPRESSION: Patient was an add-on to my clinic schedule today per Dr. Assunta Gambles request.  Patient has had disease progression on multiple lines of previous chemotherapy.  Last treatment on fourth line fruquintinib was associated with PRES.  Patient has not felt to have any additional treatment options available and best supportive care/hospice is being recommended.  I spoke with patient regarding goals.  He says that he understands that he has exhausted treatment options and is accepting of that news.  He says that he was initially estimated to have a prognosis of around 2 years at time of diagnosis and he has lived 2 additional years beyond that.  He states that he is in agreement with focusing on comfort.  He would like to try to remain home for as long as possible.  He has some friends that he will call upon to help if needed.  We did discuss hospice and patient would appreciate their involvement.    Of note, patient is being referred for possible XRT to the brain in light of recent MRI showing focal area of enhancement with surrounding vasogenic edema.  Patient understands that hospice would likely need to start after radiation has completed.  Symptomatically, he endorses some rib pain.  He had a recent fall.  No evidence of fracture on recent imaging.  Dr. Smith Robert has started patient on oxycodone.  MOST form  completed 2 years ago but patient confirms that he still would not desire any resuscitative efforts at end-of-life.  He agrees with DNR/DNI.  I signed a new DNR order  for him to take home today.  PLAN: -Best supportive care -Referral to hospice -Agree with oxycodone -DNR/DNI -Follow up telephone visit 2 weeks  Case and plan discussed with Dr. Smith Robert  Patient expressed understanding and was in agreement with this plan. He also understands that He can call the clinic at any time with any questions, concerns, or complaints.     Time Total: 15 minutes  Visit consisted of counseling and education dealing with the complex and emotionally intense issues of symptom management and palliative care in the setting of serious and potentially life-threatening illness.Greater than 50%  of this time was spent counseling and coordinating care related to the above assessment and plan.  Signed by: Laurette Schimke, PhD, NP-C

## 2023-09-16 NOTE — Progress Notes (Signed)
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 He currently smokes 0.5ppd. he presented to the ER with symptoms of left sided chest pain and left arm pain. Troponin was negative ekg was unremarkable. Ct chest showed no PE. He was incidentally noted to have mediastinal and retrocrural adenopathy and a 2.1X2.3X4.4 cm retroaortic soft tissue lesion in the posterior left chest. 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  Low-grade activity in the left lateral fifth and sixth ribs associated with nondisplaced fractures likely reflecting healing response problem malignancy.   Patient underwent CT-guided biopsy of the retroperitoneal lymph node pathology showed metastatic adenocarcinoma compatible with colorectal origin.  CK7 negative.  CK20 positive.  CDX 2+.  TTF-1 negative.  PSA negative.  This pattern of immunoreactivity supports the above diagnosis. Patient underwent colonoscopy which showed sigmoid mass that was consistent with adenocarcinoma.  RAS panel testing showed that he was wild-type for both K-ras and BRAF   FOLFOX and bevacizumab chemotherapy started in July 2020.  Subsequently oxaliplatin has been on hold given neuropathy.  He was switched to irinotecan Xeloda panitumumab regimen in August  2022.  Scans in October 2023  have shown slow increase in the size of intra-abdominal adenopathy but patient continues to have low-volume disease.  Patient received palliative radiation therapy to the enlarging external iliac lymph nodes.  Repeat lymph node biopsy could not be obtained due to location for foundation 1 testing.  Peripheral blood NGS testing did not show any actionable mutations.  Further disease progression noted in the intra-abdominal lymph nodes on PET scan and patient was switched to Lonsurf Avastin regimen in April 2024.  Disease progression in August 2024 with appearance of new intrahepatic metastases.  Also increase in size of primary sigmoid colon lesion.  CEA trending up.  No clinical trials available.  Patient was started on fourth line fruquintinib in early September 2024.  2 weeks following the start of drug patient presented to [facility] emergency room with seizures. MRI brain showed focal area of enhancement with small amount of vasogenic edema possibly metastases.  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  His blood pressure was controlled with multiple medications and fruquintinib was discontinued.  Interval history-patient has lost 20 pounds of weight since his last visit a month ago.  Food does not taste good.  He has not had any further episodes of seizures.He does report left-sided chest wall pain  ECOG PS- 2 Pain scale- 6 Opioid associated constipation- no  Review of systems- Review of Systems  Constitutional:  Positive for malaise/fatigue and weight loss. Negative for chills and fever.  HENT:  Negative for congestion, ear discharge and nosebleeds.   Eyes:  Negative for blurred vision.  Respiratory:  Negative for cough, hemoptysis, sputum production, shortness of breath and wheezing.        Right-sided chest wall pain  Cardiovascular:  Negative for chest pain, palpitations, orthopnea and claudication.  Gastrointestinal:  Negative for  abdominal pain, blood in stool, constipation, diarrhea, heartburn, melena, nausea and vomiting.  Genitourinary:  Negative for dysuria, flank pain, frequency, hematuria and urgency.  Musculoskeletal:  Negative for back pain, joint pain and myalgias.  Skin:  Negative for rash.  Neurological:  Negative for dizziness, tingling, focal weakness, seizures, weakness and headaches.  Endo/Heme/Allergies:  Does not bruise/bleed easily.  Psychiatric/Behavioral:  Negative for depression and suicidal ideas. The patient does not have insomnia.       No Known Allergies   Past Medical History:  Diagnosis Date   Colon cancer (HCC)    COPD (chronic obstructive pulmonary disease) (HCC)    Hyperlipidemia      Past Surgical History:  Procedure Laterality Date   COLONOSCOPY WITH PROPOFOL N/A 06/26/2019   Procedure: COLONOSCOPY WITH PROPOFOL;  Surgeon: Wyline Mood, MD;  Location: [facility] ENDOSCOPY;  Service: Gastroenterology;  Laterality: N/A;   ESOPHAGOGASTRODUODENOSCOPY (EGD) WITH PROPOFOL N/A 01/12/2023   Procedure: ESOPHAGOGASTRODUODENOSCOPY (EGD) WITH PROPOFOL;  Surgeon: Wyline Mood, MD;  Location: [facility] END[facility]COPY;  Service: Gastroenterology;  Laterality: N/A;   FLEXIBLE SIGMOIDOSCOPY N/A 05/07/2021   Procedure: FLEXIBLE SIGMOIDOSCOPY;  Surgeon: Wyline Mood, MD;  Location: [facility] ENDOSCOPY;  Service: Gastroenterology;  Laterality: N/A;   PORTA CATH INSERTION N/A 06/25/2019   Procedure: PORTA CATH INSERTION;  Surgeon: Annice Needy, MD;  Location: [facility] INVASIVE CV LAB;  Service: Cardiovascular;  Laterality: N/A;   PORTA CATH INSERTION Left 08/11/2020   Procedure: PORTA CATH INSERTION;  Surgeon: Annice Needy, MD;  Location: [facility] INVASIVE CV LAB;  Service: Cardiovascular;  Laterality: Left;   PORTA CATH REMOVAL Right 08/11/2020   Procedure: PORTA CATH REMOVAL;  Surgeon: Annice Needy, MD;  Location: [facility] INVASIVE CV LAB;  Service: Cardiovascular;  Laterality: Right;    Social History   Socioeconomic History    Marital status: Single    Spouse name: Not on file   Number of children: Not on file   Years of education: Not on file   Highest education level: Not on file  Occupational History   Not on file  Tobacco Use   Smoking status: Every Day    Current packs/day: 0.50    Average packs/day: 0.5 packs/day for 40.0 years (20.0 ttl pk-yrs)    Types: Cigarettes   Smokeless tobacco: Never   Tobacco comments:    smoking 40 years  Vaping Use   Vaping status: Never Used  Substance and Sexual Activity   Alcohol use: No   Drug use: No   Sexual activity: Not Currently  Other Topics Concern   Not on file  Social History Narrative   Not on file   Social Determinants of Health   Financial Resource Strain: Low Risk  (03/02/2023)   Received from [facility]   Overall Financial Resource Strain (CARDIA)    Difficulty of Paying Living Expenses: Not very hard  Food Insecurity: No Food Insecurity (03/02/2023)   Received from [facility]   Hunger Vital Sign    Worried About Running Out of Food in the Last Year: Never true    Ran Out of Food in the Last Year: Never true  Transportation Needs: No Transportation Needs (08/28/2023)   Received from [facility] - Transportation    In the past 12 months, has lack of transportation kept you from medical appointments or from getting medications?: No  Lack of Transportation (Non-Medical): No  Physical Activity: Not on file  Stress: No Stress Concern Present (06/12/2019)   [employer] of Occupational Health - Occupational Stress Questionnaire    Feeling of Stress : Not at all  Social Connections: Unknown (06/12/2019)   Social Connection and Isolation Panel [NHANES]    Frequency of Communication with Friends and Family: More than three times a week    Frequency of Social Gatherings with Friends and Family: Not on file    Attends Religious Services: Not on file    Active Member of Clubs  or Organizations: Not on file    Attends Banker Meetings: Not on file    Marital Status: Not on file  Intimate Partner Violence: Not At Risk (06/12/2019)   Humiliation, Afraid, Rape, and Kick questionnaire    Fear of Current or Ex-Partner: No    Emotionally Abused: No    Physically Abused: No    Sexually Abused: No    Family History  Problem Relation Age of Onset   Brain cancer Father      Current Outpatient Medications:    aspirin 81 MG chewable tablet, Chew by mouth., Disp: , Rfl:    cetirizine (ZYRTEC) 10 MG tablet, TAKE 1 TABLET BY MOUTH DAILY, Disp: 90 tablet, Rfl: 0   cyclobenzaprine (FLEXERIL) 10 MG tablet, TAKE 1 TABLET(10 MG) BY MOUTH THREE TIMES DAILY AS NEEDED FOR MUSCLE SPASMS, Disp: 42 tablet, Rfl: 0   dexlansoprazole (DEXILANT) 60 MG capsule, Take 1 capsule (60 mg total) by mouth every morning., Disp: 90 capsule, Rfl: 1   DULoxetine (CYMBALTA) 30 MG capsule, Take 1 capsule (30 mg total) by mouth 2 (two) times daily., Disp: 60 capsule, Rfl: 1   lidocaine-prilocaine (EMLA) cream, Apply 1 Application topically as needed (pt will put 1 inch of cream over port 1 hourfor each treatment, cover plastic over the cream protect clothes)., Disp: 30 g, Rfl: 3   lisinopril (ZESTRIL) 5 MG tablet, Take 1 tablet (5 mg total) by mouth daily., Disp: 90 tablet, Rfl: 1   pregabalin (LYRICA) 75 MG capsule, TAKE 1 CAPSULE BY MOUTH EVERY MORNING AND TAKE 2 CAPSULES AT BEDTIME, Disp: 90 capsule, Rfl: 1   simvastatin (ZOCOR) 20 MG tablet, Take 1 tablet (20 mg total) by mouth every evening., Disp: 90 tablet, Rfl: 1   tamsulosin (FLOMAX) 0.4 MG CAPS capsule, Take 0.4 mg by mouth. , Disp: , Rfl:    azelastine (ASTELIN) 0.1 % nasal spray, Place 1 spray into both nostrils daily as needed. (Patient not taking: Reported on 09/16/2023), Disp: , Rfl:    budesonide-formoterol (SYMBICORT) 80-4.5 MCG/ACT inhaler, USE 2 INHALATION BY MOUTH TWICE DAILY (Patient not taking: Reported on 09/16/2023),  Disp: 10.2 g, Rfl: 2   lactulose (CHRONULAC) 10 GM/15ML solution, Take 15 mLs (10 g total) by mouth 3 (three) times daily as needed for mild constipation. (Patient not taking: Reported on 09/16/2023), Disp: 236 mL, Rfl: 1   montelukast (SINGULAIR) 10 MG tablet, Take 10 mg by mouth at bedtime. (Patient not taking: Reported on 09/16/2023), Disp: , Rfl:    nicotine (NICODERM CQ - DOSED IN MG/24 HOURS) 21 mg/24hr patch, Place onto the skin. (Patient not taking: Reported on 09/16/2023), Disp: , Rfl:    oxyCODONE (OXY IR/ROXICODONE) 5 MG immediate release tablet, Take 1 tablet (5 mg total) by mouth every 4 (four) hours as needed for severe pain., Disp: 120 tablet, Rfl: 0   triamcinolone (NASACORT ALLERGY 24HR) 55 MCG/ACT AERO nasal inhaler, Place  2 sprays into the nose daily. (Patient not taking: Reported on 09/16/2023), Disp: 60 each, Rfl: 2 No current facility-administered medications for this visit.  Facility-Administered Medications Ordered in Other Visits:    atropine 1 MG/ML injection, , , ,    heparin lock flush 100 UNIT/ML injection, , , ,   Physical exam:  Vitals:   09/16/23 0932  BP: 118/78  Pulse: (!) 107  Resp: 17  Temp: (!) 94.9 F (34.9 C)  TempSrc: Tympanic  SpO2: 100%  Weight: 119 lb 3.2 oz (54.1 kg)  Height: 5\' 7"  (1.702 m)   Physical Exam Constitutional:      Comments: Appears fatigued and cachectic.  No acute distress  Cardiovascular:     Rate and Rhythm: Normal rate and regular rhythm.     Heart sounds: Normal heart sounds.  Pulmonary:     Effort: Pulmonary effort is normal.     Breath sounds: Normal breath sounds.  Abdominal:     General: Bowel sounds are normal.     Palpations: Abdomen is soft.  Skin:    General: Skin is warm and dry.  Neurological:     Mental Status: He is alert and oriented to person, place, and time.         Latest Ref Rng & Units 09/16/2023    9:18 AM  CMP  Glucose 70 - 99 mg/dL 025   BUN 8 - 23 mg/dL 24   Creatinine 4.27 - 1.24 mg/dL  0.62   Sodium 376 - 283 mmol/L 136   Potassium 3.5 - 5.1 mmol/L 3.0   Chloride 98 - 111 mmol/L 101   CO2 22 - 32 mmol/L 20   Calcium 8.9 - 10.3 mg/dL 8.8   Total Protein 6.5 - 8.1 g/dL 7.5   Total Bilirubin 0.3 - 1.2 mg/dL 1.3   Alkaline Phos 38 - 126 U/L 92   AST 15 - 41 U/L 24   ALT 0 - 44 U/L 17       Latest Ref Rng & Units 09/16/2023    9:18 AM  CBC  WBC 4.0 - 10.5 K/uL 2.9   Hemoglobin 13.0 - 17.0 g/dL 15.1   Hematocrit 76.1 - 52.0 % 38.5   Platelets 150 - 400 K/uL 123     No images are attached to the encounter.  No results found.   Assessment and plan- Patient is a 72 y.o. male with metastatic adenocarcinoma of the colon with liver and lymph node metastases here to discuss further management  Patient has had progression on multiple lines of chemotherapy and was started on fourth line fruquintinib a month ago.  He presented with symptoms of seizure-like activity as well as posterior reversible encephalopathy syndrome.  This can be seen in less than 1% of patients were started on fruquintinib.  I do not plan to rechallenge him with this drug.  Another possible treatment option for him will be Stivarga but even that drug is associated with possible risk of posterior reversible encephalopathy syndrome.  We do not have any other targetable drugs for the patient.  Overall he is not doing well and has lost considerable amount of weight and has had decline in his performance status.  I therefore recommend proceeding with best supportive care/hospice at this time.  Patient will be seen by palliative care today to discuss his CODE STATUS and home hospice referral will be made.  MRI brain that was done at [facility] showed focal area of enhancement along with surrounding vasogenic  edema concerning for possible metastatic focus and I would like to refer him to radiation oncology for the same to see if there would be any role for SRS to stabilize that lesion.  No follow-up needed with me at this  time  Patient reports right-sided chest wall pain after he had a fall.  Imaging studies done at [facility] do not reveal any fracture.  I am sending him a prescription for oxycodone 5 mg every 4 hours as needed given that we are focusing on his end-of-life goals and comfort at this time   Visit Diagnosis 1. 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)   2. Goals of care, counseling/discussion      Dr. Owens Shark, MD, MPH [facility] at [facility] [phone] 09/16/2023 12:11 PM

## 2023-09-17 LAB — CEA: CEA: 13.2 ng/mL — ABNORMAL HIGH (ref 0.0–4.7)

## 2023-09-19 ENCOUNTER — Other Ambulatory Visit: Payer: Self-pay

## 2023-09-19 ENCOUNTER — Telehealth: Payer: Self-pay

## 2023-09-19 NOTE — Telephone Encounter (Signed)
Contacted [employer] regarding the P2P received. They stated there is nothing to do on our end. This would just be a copy of something that they have also received. This patient is receiving financial assistance and will not be receiving a bill for this testing. No P2P required.

## 2023-09-19 NOTE — Telephone Encounter (Signed)
Thank you! I knew you would know something about this.

## 2023-09-19 NOTE — Progress Notes (Signed)
Disenrolled patient from Transport planner. Per OV notes on 09/16/23, Patient has not felt to have any additional treatment options available and best supportive care/hospice is being recommended.

## 2023-09-20 ENCOUNTER — Encounter: Payer: Self-pay | Admitting: Radiation Oncology

## 2023-09-20 ENCOUNTER — Telehealth: Payer: Self-pay | Admitting: *Deleted

## 2023-09-20 ENCOUNTER — Ambulatory Visit
Admission: RE | Admit: 2023-09-20 | Discharge: 2023-09-20 | Disposition: A | Discharge status: Home / Self Care | Payer: Medicare HMO | Source: Ambulatory Visit | Attending: Radiation Oncology | Admitting: Radiation Oncology

## 2023-09-20 VITALS — BP 136/86 | HR 85 | Temp 97.7°F | Resp 16 | Wt 121.0 lb

## 2023-09-20 DIAGNOSIS — G9349 Other encephalopathy: Secondary | ICD-10-CM | POA: Insufficient documentation

## 2023-09-20 DIAGNOSIS — C772 Secondary and unspecified malignant neoplasm of intra-abdominal lymph nodes: Secondary | ICD-10-CM

## 2023-09-20 DIAGNOSIS — C7931 Secondary malignant neoplasm of brain: Secondary | ICD-10-CM | POA: Insufficient documentation

## 2023-09-20 DIAGNOSIS — C189 Malignant neoplasm of colon, unspecified: Secondary | ICD-10-CM | POA: Diagnosis not present

## 2023-09-20 DIAGNOSIS — C778 Secondary and unspecified malignant neoplasm of lymph nodes of multiple regions: Secondary | ICD-10-CM | POA: Diagnosis not present

## 2023-09-20 NOTE — Telephone Encounter (Signed)
Called pt per josh. He has been trying to get in touch with the pt. And no answer. I got the voicemail also. I left message stting that I wanted to check on pt. I know that you wanted to do hospice but wait 1-2 weeks. I believe  that hospice has been calling but can't get in touch with him. If he can call me so I know what to do to help for anything to help.left my phone number to call me back

## 2023-09-20 NOTE — Progress Notes (Signed)
Radiation Oncology Follow up Note  Name: Eric Simon   Date:   09/20/2023 MRN:  [id number] DOB: 11/10/1951    This 72 y.o. male presents to the clinic today for Evaluation of palliative radiation therapy to his brain in a patient with known stage IV colon cancer with progressive retroperitoneal and pelvic lymph nodes  REFERRING PROVIDER: [name], MD  HPI: Patient is a 72 year old male previously treated back in 1 year prior with palliative ration therapy to his lower lumbar spine and left hypermetabolic pelvic lymph nodes.[name]  He recently had a seizure fracture some right lateral ribs.  MRI scan at [facility] showed posterior reversible encephalopathy syndrome.  Also had a small contrast-enhancing focus with small amount of adjacent vasogenic edema consistent with possible metastasis which may serve as the source of his reported seizure activity.  Seen today his specifically denies any focal neurologic deficits any change in his visual field.  COMPLICATIONS OF TREATMENT: none  FOLLOW UP COMPLIANCE: keeps appointments   PHYSICAL EXAM:  BP 136/86   Pulse 85   Temp 97.7 F (36.5 C) (Tympanic)   Resp 16   Wt 121 lb (54.9 kg)   BMI 18.95 kg/m  Thin cachectic male in NAD crude visual fields within normal range motor or sensory and DTR levels are equal and symmetric in upper and lower extremities.  Cranial nerves are intact.  Well-developed well-nourished patient in NAD. HEENT reveals PERLA, EOMI, discs not visualized.  Oral cavity is clear. No oral mucosal lesions are identified. Neck is clear without evidence of cervical or supraclavicular adenopathy. Lungs are clear to A&P. Cardiac examination is essentially unremarkable with regular rate and rhythm without murmur rub or thrill. Abdomen is benign with no organomegaly or masses noted. Motor sensory and DTR levels are equal and symmetric in the upper and lower extremities. Cranial nerves II through XII are grossly intact. Proprioception  is intact. No peripheral adenopathy or edema is identified. No motor or sensory levels are noted. Crude visual fields are within normal range.  RADIOLOGY RESULTS: PET scan reviewed compatible with above-stated findings MRI from [facility] has been requested for my review  PLAN: At the present time we will go ahead with a partial brain field targeting the small contrast-enhancing focus and treat to 30 Gray in 5 fractions using IMRT treatment planning and delivery.  Risks and benefits of treatment occluding possible hair loss fatigue alteration blood counts and skin reaction all were reviewed in detail with the patient.  I have set him up for simulation later this week hopefully will have the films from [facility] here for my review and I will plan on doing an MRI fusion study.  I would like to take this opportunity to thank you for allowing me to participate in the care of your patient.[name], MD

## 2023-09-22 ENCOUNTER — Ambulatory Visit: Payer: Medicare HMO

## 2023-09-22 ENCOUNTER — Other Ambulatory Visit: Payer: Self-pay | Admitting: Radiation Oncology

## 2023-09-22 ENCOUNTER — Inpatient Hospital Stay
Admission: RE | Admit: 2023-09-22 | Discharge: 2023-09-22 | Disposition: A | Discharge status: Home / Self Care | Payer: Self-pay | Source: Ambulatory Visit | Attending: Radiation Oncology | Admitting: Radiation Oncology

## 2023-09-22 ENCOUNTER — Other Ambulatory Visit: Payer: Self-pay | Admitting: *Deleted

## 2023-09-22 DIAGNOSIS — C189 Malignant neoplasm of colon, unspecified: Secondary | ICD-10-CM

## 2023-09-22 DIAGNOSIS — C7931 Secondary malignant neoplasm of brain: Secondary | ICD-10-CM

## 2023-09-23 ENCOUNTER — Ambulatory Visit
Admission: RE | Admit: 2023-09-23 | Discharge: 2023-09-23 | Disposition: A | Discharge status: Home / Self Care | Payer: Medicare HMO | Source: Ambulatory Visit | Attending: Radiation Oncology | Admitting: Radiation Oncology

## 2023-09-23 ENCOUNTER — Ambulatory Visit: Payer: Medicare HMO

## 2023-09-23 DIAGNOSIS — C189 Malignant neoplasm of colon, unspecified: Secondary | ICD-10-CM | POA: Insufficient documentation

## 2023-09-23 DIAGNOSIS — C7931 Secondary malignant neoplasm of brain: Secondary | ICD-10-CM | POA: Insufficient documentation

## 2023-09-26 ENCOUNTER — Other Ambulatory Visit: Payer: Self-pay

## 2023-09-26 ENCOUNTER — Telehealth: Payer: Self-pay | Admitting: *Deleted

## 2023-09-26 ENCOUNTER — Other Ambulatory Visit: Payer: Self-pay | Admitting: *Deleted

## 2023-09-26 ENCOUNTER — Inpatient Hospital Stay (HOSPITAL_BASED_OUTPATIENT_CLINIC_OR_DEPARTMENT_OTHER): Payer: Medicare HMO | Admitting: Hospice and Palliative Medicine

## 2023-09-26 DIAGNOSIS — C189 Malignant neoplasm of colon, unspecified: Secondary | ICD-10-CM | POA: Diagnosis not present

## 2023-09-26 DIAGNOSIS — C772 Secondary and unspecified malignant neoplasm of intra-abdominal lymph nodes: Secondary | ICD-10-CM

## 2023-09-26 DIAGNOSIS — Z515 Encounter for palliative care: Secondary | ICD-10-CM

## 2023-09-26 NOTE — Telephone Encounter (Signed)
Attempted to contact patient regarding the cancellation of his treatments and the results of his [facility] scan per Dr. Rushie Chestnut,  left detailed message times two and mailed the AVS with his upcoming appointments. Included call back number should he have questions.

## 2023-09-26 NOTE — Progress Notes (Signed)
Virtual Visit via Telephone Note  I connected with Eric Simon on 09/26/23 at  2:20 PM EDT by telephone and verified that I am speaking with the correct person using two identifiers.  Location: Patient: Home Provider: Clinic   I discussed the limitations, risks, security and privacy concerns of performing an evaluation and management service by telephone and the availability of in person appointments. I also discussed with the patient that there may be a patient responsible charge related to this service. The patient expressed understanding and agreed to proceed.   History of Present Illness: Eric Simon is a 72 y.o. 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   He was referred to palliative care to discuss goals and manage ongoing symptoms.    Observations/Objective: Patient seen by Dr. Smith Robert and me on 09/16/23.  At that time, patient was felt to have exhausted options and hospice was recommended.  However, patient subsequently had follow-up with radiation oncology for possible XRT to the brain.  Case discussed with Dr. Rushie Chestnut and Dr. Smith Robert.  There does not seem to be a indication for XRT at this time.  I called address goals with patient.  He confirms a desire to stay home and focus on comfort.  He verbalized agreement with proceeding with hospice referral.  Assessment and Plan: Stage IV colorectal cancer -best supportive care.  Proceed with hospice referral.  Follow Up Instructions: As needed   I discussed the assessment and treatment plan with the patient. The patient was provided an opportunity to ask questions and all were answered. The patient agreed with the plan and demonstrated an understanding of the instructions.   The patient was advised to call back or seek an in-person evaluation if the symptoms worsen or if the condition fails to  improve as anticipated.  I provided 10 minutes of non-face-to-face time during this encounter.   Malachy Moan, NP

## 2023-09-27 ENCOUNTER — Encounter: Payer: Self-pay | Admitting: Oncology

## 2023-09-28 ENCOUNTER — Telehealth: Payer: Self-pay | Admitting: *Deleted

## 2023-09-28 NOTE — Telephone Encounter (Signed)
Msg left for patient and emergency contact Aneta Mins. [facility] has been attempting to reach pt to set up a site visit time to discuss hospice enrollment. Hospice liaison has been unable to get in touch with patient. I also sent a mychart msg to patient as well.

## 2023-09-30 ENCOUNTER — Telehealth: Payer: Medicare HMO | Admitting: Hospice and Palliative Medicine

## 2023-09-30 ENCOUNTER — Ambulatory Visit: Payer: Medicare HMO | Admitting: Internal Medicine

## 2023-10-03 ENCOUNTER — Ambulatory Visit: Payer: Medicare HMO

## 2023-10-04 ENCOUNTER — Ambulatory Visit: Payer: Medicare HMO

## 2023-10-05 ENCOUNTER — Ambulatory Visit: Payer: Medicare HMO

## 2023-10-06 ENCOUNTER — Ambulatory Visit: Payer: Medicare HMO

## 2023-10-07 ENCOUNTER — Ambulatory Visit: Payer: Medicare HMO

## 2023-10-10 ENCOUNTER — Telehealth: Payer: Self-pay | Admitting: Gastroenterology

## 2023-10-10 ENCOUNTER — Ambulatory Visit: Payer: Medicare HMO

## 2023-10-10 NOTE — Telephone Encounter (Signed)
The patient called in to schedule an appointment. I sent out an appointment reminder with a No-Show letter and a map. I verified and updated the information in his/her chart, including the insurance guarantor and other relevant details.

## 2023-10-11 ENCOUNTER — Other Ambulatory Visit: Payer: Self-pay

## 2023-10-11 ENCOUNTER — Telehealth: Payer: Self-pay

## 2023-10-11 ENCOUNTER — Ambulatory Visit: Payer: Medicare HMO | Admitting: Gastroenterology

## 2023-10-11 NOTE — Telephone Encounter (Signed)
Patient was contacted after Dr. Tobi Bastos reviewed his chart and stated that patient is under hospice care. Therefore, I called him and had to leave him a voicemail letting him know that he will have to call his hospice nurse and let them know what symptoms he is currently having.

## 2023-11-04 ENCOUNTER — Ambulatory Visit: Payer: Medicare HMO | Admitting: Internal Medicine

## 2023-11-25 ENCOUNTER — Other Ambulatory Visit: Payer: Self-pay | Admitting: Hospice and Palliative Medicine

## 2023-11-25 MED ORDER — LORAZEPAM 0.5 MG PO TABS: 0.5000 mg | ORAL_TABLET | Freq: Three times a day (TID) | ORAL | 0 refills | Status: DC

## 2023-11-25 MED ORDER — MORPHINE SULFATE (CONCENTRATE) 10 MG /0.5 ML PO SOLN
10.0000 mg | ORAL | 0 refills | Status: DC | PRN
Start: 2023-11-25 — End: 2023-11-28

## 2023-11-25 NOTE — Progress Notes (Signed)
Received a call from hospice nurse, Amy.  Patient declining now having signs of active dying process.  Patient was started on methadone by hospice team.  Has additionally continued to take oxycodone.  However, now is no longer able to swallow medications.  Will start morphine elixir and send Rx for lorazepam.

## 2023-12-14 DEATH — deceased

## 2023-12-27 ENCOUNTER — Ambulatory Visit: Payer: Medicare HMO

## 2024-01-11 ENCOUNTER — Ambulatory Visit: Payer: Medicare HMO | Admitting: Radiation Oncology

## 2024-09-18 NOTE — Progress Notes (Signed)
 Assessment & Plan:  1. SOB (shortness of breath) (Primary)   Patient Instructions  1.  We will schedule breathing tests.  2.  We will see him in follow-up in 6 to 8 weeks time.  Please note: late entry documentation due to logistical difficulties during COVID-19 pandemic. This note is filed for information purposes only, and is not intended to be used for billing, nor does it represent the full scope/nature of the visit in question. Please see any associated scanned media linked to date of encounter for additional pertinent information.  Subjective:    HPI: Eric Simon is a 73 y.o. male presenting to the pulmonology clinic on 08/08/2019 with report of: pulmonary consult (per Dr. Geofm for COPD- Pt reports of non prod cough, head congestion & sob with exertion)     Outpatient Encounter Medications as of 08/08/2019  Medication Sig Note   montelukast  (SINGULAIR ) 10 MG tablet Take 10 mg by mouth at bedtime. (Patient not taking: Reported on 09/16/2023)    tamsulosin  (FLOMAX ) 0.4 MG CAPS capsule Take 0.4 mg by mouth.     [DISCONTINUED] aspirin  EC 81 MG tablet Take 81 mg by mouth daily. (Patient not taking: Reported on 09/16/2023)    [DISCONTINUED] Budesonide -Formoterol  Fumarate (SYMBICORT  IN) Inhale 1 puff into the lungs daily as needed.     [DISCONTINUED] cetirizine  (ZYRTEC ) 10 MG tablet Take 1 tablet (10 mg total) by mouth daily.    [DISCONTINUED] cyclobenzaprine  (FLEXERIL ) 5 MG tablet Take 1 tablet (5 mg total) by mouth 3 (three) times daily as needed for muscle spasms.    [DISCONTINUED] dexamethasone  (DECADRON ) 4 MG tablet Take 2 tablets (8 mg total) by mouth daily. Start the day after chemotherapy for 2 days. Take with food.    [DISCONTINUED] fluticasone  (VERAMYST) 27.5 MCG/SPRAY nasal spray Place 2 sprays into the nose daily as needed.    [DISCONTINUED] lidocaine -prilocaine  (EMLA ) cream Apply to affected area once    [DISCONTINUED] magic mouthwash w/lidocaine  SOLN Take 5-10  mLs by mouth 4 (four) times daily as needed for mouth pain.    [DISCONTINUED] nystatin  (MYCOSTATIN ) 100000 UNIT/ML suspension Take 5 mLs (500,000 Units total) by mouth 3 (three) times daily. Swish and Spit (Patient not taking: Reported on 11/19/2019)    [DISCONTINUED] ondansetron  (ZOFRAN ) 8 MG tablet Take 1 tablet (8 mg total) by mouth 2 (two) times daily as needed for refractory nausea / vomiting. Start on day 3 after chemotherapy.    [DISCONTINUED] pantoprazole  (PROTONIX ) 40 MG tablet Take 40 mg by mouth daily.     [DISCONTINUED] potassium chloride  SA (K-DUR) 20 MEQ tablet Take 1 tablet (20 mEq total) by mouth daily.    [DISCONTINUED] prochlorperazine  (COMPAZINE ) 10 MG tablet Take 1 tablet (10 mg total) by mouth every 6 (six) hours as needed (Nausea or vomiting).    [DISCONTINUED] simvastatin  (ZOCOR ) 20 MG tablet Take 20 mg by mouth daily.     [DISCONTINUED] traMADol -acetaminophen  (ULTRACET ) 37.5-325 MG tablet Take 1 tablet by mouth every 6 (six) hours. 01/28/2020: i called pt and he no loner takes this med, ran out a long time ago per pt   No facility-administered encounter medications on file as of 08/08/2019.      Objective:   Vitals:   08/08/19 1002  BP: 122/74  Pulse: 99  Temp: (!) 97.5 F (36.4 C)  Height: 5' 7 (1.702 m)  Weight: 141 lb 9.6 oz (64.2 kg)  SpO2: 100%  TempSrc: Temporal  BMI (Calculated): 22.17     Physical exam  documentation is limited by delayed entry of information.
